# Patient Record
Sex: Female | Born: 1940
Health system: Southern US, Community
[De-identification: ages and names within clinical notes are randomized; demographics above are authoritative.]

## PROBLEM LIST (undated history)

## (undated) DIAGNOSIS — E119 Type 2 diabetes mellitus without complications: Secondary | ICD-10-CM

## (undated) DIAGNOSIS — I739 Peripheral vascular disease, unspecified: Secondary | ICD-10-CM

## (undated) DIAGNOSIS — C50919 Malignant neoplasm of unspecified site of unspecified female breast: Secondary | ICD-10-CM

## (undated) DIAGNOSIS — E785 Hyperlipidemia, unspecified: Secondary | ICD-10-CM

## (undated) DIAGNOSIS — K760 Fatty (change of) liver, not elsewhere classified: Secondary | ICD-10-CM

## (undated) DIAGNOSIS — I214 Non-ST elevation (NSTEMI) myocardial infarction: Secondary | ICD-10-CM

## (undated) DIAGNOSIS — I2510 Atherosclerotic heart disease of native coronary artery without angina pectoris: Secondary | ICD-10-CM

## (undated) DIAGNOSIS — I779 Disorder of arteries and arterioles, unspecified: Secondary | ICD-10-CM

## (undated) DIAGNOSIS — E039 Hypothyroidism, unspecified: Secondary | ICD-10-CM

## (undated) DIAGNOSIS — Q453 Other congenital malformations of pancreas and pancreatic duct: Secondary | ICD-10-CM

## (undated) DIAGNOSIS — I639 Cerebral infarction, unspecified: Secondary | ICD-10-CM

## (undated) DIAGNOSIS — I10 Essential (primary) hypertension: Secondary | ICD-10-CM

## (undated) HISTORY — DX: Peripheral vascular disease, unspecified: I73.9

## (undated) HISTORY — DX: Hypothyroidism, unspecified: E03.9

## (undated) HISTORY — DX: Malignant neoplasm of unspecified site of unspecified female breast: C50.919

## (undated) HISTORY — DX: Disorder of arteries and arterioles, unspecified: I77.9

## (undated) HISTORY — PX: CAROTID ENDARTERECTOMY: SUR193

## (undated) HISTORY — DX: Fatty (change of) liver, not elsewhere classified: K76.0

## (undated) HISTORY — PX: JOINT REPLACEMENT: SHX530

## (undated) HISTORY — DX: Hyperlipidemia, unspecified: E78.5

## (undated) HISTORY — DX: Cerebral infarction, unspecified: I63.9

## (undated) HISTORY — PX: ABDOMINAL HYSTERECTOMY: SHX81

## (undated) HISTORY — DX: Type 2 diabetes mellitus without complications: E11.9

## (undated) HISTORY — PX: CHOLECYSTECTOMY: SHX55

## (undated) HISTORY — PX: MASTECTOMY: SHX3

## (undated) HISTORY — DX: Essential (primary) hypertension: I10

## (undated) HISTORY — DX: Other congenital malformations of pancreas and pancreatic duct: Q45.3

## (undated) HISTORY — DX: Non-ST elevation (NSTEMI) myocardial infarction: I21.4

## (undated) HISTORY — DX: Atherosclerotic heart disease of native coronary artery without angina pectoris: I25.10

---

## 2010-03-19 ENCOUNTER — Inpatient Hospital Stay (HOSPITAL_COMMUNITY)
Admission: AD | Admit: 2010-03-19 | Discharge: 2010-03-22 | DRG: 247 | Disposition: A | Discharge status: Home / Self Care | Payer: Medicare (Managed Care) | Source: Other Acute Inpatient Hospital | Attending: Cardiology | Admitting: Cardiology

## 2010-03-19 DIAGNOSIS — E039 Hypothyroidism, unspecified: Secondary | ICD-10-CM | POA: Diagnosis present

## 2010-03-19 DIAGNOSIS — I739 Peripheral vascular disease, unspecified: Secondary | ICD-10-CM | POA: Diagnosis present

## 2010-03-19 DIAGNOSIS — E119 Type 2 diabetes mellitus without complications: Secondary | ICD-10-CM | POA: Diagnosis present

## 2010-03-19 DIAGNOSIS — I10 Essential (primary) hypertension: Secondary | ICD-10-CM | POA: Diagnosis present

## 2010-03-19 DIAGNOSIS — I5181 Takotsubo syndrome: Secondary | ICD-10-CM | POA: Diagnosis present

## 2010-03-19 DIAGNOSIS — Z853 Personal history of malignant neoplasm of breast: Secondary | ICD-10-CM

## 2010-03-19 DIAGNOSIS — R079 Chest pain, unspecified: Secondary | ICD-10-CM

## 2010-03-19 DIAGNOSIS — I214 Non-ST elevation (NSTEMI) myocardial infarction: Principal | ICD-10-CM | POA: Diagnosis present

## 2010-03-19 DIAGNOSIS — Z8673 Personal history of transient ischemic attack (TIA), and cerebral infarction without residual deficits: Secondary | ICD-10-CM

## 2010-03-19 DIAGNOSIS — E669 Obesity, unspecified: Secondary | ICD-10-CM | POA: Diagnosis present

## 2010-03-19 DIAGNOSIS — I2510 Atherosclerotic heart disease of native coronary artery without angina pectoris: Secondary | ICD-10-CM | POA: Diagnosis present

## 2010-03-19 LAB — CARDIAC PANEL(CRET KIN+CKTOT+MB+TROPI)
CK, MB: 9.7 ng/mL (ref 0.3–4.0)
Relative Index: 3 — ABNORMAL HIGH (ref 0.0–2.5)
Total CK: 326 U/L — ABNORMAL HIGH (ref 7–177)

## 2010-03-20 DIAGNOSIS — I2510 Atherosclerotic heart disease of native coronary artery without angina pectoris: Secondary | ICD-10-CM

## 2010-03-20 DIAGNOSIS — I517 Cardiomegaly: Secondary | ICD-10-CM

## 2010-03-20 LAB — BASIC METABOLIC PANEL
BUN: 16 mg/dL (ref 6–23)
CO2: 24 mEq/L (ref 19–32)
Chloride: 100 mEq/L (ref 96–112)
Creatinine, Ser: 1.04 mg/dL (ref 0.4–1.2)
GFR calc Af Amer: >60 mL/min (ref >60)
Glucose, Bld: 129 mg/dL — ABNORMAL HIGH (ref 70–99)
Sodium: 136 mEq/L (ref 135–145)

## 2010-03-20 LAB — LIPID PANEL
Cholesterol: 179 mg/dL (ref 0–200)
HDL: 40 mg/dL (ref >39)
Total CHOL/HDL Ratio: 4.5 RATIO
VLDL: 21 mg/dL (ref 0–40)

## 2010-03-20 LAB — PROTIME-INR
INR: 1.09 (ref 0.00–1.49)
Prothrombin Time: 14.3 seconds (ref 11.6–15.2)

## 2010-03-20 LAB — CBC
MCH: 25.6 pg — ABNORMAL LOW (ref 26.0–34.0)
MCHC: 32.7 g/dL (ref 30.0–36.0)
Platelets: 193 10*3/uL (ref 150–400)
RBC: 4.22 MIL/uL (ref 3.87–5.11)

## 2010-03-20 LAB — HEPATIC FUNCTION PANEL
Albumin: 3.5 g/dL (ref 3.5–5.2)
Bilirubin, Direct: <0.1 mg/dL (ref 0.0–0.3)
Total Bilirubin: 0.4 mg/dL (ref 0.3–1.2)

## 2010-03-20 LAB — CARDIAC PANEL(CRET KIN+CKTOT+MB+TROPI)
CK, MB: 7.3 ng/mL (ref 0.3–4.0)
Total CK: 269 U/L — ABNORMAL HIGH (ref 7–177)
Troponin I: 1.09 ng/mL (ref 0.00–0.06)

## 2010-03-21 LAB — CBC
Hemoglobin: 11.4 g/dL — ABNORMAL LOW (ref 12.0–15.0)
MCH: 25.9 pg — ABNORMAL LOW (ref 26.0–34.0)
MCV: 78.6 fL (ref 78.0–100.0)
RBC: 4.4 MIL/uL (ref 3.87–5.11)
WBC: 10.7 10*3/uL — ABNORMAL HIGH (ref 4.0–10.5)

## 2010-03-21 LAB — BASIC METABOLIC PANEL
BUN: 22 mg/dL (ref 6–23)
CO2: 27 mEq/L (ref 19–32)
Chloride: 103 mEq/L (ref 96–112)
Creatinine, Ser: 1.2 mg/dL (ref 0.4–1.2)
Potassium: 3.7 mEq/L (ref 3.5–5.1)

## 2010-03-21 NOTE — Procedures (Signed)
NAME:  Robin [name], Robin [name]              ACCOUNT NO.:  [id number]  MEDICAL RECORD NO.:  [id number]           PATIENT TYPE:  O  LOCATION:  [facility]                         FACILITY:  [facility]  PHYSICIAN:  Verne Carrow, MDDATE OF BIRTH:  [birth year]  DATE OF PROCEDURE:  03/20/2010 DATE OF DISCHARGE:                           CARDIAC CATHETERIZATION   PRIMARY CARDIOLOGIST:  Marca Ancona, MD  PROCEDURES PERFORMED: 1. Left heart catheterization. 2. Selective coronary angiography. 3. Left ventricular angiogram. 4. Percutaneous transluminal coronary angioplasty with placement of a     drug-eluting stent in the second diagonal branch of the left     anterior descending artery.  ARTERIAL ACCESS SITE:  Right radial artery.  OPERATOR:  Verne Carrow, MD  INDICATION:  This is a pleasant 70 year old African American female with a history of hypertension, hypercholesterolemia, breast cancer, hypothyroidism, and prior stroke in 2000 who was admitted to the hospital with chest pain.  She ruled in for myocardial infarction with serial cardiac enzymes.  Her peak troponin was 1.3.  Diagnostic catheterization was planned for today.  DETAILS OF PROCEDURE:  The patient was brought to the main cardiac catheterization laboratory after signing informed consent for the procedure.  An Freida Busman test was performed on the right wrist and was positive.  The right wrist was prepped and draped in sterile fashion. 1% lidocaine was used for local anesthesia.  A 5-French sheath was inserted into the right radial artery without difficulty.  3 mg of verapamil was given after sheath insertion.  4000 units of intravenous heparin was given after sheath insertion.  Standard diagnostic catheters were used to perform selective coronary angiography.  A pigtail catheter was used to cross the aortic valve into the left ventricle.  The patient's filling pressures were high and because of this I elected not to do a power  injection for left ventricular angiogram.  I performed a small hand injection with poor opacification of the left ventricle.  A pullback was done across the aortic valve.  There was no significant gradient across the aortic valve.  The patient tolerated the diagnostic portion of the procedure without any difficulties.  At this point, we elected to move toward intervention.  The sheath was upsized to a 6- French sheath in the right radial artery.  The patient was given a bolus of Angiomax and drip was started.  The patient was given 180 mg of Brilinta.  When the ACT was greater than 200, we engaged the left main artery with XB LAD 3.5 guiding catheter.  We then passed a cougar intracoronary wire down the length of the left anterior descending artery and into the second diagonal branch.  A 2.5 x 12-mm PROMUS Element drug-eluting stent was carefully positioned in the second diagonal branch in the area of tightest stenosis and was deployed without difficulty.  A 2.5 x 8-mm noncompliant balloon was carefully positioned inside the stented segment and was inflated to 12 atmospheres.  The stenosis was taken from 99% down to 0%.  There was excellent flow into the diagonal vessel at the conclusion of the case. The sheath was removed from the right radial artery here  in the cath lab.  A Terumo hemostasis band was applied over the arteriotomy site. The patient was taken to the recovery room in stable condition.  There were no immediate complications.  HEMODYNAMIC FINDINGS:  Central aortic pressure 153/70.  Left ventricular pressure 161/26.  Left ventricular end-diastolic pressure 38.  ANGIOGRAPHIC FINDINGS: 1. The left main coronary artery had mild plaque.  This vessel     bifurcated into the circumflex and the left anterior descending     artery. 2. Left anterior descending was a large vessel that coursed to the     apex.  There were mild luminal irregularities throughout the     proximal and  distal vessel.  The midvessel had serial 30% lesions.     First diagonal was a moderate-sized vessel with 30% plaque.  Second     diagonal was a moderate-sized vessel with 99% stenosis in the     midportion of the vessel.  The ostium of this vessel had moderate     40% stenosis. 3. The circumflex artery had mild plaque in the proximal midportion.     There was a small-caliber marginal branch with mild plaque disease.     The second marginal branch had mild plaque disease. 4. The right coronary artery is a large dominant vessel with plaque in     the proximal, mid, and distal portion of the vessel.  The posterior     descending artery had 30% stenosis. 5. Left ventricular angiogram was performed in the RAO projection.     Secondary to high filling pressures, I elected not to do a power     injection.  There was poor opacification of the left ventricle with     hand injection.  The overall ejection fraction appeared to be     normal.  IMPRESSION: 1. Single-vessel coronary artery disease. 2. Non-ST-elevation myocardial infarction. 3. Successful percutaneous transluminal coronary angioplasty with     placement of a drug-eluting stent to the second diagonal branch of     the left anterior descending artery. 4. Overall normal-appearing left ventricular function.  RECOMMENDATIONS:  We will continue the patient on aspirin 81 mg once daily and Brilinta 90 mg twice daily.  She will also be continued on a beta-blocker and a statin.  We will check an echocardiogram today for better assessment of her left ventricular function.     Verne Carrow, MD     CM/MEDQ  D:  03/20/2010  T:  03/20/2010  Job:  [id number]  cc:   Marca Ancona, MD  Electronically Signed by Verne Carrow MD on 03/21/2010 07:15:57 PM

## 2010-03-22 DIAGNOSIS — I214 Non-ST elevation (NSTEMI) myocardial infarction: Secondary | ICD-10-CM

## 2010-03-22 LAB — BASIC METABOLIC PANEL
CO2: 28 mEq/L (ref 19–32)
Calcium: 9.5 mg/dL (ref 8.4–10.5)
Chloride: 102 mEq/L (ref 96–112)
Creatinine, Ser: 1.57 mg/dL — ABNORMAL HIGH (ref 0.4–1.2)
GFR calc Af Amer: 39 mL/min — ABNORMAL LOW (ref >60)
Glucose, Bld: 132 mg/dL — ABNORMAL HIGH (ref 70–99)
Sodium: 139 mEq/L (ref 135–145)

## 2010-03-26 ENCOUNTER — Telehealth: Payer: Self-pay | Admitting: Cardiovascular Disease

## 2010-03-29 NOTE — Discharge Summary (Signed)
Robin [name], TRICE              ACCOUNT NO.:  [id number]  MEDICAL RECORD NO.:  [id number]           PATIENT TYPE:  I  LOCATION:  6533                         FACILITY:  [facility]  PHYSICIAN:  Verne Carrow, MDDATE OF BIRTH:  02/14/1940  DATE OF ADMISSION:  03/19/2010 DATE OF DISCHARGE:  03/22/2010                              DISCHARGE SUMMARY   PRIMARY CARDIOLOGIST:  (New) Jonelle Sidle, MD  PRIMARY CARE PHYSICIAN:  Dr. Hart Robinsons [place], [place]).  DISCHARGE DIAGNOSES: 1. Non-ST-elevation myocardial infarction (multifactorial, coronary     artery disease and likely Takotsubo cardiomyopathy).     a.     Cardiac catheterization, March 10, 2010:  Single-vessel      coronary artery disease, successful percutaneous transluminal      coronary angioplasty and percutaneous coronary intervention with      2.5- x 12-mm Promus Element drug-eluting stent to second diagonal      branch of left anterior descending.  Overall, normal-appearing      left ventricular function.     b.     A 2-D echocardiogram, March 20, 2010:  Left ventricular      cavity size normal, mild left ventricular hypertrophy, moderate-to-      severely reduced left ventricular systolic function with left      ventricular ejection fraction 30% to 35% and mid to apical      anterior, apical septal, apical inferior, mid to apical posterior      segment, and true apex severely hypokinetic to akinetic, grade 1      diastolic dysfunction.  Question Takotsubo picture. 2. Cardiomyopathy (likely nonischemic).  Question Takotsubo. 3. Hypertension (systolic blood pressure 131/193).     a.     Please see final antihypertensive therapy in discharge med      section. 4. Hypothyroidism (TSH 5.545).     a.     Synthroid increased from 125-150 mcg p.o. daily. 5. Hyperlipidemia.     a.     Statin therapy increased from simvastatin 40 mg p.o. daily      to atorvastatin 80 mg p.o. daily in setting of new diagnosis of     coronary artery disease. 6. Borderline diabetes mellitus (HbA1c 6.5%).  SECONDARY DIAGNOSES: 1. History of cerebrovascular accident, 14.     a.     Some residual left-sided weakness. 2. Peripheral vascular disease.     a.     Status post left carotid endarterectomy. 3. History of breast cancer.     a.     Status post bilateral mastectomies. 4. Status post hysterectomy. 5. Status post cholecystectomy. 6. Obesity.  ALLERGIES:  NKDA.  PROCEDURES: 1. EKG, March 19, 2010:  NSR, LVH, profound diffuse T-wave     inversion with prolonged QT interval. 2. Cardiac catheterization, March 20, 2010:  LM mild plaque,     bifurcated into circumflex and LAD.  LAD large vessel, mild luminal     irregularities throughout the proximal and distal vessel, mid     vessel had serial 30% lesions.  D1 moderate sized 30% plaque, D2     moderate sized with 99% stenosis in midportion  of the vessel,     ostium had moderate 40% stenosis.  Circumflex had mild plaque in     the proximal midportion.  Small-caliber OM branch with mild plaque     disease, second OM mild plaque disease.  RCA large and dominant     with plaque in proximal, mid, and distal portion of vessel.  PDA     had 30% stenosis.  Otherwise, see discharge diagnoses section #1     subsection A. 3. A 2-D echocardiogram, March 20, 2010:  Please see discharge     diagnoses section #1 subsection B.  HISTORY OF PRESENT ILLNESS:  Ms. Ryles is a 70 year old African American female with no known history of coronary artery disease but complex history as outlined above who presented to [facility] after having a sensation of tingling in her left arm with profound weakness.  At [facility], she developed severe substernal chest heaviness that lasted approximately 1 hour.  Symptoms relieved with morphine and nitro drip in the emergency department.  Troponin noted to be 0.8 and was subsequently transferred to [facility] for  further eval.  Initial EKG at [facility] showed normal sinus rhythm with nonspecific T-wave changes but repeat tracing at [facility] showed NSR with profound T-wave inversion.  As the patient's symptoms had resolved, she was deemed stable enough for planned diagnostic cardiac catheterization in the a.m.  HOSPITAL COURSE:  The patient was admitted and underwent procedures as described above.  She tolerated them well without significant complications.  The patient's 2-D echocardiogram, wall motion abnormalities could not be fully explained by her coronary artery disease found on cardiac catheterization, and given the pattern raised question of Takotsubo cardiomyopathy.  The patient was treated with Lasix in the hospital but had a significant creatinine bump from 1.04 on morning of March 20, 2010, to peak of 1.57 on date of discharge. Therefore, Lasix is being held on discharge, but she will continue her home hydrochlorothiazide which was held in the hospital.  The patient had hypertension despite aggressive antihypertensive therapy in the hospital and meds were adjusted (please see discharge med section).Given new diagnosis of coronary artery disease, the patient's home statin therapy was increased to max dose Lipitor.  The patient was also noted to have elevated TSH and her home Synthroid therapy was increased slightly.  The patient will continue dual antiplatelet therapy on low- dose aspirin and ticagrelor 90 mg p.o. b.i.d. for which she received 330- day supply with assistance program from a suitable company.  After being deemed stable for discharge by attending cardiologist, Verne Carrow, on the morning of March 22, 2010, she received her new medication list, prescriptions, followup instructions, and postcath instructions.  All questions and concerns were addressed prior to leaving the hospital.  Note, the patient will have basic metabolic panel drawn in 1 week and then will see Dr.  Nona Dell at [facility] office on April 09, 2010, at 3 a.m.  DISCHARGE LABORATORY DATA:  WBC is 10.7, HGB 11.4, HCT 34.6, PLT count is 186,000.  Protime 14.3, INR 1.09.  Sodium 139, potassium 3.9, chloride 102, bicarb 28, BUN 33, and creatinine 1.57.  Glucose 132. Liver function tests within normal limits on date of admission. Magnesium is 2.2.  Hemoglobin A1c 6.5%.  Mean plasma glucose 140. Initial full set of enzymes, CK 326, MB 9.7, troponin 1.37.  Second full set, CK 269, MB 7.3, and troponin of 1.09.  Total cholesterol 179, triglycerides 106, HDL 40, LDL 118.  Total cholesterol/HDL ratio 4.5. TSH 5.545.  FOLLOWUP PLANS AND APPOINTMENTS:  Please see hospital course.  DISCHARGE MEDICATIONS: 1. Atorvastatin 80 mg 1 tablet p.o. at bedtime. 2. Acetaminophen 325 mg 1-2 tablets p.o. q.4 h. p.r.n. 3. Amlodipine 10 mg 1 tablet p.o. daily. 4. Enteric-coated aspirin 81 mg p.o. daily. 5. Carvedilol 12.5 mg 1 tablet p.o. b.i.d. with meals. 6. Levothyroxine 150 mcg 1 tablet p.o. before breakfast, 7. Sublingual nitroglycerin 0.40 mg q.5 minutes up to 3 doses p.r.n.     for chest discomfort. 8. Tacrolimus 90 mg 1 tablet p.o. b.i.d. 9. Diovan/hydrochlorothiazide 160/12.5 mg p.o. daily. 10.Oxybutynin 5 mg 2 tablets p.o. daily. 11.Tamoxifen 20 mg 1 tablet p.o. daily.  Duration of discharge encounter including physician time was 35 minutes.     Jarrett Ables, PAC   ______________________________ Verne Carrow, MD    MS/MEDQ  D:  03/22/2010  T:  03/22/2010  Job:  [id number]  Electronically Signed by Jarrett Ables PAC on 03/26/2010 04:45:29 PM Electronically Signed by Verne Carrow MD on 03/28/2010 04:49:18 PM

## 2010-03-29 NOTE — H&P (Signed)
NAME:  Robin [name], Robin [name]              ACCOUNT NO.:  [id number]  MEDICAL RECORD NO.:  [id number]           PATIENT TYPE:  I  LOCATION:  2921                         FACILITY:  [facility]  PHYSICIAN:  Marca Ancona, MD      DATE OF BIRTH:  14-Jul-1940  DATE OF ADMISSION:  03/19/2010 DATE OF DISCHARGE:                             HISTORY & PHYSICAL   PRIMARY CARE PHYSICIAN:  Dr. Hart Robinsons in [place], [place].  HISTORY PRESENT ILLNESS:  This is a 70 year old with history of stroke status post left carotid endarterectomy, hypertension, and hyperlipidemia who presented with chest pain.  The patient was in her kitchen today washing dishes and felt very weak with tingling in her left arm.  She did not have any lightheadedness or fall.  Given the profound weakness, she went to the emergency department at [facility].  On the way, she developed severe substernal chest heaviness. This lasted in total about 1 hour.  It was finally relieved by morphine and nitroglycerin drip in the emergency department.  Her initial troponin was elevated to 0.18.  Given this finding, she was transferred to [facility] from the emergency department at [facility].  She is currently chest pain-free and has been chest pain-free since leaving [facility].  Initial EKG showed normal sinus rhythm with some nonspecific T-wave changes.  Repeat EKG here at [facility] shows normal sinus rhythm with profound diffuse T-wave inversions.  The patient is of note currently chest pain-free.  MEDICATIONS AT HOME:  Valsartan/hydrochlorothiazide 160/12.5 mg daily, Zocor 40 mg daily, Levoxyl 125 mcg daily, Plavix 75 mg daily, tamoxifen 20 mg daily, oxybutynin 10 mg daily, amlodipine 5 mg daily.  PAST MEDICAL HISTORY: 1. Hypertension. 2. Stroke in 2000.  The patient does have some residual left-sided     weakness.  The patient has had a left carotid endarterectomy. 3. Hypothyroidism. 4. History of breast cancer status post bilateral  mastectomies. 5. Hyperlipidemia. 6. History of hysterectomy. 7. History of cholecystectomy.  SOCIAL HISTORY:  The patient lives in [place] with her husband.  She does not smoke, no alcohol.  Daughter is here with her tonight.  FAMILY HISTORY:  The patient's sister and two brothers have had heart attacks.  REVIEW OF SYSTEMS:  All systems were reviewed and were negative except as noted in history of present illness.  PHYSICAL EXAMINATION:  VITAL SIGNS:  Temperature is 36.7 Celsius, pulse is 66 in sinus rhythm.  Initial blood pressure 176/96, currently 140/71, oxygen saturation 98% on 2 L nasal cannula. GENERAL:  This is a mildly obese female in no distress. HEENT:  Normal exam. ABDOMEN:  Soft, nontender.  No hepatosplenomegaly.  Normal bowel sounds. NECK:  No thyromegaly or thyroid nodule.  There is no JVD. CARDIOVASCULAR:  Heart regular, S1 and S2.  No S3 or S4.  There is no murmur.  There are 1+ posterior tibial pulses bilaterally.  EXTREMITIES: No clubbing or cyanosis.  There is no peripheral edema. LUNGS:  Clear to auscultation bilaterally with normal respiratory effort. SKIN:  Normal exam. NEUROLOGIC:  Alert and oriented x3.  Some slight left arm weakness.  EKG, initially normal sinus rhythm  with left ventricular hypertrophy. Repeat EKG shows normal sinus rhythm, left ventricular hypertrophy and profound diffuse T-wave inversions with a prolonged QT interval.  LABORATORY DATA:  White count 10.4, hematocrit 35.8, platelets 192, potassium 3.7, creatinine 0.91, CK-MB 3.3, troponin 0.18.  IMPRESSION:  This is a 70 year old with history of hypertension, hyperlipidemia, prior stroke who presented to [facility] Emergency Department with chest pain, elevated cardiac enzymes consistent with a non-ST-elevation MI.  She is now chest pain-free.  Her repeat EKG here at [facility] shows diffuse profound T-wave inversions with prolonged QT interval.  Interestingly, her EKG pattern could  also be consistent with a takotsubo cardiomyopathy.  The patient has had really no triggering event for this.  We will plan on continuing her on aspirin, Plavix, starting heparin drip, and continuing her nitroglycerin drip.  She will be started on Lopressor 12.5 mg p.o. q.6 h., this can be titrated as needed.  We will start her on 80 mg of Lipitor, and we will plan a left heart catheterization in the morning as long as she remains chest pain free overnight.     Marca Ancona, MD     DM/MEDQ  D:  03/19/2010  T:  03/20/2010  Job:  [id number]  Electronically Signed by Marca Ancona MD on 03/28/2010 09:18:40 AM

## 2010-04-02 NOTE — Progress Notes (Addendum)
Summary: re meds/questions- CHECK LOCAL PHARMACYS FOR BRILINTA  Phone Note Call from Patient Call back at [phone]   Caller: Daughter/rosa wilson Reason for Call: Talk to Nurse Summary of Call: pt daughter states pt having a hard time getting rx med brlinta. pt daughter states dr Clifton James gave the rx. Initial call taken by: Roe Coombs,  March 26, 2010 3:32 PM  Follow-up for Phone Call        Patient was d/c on Brilinta 90mg  two times a day and none of the pharmacies in [place] have the med. in stock and say that it will take awhile to order it. The patient has been off antiplatelet med. for 2-3 days. She is still taking ASA. I advised her to try some of the [place] pharmacies but I will forward this to Dr. Clifton James to see if he wants to try another medication. Whitney Maeola Sarah RN  March 26, 2010 5:17 PM  pt. aware. I will try to find a pharmacy near them that has Brilinta. Whitney Maeola Sarah RN  March 27, 2010 5:05 PM  Follow-up by: Whitney Maeola Sarah RN,  March 26, 2010 5:17 PM  Additional Follow-up for Phone Call Additional follow up Details #1::        She has to be on antiplatelet therapy with her drug eluting stent. She is not a candidate for Effient with history of stroke. If she cannot get Brilinta, she will need to be started on Plavix. Can we call in Plavix and have her take 300 mg today and then take 75 mg per day. She needs to  let us know if she has any issues getting this. We have samples of Brilinta if she cannot afford the Plavix. cdm Additional Follow-up by: Verne Carrow, MD,  March 27, 2010 4:27 PM    New/Updated Medications: PLAVIX 75 MG TABS (CLOPIDOGREL BISULFATE) Take one tablet by mouth daily Prescriptions: PLAVIX 75 MG TABS (CLOPIDOGREL BISULFATE) Take one tablet by mouth daily  #34 x 1   Entered by:   Whitney Maeola Sarah RN   Authorized by:   Verne Carrow, MD   Signed by:   Ellender Hose RN on 03/27/2010   Method used:   Electronically to        [facility] [address] * (retail)       [address]       [place], [place]  [address]       Ph: [phone] or [phone]       Fax: [phone]   RxID:   [id number]

## 2010-04-04 ENCOUNTER — Encounter: Payer: Self-pay | Admitting: Cardiology

## 2010-04-08 ENCOUNTER — Encounter: Payer: Self-pay | Admitting: Cardiology

## 2010-04-09 ENCOUNTER — Encounter: Payer: Self-pay | Admitting: Cardiology

## 2010-04-09 ENCOUNTER — Ambulatory Visit (INDEPENDENT_AMBULATORY_CARE_PROVIDER_SITE_OTHER): Payer: Medicare (Managed Care) | Admitting: Cardiology

## 2010-04-09 DIAGNOSIS — I10 Essential (primary) hypertension: Secondary | ICD-10-CM

## 2010-04-09 DIAGNOSIS — N289 Disorder of kidney and ureter, unspecified: Secondary | ICD-10-CM

## 2010-04-09 DIAGNOSIS — E782 Mixed hyperlipidemia: Secondary | ICD-10-CM

## 2010-04-09 DIAGNOSIS — I429 Cardiomyopathy, unspecified: Secondary | ICD-10-CM

## 2010-04-09 DIAGNOSIS — I2510 Atherosclerotic heart disease of native coronary artery without angina pectoris: Secondary | ICD-10-CM

## 2010-04-09 DIAGNOSIS — Z79899 Other long term (current) drug therapy: Secondary | ICD-10-CM

## 2010-04-09 DIAGNOSIS — I214 Non-ST elevation (NSTEMI) myocardial infarction: Secondary | ICD-10-CM

## 2010-04-09 MED ORDER — VALSARTAN 80 MG PO TABS: 80.0000 mg | ORAL_TABLET | Freq: Every day | ORAL | Status: DC

## 2010-04-09 NOTE — Assessment & Plan Note (Signed)
Followup lab work shows improvement in creatinine, normal potassium.

## 2010-04-09 NOTE — Patient Instructions (Signed)
Follow up with Dr. Diona Browner in 6 weeks as planned.  Start Valsartan 80mg  by mouth once daily. Your physician recommends that you go to the [facility] for lab work: [facility]. DO LABS IN 2 WEEKS.  We will make cardiac rehab in [place].

## 2010-04-09 NOTE — Assessment & Plan Note (Signed)
PossibleTakotsubo cardiomyopathy based on most recent echocardiogram, LVEF 30-35%. Plan to reassess on medical therapy over time.

## 2010-04-09 NOTE — Assessment & Plan Note (Signed)
Clinically stable, continue present medical regimen as noted. Followup scheduled for one month.

## 2010-04-09 NOTE — Assessment & Plan Note (Signed)
Blood pressure is not well controlled. Patient was previously on valsartan hydrochlorothiazide prior to recent admission. Plan will be to resume valsartan 80 mg daily, followup BMET in 2 weeks. This can be further titrated as tolerated

## 2010-04-09 NOTE — Assessment & Plan Note (Signed)
Recent lipid profile noted above, continue present medical therapy.

## 2010-04-09 NOTE — Progress Notes (Signed)
Clinical Summary Robin [name] is a 70 y.o.female presenting to the office for post hospital followup. This is my first meeting with her. Records indicate that she presented in late February with symptoms concerning for an acute coronary syndrome. She underwent cardiac catheterization at [facility] on 29 February, anatomy outlined below. She underwent placement of a drug-eluting stent within the second diagonal branch, and had additional evidence of a cardiomyopathy, LVEF of 30-35%, with apical segmental wall motion abnormality consistent with possibleTakotsubo cardiomyopathy.  Lab work as of 2 March showed potassium 3.9, BUN 33, creatinine 1.5. Lipid profile showed cholesterol 179, triglycerides 106, HDL 40, LDL 118. Followup lab work done just recently on 15 March showed sodium 137, potassium 4.3, BUN 43, creatinine 1.2.  Patient is here for followup with her daughter. She states that she is feeling relatively well, no recent chest pain, and stable dyspnea on exertion. We discussed considering cardiac rehabilitation in [place]. She reports compliance with her medications.  We discussed the results of her cardiac evaluation at [facility], the implications, and plan for continuing medical therapy with followup assessment of LVEF over time.  No Known Allergies  Current Outpatient Prescriptions on File Prior to Visit  Medication Sig Dispense Refill  . amLODipine (NORVASC) 10 MG tablet Take 10 mg by mouth daily.        Marland Kitchen aspirin 81 MG tablet Take 81 mg by mouth daily.        Marland Kitchen atorvastatin (LIPITOR) 80 MG tablet Take 80 mg by mouth daily.        . carvedilol (COREG) 12.5 MG tablet Take 12.5 mg by mouth 2 (two) times daily with a meal.        . levothyroxine (SYNTHROID, LEVOTHROID) 150 MCG tablet Take 150 mcg by mouth daily.        . nitroGLYCERIN (NITROSTAT) 0.4 MG SL tablet Place 0.4 mg under the tongue every 5 (five) minutes as needed.        Marland Kitchen oxybutynin (DITROPAN) 5 MG tablet Take 2 by mouth  daily        . acetaminophen (TYLENOL) 325 MG tablet Take 650 mg by mouth every 6 (six) hours as needed.        Marland Kitchen DISCONTD: Ticagrelor 90 MG TABS Take one by mouth twice daily        . DISCONTD: valsartan-hydrochlorothiazide (DIOVAN-HCT) 160-12.5 MG per tablet Take 1 tablet by mouth daily.          Past Medical History  Diagnosis Date  . NSTEMI (non-ST elevated myocardial infarction)     2/12  . Cardiomyopathy     Probable Takotsubo, LVEF 30-35% 2/12  . HTN (hypertension)   . Hypothyroidism   . Hyperlipidemia   . Diabetes mellitus   . Stroke     (2000) residual left-sided weakness  . Carotid artery disease   . Breast cancer   . CAD (coronary artery disease)     DES second diagonal 2/12    Social History Robin [name] reports that she has never smoked. She has never used smokeless tobacco. Robin [name] reports that she does not drink alcohol.  Review of Systems No fevers, chills, or unusual weight change. No chest pain, progressive shortness of breath, cough, hemoptysis, or wheezing. No palpitations, dizziness, or syncope. Stable appetite with no abdominal pain, melena, or hematochezia. No orthopnea, PND, or lower extremity edema. Otherwise systems reviewed and negative except as already outlined.  Physical Examination Filed Vitals:   04/09/10 1501  BP: 161/87  Pulse: 64  Morbidly obese woman in no acute distress. HEENT: Conjunctiva and lids normal, oropharynx clear with moist mucosa. Neck: Supple, no elevated JVP or carotid bruits, no thyromegaly. Lungs: Clear to auscultation, nonlabored breathing at rest. Cardiac: Regular rate and rhythm, no S3, 2/6 systolic murmur, no pericardial rub. Abdomen: Soft, nontender, no hepatomegaly, bowel sounds present, no guarding or rebound. Extremities: No pitting edema, distal pulses 2+, right radial pulse stable following catheterization. Skin: Warm and dry. Musculoskeletal: No kyphosis. Neuropsychiatric: Alert and oriented x3, affect  grossly appropriate.  ECG Normal sinus rhythm at 64 beats per minute with inferior and anterolateral T wave inversions consistent with recent ischemic event and cardiomyopathy.  Studies ANGIOGRAPHIC FINDINGS:   1. The left main coronary artery had mild plaque.  This vessel       bifurcated into the circumflex and the left anterior descending       artery.   2. Left anterior descending was a large vessel that coursed to the       apex.  There were mild luminal irregularities throughout the       proximal and distal vessel.  The midvessel had serial 30% lesions.       First diagonal was a moderate-sized vessel with 30% plaque.  Second       diagonal was a moderate-sized vessel with 99% stenosis in the       midportion of the vessel.  The ostium of this vessel had moderate       40% stenosis.   3. The circumflex artery had mild plaque in the proximal midportion.       There was a small-caliber marginal branch with mild plaque disease.       The second marginal branch had mild plaque disease.   4. The right coronary artery is a large dominant vessel with plaque in       the proximal, mid, and distal portion of the vessel.  The posterior       descending artery had 30% stenosis.   5. Left ventricular angiogram was performed in the RAO projection.       Secondary to high filling pressures, I elected not to do a power       injection.  There was poor opacification of the left ventricle with       hand injection.  The overall ejection fraction appeared to be       normal.  Problem List and Plan

## 2010-04-09 NOTE — Assessment & Plan Note (Signed)
Status post NSTEMI, clinically stable at this point. Recent drug-eluting stent placement to the second diagonal, tolerating dual antiplatelet therapy. She is on aspirin and Plavix at this point. Referral to cardiac rehabilitation is made.

## 2010-04-10 ENCOUNTER — Other Ambulatory Visit: Payer: Self-pay | Admitting: *Deleted

## 2010-04-10 DIAGNOSIS — I214 Non-ST elevation (NSTEMI) myocardial infarction: Secondary | ICD-10-CM

## 2010-04-16 ENCOUNTER — Encounter: Payer: Self-pay | Admitting: Cardiology

## 2010-05-02 ENCOUNTER — Encounter: Payer: Self-pay | Admitting: *Deleted

## 2010-05-21 ENCOUNTER — Ambulatory Visit: Payer: Medicare (Managed Care) | Admitting: Cardiology

## 2010-06-06 ENCOUNTER — Other Ambulatory Visit: Payer: Self-pay | Admitting: Cardiovascular Disease

## 2010-06-11 ENCOUNTER — Other Ambulatory Visit: Payer: Self-pay | Admitting: Cardiovascular Disease

## 2010-06-15 ENCOUNTER — Other Ambulatory Visit: Payer: Self-pay | Admitting: Cardiovascular Disease

## 2010-06-20 ENCOUNTER — Encounter: Payer: Self-pay | Admitting: *Deleted

## 2010-06-20 NOTE — Telephone Encounter (Signed)
Ask primary MD for a refill on this med

## 2010-06-21 ENCOUNTER — Ambulatory Visit (INDEPENDENT_AMBULATORY_CARE_PROVIDER_SITE_OTHER): Payer: Medicare (Managed Care) | Admitting: Cardiology

## 2010-06-21 ENCOUNTER — Encounter: Payer: Self-pay | Admitting: Cardiology

## 2010-06-21 VITALS — BP 211/99 | HR 66 | Ht 67.0 in | Wt 225.4 lb

## 2010-06-21 DIAGNOSIS — I429 Cardiomyopathy, unspecified: Secondary | ICD-10-CM

## 2010-06-21 DIAGNOSIS — Z79899 Other long term (current) drug therapy: Secondary | ICD-10-CM

## 2010-06-21 DIAGNOSIS — I2510 Atherosclerotic heart disease of native coronary artery without angina pectoris: Secondary | ICD-10-CM

## 2010-06-21 DIAGNOSIS — I10 Essential (primary) hypertension: Secondary | ICD-10-CM

## 2010-06-21 MED ORDER — ATORVASTATIN CALCIUM 80 MG PO TABS: 80.0000 mg | ORAL_TABLET | Freq: Every day | ORAL | Status: DC

## 2010-06-21 MED ORDER — CLOPIDOGREL BISULFATE 75 MG PO TABS: 75.0000 mg | ORAL_TABLET | Freq: Every day | ORAL | Status: DC

## 2010-06-21 MED ORDER — VALSARTAN 160 MG PO TABS: 160.0000 mg | ORAL_TABLET | Freq: Every day | ORAL | Status: DC

## 2010-06-21 MED ORDER — AMLODIPINE BESYLATE 10 MG PO TABS: 10.0000 mg | ORAL_TABLET | Freq: Every day | ORAL | Status: DC

## 2010-06-21 NOTE — Assessment & Plan Note (Signed)
Relatively stable overall, plan to continue medical therapy. She does plan to go back for more cardiac rehabilitation.

## 2010-06-21 NOTE — Progress Notes (Signed)
Clinical Summary Ms. Ganesh is a 70 y.o.female presenting for followup. She was seen in March for a post hospital visit.  At that time we resumed valsartan with plan for followup BMET. She did not get her blood work done, citing 2 deaths in the family within the last month and significant stress.  She has intermittent episodes of chest pain during this time of stress, although generally feels "okay." She had even started cardiac rehabilitation and was enjoying it.  Blood pressure is elevated today. She reports compliance with her medications.   No Known Allergies  Current outpatient prescriptions:amLODipine (NORVASC) 10 MG tablet, Take 1 tablet (10 mg total) by mouth daily., Disp: 30 tablet, Rfl: 6;  aspirin 81 MG tablet, Take 81 mg by mouth daily.  , Disp: , Rfl: ;  atorvastatin (LIPITOR) 80 MG tablet, Take 1 tablet (80 mg total) by mouth daily., Disp: 30 tablet, Rfl: 6;  carvedilol (COREG) 12.5 MG tablet, Take 12.5 mg by mouth 2 (two) times daily with a meal.  , Disp: , Rfl:  clopidogrel (PLAVIX) 75 MG tablet, Take 1 tablet (75 mg total) by mouth daily., Disp: 30 tablet, Rfl: 6;  levothyroxine (SYNTHROID, LEVOTHROID) 150 MCG tablet, Take 150 mcg by mouth daily.  , Disp: , Rfl: ;  oxybutynin (DITROPAN) 5 MG tablet, Take 2 by mouth daily  , Disp: , Rfl: ;  tamoxifen (NOLVADEX) 20 MG tablet, Take 20 mg by mouth daily.  , Disp: , Rfl:  DISCONTD: amLODipine (NORVASC) 10 MG tablet, TAKE ONE TABLET BY MOUTH EVERY DAY, Disp: 30 tablet, Rfl: 12;  DISCONTD: atorvastatin (LIPITOR) 80 MG tablet, Take 80 mg by mouth daily.  , Disp: , Rfl: ;  DISCONTD: clopidogrel (PLAVIX) 75 MG tablet, Take 75 mg by mouth daily.  , Disp: , Rfl: ;  DISCONTD: valsartan (DIOVAN) 80 MG tablet, Take 1 tablet (80 mg total) by mouth daily., Disp: 30 tablet, Rfl: 6 nitroGLYCERIN (NITROSTAT) 0.4 MG SL tablet, Place 0.4 mg under the tongue every 5 (five) minutes as needed.  , Disp: , Rfl: ;  valsartan (DIOVAN) 160 MG tablet, Take 1  tablet (160 mg total) by mouth daily., Disp: 30 tablet, Rfl: 6;  DISCONTD: acetaminophen (TYLENOL) 325 MG tablet, Take 650 mg by mouth every 6 (six) hours as needed.  , Disp: , Rfl:   Past Medical History  Diagnosis Date  . NSTEMI (non-ST elevated myocardial infarction)     2/12  . Cardiomyopathy     Probable Takotsubo, LVEF 30-35% 2/12  . HTN (hypertension)   . Hypothyroidism   . Hyperlipidemia   . Diabetes mellitus   . Stroke     (2000) residual left-sided weakness  . Carotid artery disease   . Breast cancer   . CAD (coronary artery disease)     DES second diagonal 2/12    Past Surgical History  Procedure Date  . Carotid endarterectomy     Left  . Mastectomy     Bilateral  . Abdominal hysterectomy   . Cholecystectomy     Family History  Problem Relation Age of Onset  . Heart attack Sister   . Heart attack Brother     Social History Ms. Justo reports that she has never smoked. She has never used smokeless tobacco. Ms. Lindamood reports that she does not drink alcohol.  Review of Systems No palpitations or syncope. No lower extremity edema.  Physical Examination Filed Vitals:   06/21/10 1442  BP: 211/99  Pulse: 66  Morbidly obese woman in no acute distress.  HEENT: Conjunctiva and lids normal, oropharynx clear with moist mucosa.  Neck: Supple, no elevated JVP or carotid bruits, no thyromegaly.  Lungs: Clear to auscultation, nonlabored breathing at rest.  Cardiac: Regular rate and rhythm, no S3, 2/6 systolic murmur, no pericardial rub.  Abdomen: Soft, nontender, no hepatomegaly, bowel sounds present, no guarding or rebound.  Extremities: No pitting edema, distal pulses 2+, right radial pulse stable following catheterization.  Skin: Warm and dry.  Musculoskeletal: No kyphosis.  Neuropsychiatric: Alert and oriented x3, affect grossly appropriate.    Problem List and Plan

## 2010-06-21 NOTE — Assessment & Plan Note (Signed)
Possible Tako-tsubo presentation as described previously. A followup 2-D echocardiogram will be obtained prior to her next visit.

## 2010-06-21 NOTE — Assessment & Plan Note (Signed)
Increase valsartan to 160 mg daily. Followup BMET in one week.

## 2010-06-21 NOTE — Patient Instructions (Addendum)
Follow up as scheduled. Your physician recommends that you continue on your current medications as directed. Please refer to the Current Medication list given to you today. Increase Diovan to 160mg  daily. Your physician recommends that you go to the [facility] for lab work: [employer]. DO LAB WORK IN 1 WEEK. Your physician has requested that you have an echocardiogram. Echocardiography is a painless test that uses sound waves to create images of your heart. It provides your doctor with information about the size and shape of your heart and how well your heart's chambers and valves are working. This procedure takes approximately one hour. There are no restrictions for this procedure. DO IN 6 WEEKS BEFORE OFFICE VISIT.

## 2010-08-01 ENCOUNTER — Encounter: Payer: Self-pay | Admitting: Cardiology

## 2010-08-05 DIAGNOSIS — R072 Precordial pain: Secondary | ICD-10-CM

## 2010-08-08 ENCOUNTER — Encounter: Payer: Self-pay | Admitting: Cardiology

## 2010-08-08 ENCOUNTER — Ambulatory Visit (INDEPENDENT_AMBULATORY_CARE_PROVIDER_SITE_OTHER): Payer: Medicare (Managed Care) | Admitting: Physician Assistant

## 2010-08-08 DIAGNOSIS — I10 Essential (primary) hypertension: Secondary | ICD-10-CM

## 2010-08-08 DIAGNOSIS — I429 Cardiomyopathy, unspecified: Secondary | ICD-10-CM

## 2010-08-08 DIAGNOSIS — N289 Disorder of kidney and ureter, unspecified: Secondary | ICD-10-CM

## 2010-08-08 NOTE — Assessment & Plan Note (Addendum)
Patient presents with no interval history or clinical evidence of decompensated heart failure. Recent f/u 2-D echo indicates normalization of LVF (Ef 60-65%), with no significant valvular abnormalities.

## 2010-08-08 NOTE — Assessment & Plan Note (Signed)
Much improved, following recent medication adjustments

## 2010-08-08 NOTE — Assessment & Plan Note (Signed)
Stable by recent f/u labs, following initial increase in Diovan, by Dr. Diona Browner, and subsequent change to Diovan HCTZ, by Dr. Hart Robinsons.

## 2010-08-08 NOTE — Patient Instructions (Addendum)
Your physician wants you to follow-up in: 4 months. You will receive a reminder letter in the mail one-two months in advance. If you don't receive a letter, please call our office to schedule the follow-up appointment. Your physician recommends that you continue on your current medications as directed. Please refer to the Current Medication list given to you today. We will notify you of your Echo results when available.

## 2010-08-08 NOTE — Progress Notes (Signed)
HPI: Patient presents for early scheduled followup.  When last seen, Diovan was increased for more aggressive blood pressure control (211/99), with followup stable labs. She subsequently had further adjustment, by Dr. Hart Robinsons, with change to Diovan HCTZ, again followed by stable labs.  A 2-D echo was also ordered by Dr. Diona Browner, for reassessment of probable Takotsubo Cardiomyopathy (EF 30-35%), 2/12. This recent study indicated normalization of LV function (EF 60-65%).  Clinically, the patient feels much better. She denies any significant DOE, orthopnea, PND, or worsening LE edema. She also denies any exertional CP.  No Known Allergies  Current Outpatient Prescriptions on File Prior to Visit  Medication Sig Dispense Refill  . amLODipine (NORVASC) 10 MG tablet Take 1 tablet (10 mg total) by mouth daily.  30 tablet  6  . aspirin 81 MG tablet Take 81 mg by mouth daily.        Marland Kitchen atorvastatin (LIPITOR) 80 MG tablet Take 1 tablet (80 mg total) by mouth daily.  30 tablet  6  . carvedilol (COREG) 12.5 MG tablet Take 12.5 mg by mouth 2 (two) times daily with a meal.        . clopidogrel (PLAVIX) 75 MG tablet Take 1 tablet (75 mg total) by mouth daily.  30 tablet  6  . nitroGLYCERIN (NITROSTAT) 0.4 MG SL tablet Place 0.4 mg under the tongue every 5 (five) minutes as needed.        . tamoxifen (NOLVADEX) 20 MG tablet Take 20 mg by mouth daily.          Past Medical History  Diagnosis Date  . NSTEMI (non-ST elevated myocardial infarction)     2/12  . Cardiomyopathy     Probable Takotsubo, LVEF 30-35% 2/12  . HTN (hypertension)   . Hypothyroidism   . Hyperlipidemia   . Diabetes mellitus   . Stroke     (2000) residual left-sided weakness  . Carotid artery disease   . Breast cancer   . CAD (coronary artery disease)     DES second diagonal 2/12    Past Surgical History  Procedure Date  . Carotid endarterectomy     Left  . Mastectomy     Bilateral  . Abdominal hysterectomy   .  Cholecystectomy     History   Social History  . Marital Status: Married    Spouse Name: N/A    Number of Children: N/A  . Years of Education: N/A   Occupational History  . Not on file.   Social History Main Topics  . Smoking status: Never Smoker   . Smokeless tobacco: Never Used  . Alcohol Use: No  . Drug Use: No  . Sexually Active: Not on file   Other Topics Concern  . Not on file   Social History Narrative  . No narrative on file    Family History  Problem Relation Age of Onset  . Heart attack Sister   . Heart attack Brother     ROS: Negative for exertional chest pain, DOE, orthopnea, PND, lower extremity edema, palpitations, presyncope/syncope, claudication, reflux, hematuria, hematochezia, or melena. Remaining systems reviewed, and are negative.   PHYSICAL EXAM:  BP 145/74  Pulse 60  Ht 5\' 7"  (1.702 m)  Wt 224 lb (101.606 kg)  BMI 35.08 kg/m2  GENERAL: well-nourished, well-developed; NAD HEENT: NCAT, PERRLA, EOMI; sclera clear; no xanthelasma NECK: palpable bilateral carotid pulses, Bilateral bruits; no JVD; no TM LUNGS: CTA bilaterally CARDIAC: RRR (S1, S2); no significant murmurs; no rubs  or gallops ABDOMEN: Protuberant EXTREMETIES: intact distal pulses; no significant peripheral edema SKIN: warm/dry; no obvious rash/lesions MUSCULOSKELETAL: no joint deformity NEURO: no focal deficit; NL affect   EKG:    ASSESSMENT & PLAN:

## 2010-09-17 ENCOUNTER — Encounter: Payer: Self-pay | Admitting: Cardiology

## 2010-09-17 ENCOUNTER — Ambulatory Visit (INDEPENDENT_AMBULATORY_CARE_PROVIDER_SITE_OTHER): Payer: Medicare (Managed Care) | Admitting: Cardiology

## 2010-09-17 VITALS — BP 139/66 | HR 67 | Ht 68.0 in | Wt 229.0 lb

## 2010-09-17 DIAGNOSIS — I429 Cardiomyopathy, unspecified: Secondary | ICD-10-CM

## 2010-09-17 DIAGNOSIS — R609 Edema, unspecified: Secondary | ICD-10-CM | POA: Insufficient documentation

## 2010-09-17 DIAGNOSIS — I2510 Atherosclerotic heart disease of native coronary artery without angina pectoris: Secondary | ICD-10-CM

## 2010-09-17 DIAGNOSIS — I10 Essential (primary) hypertension: Secondary | ICD-10-CM

## 2010-09-17 MED ORDER — AMLODIPINE BESYLATE 10 MG PO TABS: 5.0000 mg | ORAL_TABLET | Freq: Every day | ORAL | Status: DC

## 2010-09-17 MED ORDER — VALSARTAN-HYDROCHLOROTHIAZIDE 160-25 MG PO TABS: 1.0000 | ORAL_TABLET | Freq: Every day | ORAL | Status: DC

## 2010-09-17 NOTE — Assessment & Plan Note (Signed)
Recent reassessment of LVEF shows normal function. Edema may be related to Norvasc. Plan to reduce Norvasc to 5 mg daily. Compression hose would be another consideration.

## 2010-09-17 NOTE — Progress Notes (Signed)
Clinical Summary Robin [name] is a 70 y.o.female presenting for followup. She was seen recently in July by Mr. Serpe. Interval echocardiogram demonstrated subsequent normalization of LV function.  Lab work in June showed BUN 17, creatinine 0.9, sodium 138, potassium 3.7.  She presents complaining of progressive lower extremity edema, mainly around the ankles. This resolves completely overnight, gets worse when she is back on her feet during the day time. Medications were reviewed, including Norvasc which may be a culprit. This has not improved significantly on low-dose HCTZ as a component of her valsartan.  Otherwise she states that she feels "good." No progressive shortness of breath or chest pain.   No Known Allergies  Medication list reviewed.  Past Medical History  Diagnosis Date  . NSTEMI (non-ST elevated myocardial infarction)     2/12  . Cardiomyopathy     Probable Takotsubo, LVEF 30-35% 2/12  . HTN (hypertension)   . Hypothyroidism   . Hyperlipidemia   . Diabetes mellitus   . Stroke     (2000) residual left-sided weakness  . Carotid artery disease   . Breast cancer   . CAD (coronary artery disease)     DES second diagonal 2/12    Past Surgical History  Procedure Date  . Carotid endarterectomy     Left  . Mastectomy     Bilateral  . Abdominal hysterectomy   . Cholecystectomy     Family History  Problem Relation Age of Onset  . Heart attack Sister   . Heart attack Brother     Social History Robin [name] reports that she has never smoked. She has never used smokeless tobacco. Robin [name] reports that she does not drink alcohol.  Review of Systems No palpitations. No reported bleeding problems.  Physical Examination Filed Vitals:   09/17/10 0955  BP: 139/66  Pulse: 67   Morbidly obese woman in no acute distress.  HEENT: Conjunctiva and lids normal, oropharynx clear with moist mucosa.  Neck: Supple, no elevated JVP or carotid bruits, no  thyromegaly.  Lungs: Clear to auscultation, nonlabored breathing at rest.  Cardiac: Regular rate and rhythm, no S3, 2/6 systolic murmur, no pericardial rub.  Abdomen: Soft, nontender, no hepatomegaly, bowel sounds present, no guarding or rebound.  Extremities: 1+ ankle edema bilaterally.  Skin: Warm and dry.  Musculoskeletal: No kyphosis.  Neuropsychiatric: Alert and oriented x3, affect grossly appropriate.    Problem List and Plan

## 2010-09-17 NOTE — Assessment & Plan Note (Signed)
Clinically stable on medical therapy.

## 2010-09-17 NOTE — Assessment & Plan Note (Signed)
Since we are reducing Norvasc, plan to change valsartan HCTZ to 160/25 mg daily. Continue to follow blood pressure.

## 2010-09-17 NOTE — Patient Instructions (Signed)
Follow up as scheduled. Decrease Norvasc (amlodipine) to 5 mg (1/2 of your 10 mg tablets) daily. Increase Diovan HCT to 160/25 mg daily. Start new prescription. Your physician recommends that you go to the [facility] for lab work: [employer]. Do lab a few days before next office visit. Call the office before going to do this lab work so that we can fax an order to the [facility].

## 2010-09-17 NOTE — Assessment & Plan Note (Signed)
History of Tako-tsubo cardiomyopathy with normalization of LV function.

## 2010-10-08 ENCOUNTER — Other Ambulatory Visit: Payer: Self-pay | Admitting: Cardiology

## 2010-10-08 ENCOUNTER — Encounter: Payer: Self-pay | Admitting: Cardiology

## 2010-12-02 ENCOUNTER — Other Ambulatory Visit: Payer: Self-pay | Admitting: *Deleted

## 2010-12-02 DIAGNOSIS — N289 Disorder of kidney and ureter, unspecified: Secondary | ICD-10-CM

## 2010-12-05 ENCOUNTER — Encounter: Payer: Self-pay | Admitting: Cardiology

## 2010-12-10 ENCOUNTER — Ambulatory Visit: Payer: Medicare (Managed Care) | Admitting: Cardiology

## 2010-12-17 ENCOUNTER — Ambulatory Visit (INDEPENDENT_AMBULATORY_CARE_PROVIDER_SITE_OTHER): Payer: Medicare (Managed Care) | Admitting: Cardiology

## 2010-12-17 ENCOUNTER — Encounter: Payer: Self-pay | Admitting: Cardiology

## 2010-12-17 VITALS — BP 181/81 | HR 89 | Ht 68.0 in | Wt 232.0 lb

## 2010-12-17 DIAGNOSIS — I429 Cardiomyopathy, unspecified: Secondary | ICD-10-CM

## 2010-12-17 DIAGNOSIS — I2510 Atherosclerotic heart disease of native coronary artery without angina pectoris: Secondary | ICD-10-CM

## 2010-12-17 DIAGNOSIS — I10 Essential (primary) hypertension: Secondary | ICD-10-CM

## 2010-12-17 DIAGNOSIS — R609 Edema, unspecified: Secondary | ICD-10-CM

## 2010-12-17 DIAGNOSIS — E782 Mixed hyperlipidemia: Secondary | ICD-10-CM

## 2010-12-17 MED ORDER — VALSARTAN-HYDROCHLOROTHIAZIDE 320-25 MG PO TABS: 1.0000 | ORAL_TABLET | Freq: Every day | ORAL | Status: DC

## 2010-12-17 NOTE — Assessment & Plan Note (Signed)
History of Tako-tsubo cardiomyopathy with normalization of LV function on medical therapy.

## 2010-12-17 NOTE — Assessment & Plan Note (Signed)
Continues on statin therapy. 

## 2010-12-17 NOTE — Assessment & Plan Note (Signed)
Not optimally controlled. Plan to increase Diovan HCT to 320/25 mg taken in the evening. Followup BMET for next visit.

## 2010-12-17 NOTE — Assessment & Plan Note (Signed)
Reportedly better following reduction in Norvasc to 5 mg daily.

## 2010-12-17 NOTE — Patient Instructions (Signed)
   Increase Diovan/HCTZ to 320/25mg  - take at night  BMET (lab) just before next office visit Your physician wants you to follow up in: 6 months.  You will receive a reminder letter in the mail one-two months in advance.  If you don't receive a letter, please call our office to schedule the follow up appointment

## 2010-12-17 NOTE — Progress Notes (Signed)
Clinical Summary Robin [name] is a 70 y.o.female presenting for followup. She was seen in August.  She states that she has been feeling fairly well, no angina or unusual shortness of breath. She brought in 2 separate collections of medications, has been hesitant to take the new medication she was prescribed by her primary care physician. We reviewed these and discussed optimization of her blood pressure control. She states that generally her leg edema has been better since reducing Norvasc 5 mg daily.  No palpitations, dizziness, syncope. No orthopnea or PND.  No Known Allergies  Medication list reviewed.  Past Medical History  Diagnosis Date  . NSTEMI (non-ST elevated myocardial infarction)     2/12  . Cardiomyopathy     Probable Takotsubo, LVEF 30-35% 2/12  . HTN (hypertension)   . Hypothyroidism   . Hyperlipidemia   . Diabetes mellitus   . Stroke     (2000) residual left-sided weakness  . Carotid artery disease   . Breast cancer   . CAD (coronary artery disease)     DES second diagonal 2/12    Past Surgical History  Procedure Date  . Carotid endarterectomy     Left  . Mastectomy     Bilateral  . Abdominal hysterectomy   . Cholecystectomy     Family History  Problem Relation Age of Onset  . Heart attack Sister   . Heart attack Brother     Social History Robin [name] reports that she has never smoked. She has never used smokeless tobacco. Robin [name] reports that she does not drink alcohol.  Review of Systems As outlined above, otherwise negative.  Physical Examination Filed Vitals:   12/17/10 1041  BP: 181/81  Pulse: 89    Morbidly obese woman in no acute distress.  HEENT: Conjunctiva and lids normal, oropharynx clear with moist mucosa.  Neck: Supple, no elevated JVP or carotid bruits, no thyromegaly.  Lungs: Clear to auscultation, nonlabored breathing at rest.  Cardiac: Regular rate and rhythm, no S3, 2/6 systolic murmur, no pericardial rub.    Abdomen: Soft, nontender, no hepatomegaly, bowel sounds present, no guarding or rebound.  Extremities: 1+ ankle edema bilaterally.  Skin: Warm and dry.  Musculoskeletal: No kyphosis.  Neuropsychiatric: Alert and oriented x3, affect grossly appropriate.    Problem List and Plan

## 2010-12-17 NOTE — Assessment & Plan Note (Signed)
No active angina, nonobstructive disease by catheterization. Continue medical therapy and observation.

## 2011-01-22 ENCOUNTER — Telehealth: Payer: Self-pay | Admitting: Cardiology

## 2011-01-22 NOTE — Telephone Encounter (Signed)
LOv x2, Labs faxed to [facility] @ [phone] 01/22/11/KM

## 2011-01-29 ENCOUNTER — Telehealth: Payer: Self-pay | Admitting: *Deleted

## 2011-01-29 NOTE — Telephone Encounter (Signed)
Nurse called patient to inquire why she filled the diovan/hct 160/25 since she was increased to 320/25mg  on 12/17/10. Patient stated she went back to the old dose because the increased dose made her feel nausea,dizzy and gave her a headache. Patient stated she did call office and informed staff that she did this but couldn't remember who she talked to.

## 2011-03-22 ENCOUNTER — Other Ambulatory Visit: Payer: Self-pay | Admitting: Cardiology

## 2011-04-08 ENCOUNTER — Other Ambulatory Visit: Payer: Self-pay

## 2011-04-09 ENCOUNTER — Encounter: Payer: Self-pay | Admitting: Internal Medicine

## 2011-04-09 DIAGNOSIS — R0602 Shortness of breath: Secondary | ICD-10-CM

## 2011-04-09 DIAGNOSIS — R079 Chest pain, unspecified: Secondary | ICD-10-CM

## 2011-04-09 DIAGNOSIS — I5030 Unspecified diastolic (congestive) heart failure: Secondary | ICD-10-CM

## 2011-04-10 ENCOUNTER — Encounter: Payer: Self-pay | Admitting: Physician Assistant

## 2011-04-21 ENCOUNTER — Encounter: Payer: Self-pay | Admitting: Cardiovascular Disease

## 2011-04-21 DIAGNOSIS — I2510 Atherosclerotic heart disease of native coronary artery without angina pectoris: Secondary | ICD-10-CM

## 2011-04-24 ENCOUNTER — Encounter: Payer: Self-pay | Admitting: Physician Assistant

## 2011-04-24 ENCOUNTER — Ambulatory Visit (INDEPENDENT_AMBULATORY_CARE_PROVIDER_SITE_OTHER): Payer: PRIVATE HEALTH INSURANCE | Admitting: Physician Assistant

## 2011-04-24 VITALS — BP 188/80 | HR 64 | Ht 68.0 in | Wt 226.0 lb

## 2011-04-24 DIAGNOSIS — I779 Disorder of arteries and arterioles, unspecified: Secondary | ICD-10-CM

## 2011-04-24 DIAGNOSIS — I429 Cardiomyopathy, unspecified: Secondary | ICD-10-CM

## 2011-04-24 DIAGNOSIS — I2510 Atherosclerotic heart disease of native coronary artery without angina pectoris: Secondary | ICD-10-CM

## 2011-04-24 DIAGNOSIS — E782 Mixed hyperlipidemia: Secondary | ICD-10-CM

## 2011-04-24 MED ORDER — OMEGA-3 FATTY ACIDS 300 MG PO CAPS: 1.0000 | ORAL_CAPSULE | Freq: Two times a day (BID) | ORAL | Status: DC

## 2011-04-24 MED ORDER — CARVEDILOL 12.5 MG PO TABS: 18.7500 mg | ORAL_TABLET | Freq: Two times a day (BID) | ORAL | Status: DC

## 2011-04-24 NOTE — Patient Instructions (Signed)
Your physician wants you to follow-up in: 3 months. You will receive a reminder letter in the mail one-two months in advance. If you don't receive a letter, please call our office to schedule the follow-up appointment. Return for nurse visit in 2 weeks as scheduled. Increase Fish Oil to 1 capsule two times a day. Increase Coreg (carvedilol) to 18.75 mg two times a day. This will be 1 1/2 of your 12.5 mg tablets. A new prescription was sent to your pharmacy to reflect this change.  Decrease saturated fats in your diet. (See Attached.) Call the office when you have finished your current supply of Lipitor (atrovastatin) so that the PA can change this to a different cholesterol medication.

## 2011-04-24 NOTE — Progress Notes (Signed)
HPI: Patient presents for post hospital followup, after recent hospitalization here at [facility], with accelerated hypertension and atypical chest pain. Serial cardiac markers were normal (peak troponin 0.08), in the setting of systolic BPs greater than 200. A 2-D echo was obtained, indicating normal LVF (EF 60-65%), with moderate LVH and diastolic dysfunction; no significant valvular abnormalities or PHTN. Her symptoms were felt to be atypical and we arranged for outpatient stress Myoview. This was essentially a low risk study, with suggestion of possible inferolateral scar with mild peri infarct ischemia; EF 61%. We also ordered outpatient carotid Dopplers, which yielded no significant bilateral ICA stenosis. A fasting lipid profile was notable for LDL 50, on maximum dose atorvastatin.  Patient also noted to have elevated QT interval (531 ms); a followup EKG in office today reveals improvement, at 462.  Clinically, patient reports no further chest pain. She reports BP readings at home as high as 169 systolic. She did not take her medicines this morning. She is also in the process of finding a new primary care physician.     No Known Allergies Current Outpatient Prescriptions  Medication Sig Dispense Refill  . amLODipine (NORVASC) 10 MG tablet Take 0.5 tablets (5 mg total) by mouth daily.  30 tablet  6  . aspirin 81 MG tablet Take 81 mg by mouth daily.        Marland Kitchen atorvastatin (LIPITOR) 80 MG tablet Take 1 tablet (80 mg total) by mouth daily.  30 tablet  6  . carvedilol (COREG) 12.5 MG tablet Take 12.5 mg by mouth 2 (two) times daily with a meal.       . clopidogrel (PLAVIX) 75 MG tablet Take 1 tablet (75 mg total) by mouth daily.  30 tablet  6  . DIOVAN HCT 160-25 MG per tablet TAKE ONE TABLET BY MOUTH DAILY  30 each  3  . levothyroxine (SYNTHROID, LEVOTHROID) 125 MCG tablet Take 125 mcg by mouth daily.        . nitroGLYCERIN (NITROSTAT) 0.4 MG SL tablet Place 0.4 mg under the tongue every 5 (five)  minutes as needed.        . Omega-3 Fatty Acids 300 MG CAPS Take 1 capsule by mouth daily.        . tamoxifen (NOLVADEX) 20 MG tablet Take 20 mg by mouth daily.        Marland Kitchen DISCONTD: valsartan-hydrochlorothiazide (DIOVAN-HCT) 320-25 MG per tablet Take 1 tablet by mouth at bedtime. [facility] and staff said patient recently picked up diovan/hct 160/25mg .        Past Medical History  Diagnosis Date  . NSTEMI (non-ST elevated myocardial infarction)     2/12  . Cardiomyopathy     Probable Takotsubo, LVEF 30-35% 2/12  . HTN (hypertension)   . Hypothyroidism   . Hyperlipidemia   . Diabetes mellitus   . Stroke     (2000) residual left-sided weakness  . Carotid artery disease   . Breast cancer   . CAD (coronary artery disease)     DES second diagonal 2/12    Past Surgical History  Procedure Date  . Carotid endarterectomy     Left  . Mastectomy     Bilateral  . Abdominal hysterectomy   . Cholecystectomy     History   Social History  . Marital Status: Married    Spouse Name: N/A    Number of Children: N/A  . Years of Education: N/A   Occupational History  . Not on file.  Social History Main Topics  . Smoking status: Never Smoker   . Smokeless tobacco: Never Used  . Alcohol Use: No  . Drug Use: No  . Sexually Active: Not on file   Other Topics Concern  . Not on file   Social History Narrative  . No narrative on file    Family History  Problem Relation Age of Onset  . Heart attack Sister   . Heart attack Brother     ROS: no nausea, vomiting; no fever, chills; no melena, hematochezia; no claudication  PHYSICAL EXAM: BP 188/80  Pulse 64  Ht 5\' 8"  (1.727 m)  Wt 226 lb (102.513 kg)  BMI 34.36 kg/m2 GENERAL: 71 year old female,  morbidly obese, sitting upright; NAD HEENT: NCAT, PERRLA, EOMI; sclera clear; no xanthelasma NECK: palpable bilateral carotid pulses, no bruits; no JVD; no TM LUNGS: CTA bilaterally CARDIAC: RRR (S1, S2); no significant  murmurs; no rubs or gallops ABDOMEN:  protuberant  EXTREMETIES: intact distal pulses; no significant peripheral edema SKIN: warm/dry; no obvious rash/lesions MUSCULOSKELETAL: no joint deformity NEURO: no focal deficit; NL affect   EKG: reviewed and available in Electronic Records   ASSESSMENT & PLAN:

## 2011-04-24 NOTE — Assessment & Plan Note (Signed)
Recent outpatient study indicated no significant bilateral ICA stenosis.

## 2011-04-24 NOTE — Assessment & Plan Note (Signed)
No further workup indicated. Recent study was low risk, and patient reports no further CP. Also, she presented to the hospital in the setting of accelerated HTN, with associated markedly elevated troponins. Continued secondary prevention is recommended.

## 2011-04-24 NOTE — Assessment & Plan Note (Signed)
This remains poorly controlled, based on most recent FLP yielding LDL 150, on full dose atorvastatin. Will increase omega-3 fish oil 2 twice a day, and reassess him 12 weeks, at time of next OV. We'll also plan on initiating treatment with Crestor, for more aggressive management, once she completes her current supply of atorvastatin.

## 2011-04-24 NOTE — Assessment & Plan Note (Signed)
Recent echo yielded continued normal LVF, with history of probable Takotsubo CM. Will increase Coreg to 18.75 2 times a day, with goal of 25 twice a day. This will also help with her uncontrolled HTN. We'll plan an RN visit in 2 weeks for BP/HR check.

## 2011-05-08 ENCOUNTER — Ambulatory Visit (INDEPENDENT_AMBULATORY_CARE_PROVIDER_SITE_OTHER): Payer: PRIVATE HEALTH INSURANCE | Admitting: *Deleted

## 2011-05-08 ENCOUNTER — Encounter: Payer: Self-pay | Admitting: *Deleted

## 2011-05-08 VITALS — BP 119/63 | HR 60 | Ht 68.0 in | Wt 225.0 lb

## 2011-05-08 DIAGNOSIS — I10 Essential (primary) hypertension: Secondary | ICD-10-CM

## 2011-05-08 NOTE — Progress Notes (Signed)
Patient presents to office today for BP and HR check due to having carvedilol dose increased during last office visit. Patient denies dizziness, shortness of breath or chest pain. Patient denies having any side effects from carvedilol since dose increase. Patient hasn't missed any doses.

## 2011-05-08 NOTE — Progress Notes (Signed)
reviewed

## 2011-05-13 ENCOUNTER — Ambulatory Visit (INDEPENDENT_AMBULATORY_CARE_PROVIDER_SITE_OTHER): Payer: PRIVATE HEALTH INSURANCE | Admitting: Family Medicine

## 2011-05-13 ENCOUNTER — Encounter: Payer: Self-pay | Admitting: Family Medicine

## 2011-05-13 VITALS — BP 186/86 | HR 87 | Resp 18 | Ht 66.0 in | Wt 227.1 lb

## 2011-05-13 DIAGNOSIS — I10 Essential (primary) hypertension: Secondary | ICD-10-CM

## 2011-05-13 DIAGNOSIS — R739 Hyperglycemia, unspecified: Secondary | ICD-10-CM | POA: Insufficient documentation

## 2011-05-13 DIAGNOSIS — R519 Headache, unspecified: Secondary | ICD-10-CM | POA: Insufficient documentation

## 2011-05-13 DIAGNOSIS — L609 Nail disorder, unspecified: Secondary | ICD-10-CM

## 2011-05-13 DIAGNOSIS — I429 Cardiomyopathy, unspecified: Secondary | ICD-10-CM

## 2011-05-13 DIAGNOSIS — R7309 Other abnormal glucose: Secondary | ICD-10-CM

## 2011-05-13 DIAGNOSIS — R42 Dizziness and giddiness: Secondary | ICD-10-CM

## 2011-05-13 DIAGNOSIS — R209 Unspecified disturbances of skin sensation: Secondary | ICD-10-CM

## 2011-05-13 DIAGNOSIS — E782 Mixed hyperlipidemia: Secondary | ICD-10-CM

## 2011-05-13 DIAGNOSIS — R202 Paresthesia of skin: Secondary | ICD-10-CM

## 2011-05-13 DIAGNOSIS — R51 Headache: Secondary | ICD-10-CM

## 2011-05-13 MED ORDER — MECLIZINE HCL 12.5 MG PO TABS: 12.5000 mg | ORAL_TABLET | Freq: Three times a day (TID) | ORAL | Status: DC | PRN

## 2011-05-13 NOTE — Patient Instructions (Signed)
Try the dizzy pill Continue your other medications You can take tylenol or aleve as needed I will get the records from your previous doctors F/U in 4 weeks for the headaches and dizzy spells

## 2011-05-13 NOTE — Assessment & Plan Note (Signed)
Per above, new onset, ? If related to increase in Beta blocker. Relieved with OTC meds

## 2011-05-13 NOTE — Assessment & Plan Note (Signed)
Noted in lower ext, hemiparesis from stroke on left side. At this time, no work-up symptoms not debilitating, ? If pt has glucose intolerance or DM

## 2011-05-13 NOTE — Assessment & Plan Note (Signed)
Podiatry for nail trimming 

## 2011-05-13 NOTE — Assessment & Plan Note (Signed)
Her dizzy spells are mostly when she arises quickly from a seated position. At times she does have true vertigo symptoms. She will like a trial of meclizine. Her exam was not concerning today. If she continues to have the headache which is just started as well as a dizzy spells I will obtain an MRI in a few weeks

## 2011-05-13 NOTE — Assessment & Plan Note (Signed)
Reviewed cardiology note

## 2011-05-13 NOTE — Assessment & Plan Note (Signed)
Lipitor currently

## 2011-05-13 NOTE — Assessment & Plan Note (Signed)
When reviewing labs it was noted that she had an A1c of 6.5% in the past. She states her primary doctor has been checking her blood work therefore I will obtain these records. I believe she will need to be checked again for diabetes.

## 2011-05-13 NOTE — Progress Notes (Signed)
  Subjective:    Patient ID: Robin [name], female    DOB: 04-Aug-1940, 71 y.o.   MRN: [id number]  HPI   Patient here to establish care. Previous PCP wasn't able [place]. She is followed by cardiology Dr. Diona Browner.   Hypertension-she is followed by cardiology. She is very labile blood pressures with a will be in the normal range and then will elevate very quickly. She was recently admitted to [facility] secondary to this. She also has a history of cardiomyopathy and remote history of CVA which has left her with left-sided weakness.   Headache- for the past week she has had a headache on and off. She's also had dizzy spells which occur mostly when she gets up quickly. She's been using Advil which helps the headache. She had a CT of her head one year ago when she was having head pains which was normal. Occasionally she gets headaches when watching TV.  Pains and needles and lower extremities on both sides. Her feet do swell. She does not have any numbness or tingling on the soles of her feet.Denies muscle spasm  Breast cancer- history of breast cancer she is status post bilateral mastectomy. She is followed by the cancer Center in [place]. She states she's been on tamoxifen for approximately 10 years however cannot verify this. Eye doctor- Dr. Nicky Pugh 6 children  Review of Systems  GEN- denies fatigue, fever, weight loss,weakness, recent illness HEENT- denies eye drainage, change in vision, nasal discharge, CVS- denies chest pain, palpitations RESP- denies SOB, cough, wheeze ABD- denies N/V, change in stools, abd pain GU- denies dysuria, hematuria, dribbling, incontinence MSK- +joint pain, muscle aches, injury Neuro- + headache, +dizziness, denies syncope, seizure activity, tingling in bilateral legs       Objective:   Physical Exam GEN- NAD, alert and oriented x3, obese, very pleasant HEENT- PERRL, EOMI, non injected sclera, pink conjunctiva, MMM, oropharynx clear,  fundoscopic exam difficult but disc appears normal Neck- Supple, no bruit, normal ROM CVS- RRR, 2/6  SEM RESP-CTAB EXT- +trace pedal edema, LLE cool to touch Neuro- left hemiparesis, gait abnormal- limps from left leg, hold left UE across abdomen, CNII-XII in tact, no nystagmus  Pulses- Radial, DP- 2+ Psych-memory in tact , normal affect  Feet- no open lesions very thick nails, +mild fungus      Assessment & Plan:

## 2011-05-13 NOTE — Assessment & Plan Note (Signed)
Blood pressure quite elevated today however she was just seen a cardiologist office 5 days ago on the same regimen and her blood pressure was in the normal range. She appears to have very fluctuating blood pressures per her report as well as her daughters. I would not change her medications today. There was some discrepancy in the chart regarding her being on 5 mg of amlodipine. This appeared to have been discontinued a few months ago when she had leg swelling. She does not have that with her and is not taking it.

## 2011-05-19 ENCOUNTER — Telehealth: Payer: Self-pay | Admitting: Family Medicine

## 2011-05-19 MED ORDER — LEVOTHYROXINE SODIUM 125 MCG PO TABS: 125.0000 ug | ORAL_TABLET | Freq: Every day | ORAL | Status: DC

## 2011-05-19 NOTE — Telephone Encounter (Signed)
Med refilled.

## 2011-05-21 ENCOUNTER — Telehealth: Payer: Self-pay | Admitting: Family Medicine

## 2011-05-21 ENCOUNTER — Other Ambulatory Visit: Payer: Self-pay | Admitting: *Deleted

## 2011-05-21 DIAGNOSIS — Z79899 Other long term (current) drug therapy: Secondary | ICD-10-CM

## 2011-05-21 DIAGNOSIS — E785 Hyperlipidemia, unspecified: Secondary | ICD-10-CM

## 2011-05-21 MED ORDER — SYNTHROID 125 MCG PO TABS: 125.0000 ug | ORAL_TABLET | Freq: Every day | ORAL | Status: DC

## 2011-05-21 MED ORDER — ROSUVASTATIN CALCIUM 40 MG PO TABS: 40.0000 mg | ORAL_TABLET | Freq: Every day | ORAL | Status: DC

## 2011-05-21 NOTE — Telephone Encounter (Signed)
New rx sent to [facility]/daughter informed patient need fasting labs in 12 weeks at the [facility].

## 2011-05-21 NOTE — Telephone Encounter (Signed)
Start Crestor 40 mg daily, and check FLP/LFTs in 12 weeks.

## 2011-05-21 NOTE — Telephone Encounter (Signed)
Brand name sent

## 2011-05-21 NOTE — Telephone Encounter (Signed)
Daughter called to say that patient has completed the lipitor and is needing the new cholesterol medicine called to [facility] that PA informed them will be done once atorvastatin depleted. Please advise of dose of Crestor.

## 2011-05-29 ENCOUNTER — Other Ambulatory Visit: Payer: Self-pay | Admitting: *Deleted

## 2011-05-29 MED ORDER — CLOPIDOGREL BISULFATE 75 MG PO TABS: 75.0000 mg | ORAL_TABLET | Freq: Every day | ORAL | Status: DC

## 2011-06-10 ENCOUNTER — Ambulatory Visit: Payer: PRIVATE HEALTH INSURANCE | Admitting: Family Medicine

## 2011-06-24 ENCOUNTER — Ambulatory Visit (INDEPENDENT_AMBULATORY_CARE_PROVIDER_SITE_OTHER): Payer: PRIVATE HEALTH INSURANCE | Admitting: Family Medicine

## 2011-06-24 ENCOUNTER — Encounter: Payer: Self-pay | Admitting: Family Medicine

## 2011-06-24 VITALS — BP 148/82 | HR 79 | Resp 18 | Ht 66.0 in | Wt 227.1 lb

## 2011-06-24 DIAGNOSIS — I10 Essential (primary) hypertension: Secondary | ICD-10-CM

## 2011-06-24 DIAGNOSIS — R739 Hyperglycemia, unspecified: Secondary | ICD-10-CM

## 2011-06-24 DIAGNOSIS — R7309 Other abnormal glucose: Secondary | ICD-10-CM

## 2011-06-24 DIAGNOSIS — R42 Dizziness and giddiness: Secondary | ICD-10-CM

## 2011-06-24 DIAGNOSIS — K5289 Other specified noninfective gastroenteritis and colitis: Secondary | ICD-10-CM

## 2011-06-24 DIAGNOSIS — E039 Hypothyroidism, unspecified: Secondary | ICD-10-CM

## 2011-06-24 DIAGNOSIS — K529 Noninfective gastroenteritis and colitis, unspecified: Secondary | ICD-10-CM

## 2011-06-24 LAB — CBC WITH DIFFERENTIAL/PLATELET
Basophils Absolute: 0 10*3/uL (ref 0.0–0.1)
Basophils Relative: 0 % (ref 0–1)
Eosinophils Relative: 5 % (ref 0–5)
HCT: 32.2 % — ABNORMAL LOW (ref 36.0–46.0)
MCH: 25.6 pg — ABNORMAL LOW (ref 26.0–34.0)
MCHC: 33.2 g/dL (ref 30.0–36.0)
MCV: 77 fL — ABNORMAL LOW (ref 78.0–100.0)
Monocytes Absolute: 0.6 10*3/uL (ref 0.1–1.0)
RDW: 15.4 % (ref 11.5–15.5)

## 2011-06-24 LAB — COMPREHENSIVE METABOLIC PANEL
ALT: 12 U/L (ref 0–35)
AST: 21 U/L (ref 0–37)
Alkaline Phosphatase: 59 U/L (ref 39–117)
Chloride: 105 mEq/L (ref 96–112)
Creat: 1.07 mg/dL (ref 0.50–1.10)
Total Bilirubin: 0.4 mg/dL (ref 0.3–1.2)

## 2011-06-24 LAB — TSH: TSH: 4.706 u[IU]/mL — ABNORMAL HIGH (ref 0.350–4.500)

## 2011-06-24 MED ORDER — TAMOXIFEN CITRATE 20 MG PO TABS: 20.0000 mg | ORAL_TABLET | Freq: Every day | ORAL | Status: DC

## 2011-06-24 MED ORDER — VALSARTAN-HYDROCHLOROTHIAZIDE 160-25 MG PO TABS: 1.0000 | ORAL_TABLET | Freq: Every day | ORAL | Status: DC

## 2011-06-24 MED ORDER — SYNTHROID 125 MCG PO TABS: 125.0000 ug | ORAL_TABLET | Freq: Every day | ORAL | Status: DC

## 2011-06-24 NOTE — Assessment & Plan Note (Signed)
Check A1C 

## 2011-06-24 NOTE — Assessment & Plan Note (Signed)
Controlled with meclizine, no further Headache

## 2011-06-24 NOTE — Assessment & Plan Note (Signed)
Blood pressure improved, no change to meds

## 2011-06-24 NOTE — Patient Instructions (Signed)
Blood pressure much improved today Get the labs done, I will check for diabetes Continue all other medications F/U 3 months

## 2011-06-24 NOTE — Assessment & Plan Note (Signed)
Normal exam, encouraged small meals, staying hydrated, no further diarrhea, labs to be checked per above, given red flags

## 2011-06-24 NOTE — Progress Notes (Signed)
  Subjective:    Patient ID: Robin [name], female    DOB: 09/19/1940, 71 y.o.   MRN: [id number]  HPI Pt here for interim follow-up for blood pressure. She has been doing well with exception of stomach virus that started last Friday and lasted until Sunday evening, she occasionally gets stomach upset, diarrhea now resolved, no emesis, appetite is still poor but drinking fluids.  No recent headaches and meclizine worked very well for her dizzy spells Seen by podiatry-  S/p Colonoscopy- Dr. Allena Katz in [place], had 1 polpy removed Review of Systems    GEN- denies fatigue, fever, weight loss,weakness, recent illness HEENT- denies eye drainage, change in vision, nasal discharge, CVS- denies chest pain, palpitations RESP- denies SOB, cough, wheeze ABD- denies N/V, change in stools, abd pain GU- denies dysuria, hematuria, dribbling, incontinence MSK- +joint pain, muscle aches, injury Neuro- denies headache, dizziness, denies syncope, seizure activity, tingling in bilateral legs     Objective:   Physical Exam GEN- NAD, alert and oriented x3, obese, very pleasant, no weight change HEENT- PERRL, EOMI, non injected sclera, pink conjunctiva, MMM, oropharynx clear, Neck- Supple, no LAD CVS- RRR, 2/6  SEM RESP-CTAB ABD- NABS,soft, NT,ND  EXT- No pedal edema,  Neuro- left hemiparesis, Pulses- Radial, DP- 2+         Assessment & Plan:

## 2011-06-25 LAB — HEMOGLOBIN A1C: Mean Plasma Glucose: 131 mg/dL — ABNORMAL HIGH (ref <117)

## 2011-06-29 ENCOUNTER — Encounter: Payer: Self-pay | Admitting: Family Medicine

## 2011-06-30 NOTE — Progress Notes (Signed)
Addended by: Milinda Antis F on: 06/30/2011 02:26 PM   Modules accepted: Orders

## 2011-08-02 ENCOUNTER — Other Ambulatory Visit: Payer: Self-pay | Admitting: Family Medicine

## 2011-08-02 LAB — T3, FREE: T3, Free: 2.8 pg/mL (ref 2.3–4.2)

## 2011-08-14 ENCOUNTER — Other Ambulatory Visit: Payer: Self-pay | Admitting: *Deleted

## 2011-08-14 ENCOUNTER — Telehealth: Payer: Self-pay | Admitting: *Deleted

## 2011-08-14 DIAGNOSIS — Z79899 Other long term (current) drug therapy: Secondary | ICD-10-CM

## 2011-08-14 DIAGNOSIS — E785 Hyperlipidemia, unspecified: Secondary | ICD-10-CM

## 2011-08-14 NOTE — Telephone Encounter (Signed)
Spoke with patient and informed her that she was due for f/u with MD and fasting labs. Patient verbalized understanding. Lab orders sent to [facility] lab.

## 2011-09-08 ENCOUNTER — Telehealth: Payer: Self-pay | Admitting: Family Medicine

## 2011-09-08 MED ORDER — MECLIZINE HCL 12.5 MG PO TABS: 12.5000 mg | ORAL_TABLET | Freq: Three times a day (TID) | ORAL | Status: AC | PRN

## 2011-09-08 NOTE — Telephone Encounter (Signed)
refilled 

## 2011-09-09 ENCOUNTER — Ambulatory Visit: Payer: PRIVATE HEALTH INSURANCE | Admitting: Family Medicine

## 2011-09-09 ENCOUNTER — Telehealth: Payer: Self-pay | Admitting: *Deleted

## 2011-09-09 NOTE — Telephone Encounter (Signed)
Message left on nurse's voicemail r/e patient having possible side effects from crestor.  Nurse called and spoke with daughter r/e this and she said patient went to [facility] today for hip and leg pains, and the doctor told her it may be coming from the crestor. Nurse advised daughter to have patient take 1/2 (20 mg) daily to see if her symptoms improve, then call our office back with status. Patient has appointment with Diona Browner on 09/26/11 and will be repeating her fasting lab work b/4 that time.

## 2011-09-18 ENCOUNTER — Encounter: Payer: Self-pay | Admitting: Family Medicine

## 2011-09-18 ENCOUNTER — Ambulatory Visit (INDEPENDENT_AMBULATORY_CARE_PROVIDER_SITE_OTHER): Payer: PRIVATE HEALTH INSURANCE | Admitting: Family Medicine

## 2011-09-18 VITALS — BP 144/82 | HR 74 | Resp 18 | Ht 66.0 in | Wt 226.1 lb

## 2011-09-18 DIAGNOSIS — M169 Osteoarthritis of hip, unspecified: Secondary | ICD-10-CM

## 2011-09-18 DIAGNOSIS — M791 Myalgia, unspecified site: Secondary | ICD-10-CM

## 2011-09-18 DIAGNOSIS — E039 Hypothyroidism, unspecified: Secondary | ICD-10-CM

## 2011-09-18 DIAGNOSIS — E782 Mixed hyperlipidemia: Secondary | ICD-10-CM

## 2011-09-18 DIAGNOSIS — I10 Essential (primary) hypertension: Secondary | ICD-10-CM

## 2011-09-18 DIAGNOSIS — E669 Obesity, unspecified: Secondary | ICD-10-CM

## 2011-09-18 DIAGNOSIS — M79609 Pain in unspecified limb: Secondary | ICD-10-CM

## 2011-09-18 DIAGNOSIS — M79606 Pain in leg, unspecified: Secondary | ICD-10-CM

## 2011-09-18 DIAGNOSIS — IMO0001 Reserved for inherently not codable concepts without codable children: Secondary | ICD-10-CM

## 2011-09-18 DIAGNOSIS — M1610 Unilateral primary osteoarthritis, unspecified hip: Secondary | ICD-10-CM

## 2011-09-18 MED ORDER — HYDROCODONE-ACETAMINOPHEN 5-325 MG PO TABS: 1.0000 | ORAL_TABLET | Freq: Two times a day (BID) | ORAL | Status: AC | PRN

## 2011-09-18 NOTE — Patient Instructions (Signed)
For your leg pain, get the labs done Stop the crestor until your see the heart doctor next week  Use the pain pill, especially at night to help you sleep I will call about the [facility] orthopedic doctor  F/U 3 months - please cancer appt for Sept

## 2011-09-18 NOTE — Progress Notes (Signed)
  Subjective:    Patient ID: Robin [name], female    DOB: Jul 22, 1940, 71 y.o.   MRN: [id number]  HPI Patient presents with right thigh pain for the past week. She was seen at [facility] urgent care in [facility] x-rays were obtained which he states were normal of her hip and back. She was given a shot and tramadol which has not helped. She was also told to cut her Crestor and half however has not seen much change but her leg pain with this. She has a soreness all over her thigh there is no pain in the calf on the right side and there is no pain at all on the left side. She denies any injury or fall. She denies back pain. She denies change in bowel or bladder   Review of Systems  GEN- denies fatigue, fever, weight loss,weakness, recent illness HEENT- denies eye drainage, change in vision, nasal discharge, CVS- denies chest pain, palpitations RESP- denies SOB, cough, wheeze ABD- denies N/V, change in stools, abd pain GU- denies dysuria, hematuria, dribbling, incontinence MSK- denies joint pain,+ muscle aches, injury Neuro- denies headache, dizziness, syncope, seizure activity      Objective:   Physical Exam GEN- NAD, alert and oriented x3, obese, very pleasant, no weight change CVS- RRR, 2/6  SEM RESP-CTAB ABD- NABS,soft, NT,ND  EXT- No pedal edema, Thigh TTP on lateral aspect and medial thigh, no mass felt  Neuro- left hemiparesis, Pulses- Radial, DP- 2+ Back- spine NT  Hip- Right side, pain with ER, no pain with IR,  Knee- fair ROM, no effusion       Assessment & Plan:

## 2011-09-19 DIAGNOSIS — E669 Obesity, unspecified: Secondary | ICD-10-CM | POA: Insufficient documentation

## 2011-09-19 DIAGNOSIS — M169 Osteoarthritis of hip, unspecified: Secondary | ICD-10-CM | POA: Insufficient documentation

## 2011-09-19 DIAGNOSIS — M79606 Pain in leg, unspecified: Secondary | ICD-10-CM | POA: Insufficient documentation

## 2011-09-19 DIAGNOSIS — E039 Hypothyroidism, unspecified: Secondary | ICD-10-CM | POA: Insufficient documentation

## 2011-09-19 NOTE — Assessment & Plan Note (Signed)
Her pain is actually consistent with a myalgia secondary to statin therapy. Pain is mostly in her right leg. I think that this is probably radiation from possible hip osteoarthritis. I will have her see orthopedics in [place]. Her x-rays obtained from the urgent care in [place] showed moderate osteoarthritis. As she is are he contacted her cardiologist I will have her stop the statin drugs for one week until she is seen by them next week. I believe this will likely be restarted

## 2011-09-19 NOTE — Assessment & Plan Note (Signed)
Blood pressure is at baseline. No change in medications

## 2011-09-19 NOTE — Assessment & Plan Note (Signed)
Refer to ortho, given norco which she can take 1/2 tablet to 1  as needed for pain.

## 2011-09-19 NOTE — Assessment & Plan Note (Signed)
She's currently on Crestor however this will be stopped for one week. Lipid panel has been ordered by her cardiologist

## 2011-09-19 NOTE — Assessment & Plan Note (Signed)
Repete thyroid studies were within normal limits. Continue current dose

## 2011-09-20 LAB — BASIC METABOLIC PANEL
BUN: 23 mg/dL (ref 6–23)
CO2: 27 mEq/L (ref 19–32)
Chloride: 106 mEq/L (ref 96–112)
Creat: 1.13 mg/dL — ABNORMAL HIGH (ref 0.50–1.10)
Glucose, Bld: 115 mg/dL — ABNORMAL HIGH (ref 70–99)

## 2011-09-20 LAB — CK: Total CK: 206 U/L — ABNORMAL HIGH (ref 7–177)

## 2011-09-24 ENCOUNTER — Telehealth: Payer: Self-pay | Admitting: *Deleted

## 2011-09-24 NOTE — Telephone Encounter (Signed)
Patient informed. 

## 2011-09-24 NOTE — Telephone Encounter (Signed)
Message copied by Eustace Moore on Wed Sep 24, 2011 11:07 AM ------      Message from: Rande Brunt      Created: Thu Sep 18, 2011 12:19 PM       LDL 84, NL LFTs. Reassess clinical response to Crestor, at recently reduced dose, at OV next week.

## 2011-09-25 ENCOUNTER — Telehealth: Payer: Self-pay | Admitting: Family Medicine

## 2011-09-25 NOTE — Telephone Encounter (Signed)
Patient of Dr. Jeanice Lim.  Her labs were done on 8-29.

## 2011-09-25 NOTE — Telephone Encounter (Signed)
Muscle enzyme slightly elevated, though improved from last year, if still having muscle aches , may want to hold cholesterol medicine and discuss further with Dr Jeanice Lim when she returns next week, otherwise no major concerns

## 2011-09-26 ENCOUNTER — Encounter: Payer: Self-pay | Admitting: Cardiology

## 2011-09-26 ENCOUNTER — Ambulatory Visit (INDEPENDENT_AMBULATORY_CARE_PROVIDER_SITE_OTHER): Payer: PRIVATE HEALTH INSURANCE | Admitting: Cardiology

## 2011-09-26 VITALS — BP 180/88 | HR 73 | Ht 66.0 in | Wt 222.0 lb

## 2011-09-26 DIAGNOSIS — I429 Cardiomyopathy, unspecified: Secondary | ICD-10-CM

## 2011-09-26 DIAGNOSIS — I10 Essential (primary) hypertension: Secondary | ICD-10-CM

## 2011-09-26 DIAGNOSIS — E782 Mixed hyperlipidemia: Secondary | ICD-10-CM

## 2011-09-26 NOTE — Progress Notes (Signed)
Clinical Summary Ms. Kassin is a 71 y.o.female presenting for followup. She was last seen in April. She is here with her daughter. She has had no hospitalizations since last visit. Complains of no chest pain or progressive shortness of breath. She states that her blood pressure is typically well controlled at home, citing systolic blood pressures in the 100-120 range.  She does complain of right hip and upper leg pain, states that this occurred after she got up one morning. Does not remember any specific injury. She has been treated with pain medicines by Dr. Jeanice Lim. Uncertain she has had any any imaging as yet.  Recent lipids from August showed normal liver function tests, triglycerides 191, cholesterol 166, HDL 44, LDL 84.   No Known Allergies  Current Outpatient Prescriptions  Medication Sig Dispense Refill  . aspirin 81 MG tablet Take 81 mg by mouth daily.        . carvedilol (COREG) 12.5 MG tablet Take 1.5 tablets (18.75 mg total) by mouth 2 (two) times daily with a meal.  90 tablet  6  . clopidogrel (PLAVIX) 75 MG tablet Take 1 tablet (75 mg total) by mouth daily.  30 tablet  6  . HYDROcodone-acetaminophen (NORCO/VICODIN) 5-325 MG per tablet Take 1 tablet by mouth 2 (two) times daily as needed for pain.  30 tablet  1  . meclizine (ANTIVERT) 12.5 MG tablet Take 1 tablet (12.5 mg total) by mouth 3 (three) times daily as needed for dizziness or nausea.  30 tablet  3  . nitroGLYCERIN (NITROSTAT) 0.4 MG SL tablet Place 0.4 mg under the tongue every 5 (five) minutes as needed.        . Omega-3 Fatty Acids 300 MG CAPS Take 1 capsule (300 mg total) by mouth 2 (two) times daily.      . rosuvastatin (CRESTOR) 40 MG tablet Take 1 tablet (40 mg total) by mouth daily.  30 tablet  6  . SYNTHROID 125 MCG tablet Take 1 tablet (125 mcg total) by mouth daily.  30 tablet  3  . tamoxifen (NOLVADEX) 20 MG tablet Take 1 tablet (20 mg total) by mouth daily.  30 tablet  3  . valsartan-hydrochlorothiazide  (DIOVAN HCT) 160-25 MG per tablet Take 1 tablet by mouth daily.  30 tablet  3    Past Medical History  Diagnosis Date  . NSTEMI (non-ST elevated myocardial infarction)     2/12  . Cardiomyopathy     Probable Takotsubo, LVEF 30-35% 2/12  . HTN (hypertension)   . Hypothyroidism   . Hyperlipidemia   . Diabetes mellitus   . Stroke     (2000) residual left-sided weakness  . Carotid artery disease   . CAD (coronary artery disease)     DES second diagonal 2/12  . Breast cancer     Tamoxifen started 2010  . Hepatic steatosis   . Pancreas divisum     Social History Ms. Vandergriff reports that she has never smoked. She has never used smokeless tobacco. Ms. Wetherby reports that she does not drink alcohol.  Review of Systems No palpitations, dizziness, syncope. No reported bleeding episodes. It was negative.  Physical Examination Filed Vitals:   09/26/11 1405  BP: 180/88  Pulse: 73   Morbidly obese woman in no acute distress.  HEENT: Conjunctiva and lids normal, oropharynx clear with moist mucosa.  Neck: Supple, no elevated JVP or carotid bruits, no thyromegaly.  Lungs: Clear to auscultation, nonlabored breathing at rest.  Cardiac: Regular rate  and rhythm, no S3, 2/6 systolic murmur, no pericardial rub.  Abdomen: Soft, nontender, no hepatomegaly, bowel sounds present, no guarding or rebound.  Extremities: 1+ ankle edema bilaterally.  Skin: Warm and dry.  Musculoskeletal: No kyphosis.  Neuropsychiatric: Alert and oriented x3, affect grossly appropriate.   Problem List and Plan   Secondary cardiomyopathy, unspecified LV function improved to normal range by echocardiogram earlier this year on medications. She reports no new symptoms.  Mixed hyperlipidemia Recent lipid numbers reviewed, good LDL control on Crestor.  Essential hypertension, benign Blood pressure elevated today, although reportedly much better at home check. I asked her to keep an eye on this. Medications may  need further titration.    Jonelle Sidle, M.D., F.A.C.C.

## 2011-09-26 NOTE — Assessment & Plan Note (Signed)
Recent lipid numbers reviewed, good LDL control on Crestor.

## 2011-09-26 NOTE — Assessment & Plan Note (Signed)
LV function improved to normal range by echocardiogram earlier this year on medications. She reports no new symptoms.

## 2011-09-26 NOTE — Telephone Encounter (Signed)
Called patient and left message for them to return call at the office   

## 2011-09-26 NOTE — Patient Instructions (Addendum)

## 2011-09-26 NOTE — Assessment & Plan Note (Signed)
Blood pressure elevated today, although reportedly much better at home check. I asked her to keep an eye on this. Medications may need further titration.

## 2011-09-29 NOTE — Telephone Encounter (Signed)
Please tell her the muscle enzymes were a little high but this is better from her previous blood checks.  If she is still having a lot of pain in that thigh, decrease her cholesterol medication to three times a week She has also been referred to the orthopedic doctor, for her hip arthritis that can cause the same pain

## 2011-10-01 ENCOUNTER — Ambulatory Visit: Payer: PRIVATE HEALTH INSURANCE | Admitting: Family Medicine

## 2011-10-02 NOTE — Telephone Encounter (Signed)
Called pt phone busy x 2  

## 2011-10-03 ENCOUNTER — Ambulatory Visit: Payer: PRIVATE HEALTH INSURANCE | Admitting: Family Medicine

## 2011-10-03 NOTE — Telephone Encounter (Signed)
Patients daughter aware

## 2011-10-03 NOTE — Telephone Encounter (Signed)
Called number provided again and left message to call back

## 2011-10-21 ENCOUNTER — Telehealth: Payer: Self-pay | Admitting: Family Medicine

## 2011-10-21 MED ORDER — SYNTHROID 125 MCG PO TABS: 125.0000 ug | ORAL_TABLET | Freq: Every day | ORAL | Status: DC

## 2011-10-21 NOTE — Telephone Encounter (Signed)
Sent in

## 2011-10-27 ENCOUNTER — Other Ambulatory Visit: Payer: Self-pay | Admitting: Family Medicine

## 2011-10-28 ENCOUNTER — Other Ambulatory Visit: Payer: Self-pay | Admitting: Family Medicine

## 2011-10-28 ENCOUNTER — Encounter: Payer: Self-pay | Admitting: Family Medicine

## 2011-10-28 DIAGNOSIS — M79606 Pain in leg, unspecified: Secondary | ICD-10-CM

## 2011-10-28 DIAGNOSIS — M169 Osteoarthritis of hip, unspecified: Secondary | ICD-10-CM

## 2011-12-04 ENCOUNTER — Encounter: Payer: Self-pay | Admitting: Family Medicine

## 2011-12-04 ENCOUNTER — Ambulatory Visit (INDEPENDENT_AMBULATORY_CARE_PROVIDER_SITE_OTHER): Payer: PRIVATE HEALTH INSURANCE | Admitting: Family Medicine

## 2011-12-04 VITALS — BP 148/64 | HR 70 | Resp 15 | Ht 66.0 in | Wt 229.0 lb

## 2011-12-04 DIAGNOSIS — E669 Obesity, unspecified: Secondary | ICD-10-CM

## 2011-12-04 DIAGNOSIS — R7309 Other abnormal glucose: Secondary | ICD-10-CM

## 2011-12-04 DIAGNOSIS — E785 Hyperlipidemia, unspecified: Secondary | ICD-10-CM

## 2011-12-04 DIAGNOSIS — H9319 Tinnitus, unspecified ear: Secondary | ICD-10-CM

## 2011-12-04 DIAGNOSIS — R7303 Prediabetes: Secondary | ICD-10-CM

## 2011-12-04 DIAGNOSIS — I10 Essential (primary) hypertension: Secondary | ICD-10-CM

## 2011-12-04 DIAGNOSIS — M169 Osteoarthritis of hip, unspecified: Secondary | ICD-10-CM

## 2011-12-04 DIAGNOSIS — M1610 Unilateral primary osteoarthritis, unspecified hip: Secondary | ICD-10-CM

## 2011-12-04 DIAGNOSIS — I2510 Atherosclerotic heart disease of native coronary artery without angina pectoris: Secondary | ICD-10-CM

## 2011-12-04 NOTE — Assessment & Plan Note (Signed)
Improved leg pain, no further meds or referral at this time

## 2011-12-04 NOTE — Assessment & Plan Note (Addendum)
Repeat BP improved with proper cuff, no change to meds

## 2011-12-04 NOTE — Assessment & Plan Note (Signed)
Unchanged, limited mobility

## 2011-12-04 NOTE — Patient Instructions (Addendum)
Call if the ringing in your ear does not improve  Or if you get dizzy Get the labs done before the next visit  Continue all medications  F/U 3 months

## 2011-12-04 NOTE — Assessment & Plan Note (Signed)
No associated dizziness, vertigo, change in hearing, infection. As self limited and not very bothersome we will see if this resolves on its own first, if not ENT referral

## 2011-12-04 NOTE — Progress Notes (Signed)
  Subjective:    Patient ID: Robin [name], female    DOB: [birth year], 71 y.o.   MRN: [id number]  HPI Patient here to follow chronic medical problems. She has had ringing in her ears on and off for the past few days. She denies any dizziness or drainage from her ear. She did not see orthopedic surgery as well he called back multiple time she states that her pain had improved. She is no longer using the pain medications are He has been taking her blood pressure at home with a wrist cuff and her blood pressure this morning was 163/69. She declines flu shot and has had pneumonia shot per report   Review of Systems - per above   GEN- denies fatigue, fever, weight loss,weakness, recent illness HEENT- denies eye drainage, change in vision, nasal discharge, CVS- denies chest pain, palpitations RESP- denies SOB, cough, wheeze ABD- denies N/V, change in stools, abd pain GU- denies dysuria, hematuria, dribbling, incontinence MSK- denies joint pain, muscle aches, injury Neuro- denies headache, dizziness, syncope, seizure activity      Objective:   Physical Exam GEN- NAD, alert and oriented x3, obese, very pleasant, no weight change HEENT-PERRL, EOMI, non icteric, oropharynx clear, nares clear, TM clear bilat, no wax, no effusion, hearing grossly in tact  CVS- RRR, 2/6  SEM RESP-CTAB ABD- NABS,soft, NT,ND  EXT- No pedal edema,  Neuro- left hemiparesis, Pulses- Radial, DP- 2+        Assessment & Plan:

## 2011-12-04 NOTE — Assessment & Plan Note (Signed)
Reviewed cardiology note, no changes, no CP

## 2012-01-05 ENCOUNTER — Other Ambulatory Visit: Payer: Self-pay | Admitting: Family Medicine

## 2012-02-01 ENCOUNTER — Other Ambulatory Visit: Payer: Self-pay | Admitting: Family Medicine

## 2012-02-02 ENCOUNTER — Other Ambulatory Visit: Payer: Self-pay | Admitting: Cardiology

## 2012-02-10 ENCOUNTER — Other Ambulatory Visit: Payer: Self-pay | Admitting: Family Medicine

## 2012-03-05 ENCOUNTER — Ambulatory Visit: Payer: PRIVATE HEALTH INSURANCE | Admitting: Family Medicine

## 2012-03-23 ENCOUNTER — Other Ambulatory Visit: Payer: Self-pay | Admitting: Family Medicine

## 2012-03-29 ENCOUNTER — Other Ambulatory Visit: Payer: Self-pay | Admitting: Family Medicine

## 2012-03-30 ENCOUNTER — Other Ambulatory Visit: Payer: Self-pay | Admitting: Physician Assistant

## 2012-04-19 ENCOUNTER — Encounter: Payer: Self-pay | Admitting: Family Medicine

## 2012-04-19 ENCOUNTER — Ambulatory Visit (INDEPENDENT_AMBULATORY_CARE_PROVIDER_SITE_OTHER): Payer: Medicare PPO | Admitting: Family Medicine

## 2012-04-19 VITALS — BP 148/82 | HR 82 | Resp 16 | Ht 66.0 in | Wt 232.1 lb

## 2012-04-19 DIAGNOSIS — I10 Essential (primary) hypertension: Secondary | ICD-10-CM

## 2012-04-19 DIAGNOSIS — R7309 Other abnormal glucose: Secondary | ICD-10-CM

## 2012-04-19 DIAGNOSIS — L259 Unspecified contact dermatitis, unspecified cause: Secondary | ICD-10-CM

## 2012-04-19 DIAGNOSIS — N390 Urinary tract infection, site not specified: Secondary | ICD-10-CM

## 2012-04-19 DIAGNOSIS — R7303 Prediabetes: Secondary | ICD-10-CM

## 2012-04-19 DIAGNOSIS — E039 Hypothyroidism, unspecified: Secondary | ICD-10-CM

## 2012-04-19 DIAGNOSIS — L309 Dermatitis, unspecified: Secondary | ICD-10-CM

## 2012-04-19 DIAGNOSIS — E785 Hyperlipidemia, unspecified: Secondary | ICD-10-CM

## 2012-04-19 DIAGNOSIS — M169 Osteoarthritis of hip, unspecified: Secondary | ICD-10-CM

## 2012-04-19 DIAGNOSIS — R739 Hyperglycemia, unspecified: Secondary | ICD-10-CM

## 2012-04-19 DIAGNOSIS — M1610 Unilateral primary osteoarthritis, unspecified hip: Secondary | ICD-10-CM

## 2012-04-19 LAB — POCT URINALYSIS DIPSTICK
Bilirubin, UA: NEGATIVE
Nitrite, UA: NEGATIVE
Spec Grav, UA: 1.025
pH, UA: 5.5

## 2012-04-19 MED ORDER — CLOTRIMAZOLE-BETAMETHASONE 1-0.05 % EX CREA: TOPICAL_CREAM | CUTANEOUS | Status: AC

## 2012-04-19 NOTE — Progress Notes (Signed)
  Subjective:    Patient ID: Robin [name], female    DOB: [birth year], 72 y.o.   MRN: [id number]  HPI Pt here to f/u chronic medical problems, noticed rash on her finger past few weeks, itches and has been getting larger in size Also complains of urinary frequency past 2 days with mild odor to urine, no abd pain, N/V, fever Plans to see pain management referred by her orthopedic surgeon for her OA of hip Due to see cardiology this summer Medications reviewed   Review of Systems   GEN- denies fatigue, fever, weight loss,weakness, recent illness HEENT- denies eye drainage, change in vision, nasal discharge, CVS- denies chest pain, palpitations RESP- denies SOB, cough, wheeze ABD- denies N/V, change in stools, abd pain GU- denies dysuria, hematuria, dribbling, incontinence, + frequency MSK- denies joint pain, muscle aches, injury Neuro- denies headache, dizziness, syncope, seizure activity      Objective:   Physical Exam GEN- NAD, alert and oriented x3 HEENT- PERRL, EOMI, non injected sclera, pink conjunctiva, MMM, oropharynx clear Neck- Supple,  CVS- RRR, no murmur RESP-CTAB EXT- trace pedal edema Pulses- Radial, DP- 2+ Skin- Right hand 2nd digit- mild erythema with pigementatiatin and dry skin        Assessment & Plan:

## 2012-04-19 NOTE — Patient Instructions (Addendum)
Apply cream a day until gone  Use rubber gloves to protect hands We will call if antibiotics needed for urine We will get fasting labs today  I will get the last note from [facility]  Make an appointment for eye doctor Make an appointment for cancer center Make an appointment with Dr. Diona Browner F/U 3 months

## 2012-04-21 ENCOUNTER — Encounter: Payer: Self-pay | Admitting: Family Medicine

## 2012-04-21 DIAGNOSIS — N390 Urinary tract infection, site not specified: Secondary | ICD-10-CM | POA: Insufficient documentation

## 2012-04-21 DIAGNOSIS — L309 Dermatitis, unspecified: Secondary | ICD-10-CM | POA: Insufficient documentation

## 2012-04-21 MED ORDER — CEPHALEXIN 500 MG PO CAPS: 500.0000 mg | ORAL_CAPSULE | Freq: Four times a day (QID) | ORAL | Status: AC

## 2012-04-21 NOTE — Assessment & Plan Note (Signed)
BP looks good today, her BP tends to fluctuate a lot, asymptomatic

## 2012-04-21 NOTE — Assessment & Plan Note (Signed)
She has been referred to pain clinic Dr. Laurian Brim, will follow medication changes, no surgical intervention due to health

## 2012-04-21 NOTE — Assessment & Plan Note (Signed)
See instructions, lotrisone cream

## 2012-04-21 NOTE — Assessment & Plan Note (Signed)
Treat with keflex , UC E coli

## 2012-04-21 NOTE — Assessment & Plan Note (Signed)
Glucose intolerance check A1C

## 2012-04-22 LAB — URINE CULTURE: Colony Count: >=100000

## 2012-05-03 ENCOUNTER — Other Ambulatory Visit: Payer: Self-pay | Admitting: Family Medicine

## 2012-05-03 LAB — COMPLETE METABOLIC PANEL WITH GFR
BUN: 28 mg/dL — ABNORMAL HIGH (ref 6–23)
CO2: 27 mEq/L (ref 19–32)
Calcium: 9.7 mg/dL (ref 8.4–10.5)
Chloride: 105 mEq/L (ref 96–112)
Creat: 1.16 mg/dL — ABNORMAL HIGH (ref 0.50–1.10)
GFR, Est African American: 54 mL/min — ABNORMAL LOW
GFR, Est Non African American: 46 mL/min — ABNORMAL LOW
Glucose, Bld: 114 mg/dL — ABNORMAL HIGH (ref 70–99)

## 2012-05-03 LAB — CBC
Platelets: 182 10*3/uL (ref 150–400)
RBC: 4.09 MIL/uL (ref 3.87–5.11)
RDW: 15.9 % — ABNORMAL HIGH (ref 11.5–15.5)
WBC: 7.7 10*3/uL (ref 4.0–10.5)

## 2012-05-03 LAB — LIPID PANEL: Cholesterol: 125 mg/dL (ref 0–200)

## 2012-05-03 LAB — HEMOGLOBIN A1C: Mean Plasma Glucose: 140 mg/dL — ABNORMAL HIGH (ref <117)

## 2012-05-03 LAB — TSH: TSH: 0.247 u[IU]/mL — ABNORMAL LOW (ref 0.350–4.500)

## 2012-05-26 ENCOUNTER — Ambulatory Visit (INDEPENDENT_AMBULATORY_CARE_PROVIDER_SITE_OTHER): Payer: Medicare PPO | Admitting: Cardiology

## 2012-05-26 ENCOUNTER — Encounter: Payer: Self-pay | Admitting: Cardiology

## 2012-05-26 VITALS — BP 184/79 | HR 68 | Ht 68.0 in | Wt 228.0 lb

## 2012-05-26 DIAGNOSIS — I10 Essential (primary) hypertension: Secondary | ICD-10-CM

## 2012-05-26 DIAGNOSIS — E782 Mixed hyperlipidemia: Secondary | ICD-10-CM

## 2012-05-26 DIAGNOSIS — I2510 Atherosclerotic heart disease of native coronary artery without angina pectoris: Secondary | ICD-10-CM

## 2012-05-26 DIAGNOSIS — I429 Cardiomyopathy, unspecified: Secondary | ICD-10-CM

## 2012-05-26 NOTE — Assessment & Plan Note (Signed)
Symptomatically stable, ECG reviewed. Plan to continue medical therapy and observation.

## 2012-05-26 NOTE — Progress Notes (Signed)
Clinical Summary Robin [name] is a 72 y.o.female last seen in September 2013. She is here with her daughter, states that she has been doing well, no cardiac hospitalizations, no chest pain or nitroglycerin use.  She had recent lab work from April noted showing potassium 4.1, BUN 28, creatinine 1.1, AST 20, ALT 19, hemoglobin 10.3, platelets 182, cholesterol 125, triglycerides 121, HDL 38, LDL 63.  Recent ECG shows sinus bradycardia, no acute ST segment changes.  Followup echocardiogram in April of last year showed moderate LVH with LVEF 60-65%, diastolic dysfunction, elevated filling pressures.  No Known Allergies  Current Outpatient Prescriptions  Medication Sig Dispense Refill  . aspirin 81 MG tablet Take 81 mg by mouth daily.        . carvedilol (COREG) 12.5 MG tablet Take 1.5 tablets (18.75 mg total) by mouth 2 (two) times daily with a meal.  90 tablet  6  . clopidogrel (PLAVIX) 75 MG tablet TAKE ONE TABLET BY MOUTH ONE TIME DAILY  30 tablet  5  . clotrimazole-betamethasone (LOTRISONE) cream Apply to affected area 2 times daily  15 g  1  . CRESTOR 40 MG tablet TAKE ONE TABLET BY MOUTH ONE TIME DAILY  30 tablet  3  . meclizine (ANTIVERT) 12.5 MG tablet Take 1 tablet (12.5 mg total) by mouth 3 (three) times daily as needed for dizziness or nausea.  30 tablet  3  . nitroGLYCERIN (NITROSTAT) 0.4 MG SL tablet Place 0.4 mg under the tongue every 5 (five) minutes as needed.        . Omega-3 Fatty Acids 300 MG CAPS Take 1 capsule (300 mg total) by mouth 2 (two) times daily.      Marland Kitchen SYNTHROID 125 MCG tablet TAKE ONE TABLET BY MOUTH  EVERY DAY  30 tablet  5  . tamoxifen (NOLVADEX) 20 MG tablet TAKE ONE TABLET BY MOUTH ONE TIME DAILY  30 tablet  3  . valsartan-hydrochlorothiazide (DIOVAN-HCT) 160-25 MG per tablet TAKE ONE TABLET BY MOUTH ONE TIME DAILY  30 tablet  3   No current facility-administered medications for this visit.    Past Medical History  Diagnosis Date  . NSTEMI (non-ST  elevated myocardial infarction)     2/12  . Cardiomyopathy     Probable Takotsubo, LVEF 30-35% 2/12  . HTN (hypertension)   . Hypothyroidism   . Hyperlipidemia   . Diabetes mellitus   . Stroke     (2000) residual left-sided weakness  . Carotid artery disease   . CAD (coronary artery disease)     DES second diagonal 2/12  . Breast cancer     Tamoxifen started 2007  . Hepatic steatosis   . Pancreas divisum     Social History Ms. Capell reports that she has never smoked. She has never used smokeless tobacco. Ms. Hargrove reports that she does not drink alcohol.  Review of Systems No palpitations, orthopnea, PND. Otherwise negative.  Physical Examination Filed Vitals:   05/26/12 0959  BP: 184/79  Pulse: 68   Filed Weights   05/26/12 0959  Weight: 228 lb (103.42 kg)    Obese woman in no acute distress.  HEENT: Conjunctiva and lids normal, oropharynx clear with moist mucosa.  Neck: Supple, no elevated JVP or carotid bruits, no thyromegaly.  Lungs: Clear to auscultation, nonlabored breathing at rest.  Cardiac: Regular rate and rhythm, no S3, 2/6 systolic murmur, no pericardial rub.  Abdomen: Soft, nontender, no hepatomegaly, bowel sounds present, no guarding or rebound.  Extremities: 1+ ankle edema bilaterally.    Problem List and Plan   Coronary atherosclerosis of native coronary artery Symptomatically stable, ECG reviewed. Plan to continue medical therapy and observation.  Secondary cardiomyopathy, unspecified Prior Takotsubo presentation, normalization of LVEF as of April 2013.  Mixed hyperlipidemia Recent LDL 63 on current regimen.  Essential hypertension, benign Blood pressure is elevated today. Better at last few visits with Dr. Jeanice Lim. I asked her to keep an eye on blood pressure, watch sodium in her diet.    Jonelle Sidle, M.D., F.A.C.C.

## 2012-05-26 NOTE — Assessment & Plan Note (Signed)
Prior Takotsubo presentation, normalization of LVEF as of April 2013.

## 2012-05-26 NOTE — Patient Instructions (Addendum)

## 2012-05-26 NOTE — Assessment & Plan Note (Signed)
Blood pressure is elevated today. Better at last few visits with Dr. Jeanice Lim. I asked her to keep an eye on blood pressure, watch sodium in her diet.

## 2012-05-26 NOTE — Assessment & Plan Note (Signed)
Recent LDL 63 on current regimen.

## 2012-06-02 ENCOUNTER — Telehealth: Payer: Self-pay | Admitting: Family Medicine

## 2012-06-02 NOTE — Telephone Encounter (Signed)
Please call pt directly, she was seeing Dr. Laurian Brim for pain management, please figure out if he is giving her medications first,  In the past I have hydrocodone/acetaminophen 5-325mg  BID prn  #30 - see if this is the same medication she is referred too

## 2012-06-02 NOTE — Telephone Encounter (Signed)
Needs appt

## 2012-06-02 NOTE — Telephone Encounter (Signed)
Dr. Jeanice Lim did have a rx for pain medicine and wants to see if DR. Weston will do it again please

## 2012-06-03 ENCOUNTER — Other Ambulatory Visit: Payer: Self-pay

## 2012-06-03 MED ORDER — HYDROCODONE-ACETAMINOPHEN 5-325 MG PO TABS: 1.0000 | ORAL_TABLET | Freq: Two times a day (BID) | ORAL | Status: DC | PRN

## 2012-06-03 NOTE — Telephone Encounter (Signed)
Patient states Dr Laurian Brim hasn't given her any pain meds. Ok to refill the hydrocodone?

## 2012-06-03 NOTE — Telephone Encounter (Signed)
Med refilled.

## 2012-07-05 ENCOUNTER — Other Ambulatory Visit: Payer: Self-pay | Admitting: Physician Assistant

## 2012-07-19 ENCOUNTER — Ambulatory Visit: Payer: Medicare PPO | Admitting: Family Medicine

## 2012-07-19 ENCOUNTER — Ambulatory Visit (INDEPENDENT_AMBULATORY_CARE_PROVIDER_SITE_OTHER): Payer: Medicare PPO | Admitting: Family Medicine

## 2012-07-19 ENCOUNTER — Encounter: Payer: Self-pay | Admitting: Family Medicine

## 2012-07-19 VITALS — BP 160/70 | HR 60 | Temp 97.1°F | Resp 16 | Wt 229.0 lb

## 2012-07-19 DIAGNOSIS — R7309 Other abnormal glucose: Secondary | ICD-10-CM

## 2012-07-19 DIAGNOSIS — E669 Obesity, unspecified: Secondary | ICD-10-CM

## 2012-07-19 DIAGNOSIS — M169 Osteoarthritis of hip, unspecified: Secondary | ICD-10-CM

## 2012-07-19 DIAGNOSIS — M1610 Unilateral primary osteoarthritis, unspecified hip: Secondary | ICD-10-CM

## 2012-07-19 DIAGNOSIS — E039 Hypothyroidism, unspecified: Secondary | ICD-10-CM

## 2012-07-19 DIAGNOSIS — R7303 Prediabetes: Secondary | ICD-10-CM

## 2012-07-19 DIAGNOSIS — I10 Essential (primary) hypertension: Secondary | ICD-10-CM

## 2012-07-19 MED ORDER — AMLODIPINE BESYLATE 5 MG PO TABS: 5.0000 mg | ORAL_TABLET | Freq: Every day | ORAL | Status: DC

## 2012-07-19 NOTE — Assessment & Plan Note (Signed)
Weight unchanged

## 2012-07-19 NOTE — Assessment & Plan Note (Signed)
Uncontrolled HTN, add norvasc 5mg 

## 2012-07-19 NOTE — Assessment & Plan Note (Signed)
Continue current hydrocodone as needed

## 2012-07-19 NOTE — Assessment & Plan Note (Signed)
Borderline DM, watch diet, no medications at this time

## 2012-07-19 NOTE — Progress Notes (Signed)
  Subjective:    Patient ID: Robin [name], female    DOB: [birth year], 72 y.o.   MRN: [id number]  HPI  Patient here to follow chronic medical problems. She has no specific concerns. She's doing well her medications. She does check her blood pressure at home with a wrist cuff. She states her blood pressure goes up and down. Her blood pressure was elevated at her cardiology visit most recently. She denies any dizziness or headache.  Osteoarthritis of hip is doing well with the use of hydrocodone as needed  Hypothyroidism her TFTs have been up and down over the past couple months with no change in her medications. I will have him repeat it today.     Review of Systems  - per above  GEN- denies fatigue, fever, weight loss,weakness, recent illness HEENT- denies eye drainage, change in vision, nasal discharge, CVS- denies chest pain, palpitations RESP- denies SOB, cough, wheeze ABD- denies N/V, change in stools, abd pain GU- denies dysuria, hematuria, dribbling, incontinence MSK- + joint pain, muscle aches, injury Neuro- denies headache, dizziness, syncope, seizure activity       Objective:   Physical Exam  GEN- NAD, alert and oriented x3  Repeat BP 162/70 HEENT- PERRL, EOMI, non injected sclera, pink conjunctiva, MMM, oropharynx clear Neck- Supple,  CVS- RRR, no murmur RESP-CTAB EXT- trace pedal edema Pulses- Radial, DP- 2+ Neuro- Left UE hemiplegia, left hemiparesis lower ext, ambulating at baseline, CNII-XII in tact        Assessment & Plan:

## 2012-07-19 NOTE — Assessment & Plan Note (Signed)
Her TFT have fluctuated multiple times, repeat TFT no recent changes

## 2012-07-19 NOTE — Patient Instructions (Addendum)
Start new blood pressure medication norvasc Recheck on thyroid today We will call if appt for cancer needed F/U 4 weeks for blood pressure

## 2012-07-20 LAB — T3, FREE: T3, Free: 2.5 pg/mL (ref 2.3–4.2)

## 2012-07-20 LAB — TSH: TSH: 0.688 u[IU]/mL (ref 0.350–4.500)

## 2012-07-26 ENCOUNTER — Other Ambulatory Visit: Payer: Self-pay | Admitting: Cardiology

## 2012-07-26 ENCOUNTER — Other Ambulatory Visit: Payer: Self-pay | Admitting: Physician Assistant

## 2012-07-26 MED ORDER — CLOPIDOGREL BISULFATE 75 MG PO TABS: 75.0000 mg | ORAL_TABLET | Freq: Every day | ORAL | Status: DC

## 2012-07-27 ENCOUNTER — Encounter: Payer: Self-pay | Admitting: Family Medicine

## 2012-08-01 ENCOUNTER — Other Ambulatory Visit: Payer: Self-pay | Admitting: Family Medicine

## 2012-08-03 ENCOUNTER — Telehealth: Payer: Self-pay | Admitting: Family Medicine

## 2012-08-03 NOTE — Telephone Encounter (Deleted)
Message copied by Samuella Cota on Tue Aug 03, 2012  9:14 AM ------      Message from: Milinda Antis F      Created: Mon Aug 02, 2012 10:42 PM      Regarding: RE: Please obtain last visit from Cancer center at [facility]       Please let pt know she does not need to see the cancer center since we have stopped the Tamoxifen and she does not need mammograms any further.       Document in chart please            ----- Message -----         From: Salley Scarlet, MD         Sent: 07/19/2012  10:25 PM           To: Salley Scarlet, MD, Samuella Cota      Subject: Please obtain last visit from Cancer center #                   ------

## 2012-08-03 NOTE — Telephone Encounter (Signed)
Pt does not need further mammograms any further and does not need to see the cancer center since we have stopped the Tamoxifen. Pt is aware.

## 2012-08-13 ENCOUNTER — Ambulatory Visit: Payer: Self-pay | Admitting: Family Medicine

## 2012-08-20 ENCOUNTER — Ambulatory Visit: Payer: Medicare PPO | Admitting: Family Medicine

## 2012-10-20 ENCOUNTER — Telehealth: Payer: Self-pay | Admitting: Family Medicine

## 2012-10-20 ENCOUNTER — Other Ambulatory Visit: Payer: Self-pay | Admitting: Family Medicine

## 2012-10-20 MED ORDER — VALSARTAN-HYDROCHLOROTHIAZIDE 160-25 MG PO TABS: ORAL_TABLET | ORAL | Status: DC

## 2012-10-20 NOTE — Telephone Encounter (Signed)
Diovan HCL 160-25 mg 1 QD #30

## 2012-10-20 NOTE — Telephone Encounter (Signed)
Rx Refilled  

## 2012-11-01 ENCOUNTER — Telehealth: Payer: Self-pay | Admitting: Family Medicine

## 2012-11-01 NOTE — Telephone Encounter (Signed)
Please call her she has several medication questions

## 2012-11-02 NOTE — Telephone Encounter (Signed)
I called the patient back and it the medication was Tamoxifen, she said she was confused about it. I told her she needed an OV and she will check her schedule and seee when she can come in.

## 2012-11-02 NOTE — Telephone Encounter (Signed)
noted 

## 2012-11-02 NOTE — Telephone Encounter (Signed)
Pt states that since she was taking off her calcuim medication she is having pains in her left arm, wants to know if she can get back on it?

## 2012-11-02 NOTE — Telephone Encounter (Signed)
I do not recall taking her off calcium recently, but this would not be the cause of any arm problems I am sure there is some confusion here She is overdue for OV anyway, please have her schedule one to be checked

## 2012-11-22 ENCOUNTER — Ambulatory Visit: Payer: Medicare PPO | Admitting: Family Medicine

## 2012-11-24 ENCOUNTER — Ambulatory Visit: Payer: Medicare PPO | Admitting: Family Medicine

## 2012-11-25 ENCOUNTER — Ambulatory Visit (INDEPENDENT_AMBULATORY_CARE_PROVIDER_SITE_OTHER): Payer: Medicare PPO | Admitting: Cardiology

## 2012-11-25 ENCOUNTER — Encounter: Payer: Self-pay | Admitting: Cardiology

## 2012-11-25 VITALS — BP 187/93 | HR 78 | Ht 66.0 in | Wt 231.0 lb

## 2012-11-25 DIAGNOSIS — I10 Essential (primary) hypertension: Secondary | ICD-10-CM

## 2012-11-25 DIAGNOSIS — E782 Mixed hyperlipidemia: Secondary | ICD-10-CM

## 2012-11-25 DIAGNOSIS — I429 Cardiomyopathy, unspecified: Secondary | ICD-10-CM

## 2012-11-25 DIAGNOSIS — I2510 Atherosclerotic heart disease of native coronary artery without angina pectoris: Secondary | ICD-10-CM

## 2012-11-25 NOTE — Assessment & Plan Note (Signed)
Blood pressure is up today, may be component of whitecoat hypertension. I asked her to keep an eye on the blood pressure trend at home. She is on a good regimen.

## 2012-11-25 NOTE — Progress Notes (Signed)
Clinical Summary Robin [name] is a 72 y.o.female last seen in May. She is here with her daughter. [name] well from a cardiac perspective, NYHA class II dyspnea, no chest pain or palpitations. Blood pressure is up today, but she states that she checks at home and systolics are usually in the 130s.  Lab work from earlier in the year showed LDL 63.  Followup echocardiogram in April of last year showed moderate LVH with LVEF 60-65%, diastolic dysfunction, elevated filling pressures. Weight is relatively stable.   No Known Allergies  Current Outpatient Prescriptions  Medication Sig Dispense Refill  . amLODipine (NORVASC) 5 MG tablet Take 1 tablet (5 mg total) by mouth daily. For blood pressure  90 tablet  3  . aspirin 81 MG tablet Take 81 mg by mouth daily.        . carvedilol (COREG) 12.5 MG tablet TAKE ONE AND ONE-HALF TABLET BY MOUTH TWICE DAILY WITH A MEAL  90 tablet  6  . clopidogrel (PLAVIX) 75 MG tablet Take 1 tablet (75 mg total) by mouth daily.  30 tablet  5  . clotrimazole-betamethasone (LOTRISONE) cream Apply to affected area 2 times daily  15 g  1  . CRESTOR 40 MG tablet TAKE ONE TABLET BY MOUTH ONE TIME DAILY  30 tablet  3  . DOCQLACE 100 MG capsule Take 100 mg by mouth 2 (two) times daily as needed.       . nitroGLYCERIN (NITROSTAT) 0.4 MG SL tablet Place 0.4 mg under the tongue every 5 (five) minutes as needed.        . Omega-3 Fatty Acids 300 MG CAPS Take 1 capsule (300 mg total) by mouth 2 (two) times daily.      Marland Kitchen SYNTHROID 125 MCG tablet TAKE ONE TABLET BY MOUTH  EVERY DAY  30 tablet  5  . valsartan-hydrochlorothiazide (DIOVAN-HCT) 160-25 MG per tablet TAKE ONE TABLET BY MOUTH ONE TIME DAILY  30 tablet  0   No current facility-administered medications for this visit.    Past Medical History  Diagnosis Date  . NSTEMI (non-ST elevated myocardial infarction)     2/12  . Cardiomyopathy     Probable Takotsubo, LVEF 30-35% 2/12  . HTN (hypertension)   . Hypothyroidism    . Hyperlipidemia   . Diabetes mellitus   . Stroke     (2000) residual left-sided weakness  . Carotid artery disease   . CAD (coronary artery disease)     DES second diagonal 2/12  . Breast cancer     Tamoxifen started 2007  . Hepatic steatosis   . Pancreas divisum     Past Surgical History  Procedure Laterality Date  . Carotid endarterectomy      Left  . Mastectomy      Bilateral  . Abdominal hysterectomy    . Cholecystectomy    . Joint replacement      Right knee replacement- [facility]. Theodoro [name]    Social History Robin [name] reports that she has never smoked. She has never used smokeless tobacco. Robin [name] reports that she does not drink alcohol.  Review of Systems Negative except as outlined.  Physical Examination Filed Vitals:   11/25/12 1404  BP: 187/93  Pulse: 78   Filed Weights   11/25/12 1404  Weight: 231 lb (104.781 kg)    Obese woman in no acute distress.  HEENT: Conjunctiva and lids normal, oropharynx clear with moist mucosa.  Neck: Supple, no elevated JVP or carotid  bruits, no thyromegaly.  Lungs: Clear to auscultation, nonlabored breathing at rest.  Cardiac: Regular rate and rhythm, no S3, 2/6 systolic murmur, no pericardial rub.  Abdomen: Soft, nontender, no hepatomegaly, bowel sounds present, no guarding or rebound.  Extremities: 1+ ankle edema bilaterally.    Problem List and Plan   Coronary atherosclerosis of native coronary artery No active angina symptoms. Continue medical therapy and observation.  Secondary cardiomyopathy, unspecified Probable Takotsubo cardiomyopathy with subsequent normalization of LV on medical therapy.  Mixed hyperlipidemia Lipids have been well controlled, continue statin therapy.  Essential hypertension, benign Blood pressure is up today, may be component of whitecoat hypertension. I asked her to keep an eye on the blood pressure trend at home. She is on a good regimen.    Robin [name],  M.D., F.A.C.C.

## 2012-11-25 NOTE — Assessment & Plan Note (Signed)
Probable Takotsubo cardiomyopathy with subsequent normalization of LV on medical therapy. 

## 2012-11-25 NOTE — Assessment & Plan Note (Signed)
No active angina symptoms. Continue medical therapy and observation. 

## 2012-11-25 NOTE — Assessment & Plan Note (Signed)
Lipids have been well controlled, continue statin therapy.

## 2012-11-25 NOTE — Patient Instructions (Signed)
Your physician recommends that you schedule a follow-up appointment in: 6 months with Dr. Diona Browner. You should receive a letter in the mail in 4 months. If you do not receive this letter by March 2015 call our office to schedule this appointment.   Your physician recommends that you continue on your current medications as directed. Please refer to the Current Medication list given to you today.

## 2012-11-30 ENCOUNTER — Other Ambulatory Visit: Payer: Self-pay | Admitting: *Deleted

## 2012-11-30 MED ORDER — LEVOTHYROXINE SODIUM 125 MCG PO TABS: ORAL_TABLET | ORAL | Status: DC

## 2012-11-30 NOTE — Telephone Encounter (Signed)
Med refilled.

## 2012-12-01 ENCOUNTER — Ambulatory Visit (INDEPENDENT_AMBULATORY_CARE_PROVIDER_SITE_OTHER): Payer: Medicare PPO | Admitting: Family Medicine

## 2012-12-01 ENCOUNTER — Encounter: Payer: Self-pay | Admitting: Family Medicine

## 2012-12-01 VITALS — BP 140/70 | HR 68 | Temp 98.7°F | Resp 18 | Ht 65.5 in | Wt 232.0 lb

## 2012-12-01 DIAGNOSIS — I10 Essential (primary) hypertension: Secondary | ICD-10-CM

## 2012-12-01 DIAGNOSIS — E782 Mixed hyperlipidemia: Secondary | ICD-10-CM

## 2012-12-01 DIAGNOSIS — E039 Hypothyroidism, unspecified: Secondary | ICD-10-CM

## 2012-12-01 DIAGNOSIS — Z23 Encounter for immunization: Secondary | ICD-10-CM

## 2012-12-01 MED ORDER — CARVEDILOL 12.5 MG PO TABS: ORAL_TABLET | ORAL | Status: DC

## 2012-12-01 MED ORDER — ROSUVASTATIN CALCIUM 40 MG PO TABS: ORAL_TABLET | ORAL | Status: DC

## 2012-12-01 MED ORDER — LEVOTHYROXINE SODIUM 125 MCG PO TABS: ORAL_TABLET | ORAL | Status: DC

## 2012-12-01 NOTE — Assessment & Plan Note (Signed)
She does well on her current dose of Synthroid we will recheck this at the next visit

## 2012-12-01 NOTE — Assessment & Plan Note (Signed)
Will plan to have fasting lipids checked before next visit. Of note I gave her some Crestor from the office

## 2012-12-01 NOTE — Progress Notes (Signed)
  Subjective:    Patient ID: Robin [name], female    DOB: 06/13/1940, 72 y.o.   MRN: [id number]  HPI Patient here to follow chronic medical problems. She has no specific concerns today. She was evaluated by her cardiologist things look well with the exception of her blood pressure being elevated. She states her blood pressure at home which she takes with a wrist cuff have been 150-160 systolic. Medications were reviewed Flu shot declined   Review of Systems  GEN- denies fatigue, fever, weight loss,weakness, recent illness HEENT- denies eye drainage, change in vision, nasal discharge, CVS- denies chest pain, palpitations RESP- denies SOB, cough, wheeze ABD- denies N/V, change in stools, abd pain GU- denies dysuria, hematuria, dribbling, incontinence MSK- denies joint pain, muscle aches, injury Neuro- denies headache, dizziness, syncope, seizure activity      Objective:   Physical Exam GEN- NAD, alert and oriented x3   HEENT- PERRL, EOMI, non injected sclera, pink conjunctiva, MMM, oropharynx clear Neck- Supple,  CVS- RRR, no murmur RESP-CTAB EXT- No edema Pulses- Radial, DP- 2+ Neuro- Left UE hemiplegia, left hemiparesis lower ext, ambulating at baseline,        Assessment & Plan:

## 2012-12-01 NOTE — Patient Instructions (Signed)
Continue current medications Pneumonia 13 vaccine given F/U 4 months

## 2012-12-01 NOTE — Assessment & Plan Note (Signed)
Blood pressure taken with a thigh cuff was much improved. She will continue her current regimen.

## 2013-01-20 ENCOUNTER — Other Ambulatory Visit: Payer: Self-pay | Admitting: Family Medicine

## 2013-02-10 ENCOUNTER — Other Ambulatory Visit: Payer: Self-pay | Admitting: Cardiology

## 2013-02-21 ENCOUNTER — Telehealth: Payer: Self-pay | Admitting: Family Medicine

## 2013-02-21 MED ORDER — AMLODIPINE BESYLATE 5 MG PO TABS: 5.0000 mg | ORAL_TABLET | Freq: Every day | ORAL | Status: DC

## 2013-02-21 NOTE — Telephone Encounter (Signed)
Pt is needing a refill on her amlodipine for one month Call back number is [phone] Pharmacy is [facility] in [place]

## 2013-02-21 NOTE — Telephone Encounter (Signed)
Med(s) refilled, for one month

## 2013-03-01 ENCOUNTER — Telehealth: Payer: Self-pay | Admitting: Family Medicine

## 2013-03-01 NOTE — Telephone Encounter (Signed)
Call back number is [phone] Pt is needing a refill on the arthritis pain medication daughter is not sure of the  Name of the medication Pharmacy [facility] in [place]

## 2013-03-03 NOTE — Telephone Encounter (Signed)
Pt daughtr called back and stated needs a refill on Hydrocodone, pt last ov 11/2012 and last refill 09/18/2011

## 2013-03-04 MED ORDER — HYDROCODONE-ACETAMINOPHEN 5-325 MG PO TABS: 1.0000 | ORAL_TABLET | Freq: Two times a day (BID) | ORAL | Status: DC | PRN

## 2013-03-04 NOTE — Telephone Encounter (Signed)
Medication refilled, someone will need to pick up

## 2013-03-04 NOTE — Telephone Encounter (Signed)
Daughter aware script ready for pick up

## 2013-04-04 ENCOUNTER — Ambulatory Visit (INDEPENDENT_AMBULATORY_CARE_PROVIDER_SITE_OTHER): Payer: Medicare PPO | Admitting: Family Medicine

## 2013-04-04 ENCOUNTER — Encounter: Payer: Self-pay | Admitting: Family Medicine

## 2013-04-04 VITALS — BP 142/74 | HR 64 | Temp 98.3°F | Resp 12 | Ht 66.5 in | Wt 237.0 lb

## 2013-04-04 DIAGNOSIS — I2510 Atherosclerotic heart disease of native coronary artery without angina pectoris: Secondary | ICD-10-CM

## 2013-04-04 DIAGNOSIS — R7309 Other abnormal glucose: Secondary | ICD-10-CM

## 2013-04-04 DIAGNOSIS — R7303 Prediabetes: Secondary | ICD-10-CM

## 2013-04-04 DIAGNOSIS — I10 Essential (primary) hypertension: Secondary | ICD-10-CM

## 2013-04-04 DIAGNOSIS — E782 Mixed hyperlipidemia: Secondary | ICD-10-CM

## 2013-04-04 DIAGNOSIS — M1610 Unilateral primary osteoarthritis, unspecified hip: Secondary | ICD-10-CM

## 2013-04-04 DIAGNOSIS — M169 Osteoarthritis of hip, unspecified: Secondary | ICD-10-CM

## 2013-04-04 DIAGNOSIS — E039 Hypothyroidism, unspecified: Secondary | ICD-10-CM

## 2013-04-04 LAB — TSH
TSH: 6.573 u[IU]/mL — ABNORMAL HIGH (ref 0.350–4.500)
TSH: 6.649 u[IU]/mL — ABNORMAL HIGH (ref 0.350–4.500)

## 2013-04-04 LAB — LIPID PANEL
Cholesterol: 159 mg/dL (ref 0–200)
HDL: 42 mg/dL (ref >39)
LDL Cholesterol: 95 mg/dL (ref 0–99)
Total CHOL/HDL Ratio: 3.8 Ratio
Triglycerides: 110 mg/dL (ref <150)
VLDL: 22 mg/dL (ref 0–40)

## 2013-04-04 LAB — CBC
HCT: 33.2 % — ABNORMAL LOW (ref 36.0–46.0)
Hemoglobin: 11 g/dL — ABNORMAL LOW (ref 12.0–15.0)
MCH: 25.2 pg — ABNORMAL LOW (ref 26.0–34.0)
MCHC: 33.1 g/dL (ref 30.0–36.0)
MCV: 76 fL — ABNORMAL LOW (ref 78.0–100.0)
Platelets: 191 10*3/uL (ref 150–400)
RBC: 4.37 MIL/uL (ref 3.87–5.11)
RDW: 16.9 % — ABNORMAL HIGH (ref 11.5–15.5)
WBC: 9.9 10*3/uL (ref 4.0–10.5)

## 2013-04-04 LAB — COMPREHENSIVE METABOLIC PANEL
ALT: 20 U/L (ref 0–35)
AST: 30 U/L (ref 0–37)
Albumin: 4.2 g/dL (ref 3.5–5.2)
Alkaline Phosphatase: 80 U/L (ref 39–117)
BUN: 24 mg/dL — ABNORMAL HIGH (ref 6–23)
CO2: 17 mEq/L — ABNORMAL LOW (ref 19–32)
Calcium: 9.6 mg/dL (ref 8.4–10.5)
Chloride: 104 mEq/L (ref 96–112)
Creat: 1.23 mg/dL — ABNORMAL HIGH (ref 0.50–1.10)
Glucose, Bld: 93 mg/dL (ref 70–99)
Potassium: 4.4 mEq/L (ref 3.5–5.3)
Sodium: 137 mEq/L (ref 135–145)
Total Bilirubin: 0.4 mg/dL (ref 0.3–1.2)
Total Protein: 7.6 g/dL (ref 6.0–8.3)

## 2013-04-04 LAB — T4, FREE: Free T4: 0.66 ng/dL — ABNORMAL LOW (ref 0.80–1.80)

## 2013-04-04 LAB — T3, FREE: T3 FREE: 2.3 pg/mL (ref 2.3–4.2)

## 2013-04-04 NOTE — Assessment & Plan Note (Signed)
Continue current pain medication regimen

## 2013-04-04 NOTE — Assessment & Plan Note (Signed)
Blood pressure is much improved today I will continue her current medications based on her age and her comorbidities.

## 2013-04-04 NOTE — Assessment & Plan Note (Signed)
Check thyroid function 

## 2013-04-04 NOTE — Assessment & Plan Note (Signed)
Check lipid panel on Crestor

## 2013-04-04 NOTE — Patient Instructions (Signed)
Continue current medications Labs today F/U 4 months

## 2013-04-04 NOTE — Progress Notes (Signed)
Patient ID: Robin [name], female   DOB: [birth year], 73 y.o.   MRN: [id number]   Subjective:    Patient ID: Robin [name], female    DOB: [birth year], 73 y.o.   MRN: [id number]  Patient presents for 4 month F/U  patient here to follow chronic medical problems. She has no specific concerns today. Overall she is doing well. She's not had any recent falls. She does get arthritis pain and takes hydrocodone if needed. She's here today with her daughter. Her last cardiology note was reviewed her blood pressure was significantly high at that visit however much improved in our office. There is some concern she is a component of whitecoat hypertension. Prediabetes she is due for fasting labs. Indications reviewed at bedside.    Review Of Systems:  GEN- denies fatigue, fever, weight loss,weakness, recent illness HEENT- denies eye drainage, change in vision, nasal discharge, CVS- denies chest pain, palpitations RESP- denies SOB, cough, wheeze ABD- denies N/V, change in stools, abd pain GU- denies dysuria, hematuria, dribbling, incontinence MSK-+ joint pain, muscle aches, injury Neuro- denies headache, dizziness, syncope, seizure activity       Objective:    BP 142/74  Pulse 64  Temp(Src) 98.3 F (36.8 C)  Resp 12  Ht 5' 6.5" (1.689 m)  Wt 237 lb (107.502 kg)  BMI 37.68 kg/m2 GEN- NAD, alert and oriented x3   HEENT- PERRL, EOMI, non injected sclera, pink conjunctiva, MMM, oropharynx clear Neck- Supple, no thyromegaly CVS- RRR, no murmur RESP-CTAB EXT- +pedal edema Pulses- Radial, DP- 2+ Neuro- Left UE hemiplegia, left hemiparesis lower ext, ambulating at baseline, +lymphedema LUE      Assessment & Plan:      Problem List Items Addressed This Visit   Hypothyroidism      Note: This dictation was prepared with Dragon dictation along with smaller phrase technology. Any transcriptional errors that result from this process are unintentional.

## 2013-04-04 NOTE — Assessment & Plan Note (Signed)
Reviewed cardiology note

## 2013-04-04 NOTE — Assessment & Plan Note (Signed)
Recheck A1c 

## 2013-04-05 ENCOUNTER — Other Ambulatory Visit: Payer: Self-pay | Admitting: *Deleted

## 2013-04-05 MED ORDER — LEVOTHYROXINE SODIUM 150 MCG PO TABS: 150.0000 ug | ORAL_TABLET | Freq: Every day | ORAL | Status: DC

## 2013-04-05 NOTE — Telephone Encounter (Signed)
Per orders noted on labs, medication sent to pharmacy.   Call placed to patient and patient made aware. 

## 2013-04-11 ENCOUNTER — Encounter: Payer: Self-pay | Admitting: *Deleted

## 2013-04-11 ENCOUNTER — Telehealth: Payer: Self-pay | Admitting: Family Medicine

## 2013-04-11 MED ORDER — VALSARTAN-HYDROCHLOROTHIAZIDE 160-25 MG PO TABS: ORAL_TABLET | ORAL | Status: DC

## 2013-04-11 NOTE — Telephone Encounter (Signed)
This encounter was created in error - please disregard.

## 2013-04-11 NOTE — Telephone Encounter (Signed)
Call back number is [phone] Pharmacy [facility] Pt daughter is calling because the pt has never got her fluid pills that was suppose to be sent to pharmacy.

## 2013-04-11 NOTE — Telephone Encounter (Signed)
Call placed to patient and patient made aware.  

## 2013-04-27 ENCOUNTER — Telehealth: Payer: Self-pay | Admitting: Family Medicine

## 2013-04-27 NOTE — Telephone Encounter (Signed)
[phone] Robin [name] is calling because the valsartan-hydrochlorothiazide (DIOVAN-HCT) 160-25 MG per tablet the pt is taking the pt is still have swelling in her feet during the day but at night it goes down, what does the pt need to do?

## 2013-04-27 NOTE — Telephone Encounter (Signed)
Call placed to patient.   Reports that when she gets up in the morning her feet are normal, but by the end of the day she noticies that the tops of her feet are swollen.   Reports that she only occasionally elevates feet.   Advised to elevate feet and legs at least 3 times during the day for 15 minutes.  MD please advise.

## 2013-04-29 NOTE — Telephone Encounter (Signed)
Noted, no ankle or leg swelling , appt on Monday

## 2013-04-29 NOTE — Telephone Encounter (Signed)
Call returned to patient.   Reports that swelling continues even though she is elevating legs.   Appointment scheduled for Monday, 05/02/2013.

## 2013-05-02 ENCOUNTER — Ambulatory Visit: Payer: Self-pay | Admitting: Family Medicine

## 2013-05-03 ENCOUNTER — Encounter: Payer: Self-pay | Admitting: Family Medicine

## 2013-05-03 ENCOUNTER — Ambulatory Visit (INDEPENDENT_AMBULATORY_CARE_PROVIDER_SITE_OTHER): Payer: Medicare PPO | Admitting: Family Medicine

## 2013-05-03 VITALS — BP 148/82 | HR 74 | Temp 97.5°F | Resp 16 | Ht 67.0 in | Wt 238.0 lb

## 2013-05-03 DIAGNOSIS — R609 Edema, unspecified: Secondary | ICD-10-CM

## 2013-05-03 DIAGNOSIS — I10 Essential (primary) hypertension: Secondary | ICD-10-CM

## 2013-05-03 MED ORDER — FUROSEMIDE 40 MG PO TABS: 40.0000 mg | ORAL_TABLET | Freq: Every day | ORAL | Status: DC

## 2013-05-03 NOTE — Patient Instructions (Signed)
Take the lasix 1 tablet once a day, through Friday Continue all other medications We will call call on Friday to recheck your swelling  Wear your knee hose with support F/U as previous

## 2013-05-04 ENCOUNTER — Encounter: Payer: Self-pay | Admitting: Family Medicine

## 2013-05-04 NOTE — Assessment & Plan Note (Signed)
BP looks okay. 

## 2013-05-04 NOTE — Progress Notes (Signed)
Patient ID: Robin [name], female   DOB: 01/08/1941, 73 y.o.   MRN: [id number]   Subjective:    Patient ID: Robin [name], female    DOB: 1940/10/11, 73 y.o.   MRN: [id number]  Patient presents for B foot swelling  Bilat foot and ankle swelling for past 3 days, has pain due to tightness, no CP, no SOB, no recent, falls, no recent medication changes.  Elevates feet during the day, due to stroke unable to put any compression hose on    Review Of Systems:  GEN- denies fatigue, fever, weight loss,weakness, recent illness HEENT- denies eye drainage, change in vision, nasal discharge, CVS- denies chest pain, palpitations RESP- denies SOB, cough, wheeze Neuro- denies headache, dizziness, syncope, seizure activity       Objective:    BP 148/82  Pulse 74  Temp(Src) 97.5 F (36.4 C) (Oral)  Resp 16  Ht 5\' 7"  (1.702 m)  Wt 238 lb (107.956 kg)  BMI 37.27 kg/m2 GEN- NAD, alert and oriented x3   HEENT- PERRL, EOMI, non injected sclera, pink conjunctiva, MMM, oropharynx clear Neck- Supple, no JVD CVS- RRR, no murmur RESP-CTAB EXT-  1+ edema to shins  Pulses- Radial, DP- equal bilat       Assessment & Plan:      Problem List Items Addressed This Visit   None      Note: This dictation was prepared with Dragon dictation along with smaller phrase technology. Any transcriptional errors that result from this process are unintentional.

## 2013-05-04 NOTE — Assessment & Plan Note (Signed)
Acute worsening edema on Diovan HCTZ , however will give lasix 40mg  for next 3 days and f/u via phone, if she requires on regular basis will change to diovan without the [place] Discussed se of medications she prefers more symptom control and comfort Unable to use graded compression hose as she can not put on Has support hose

## 2013-05-09 ENCOUNTER — Telehealth: Payer: Self-pay | Admitting: *Deleted

## 2013-05-09 NOTE — Telephone Encounter (Signed)
Call placed to patient.   Reports taht swelling has decreased in B legs.   Advised of MD recommendation.  Verbalized understanding.

## 2013-05-09 NOTE — Telephone Encounter (Signed)
Message copied by Sheral Flow on Mon May 09, 2013  4:29 PM ------      Message from: Vic Blackbird F      Created: Mon May 09, 2013 11:43 AM      Regarding: FW: Call and check on foot       Call and see how her legs are with the water pill, if it is better, have her use it only as needed       ----- Message -----         From: Alycia Rossetti, MD         Sent: 05/04/2013   2:06 PM           To: Alycia Rossetti, MD      Subject: Call and check on foot                                          ------

## 2013-05-09 NOTE — Telephone Encounter (Signed)
noted 

## 2013-06-01 ENCOUNTER — Ambulatory Visit (INDEPENDENT_AMBULATORY_CARE_PROVIDER_SITE_OTHER): Payer: Medicare PPO | Admitting: Cardiology

## 2013-06-01 ENCOUNTER — Encounter: Payer: Self-pay | Admitting: Cardiology

## 2013-06-01 VITALS — BP 187/92 | HR 70 | Ht 66.0 in | Wt 238.8 lb

## 2013-06-01 DIAGNOSIS — E782 Mixed hyperlipidemia: Secondary | ICD-10-CM

## 2013-06-01 DIAGNOSIS — I429 Cardiomyopathy, unspecified: Secondary | ICD-10-CM

## 2013-06-01 DIAGNOSIS — I2510 Atherosclerotic heart disease of native coronary artery without angina pectoris: Secondary | ICD-10-CM

## 2013-06-01 DIAGNOSIS — I10 Essential (primary) hypertension: Secondary | ICD-10-CM

## 2013-06-01 NOTE — Assessment & Plan Note (Signed)
She continues on Crestor, followed by Dr. Buelah Manis. LDL 95 by most recent check.

## 2013-06-01 NOTE — Progress Notes (Signed)
Clinical Summary Ms. Villescas is a 73 y.o.female last seen in November 2014. She is here with her daughter, doing well from a cardiac perspective. No chest pain or shortness of breath. Still has some trouble with chronic leg edema. She has been staying active, going outside and planting flowers.  I reviewed Dr. Dorian Heckle last few notes, blood pressure is generally better controlled, systolics were in the 818E. She reports compliance with her medication.  Followup echocardiogram in April 2013 showed moderate LVH with LVEF 99-37%, diastolic dysfunction, elevated filling pressures.   No Known Allergies  Current Outpatient Prescriptions  Medication Sig Dispense Refill  . amLODipine (NORVASC) 5 MG tablet Take 1 tablet (5 mg total) by mouth daily. For blood pressure  30 tablet  1  . aspirin 81 MG tablet Take 81 mg by mouth daily.        . carvedilol (COREG) 12.5 MG tablet TAKE ONE AND ONE-HALF TABLET BY MOUTH TWICE DAILY WITH A MEAL  225 tablet  1  . clopidogrel (PLAVIX) 75 MG tablet TAKE ONE TABLET BY MOUTH ONE TIME DAILY   30 tablet  4  . docusate sodium (COLACE) 100 MG capsule Take 100 mg by mouth as needed for mild constipation.      . furosemide (LASIX) 40 MG tablet Take 1 tablet (40 mg total) by mouth daily.  30 tablet  3  . HYDROcodone-acetaminophen (NORCO/VICODIN) 5-325 MG per tablet Take 1 tablet by mouth 2 (two) times daily as needed for moderate pain.  45 tablet  0  . levothyroxine (SYNTHROID, LEVOTHROID) 150 MCG tablet Take 1 tablet (150 mcg total) by mouth daily.  90 tablet  3  . nitroGLYCERIN (NITROSTAT) 0.4 MG SL tablet Place 0.4 mg under the tongue every 5 (five) minutes as needed.        . Omega-3 Fatty Acids 300 MG CAPS Take 1 capsule (300 mg total) by mouth 2 (two) times daily.      . rosuvastatin (CRESTOR) 40 MG tablet TAKE ONE TABLET BY MOUTH ONE TIME DAILY  30 tablet  6  . valsartan-hydrochlorothiazide (DIOVAN-HCT) 160-25 MG per tablet TAKE ONE TABLET BY MOUTH ONE TIME  DAILY  30 tablet  1   No current facility-administered medications for this visit.    Past Medical History  Diagnosis Date  . NSTEMI (non-ST elevated myocardial infarction)     2/12  . Cardiomyopathy     Probable Takotsubo, LVEF 30-35% 2/12  . HTN (hypertension)   . Hypothyroidism   . Hyperlipidemia   . Diabetes mellitus   . Stroke     (2000) residual left-sided weakness  . Carotid artery disease   . CAD (coronary artery disease)     DES second diagonal 2/12  . Breast cancer     Tamoxifen started 2007  . Hepatic steatosis   . Pancreas divisum     Social History Ms. Houpt reports that she has never smoked. She has never used smokeless tobacco. Ms. Nagorski reports that she does not drink alcohol.  Review of Systems No palpitations or syncope. Otherwise as outlined.  Physical Examination Filed Vitals:   06/01/13 1141  BP: 187/92  Pulse: 70   Filed Weights   06/01/13 1141  Weight: 238 lb 12.8 oz (108.319 kg)   Obese woman in no acute distress.  HEENT: Conjunctiva and lids normal, oropharynx clear with moist mucosa.  Neck: Supple, no elevated JVP or carotid bruits, no thyromegaly.  Lungs: Clear to auscultation, nonlabored breathing at rest.  Cardiac: Regular rate and rhythm, no S3, 2/6 systolic murmur, no pericardial rub.  Abdomen: Soft, nontender, no hepatomegaly, bowel sounds present, no guarding or rebound.  Extremities: 1+ ankle edema bilaterally.    Problem List and Plan   Coronary atherosclerosis of native coronary artery No active angina symptoms. Continue medical therapy and observation.  Essential hypertension, benign No change to current regimen. Her blood pressure has been elevated during our visits, but typically not the case on other evaluations including home checks.  Mixed hyperlipidemia She continues on Crestor, followed by Dr. Buelah Manis. LDL 95 by most recent check.  Secondary cardiomyopathy, unspecified Probable Takotsubo cardiomyopathy  with subsequent normalization of LV on medical therapy.    Satira Sark, M.D., F.A.C.C.

## 2013-06-01 NOTE — Patient Instructions (Signed)

## 2013-06-01 NOTE — Assessment & Plan Note (Signed)
Probable Takotsubo cardiomyopathy with subsequent normalization of LV on medical therapy.

## 2013-06-01 NOTE — Assessment & Plan Note (Signed)
No change to current regimen. Her blood pressure has been elevated during our visits, but typically not the case on other evaluations including home checks.

## 2013-06-01 NOTE — Assessment & Plan Note (Signed)
No active angina symptoms. Continue medical therapy and observation. 

## 2013-06-16 ENCOUNTER — Other Ambulatory Visit: Payer: Self-pay | Admitting: Family Medicine

## 2013-06-16 NOTE — Telephone Encounter (Signed)
Refill appropriate and filled per protocol. 

## 2013-06-23 ENCOUNTER — Other Ambulatory Visit: Payer: Self-pay | Admitting: Family Medicine

## 2013-06-23 NOTE — Telephone Encounter (Signed)
Refill appropriate and filled per protocol. 

## 2013-08-05 ENCOUNTER — Ambulatory Visit: Payer: Medicare PPO | Admitting: Family Medicine

## 2013-08-09 ENCOUNTER — Ambulatory Visit (INDEPENDENT_AMBULATORY_CARE_PROVIDER_SITE_OTHER): Payer: Medicare PPO | Admitting: Family Medicine

## 2013-08-09 ENCOUNTER — Encounter: Payer: Self-pay | Admitting: Family Medicine

## 2013-08-09 VITALS — BP 144/82 | HR 68 | Temp 97.8°F | Resp 12 | Ht 67.0 in | Wt 240.0 lb

## 2013-08-09 DIAGNOSIS — E782 Mixed hyperlipidemia: Secondary | ICD-10-CM

## 2013-08-09 DIAGNOSIS — I2510 Atherosclerotic heart disease of native coronary artery without angina pectoris: Secondary | ICD-10-CM

## 2013-08-09 DIAGNOSIS — I429 Cardiomyopathy, unspecified: Secondary | ICD-10-CM

## 2013-08-09 DIAGNOSIS — R609 Edema, unspecified: Secondary | ICD-10-CM

## 2013-08-09 DIAGNOSIS — I10 Essential (primary) hypertension: Secondary | ICD-10-CM

## 2013-08-09 DIAGNOSIS — R7309 Other abnormal glucose: Secondary | ICD-10-CM

## 2013-08-09 DIAGNOSIS — E669 Obesity, unspecified: Secondary | ICD-10-CM

## 2013-08-09 DIAGNOSIS — E039 Hypothyroidism, unspecified: Secondary | ICD-10-CM

## 2013-08-09 DIAGNOSIS — R7303 Prediabetes: Secondary | ICD-10-CM

## 2013-08-09 LAB — COMPREHENSIVE METABOLIC PANEL
ALT: 13 U/L (ref 0–35)
AST: 22 U/L (ref 0–37)
Albumin: 4.2 g/dL (ref 3.5–5.2)
Alkaline Phosphatase: 79 U/L (ref 39–117)
BUN: 19 mg/dL (ref 6–23)
CO2: 26 meq/L (ref 19–32)
Calcium: 9.5 mg/dL (ref 8.4–10.5)
Chloride: 103 mEq/L (ref 96–112)
Creat: 1.15 mg/dL — ABNORMAL HIGH (ref 0.50–1.10)
GLUCOSE: 104 mg/dL — AB (ref 70–99)
Potassium: 4.1 mEq/L (ref 3.5–5.3)
SODIUM: 139 meq/L (ref 135–145)
TOTAL PROTEIN: 7.3 g/dL (ref 6.0–8.3)
Total Bilirubin: 0.4 mg/dL (ref 0.2–1.2)

## 2013-08-09 LAB — LIPID PANEL
Cholesterol: 149 mg/dL (ref 0–200)
HDL: 45 mg/dL (ref >39)
LDL Cholesterol: 82 mg/dL (ref 0–99)
Total CHOL/HDL Ratio: 3.3 Ratio
Triglycerides: 108 mg/dL (ref <150)
VLDL: 22 mg/dL (ref 0–40)

## 2013-08-09 LAB — CBC WITH DIFFERENTIAL/PLATELET
Basophils Absolute: 0 10*3/uL (ref 0.0–0.1)
Basophils Relative: 0 % (ref 0–1)
EOS PCT: 4 % (ref 0–5)
Eosinophils Absolute: 0.3 10*3/uL (ref 0.0–0.7)
HCT: 33.7 % — ABNORMAL LOW (ref 36.0–46.0)
HEMOGLOBIN: 11 g/dL — AB (ref 12.0–15.0)
LYMPHS ABS: 1.7 10*3/uL (ref 0.7–4.0)
Lymphocytes Relative: 22 % (ref 12–46)
MCH: 24.9 pg — ABNORMAL LOW (ref 26.0–34.0)
MCHC: 32.6 g/dL (ref 30.0–36.0)
MCV: 76.2 fL — ABNORMAL LOW (ref 78.0–100.0)
MONOS PCT: 6 % (ref 3–12)
Monocytes Absolute: 0.5 10*3/uL (ref 0.1–1.0)
Neutro Abs: 5.2 10*3/uL (ref 1.7–7.7)
Neutrophils Relative %: 68 % (ref 43–77)
Platelets: 199 10*3/uL (ref 150–400)
RBC: 4.42 MIL/uL (ref 3.87–5.11)
RDW: 17.4 % — ABNORMAL HIGH (ref 11.5–15.5)
WBC: 7.6 10*3/uL (ref 4.0–10.5)

## 2013-08-09 LAB — HEMOGLOBIN A1C
HEMOGLOBIN A1C: 6.3 % — AB (ref <5.7)
MEAN PLASMA GLUCOSE: 134 mg/dL — AB (ref <117)

## 2013-08-09 LAB — T3, FREE: T3, Free: 2.4 pg/mL (ref 2.3–4.2)

## 2013-08-09 LAB — TSH: TSH: 8.7 u[IU]/mL — AB (ref 0.350–4.500)

## 2013-08-09 LAB — T4, FREE: FREE T4: 0.81 ng/dL (ref 0.80–1.80)

## 2013-08-09 MED ORDER — HYDROCODONE-ACETAMINOPHEN 5-325 MG PO TABS: 1.0000 | ORAL_TABLET | Freq: Two times a day (BID) | ORAL | Status: DC | PRN

## 2013-08-09 NOTE — Assessment & Plan Note (Signed)
Continue crestor 

## 2013-08-09 NOTE — Assessment & Plan Note (Signed)
Recheck her thyroid function tests

## 2013-08-09 NOTE — Assessment & Plan Note (Signed)
BP improved today, will have her take a few doses of lasix, continue curent BP meds based on age we would be good to keep her between 035-009 systolic

## 2013-08-09 NOTE — Patient Instructions (Signed)
Continue current medications We will call with lab results Referral to [name] F/U 4 monts

## 2013-08-09 NOTE — Progress Notes (Signed)
Patient ID: Robin [name], female   DOB: 1940/03/23, 73 y.o.   MRN: [id number]   Subjective:    Patient ID: Robin [name], female    DOB: Jan 08, 1941, 73 y.o.   MRN: [id number]  Patient presents for 4 month F/U  patient here to follow chronic medical problems. She's no specific concerns today. She's taken her medication as prescribed and she does not take the Lasix on a regular basis. She's due for repeat fasting labs. She's also due to see cardiology  medications were reviewed. She's doing well with her arthritis hydrocodone which she uses very sparingly.     Review Of Systems:  GEN- denies fatigue, fever, weight loss,weakness, recent illness HEENT- denies eye drainage, change in vision, nasal discharge, CVS- denies chest pain, palpitations RESP- denies SOB, cough, wheeze ABD- denies N/V, change in stools, abd pain GU- denies dysuria, hematuria, dribbling, incontinence MSK-+ joint pain, muscle aches, injury Neuro- denies headache, dizziness, syncope, seizure activity       Objective:    BP 144/82  Pulse 68  Temp(Src) 97.8 F (36.6 C) (Oral)  Resp 12  Ht 5\' 7"  (1.702 m)  Wt 240 lb (108.863 kg)  BMI 37.58 kg/m2 GEN- NAD, alert and oriented x3  , no distress HEENT- PERRL, EOMI, non injected sclera, pink conjunctiva, MMM, oropharynx clear Neck- Supple, no JVD CVS- RRR,soft 2/6 SEM murmur RESP-CTAB EXT-  1+ pedal edema  Pulses- Radial, DP- equal bilat Neuro- left upper ext- hemiplegia       Assessment & Plan:      Problem List Items Addressed This Visit   Prediabetes   Relevant Orders      Hemoglobin A1c   Mixed hyperlipidemia - Primary   Relevant Orders      Lipid panel   Hypothyroidism   Relevant Orders      TSH      T3, free      T4, free   Essential hypertension, benign   Relevant Orders      CBC with Differential      Comprehensive metabolic panel      Note: This dictation was prepared with Dragon dictation along with smaller phrase technology.  Any transcriptional errors that result from this process are unintentional.

## 2013-08-09 NOTE — Assessment & Plan Note (Signed)
Lasix once a day for the next 3 days then as needed she does not tolerate her compression hose have advised her to get the support kneehighs then

## 2013-08-11 ENCOUNTER — Telehealth: Payer: Self-pay | Admitting: *Deleted

## 2013-08-11 NOTE — Telephone Encounter (Signed)
Submitted [employer] Referral by fax for authorization for cardiology to [facility] office, pending authorizaton

## 2013-08-15 ENCOUNTER — Other Ambulatory Visit: Payer: Self-pay | Admitting: *Deleted

## 2013-08-15 DIAGNOSIS — E039 Hypothyroidism, unspecified: Secondary | ICD-10-CM

## 2013-08-15 MED ORDER — LEVOTHYROXINE SODIUM 175 MCG PO TABS: 175.0000 ug | ORAL_TABLET | Freq: Every day | ORAL | Status: DC

## 2013-08-23 ENCOUNTER — Telehealth: Payer: Self-pay | Admitting: Family Medicine

## 2013-08-23 NOTE — Telephone Encounter (Signed)
Call placed to patient daughter. Noted line busy. Will attempt later.

## 2013-08-23 NOTE — Telephone Encounter (Signed)
Patients daughter [name] calling to speak with you regarding patients heart doctor please call her  [phone]

## 2013-08-23 NOTE — Telephone Encounter (Signed)
Call placed to patient. LMTRC.  

## 2013-08-24 ENCOUNTER — Other Ambulatory Visit: Payer: Self-pay | Admitting: Family Medicine

## 2013-08-24 NOTE — Telephone Encounter (Signed)
Call placed to [facility] at cardiologist.   Was informed that patient was seen in May 2015, and next appointment is not due until 11/2013.  Colletta [place] will cancel new patient appointment with Dr. Harl Bowie and will schedule F/U with [name] for 11/2013.  Patient daughter to be made aware by Colletta Maryland.

## 2013-08-24 NOTE — Telephone Encounter (Signed)
Call placed to patient daughter.   Reports that patient is seen by cardiologist in [place], [name].   Patient was told she has appointment in [place] with new cardiologist on Thursday in [place].   Please clarify.

## 2013-08-24 NOTE — Telephone Encounter (Signed)
Refill appropriate and filled per protocol. 

## 2013-08-25 ENCOUNTER — Ambulatory Visit: Payer: Medicare PPO | Admitting: Cardiology

## 2013-09-21 ENCOUNTER — Other Ambulatory Visit: Payer: Self-pay | Admitting: *Deleted

## 2013-09-21 DIAGNOSIS — E039 Hypothyroidism, unspecified: Secondary | ICD-10-CM

## 2013-09-30 ENCOUNTER — Other Ambulatory Visit: Payer: Medicare PPO

## 2013-09-30 DIAGNOSIS — E039 Hypothyroidism, unspecified: Secondary | ICD-10-CM

## 2013-10-01 LAB — T3, FREE: T3 FREE: 2.4 pg/mL (ref 2.3–4.2)

## 2013-10-01 LAB — T4, FREE: FREE T4: 0.83 ng/dL (ref 0.80–1.80)

## 2013-10-01 LAB — TSH: TSH: 4.664 u[IU]/mL — AB (ref 0.350–4.500)

## 2013-10-17 ENCOUNTER — Other Ambulatory Visit: Payer: Self-pay | Admitting: Family Medicine

## 2013-10-17 NOTE — Telephone Encounter (Signed)
Refill appropriate and filled per protocol. 

## 2013-10-23 ENCOUNTER — Other Ambulatory Visit: Payer: Self-pay | Admitting: Family Medicine

## 2013-10-25 NOTE — Telephone Encounter (Signed)
Medication refilled per protocol. 

## 2013-11-17 ENCOUNTER — Telehealth: Payer: Self-pay | Admitting: Family Medicine

## 2013-11-17 NOTE — Telephone Encounter (Signed)
Faxed [employer] referral for authorization to [facility] [facility], pending authorization for Dr. Rozann Lesches.

## 2013-11-17 NOTE — Telephone Encounter (Signed)
Pt is needing a referral because of her insurance to Dr Domenic Polite (pt daughter states that she believes we need to set the apt up) she needs it for Nov  [phone]

## 2013-12-05 ENCOUNTER — Telehealth: Payer: Self-pay | Admitting: Family Medicine

## 2013-12-05 NOTE — Telephone Encounter (Signed)
Patient is calling to check the status of her authorization to see her heart dr., who is dr Domenic Polite  Please call her back at [phone]

## 2013-12-06 NOTE — Telephone Encounter (Signed)
[place] [employer] to see if can get referral for pt to be seen with [name] and stated that a referral was not required and that can go see cardiologist. I call Daughter [name] back and she is aware and wil make appt for pt to be seen.

## 2013-12-21 ENCOUNTER — Telehealth: Payer: Self-pay | Admitting: Family Medicine

## 2013-12-21 ENCOUNTER — Other Ambulatory Visit: Payer: Self-pay | Admitting: Family Medicine

## 2013-12-21 MED ORDER — VALSARTAN-HYDROCHLOROTHIAZIDE 160-25 MG PO TABS: 1.0000 | ORAL_TABLET | Freq: Every day | ORAL | Status: DC

## 2013-12-21 MED ORDER — ROSUVASTATIN CALCIUM 40 MG PO TABS: ORAL_TABLET | ORAL | Status: DC

## 2013-12-21 NOTE — Telephone Encounter (Signed)
Refill appropriate and filled per protocol. 

## 2013-12-21 NOTE — Telephone Encounter (Signed)
Pt is needing a refill on rosuvastatin (CRESTOR) 40 MG tablet and valsartan-hydrochlorothiazide (DIOVAN-HCT) 160-25 MG per tablet  [id number] - [place], [place] - [place]

## 2013-12-23 ENCOUNTER — Other Ambulatory Visit: Payer: Self-pay | Admitting: Family Medicine

## 2013-12-23 NOTE — Telephone Encounter (Signed)
Medication refilled per protocol. 

## 2013-12-27 ENCOUNTER — Encounter: Payer: Self-pay | Admitting: Family Medicine

## 2013-12-27 ENCOUNTER — Ambulatory Visit (INDEPENDENT_AMBULATORY_CARE_PROVIDER_SITE_OTHER): Payer: Medicare PPO | Admitting: Family Medicine

## 2013-12-27 VITALS — BP 138/72 | HR 72 | Temp 98.2°F | Resp 16 | Ht 67.0 in | Wt 240.0 lb

## 2013-12-27 DIAGNOSIS — E669 Obesity, unspecified: Secondary | ICD-10-CM

## 2013-12-27 DIAGNOSIS — R7303 Prediabetes: Secondary | ICD-10-CM

## 2013-12-27 DIAGNOSIS — M79662 Pain in left lower leg: Secondary | ICD-10-CM

## 2013-12-27 DIAGNOSIS — R7309 Other abnormal glucose: Secondary | ICD-10-CM

## 2013-12-27 DIAGNOSIS — I10 Essential (primary) hypertension: Secondary | ICD-10-CM

## 2013-12-27 DIAGNOSIS — I8290 Acute embolism and thrombosis of unspecified vein: Secondary | ICD-10-CM

## 2013-12-27 DIAGNOSIS — E039 Hypothyroidism, unspecified: Secondary | ICD-10-CM

## 2013-12-27 DIAGNOSIS — E782 Mixed hyperlipidemia: Secondary | ICD-10-CM

## 2013-12-27 LAB — CBC WITH DIFFERENTIAL/PLATELET
Basophils Absolute: 0 10*3/uL (ref 0.0–0.1)
Basophils Relative: 0 % (ref 0–1)
EOS PCT: 4 % (ref 0–5)
Eosinophils Absolute: 0.3 10*3/uL (ref 0.0–0.7)
HCT: 35.4 % — ABNORMAL LOW (ref 36.0–46.0)
HEMOGLOBIN: 11.7 g/dL — AB (ref 12.0–15.0)
LYMPHS ABS: 1.4 10*3/uL (ref 0.7–4.0)
Lymphocytes Relative: 19 % (ref 12–46)
MCH: 25.2 pg — AB (ref 26.0–34.0)
MCHC: 33.1 g/dL (ref 30.0–36.0)
MCV: 76.1 fL — ABNORMAL LOW (ref 78.0–100.0)
MPV: 10.2 fL (ref 9.4–12.4)
Monocytes Absolute: 0.7 10*3/uL (ref 0.1–1.0)
Monocytes Relative: 9 % (ref 3–12)
Neutro Abs: 5 10*3/uL (ref 1.7–7.7)
Neutrophils Relative %: 68 % (ref 43–77)
Platelets: 176 10*3/uL (ref 150–400)
RBC: 4.65 MIL/uL (ref 3.87–5.11)
RDW: 17.9 % — ABNORMAL HIGH (ref 11.5–15.5)
WBC: 7.3 10*3/uL (ref 4.0–10.5)

## 2013-12-27 LAB — HEMOGLOBIN A1C
Hgb A1c MFr Bld: 6.5 % — ABNORMAL HIGH (ref <5.7)
MEAN PLASMA GLUCOSE: 140 mg/dL — AB (ref <117)

## 2013-12-27 LAB — LIPID PANEL
CHOLESTEROL: 179 mg/dL (ref 0–200)
HDL: 39 mg/dL — ABNORMAL LOW (ref >39)
LDL Cholesterol: 116 mg/dL — ABNORMAL HIGH (ref 0–99)
Total CHOL/HDL Ratio: 4.6 Ratio
Triglycerides: 122 mg/dL (ref <150)
VLDL: 24 mg/dL (ref 0–40)

## 2013-12-27 LAB — COMPREHENSIVE METABOLIC PANEL
ALT: 15 U/L (ref 0–35)
AST: 25 U/L (ref 0–37)
Albumin: 4.3 g/dL (ref 3.5–5.2)
Alkaline Phosphatase: 95 U/L (ref 39–117)
BUN: 15 mg/dL (ref 6–23)
CALCIUM: 9.9 mg/dL (ref 8.4–10.5)
CO2: 25 meq/L (ref 19–32)
CREATININE: 1.09 mg/dL (ref 0.50–1.10)
Chloride: 101 mEq/L (ref 96–112)
Glucose, Bld: 104 mg/dL — ABNORMAL HIGH (ref 70–99)
Potassium: 3.8 mEq/L (ref 3.5–5.3)
SODIUM: 137 meq/L (ref 135–145)
Total Bilirubin: 0.6 mg/dL (ref 0.2–1.2)
Total Protein: 8 g/dL (ref 6.0–8.3)

## 2013-12-27 LAB — T3, FREE: T3 FREE: 2.4 pg/mL (ref 2.3–4.2)

## 2013-12-27 LAB — TSH: TSH: 7.08 u[IU]/mL — AB (ref 0.350–4.500)

## 2013-12-27 LAB — T4, FREE: FREE T4: 0.81 ng/dL (ref 0.80–1.80)

## 2013-12-27 MED ORDER — HYDROCODONE-ACETAMINOPHEN 5-325 MG PO TABS: 1.0000 | ORAL_TABLET | Freq: Two times a day (BID) | ORAL | Status: DC | PRN

## 2013-12-27 NOTE — Assessment & Plan Note (Signed)
Recheck TFT , levels have improved with recent changes

## 2013-12-27 NOTE — Progress Notes (Signed)
Patient ID: Robin [name], female   DOB: 1941/01/10, 73 y.o.   MRN: [id number]    Subjective:    Patient ID: Robin [name], female    DOB: 1940-08-26, 73 y.o.   MRN: [id number]  Patient presents for F/U  is here to follow-up chronic medical problems. Robin [name] concern today is that she has some knots in a popped up on both legs that are tender to touch. She has not had any fever, no significant swelling in her legs. She does not remember any injury or if she [name] herself before the knot popped up. She does take her pain medication as needed Meds reviewed due for repeat labs today      Review Of Systems:  GEN- denies fatigue, fever, weight loss,weakness, recent illness HEENT- denies eye drainage, change in vision, nasal discharge, CVS- denies chest pain, palpitations RESP- denies SOB, cough, wheeze ABD- denies N/V, change in stools, abd pain GU- denies dysuria, hematuria, dribbling, incontinence MSK- + joint pain, muscle aches, injury Neuro- denies headache, dizziness, syncope, seizure activity       Objective:    BP 138/72 mmHg  Pulse 72  Temp(Src) 98.2 F (36.8 C) (Oral)  Resp 16  Ht 5\' 7"  (1.702 m)  Wt 240 lb (108.863 kg)  BMI 37.58 kg/m2 GEN- NAD, alert and oriented x3  , no distress HEENT- PERRL, EOMI, non injected sclera, pink conjunctiva, MMM, oropharynx clear Neck- Supple, no JVD CVS- RRR,soft 2/6 SEM murmur RESP-CTAB EXT-  trace pedal edema , small nodule palpated Right shin area TTP, no erythema, mild hyperpigementation to skin, small varicose vein left upper leg with nodule palpated, left calf 2 nodules palpated TTP  Pulses- Radial, DP- equal bilat Neuro- left upper ext- hemiplegia     Assessment & Plan:      Problem List Items Addressed This Visit    Prediabetes - Primary   Relevant Orders      Hemoglobin A1c   Obesity   Mixed hyperlipidemia   Relevant Orders      Lipid panel   Hypothyroidism    Recheck TFT , levels have improved with recent  changes    Relevant Orders      TSH      T3, free      T4, free   Essential hypertension, benign    BP looks great today, no changes to meds, no signficant edema on exam either     Relevant Orders      CBC with Differential      Comprehensive metabolic panel    Other Visit Diagnoses    Calf pain, left        obtain US, concern possible superficial thrombus based on location, she is on blood thinner, but they are painful and multilple palpated, other differntial erythema nodosum, less likley abscess or cellulitis    Relevant Orders       US Venous Img Lower Bilateral    Blood clot in vein        Relevant Orders       US Venous Img Lower Bilateral       Note: This dictation was prepared with Dragon dictation along with smaller phrase technology. Any transcriptional errors that result from this process are unintentional.

## 2013-12-27 NOTE — Patient Instructions (Signed)
We will call with labwork  Ultrasound to be done  F/U 4 months

## 2013-12-27 NOTE — Assessment & Plan Note (Signed)
BP looks great today, no changes to meds, no signficant edema on exam either

## 2013-12-28 ENCOUNTER — Telehealth: Payer: Self-pay | Admitting: *Deleted

## 2013-12-28 NOTE — Telephone Encounter (Signed)
Patient has appointment at [facility] on Dec 10 at 12pm, pt is aware.

## 2013-12-29 ENCOUNTER — Ambulatory Visit (HOSPITAL_COMMUNITY): Payer: Medicare PPO

## 2014-01-03 ENCOUNTER — Other Ambulatory Visit: Payer: Self-pay | Admitting: *Deleted

## 2014-01-03 DIAGNOSIS — E039 Hypothyroidism, unspecified: Secondary | ICD-10-CM

## 2014-01-05 ENCOUNTER — Ambulatory Visit (HOSPITAL_COMMUNITY)
Admission: RE | Admit: 2014-01-05 | Discharge: 2014-01-05 | Disposition: A | Discharge status: Home / Self Care | Payer: Medicare PPO | Source: Ambulatory Visit | Attending: Family Medicine | Admitting: Family Medicine

## 2014-01-05 DIAGNOSIS — I8290 Acute embolism and thrombosis of unspecified vein: Secondary | ICD-10-CM | POA: Diagnosis not present

## 2014-01-05 DIAGNOSIS — M79662 Pain in left lower leg: Secondary | ICD-10-CM | POA: Diagnosis present

## 2014-01-11 ENCOUNTER — Encounter: Payer: Self-pay | Admitting: Cardiology

## 2014-01-11 ENCOUNTER — Ambulatory Visit (INDEPENDENT_AMBULATORY_CARE_PROVIDER_SITE_OTHER): Payer: Medicare PPO | Admitting: Cardiology

## 2014-01-11 VITALS — BP 186/83 | HR 71 | Ht 66.0 in | Wt 242.0 lb

## 2014-01-11 DIAGNOSIS — I10 Essential (primary) hypertension: Secondary | ICD-10-CM

## 2014-01-11 DIAGNOSIS — I2510 Atherosclerotic heart disease of native coronary artery without angina pectoris: Secondary | ICD-10-CM

## 2014-01-11 DIAGNOSIS — I429 Cardiomyopathy, unspecified: Secondary | ICD-10-CM

## 2014-01-11 MED ORDER — NITROGLYCERIN 0.4 MG SL SUBL: 0.4000 mg | SUBLINGUAL_TABLET | SUBLINGUAL | Status: DC | PRN

## 2014-01-11 NOTE — Patient Instructions (Signed)

## 2014-01-11 NOTE — Progress Notes (Signed)
Reason for visit: CAD, cardiomyopathy  Clinical Summary Ms. Hunger is a 73 y.o.female last seen in May. She is here with her daughter today for a routine follow-up visit. She reports no chest pain symptoms, stable NYHA class II dyspnea with typical ADLs. She does not exercise. We reviewed her medications which remain stable.  Recent lab work showed hemoglobin 11.7, platelets 176, potassium 3.8, BUN 15, creatinine 1.0, normal LFTs, cholesterol 179, triglycerides 122, HDL 39, LDL 116, hemoglobin A1c 6.5. She continues to follow with Dr. Buelah Manis.  Followup echocardiogram in April 2013 showed moderate LVH with LVEF 76-73%, diastolic dysfunction, elevated filling pressures. Lexiscan Cardiolite from April 2013 demonstrated an area of inferolateral scar with mild peri-infarct ischemia, LVEF 61%. She has been managed medically.  No Known Allergies  Current Outpatient Prescriptions  Medication Sig Dispense Refill  . amLODipine (NORVASC) 5 MG tablet TAKE ONE TABLET BY MOUTH EVERY DAY FOR BLOOD PRESSURE  90 tablet 2  . aspirin 81 MG tablet Take 81 mg by mouth daily.      . carvedilol (COREG) 12.5 MG tablet TAKE 1 AND 1/2 TABLETS BY MOUTH TWICE A DAY WITH A MEAL 225 tablet 0  . clopidogrel (PLAVIX) 75 MG tablet TAKE ONE TABLET BY MOUTH ONE TIME DAILY  30 tablet 4  . CRESTOR 40 MG tablet TAKE ONE TABLET BY MOUTH ONE TIME DAILY  30 tablet 0  . docusate sodium (COLACE) 100 MG capsule Take 100 mg by mouth as needed for mild constipation.    Marland Kitchen HYDROcodone-acetaminophen (NORCO/VICODIN) 5-325 MG per tablet Take 1 tablet by mouth 2 (two) times daily as needed for moderate pain. 45 tablet 0  . nitroGLYCERIN (NITROSTAT) 0.4 MG SL tablet Place 1 tablet (0.4 mg total) under the tongue every 5 (five) minutes x 3 doses as needed. 25 tablet 3  . Omega-3 Fatty Acids 300 MG CAPS Take 1 capsule (300 mg total) by mouth 2 (two) times daily.    Marland Kitchen SYNTHROID 175 MCG tablet TAKE 1 TABLET BY MOUTH EVERY MORNING 31 tablet 3    . valsartan-hydrochlorothiazide (DIOVAN-HCT) 160-25 MG per tablet Take 1 tablet by mouth daily. 30 tablet 6   No current facility-administered medications for this visit.    Past Medical History  Diagnosis Date  . NSTEMI (non-ST elevated myocardial infarction)     2/12  . Cardiomyopathy     Probable Takotsubo, LVEF 30-35% 2/12  . HTN (hypertension)   . Hypothyroidism   . Hyperlipidemia   . Diabetes mellitus   . Stroke     (2000) residual left-sided weakness  . Carotid artery disease   . CAD (coronary artery disease)     DES second diagonal 2/12  . Breast cancer     Tamoxifen started 2007  . Hepatic steatosis   . Pancreas divisum     Past Surgical History  Procedure Laterality Date  . Carotid endarterectomy      Left  . Mastectomy      Bilateral  . Abdominal hysterectomy    . Cholecystectomy    . Joint replacement      Right knee replacement- [facility]. Sebastian [facility]    Social History Ms. Spagnolo reports that she has never smoked. She has never used smokeless tobacco. Ms. Fry reports that she does not drink alcohol.  Review of Systems Complete review of systems negative except as otherwise outlined in the clinical summary and also the following.  Physical Examination Filed Vitals:   01/11/14 1028  BP: 186/83  Pulse: 71   Filed Weights   01/11/14 1028  Weight: 242 lb (109.77 kg)    Obese woman in no acute distress.  HEENT: Conjunctiva and lids normal, oropharynx clear with moist mucosa.  Neck: Supple, no elevated JVP or carotid bruits, no thyromegaly.  Lungs: Clear to auscultation, nonlabored breathing at rest.  Cardiac: Regular rate and rhythm, no S3, 2/6 systolic murmur, no pericardial rub.  Abdomen: Soft, nontender, no hepatomegaly, bowel sounds present, no guarding or rebound.  Extremities: 1+ ankle edema bilaterally.    Problem List and Plan   Essential hypertension, benign Blood pressure significantly elevated today, however near  normal with her recent visit with Dr. Buelah Manis. No changes made to current regimen. Walking regimen for exercise would be helpful. Sodium restriction discussed as well.  Coronary atherosclerosis of native coronary artery No active angina symptoms. Continue medical therapy and observation.  Secondary cardiomyopathy History of probable Takotsubo cardiomyopathy with normalization of LVEF by last assessment.    Satira Sark, M.D., F.A.C.C.

## 2014-01-11 NOTE — Assessment & Plan Note (Signed)
History of probable Takotsubo cardiomyopathy with normalization of LVEF by last assessment.

## 2014-01-11 NOTE — Assessment & Plan Note (Signed)
Blood pressure significantly elevated today, however near normal with her recent visit with Dr. Buelah Manis. No changes made to current regimen. Walking regimen for exercise would be helpful. Sodium restriction discussed as well.

## 2014-01-11 NOTE — Assessment & Plan Note (Signed)
No active angina symptoms. Continue medical therapy and observation. 

## 2014-01-25 ENCOUNTER — Encounter: Payer: Self-pay | Admitting: Internal Medicine

## 2014-01-25 ENCOUNTER — Ambulatory Visit (INDEPENDENT_AMBULATORY_CARE_PROVIDER_SITE_OTHER): Payer: Medicare PPO | Admitting: Internal Medicine

## 2014-01-25 VITALS — BP 138/88 | HR 69 | Temp 98.1°F | Resp 12 | Ht 69.0 in | Wt 236.0 lb

## 2014-01-25 DIAGNOSIS — E039 Hypothyroidism, unspecified: Secondary | ICD-10-CM

## 2014-01-25 LAB — TSH: TSH: 13.29 u[IU]/mL — ABNORMAL HIGH (ref 0.35–4.50)

## 2014-01-25 LAB — T4, FREE: FREE T4: 0.83 ng/dL (ref 0.60–1.60)

## 2014-01-25 NOTE — Progress Notes (Signed)
Patient ID: Robin [name], female   DOB: November 10, 1940, 74 y.o.   MRN: [id number]   HPI  Robin [name] is a 74 y.o.-year-old female, referred by her PCP, Dr. Buelah Manis, for management of hypothyroidism. She is here with her daughter who offers part of the hx.  Pt. has been dx with hypothyroidism in ~2009; is on Synthroid DAW 175 mcg (changed in 09/2013), taken: - fasting - no missed doses - in am - with water - separated by >30 min from b'fast  - no calcium, iron, PPIs, multivitamins   I reviewed pt's thyroid tests: Lab Results  Component Value Date   TSH 7.080* 12/27/2013   TSH 4.664* 09/30/2013   TSH 8.700* 08/09/2013   TSH 6.573* 04/04/2013   TSH 6.649* 04/04/2013   TSH 0.688 07/19/2012   TSH 0.247* 05/03/2012   TSH 1.380 08/02/2011   TSH 4.706* 06/24/2011   TSH 5.545* 03/19/2010   FREET4 0.81 12/27/2013   FREET4 0.83 09/30/2013   FREET4 0.81 08/09/2013   FREET4 0.66* 04/04/2013   FREET4 1.17 07/19/2012   FREET4 1.15 08/02/2011    Pt describes: - no weight gain - no fatigue - + cold intolerance >> since started on Plavix. - no depression - no constipation - no dry skin - no hair loss  Pt denies feeling nodules in neck, hoarseness, dysphagia/odynophagia, SOB with lying down.  She has no FH of thyroid disorders. No FH of thyroid cancer.  No h/o radiation tx to head or neck. No recent use of iodine supplements. No herbal meds.  No h/o malabsorption. Had appendectomy and cholecystectomy. No celiac ds.   ROS: Constitutional: no weight gain/loss, no fatigue, no subjective hyperthermia/hypothermia, + nocturia Eyes: no blurry vision, no xerophthalmia ENT: no sore throat, no nodules palpated in throat, no dysphagia/odynophagia, + hoarseness, + tinnitus Cardiovascular: no CP/SOB/palpitations/+ leg swelling Respiratory: no cough/SOB Gastrointestinal: no N/V/D/C Musculoskeletal: + both: muscle/joint aches Skin: no rashes Neurological: no  tremors/numbness/tingling/dizziness, + HA Psychiatric: no depression/anxiety  Past Medical History  Diagnosis Date  . NSTEMI (non-ST elevated myocardial infarction)     2/12  . Cardiomyopathy     Probable Takotsubo, LVEF 30-35% 2/12  . HTN (hypertension)   . Hypothyroidism   . Hyperlipidemia   . Diabetes mellitus   . Stroke     (2000) residual left-sided weakness  . Carotid artery disease   . CAD (coronary artery disease)     DES second diagonal 2/12  . Breast cancer     Tamoxifen started 2007  . Hepatic steatosis   . Pancreas divisum    Past Surgical History  Procedure Laterality Date  . Carotid endarterectomy      Left  . Mastectomy      Bilateral  . Abdominal hysterectomy    . Cholecystectomy    . Joint replacement      Right [facility]- [facility]   History   Social History  . Marital Status: Married    Spouse Name: N/A    Number of Children: 6   Occupational History  . retired   Social History Main Topics  . Smoking status: Never Smoker   . Smokeless tobacco: Never Used  . Alcohol Use: No  . Drug Use: No   Current Outpatient Prescriptions on File Prior to Visit  Medication Sig Dispense Refill  . amLODipine (NORVASC) 5 MG tablet TAKE ONE TABLET BY MOUTH EVERY DAY FOR BLOOD PRESSURE  90 tablet 2  . aspirin 81 MG tablet  Take 81 mg by mouth daily.      . carvedilol (COREG) 12.5 MG tablet TAKE 1 AND 1/2 TABLETS BY MOUTH TWICE A DAY WITH A MEAL 225 tablet 0  . clopidogrel (PLAVIX) 75 MG tablet TAKE ONE TABLET BY MOUTH ONE TIME DAILY  30 tablet 4  . CRESTOR 40 MG tablet TAKE ONE TABLET BY MOUTH ONE TIME DAILY  30 tablet 0  . docusate sodium (COLACE) 100 MG capsule Take 100 mg by mouth as needed for mild constipation.    Marland Kitchen HYDROcodone-acetaminophen (NORCO/VICODIN) 5-325 MG per tablet Take 1 tablet by mouth 2 (two) times daily as needed for moderate pain. 45 tablet 0  . nitroGLYCERIN (NITROSTAT) 0.4 MG SL tablet Place 1 tablet (0.4 mg total)  under the tongue every 5 (five) minutes x 3 doses as needed. 25 tablet 3  . Omega-3 Fatty Acids 300 MG CAPS Take 1 capsule (300 mg total) by mouth 2 (two) times daily.    Marland Kitchen SYNTHROID 175 MCG tablet TAKE 1 TABLET BY MOUTH EVERY MORNING 31 tablet 3  . valsartan-hydrochlorothiazide (DIOVAN-HCT) 160-25 MG per tablet Take 1 tablet by mouth daily. 30 tablet 6   No current facility-administered medications on file prior to visit.   No Known Allergies Family History  Problem Relation Age of Onset  . Heart attack Sister   . Heart disease Sister   . Heart attack Brother   . Heart disease Brother    PE: BP 138/88 mmHg  Pulse 69  Temp(Src) 98.1 F (36.7 C) (Oral)  Resp 12  Ht 5\' 9"  (1.753 m)  Wt 257 lb (116.574 kg)  BMI 37.93 kg/m2  SpO2 97% Wt Readings from Last 3 Encounters:  01/25/14 236 lb (107.049 kg)  01/11/14 242 lb (109.77 kg)  12/27/13 240 lb (108.863 kg)   Constitutional: obese, in NAD Eyes: PERRLA, EOMI, no exophthalmos ENT: moist mucous membranes, no thyromegaly, no cervical lymphadenopathy Cardiovascular: RRR, No MRG Respiratory: CTA B Gastrointestinal: abdomen soft, NT, ND, BS+ Musculoskeletal: no deformities, strength decrease L arm and leg after her stroke in 2000 Skin: moist, warm, no rashes Neurological: no tremor with outstretched hands, DTR normal in all 4  ASSESSMENT: 1. Hypothyroidism  PLAN:  1. Patient with long-standing poorly controlled hypothyroidism, on levothyroxine therapy. She appears euthyroid. She does not appear to have a goiter, thyroid nodules, or neck compression symptoms - We discussed about correct intake of levothyroxine, fasting, with water, separated by at least 30 minutes from breakfast, and separated by more than 4 hours from calcium, iron, multivitamins, acid reflux medications (PPIs). She is taking it correctly. No h/o malabsorption, but I suspect a degree of it >> will need higher LT4 doses if TSH remains high after checked by our  assay - will check thyroid tests today: TSH, free T4 - If these are abnormal, she will need to return in 6 weeks for repeat labs - If these are normal, I will see her back in 4 months  Office Visit on 01/25/2014  Component Date Value Ref Range Status  . TSH 01/25/2014 13.29* 0.35 - 4.50 uIU/mL Final  . Free T4 01/25/2014 0.83  0.60 - 1.60 ng/dL Final   TSH high. I suspect some decree of noncompliance - ?forgets doses? Will increase the LT4 to 200 mcg daily. Recheck TFTs in 5-6 weeks.

## 2014-01-25 NOTE — Patient Instructions (Signed)
Pease stop at the lab. I will let you know if we need to increase the Synthroid dose when the results are back.  Take the thyroid hormone every day, with water, >30 minutes before breakfast, separated by >4 hours from acid reflux medications, calcium, iron, multivitamins. Please come back for a follow-up appointment in 4 months.

## 2014-01-26 MED ORDER — SYNTHROID 200 MCG PO TABS: 200.0000 ug | ORAL_TABLET | Freq: Every day | ORAL | Status: DC

## 2014-02-20 ENCOUNTER — Encounter: Payer: Self-pay | Admitting: Internal Medicine

## 2014-02-20 DIAGNOSIS — E039 Hypothyroidism, unspecified: Secondary | ICD-10-CM

## 2014-02-20 NOTE — Progress Notes (Signed)
Received labs from [facility] from 02/19/2014: - TSH 0.15 Will decrease the LT4 back to 175 mcg daily and recheck TFTs in 5 weeks.

## 2014-02-21 ENCOUNTER — Other Ambulatory Visit: Payer: Self-pay | Admitting: *Deleted

## 2014-02-21 MED ORDER — SYNTHROID 175 MCG PO TABS: 175.0000 ug | ORAL_TABLET | Freq: Every day | ORAL | Status: DC

## 2014-02-21 NOTE — Telephone Encounter (Signed)
Called pt and advised:  Received labs from [facility] from 02/19/2014: - TSH 0.15 Will decrease the LT4 back to 175 mcg daily and recheck TFTs in 5 weeks.

## 2014-03-07 ENCOUNTER — Ambulatory Visit: Payer: Commercial Managed Care - HMO | Admitting: Family Medicine

## 2014-03-10 ENCOUNTER — Telehealth: Payer: Self-pay | Admitting: Family Medicine

## 2014-03-10 NOTE — Telephone Encounter (Signed)
Call placed to patient.   States that she has some nasal congestion, nasal drainage with clear mucus, non productive cough, and slight chest congestion x3 days. Denies malaise, fatigue, or fever.   Advised to use OTC Mucinex or Robitussin for cough and nasal saline for congestion. Advised to increase rest and to increase fluid intake. Recommended that if symptoms worsen or persist >1 week to contact office for visit.

## 2014-03-10 NOTE — Telephone Encounter (Signed)
[phone]  PT is having bad cough and other symptoms and would like to have something called in  [place]

## 2014-03-14 ENCOUNTER — Encounter: Payer: Self-pay | Admitting: Family Medicine

## 2014-03-15 ENCOUNTER — Other Ambulatory Visit: Payer: Self-pay | Admitting: Family Medicine

## 2014-03-15 NOTE — Telephone Encounter (Signed)
Refill appropriate and filled per protocol. 

## 2014-03-16 ENCOUNTER — Telehealth: Payer: Self-pay | Admitting: Family Medicine

## 2014-03-16 MED ORDER — CLOPIDOGREL BISULFATE 75 MG PO TABS: 75.0000 mg | ORAL_TABLET | Freq: Every day | ORAL | Status: DC

## 2014-03-16 NOTE — Telephone Encounter (Signed)
Prescription sent to pharmacy.

## 2014-03-16 NOTE — Telephone Encounter (Signed)
[phone] [facility] [place]  Pt is needing a refill on clopidogrel (PLAVIX) 75 MG tablet

## 2014-04-07 ENCOUNTER — Encounter: Payer: Self-pay | Admitting: Family Medicine

## 2014-04-20 ENCOUNTER — Telehealth: Payer: Self-pay | Admitting: Internal Medicine

## 2014-04-20 MED ORDER — SYNTHROID 175 MCG PO TABS: 175.0000 ug | ORAL_TABLET | Freq: Every day | ORAL | Status: DC

## 2014-04-20 NOTE — Telephone Encounter (Signed)
Patient need a refill of synthroid 175 mg, today if please fax to  [id number] - [place], [place] [phone] (Phone) [phone] (Fax)

## 2014-04-20 NOTE — Telephone Encounter (Signed)
Refill sent to pt's requested pharmacy.

## 2014-05-05 ENCOUNTER — Ambulatory Visit (INDEPENDENT_AMBULATORY_CARE_PROVIDER_SITE_OTHER): Payer: Medicare PPO | Admitting: Family Medicine

## 2014-05-05 ENCOUNTER — Encounter: Payer: Self-pay | Admitting: Family Medicine

## 2014-05-05 VITALS — BP 140/88 | HR 76 | Temp 98.2°F | Resp 16 | Ht 69.0 in | Wt 230.0 lb

## 2014-05-05 DIAGNOSIS — E669 Obesity, unspecified: Secondary | ICD-10-CM | POA: Diagnosis not present

## 2014-05-05 DIAGNOSIS — R7303 Prediabetes: Secondary | ICD-10-CM

## 2014-05-05 DIAGNOSIS — E782 Mixed hyperlipidemia: Secondary | ICD-10-CM | POA: Diagnosis not present

## 2014-05-05 DIAGNOSIS — I10 Essential (primary) hypertension: Secondary | ICD-10-CM | POA: Diagnosis not present

## 2014-05-05 DIAGNOSIS — M1611 Unilateral primary osteoarthritis, right hip: Secondary | ICD-10-CM | POA: Diagnosis not present

## 2014-05-05 DIAGNOSIS — R7309 Other abnormal glucose: Secondary | ICD-10-CM

## 2014-05-05 DIAGNOSIS — E039 Hypothyroidism, unspecified: Secondary | ICD-10-CM

## 2014-05-05 DIAGNOSIS — I2510 Atherosclerotic heart disease of native coronary artery without angina pectoris: Secondary | ICD-10-CM

## 2014-05-05 LAB — COMPREHENSIVE METABOLIC PANEL
ALT: 16 U/L (ref 0–35)
AST: 18 U/L (ref 0–37)
Albumin: 3.9 g/dL (ref 3.5–5.2)
Alkaline Phosphatase: 120 U/L — ABNORMAL HIGH (ref 39–117)
BILIRUBIN TOTAL: 0.6 mg/dL (ref 0.2–1.2)
BUN: 15 mg/dL (ref 6–23)
CO2: 25 mEq/L (ref 19–32)
Calcium: 9.7 mg/dL (ref 8.4–10.5)
Chloride: 103 mEq/L (ref 96–112)
Creat: 0.9 mg/dL (ref 0.50–1.10)
Glucose, Bld: 103 mg/dL — ABNORMAL HIGH (ref 70–99)
Potassium: 3.9 mEq/L (ref 3.5–5.3)
Sodium: 138 mEq/L (ref 135–145)
TOTAL PROTEIN: 7.3 g/dL (ref 6.0–8.3)

## 2014-05-05 LAB — CBC WITH DIFFERENTIAL/PLATELET
BASOS ABS: 0 10*3/uL (ref 0.0–0.1)
Basophils Relative: 0 % (ref 0–1)
EOS ABS: 0.2 10*3/uL (ref 0.0–0.7)
Eosinophils Relative: 3 % (ref 0–5)
HCT: 35.6 % — ABNORMAL LOW (ref 36.0–46.0)
HEMOGLOBIN: 11.6 g/dL — AB (ref 12.0–15.0)
LYMPHS ABS: 1.6 10*3/uL (ref 0.7–4.0)
Lymphocytes Relative: 23 % (ref 12–46)
MCH: 24.3 pg — AB (ref 26.0–34.0)
MCHC: 32.6 g/dL (ref 30.0–36.0)
MCV: 74.6 fL — ABNORMAL LOW (ref 78.0–100.0)
MPV: 10.2 fL (ref 8.6–12.4)
Monocytes Absolute: 0.6 10*3/uL (ref 0.1–1.0)
Monocytes Relative: 8 % (ref 3–12)
NEUTROS PCT: 66 % (ref 43–77)
Neutro Abs: 4.7 10*3/uL (ref 1.7–7.7)
PLATELETS: 216 10*3/uL (ref 150–400)
RBC: 4.77 MIL/uL (ref 3.87–5.11)
RDW: 16 % — ABNORMAL HIGH (ref 11.5–15.5)
WBC: 7.1 10*3/uL (ref 4.0–10.5)

## 2014-05-05 LAB — LIPID PANEL
CHOL/HDL RATIO: 3.8 ratio
CHOLESTEROL: 145 mg/dL (ref 0–200)
HDL: 38 mg/dL — ABNORMAL LOW (ref >=46)
LDL CALC: 85 mg/dL (ref 0–99)
TRIGLYCERIDES: 110 mg/dL (ref <150)
VLDL: 22 mg/dL (ref 0–40)

## 2014-05-05 LAB — HEMOGLOBIN A1C
Hgb A1c MFr Bld: 6.2 % — ABNORMAL HIGH (ref <5.7)
Mean Plasma Glucose: 131 mg/dL — ABNORMAL HIGH (ref <117)

## 2014-05-05 MED ORDER — HYDROCODONE-ACETAMINOPHEN 5-325 MG PO TABS: 1.0000 | ORAL_TABLET | Freq: Two times a day (BID) | ORAL | Status: DC | PRN

## 2014-05-05 NOTE — Assessment & Plan Note (Signed)
She is currently on optimal medical treatment. Falls with cardiology no chest pain or shortness of breath recently

## 2014-05-05 NOTE — Progress Notes (Signed)
Patient ID: Robin [name], female   DOB: 02-Jan-1941, 74 y.o.   MRN: [id number]   Subjective:    Patient ID: Robin [name], female    DOB: [birth year], 74 y.o.   MRN: [id number]  Patient presents for 4 month F/U patient here to follow chronic medical problems. She has no particular concerns today. States that she feels well. She is actually lost 6 pounds since her last visit. She was seen by her cardiologist there were no significant changes. She is also being followed by endocrinology and they have been titrating her thyroid medication. She is due for fasting labs today. Her labs are all reviewed. She also requests a refill on her hydrocodone which she uses very sparingly for her arthritis.    Review Of Systems:  GEN- denies fatigue, fever, weight loss,weakness, recent illness HEENT- denies eye drainage, change in vision, nasal discharge, CVS- denies chest pain, palpitations RESP- denies SOB, cough, wheeze ABD- denies N/V, change in stools, abd pain GU- denies dysuria, hematuria, dribbling, incontinence MSK- + joint pain, muscle aches, injury Neuro- denies headache, dizziness, syncope, seizure activity       Objective:    BP 140/88 mmHg  Pulse 76  Temp(Src) 98.2 F (36.8 C) (Oral)  Resp 16  Ht 5\' 9"  (1.753 m)  Wt 230 lb (104.327 kg)  BMI 33.95 kg/m2 GEN- NAD, alert and oriented x3  , no distress HEENT- PERRL, EOMI, non injected sclera, pink conjunctiva, MMM, oropharynx clear Neck- Supple,  CVS- RRR,soft 2/6 SEM murmur RESP-CTAB EXT-  trace pedal edema ,  Pulses- Radial, DP- equal bilat Neuro- left upper ext- hemiplegia       Assessment & Plan:      Problem List Items Addressed This Visit    None      Note: This dictation was prepared with Dragon dictation along with smaller phrase technology. Any transcriptional errors that result from this process are unintentional.

## 2014-05-05 NOTE — Assessment & Plan Note (Signed)
Hydrocodone refilled.  

## 2014-05-05 NOTE — Assessment & Plan Note (Signed)
Will recheck A1c

## 2014-05-05 NOTE — Assessment & Plan Note (Signed)
Her blood pressure is improved today she has very labile blood pressure but with her age I'm not to make any changes

## 2014-05-05 NOTE — Assessment & Plan Note (Signed)
Recheck cholesterol panel, his has been well controlled

## 2014-05-05 NOTE — Patient Instructions (Signed)
We will call with lab results Continue current medications F/U 4 months

## 2014-05-08 ENCOUNTER — Encounter: Payer: Self-pay | Admitting: Family Medicine

## 2014-05-26 ENCOUNTER — Ambulatory Visit (INDEPENDENT_AMBULATORY_CARE_PROVIDER_SITE_OTHER): Payer: Medicare PPO | Admitting: Internal Medicine

## 2014-05-26 ENCOUNTER — Encounter: Payer: Self-pay | Admitting: Internal Medicine

## 2014-05-26 VITALS — BP 118/68 | HR 97 | Temp 97.9°F | Resp 14 | Wt 234.8 lb

## 2014-05-26 DIAGNOSIS — E039 Hypothyroidism, unspecified: Secondary | ICD-10-CM

## 2014-05-26 LAB — T4, FREE: FREE T4: 1.4 ng/dL (ref 0.60–1.60)

## 2014-05-26 LAB — TSH: TSH: 0.05 u[IU]/mL — AB (ref 0.35–4.50)

## 2014-05-26 NOTE — Progress Notes (Signed)
Patient ID: Robin [name], female   DOB: 1940-10-25, 74 y.o.   MRN: [id number]   HPI  Robin [name] a 74 y.o.-year-old female, returning for follow-up for hypothyroidism. Robin [name] [name] here with Robin [name] who offers part of the hx. Last visit 4 months ago.  Robin [name]. has been dx with hypothyroidism in ~2009; [name] on Synthroid DAW 175 mcg, taken: - fasting - no missed doses - in am - with water - separated by >30 min from b'fast  - no calcium, iron, PPIs, multivitamins   At last visit, since TSH was high, at 13, we increase Robin [name] Synthroid dose from 175 to 200 g.  Received labs from [facility] from 02/19/2014: - TSH 0.15 We decreased the LT4 back to 175 mcg daily and plans to recheck TFTs in 5 weeks. Robin [name] did not return for labs.  I reviewed Robin [name]'s thyroid tests: Lab Results  Component Value Date   TSH 13.29* 01/25/2014   TSH 7.080* 12/27/2013   TSH 4.664* 09/30/2013   TSH 8.700* 08/09/2013   TSH 6.573* 04/04/2013   TSH 6.649* 04/04/2013   TSH 0.688 07/19/2012   TSH 0.247* 05/03/2012   TSH 1.380 08/02/2011   TSH 4.706* 06/24/2011   FREET4 0.83 01/25/2014   FREET4 0.81 12/27/2013   FREET4 0.83 09/30/2013   FREET4 0.81 08/09/2013   FREET4 0.66* 04/04/2013   FREET4 1.17 07/19/2012   FREET4 1.15 08/02/2011    Robin [name] describes: - no weight gain - no fatigue - + cold intolerance >> since started on Plavix. - no depression - no constipation - no dry skin - no hair loss  Robin [name] denies feeling nodules in neck, hoarseness, dysphagia/odynophagia, SOB with lying down.  No h/o malabsorption. Had appendectomy and cholecystectomy. No celiac ds.   ROS: Constitutional: no weight gain/loss, no fatigue, no subjective hyperthermia/hypothermia Eyes: no blurry vision, no xerophthalmia ENT: no sore throat, no nodules palpated in throat, no dysphagia/odynophagia Cardiovascular: no CP/SOB/palpitations/+ leg swelling Respiratory: no cough/SOB Gastrointestinal: no N/V/D/C Musculoskeletal: no  muscle/joint aches Skin: no rashes Neurological: no tremors/numbness/tingling/dizziness, + HA  I reviewed Robin [name]'s medications, allergies, PMH, social hx, family hx, and changes were documented in the history of present illness. Otherwise, unchanged from my initial visit note:  Past Medical History  Diagnosis Date  . NSTEMI (non-ST elevated myocardial infarction)     2/12  . Cardiomyopathy     Probable Takotsubo, LVEF 30-35% 2/12  . HTN (hypertension)   . Hypothyroidism   . Hyperlipidemia   . Diabetes mellitus   . Stroke     (2000) residual left-sided weakness  . Carotid artery disease   . CAD (coronary artery disease)     DES second diagonal 2/12  . Breast cancer     Tamoxifen started 2007  . Hepatic steatosis   . Pancreas divisum    Past Surgical History  Procedure Laterality Date  . Carotid endarterectomy      Left  . Mastectomy      Bilateral  . Abdominal hysterectomy    . Cholecystectomy    . Joint replacement      Right [facility]- [facility]   History   Social History  . Marital Status: Married    Spouse Name: N/A    Number of Children: 6   Occupational History  . retired   Social History Main Topics  . Smoking status: Never Smoker   . Smokeless tobacco: Never Used  . Alcohol Use: No  . Drug Use:  No   Current Outpatient Prescriptions on File Prior to Visit  Medication Sig Dispense Refill  . amLODipine (NORVASC) 5 MG tablet TAKE ONE TABLET BY MOUTH EVERY DAY FOR BLOOD PRESSURE  90 tablet 1  . aspirin 81 MG tablet Take 81 mg by mouth daily.      . carvedilol (COREG) 12.5 MG tablet TAKE 1 AND 1/2 TABLETS BY MOUTH TWICE A DAY WITH A MEAL 225 tablet 0  . clopidogrel (PLAVIX) 75 MG tablet Take 1 tablet (75 mg total) by mouth daily. 30 tablet 4  . CRESTOR 40 MG tablet TAKE ONE TABLET BY MOUTH ONE TIME DAILY  30 tablet 0  . docusate sodium (COLACE) 100 MG capsule Take 100 mg by mouth as needed for mild constipation.    Marland Kitchen  HYDROcodone-acetaminophen (NORCO/VICODIN) 5-325 MG per tablet Take 1 tablet by mouth 2 (two) times daily as needed for moderate pain. 45 tablet 0  . nitroGLYCERIN (NITROSTAT) 0.4 MG SL tablet Place 1 tablet (0.4 mg total) under the tongue every 5 (five) minutes x 3 doses as needed. 25 tablet 3  . Omega-3 Fatty Acids 300 MG CAPS Take 1 capsule (300 mg total) by mouth 2 (two) times daily.    Marland Kitchen SYNTHROID 175 MCG tablet Take 1 tablet (175 mcg total) by mouth daily before breakfast. 30 tablet 2  . valsartan-hydrochlorothiazide (DIOVAN-HCT) 160-25 MG per tablet Take 1 tablet by mouth daily. 30 tablet 6   No current facility-administered medications on file prior to visit.   No Known Allergies Family History  Problem Relation Age of Onset  . Heart attack Sister   . Heart disease Sister   . Heart attack Brother   . Heart disease Brother    PE: BP 118/68 mmHg  Pulse 97  Temp(Src) 97.9 F (36.6 C) (Oral)  Resp 14  Wt 234 lb 12.8 oz (106.505 kg)  SpO2 94% Body mass index [name] 34.66 kg/(m^2). Repeat pulse at the ned of the appt >> 70s Wt Readings from Last 3 Encounters:  05/26/14 234 lb 12.8 oz (106.505 kg)  05/05/14 230 lb (104.327 kg)  01/25/14 236 lb (107.049 kg)   Constitutional: obese, in NAD Eyes: PERRLA, EOMI, no exophthalmos ENT: moist mucous membranes, no thyromegaly, no cervical lymphadenopathy Cardiovascular: RRR, No MRG Respiratory: CTA B Gastrointestinal: abdomen soft, NT, ND, BS+ Musculoskeletal: no deformities, strength decrease L arm and leg after Robin [name] stroke in 2000 Skin: moist, warm, no rashes Neurological: no tremor with outstretched hands, DTR normal in all 4  ASSESSMENT: 1. Hypothyroidism - poorly controlled  PLAN:  1. Patient with long-standing poorly controlled hypothyroidism, on levothyroxine therapy. Robin [name] appears euthyroid. Robin [name] does not appear to have a goiter, thyroid nodules, or neck compression symptoms - We discussed about correct intake of levothyroxine,  fasting, with water, separated by at least 30 minutes from breakfast, and separated by more than 4 hours from calcium, iron, multivitamins, acid reflux medications (PPIs). Robin [name] [name] taking it correctly.  - will check thyroid tests today: TSH, free T4 - continue LT4 175 mcg for now until results back >> advised them that we may need another TSH and fT4 in 6 weeks if we end up changing the dose - If labs are normal, I will see Robin [name] back in 4 months - Needs refill of LT4 when labs are back  Office Visit on 05/26/2014  Component Date Value Ref Range Status  . TSH 05/26/2014 0.05* 0.35 - 4.50 uIU/mL Final  . Free T4 05/26/2014 1.40  0.60 -  1.60 ng/dL Final   TSH [name] still suppressed, we'll need to decrease Robin [name] levothyroxine dose. Robin [name] [name] now taking 175 g. I will decrease to 150 g and have Robin [name] return for repeat labs in 5 weeks.

## 2014-05-26 NOTE — Patient Instructions (Signed)
Take the thyroid hormone every day, with water, >30 minutes before breakfast, separated by >4 hours from acid reflux medications, calcium, iron, multivitamins.  Please stop at the lab.  Please come back for a follow-up appointment in 4 months.

## 2014-05-29 MED ORDER — SYNTHROID 150 MCG PO TABS: 150.0000 ug | ORAL_TABLET | Freq: Every day | ORAL | Status: DC

## 2014-06-07 ENCOUNTER — Encounter: Payer: Self-pay | Admitting: Family Medicine

## 2014-06-23 ENCOUNTER — Telehealth: Payer: Self-pay | Admitting: Internal Medicine

## 2014-06-23 NOTE — Telephone Encounter (Signed)
Leron Croak from [facility] need to speak to you concerning patient FL[facility] Papers [phone]

## 2014-06-26 NOTE — Telephone Encounter (Signed)
Called and lvm with Robin [name], HR at [facility] in [place], [place].

## 2014-06-27 ENCOUNTER — Telehealth: Payer: Self-pay | Admitting: *Deleted

## 2014-06-27 NOTE — Telephone Encounter (Signed)
Would like clarification on paper work that was faxed to her

## 2014-07-04 ENCOUNTER — Encounter: Payer: Self-pay | Admitting: Cardiology

## 2014-07-04 ENCOUNTER — Ambulatory Visit (INDEPENDENT_AMBULATORY_CARE_PROVIDER_SITE_OTHER): Payer: Medicare PPO | Admitting: Cardiology

## 2014-07-04 VITALS — BP 160/82 | HR 80 | Ht 66.0 in | Wt 234.0 lb

## 2014-07-04 DIAGNOSIS — I10 Essential (primary) hypertension: Secondary | ICD-10-CM | POA: Diagnosis not present

## 2014-07-04 DIAGNOSIS — I2510 Atherosclerotic heart disease of native coronary artery without angina pectoris: Secondary | ICD-10-CM | POA: Diagnosis not present

## 2014-07-04 DIAGNOSIS — I429 Cardiomyopathy, unspecified: Secondary | ICD-10-CM | POA: Diagnosis not present

## 2014-07-04 DIAGNOSIS — E782 Mixed hyperlipidemia: Secondary | ICD-10-CM | POA: Diagnosis not present

## 2014-07-04 NOTE — Progress Notes (Signed)
Cardiology Office Note  Date: 07/04/2014   ID: Robin [name], DOB Dec 06, 1940, MRN [id number]  PCP: Vic Blackbird, MD  Primary Cardiologist: Rozann Lesches, MD   Chief Complaint  Patient presents with  . Coronary Artery Disease  . History of cardiomyopathy    History of Present Illness: Robin [name] is a 74 y.o. female last seen in December 2015. She is here today with her daughter. Reports no angina symptoms on current medical regimen. She has intermittent leg edema which is dependent. Reports restricting sodium in her diet. We also talked about elevating her feet when she can. 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  Follow-up ECG today shows sinus rhythm with nonspecific ST segment changes.  Most recent cardiac testing is detailed below.   Past Medical History  Diagnosis Date  . NSTEMI (non-ST elevated myocardial infarction)     2/12  . Cardiomyopathy     Probable Takotsubo, LVEF 30-35% 2/12  . HTN (hypertension)   . Hypothyroidism   . Hyperlipidemia   . Diabetes mellitus   . Stroke     (2000) residual left-sided weakness  . Carotid artery disease   . CAD (coronary artery disease)     DES second diagonal 2/12  . Breast cancer     Tamoxifen started 2007  . Hepatic steatosis   . Pancreas divisum     Current Outpatient Prescriptions  Medication Sig Dispense Refill  . amLODipine (NORVASC) 5 MG tablet TAKE ONE TABLET BY MOUTH EVERY DAY FOR BLOOD PRESSURE  90 tablet 1  . aspirin 81 MG tablet Take 81 mg by mouth daily.      . carvedilol (COREG) 12.5 MG tablet TAKE 1 AND 1/2 TABLETS BY MOUTH TWICE A DAY WITH A MEAL 225 tablet 0  . clopidogrel (PLAVIX) 75 MG tablet Take 1 tablet (75 mg total) by mouth daily. 30 tablet 4  . CRESTOR 40 MG tablet TAKE ONE TABLET BY MOUTH ONE TIME DAILY  30 tablet 0  . docusate sodium (COLACE) 100 MG capsule Take 100 mg by mouth as needed for mild constipation.    Marland Kitchen  HYDROcodone-acetaminophen (NORCO/VICODIN) 5-325 MG per tablet Take 1 tablet by mouth 2 (two) times daily as needed for moderate pain. 45 tablet 0  . nitroGLYCERIN (NITROSTAT) 0.4 MG SL tablet Place 1 tablet (0.4 mg total) under the tongue every 5 (five) minutes x 3 doses as needed. 25 tablet 3  . Omega-3 Fatty Acids 300 MG CAPS Take 1 capsule (300 mg total) by mouth 2 (two) times daily.    Marland Kitchen SYNTHROID 150 MCG tablet Take 1 tablet (150 mcg total) by mouth daily before breakfast. 45 tablet 1  . valsartan-hydrochlorothiazide (DIOVAN-HCT) 160-25 MG per tablet Take 1 tablet by mouth daily. 30 tablet 6   No current facility-administered medications for this visit.    Allergies:  Review of patient's allergies indicates no known allergies.   Social History: The patient  reports that she has never smoked. She has never used smokeless tobacco. She reports that she does not drink alcohol or use illicit drugs.   ROS:  Please see the history of present illness. Otherwise, complete review of systems is positive for none.  All other systems are reviewed and negative.   Physical Exam: VS:  BP 160/82 mmHg  Pulse 80  Ht 5\' 6"  (1.676 m)  Wt 234 lb (106.142 kg)  BMI 37.79 kg/m2  SpO2 97%, BMI Body mass index  is 37.79 kg/(m^2).  Wt Readings from Last 3 Encounters:  07/04/14 234 lb (106.142 kg)  05/26/14 234 lb 12.8 oz (106.505 kg)  05/05/14 230 lb (104.327 kg)     Obese woman in no acute distress.  HEENT: Conjunctiva and lids normal, oropharynx clear with moist mucosa.  Neck: Supple, no elevated JVP or carotid bruits, no thyromegaly.  Lungs: Clear to auscultation, nonlabored breathing at rest.  Cardiac: Regular rate and rhythm, no S3, 2/6 systolic murmur, no pericardial rub.  Abdomen: Soft, nontender, no hepatomegaly, bowel sounds present, no guarding or rebound.  Extremities: 1+ ankle edema bilaterally.    ECG: ECG is ordered today.   Recent Labwork: 05/05/2014: ALT 16; AST 18; BUN 15;  Creat 0.90; Hemoglobin 11.6*; Platelets 216; Potassium 3.9; Sodium 138 05/26/2014: TSH 0.05*     Component Value Date/Time   CHOL 145 05/05/2014 1040   TRIG 110 05/05/2014 1040   HDL 38* 05/05/2014 1040   CHOLHDL 3.8 05/05/2014 1040   VLDL 22 05/05/2014 1040   LDLCALC 85 05/05/2014 1040    Other Studies Reviewed Today:  1. Followup echocardiogram in April 2013 showed moderate LVH with LVEF 54-65%, diastolic dysfunction, elevated filling pressures.   2. Lexiscan Cardiolite from April 2013 demonstrated an area of inferolateral scar with mild peri-infarct ischemia, LVEF 61%.   Assessment and Plan:  1. CAD status post DES to the second diagonal in 2012. She reports no angina symptoms on medical therapy.  2. History of probable Takotsubo cardiomyopathy with normalization of LVEF. No active heart failure symptoms. Plan to continue medical therapy and observation.  3. Hyperlipidemia, on statin therapy. Recent LDL 85.  Current medicines were reviewed with the patient today.   Orders Placed This Encounter  Procedures  . EKG 12-Lead    Disposition: FU with me in 6 months.   Signed, Satira Sark, MD, [facility] 07/04/2014 11:10 AM    [facility] [place], [place], [place] [address] Phone: [phone]; Fax: [phone]

## 2014-07-04 NOTE — Patient Instructions (Signed)
Your physician recommends that you continue on your current medications as directed. Please refer to the Current Medication list given to you today. Your physician recommends that you schedule a follow-up appointment in: 6 months. You will receive a reminder letter in the mail in about 4 months reminding you to call and schedule your appointment. If you don't receive this letter, please contact our office. 

## 2014-09-01 ENCOUNTER — Telehealth: Payer: Self-pay | Admitting: Internal Medicine

## 2014-09-01 MED ORDER — SYNTHROID 150 MCG PO TABS: 150.0000 ug | ORAL_TABLET | Freq: Every day | ORAL | Status: DC

## 2014-09-01 NOTE — Telephone Encounter (Signed)
Done

## 2014-09-01 NOTE — Telephone Encounter (Signed)
Patient refill of SYNTHROID 150 MCG tablet send to  [facility] [place] [place] [facility] [place] [phone] (Phone) [phone] (Fax)

## 2014-09-08 ENCOUNTER — Ambulatory Visit: Payer: Medicare PPO | Admitting: Family Medicine

## 2014-09-26 ENCOUNTER — Encounter: Payer: Self-pay | Admitting: Internal Medicine

## 2014-09-26 ENCOUNTER — Ambulatory Visit (INDEPENDENT_AMBULATORY_CARE_PROVIDER_SITE_OTHER): Payer: Medicare PPO | Admitting: Internal Medicine

## 2014-09-26 VITALS — BP 118/78 | HR 78 | Temp 98.1°F | Resp 12 | Wt 234.0 lb

## 2014-09-26 DIAGNOSIS — E039 Hypothyroidism, unspecified: Secondary | ICD-10-CM

## 2014-09-26 LAB — TSH: TSH: 0.56 u[IU]/mL (ref 0.35–4.50)

## 2014-09-26 LAB — T4, FREE: Free T4: 1.6 ng/dL (ref 0.60–1.60)

## 2014-09-26 MED ORDER — SYNTHROID 150 MCG PO TABS: 150.0000 ug | ORAL_TABLET | Freq: Every day | ORAL | Status: DC

## 2014-09-26 NOTE — Progress Notes (Signed)
Patient ID: Robin [name], female   DOB: [birth year], 74 y.o.   MRN: [id number]   HPI  Robin [name] is a 74 y.o.-year-old female, returning for follow-up for hypothyroidism. She is here with her daughter who offers part of the hx. Last visit 4 months ago.  Pt. has been dx with hypothyroidism in ~2009; is on Synthroid DAW 150 mcg (decreased 05/2014), taken: - fasting - no missed doses - in am - with water - separated by >30 min from b'fast  - no calcium, iron, PPIs, multivitamins   I reviewed pt's thyroid tests: Lab Results  Component Value Date   TSH 0.05* 05/26/2014   TSH 13.29* 01/25/2014   TSH 7.080* 12/27/2013   TSH 4.664* 09/30/2013   TSH 8.700* 08/09/2013   TSH 6.573* 04/04/2013   TSH 6.649* 04/04/2013   TSH 0.688 07/19/2012   TSH 0.247* 05/03/2012   TSH 1.380 08/02/2011   FREET4 1.40 05/26/2014   FREET4 0.83 01/25/2014   FREET4 0.81 12/27/2013   FREET4 0.83 09/30/2013   FREET4 0.81 08/09/2013   FREET4 0.66* 04/04/2013   FREET4 1.17 07/19/2012   FREET4 1.15 08/02/2011  [facility] from 02/19/2014: - TSH 0.15  Pt describes: - no weight gain - no fatigue - + cold intolerance >> since started on Plavix. - no depression - no constipation - no dry skin - no hair loss  Pt denies feeling nodules in neck, hoarseness, dysphagia/odynophagia, SOB with lying down.  No h/o malabsorption. Had appendectomy and cholecystectomy. No celiac ds.   ROS: Constitutional: no weight gain/loss, no fatigue, no subjective hyperthermia/hypothermia Eyes: no blurry vision, no xerophthalmia ENT: no sore throat, no nodules palpated in throat, no dysphagia/odynophagia Cardiovascular: no CP/SOB/palpitations/+ leg swelling Respiratory: no cough/SOB Gastrointestinal: no N/V/D/C Musculoskeletal: no muscle/joint aches Skin: no rashes Neurological: no tremors/numbness/tingling/dizziness, no HA  I reviewed pt's medications, allergies, PMH, social hx, family hx, and changes were  documented in the history of present illness. Otherwise, unchanged from my initial visit note:  Past Medical History  Diagnosis Date  . NSTEMI (non-ST elevated myocardial infarction)     2/12  . Cardiomyopathy     Probable Takotsubo, LVEF 30-35% 2/12  . HTN (hypertension)   . Hypothyroidism   . Hyperlipidemia   . Diabetes mellitus   . Stroke     (2000) residual left-sided weakness  . Carotid artery disease   . CAD (coronary artery disease)     DES second diagonal 2/12  . Breast cancer     Tamoxifen started 2007  . Hepatic steatosis   . Pancreas divisum    Past Surgical History  Procedure Laterality Date  . Carotid endarterectomy      Left  . Mastectomy      Bilateral  . Abdominal hysterectomy    . Cholecystectomy    . Joint replacement      Right knee [facility]- [facility]. Sebastian [facility]   History   Social History  . Marital Status: Married    Spouse Name: N/A    Number of Children: 6   Occupational History  . retired   Social History Main Topics  . Smoking status: Never Smoker   . Smokeless tobacco: Never Used  . Alcohol Use: No  . Drug Use: No   Current Outpatient Prescriptions on File Prior to Visit  Medication Sig Dispense Refill  . amLODipine (NORVASC) 5 MG tablet TAKE ONE TABLET BY MOUTH EVERY DAY FOR BLOOD PRESSURE  90 tablet 1  . aspirin 81  MG tablet Take 81 mg by mouth daily.      . carvedilol (COREG) 12.5 MG tablet TAKE 1 AND 1/2 TABLETS BY MOUTH TWICE A DAY WITH A MEAL 225 tablet 0  . clopidogrel (PLAVIX) 75 MG tablet Take 1 tablet (75 mg total) by mouth daily. 30 tablet 4  . CRESTOR 40 MG tablet TAKE ONE TABLET BY MOUTH ONE TIME DAILY  30 tablet 0  . docusate sodium (COLACE) 100 MG capsule Take 100 mg by mouth as needed for mild constipation.    Marland Kitchen HYDROcodone-acetaminophen (NORCO/VICODIN) 5-325 MG per tablet Take 1 tablet by mouth 2 (two) times daily as needed for moderate pain. 45 tablet 0  . nitroGLYCERIN (NITROSTAT) 0.4 MG SL tablet Place 1  tablet (0.4 mg total) under the tongue every 5 (five) minutes x 3 doses as needed. 25 tablet 3  . Omega-3 Fatty Acids 300 MG CAPS Take 1 capsule (300 mg total) by mouth 2 (two) times daily.    Marland Kitchen SYNTHROID 150 MCG tablet Take 1 tablet (150 mcg total) by mouth daily before breakfast. 45 tablet 1  . valsartan-hydrochlorothiazide (DIOVAN-HCT) 160-25 MG per tablet Take 1 tablet by mouth daily. 30 tablet 6   No current facility-administered medications on file prior to visit.   No Known Allergies Family History  Problem Relation Age of Onset  . Heart attack Sister   . Heart disease Sister   . Heart attack Brother   . Heart disease Brother    PE: BP 118/78 mmHg  Pulse 78  Temp(Src) 98.1 F (36.7 C) (Oral)  Resp 12  Wt 234 lb (106.142 kg)  SpO2 97% Body mass index is 37.79 kg/(m^2).  Wt Readings from Last 3 Encounters:  09/26/14 234 lb (106.142 kg)  07/04/14 234 lb (106.142 kg)  05/26/14 234 lb 12.8 oz (106.505 kg)   Constitutional: obese, in NAD Eyes: PERRLA, EOMI, no exophthalmos ENT: moist mucous membranes, no thyromegaly, no cervical lymphadenopathy Cardiovascular: RRR, No MRG Respiratory: CTA B Gastrointestinal: abdomen soft, NT, ND, BS+ Musculoskeletal: no deformities, strength decrease L arm and leg after her stroke in 2000 Skin: moist, warm, no rashes Neurological: + tremor with outstretched L hand, DTR normal in all 4  ASSESSMENT: 1. Hypothyroidism - poorly controlled  PLAN:  1. Patient with long-standing poorly controlled hypothyroidism, on levothyroxine therapy. She appears euthyroid. She does not appear to have a goiter, thyroid nodules, or neck compression symptoms - We discussed about correct intake of levothyroxine, fasting, with water, separated by at least 30 minutes from breakfast, and separated by more than 4 hours from calcium, iron, multivitamins, acid reflux medications (PPIs). She is taking it correctly.  - will check thyroid tests today: TSH, free T4 -  continue LT4 150 mcg for now until results back >> advised them that we may need another TSH and fT4 in 6 weeks if we end up changing the dose - If labs are normal, I will see her back in 6 months  - Needs [facility] of LT[facility] when [facility] are [facility]  Office Visit on 09/26/2014  Component Date Value Ref Range Status  . TSH 09/26/2014 0.56  0.35 - 4.50 uIU/mL Final  . Free T4 09/26/2014 1.60  0.60 - 1.60 ng/dL Final   Labs normal >> will continue LT4 150 mcg daily

## 2014-09-26 NOTE — Patient Instructions (Signed)
Please stop at the lab.  Please come back for a follow-up appointment in 6 months.  

## 2014-10-20 ENCOUNTER — Ambulatory Visit: Payer: Medicare PPO | Admitting: Family Medicine

## 2014-10-31 ENCOUNTER — Ambulatory Visit (INDEPENDENT_AMBULATORY_CARE_PROVIDER_SITE_OTHER): Payer: Medicare PPO | Admitting: Family Medicine

## 2014-10-31 ENCOUNTER — Encounter: Payer: Self-pay | Admitting: Family Medicine

## 2014-10-31 VITALS — BP 122/74 | HR 68 | Temp 98.0°F | Resp 14 | Ht 66.0 in | Wt 235.0 lb

## 2014-10-31 DIAGNOSIS — Z23 Encounter for immunization: Secondary | ICD-10-CM

## 2014-10-31 DIAGNOSIS — M1611 Unilateral primary osteoarthritis, right hip: Secondary | ICD-10-CM

## 2014-10-31 DIAGNOSIS — R7309 Other abnormal glucose: Secondary | ICD-10-CM | POA: Diagnosis not present

## 2014-10-31 DIAGNOSIS — R7303 Prediabetes: Secondary | ICD-10-CM

## 2014-10-31 DIAGNOSIS — T300 Burn of unspecified body region, unspecified degree: Secondary | ICD-10-CM | POA: Diagnosis not present

## 2014-10-31 DIAGNOSIS — I10 Essential (primary) hypertension: Secondary | ICD-10-CM | POA: Diagnosis not present

## 2014-10-31 DIAGNOSIS — E669 Obesity, unspecified: Secondary | ICD-10-CM | POA: Diagnosis not present

## 2014-10-31 DIAGNOSIS — E782 Mixed hyperlipidemia: Secondary | ICD-10-CM

## 2014-10-31 LAB — CBC WITH DIFFERENTIAL/PLATELET
BASOS ABS: 0 10*3/uL (ref 0.0–0.1)
BASOS PCT: 0 % (ref 0–1)
EOS ABS: 0.3 10*3/uL (ref 0.0–0.7)
EOS PCT: 5 % (ref 0–5)
HCT: 37.6 % (ref 36.0–46.0)
Hemoglobin: 12.4 g/dL (ref 12.0–15.0)
LYMPHS ABS: 1.7 10*3/uL (ref 0.7–4.0)
Lymphocytes Relative: 24 % (ref 12–46)
MCH: 24.7 pg — AB (ref 26.0–34.0)
MCHC: 33 g/dL (ref 30.0–36.0)
MCV: 74.9 fL — AB (ref 78.0–100.0)
MPV: 10.4 fL (ref 8.6–12.4)
Monocytes Absolute: 0.5 10*3/uL (ref 0.1–1.0)
Monocytes Relative: 7 % (ref 3–12)
NEUTROS PCT: 64 % (ref 43–77)
Neutro Abs: 4.4 10*3/uL (ref 1.7–7.7)
PLATELETS: 194 10*3/uL (ref 150–400)
RBC: 5.02 MIL/uL (ref 3.87–5.11)
RDW: 16.2 % — AB (ref 11.5–15.5)
WBC: 6.9 10*3/uL (ref 4.0–10.5)

## 2014-10-31 LAB — LIPID PANEL
Cholesterol: 154 mg/dL (ref 125–200)
HDL: 42 mg/dL — AB (ref >=46)
LDL CALC: 94 mg/dL (ref <130)
TRIGLYCERIDES: 90 mg/dL (ref <150)
Total CHOL/HDL Ratio: 3.7 Ratio (ref <=5.0)
VLDL: 18 mg/dL (ref <30)

## 2014-10-31 LAB — COMPREHENSIVE METABOLIC PANEL
ALT: 12 U/L (ref 6–29)
AST: 18 U/L (ref 10–35)
Albumin: 4.1 g/dL (ref 3.6–5.1)
Alkaline Phosphatase: 142 U/L — ABNORMAL HIGH (ref 33–130)
BUN: 13 mg/dL (ref 7–25)
CHLORIDE: 103 mmol/L (ref 98–110)
CO2: 26 mmol/L (ref 20–31)
CREATININE: 0.91 mg/dL (ref 0.60–0.93)
Calcium: 9.6 mg/dL (ref 8.6–10.4)
GLUCOSE: 106 mg/dL — AB (ref 70–99)
POTASSIUM: 4 mmol/L (ref 3.5–5.3)
SODIUM: 137 mmol/L (ref 135–146)
Total Bilirubin: 0.6 mg/dL (ref 0.2–1.2)
Total Protein: 7.4 g/dL (ref 6.1–8.1)

## 2014-10-31 MED ORDER — HYDROCODONE-ACETAMINOPHEN 5-325 MG PO TABS: 1.0000 | ORAL_TABLET | Freq: Two times a day (BID) | ORAL | Status: DC | PRN

## 2014-10-31 NOTE — Progress Notes (Signed)
Patient ID: Robin [name], female   DOB: 1940-05-27, 74 y.o.   MRN: [id number]   Subjective:    Patient ID: Robin [name], female    DOB: 09-Aug-1940, 74 y.o.   MRN: [id number]  Patient presents for F/U  patient here to follow chronic medical problems. She's been doing quite well. Last week she went to sleep with her heating pad on her arm and woke up with a thermal burn she initially had a blister that is now popped. She's been using a burn cream as well as Neosporin and is starting to heal. She has not had any drainage no fever.   She is taking all of her medicines as prescribed A were reviewed.    due for flu shot.   She is still following with cardiology as well as endocrinology for her hypothyroidism.    Review Of Systems: per above   GEN- denies fatigue, fever, weight loss,weakness, recent illness HEENT- denies eye drainage, change in vision, nasal discharge, CVS- denies chest pain, palpitations RESP- denies SOB, cough, wheeze ABD- denies N/V, change in stools, abd pain GU- denies dysuria, hematuria, dribbling, incontinence MSK- + joint pain, muscle aches, injury Neuro- denies headache, dizziness, syncope, seizure activity       Objective:    BP 122/74 mmHg  Pulse 68  Temp(Src) 98 F (36.7 C) (Oral)  Resp 14  Ht 5\' 6"  (1.676 m)  Wt 235 lb (106.595 kg)  BMI 37.95 kg/m2 GEN- NAD, alert and oriented x3  , no distress HEENT- PERRL, EOMI, non injected sclera, pink conjunctiva, MMM, oropharynx clear Neck- Supple,  CVS- RRR,soft 2/6 SEM murmur RESP-CTAB Skin- Right upper arm- quarter size and dime size blistered area with mild erythema surrounding, NT, no induration  EXT-  trace pedal edema ,  Pulses- Radial, DP- equal bilat Neuro- left upper ext- hemiplegia     Assessment & Plan:      Problem List Items Addressed This Visit    Prediabetes     Glucose intolerance Will recheck A1c with her weight and other comorbidities she is high risk for development of  diabetes      Relevant Orders   Hemoglobin A1c   Obesity   Relevant Orders   CBC with Differential/Platelet   OA (osteoarthritis) of hip     Pain medication refilled      Relevant Medications   HYDROcodone-acetaminophen (NORCO/VICODIN) 5-325 MG tablet   Mixed hyperlipidemia - Primary   Relevant Orders   Lipid panel   Essential hypertension, benign     Controlled no change in medications      Relevant Orders   CBC with Differential/Platelet   Comprehensive metabolic panel    Other Visit Diagnoses    Thermal burn        - discussed use of heating pad, continue with topicals, healing well, no signs of infection    Need for prophylactic vaccination and inoculation against influenza        Relevant Orders    Flu Vaccine QUAD 36+ mos IM (Fluarix & Fluzone Quad PF (Completed)       Note: This dictation was prepared with Dragon dictation along with smaller Company secretary. Any transcriptional errors that result from this process are unintentional.

## 2014-10-31 NOTE — Assessment & Plan Note (Signed)
Glucose intolerance Will recheck A1c with her weight and other comorbidities she is high risk for development of diabetes

## 2014-10-31 NOTE — Assessment & Plan Note (Signed)
Pain medication refilled

## 2014-10-31 NOTE — Patient Instructions (Addendum)
Flu shot given We will call with lab results Call for any signs of infection from burn, continue the neosporin and burn cream  F/U 4 months

## 2014-10-31 NOTE — Assessment & Plan Note (Signed)
Controlled no change in medications

## 2014-11-01 LAB — HEMOGLOBIN A1C
Hgb A1c MFr Bld: 6.2 % — ABNORMAL HIGH (ref <5.7)
Mean Plasma Glucose: 131 mg/dL — ABNORMAL HIGH (ref <117)

## 2014-12-04 ENCOUNTER — Telehealth: Payer: Self-pay | Admitting: Internal Medicine

## 2014-12-04 MED ORDER — SYNTHROID 150 MCG PO TABS
150.0000 ug | ORAL_TABLET | Freq: Every day | ORAL | Status: DC
Start: 2014-12-04 — End: 2015-05-30

## 2014-12-04 NOTE — Telephone Encounter (Signed)
Pt needs refill [facility] [place]

## 2015-01-03 ENCOUNTER — Encounter: Payer: Self-pay | Admitting: Cardiology

## 2015-01-03 ENCOUNTER — Ambulatory Visit (INDEPENDENT_AMBULATORY_CARE_PROVIDER_SITE_OTHER): Payer: Medicare PPO | Admitting: Cardiology

## 2015-01-03 VITALS — BP 150/98 | HR 72 | Ht 65.0 in | Wt 230.0 lb

## 2015-01-03 DIAGNOSIS — I2510 Atherosclerotic heart disease of native coronary artery without angina pectoris: Secondary | ICD-10-CM

## 2015-01-03 DIAGNOSIS — I429 Cardiomyopathy, unspecified: Secondary | ICD-10-CM | POA: Diagnosis not present

## 2015-01-03 DIAGNOSIS — E782 Mixed hyperlipidemia: Secondary | ICD-10-CM | POA: Diagnosis not present

## 2015-01-03 MED ORDER — CARVEDILOL 12.5 MG PO TABS: 18.7500 mg | ORAL_TABLET | Freq: Two times a day (BID) | ORAL | Status: DC

## 2015-01-03 NOTE — Progress Notes (Signed)
Cardiology Office Note  Date: 01/03/2015   ID: Robin [name], DOB 14-Jun-1940, MRN [id number]  PCP: Vic Blackbird, MD  Primary Cardiologist: Rozann Lesches, MD   Chief Complaint  Patient presents with  . Coronary Artery Disease  . History of cardiomyopathy    History of Present Illness: [name] is a 74 y.o. female last seen in June. She is here with her daughter today for a routine follow-up visit. She does not report any angina or nitroglycerin use, has stable NYHA class II dyspnea. She states that she has been busy taking care of a one-year old great-grandchild.  We reviewed her medications, she continues on aspirin, Plavix, Coreg, Norvasc, Diovan HCT, and has nitroglycerin available.  We reviewed her cardiac studies from 2013. She continues to follow with Dr. Buelah Manis for primary care. Recent lab work from October showed LDL 94 at that time.  Past Medical History  Diagnosis Date  . NSTEMI (non-ST elevated myocardial infarction) ([place])     2/12  . Cardiomyopathy     Probable Takotsubo, LVEF 30-35% 2/12  . HTN (hypertension)   . Hypothyroidism   . Hyperlipidemia   . Diabetes mellitus   . Stroke ([place])     (2000) residual left-sided weakness  . Carotid artery disease ([place])   . CAD (coronary artery disease)     DES second diagonal 2/12  . Breast cancer ([place])     Tamoxifen started 2007  . Hepatic steatosis   . Pancreas divisum     Current Outpatient Prescriptions  Medication Sig Dispense Refill  . amLODipine (NORVASC) 5 MG tablet TAKE ONE TABLET BY MOUTH EVERY DAY FOR BLOOD PRESSURE  90 tablet 1  . aspirin 81 MG tablet Take 81 mg by mouth daily.      . carvedilol (COREG) 12.5 MG tablet TAKE 1 AND 1/2 TABLETS BY MOUTH TWICE A DAY WITH A MEAL 225 tablet 0  . clopidogrel (PLAVIX) 75 MG tablet Take 1 tablet (75 mg total) by mouth daily. 30 tablet 4  . CRESTOR 40 MG tablet TAKE ONE TABLET BY MOUTH ONE TIME DAILY  30 tablet 0  . docusate sodium (COLACE) 100 MG  capsule Take 100 mg by mouth as needed for mild constipation.    Marland Kitchen HYDROcodone-acetaminophen (NORCO/VICODIN) 5-325 MG tablet Take 1 tablet by mouth 2 (two) times daily as needed for moderate pain. 45 tablet 0  . nitroGLYCERIN (NITROSTAT) 0.4 MG SL tablet Place 1 tablet (0.4 mg total) under the tongue every 5 (five) minutes x 3 doses as needed. 25 tablet 3  . Omega-3 Fatty Acids 300 MG CAPS Take 1 capsule (300 mg total) by mouth 2 (two) times daily.    Marland Kitchen SYNTHROID 150 MCG tablet Take 1 tablet (150 mcg total) by mouth daily before breakfast. 90 tablet 1  . valsartan-hydrochlorothiazide (DIOVAN-HCT) 160-25 MG per tablet Take 1 tablet by mouth daily. 30 tablet 6   No current facility-administered medications for this visit.   Allergies:  Review of patient's allergies indicates no known allergies.   Social History: The patient  reports that she has never smoked. She has never used smokeless tobacco. She reports that she does not drink alcohol or use illicit drugs.   ROS:  Please see the history of present illness. Otherwise, complete review of systems is positive for arthritic pains.  All other systems are reviewed and negative.   Physical Exam: VS:  BP 150/98 mmHg  Pulse 72  Ht 5\' 5"  (1.651 m)  Wt 230 lb (104.327 kg)  BMI 38.27 kg/m2  SpO2 98%, BMI Body mass index is 38.27 kg/(m^2).  Wt Readings from Last 3 Encounters:  01/03/15 230 lb (104.327 kg)  10/31/14 235 lb (106.595 kg)  09/26/14 234 lb (106.142 kg)    Obese woman in no acute distress.  HEENT: Conjunctiva and lids normal, oropharynx clear with moist mucosa.  Neck: Supple, no elevated JVP or carotid bruits, no thyromegaly.  Lungs: Clear to auscultation, nonlabored breathing at rest.  Cardiac: Regular rate and rhythm, no S3, 2/6 systolic murmur, no pericardial rub.  Abdomen: Soft, nontender, no hepatomegaly, bowel sounds present, no guarding or rebound.  Extremities: 1+ ankle edema bilaterally.   ECG: Tracing from  07/04/2014 showed normal sinus rhythm.  Recent Labwork: 09/26/2014: TSH 0.56 10/31/2014: ALT 12; AST 18; BUN 13; Creat 0.91; Hemoglobin 12.4; Platelets 194; Potassium 4.0; Sodium 137     Component Value Date/Time   CHOL 154 10/31/2014 0930   TRIG 90 10/31/2014 0930   HDL 42* 10/31/2014 0930   CHOLHDL 3.7 10/31/2014 0930   VLDL 18 10/31/2014 0930   LDLCALC 94 10/31/2014 0930    Other Studies Reviewed Today:  1. Followup echocardiogram in April 2013 showed moderate LVH with LVEF 123456, diastolic dysfunction, elevated filling pressures.   2. Lexiscan Cardiolite from April 2013 demonstrated an area of inferolateral scar with mild peri-infarct ischemia, LVEF 61%.   Assessment and Plan:  1. CAD status post DES to the second diagonal branch in 2012. She does not report any angina symptoms on medical therapy. We will continue observation.  2. History of suspected Takotsubo cardiomyopathy with normalization of LVEF on medical therapy. No active heart failure symptoms at this time.  3. Hyperlipidemia, on Crestor. Most recent LDL 94 with total cholesterol 154.  Current medicines were reviewed with the patient today.  Disposition: FU with me in 6 months.   Signed, Satira Sark, MD, [facility] 01/03/2015 10:35 AM    [facility] [place] [facility] [place], [place], [place] [address] Phone: [phone]; Fax: [phone]

## 2015-01-03 NOTE — Patient Instructions (Addendum)
   Coreg refilled today - sent to [facility] [place]  Continue all other medications.   Your physician wants you to follow up in: 6 months.  You will receive a reminder letter in the mail one-two months in advance.  If you don't receive a letter, please call our office to schedule the follow up appointment

## 2015-02-01 ENCOUNTER — Other Ambulatory Visit: Payer: Self-pay | Admitting: *Deleted

## 2015-02-01 MED ORDER — VALSARTAN-HYDROCHLOROTHIAZIDE 160-25 MG PO TABS: 1.0000 | ORAL_TABLET | Freq: Every day | ORAL | Status: DC

## 2015-02-01 NOTE — Telephone Encounter (Signed)
Received fax requesting refill on Diovan HCTZ.  Refill appropriate and filled per protocol.

## 2015-03-05 ENCOUNTER — Encounter: Payer: Self-pay | Admitting: Family Medicine

## 2015-03-05 ENCOUNTER — Ambulatory Visit (INDEPENDENT_AMBULATORY_CARE_PROVIDER_SITE_OTHER): Payer: Medicare PPO | Admitting: Family Medicine

## 2015-03-05 VITALS — BP 142/82 | HR 72 | Temp 98.0°F | Resp 18 | Wt 228.0 lb

## 2015-03-05 DIAGNOSIS — E782 Mixed hyperlipidemia: Secondary | ICD-10-CM

## 2015-03-05 DIAGNOSIS — I2510 Atherosclerotic heart disease of native coronary artery without angina pectoris: Secondary | ICD-10-CM | POA: Diagnosis not present

## 2015-03-05 DIAGNOSIS — E669 Obesity, unspecified: Secondary | ICD-10-CM

## 2015-03-05 DIAGNOSIS — R7303 Prediabetes: Secondary | ICD-10-CM | POA: Diagnosis not present

## 2015-03-05 DIAGNOSIS — R7309 Other abnormal glucose: Secondary | ICD-10-CM | POA: Diagnosis not present

## 2015-03-05 DIAGNOSIS — I10 Essential (primary) hypertension: Secondary | ICD-10-CM | POA: Diagnosis not present

## 2015-03-05 LAB — COMPREHENSIVE METABOLIC PANEL
ALK PHOS: 123 U/L (ref 33–130)
ALT: 12 U/L (ref 6–29)
AST: 17 U/L (ref 10–35)
Albumin: 3.8 g/dL (ref 3.6–5.1)
BILIRUBIN TOTAL: 0.6 mg/dL (ref 0.2–1.2)
BUN: 16 mg/dL (ref 7–25)
CO2: 25 mmol/L (ref 20–31)
Calcium: 9.5 mg/dL (ref 8.6–10.4)
Chloride: 105 mmol/L (ref 98–110)
Creat: 0.92 mg/dL (ref 0.60–0.93)
GLUCOSE: 89 mg/dL (ref 70–99)
Potassium: 4.1 mmol/L (ref 3.5–5.3)
SODIUM: 140 mmol/L (ref 135–146)
Total Protein: 7.2 g/dL (ref 6.1–8.1)

## 2015-03-05 LAB — LIPID PANEL
Cholesterol: 169 mg/dL (ref 125–200)
HDL: 40 mg/dL — ABNORMAL LOW (ref >=46)
LDL CALC: 110 mg/dL (ref <130)
TRIGLYCERIDES: 97 mg/dL (ref <150)
Total CHOL/HDL Ratio: 4.2 Ratio (ref <=5.0)
VLDL: 19 mg/dL (ref <30)

## 2015-03-05 LAB — CBC WITH DIFFERENTIAL/PLATELET
BASOS ABS: 0 10*3/uL (ref 0.0–0.1)
Basophils Relative: 0 % (ref 0–1)
Eosinophils Absolute: 0.3 10*3/uL (ref 0.0–0.7)
Eosinophils Relative: 4 % (ref 0–5)
HEMATOCRIT: 38.7 % (ref 36.0–46.0)
HEMOGLOBIN: 12.6 g/dL (ref 12.0–15.0)
Lymphocytes Relative: 20 % (ref 12–46)
Lymphs Abs: 1.3 10*3/uL (ref 0.7–4.0)
MCH: 24.7 pg — ABNORMAL LOW (ref 26.0–34.0)
MCHC: 32.6 g/dL (ref 30.0–36.0)
MCV: 75.9 fL — ABNORMAL LOW (ref 78.0–100.0)
MPV: 10.8 fL (ref 8.6–12.4)
Monocytes Absolute: 0.5 10*3/uL (ref 0.1–1.0)
Monocytes Relative: 8 % (ref 3–12)
NEUTROS ABS: 4.5 10*3/uL (ref 1.7–7.7)
Neutrophils Relative %: 68 % (ref 43–77)
Platelets: 159 10*3/uL (ref 150–400)
RBC: 5.1 MIL/uL (ref 3.87–5.11)
RDW: 17 % — ABNORMAL HIGH (ref 11.5–15.5)
WBC: 6.6 10*3/uL (ref 4.0–10.5)

## 2015-03-05 LAB — HEMOGLOBIN A1C
Hgb A1c MFr Bld: 6.2 % — ABNORMAL HIGH (ref <5.7)
Mean Plasma Glucose: 131 mg/dL — ABNORMAL HIGH (ref <117)

## 2015-03-05 MED ORDER — HYDROCODONE-ACETAMINOPHEN 5-325 MG PO TABS: 1.0000 | ORAL_TABLET | Freq: Two times a day (BID) | ORAL | Status: DC | PRN

## 2015-03-05 NOTE — Patient Instructions (Addendum)
[place]- Cocoa Butter/Vitamin D or Mederma  Continue current medications F/U 4 months for Physical

## 2015-03-05 NOTE — Assessment & Plan Note (Signed)
Blood pressure looks okay with current medications and her age. Reviewed cardiology note no changes

## 2015-03-05 NOTE — Assessment & Plan Note (Signed)
She is asymptomatic and doing fairly well.

## 2015-03-05 NOTE — Assessment & Plan Note (Signed)
Recheck lipids: LDL less than 100

## 2015-03-05 NOTE — Assessment & Plan Note (Addendum)
encouraged patient to watch her carbohydrate intake in the sweets. She is borderline diabetic but the A1c is 6.2% she has lost some weight intentionally and recheck her A1c today.

## 2015-03-05 NOTE — Progress Notes (Signed)
Patient ID: Robin [name], female   DOB: 1940-05-19, 74 y.o.   MRN: [id number]    Subjective:    Patient ID: Robin [name], female    DOB: Oct 15, 1940, 75 y.o.   MRN: [id number]  Patient presents for Follow-up   Pt here to f/u chronic medical problems. Taking medications as prescribed. Has known CAD, followed by cardiology.  Borderline DM- last A1C 6.2% Lipids at goal, renal function looks good for age.  Reviewed last cardiology note    Review Of Systems:  GEN- denies fatigue, fever, weight loss,weakness, recent illness HEENT- denies eye drainage, change in vision, nasal discharge, CVS- denies chest pain, palpitations RESP- denies SOB, cough, wheeze ABD- denies N/V, change in stools, abd pain GU- denies dysuria, hematuria, dribbling, incontinence MSK- +joint pain, muscle aches, injury Neuro- denies headache, dizziness, syncope, seizure activity       Objective:    BP 142/82 mmHg  Pulse 72  Temp(Src) 98 F (36.7 C) (Oral)  Resp 18  Wt 228 lb (103.42 kg) GEN- NAD, alert and oriented x3  , no distress HEENT- PERRL, EOMI, non injected sclera, pink conjunctiva, MMM, oropharynx clear Neck- Supple,  CVS- RRR,soft 2/6 SEM murmur RESP-CTAB EXT-  trace pedal edema ,  Pulses- Radial, DP- equal bilat Neuro- left upper ext- hemiplegia             Assessment & Plan:      Problem List Items Addressed This Visit    Prediabetes   Obesity   Mixed hyperlipidemia   Essential hypertension, benign   Coronary atherosclerosis of native coronary artery - Primary      Note: This dictation was prepared with Dragon dictation along with smaller phrase technology. Any transcriptional errors that result from this process are unintentional.

## 2015-03-06 ENCOUNTER — Encounter: Payer: Self-pay | Admitting: *Deleted

## 2015-03-25 DIAGNOSIS — J019 Acute sinusitis, unspecified: Secondary | ICD-10-CM | POA: Diagnosis not present

## 2015-03-26 ENCOUNTER — Ambulatory Visit: Payer: Medicare PPO | Admitting: Internal Medicine

## 2015-03-27 ENCOUNTER — Ambulatory Visit (INDEPENDENT_AMBULATORY_CARE_PROVIDER_SITE_OTHER): Payer: Medicare PPO | Admitting: Family Medicine

## 2015-03-27 ENCOUNTER — Encounter: Payer: Self-pay | Admitting: Family Medicine

## 2015-03-27 ENCOUNTER — Ambulatory Visit (HOSPITAL_COMMUNITY)
Admission: RE | Admit: 2015-03-27 | Discharge: 2015-03-27 | Disposition: A | Discharge status: Home / Self Care | Payer: Medicare PPO | Source: Ambulatory Visit | Attending: Family Medicine | Admitting: Family Medicine

## 2015-03-27 VITALS — BP 142/78 | HR 80 | Temp 98.6°F | Resp 18 | Ht 65.0 in | Wt 215.0 lb

## 2015-03-27 DIAGNOSIS — R05 Cough: Secondary | ICD-10-CM | POA: Diagnosis not present

## 2015-03-27 DIAGNOSIS — J209 Acute bronchitis, unspecified: Secondary | ICD-10-CM

## 2015-03-27 MED ORDER — IPRATROPIUM-ALBUTEROL 0.5-2.5 (3) MG/3ML IN SOLN
3.0000 mL | Freq: Once | RESPIRATORY_TRACT | Status: AC
  Administered 2015-03-27: 3 mL via RESPIRATORY_TRACT

## 2015-03-27 MED ORDER — PREDNISONE 20 MG PO TABS: 40.0000 mg | ORAL_TABLET | Freq: Every day | ORAL | Status: DC

## 2015-03-27 MED ORDER — METHYLPREDNISOLONE ACETATE 40 MG/ML IJ SUSP
40.0000 mg | Freq: Once | INTRAMUSCULAR | Status: AC
  Administered 2015-03-27: 40 mg via INTRAMUSCULAR

## 2015-03-27 MED ORDER — ALBUTEROL SULFATE HFA 108 (90 BASE) MCG/ACT IN AERS: 2.0000 | INHALATION_SPRAY | RESPIRATORY_TRACT | Status: DC | PRN

## 2015-03-27 NOTE — Patient Instructions (Signed)
Get the Chest xray done at [facility] Use inhaler as prescribed Start steroids Tomorrow  F/U as previous

## 2015-03-27 NOTE — Progress Notes (Signed)
Patient ID: Robin [name], female   DOB: 1940-08-26, 75 y.o.   MRN: [id number]   Subjective:    Patient ID: Robin [name], female    DOB: [birth year], 75 y.o.   MRN: [id number]  Patient presents for Illness  patient here with cough wheezing mild shortness of breath for the past U days. She did go to an urgent care on Sunday they prescribed her Augmentin and Phenergan with codeine. She says this does not help with her wheezing and tightness. Chest x-ray was not done. She has not had any fever. She has a lot of mucus coming up. She does not have any significant sinus pressure or drainage. She's been taking all of her other medications as prescribed.    Review Of Systems:  GEN- denies fatigue, fever, weight loss,weakness, recent illness HEENT- denies eye drainage, change in vision, nasal discharge, CVS- denies chest pain, palpitations RESP- denies SOB, cough, wheeze ABD- denies N/V, change in stools, abd pain GU- denies dysuria, hematuria, dribbling, incontinence MSK- denies joint pain, muscle aches, injury Neuro- denies headache, dizziness, syncope, seizure activity       Objective:    BP 142/78 mmHg  Pulse 80  Temp(Src) 98.6 F (37 C) (Oral)  Resp 18  Ht 5\' 5"  (1.651 m)  Wt 215 lb (97.523 kg)  BMI 35.78 kg/m2  SpO2 97% GEN- NAD, alert and oriented x3 HEENT- PERRL, EOMI, non injected sclera, pink conjunctiva, MMM, oropharynx clear  TM clear bilat no effusion, nares clear  Neck- Supple, no LAD CVS- RRR, 2/6SEM  RESP-bilat bronchospasm, increased WOB with cough, no rales, mild rhonchi Pulses- Radial 2+  CXR- no PNA   S/P neb-improved WOB and wheeze, good air movement      Assessment & Plan:      Problem List Items Addressed This Visit    None    Visit Diagnoses    Acute bronchitis, unspecified organism    -  Primary    CXR neg, D/C antibiotics, given prednisone, Responded well to neb teratment, IM Depo Medrol, given albuterol inhaler    Relevant Medications    methylPREDNISolone acetate (DEPO-MEDROL) injection 40 mg (Completed)    ipratropium-albuterol (DUONEB) 0.5-2.5 (3) MG/3ML nebulizer solution 3 mL (Completed)    Other Relevant Orders    DG Chest 2 View (Completed)       Note: This dictation was prepared with Dragon dictation along with smaller phrase technology. Any transcriptional errors that result from this process are unintentional.

## 2015-03-28 ENCOUNTER — Encounter: Payer: Self-pay | Admitting: Family Medicine

## 2015-04-03 ENCOUNTER — Ambulatory Visit: Payer: Medicare PPO | Admitting: Internal Medicine

## 2015-04-18 ENCOUNTER — Other Ambulatory Visit: Payer: Self-pay | Admitting: Family Medicine

## 2015-04-18 NOTE — Telephone Encounter (Signed)
Refill appropriate and filled per protocol. 

## 2015-04-26 ENCOUNTER — Encounter (HOSPITAL_COMMUNITY): Payer: Self-pay

## 2015-04-26 ENCOUNTER — Emergency Department (HOSPITAL_COMMUNITY): Payer: Medicare PPO

## 2015-04-26 ENCOUNTER — Inpatient Hospital Stay (HOSPITAL_COMMUNITY)
Admission: EM | Admit: 2015-04-26 | Discharge: 2015-04-27 | DRG: 305 | Disposition: A | Discharge status: Home / Self Care | Payer: Medicare PPO | Attending: Internal Medicine | Admitting: Internal Medicine

## 2015-04-26 DIAGNOSIS — R51 Headache: Secondary | ICD-10-CM | POA: Diagnosis not present

## 2015-04-26 DIAGNOSIS — E119 Type 2 diabetes mellitus without complications: Secondary | ICD-10-CM | POA: Diagnosis present

## 2015-04-26 DIAGNOSIS — Z853 Personal history of malignant neoplasm of breast: Secondary | ICD-10-CM | POA: Diagnosis not present

## 2015-04-26 DIAGNOSIS — Z9013 Acquired absence of bilateral breasts and nipples: Secondary | ICD-10-CM | POA: Diagnosis not present

## 2015-04-26 DIAGNOSIS — I161 Hypertensive emergency: Secondary | ICD-10-CM | POA: Diagnosis present

## 2015-04-26 DIAGNOSIS — R42 Dizziness and giddiness: Secondary | ICD-10-CM | POA: Diagnosis not present

## 2015-04-26 DIAGNOSIS — I169 Hypertensive crisis, unspecified: Secondary | ICD-10-CM | POA: Diagnosis not present

## 2015-04-26 DIAGNOSIS — I160 Hypertensive urgency: Secondary | ICD-10-CM | POA: Diagnosis not present

## 2015-04-26 DIAGNOSIS — Z96651 Presence of right artificial knee joint: Secondary | ICD-10-CM | POA: Diagnosis present

## 2015-04-26 DIAGNOSIS — I252 Old myocardial infarction: Secondary | ICD-10-CM | POA: Diagnosis not present

## 2015-04-26 DIAGNOSIS — Z7982 Long term (current) use of aspirin: Secondary | ICD-10-CM

## 2015-04-26 DIAGNOSIS — Z8249 Family history of ischemic heart disease and other diseases of the circulatory system: Secondary | ICD-10-CM | POA: Diagnosis not present

## 2015-04-26 DIAGNOSIS — I2510 Atherosclerotic heart disease of native coronary artery without angina pectoris: Secondary | ICD-10-CM | POA: Diagnosis present

## 2015-04-26 DIAGNOSIS — Z7902 Long term (current) use of antithrombotics/antiplatelets: Secondary | ICD-10-CM

## 2015-04-26 DIAGNOSIS — Z8673 Personal history of transient ischemic attack (TIA), and cerebral infarction without residual deficits: Secondary | ICD-10-CM | POA: Diagnosis not present

## 2015-04-26 DIAGNOSIS — I429 Cardiomyopathy, unspecified: Secondary | ICD-10-CM | POA: Diagnosis not present

## 2015-04-26 DIAGNOSIS — I10 Essential (primary) hypertension: Secondary | ICD-10-CM | POA: Diagnosis present

## 2015-04-26 DIAGNOSIS — E782 Mixed hyperlipidemia: Secondary | ICD-10-CM | POA: Diagnosis not present

## 2015-04-26 DIAGNOSIS — E039 Hypothyroidism, unspecified: Secondary | ICD-10-CM | POA: Diagnosis present

## 2015-04-26 DIAGNOSIS — K760 Fatty (change of) liver, not elsewhere classified: Secondary | ICD-10-CM | POA: Diagnosis present

## 2015-04-26 DIAGNOSIS — R079 Chest pain, unspecified: Secondary | ICD-10-CM | POA: Diagnosis not present

## 2015-04-26 LAB — CBC WITH DIFFERENTIAL/PLATELET
BASOS ABS: 0 10*3/uL (ref 0.0–0.1)
BASOS PCT: 0 %
Eosinophils Absolute: 0.4 10*3/uL (ref 0.0–0.7)
Eosinophils Relative: 4 %
HEMATOCRIT: 41.1 % (ref 36.0–46.0)
HEMOGLOBIN: 13.8 g/dL (ref 12.0–15.0)
LYMPHS PCT: 17 %
Lymphs Abs: 1.5 10*3/uL (ref 0.7–4.0)
MCH: 25.6 pg — ABNORMAL LOW (ref 26.0–34.0)
MCHC: 33.6 g/dL (ref 30.0–36.0)
MCV: 76.1 fL — ABNORMAL LOW (ref 78.0–100.0)
MONOS PCT: 9 %
Monocytes Absolute: 0.8 10*3/uL (ref 0.1–1.0)
NEUTROS ABS: 6 10*3/uL (ref 1.7–7.7)
NEUTROS PCT: 70 %
Platelets: 165 10*3/uL (ref 150–400)
RBC: 5.4 MIL/uL — ABNORMAL HIGH (ref 3.87–5.11)
RDW: 16.6 % — ABNORMAL HIGH (ref 11.5–15.5)
WBC: 8.6 10*3/uL (ref 4.0–10.5)

## 2015-04-26 LAB — COMPREHENSIVE METABOLIC PANEL
ALK PHOS: 116 U/L (ref 38–126)
ALT: 19 U/L (ref 14–54)
ANION GAP: 11 (ref 5–15)
AST: 22 U/L (ref 15–41)
Albumin: 4.2 g/dL (ref 3.5–5.0)
BUN: 22 mg/dL — ABNORMAL HIGH (ref 6–20)
CALCIUM: 9.7 mg/dL (ref 8.9–10.3)
CO2: 24 mmol/L (ref 22–32)
Chloride: 103 mmol/L (ref 101–111)
Creatinine, Ser: 0.88 mg/dL (ref 0.44–1.00)
GFR calc non Af Amer: >60 mL/min (ref >60)
Glucose, Bld: 135 mg/dL — ABNORMAL HIGH (ref 65–99)
POTASSIUM: 3.9 mmol/L (ref 3.5–5.1)
SODIUM: 138 mmol/L (ref 135–145)
Total Bilirubin: 0.7 mg/dL (ref 0.3–1.2)
Total Protein: 8 g/dL (ref 6.5–8.1)

## 2015-04-26 LAB — MRSA PCR SCREENING: MRSA by PCR: NEGATIVE

## 2015-04-26 LAB — APTT: APTT: 33 s (ref 24–37)

## 2015-04-26 LAB — TSH: TSH: 0.032 u[IU]/mL — AB (ref 0.350–4.500)

## 2015-04-26 LAB — TROPONIN I: Troponin I: <0.03 ng/mL (ref <0.031)

## 2015-04-26 MED ORDER — ENOXAPARIN SODIUM 40 MG/0.4ML ~~LOC~~ SOLN
40.0000 mg | SUBCUTANEOUS | Status: DC
  Administered 2015-04-26: 40 mg via SUBCUTANEOUS
  Filled 2015-04-26: qty 0.4

## 2015-04-26 MED ORDER — SODIUM CHLORIDE 0.9% FLUSH: 3.0000 mL | Freq: Two times a day (BID) | INTRAVENOUS | Status: DC

## 2015-04-26 MED ORDER — LABETALOL HCL 5 MG/ML IV SOLN
20.0000 mg | Freq: Once | INTRAVENOUS | Status: AC
  Administered 2015-04-26: 10 mg via INTRAVENOUS
  Filled 2015-04-26: qty 4

## 2015-04-26 MED ORDER — LABETALOL HCL 5 MG/ML IV SOLN
INTRAVENOUS | Status: AC
  Filled 2015-04-26: qty 20

## 2015-04-26 MED ORDER — CLOPIDOGREL BISULFATE 75 MG PO TABS
75.0000 mg | ORAL_TABLET | Freq: Every day | ORAL | Status: DC
  Administered 2015-04-26 – 2015-04-27 (×2): 75 mg via ORAL
  Filled 2015-04-26 (×2): qty 1

## 2015-04-26 MED ORDER — SENNOSIDES-DOCUSATE SODIUM 8.6-50 MG PO TABS: 1.0000 | ORAL_TABLET | Freq: Every evening | ORAL | Status: DC | PRN

## 2015-04-26 MED ORDER — SODIUM CHLORIDE 0.9 % IV SOLN: 250.0000 mL | INTRAVENOUS | Status: DC | PRN

## 2015-04-26 MED ORDER — AMLODIPINE BESYLATE 5 MG PO TABS
10.0000 mg | ORAL_TABLET | Freq: Every day | ORAL | Status: DC
  Administered 2015-04-26 – 2015-04-27 (×2): 10 mg via ORAL
  Filled 2015-04-26 (×2): qty 2

## 2015-04-26 MED ORDER — LABETALOL HCL 5 MG/ML IV SOLN
0.5000 mg/min | INTRAVENOUS | Status: DC
  Administered 2015-04-26 (×2): 0.5 mg/min via INTRAVENOUS
  Filled 2015-04-26: qty 100

## 2015-04-26 MED ORDER — VALSARTAN-HYDROCHLOROTHIAZIDE 160-25 MG PO TABS: 1.0000 | ORAL_TABLET | Freq: Every day | ORAL | Status: DC

## 2015-04-26 MED ORDER — ACETAMINOPHEN 325 MG PO TABS
650.0000 mg | ORAL_TABLET | Freq: Four times a day (QID) | ORAL | Status: DC | PRN
Start: 2015-04-26 — End: 2015-04-27

## 2015-04-26 MED ORDER — HYDROCHLOROTHIAZIDE 25 MG PO TABS
25.0000 mg | ORAL_TABLET | Freq: Every day | ORAL | Status: DC
  Administered 2015-04-26 – 2015-04-27 (×2): 25 mg via ORAL
  Filled 2015-04-26 (×2): qty 1

## 2015-04-26 MED ORDER — SODIUM CHLORIDE 0.9% FLUSH: 3.0000 mL | INTRAVENOUS | Status: DC | PRN

## 2015-04-26 MED ORDER — ACETAMINOPHEN 650 MG RE SUPP
650.0000 mg | Freq: Four times a day (QID) | RECTAL | Status: DC | PRN
Start: 2015-04-26 — End: 2015-04-27

## 2015-04-26 MED ORDER — OMEGA-3-ACID ETHYL ESTERS 1 G PO CAPS
1.0000 | ORAL_CAPSULE | Freq: Every day | ORAL | Status: DC
  Administered 2015-04-27: 1 g via ORAL
  Filled 2015-04-26: qty 1

## 2015-04-26 MED ORDER — LEVOTHYROXINE SODIUM 75 MCG PO TABS
150.0000 ug | ORAL_TABLET | Freq: Every day | ORAL | Status: DC
  Administered 2015-04-27: 150 ug via ORAL
  Filled 2015-04-26: qty 2

## 2015-04-26 MED ORDER — IRBESARTAN 150 MG PO TABS
150.0000 mg | ORAL_TABLET | Freq: Every day | ORAL | Status: DC
  Administered 2015-04-26 – 2015-04-27 (×2): 150 mg via ORAL
  Filled 2015-04-26 (×2): qty 1

## 2015-04-26 MED ORDER — CARVEDILOL 3.125 MG PO TABS
18.7500 mg | ORAL_TABLET | Freq: Two times a day (BID) | ORAL | Status: DC
  Administered 2015-04-26 – 2015-04-27 (×3): 18.75 mg via ORAL
  Filled 2015-04-26 (×3): qty 2

## 2015-04-26 MED ORDER — ROSUVASTATIN CALCIUM 20 MG PO TABS
40.0000 mg | ORAL_TABLET | Freq: Every day | ORAL | Status: DC
  Administered 2015-04-26 – 2015-04-27 (×2): 40 mg via ORAL
  Filled 2015-04-26 (×2): qty 2

## 2015-04-26 MED ORDER — NITROGLYCERIN 0.4 MG SL SUBL: 0.4000 mg | SUBLINGUAL_TABLET | SUBLINGUAL | Status: DC | PRN

## 2015-04-26 MED ORDER — LABETALOL HCL 5 MG/ML IV SOLN
INTRAVENOUS | Status: AC
  Administered 2015-04-26: 10 mg
  Filled 2015-04-26: qty 4

## 2015-04-26 MED ORDER — ASPIRIN 81 MG PO CHEW
81.0000 mg | CHEWABLE_TABLET | Freq: Every day | ORAL | Status: DC
  Administered 2015-04-26 – 2015-04-27 (×2): 81 mg via ORAL
  Filled 2015-04-26 (×2): qty 1

## 2015-04-26 MED ORDER — SODIUM CHLORIDE 0.9% FLUSH
3.0000 mL | Freq: Two times a day (BID) | INTRAVENOUS | Status: DC
Start: 2015-04-26 — End: 2015-04-27
  Administered 2015-04-26 – 2015-04-27 (×2): 3 mL via INTRAVENOUS

## 2015-04-26 NOTE — ED Notes (Signed)
Pt states since last night approx 2030 she has had dizziness with movement, worse with lying down.   Pt denies pain but states she feels nauseated

## 2015-04-26 NOTE — ED Provider Notes (Signed)
CSN: [id number]     Arrival date & time 04/26/15  0305 History   First MD Initiated Contact with Patient 04/26/15 0335    Chief Complaint  Patient presents with  . Dizziness     (Consider location/radiation/quality/duration/timing/severity/associated sxs/prior Treatment) HPI patient states she felt fine all day. She states about 8:30 PM she had sat down after dinner to watch TV and she started feeling dizzy. She states the dizziness as a feeling of spinning. She states when she stands up it gets worse however after she is stood up for a while it gets better. She also complains of frontal headache. She denies chest pain, shortness of breath, visual changes, numbness or tingling of her extremities, difficulty walking b/o leg walking.  She had what she thought was crestor once a day for the past two days, but it was her plavix so she was taking extra plavix the past 2 days. (the new bottle had plavix labeled with the generic label and she thought it was her crestor).    PCP Dr Buelah Manis  Past Medical History  Diagnosis Date  . NSTEMI (non-ST elevated myocardial infarction) ([place])     2/12  . Cardiomyopathy     Probable Takotsubo, LVEF 30-35% 2/12  . HTN (hypertension)   . Hypothyroidism   . Hyperlipidemia   . Diabetes mellitus   . 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 Carotid artery disease ([place])   . CAD (coronary artery disease)     DES second diagonal 2/12  . Breast cancer ([place])     Tamoxifen started 2007  . Hepatic steatosis   . Pancreas divisum    Past Surgical History  Procedure Laterality Date  . Carotid endarterectomy      Left  . Mastectomy      Bilateral  . Abdominal hysterectomy    . Cholecystectomy    . Joint replacement      Right [facility]- [facility]   Family History  Problem Relation Age of Onset  . Heart attack Sister   . Heart disease Sister   . Heart attack Brother   . Heart disease Brother    Social History   Substance Use Topics  . Smoking status: Never Smoker   . Smokeless tobacco: Never Used  . Alcohol Use: No   Lives with grand daughter   OB History    No data available     Review of Systems  All other systems reviewed and are negative.     Allergies  Codeine  Home Medications   Prior to Admission medications   Medication Sig Start Date End Date Taking? Authorizing Provider  albuterol (PROVENTIL HFA;VENTOLIN HFA) 108 (90 Base) MCG/ACT inhaler Inhale 2 puffs into the lungs every 4 (four) hours as needed for wheezing or shortness of breath. 03/27/15  Yes Alycia Rossetti, MD  amLODipine (NORVASC) 5 MG tablet TAKE ONE TABLET BY MOUTH EVERY DAY FOR BLOOD PRESSURE  03/15/14  Yes Alycia Rossetti, MD  aspirin 81 MG tablet Take 81 mg by mouth daily.     Yes Historical Provider, MD  carvedilol (COREG) 12.5 MG tablet Take 1.5 tablets (18.75 mg total) by mouth 2 (two) times daily. 01/03/15  Yes Satira Sark, MD  clopidogrel (PLAVIX) 75 MG tablet TAKE ONE TABLET BY MOUTH ONCE DAILY 04/18/15  Yes Alycia Rossetti, MD  CRESTOR 40 MG tablet TAKE ONE TABLET BY MOUTH ONCE DAILY 04/18/15  Yes Modena Nunnery  [place], MD  HYDROcodone-acetaminophen (NORCO/VICODIN) 5-325 MG tablet Take 1 tablet by mouth 2 (two) times daily as needed for moderate pain. 03/05/15  Yes Alycia Rossetti, MD  nitroGLYCERIN (NITROSTAT) 0.4 MG SL tablet Place 1 tablet (0.4 mg total) under the tongue every 5 (five) minutes x 3 doses as needed. 01/11/14  Yes Satira Sark, MD  Omega-3 Fatty Acids 300 MG CAPS Take 1 capsule (300 mg total) by mouth 2 (two) times daily. 04/24/11  Yes Donney Dice, PA-C  SYNTHROID 150 MCG tablet Take 1 tablet (150 mcg total) by mouth daily before breakfast. 12/04/14  Yes Philemon Kingdom, MD  valsartan-hydrochlorothiazide (DIOVAN-HCT) 160-25 MG tablet Take 1 tablet by mouth daily. 02/01/15  Yes Alycia Rossetti, MD  amoxicillin-clavulanate (AUGMENTIN) 875-125 MG tablet  03/25/15   Historical Provider,  MD  docusate sodium (COLACE) 100 MG capsule Take 100 mg by mouth as needed for mild constipation.    Historical Provider, MD  predniSONE (DELTASONE) 20 MG tablet Take 2 tablets (40 mg total) by mouth daily with breakfast. For 5 days 03/27/15   Alycia Rossetti, MD  promethazine-codeine [facility] WITH CODEINE) 6.25-10 MG/5ML syrup  03/25/15   Historical Provider, MD    ED Triage Vitals  Enc Vitals Group     BP 04/26/15 0330 250/125 mmHg     Pulse Rate 04/26/15 0330 80     Resp 04/26/15 0330 18     Temp 04/26/15 0330 98.3 F (36.8 C)     Temp Source 04/26/15 0330 Oral     SpO2 04/26/15 0330 99 %     Weight --      Height --      Head Cir --      Peak Flow --      Pain Score 04/26/15 0315 10     Pain Loc --      Pain Edu? --      Excl. in [place]? --    Vital signs normal except for hypertension    Physical Exam  Constitutional: She is oriented to person, place, and time. She appears well-developed and well-nourished.  Non-toxic appearance. She does not appear ill. No distress.  HENT:  Head: Normocephalic and atraumatic.  Right Ear: External ear normal.  Left Ear: External ear normal.  Nose: Nose normal. No mucosal edema or rhinorrhea.  Mouth/Throat: Oropharynx is clear and moist and mucous membranes are normal. No dental abscesses or uvula swelling.  Eyes: Conjunctivae and EOM are normal. Pupils are equal, round, and reactive to light.  Neck: Normal range of motion and full passive range of motion without pain. Neck supple.  Cardiovascular: Normal rate, regular rhythm and normal heart sounds.  Exam reveals no gallop and no friction rub.   No murmur heard. Pulmonary/Chest: Effort normal and breath sounds normal. No respiratory distress. She has no wheezes. She has no rhonchi. She has no rales. She exhibits no tenderness and no crepitus.  Abdominal: Soft. Normal appearance and bowel sounds are normal. She exhibits no distension. There is no tenderness. There is no rebound and no  guarding.  Musculoskeletal: Normal range of motion. She exhibits no edema or tenderness.  Moves all extremities well.   Neurological: She is alert and oriented to person, place, and time. She has normal strength. No cranial nerve deficit.  Skin: Skin is warm, dry and intact. No rash noted. No erythema. No pallor.  Psychiatric: She has a normal mood and affect. Her speech is normal and behavior is normal.  Her mood appears not anxious.  Nursing note and vitals reviewed.   ED Course  Procedures (including critical care time)  Medications  labetalol (NORMODYNE,TRANDATE) 500 mg in dextrose 5 % 125 mL (4 mg/mL) infusion (1 mg/min Intravenous Rate/Dose Change 04/26/15 0627)  labetalol (NORMODYNE,TRANDATE) injection 20 mg (10 mg Intravenous Given 04/26/15 0430)  labetalol (NORMODYNE,TRANDATE) 5 MG/ML injection (10 mg  Given 04/26/15 0440)   CT of the head was ordered for several reasons, to look for an acute stroke, and also don't make sure she does not have an acute intracranial bleed since she has been taking extra Plavix for the last couple of days.  Patient was given a bolus of labetalol and started on a labetalol drip for her extreme hypertension. As her blood pressure improved her dizziness and vertigo improved.  We discussed her test results, she states she had a stroke back in 2000. We also discussed admission because of her hypertension and possibility of an acute stroke although not seen on CT scan and patient is agreeable for admission. We discussed need for MRI to look an posterior fossa infarct. Order placed but they will not be here until around 8 am.   06:05 Dr Loleta Books, here in ED, will admit   06:55 Dr Loleta Books, states to admit to tele, but due to her being on the labetalol drip that is being titrated she was put in ICU.     Labs Review Results for orders placed or performed during the hospital encounter of 04/26/15  Comprehensive metabolic panel  Result Value Ref Range   Sodium 138  135 - 145 mmol/L   Potassium 3.9 3.5 - 5.1 mmol/L   Chloride 103 101 - 111 mmol/L   CO2 24 22 - 32 mmol/L   Glucose, Bld 135 (H) 65 - 99 mg/dL   BUN 22 (H) 6 - 20 mg/dL   Creatinine, Ser 0.88 0.44 - 1.00 mg/dL   Calcium 9.7 8.9 - 10.3 mg/dL   Total Protein 8.0 6.5 - 8.1 g/dL   Albumin 4.2 3.5 - 5.0 g/dL   AST 22 15 - 41 U/L   ALT 19 14 - 54 U/L   Alkaline Phosphatase 116 38 - 126 U/L   Total Bilirubin 0.7 0.3 - 1.2 mg/dL   GFR calc non Af Amer >60 >60 mL/min   GFR calc Af Amer >60 >60 mL/min   Anion gap 11 5 - 15  CBC with Differential  Result Value Ref Range   WBC 8.6 4.0 - 10.5 K/uL   RBC 5.40 (H) 3.87 - 5.11 MIL/uL   Hemoglobin 13.8 12.0 - 15.0 g/dL   HCT 41.1 36.0 - 46.0 %   MCV 76.1 (L) 78.0 - 100.0 fL   MCH 25.6 (L) 26.0 - 34.0 pg   MCHC 33.6 30.0 - 36.0 g/dL   RDW 16.6 (H) 11.5 - 15.5 %   Platelets 165 150 - 400 K/uL   Neutrophils Relative % 70 %   Neutro Abs 6.0 1.7 - 7.7 K/uL   Lymphocytes Relative 17 %   Lymphs Abs 1.5 0.7 - 4.0 K/uL   Monocytes Relative 9 %   Monocytes Absolute 0.8 0.1 - 1.0 K/uL   Eosinophils Relative 4 %   Eosinophils Absolute 0.4 0.0 - 0.7 K/uL   Basophils Relative 0 %   Basophils Absolute 0.0 0.0 - 0.1 K/uL  Troponin I  Result Value Ref Range   Troponin I <0.03 <0.031 ng/mL  APTT  Result Value Ref Range  aPTT 33 24 - 37 seconds    Laboratory interpretation all normal      Imaging Review Ct Head Wo Contrast  04/26/2015  CLINICAL DATA:  Headache, elevated blood pressure.  Dizziness. EXAM: CT HEAD WITHOUT CONTRAST TECHNIQUE: Contiguous axial images were obtained from the base of the skull through the vertex without intravenous contrast. COMPARISON:  Head CT 02/19/2014 FINDINGS: Brain: No evidence of acute infarction, hemorrhage, extra-axial collection, ventriculomegaly, or mass effect. Unchanged chronic small vessel ischemia and lacunar infarct in the right basal ganglia. Vascular: No hyperdense vessel or unexpected calcification.  Skull: Negative for fracture or focal lesion. Sinuses/Orbits: No acute findings. Other: None. IMPRESSION: 1.  No acute intracranial abnormality. 2. Stable chronic small vessel ischemia, moderate to advanced in degree, unchanged remote lacunar infarct. Electronically Signed   By: Jeb Levering M.D.   On: 04/26/2015 05:50   I have personally reviewed and evaluated these images and lab results as part of my medical decision-making.   EKG Interpretation   Date/Time:  Thursday April 26 2015 03:31:11 EDT Ventricular Rate:  80 PR Interval:  175 QRS Duration: 98 QT Interval:  399 QTC Calculation: 460 R Axis:   1 Text Interpretation:  Sinus rhythm Left ventricular hypertrophy Since last  tracing 21 Mar 2010 T wave inversion is no longer Present in  inferiorlateral leads Confirmed by Randee Upchurch  MD-I, Hall Birchard ([id number]) on 04/26/2015  4:00:11 AM      MDM   Final diagnoses:  Hypertensive crisis  Vertigo   Plan admission  Rolland Porter, MD, [place] Performed by: Rolland Porter L Total critical care time: 32 minutes Critical care time was exclusive of separately billable procedures and treating other patients. Critical care was necessary to treat or prevent imminent or life-threatening deterioration. Critical care was time spent personally by me on the following activities: development of treatment plan with patient and/or surrogate as well as nursing, discussions with consultants, evaluation of patient's response to treatment, examination of patient, obtaining history from patient or surrogate, ordering and performing treatments and interventions, ordering and review of laboratory studies, ordering and review of radiographic studies, pulse oximetry and re-evaluation of patient's condition.   Rolland Porter, MD 04/27/15 [phone]

## 2015-04-26 NOTE — ED Notes (Signed)
Pt in MRI.

## 2015-04-26 NOTE — H&P (Addendum)
[name]          History and Physical    PCP:   Vic Blackbird, MD   EDP: Francine Graven, D.O.  Chief Complaint:  Dizziness, "I feel drunk"  HPI: Patient is a 75 year old woman with history of coronary artery disease, history of non-ST elevated MI, hypertension, hyperlipidemia, hypothyroidism among other comorbidities who presents to the hospital with the above complaints. She states these started abruptly yesterday evening at around 8:30 PM. She states that she was staggering all over the house and felt like she had drunk one to many beers although she states she has never been an alcohol drinker in her life. Finally at 2 AM she called her granddaughter who brought her into the hospital for evaluation. Upon arrival to the emergency department she was found to have a blood pressure of 250/125. She was immediately started on a labetalol drip. An MRI of her head was ordered which showed no acute disease however advanced microvascular white matter disease. By the time I am seeing her her blood pressure has already decreased to 150/60, I have requested labetalol drip be turned off. Other than the dizziness she denies shortness of breath, chest pain, she does admit to some mild headaches, no fever. Her dizziness has resolved. We have been asked to admit her for further evaluation and management.  Allergies:   Allergies  Allergen Reactions  . Codeine Nausea And Vomiting      Past Medical History  Diagnosis Date  . NSTEMI (non-ST elevated myocardial infarction) ([place])     2/12  . Cardiomyopathy     Probable Takotsubo, LVEF 30-35% 2/12  . HTN (hypertension)   . Hypothyroidism   . Hyperlipidemia   . Diabetes mellitus   . Stroke ([place])     (2000) residual left-sided weakness  . Carotid artery disease ([place])   . CAD (coronary artery disease)     DES second diagonal 2/12  . Breast cancer ([place])     Tamoxifen started 2007  . Hepatic steatosis   . Pancreas  divisum     Past Surgical History  Procedure Laterality Date  . Carotid endarterectomy      Left  . Mastectomy      Bilateral  . Abdominal hysterectomy    . Cholecystectomy    . Joint replacement      Right [facility]- [facility]    Prior to Admission medications   Medication Sig Start Date End Date Taking? Authorizing Provider  amLODipine (NORVASC) 5 MG tablet TAKE ONE TABLET BY MOUTH EVERY DAY FOR BLOOD PRESSURE  03/15/14  Yes Alycia Rossetti, MD  aspirin 81 MG tablet Take 81 mg by mouth daily.     Yes Historical Provider, MD  carvedilol (COREG) 12.5 MG tablet Take 1.5 tablets (18.75 mg total) by mouth 2 (two) times daily. 01/03/15  Yes Satira Sark, MD  clopidogrel (PLAVIX) 75 MG tablet TAKE ONE TABLET BY MOUTH ONCE DAILY 04/18/15  Yes Alycia Rossetti, MD  CRESTOR 40 MG tablet TAKE ONE TABLET BY MOUTH ONCE DAILY 04/18/15  Yes Alycia Rossetti, MD  HYDROcodone-acetaminophen (NORCO/VICODIN) 5-325 MG tablet Take 1 tablet by mouth 2 (two) times daily as needed for moderate pain. 03/05/15  Yes Alycia Rossetti, MD  nitroGLYCERIN (NITROSTAT) 0.4 MG SL tablet Place 1 tablet (0.4 mg total) under the tongue every 5 (five) minutes x 3 doses as  needed. 01/11/14  Yes Satira Sark, MD  Omega-3 Fatty Acids 300 MG CAPS Take 1 capsule (300 mg total) by mouth 2 (two) times daily. 04/24/11  Yes Donney Dice, PA-C  SYNTHROID 150 MCG tablet Take 1 tablet (150 mcg total) by mouth daily before breakfast. 12/04/14  Yes Philemon Kingdom, MD  valsartan-hydrochlorothiazide (DIOVAN-HCT) 160-25 MG tablet Take 1 tablet by mouth daily. 02/01/15  Yes Alycia Rossetti, MD  albuterol (PROVENTIL HFA;VENTOLIN HFA) 108 (90 Base) MCG/ACT inhaler Inhale 2 puffs into the lungs every 4 (four) hours as needed for wheezing or shortness of breath. Patient not taking: Reported on 04/26/2015 03/27/15   Alycia Rossetti, MD  amoxicillin-clavulanate (AUGMENTIN) [phone] MG tablet  03/25/15   Historical Provider,  MD  docusate sodium (COLACE) 100 MG capsule Take 100 mg by mouth as needed for mild constipation.    Historical Provider, MD  predniSONE (DELTASONE) 20 MG tablet Take 2 tablets (40 mg total) by mouth daily with breakfast. For 5 days 03/27/15   Alycia Rossetti, MD  promethazine-codeine [facility] WITH CODEINE) 6.25-10 MG/5ML syrup  03/25/15   Historical Provider, MD    Social History:  reports that she has never smoked. She has never used smokeless tobacco. She reports that she does not drink alcohol or use illicit drugs.  Family History  Problem Relation Age of Onset  . Heart attack Sister   . Heart disease Sister   . Heart attack Brother   . Heart disease Brother     Review of Systems:  Constitutional: Denies fever, chills, diaphoresis, appetite change and fatigue.  HEENT: Denies photophobia, eye pain, redness, hearing loss, ear pain, congestion, sore throat, rhinorrhea, sneezing, mouth sores, trouble swallowing, neck pain, neck stiffness and tinnitus.   Respiratory: Denies SOB, DOE, cough, chest tightness,  and wheezing.   Cardiovascular: Denies chest pain, palpitations and leg swelling.  Gastrointestinal: Denies nausea, vomiting, abdominal pain, diarrhea, constipation, blood in stool and abdominal distention.  Genitourinary: Denies dysuria, urgency, frequency, hematuria, flank pain and difficulty urinating.  Endocrine: Denies: hot or cold intolerance, sweats, changes in hair or nails, polyuria, polydipsia. Musculoskeletal: Denies myalgias, back pain, joint swelling, arthralgias and gait problem.  Skin: Denies pallor, rash and wound.  Neurological: Denies dizziness, seizures, syncope, weakness, light-headedness, numbness and headaches.  Hematological: Denies adenopathy. Easy bruising, personal or family bleeding history  Psychiatric/Behavioral: Denies suicidal ideation, mood changes, confusion, nervousness, sleep disturbance and agitation   Physical Exam: Blood pressure 161/68, pulse  66, temperature 97.7 F (36.5 C), temperature source Oral, resp. rate 16, height _0  (1.676 m), weight 97.3 kg (214 lb 8.1 oz), SpO2 95 %. Gen., alert, awake, oriented 3 HEENT: Normocephalic, atraumatic, pupils equal round and reactive to light Neck: Supple, no JVD, no lymphadenopathy, no bruits, no goiter and signed cardiovascular: Regular rate and rhythm, strong systolic ejection murmur best heard at the right upper sternal border Lungs: Clear to auscultation bilaterally Abdomen: Obese, soft, nontender, nondistended, positive bowel sounds Extremities: No clubbing, cyanosis or edema, positive pulses Neurologic: Grossly intact and nonfocal  Labs on Admission:  Results for orders placed or performed during the hospital encounter of 04/26/15 (from the past 48 hour(s))  CBC with Differential     Status: Abnormal   Collection Time: 04/26/15  4:30 AM  Result Value Ref Range   WBC 8.6 4.0 - 10.5 K/uL   RBC 5.40 (H) 3.87 - 5.11 MIL/uL   Hemoglobin 13.8 12.0 - 15.0 g/dL   HCT 41.1 36.0 - 46.0 %  MCV 76.1 (L) 78.0 - 100.0 fL   MCH 25.6 (L) 26.0 - 34.0 pg   MCHC 33.6 30.0 - 36.0 g/dL   RDW 16.6 (H) 11.5 - 15.5 %   Platelets 165 150 - 400 K/uL   Neutrophils Relative % 70 %   Neutro Abs 6.0 1.7 - 7.7 K/uL   Lymphocytes Relative 17 %   Lymphs Abs 1.5 0.7 - 4.0 K/uL   Monocytes Relative 9 %   Monocytes Absolute 0.8 0.1 - 1.0 K/uL   Eosinophils Relative 4 %   Eosinophils Absolute 0.4 0.0 - 0.7 K/uL   Basophils Relative 0 %   Basophils Absolute 0.0 0.0 - 0.1 K/uL  Comprehensive metabolic panel     Status: Abnormal   Collection Time: 04/26/15  4:55 AM  Result Value Ref Range   Sodium 138 135 - 145 mmol/L   Potassium 3.9 3.5 - 5.1 mmol/L   Chloride 103 101 - 111 mmol/L   CO2 24 22 - 32 mmol/L   Glucose, Bld 135 (H) 65 - 99 mg/dL   BUN 22 (H) 6 - 20 mg/dL   Creatinine, Ser 0.88 0.44 - 1.00 mg/dL   Calcium 9.7 8.9 - 10.3 mg/dL   Total Protein 8.0 6.5 - 8.1 g/dL   Albumin 4.2 3.5 - 5.0  g/dL   AST 22 15 - 41 U/L   ALT 19 14 - 54 U/L   Alkaline Phosphatase 116 38 - 126 U/L   Total Bilirubin 0.7 0.3 - 1.2 mg/dL   GFR calc non Af Amer >60 >60 mL/min   GFR calc Af Amer >60 >60 mL/min    Comment: (NOTE) The eGFR has been calculated using the CKD EPI equation. This calculation has not been validated in all clinical situations. eGFR's persistently <60 mL/min signify possible Chronic Kidney Disease.    Anion gap 11 5 - 15  Troponin I     Status: None   Collection Time: 04/26/15  4:55 AM  Result Value Ref Range   Troponin I <0.03 <0.031 ng/mL    Comment:        NO INDICATION OF MYOCARDIAL INJURY.   APTT     Status: None   Collection Time: 04/26/15  4:55 AM  Result Value Ref Range   aPTT 33 24 - 37 seconds    Radiological Exams on Admission: Ct Head Wo Contrast  04/26/2015  CLINICAL DATA:  Headache, elevated blood pressure.  Dizziness. EXAM: CT HEAD WITHOUT CONTRAST TECHNIQUE: Contiguous axial images were obtained from the base of the skull through the vertex without intravenous contrast. COMPARISON:  Head CT 02/19/2014 FINDINGS: Brain: No evidence of acute infarction, hemorrhage, extra-axial collection, ventriculomegaly, or mass effect. Unchanged chronic small vessel ischemia and lacunar infarct in the right basal ganglia. Vascular: No hyperdense vessel or unexpected calcification. Skull: Negative for fracture or focal lesion. Sinuses/Orbits: No acute findings. Other: None. IMPRESSION: 1.  No acute intracranial abnormality. 2. Stable chronic small vessel ischemia, moderate to advanced in degree, unchanged remote lacunar infarct. Electronically Signed   By: Jeb Levering M.D.   On: 04/26/2015 05:50   Mr Brain Wo Contrast  04/26/2015  CLINICAL DATA:  Hypertensive crisis.  Dizziness. EXAM: MRI HEAD WITHOUT CONTRAST TECHNIQUE: Multiplanar, multiecho pulse sequences of the brain and surrounding structures were obtained without intravenous contrast. COMPARISON:  CT head  04/26/2015 FINDINGS: Ventricle size normal.  Cerebral volume normal for age. Negative for acute infarct Extensive chronic ischemic changes are present. Extensive microvascular ischemic changes throughout the  white matter. Chronic infarct in the basal ganglia bilaterally. Chronic ischemia in the pons. Negative for white matter edema. Negative for hypertensive encephalopathy. Negative for hemorrhage or mass lesion. No shift of the midline structures. Circle Willis patent. Normal orbit. Paranasal sinuses clear. Negative skull base. IMPRESSION: Extensive chronic microvascular ischemic change. No acute infarct. Negative for hypertensive encephalopathy. Electronically Signed   By: Franchot Gallo M.D.   On: 04/26/2015 07:42    Assessment/Plan Principal Problem:   Hypertensive urgency Active Problems:   Mixed hyperlipidemia   Hypothyroidism   Hypertensive crisis   Acute onset of severe vertigo    Hypertensive emergency -Markedly elevated blood pressure to 250/125 on admission with severe headache and vertigo. -Thankfully MRI negative for acute CVA. -Was started on a labetalol drip, by the time I see her her blood pressure is 150/60 and I have discontinued the labetalol drip. -I have decided to restart her home medications which include: Coreg, valsartan, HCTZ, amlodipine. The only change I am making an dosage of her medication for now is increasing the Norvasc from 5-10 mg. -Patient and her daughter state that she is very compliant with her medications and she has not had any recent changes. -Given her systolic murmur and severely elevated blood pressure, will check 2-D echo to document any possible diastolic CHF.  Dizziness -Resolved, likely secondary to hypertensive emergency. -MRI negative for acute findings.  Hyperlipidemia -Continue statin  Coronary artery disease -Stable, no chest pain  Hypothyroidism -Check TSH, continue home dose of Synthroid.  DVT prophylaxis -Lovenox  CODE  STATUS -Full code    Time Spent on Admission: 85 minutes  HERNANDEZ ACOSTA,Robin [name] Pager: [phone] 04/26/2015, 3:32 PM

## 2015-04-26 NOTE — ED Notes (Signed)
Labetalol decreased to 0.5mg /min.

## 2015-04-26 NOTE — ED Notes (Signed)
When verifying pt's meds, pt states her pharmacist told her the bottle labeled clopidogrel was actually supposed to be crestor and told the patient that clopidogrel was the generic name for crestor and therefore the patient was taking a double dose of clopidogrel instead of a dose of crestor.

## 2015-04-27 ENCOUNTER — Ambulatory Visit: Payer: Medicare PPO | Admitting: Internal Medicine

## 2015-04-27 ENCOUNTER — Inpatient Hospital Stay (HOSPITAL_COMMUNITY): Payer: Medicare PPO

## 2015-04-27 DIAGNOSIS — R079 Chest pain, unspecified: Secondary | ICD-10-CM

## 2015-04-27 LAB — BASIC METABOLIC PANEL
Anion gap: 10 (ref 5–15)
BUN: 23 mg/dL — AB (ref 6–20)
CO2: 24 mmol/L (ref 22–32)
CREATININE: 0.93 mg/dL (ref 0.44–1.00)
Calcium: 9.2 mg/dL (ref 8.9–10.3)
Chloride: 105 mmol/L (ref 101–111)
GFR, EST NON AFRICAN AMERICAN: 58 mL/min — AB (ref >60)
Glucose, Bld: 108 mg/dL — ABNORMAL HIGH (ref 65–99)
POTASSIUM: 3.6 mmol/L (ref 3.5–5.1)
SODIUM: 139 mmol/L (ref 135–145)

## 2015-04-27 LAB — CBC
HEMATOCRIT: 36.7 % (ref 36.0–46.0)
Hemoglobin: 12 g/dL (ref 12.0–15.0)
MCH: 25.2 pg — ABNORMAL LOW (ref 26.0–34.0)
MCHC: 32.7 g/dL (ref 30.0–36.0)
MCV: 77.1 fL — ABNORMAL LOW (ref 78.0–100.0)
PLATELETS: 176 10*3/uL (ref 150–400)
RBC: 4.76 MIL/uL (ref 3.87–5.11)
RDW: 16.5 % — AB (ref 11.5–15.5)
WBC: 7.5 10*3/uL (ref 4.0–10.5)

## 2015-04-27 LAB — ECHOCARDIOGRAM COMPLETE
HEIGHTINCHES: 66 in
WEIGHTICAEL: 3418.01 [oz_av]

## 2015-04-27 MED ORDER — AMLODIPINE BESYLATE 10 MG PO TABS: 10.0000 mg | ORAL_TABLET | Freq: Every day | ORAL | Status: DC

## 2015-04-27 NOTE — Care Management Important Message (Signed)
Important Message  Patient Details  Name: Robin [name] MRN: [id number] Date of Birth: May 22, 1940   Medicare Important Message Given:  Yes    Sherald Barge, RN 04/27/2015, 8:41 AM

## 2015-04-27 NOTE — Progress Notes (Signed)
D/c instructions reviewed with patient. Verbalized understanding. Patient to be dc'd to home with family after echocardiogram.

## 2015-04-27 NOTE — Care Management Note (Signed)
Case Management Note  Patient Details  Name: Robin [name] MRN: [id number] Date of Birth: Apr 30, 1940  Subjective/Objective:                  Pt admitted with HTN urgency. Pt is from home, lives with a granddaughter and is ind with ALD's. Pt has large support system, family at bedside. Pt has PCP, transportation to appointments and no difficulty obtaining medications. Pt has no HH services PTA or DME needs. Pt plans to return home with self care at discharge.   Action/Plan: No CM needs anticipated.   Expected Discharge Date:     04/27/2015             Expected Discharge Plan:  Home/Self Care  In-House Referral:  NA  Discharge planning Services  CM Consult  Post Acute Care Choice:  NA Choice offered to:  NA  DME Arranged:    DME Agency:     HH Arranged:    HH Agency:     Status of Service:  Completed, signed off  Medicare Important Message Given:  Yes Date Medicare IM Given:    Medicare IM give by:    Date Additional Medicare IM Given:    Additional Medicare Important Message give by:     If discussed at [place] of Stay Meetings, dates discussed:    Additional Comments:  Sherald Barge, RN 04/27/2015, 8:41 AM

## 2015-04-27 NOTE — Discharge Summary (Signed)
Physician Discharge Summary  Robin [name] [id number] DOB: 1940/02/14 DOA: 04/26/2015  PCP: Vic Blackbird, MD  Admit date: 04/26/2015 Discharge date: 04/27/2015  Time spent: 45 minutes  Recommendations for Outpatient Follow-up:  -We'll be discharged home today. -Advised to follow-up with primary care provider in 2 weeks for continued blood pressure monitoring and antihypertensive adjustment.   Discharge Diagnoses:  Principal Problem:   Hypertensive urgency Active Problems:   Mixed hyperlipidemia   Hypothyroidism   Hypertensive crisis   Acute onset of severe vertigo   Discharge Condition: Stable and improved  Filed Weights   04/26/15 1240 04/27/15 0500  Weight: 97.3 kg (214 lb 8.1 oz) 96.9 kg (213 lb 10 oz)    History of present illness:  Patient is a 75 year old woman with history of coronary artery disease, history of non-ST elevated MI, hypertension, hyperlipidemia, hypothyroidism among other comorbidities who presents to the hospital with the above complaints. She states these started abruptly yesterday evening at around 8:30 PM. She states that she was staggering all over the house and felt like she had drunk one to many beers although she states she has never been an alcohol drinker in her life. Finally at 2 AM she called her granddaughter who brought her into the hospital for evaluation. Upon arrival to the emergency department she was found to have a blood pressure of 250/125. She was immediately started on a labetalol drip. An MRI of her head was ordered which showed no acute disease however advanced microvascular white matter disease. By the time I am seeing her her blood pressure has already decreased to 150/60, I have requested labetalol drip be turned off. Other than the dizziness she denies shortness of breath, chest pain, she does admit to some mild headaches, no fever. Her dizziness has resolved. We have been asked to admit her for further evaluation and  management.   Hospital Course:   Hypertensive emergency -Blood pressure on admission was 250/125 she also had severe headache and vertigo. MRI negative for acute findings. -She did require a labetalol drip transiently which has been since turned off. She is to continue her home doses of Coreg, hydrochlorothiazide, valsartan, amlodipine dose has been increased from 5-10 mg. -She is currently exhibiting excellent blood pressure control with systolic blood pressure range of 123456 and diastolic in the 123456.  Hyperlipidemia  -to continue statin  Hypothyroidism  -continue Synthroid.  Coronary artery disease  -stable, no chest pain  Procedures:  None   Consultations:  None  Discharge Instructions  Discharge Instructions    Diet - low sodium heart healthy    Complete by:  As directed      Increase activity slowly    Complete by:  As directed             Medication List    STOP taking these medications        albuterol 108 (90 Base) MCG/ACT inhaler  Commonly known as:  PROVENTIL HFA;VENTOLIN HFA     amoxicillin-clavulanate 875-125 MG tablet  Commonly known as:  AUGMENTIN      TAKE these medications        amLODipine 10 MG tablet  Commonly known as:  NORVASC  Take 1 tablet (10 mg total) by mouth daily.     aspirin 81 MG tablet  Take 81 mg by mouth daily.     carvedilol 12.5 MG tablet  Commonly known as:  COREG  Take 1.5 tablets (18.75 mg total) by mouth 2 (two) times  daily.     clopidogrel 75 MG tablet  Commonly known as:  PLAVIX  TAKE ONE TABLET BY MOUTH ONCE DAILY     CRESTOR 40 MG tablet  Generic drug:  rosuvastatin  TAKE ONE TABLET BY MOUTH ONCE DAILY     docusate sodium 100 MG capsule  Commonly known as:  COLACE  Take 100 mg by mouth as needed for mild constipation.     HYDROcodone-acetaminophen 5-325 MG tablet  Commonly known as:  NORCO/VICODIN  Take 1 tablet by mouth 2 (two) times daily as needed for moderate pain.     nitroGLYCERIN 0.4 MG  SL tablet  Commonly known as:  NITROSTAT  Place 1 tablet (0.4 mg total) under the tongue every 5 (five) minutes x 3 doses as needed.     Omega-3 Fatty Acids 300 MG Caps  Take 1 capsule (300 mg total) by mouth 2 (two) times daily.     predniSONE 20 MG tablet  Commonly known as:  DELTASONE  Take 2 tablets (40 mg total) by mouth daily with breakfast. For 5 days     promethazine-codeine 6.25-10 MG/5ML syrup  Commonly known as:  PHENERGAN with CODEINE     SYNTHROID 150 MCG tablet  Generic drug:  levothyroxine  Take 1 tablet (150 mcg total) by mouth daily before breakfast.     valsartan-hydrochlorothiazide 160-25 MG tablet  Commonly known as:  DIOVAN-HCT  Take 1 tablet by mouth daily.       Allergies  Allergen Reactions  . Codeine Nausea And Vomiting       Follow-up Information    Follow up with Vic Blackbird, MD. Schedule an appointment as soon as possible for a visit in 2 weeks.   Specialty:  Family Medicine   Contact information:   [address] [place] [address] [phone]        The results of significant diagnostics from this hospitalization (including imaging, microbiology, ancillary and laboratory) are listed below for reference.    Significant Diagnostic Studies: Ct Head Wo [name]  04/26/2015  CLINICAL DATA:  Headache, elevated blood pressure.  Dizziness. EXAM: CT HEAD WITHOUT [name] TECHNIQUE: Contiguous axial images were obtained from the base of the skull through the vertex without intravenous [name]. COMPARISON:  Head CT 02/19/2014 FINDINGS: Brain: No evidence of acute infarction, hemorrhage, extra-axial collection, ventriculomegaly, or mass effect. Unchanged chronic small vessel ischemia and lacunar infarct in the right basal ganglia. Vascular: No hyperdense vessel or unexpected calcification. Skull: Negative for fracture or focal lesion. Sinuses/Orbits: No acute findings. Other: None. IMPRESSION: 1.  No acute intracranial abnormality. 2. Stable  chronic small vessel ischemia, moderate to advanced in degree, unchanged remote lacunar infarct. Electronically Signed   By: Jeb Levering M.D.   On: 04/26/2015 05:50   Robin [name]  04/26/2015  CLINICAL DATA:  Hypertensive crisis.  Dizziness. EXAM: MRI HEAD WITHOUT [name] TECHNIQUE: Multiplanar, multiecho pulse sequences of the brain and surrounding structures were obtained without intravenous [name]. COMPARISON:  CT head 04/26/2015 FINDINGS: Ventricle size normal.  Cerebral volume normal for age. Negative for acute infarct Extensive chronic ischemic changes are present. Extensive microvascular ischemic changes throughout the white matter. Chronic infarct in the basal ganglia bilaterally. Chronic ischemia in the pons. Negative for white matter edema. Negative for hypertensive encephalopathy. Negative for hemorrhage or mass lesion. No shift of the midline structures. Circle Willis patent. Normal orbit. Paranasal sinuses clear. Negative skull base. IMPRESSION: Extensive chronic microvascular ischemic change. No acute infarct. Negative  for hypertensive encephalopathy. Electronically Signed   By: Franchot Gallo M.D.   On: 04/26/2015 07:42    Microbiology: Recent Results (from the past 240 hour(s))  MRSA PCR Screening     Status: None   Collection Time: 04/26/15  1:10 PM  Result Value Ref Range Status   MRSA by PCR NEGATIVE NEGATIVE Final    Comment:        The GeneXpert MRSA Assay (FDA approved for NASAL specimens only), is one component of a comprehensive MRSA colonization surveillance program. It is not intended to diagnose MRSA infection nor to guide or monitor treatment for MRSA infections.      Labs: Basic Metabolic Panel:  Recent Labs Lab 04/26/15 0455 04/27/15 0408  NA 138 139  K 3.9 3.6  CL 103 105  CO2 24 24  GLUCOSE 135* 108*  BUN 22* 23*  CREATININE 0.88 0.93  CALCIUM 9.7 9.2   Liver Function Tests:  Recent Labs Lab 04/26/15 0455  AST 22  ALT 19    ALKPHOS 116  BILITOT 0.7  PROT 8.0  ALBUMIN 4.2   No results for input(s): LIPASE, AMYLASE in the last 168 hours. No results for input(s): AMMONIA in the last 168 hours. CBC:  Recent Labs Lab 04/26/15 0430 04/27/15 0408  WBC 8.6 7.5  NEUTROABS 6.0  --   HGB 13.8 12.0  HCT 41.1 36.7  MCV 76.1* 77.1*  PLT 165 176   Cardiac Enzymes:  Recent Labs Lab 04/26/15 0455  TROPONINI <0.03   BNP: BNP (last 3 results) No results for input(s): BNP in the last 8760 hours.  ProBNP (last 3 results) No results for input(s): PROBNP in the last 8760 hours.  CBG: No results for input(s): GLUCAP in the last 168 hours.     SignedLelon Frohlich  [facility] Pager: [phone] 04/27/2015, 5:10 PM

## 2015-05-09 ENCOUNTER — Encounter: Payer: Self-pay | Admitting: Family Medicine

## 2015-05-09 ENCOUNTER — Ambulatory Visit (INDEPENDENT_AMBULATORY_CARE_PROVIDER_SITE_OTHER): Payer: Medicare PPO | Admitting: Family Medicine

## 2015-05-09 VITALS — BP 142/78 | HR 64 | Temp 97.8°F | Resp 12 | Ht 65.0 in | Wt 222.0 lb

## 2015-05-09 DIAGNOSIS — I429 Cardiomyopathy, unspecified: Secondary | ICD-10-CM | POA: Diagnosis not present

## 2015-05-09 DIAGNOSIS — H9319 Tinnitus, unspecified ear: Secondary | ICD-10-CM | POA: Diagnosis not present

## 2015-05-09 DIAGNOSIS — I10 Essential (primary) hypertension: Secondary | ICD-10-CM | POA: Diagnosis not present

## 2015-05-09 DIAGNOSIS — E039 Hypothyroidism, unspecified: Secondary | ICD-10-CM | POA: Diagnosis not present

## 2015-05-09 NOTE — Progress Notes (Signed)
Patient ID: Robin [name], female   DOB: 09-29-1940, 75 y.o.   MRN: [id number]    Subjective:    Patient ID: Robin [name], female    DOB: [birth year], 75 y.o.   MRN: [id number]  Patient presents for Hospital F/U Patient here for hospital follow-up. She was admitted overnight secondary to dizziness and feeling drunk acutely. Her blood pressure was elevated she was admitted secondary to hypertensive urgency. Her blood pressure was 250/125 associate with headache and vertigo. MRI was done which not show any acute findings. She was initially placed on a labetalol drip and then transitioned to her home medications. Her amlodipine was increased to 10 mg otherwise the other medications were kept the same. She did not have any chest pain or difficulty breathing. Her renal function was preserved there is no sign of any anemia. Her EKG did not show any new acute findings. Her TSH was low at 0.3 she is followed by endocrinology her current dose of Synthroid 150 g. She does continue to have a mild headache however she thinks this is due to her glasses as it only happens when she tries to look out of her bifocal. She has an appointment to see her eye doctor on May 1.  Also complained of some ringing in her ears this is been present for quite some time and has not changed. She feels like her hearing is okay. Review Of Systems:  GEN- denies fatigue, fever, weight loss,weakness, recent illness HEENT- denies eye drainage, change in vision, nasal discharge, CVS- denies chest pain, palpitations RESP- denies SOB, cough, wheeze ABD- denies N/V, change in stools, abd pain GU- denies dysuria, hematuria, dribbling, incontinence MSK- denies joint pain, muscle aches, injury Neuro- denies headache, dizziness, syncope, seizure activity       Objective:    BP 142/78 mmHg  Pulse 64  Temp(Src) 97.8 F (36.6 C) (Oral)  Resp 12  Ht 5\' 5"  (1.651 m)  Wt 222 lb (100.699 kg)  BMI 36.94 kg/m2 GEN- NAD, alert and  oriented x3 HEENT- PERRL, EOMI, non injected sclera, pink conjunctiva, MMM, oropharynx clear Neck- Supple, no thyromegaly CVS- RRR, 2/6 SEM  RESP-CTAB EXT- No edema Pulses- Radial, DP- 2+        Assessment & Plan:      Problem List Items Addressed This Visit    Tinnitus    Chronic problems she's had for years. It honestly has not changed. There is no change in her actual hearing.      Secondary cardiomyopathy ([place])   Hypothyroidism    I'm concerned that her thyroid may contribute to her blood pressure changes. I think she may benefit from a dose reduction to maybe about 137 g a day. I will contact her endocrinologist for her recommendations.      Essential hypertension, benign - Primary    Her blood pressure is back at her baseline. She will see her cardiologist this Friday. I will continue her current regimen with amlodipine Diovan HCTZ and carvedilol. I'm actually quite surprised that she did not have a stroke or other cardiac event with her blood pressure in the 200s.         Note: This dictation was prepared with Dragon dictation along with smaller phrase technology. Any transcriptional errors that result from this process are unintentional.

## 2015-05-09 NOTE — Assessment & Plan Note (Signed)
Chronic problems she's had for years. It honestly has not changed. There is no change in her actual hearing.

## 2015-05-09 NOTE — Patient Instructions (Addendum)
We will contact your endocrinologist about your levels  Continue current medications F/U as previous

## 2015-05-09 NOTE — Assessment & Plan Note (Signed)
Her blood pressure is back at her baseline. She will see her cardiologist this Friday. I will continue her current regimen with amlodipine Diovan HCTZ and carvedilol. I'm actually quite surprised that she did not have a stroke or other cardiac event with her blood pressure in the 200s.

## 2015-05-09 NOTE — Assessment & Plan Note (Signed)
I'm concerned that her thyroid may contribute to her blood pressure changes. I think she may benefit from a dose reduction to maybe about 137 g a day. I will contact her endocrinologist for her recommendations.

## 2015-05-10 ENCOUNTER — Telehealth: Payer: Self-pay | Admitting: *Deleted

## 2015-05-10 NOTE — Telephone Encounter (Signed)
Please call and see if pt went to ER , if she didn't go what is blood pressure running?  Also change her Synthroid dose to 137mg  once a day, this may also be causing some of the BP issues.

## 2015-05-10 NOTE — Telephone Encounter (Signed)
Received call from patient daughter, [name].   Reports that pt BP noted at 231/130. States that BP was repeated in the other arm and noted even higher.   States that patient denies vertigo, HA, dyspnea or chest pain.   Advised to go directly to ER for evaluation.   Call placed to [facility] ER to give report.   MD to be made aware.

## 2015-05-11 ENCOUNTER — Encounter: Payer: Medicare PPO | Admitting: Cardiology

## 2015-05-11 MED ORDER — LEVOTHYROXINE SODIUM 137 MCG PO TABS: 137.0000 ug | ORAL_TABLET | Freq: Every day | ORAL | Status: DC

## 2015-05-11 NOTE — Telephone Encounter (Signed)
Patient daughter, [name] returned call. Reports that patient BP this morning is 151/63.  MD to be made aware.

## 2015-05-11 NOTE — Telephone Encounter (Signed)
Okay, keep monitoring BP, make sure she has taken meds Take the new dose of thyroid medication She needs repeat thyroid check in 6 weeks She needs to schedule OV with Dr. Renne Crigler for 6 weeks as well- they can call for this

## 2015-05-11 NOTE — Telephone Encounter (Signed)
Call placed to patient daughter, [name].  She reports that she re-checked patient BP and noted to be around 150/90 when she arrived yesterday morning. States that patient believes she put the cuff on wrong, so they continued to monitor patient and did not go to ER as advised.   Advised daughter of MD recommendations to reduce Levothyroxine. Prescription sent to pharmacy.   Patient daughter will contact office with BP reading this AM.

## 2015-05-14 MED ORDER — SYNTHROID 137 MCG PO TABS: 137.0000 ug | ORAL_TABLET | Freq: Every day | ORAL | Status: DC

## 2015-05-14 NOTE — Telephone Encounter (Signed)
We called in Levothyroxine but pt needs Synthroid 137 mcg called in because the Levothyroxine does not work for her body.  Daughter [name] [facility] * she says that we will need to provide the DAW-1#

## 2015-05-14 NOTE — Telephone Encounter (Signed)
Prescription resubmitted

## 2015-05-16 ENCOUNTER — Telehealth: Payer: Self-pay | Admitting: Family Medicine

## 2015-05-16 NOTE — Telephone Encounter (Signed)
Routed fmla forms for Robin [name] on 05/16/15  At 2:39

## 2015-05-17 NOTE — Telephone Encounter (Signed)
Received FMLA forms for patient daughter.   Please see documentation under correct patient.

## 2015-05-17 NOTE — Telephone Encounter (Signed)
As of right now, no forms have been received from front office.

## 2015-05-21 DIAGNOSIS — H1013 Acute atopic conjunctivitis, bilateral: Secondary | ICD-10-CM | POA: Diagnosis not present

## 2015-05-21 DIAGNOSIS — H25813 Combined forms of age-related cataract, bilateral: Secondary | ICD-10-CM | POA: Diagnosis not present

## 2015-05-21 DIAGNOSIS — H0289 Other specified disorders of eyelid: Secondary | ICD-10-CM | POA: Diagnosis not present

## 2015-05-22 ENCOUNTER — Telehealth: Payer: Self-pay | Admitting: Family Medicine

## 2015-05-30 ENCOUNTER — Other Ambulatory Visit: Payer: Self-pay | Admitting: Internal Medicine

## 2015-07-09 ENCOUNTER — Encounter: Payer: Medicare PPO | Admitting: Family Medicine

## 2015-07-11 ENCOUNTER — Ambulatory Visit: Payer: Medicare PPO | Admitting: Cardiology

## 2015-07-12 ENCOUNTER — Encounter: Payer: Self-pay | Admitting: Cardiology

## 2015-07-12 ENCOUNTER — Ambulatory Visit (INDEPENDENT_AMBULATORY_CARE_PROVIDER_SITE_OTHER): Payer: Medicare PPO | Admitting: Cardiology

## 2015-07-12 VITALS — BP 151/70 | HR 66 | Ht 65.0 in | Wt 225.6 lb

## 2015-07-12 DIAGNOSIS — Z8679 Personal history of other diseases of the circulatory system: Secondary | ICD-10-CM | POA: Diagnosis not present

## 2015-07-12 DIAGNOSIS — I2510 Atherosclerotic heart disease of native coronary artery without angina pectoris: Secondary | ICD-10-CM | POA: Diagnosis not present

## 2015-07-12 DIAGNOSIS — E785 Hyperlipidemia, unspecified: Secondary | ICD-10-CM | POA: Diagnosis not present

## 2015-07-12 DIAGNOSIS — I10 Essential (primary) hypertension: Secondary | ICD-10-CM

## 2015-07-12 NOTE — Progress Notes (Signed)
Cardiology Office Note  Date: 07/12/2015   ID: ROSSE KNIPE, DOB 1940-08-20, MRN [id number]  PCP: Vic Blackbird, MD  Primary Cardiologist: Rozann Lesches, MD   Chief Complaint  Patient presents with  . Coronary Artery Disease  . History of cardiomyopathy    History of Present Illness: Robin [name] is a 75 y.o. female last seen in December 2016. She is here with her daughter today for a follow-up visit. I reviewed interval records. She was hospitalized back in April with hypertensive urgency, no definite trigger. Reported compliance with medications at that time. She did not have evidence of acute CNS event by brain MRI, underwent stabilization of blood pressure with medication adjustments, was discharged on higher dose Norvasc.  She presents today stating that she has done well since that time. Recent systolic blood pressure in the 130s to 150s. No chest pain symptoms.  I reviewed her medications which include Norvasc, Coreg, and Diovan HCT. I also went over her lab work, echocardiogram and ECG from April.  Past Medical History  Diagnosis Date  . NSTEMI (non-ST elevated myocardial infarction) ([place])     2/12  . Cardiomyopathy     Probable Takotsubo, LVEF 30-35% 2/12  . HTN (hypertension)   . Hypothyroidism   . Hyperlipidemia   . Diabetes mellitus   . 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 Carotid artery disease ([place])   . CAD (coronary artery disease)     DES second diagonal 2/12  . Breast cancer ([place])     Tamoxifen started 2007  . Hepatic steatosis   . Pancreas divisum     Past Surgical History  Procedure Laterality Date  . Carotid endarterectomy      Left  . Mastectomy      Bilateral  . Abdominal hysterectomy    . Cholecystectomy    . Joint replacement      Right knee replacement- [facility]. Sebastian [facility]    Current Outpatient Prescriptions  Medication Sig Dispense Refill  . amLODipine (NORVASC) 10 MG tablet Take 1 tablet (10 mg  total) by mouth daily. 30 tablet 2  . aspirin 81 MG tablet Take 81 mg by mouth daily.      . carvedilol (COREG) 12.5 MG tablet Take 1.5 tablets (18.75 mg total) by mouth 2 (two) times daily. 270 tablet 3  . clopidogrel (PLAVIX) 75 MG tablet TAKE ONE TABLET BY MOUTH ONCE DAILY 30 tablet 6  . CRESTOR 40 MG tablet TAKE ONE TABLET BY MOUTH ONCE DAILY 30 tablet 6  . docusate sodium (COLACE) 100 MG capsule Take 100 mg by mouth as needed for mild constipation.    Marland Kitchen HYDROcodone-acetaminophen (NORCO/VICODIN) 5-325 MG tablet Take 1 tablet by mouth 2 (two) times daily as needed for moderate pain. 45 tablet 0  . nitroGLYCERIN (NITROSTAT) 0.4 MG SL tablet Place 1 tablet (0.4 mg total) under the tongue every 5 (five) minutes x 3 doses as needed. 25 tablet 3  . Omega-3 Fatty Acids 300 MG CAPS Take 1 capsule (300 mg total) by mouth 2 (two) times daily.    Marland Kitchen SYNTHROID 150 MCG tablet TAKE 1 TABLET BY MOUTH ONCE DAILY BEFORE BREAKFAST. 90 tablet 1  . valsartan-hydrochlorothiazide (DIOVAN-HCT) 160-25 MG tablet Take 1 tablet by mouth daily. 90 tablet 1   No current facility-administered medications for this visit.   Allergies:  Codeine   Social History: The patient  reports that she has never smoked. She has never used  smokeless tobacco. She reports that she does not drink alcohol or use illicit drugs.   Family History: The patient's family history includes Heart attack in her brother and sister; Heart disease in her brother and sister.   ROS:  Please see the history of present illness. Otherwise, complete review of systems is positive for none.  All other systems are reviewed and negative.   Physical Exam: VS:  BP 151/70 mmHg  Pulse 66  Ht 5\' 5"  (1.651 m)  Wt 225 lb 9.6 oz (102.331 kg)  BMI 37.54 kg/m2  SpO2 98%, BMI Body mass index is 37.54 kg/(m^2).  Wt Readings from Last 3 Encounters:  07/12/15 225 lb 9.6 oz (102.331 kg)  05/09/15 222 lb (100.699 kg)  04/27/15 213 lb 10 oz (96.9 kg)    Obese woman  in no acute distress.  HEENT: Conjunctiva and lids normal, oropharynx clear with moist mucosa.  Neck: Supple, no elevated JVP or carotid bruits, no thyromegaly.  Lungs: Clear to auscultation, nonlabored breathing at rest.  Cardiac: Regular rate and rhythm, no S3, 2/6 systolic murmur, no pericardial rub.  Abdomen: Soft, nontender, no hepatomegaly, bowel sounds present, no guarding or rebound.  Extremities: 1+ ankle edema bilaterally.  Skin: Warm and dry.  Musculoskeletal: No kyphosis  Neuropsychiatric: Alert and oriented 3, affect appropriate.  ECG: I personally reviewed the tracing from 04/26/2015 which showed sinus rhythm with increased voltage.  Recent Labwork: 04/26/2015: ALT 19; AST 22; TSH 0.032* 04/27/2015: BUN 23*; Creatinine, Ser 0.93; Hemoglobin 12.0; Platelets 176; Potassium 3.6; Sodium 139     Component Value Date/Time   CHOL 169 03/05/2015 0826   TRIG 97 03/05/2015 0826   HDL 40* 03/05/2015 0826   CHOLHDL 4.2 03/05/2015 0826   VLDL 19 03/05/2015 0826   LDLCALC 110 03/05/2015 0826    Other Studies Reviewed Today:  Echocardiogram 04/27/2015: Study Conclusions  - Left ventricle: The cavity size was normal. Wall thickness was  increased in a pattern of severe LVH. Systolic function was  normal. The estimated ejection fraction was in the range of 60%  to 65%. Wall motion was normal; there were no regional wall  motion abnormalities. Doppler parameters are consistent with  abnormal left ventricular relaxation (grade 1 diastolic  dysfunction). Doppler parameters are consistent with high  ventricular filling pressure. - Aortic valve: Mildly calcified annulus. Trileaflet; mildly  calcified leaflets. - Mitral valve: Calcified annulus. There was trivial regurgitation. - Left atrium: The atrium was mildly dilated. - Right atrium: Central venous pressure (est): 3 mm Hg. - Tricuspid valve: There was trivial regurgitation. - Pulmonary arteries: PA peak pressure: 26  mm Hg (S). - Pericardium, extracardiac: There was no pericardial effusion.  Impressions:  - Severe LVH with LVEF 60-65%. Grade 1 diastolic dysfunction with  increased LV filling pressures. Mild left atrial enlargement. MAC  with trivial mitral regurgitation. Mildly sclerotic aortic valve.  Trivial tricuspid regurgitation with PASP 26 mmHg mmHg.  Lexiscan Cardiolite April 2013: Inferolateral scar with mild peri-infarct ischemia, LVEF 61%.  Assessment and Plan:  1. History of Tako-tsubo cardiomyopathy with normalization of LVEF. Recent echocardiogram from April reviewed above.  2. Episode of hypertensive urgency, blood pressure is now better controlled on current regimen. Agree with increase in Norvasc. Continue to follow at home.  3. CAD status post DES to the second diagonal in 2012. Cardiolite study from 2013 was overall low risk. No active angina symptoms.  4. Hyperlipidemia, continues on Crestor.  Current medicines were reviewed with the patient today.  Disposition:  Follow-up with me in 6 months.  Signed, Satira Sark, MD, [facility] 07/12/2015 2:01 PM    [facility] [place] [facility] [place], [place], [place] [address] Phone: [phone]; Fax: [phone]

## 2015-07-12 NOTE — Patient Instructions (Signed)

## 2015-07-17 ENCOUNTER — Encounter: Payer: Self-pay | Admitting: Family Medicine

## 2015-07-17 ENCOUNTER — Ambulatory Visit (INDEPENDENT_AMBULATORY_CARE_PROVIDER_SITE_OTHER): Payer: Medicare PPO | Admitting: Family Medicine

## 2015-07-17 ENCOUNTER — Ambulatory Visit: Payer: Medicare PPO | Admitting: Family Medicine

## 2015-07-17 VITALS — BP 144/76 | HR 82 | Temp 98.0°F | Resp 14 | Ht 65.0 in | Wt 226.0 lb

## 2015-07-17 DIAGNOSIS — E782 Mixed hyperlipidemia: Secondary | ICD-10-CM

## 2015-07-17 DIAGNOSIS — I10 Essential (primary) hypertension: Secondary | ICD-10-CM | POA: Diagnosis not present

## 2015-07-17 DIAGNOSIS — Z13820 Encounter for screening for osteoporosis: Secondary | ICD-10-CM | POA: Diagnosis not present

## 2015-07-17 DIAGNOSIS — Z0000 Encounter for general adult medical examination without abnormal findings: Secondary | ICD-10-CM | POA: Diagnosis not present

## 2015-07-17 DIAGNOSIS — E039 Hypothyroidism, unspecified: Secondary | ICD-10-CM | POA: Diagnosis not present

## 2015-07-17 DIAGNOSIS — R7303 Prediabetes: Secondary | ICD-10-CM

## 2015-07-17 DIAGNOSIS — I2510 Atherosclerotic heart disease of native coronary artery without angina pectoris: Secondary | ICD-10-CM | POA: Diagnosis not present

## 2015-07-17 LAB — COMPREHENSIVE METABOLIC PANEL
ALT: 16 U/L (ref 6–29)
AST: 19 U/L (ref 10–35)
Albumin: 4.3 g/dL (ref 3.6–5.1)
Alkaline Phosphatase: 99 U/L (ref 33–130)
BILIRUBIN TOTAL: 0.5 mg/dL (ref 0.2–1.2)
BUN: 36 mg/dL — ABNORMAL HIGH (ref 7–25)
CALCIUM: 10 mg/dL (ref 8.6–10.4)
CO2: 22 mmol/L (ref 20–31)
Chloride: 103 mmol/L (ref 98–110)
Creat: 1.37 mg/dL — ABNORMAL HIGH (ref 0.60–0.93)
GLUCOSE: 100 mg/dL — AB (ref 70–99)
Potassium: 4.1 mmol/L (ref 3.5–5.3)
Sodium: 138 mmol/L (ref 135–146)
TOTAL PROTEIN: 7.4 g/dL (ref 6.1–8.1)

## 2015-07-17 LAB — CBC WITH DIFFERENTIAL/PLATELET
BASOS PCT: 0 %
Basophils Absolute: 0 cells/uL (ref 0–200)
Eosinophils Absolute: 395 cells/uL (ref 15–500)
Eosinophils Relative: 5 %
HEMATOCRIT: 34.5 % — AB (ref 35.0–45.0)
HEMOGLOBIN: 11.4 g/dL — AB (ref 12.0–15.0)
LYMPHS ABS: 1738 {cells}/uL (ref 850–3900)
LYMPHS PCT: 22 %
MCH: 26 pg — ABNORMAL LOW (ref 27.0–33.0)
MCHC: 33 g/dL (ref 32.0–36.0)
MCV: 78.6 fL — ABNORMAL LOW (ref 80.0–100.0)
MONO ABS: 553 {cells}/uL (ref 200–950)
MPV: 10.6 fL (ref 7.5–12.5)
Monocytes Relative: 7 %
NEUTROS ABS: 5214 {cells}/uL (ref 1500–7800)
Neutrophils Relative %: 66 %
Platelets: 170 10*3/uL (ref 140–400)
RBC: 4.39 MIL/uL (ref 3.80–5.10)
RDW: 17.1 % — AB (ref 11.0–15.0)
WBC: 7.9 10*3/uL (ref 3.8–10.8)

## 2015-07-17 LAB — HEMOGLOBIN A1C
Hgb A1c MFr Bld: 6.2 % — ABNORMAL HIGH (ref <5.7)
Mean Plasma Glucose: 131 mg/dL

## 2015-07-17 LAB — LIPID PANEL
CHOLESTEROL: 132 mg/dL (ref 125–200)
HDL: 43 mg/dL — ABNORMAL LOW (ref >=46)
LDL CALC: 68 mg/dL (ref <130)
Total CHOL/HDL Ratio: 3.1 Ratio (ref <=5.0)
Triglycerides: 103 mg/dL (ref <150)
VLDL: 21 mg/dL (ref <30)

## 2015-07-17 MED ORDER — LOSARTAN POTASSIUM-HCTZ 50-12.5 MG PO TABS: 1.0000 | ORAL_TABLET | Freq: Every day | ORAL | Status: DC

## 2015-07-17 MED ORDER — HYDROCODONE-ACETAMINOPHEN 5-325 MG PO TABS: 1.0000 | ORAL_TABLET | Freq: Two times a day (BID) | ORAL | Status: DC | PRN

## 2015-07-17 NOTE — Progress Notes (Signed)
Patient ID: Robin [name], female   DOB: 10/13/1940, 75 y.o.   MRN: [id number] Subjective:   Patient presents for Medicare Annual/Subsequent preventive examination.  Patient for annual wellness exam Reviewed her last cardiology note medications reviewed in detail Her only concern is that her Diovan HCT causes her to have headaches. She states that she's been on this medicine for years but she finally figured out which one was causing issues. She stopped the medicine a week ago and has been headache free.  Home BP readings past 2 days 127/47 144/58 Review Past Medical/Family/Social: per EMR   Risk Factors  Current exercise habits: none  Dietary issues discussed: Yes  Cardiac risk factors: Obesity (BMI >= 30 kg/m2). CAD   Depression Screen  (Note: if answer to either of the following is "Yes", a more complete depression screening is indicated)  Over the past two weeks, have you felt down, depressed or hopeless? No Over the past two weeks, have you felt little interest or pleasure in doing things? No Have you lost interest or pleasure in daily life? No Do you often feel hopeless? No Do you cry easily over simple problems? No   Activities of Daily Living  In your present state of health, do you have any difficulty performing the following activities?:  Driving? No  Managing money? No  Feeding yourself? No  Getting from bed to chair? No  Climbing a flight of stairs? No  Preparing food and eating?: No  Bathing or showering? No  Getting dressed: No  Getting to the toilet? No  Using the toilet:No  Moving around from place to place: No  In the past year have you fallen or had a near fall?:No  Are you sexually active? No  Do you have more than one partner? No   Hearing Difficulties: No  Do you often ask people to speak up or repeat themselves? No  Do you experience ringing or noises in your ears? yes Do you have difficulty understanding soft or whispered voices? No  Do you feel  that you have a problem with memory? No Do you often misplace items? No  Do you feel safe at home? Yes  Cognitive Testing  Alert? Yes Normal Appearance?Yes  Oriented to person? Yes Place? Yes  Time? Yes  Recall of three objects? Yes  Can perform simple calculations? Yes  Displays appropriate judgment?Yes  Can read the correct time from a watch face?Yes   List the Names of Other Physician/Practitioners you currently use: Cardiology,orthopedics   Endocrine- for thyroid  Screening Tests / Date Colonoscopy   Not indicated                   Zostavax - not covered  Mammogram - age out remote history of breast cancer  Influenza Vaccine UTD  Tetanus/tdap UTD Pneumonia- UTD  ROS: GEN- denies fatigue, fever, weight loss,weakness, recent illness HEENT- denies eye drainage, change in vision, nasal discharge, CVS- denies chest pain, palpitations RESP- denies SOB, cough, wheeze ABD- denies N/V, change in stools, abd pain GU- denies dysuria, hematuria, dribbling, incontinence MSK- + joint pain, muscle aches, injury Neuro- denies headache, dizziness, syncope, seizure activity  Physical:  GEN- NAD, alert and oriented x3,obese HEENT- PERRL, EOMI, non injected sclera, pink conjunctiva, MMM, oropharynx clear Neck- Supple, no thryomegaly CVS- RRR, 2/6 SEM  RESP-CTAB ABD-NABS,soft,NT,ND  EXT- No edema LE  , lymphedema LUE Pulses- Radial, DP- 2+      Assessment:    Annual wellness medicare exam  Plan:    During the course of the visit the patient was educated and counseled about appropriate screening and preventive services including:  Bone Density- pt to schedule   Shingles vaccine.- not covered pt has tried   Screen Neg for depression.   Diet review for nutrition referral? Yes ____ Not Indicated __x__  Patient Instructions (the written plan) was given to the patient.  Medicare Attestation  I have personally reviewed:  The patient's medical and social history  Their use of  alcohol, tobacco or illicit drugs  Their current medications and supplements  The patient's functional ability including ADLs,fall risks, home safety risks, cognitive, and hearing and visual impairment  Diet and physical activities  Evidence for depression or mood disorders  The patient's weight, height, BMI, and visual acuity have been recorded in the chart. I have made referrals, counseling, and provided education to the patient based on review of the above and I have provided the patient with a written personalized care plan for preventive services.

## 2015-07-17 NOTE — Assessment & Plan Note (Signed)
Overdue for thyroid check she is to reschedule with her endocrinologist but I will go ahead and obtain a thyroid function studies

## 2015-07-17 NOTE — Assessment & Plan Note (Signed)
Reviewed cardiology noted no chest pain or shortness of breath overall doing well

## 2015-07-17 NOTE — Patient Instructions (Addendum)
Try the losartan HCTZ once a day  Stop the diovan  Bone Density to be scheduled  We will call with lab results  F/U 4 months

## 2015-07-17 NOTE — Assessment & Plan Note (Signed)
Blood pressure looks okay despite her stopping the Diovan last week. Her home readings are pretty good for her age. I'm getting changes through losartan HCTZ would try a 30 day trial of this to make sure she does not have any headaches.

## 2015-07-18 ENCOUNTER — Other Ambulatory Visit: Payer: Self-pay

## 2015-07-18 ENCOUNTER — Telehealth: Payer: Self-pay

## 2015-07-18 LAB — TSH: TSH: 0.03 mIU/L — ABNORMAL LOW

## 2015-07-18 LAB — T3, FREE: T3 FREE: 2.7 pg/mL (ref 2.3–4.2)

## 2015-07-18 LAB — T4, FREE: FREE T4: 1.6 ng/dL (ref 0.8–1.8)

## 2015-07-18 MED ORDER — LEVOTHYROXINE SODIUM 125 MCG PO TABS: 125.0000 ug | ORAL_TABLET | Freq: Every day | ORAL | Status: DC

## 2015-07-18 NOTE — Telephone Encounter (Signed)
Called patient. No answer on cell phone or house phone. Patient new prescription sent into pharmacy. Notified MD.

## 2015-07-27 ENCOUNTER — Other Ambulatory Visit: Payer: Self-pay | Admitting: *Deleted

## 2015-07-27 MED ORDER — AMLODIPINE BESYLATE 10 MG PO TABS: 10.0000 mg | ORAL_TABLET | Freq: Every day | ORAL | Status: DC

## 2015-07-27 NOTE — Telephone Encounter (Signed)
Received call from patient daughter, [name].   Requested refill on Norvasc.   Prescription sent to pharmacy.

## 2015-08-01 ENCOUNTER — Other Ambulatory Visit (HOSPITAL_COMMUNITY): Payer: Medicare PPO

## 2015-08-17 ENCOUNTER — Other Ambulatory Visit: Payer: Self-pay | Admitting: *Deleted

## 2015-08-17 ENCOUNTER — Telehealth: Payer: Self-pay | Admitting: Family Medicine

## 2015-08-17 MED ORDER — LOSARTAN POTASSIUM-HCTZ 50-12.5 MG PO TABS: 1.0000 | ORAL_TABLET | Freq: Every day | ORAL | 0 refills | Status: DC

## 2015-08-17 MED ORDER — LEVOTHYROXINE SODIUM 125 MCG PO TABS: 125.0000 ug | ORAL_TABLET | Freq: Every day | ORAL | 3 refills | Status: DC

## 2015-08-17 NOTE — Telephone Encounter (Signed)
Received call from patient daughter, [name]. She states patient has called pharmacy requesting refill on her losartan-hydrochlorothiazide (HYZAAR) 50-12.5 MG tablet [id number]    CB# [phone]

## 2015-08-17 NOTE — Telephone Encounter (Signed)
Prescription sent to pharmacy.

## 2015-08-17 NOTE — Telephone Encounter (Signed)
Received fax requesting refill on Losartan/ HCTZ.   Refill appropriate and filled per protocol. 

## 2015-08-22 ENCOUNTER — Other Ambulatory Visit: Payer: Self-pay | Admitting: *Deleted

## 2015-08-22 MED ORDER — CLOPIDOGREL BISULFATE 75 MG PO TABS: 75.0000 mg | ORAL_TABLET | Freq: Every day | ORAL | 3 refills | Status: DC

## 2015-08-22 NOTE — Telephone Encounter (Signed)
Received fax requesting refill on Plavix.   Refill appropriate and filled per protocol.

## 2015-08-27 ENCOUNTER — Other Ambulatory Visit: Payer: Self-pay | Admitting: *Deleted

## 2015-08-27 MED ORDER — ROSUVASTATIN CALCIUM 40 MG PO TABS: 40.0000 mg | ORAL_TABLET | Freq: Every day | ORAL | 2 refills | Status: DC

## 2015-08-27 MED ORDER — AMLODIPINE BESYLATE 10 MG PO TABS: 10.0000 mg | ORAL_TABLET | Freq: Every day | ORAL | 2 refills | Status: DC

## 2015-08-27 MED ORDER — LOSARTAN POTASSIUM-HCTZ 50-12.5 MG PO TABS: 1.0000 | ORAL_TABLET | Freq: Every day | ORAL | 2 refills | Status: DC

## 2015-08-27 NOTE — Telephone Encounter (Signed)
Received fax requesting refill on routine meds with 90 day supply.   Refill appropriate and filled per protocol.

## 2015-09-13 ENCOUNTER — Ambulatory Visit (HOSPITAL_COMMUNITY)
Admission: RE | Admit: 2015-09-13 | Discharge: 2015-09-13 | Disposition: A | Discharge status: Home / Self Care | Payer: Medicare PPO | Source: Ambulatory Visit | Attending: Family Medicine | Admitting: Family Medicine

## 2015-09-13 DIAGNOSIS — Z13820 Encounter for screening for osteoporosis: Secondary | ICD-10-CM | POA: Insufficient documentation

## 2015-09-13 DIAGNOSIS — M8589 Other specified disorders of bone density and structure, multiple sites: Secondary | ICD-10-CM | POA: Diagnosis not present

## 2015-09-13 DIAGNOSIS — M85832 Other specified disorders of bone density and structure, left forearm: Secondary | ICD-10-CM | POA: Diagnosis not present

## 2015-09-13 DIAGNOSIS — M85862 Other specified disorders of bone density and structure, left lower leg: Secondary | ICD-10-CM | POA: Diagnosis not present

## 2015-10-24 ENCOUNTER — Telehealth: Payer: Self-pay | Admitting: *Deleted

## 2015-10-24 MED ORDER — HYDROCODONE-ACETAMINOPHEN 5-325 MG PO TABS: 1.0000 | ORAL_TABLET | Freq: Two times a day (BID) | ORAL | 0 refills | Status: DC | PRN

## 2015-10-24 MED ORDER — CARVEDILOL 12.5 MG PO TABS: 18.7500 mg | ORAL_TABLET | Freq: Two times a day (BID) | ORAL | 3 refills | Status: DC

## 2015-10-24 NOTE — Telephone Encounter (Signed)
Of note, incorrect prescription printed.   Patient did not pick up Hydrocodone

## 2015-10-24 NOTE — Telephone Encounter (Signed)
Per office note June 2017 patient was on Coreg 12.5 mg one and a half tablets twice daily.

## 2015-10-24 NOTE — Telephone Encounter (Signed)
Received fax from pharmacy.   Reports that patient is requesting refill on Coreg. State that Coreg has not been filled since 12/2014 and was noted at 12.5mg  (1 1/2) tabs PO BID.   Requesting MD to advise if patient should still be taking medication, and if dosage has changed.   I do not see and recent notes regarding Coreg.   Please advise.

## 2015-10-24 NOTE — Telephone Encounter (Signed)
Prescription sent to pharmacy.

## 2015-10-24 NOTE — Addendum Note (Signed)
Addended by: Sheral Flow on: 10/24/2015 04:35 PM   Modules accepted: Orders

## 2015-11-20 ENCOUNTER — Encounter: Payer: Self-pay | Admitting: Family Medicine

## 2015-11-20 ENCOUNTER — Ambulatory Visit (INDEPENDENT_AMBULATORY_CARE_PROVIDER_SITE_OTHER): Payer: Medicare PPO | Admitting: Family Medicine

## 2015-11-20 VITALS — BP 140/78 | HR 68 | Temp 98.7°F | Resp 14 | Ht 65.0 in | Wt 227.0 lb

## 2015-11-20 DIAGNOSIS — E039 Hypothyroidism, unspecified: Secondary | ICD-10-CM | POA: Diagnosis not present

## 2015-11-20 DIAGNOSIS — Z23 Encounter for immunization: Secondary | ICD-10-CM

## 2015-11-20 DIAGNOSIS — Z6837 Body mass index (BMI) 37.0-37.9, adult: Secondary | ICD-10-CM | POA: Diagnosis not present

## 2015-11-20 DIAGNOSIS — R7303 Prediabetes: Secondary | ICD-10-CM | POA: Diagnosis not present

## 2015-11-20 DIAGNOSIS — E6609 Other obesity due to excess calories: Secondary | ICD-10-CM | POA: Diagnosis not present

## 2015-11-20 DIAGNOSIS — I10 Essential (primary) hypertension: Secondary | ICD-10-CM | POA: Diagnosis not present

## 2015-11-20 DIAGNOSIS — IMO0001 Reserved for inherently not codable concepts without codable children: Secondary | ICD-10-CM

## 2015-11-20 DIAGNOSIS — M1611 Unilateral primary osteoarthritis, right hip: Secondary | ICD-10-CM

## 2015-11-20 LAB — CBC WITH DIFFERENTIAL/PLATELET
BASOS PCT: 0 %
Basophils Absolute: 0 cells/uL (ref 0–200)
Eosinophils Absolute: 375 cells/uL (ref 15–500)
Eosinophils Relative: 5 %
HEMATOCRIT: 37 % (ref 35.0–45.0)
HEMOGLOBIN: 12 g/dL (ref 12.0–15.0)
LYMPHS ABS: 1725 {cells}/uL (ref 850–3900)
Lymphocytes Relative: 23 %
MCH: 25.6 pg — ABNORMAL LOW (ref 27.0–33.0)
MCHC: 32.4 g/dL (ref 32.0–36.0)
MCV: 79.1 fL — ABNORMAL LOW (ref 80.0–100.0)
MONO ABS: 675 {cells}/uL (ref 200–950)
MPV: 10.5 fL (ref 7.5–12.5)
Monocytes Relative: 9 %
Neutro Abs: 4725 cells/uL (ref 1500–7800)
Neutrophils Relative %: 63 %
Platelets: 161 10*3/uL (ref 140–400)
RBC: 4.68 MIL/uL (ref 3.80–5.10)
RDW: 15.7 % — ABNORMAL HIGH (ref 11.0–15.0)
WBC: 7.5 10*3/uL (ref 3.8–10.8)

## 2015-11-20 LAB — COMPREHENSIVE METABOLIC PANEL
ALBUMIN: 4.3 g/dL (ref 3.6–5.1)
ALK PHOS: 88 U/L (ref 33–130)
ALT: 17 U/L (ref 6–29)
AST: 23 U/L (ref 10–35)
BILIRUBIN TOTAL: 0.5 mg/dL (ref 0.2–1.2)
BUN: 27 mg/dL — ABNORMAL HIGH (ref 7–25)
CALCIUM: 9.9 mg/dL (ref 8.6–10.4)
CO2: 23 mmol/L (ref 20–31)
Chloride: 104 mmol/L (ref 98–110)
Creat: 1.2 mg/dL — ABNORMAL HIGH (ref 0.60–0.93)
Glucose, Bld: 95 mg/dL (ref 70–99)
Potassium: 4.1 mmol/L (ref 3.5–5.3)
Sodium: 140 mmol/L (ref 135–146)
Total Protein: 7.7 g/dL (ref 6.1–8.1)

## 2015-11-20 LAB — T3, FREE: T3, Free: 2.3 pg/mL (ref 2.3–4.2)

## 2015-11-20 LAB — T4, FREE: Free T4: 1.3 ng/dL (ref 0.8–1.8)

## 2015-11-20 LAB — TSH: TSH: 0.12 mIU/L — ABNORMAL LOW

## 2015-11-20 MED ORDER — HYDROCODONE-ACETAMINOPHEN 5-325 MG PO TABS: 1.0000 | ORAL_TABLET | Freq: Two times a day (BID) | ORAL | 0 refills | Status: DC | PRN

## 2015-11-20 NOTE — Assessment & Plan Note (Signed)
osteoarthritis I refilled her hydrocodone which she uses very sparingly

## 2015-11-20 NOTE — Assessment & Plan Note (Signed)
Recheck thyroid function studies discussed following up with endocrinology

## 2015-11-20 NOTE — Patient Instructions (Signed)
Flu shot given  F/U 4 months  

## 2015-11-20 NOTE — Assessment & Plan Note (Signed)
Blood pressure looks okay for her age and her change her medications

## 2015-11-20 NOTE — Progress Notes (Signed)
   Subjective:    Patient ID: Robin [name], female    DOB: [birth year], 75 y.o.   MRN: [id number]  Patient presents for 4 month F/U (is fasting) Patient here for follow-up on chronic medical problems. The last visit we stopped her Diovan as it was causing headaches. She was placed on losartan HCTZ instead, she is also on Coreg, norvasc  and Crestor for her cholesterol  She is borderline diabetic A1c is 6.2% she is trying to watch her sweets  She is supposed to follow with endocrinology for her thyroid disorder her TSH result 0.03 at the last visit their recommendation was to decrease her to 125 g daily and to have follow-up in office but this was not done. We'll recheck her thyroid levels today   Review Of Systems:  GEN- denies fatigue, fever, weight loss,weakness, recent illness HEENT- denies eye drainage, change in vision, nasal discharge, CVS- denies chest pain, palpitations RESP- denies SOB, cough, wheeze ABD- denies N/V, change in stools, abd pain GU- denies dysuria, hematuria, dribbling, incontinence MSK- + joint pain, muscle aches, injury Neuro- denies headache, dizziness, syncope, seizure activity       Objective:    BP 140/78 (BP Location: Right Arm, Patient Position: Sitting, Cuff Size: Large)   Pulse 68   Temp 98.7 F (37.1 C) (Oral)   Resp 14   Ht 5\' 5"  (1.651 m)   Wt 227 lb (103 kg)   SpO2 98%   BMI 37.77 kg/m  GEN- NAD, alert and oriented x3,obese HEENT- PERRL, EOMI, non injected sclera, pink conjunctiva, MMM, oropharynx clear Neck- Supple, no thryomegaly CVS- RRR, 2/6 SEM  RESP-CTAB ABD-NABS,soft,NT,ND  EXT- No edema LE  , lymphedema LUE Pulses- Radial, DP- 2+        Assessment & Plan:      Problem List Items Addressed This Visit    Prediabetes    She's not gain any weight she is watching her sweets and her carb intake      Obesity   OA (osteoarthritis) of hip    osteoarthritis I refilled her hydrocodone which she uses very sparingly      Relevant Medications   HYDROcodone-acetaminophen (NORCO/VICODIN) 5-325 MG tablet   Hypothyroidism - Primary    Recheck thyroid function studies discussed following up with endocrinology      Relevant Orders   TSH   T3, free   T4, free   Essential hypertension, benign    Blood pressure looks okay for her age and her change her medications      Relevant Orders   CBC with Differential/Platelet   Comprehensive metabolic panel    Other Visit Diagnoses    Need for prophylactic vaccination and inoculation against influenza       Relevant Orders   Flu Vaccine QUAD 36+ mos PF IM (Fluarix & Fluzone Quad PF) (Completed)      Note: This dictation was prepared with Dragon dictation along with smaller phrase technology. Any transcriptional errors that result from this process are unintentional.

## 2015-11-20 NOTE — Assessment & Plan Note (Signed)
She's not gain any weight she is watching her sweets and her carb intake

## 2015-11-22 ENCOUNTER — Other Ambulatory Visit: Payer: Self-pay | Admitting: *Deleted

## 2015-11-22 DIAGNOSIS — E039 Hypothyroidism, unspecified: Secondary | ICD-10-CM

## 2015-11-22 MED ORDER — LEVOTHYROXINE SODIUM 137 MCG PO TABS: 137.0000 ug | ORAL_TABLET | Freq: Every day | ORAL | 1 refills | Status: DC

## 2015-12-31 ENCOUNTER — Other Ambulatory Visit: Payer: Self-pay | Admitting: Family Medicine

## 2016-01-15 ENCOUNTER — Ambulatory Visit (INDEPENDENT_AMBULATORY_CARE_PROVIDER_SITE_OTHER): Payer: Medicare PPO | Admitting: Cardiology

## 2016-01-15 ENCOUNTER — Encounter: Payer: Self-pay | Admitting: Cardiology

## 2016-01-15 VITALS — BP 174/79 | HR 60 | Ht 66.0 in | Wt 229.0 lb

## 2016-01-15 DIAGNOSIS — I10 Essential (primary) hypertension: Secondary | ICD-10-CM | POA: Diagnosis not present

## 2016-01-15 DIAGNOSIS — Z8679 Personal history of other diseases of the circulatory system: Secondary | ICD-10-CM

## 2016-01-15 DIAGNOSIS — I2510 Atherosclerotic heart disease of native coronary artery without angina pectoris: Secondary | ICD-10-CM

## 2016-01-15 NOTE — Patient Instructions (Signed)

## 2016-01-15 NOTE — Progress Notes (Signed)
Cardiology Office Note  Date: 01/15/2016   ID: Robin [name], DOB April 02, 1940, MRN [id number]  PCP: Vic Blackbird, MD  Primary Cardiologist: Rozann Lesches, MD   Chief Complaint  Patient presents with  . Coronary Artery Disease  . History of cardiomyopathy    History of Present Illness: Robin [name] is a 75 y.o. female last seen in June. She is here today for a follow-up visit with family member. Overall states that she has been doing well. She has had good control of her leg edema, no progressive shortness of breath or chest pain.  I reviewed her medications which are outlined below. She continues on aspirin, Norvasc, Coreg, Plavix, Hyzaar, and Crestor. She has not required any nitroglycerin use.  She continues to follow with Dr. Buelah Manis for primary care.  Echocardiogram from April of this year showed normal LVEF.  Past Medical History:  Diagnosis Date  . Breast cancer ([place])    Tamoxifen started 2007  . CAD (coronary artery disease)    DES second diagonal 2/12  . Cardiomyopathy    Probable Takotsubo, LVEF 30-35% 2/12  . Carotid artery disease ([place])   . Diabetes mellitus   . Hepatic steatosis   . HTN (hypertension)   . Hyperlipidemia   . Hypothyroidism   . NSTEMI (non-ST elevated myocardial infarction) ([place])    2/12  . Pancreas divisum   . Stroke [facility])    (2000) residual left-sided weakness    Current Outpatient Prescriptions  Medication Sig Dispense Refill  . amLODipine (NORVASC) 10 MG tablet Take 1 tablet (10 mg total) by mouth daily. 90 tablet 2  . aspirin 81 MG tablet Take 81 mg by mouth daily.      . carvedilol (COREG) 12.5 MG tablet Take 1.5 tablets (18.75 mg total) by mouth 2 (two) times daily. 270 tablet 3  . clopidogrel (PLAVIX) 75 MG tablet Take 1 tablet (75 mg total) by mouth daily. 90 tablet 3  . docusate sodium (COLACE) 100 MG capsule Take 100 mg by mouth as needed for mild constipation.    Marland Kitchen HYDROcodone-acetaminophen (NORCO/VICODIN) 5-325  MG tablet Take 1 tablet by mouth 2 (two) times daily as needed for moderate pain. 45 tablet 0  . losartan-hydrochlorothiazide (HYZAAR) 50-12.5 MG tablet Take 1 tablet by mouth daily. 90 tablet 2  . nitroGLYCERIN (NITROSTAT) 0.4 MG SL tablet Place 1 tablet (0.4 mg total) under the tongue every 5 (five) minutes x 3 doses as needed. 25 tablet 3  . Omega-3 Fatty Acids 300 MG CAPS Take 1 capsule (300 mg total) by mouth 2 (two) times daily.    . rosuvastatin (CRESTOR) 40 MG tablet Take 1 tablet (40 mg total) by mouth daily. 90 tablet 2  . SYNTHROID 137 MCG tablet TAKE 1 TABLET BY MOUTH DAILY BEFORE BREAKFAST. 30 tablet 1   No current facility-administered medications for this visit.    Allergies:  Codeine   Social History: The patient  reports that she has never smoked. She has never used smokeless tobacco. She reports that she does not drink alcohol or use drugs.   ROS:  Please see the history of present illness. Otherwise, complete review of systems is positive for none.  All other systems are reviewed and negative.   Physical Exam: VS:  BP (!) 174/79   Pulse 60   Ht 5\' 6"  (1.676 m)   Wt 229 lb (103.9 kg)   SpO2 99%   BMI 36.96 kg/m , BMI Body mass index is 36.96  kg/m.  Wt Readings from Last 3 Encounters:  01/15/16 229 lb (103.9 kg)  11/20/15 227 lb (103 kg)  07/17/15 226 lb (102.5 kg)    Obese woman in no acute distress.  HEENT: Conjunctiva and lids normal, oropharynx clear with moist mucosa.  Neck: Supple, no elevated JVP or carotid bruits, no thyromegaly.  Lungs: Clear to auscultation, nonlabored breathing at rest.  Cardiac: Regular rate and rhythm, no S3, 2/6 systolic murmur, no pericardial rub.  Abdomen: Soft, nontender, no hepatomegaly, bowel sounds present, no guarding or rebound.  Extremities: 1+ ankle edema bilaterally.   ECG: I personally reviewed the tracing from 04/26/2015 which showed sinus rhythm with increased voltage.  Recent Labwork: 11/20/2015: ALT 17; AST  23; BUN 27; Creat 1.20; Hemoglobin 12.0; Platelets 161; Potassium 4.1; Sodium 140; TSH 0.12     Component Value Date/Time   CHOL 132 07/17/2015 0841   TRIG 103 07/17/2015 0841   HDL 43 (L) 07/17/2015 0841   CHOLHDL 3.1 07/17/2015 0841   VLDL 21 07/17/2015 0841   LDLCALC 68 07/17/2015 0841    Other Studies Reviewed Today:  Echocardiogram 04/27/2015: Study Conclusions  - Left ventricle: The cavity size was normal. Wall thickness was  increased in a pattern of severe LVH. Systolic function was  normal. The estimated ejection fraction was in the range of 60%  to 65%. Wall motion was normal; there were no regional wall  motion abnormalities. Doppler parameters are consistent with  abnormal left ventricular relaxation (grade 1 diastolic  dysfunction). Doppler parameters are consistent with high  ventricular filling pressure. - Aortic valve: Mildly calcified annulus. Trileaflet; mildly  calcified leaflets. - Mitral valve: Calcified annulus. There was trivial regurgitation. - Left atrium: The atrium was mildly dilated. - Right atrium: Central venous pressure (est): 3 mm Hg. - Tricuspid valve: There was trivial regurgitation. - Pulmonary arteries: PA peak pressure: 26 mm Hg (S). - Pericardium, extracardiac: There was no pericardial effusion.  Impressions:  - Severe LVH with LVEF 60-65%. Grade 1 diastolic dysfunction with  increased LV filling pressures. Mild left atrial enlargement. MAC  with trivial mitral regurgitation. Mildly sclerotic aortic valve.  Trivial tricuspid regurgitation with PASP 26 mmHg mmHg.  Lexiscan Cardiolite April 2013: Inferolateral scar with mild peri-infarct ischemia, LVEF 61%.  Assessment and Plan:  1. CAD status post DES to the second diagonal in 2012. She does not report any angina symptoms. Continue medical therapy and observation for now.  2. History of Tako-tsubo cardiomyopathy with normalization of LVEF. Echocardiogram from April is  outlined above.  3. Essential hypertension, blood pressure elevated today. She reports compliance with her medications. Blood pressure better controlled at last visit with Dr. Buelah Manis. Continue to follow.  Current medicines were reviewed with the patient today.  Disposition: Follow-up in 6 months.  Signed, Satira Sark, MD, [facility] 01/15/2016 12:08 PM    [facility] [address], [place], [place] [address] Phone: [phone]; Fax: [phone]

## 2016-03-07 ENCOUNTER — Other Ambulatory Visit: Payer: Self-pay | Admitting: Family Medicine

## 2016-03-19 ENCOUNTER — Ambulatory Visit (INDEPENDENT_AMBULATORY_CARE_PROVIDER_SITE_OTHER): Payer: Medicare PPO | Admitting: Family Medicine

## 2016-03-19 ENCOUNTER — Encounter: Payer: Self-pay | Admitting: Family Medicine

## 2016-03-19 VITALS — BP 144/78 | HR 70 | Temp 98.1°F | Resp 16 | Ht 66.0 in | Wt 227.0 lb

## 2016-03-19 DIAGNOSIS — E782 Mixed hyperlipidemia: Secondary | ICD-10-CM | POA: Diagnosis not present

## 2016-03-19 DIAGNOSIS — E039 Hypothyroidism, unspecified: Secondary | ICD-10-CM

## 2016-03-19 DIAGNOSIS — I2510 Atherosclerotic heart disease of native coronary artery without angina pectoris: Secondary | ICD-10-CM

## 2016-03-19 DIAGNOSIS — R7303 Prediabetes: Secondary | ICD-10-CM

## 2016-03-19 DIAGNOSIS — I10 Essential (primary) hypertension: Secondary | ICD-10-CM

## 2016-03-19 MED ORDER — HYDROCODONE-ACETAMINOPHEN 5-325 MG PO TABS: 1.0000 | ORAL_TABLET | Freq: Two times a day (BID) | ORAL | 0 refills | Status: DC | PRN

## 2016-03-19 NOTE — Patient Instructions (Addendum)
F/U 4 months for Physical  We will call with lab results  No changes to medications today

## 2016-03-19 NOTE — Assessment & Plan Note (Signed)
Check A1C, weight down 2lbs

## 2016-03-19 NOTE — Assessment & Plan Note (Signed)
Blood  pressures always a little elevated for her age in the 54s I think that this is reasonable. No change in her medication

## 2016-03-19 NOTE — Assessment & Plan Note (Signed)
Currently without symptoms. She is asked to done quite well these past 6 months or so. She is without chest pain shortness of breath. I will check her lipid panel continue her current medications. Also check her thyroid function panel she has not been able to follow-up with her endocrinologist.

## 2016-03-19 NOTE — Progress Notes (Signed)
   Subjective:    Patient ID: Robin [name], female    DOB: January 10, 1941, 76 y.o.   MRN: [id number]  Patient presents for 4 month F/U (is fasting)     Patient here to follow-up chronic medical problems. Her medications are reviewed. She still followed by cardiology  No concerns today  Is due for fasting labs   Blood pressure she is taking her medications as prescribed she denies any dizziness or chest pain. She is on Crestor for cholesterol and coronary artery disease/carotid artery disease  Is also maintained on Plavix   She is also being followed for borderline diabetes mellitus last A1C 8 months ago 6.2%   Review Of Systems:  GEN- denies fatigue, fever, weight loss,weakness, recent illness HEENT- denies eye drainage, change in vision, nasal discharge, CVS- denies chest pain, palpitations RESP- denies SOB, cough, wheeze ABD- denies N/V, change in stools, abd pain GU- denies dysuria, hematuria, dribbling, incontinence MSK- denies joint pain, muscle aches, injury Neuro- denies headache, dizziness, syncope, seizure activity       Objective:    BP (!) 144/78   Pulse 70   Temp 98.1 F (36.7 C) (Oral)   Resp 16   Ht 5\' 6"  (1.676 m)   Wt 227 lb (103 kg)   SpO2 98%   BMI 36.64 kg/m  GEN- NAD, alert and oriented x3,obese HEENT- PERRL, EOMI, non injected sclera, pink conjunctiva, MMM, oropharynx clear Neck- Supple, no thryomegaly CVS- RRR, 2/6 SEM  RESP-CTAB ABD-NABS,soft,NT,ND  EXT- No edema LE  , lymphedema LUE Pulses- Radial, DP- 2+        Assessment & Plan:      Problem List Items Addressed This Visit    Prediabetes - Primary    Check A1C, weight down 2lbs      Relevant Orders   Hemoglobin A1c   Mixed hyperlipidemia   Relevant Orders   Lipid panel   Hypothyroidism   Essential hypertension, benign    Blood  pressures always a little elevated for her age in the 60s I think that this is reasonable. No change in her medication      Relevant Orders   CBC with Differential/Platelet   Comprehensive metabolic panel   Coronary atherosclerosis of native coronary artery    Currently without symptoms. She is asked to done quite well these past 6 months or so. She is without chest pain shortness of breath. I will check her lipid panel continue her current medications. Also check her thyroid function panel she has not been able to follow-up with her endocrinologist.         Note: This dictation was prepared with Dragon dictation along with smaller phrase technology. Any transcriptional errors that result from this process are unintentional.

## 2016-04-08 DIAGNOSIS — E782 Mixed hyperlipidemia: Secondary | ICD-10-CM | POA: Diagnosis not present

## 2016-04-08 DIAGNOSIS — I10 Essential (primary) hypertension: Secondary | ICD-10-CM | POA: Diagnosis not present

## 2016-04-08 DIAGNOSIS — E039 Hypothyroidism, unspecified: Secondary | ICD-10-CM | POA: Diagnosis not present

## 2016-04-08 DIAGNOSIS — R7303 Prediabetes: Secondary | ICD-10-CM | POA: Diagnosis not present

## 2016-04-08 LAB — TSH: TSH: 0.01 m[IU]/L — AB

## 2016-04-08 LAB — CBC WITH DIFFERENTIAL/PLATELET
BASOS ABS: 0 {cells}/uL (ref 0–200)
Basophils Relative: 0 %
Eosinophils Absolute: 345 cells/uL (ref 15–500)
Eosinophils Relative: 5 %
HCT: 37.1 % (ref 35.0–45.0)
Hemoglobin: 11.8 g/dL — ABNORMAL LOW (ref 12.0–15.0)
Lymphocytes Relative: 22 %
Lymphs Abs: 1518 cells/uL (ref 850–3900)
MCH: 25.2 pg — AB (ref 27.0–33.0)
MCHC: 31.8 g/dL — ABNORMAL LOW (ref 32.0–36.0)
MCV: 79.1 fL — ABNORMAL LOW (ref 80.0–100.0)
MPV: 10 fL (ref 7.5–12.5)
Monocytes Absolute: 552 cells/uL (ref 200–950)
Monocytes Relative: 8 %
NEUTROS ABS: 4485 {cells}/uL (ref 1500–7800)
Neutrophils Relative %: 65 %
PLATELETS: 176 10*3/uL (ref 140–400)
RBC: 4.69 MIL/uL (ref 3.80–5.10)
RDW: 15.6 % — ABNORMAL HIGH (ref 11.0–15.0)
WBC: 6.9 10*3/uL (ref 3.8–10.8)

## 2016-04-08 LAB — COMPREHENSIVE METABOLIC PANEL
ALT: 14 U/L (ref 6–29)
AST: 20 U/L (ref 10–35)
Albumin: 4.2 g/dL (ref 3.6–5.1)
Alkaline Phosphatase: 92 U/L (ref 33–130)
BUN: 22 mg/dL (ref 7–25)
CHLORIDE: 105 mmol/L (ref 98–110)
CO2: 23 mmol/L (ref 20–31)
CREATININE: 1.05 mg/dL — AB (ref 0.60–0.93)
Calcium: 9.9 mg/dL (ref 8.6–10.4)
GLUCOSE: 108 mg/dL — AB (ref 70–99)
Potassium: 3.9 mmol/L (ref 3.5–5.3)
SODIUM: 140 mmol/L (ref 135–146)
TOTAL PROTEIN: 7.4 g/dL (ref 6.1–8.1)
Total Bilirubin: 0.5 mg/dL (ref 0.2–1.2)

## 2016-04-08 LAB — LIPID PANEL
CHOL/HDL RATIO: 2.9 ratio (ref <5.0)
Cholesterol: 140 mg/dL (ref <200)
HDL: 48 mg/dL — AB (ref >50)
LDL CALC: 72 mg/dL (ref <100)
Triglycerides: 98 mg/dL (ref <150)
VLDL: 20 mg/dL (ref <30)

## 2016-04-08 LAB — T3, FREE: T3, Free: 3.2 pg/mL (ref 2.3–4.2)

## 2016-04-08 LAB — T4, FREE: Free T4: 1.3 ng/dL (ref 0.8–1.8)

## 2016-04-09 ENCOUNTER — Other Ambulatory Visit: Payer: Self-pay | Admitting: *Deleted

## 2016-04-09 LAB — HEMOGLOBIN A1C
Hgb A1c MFr Bld: 5.6 % (ref <5.7)
Mean Plasma Glucose: 114 mg/dL

## 2016-04-09 MED ORDER — LEVOTHYROXINE SODIUM 125 MCG PO TABS: 125.0000 ug | ORAL_TABLET | Freq: Every day | ORAL | 3 refills | Status: DC

## 2016-05-04 ENCOUNTER — Encounter (HOSPITAL_COMMUNITY): Payer: Self-pay | Admitting: Emergency Medicine

## 2016-05-04 ENCOUNTER — Emergency Department (HOSPITAL_COMMUNITY)
Admission: EM | Admit: 2016-05-04 | Discharge: 2016-05-04 | Disposition: A | Discharge status: Home / Self Care | Payer: Medicare PPO | Attending: Emergency Medicine | Admitting: Emergency Medicine

## 2016-05-04 ENCOUNTER — Emergency Department (HOSPITAL_COMMUNITY): Payer: Medicare PPO

## 2016-05-04 DIAGNOSIS — M5136 Other intervertebral disc degeneration, lumbar region: Secondary | ICD-10-CM | POA: Diagnosis not present

## 2016-05-04 DIAGNOSIS — Z79899 Other long term (current) drug therapy: Secondary | ICD-10-CM | POA: Insufficient documentation

## 2016-05-04 DIAGNOSIS — E119 Type 2 diabetes mellitus without complications: Secondary | ICD-10-CM | POA: Insufficient documentation

## 2016-05-04 DIAGNOSIS — Z853 Personal history of malignant neoplasm of breast: Secondary | ICD-10-CM | POA: Insufficient documentation

## 2016-05-04 DIAGNOSIS — R1032 Left lower quadrant pain: Secondary | ICD-10-CM | POA: Diagnosis not present

## 2016-05-04 DIAGNOSIS — N289 Disorder of kidney and ureter, unspecified: Secondary | ICD-10-CM | POA: Diagnosis not present

## 2016-05-04 DIAGNOSIS — M25562 Pain in left knee: Secondary | ICD-10-CM | POA: Insufficient documentation

## 2016-05-04 DIAGNOSIS — N281 Cyst of kidney, acquired: Secondary | ICD-10-CM

## 2016-05-04 DIAGNOSIS — Z7982 Long term (current) use of aspirin: Secondary | ICD-10-CM | POA: Diagnosis not present

## 2016-05-04 DIAGNOSIS — E039 Hypothyroidism, unspecified: Secondary | ICD-10-CM | POA: Insufficient documentation

## 2016-05-04 DIAGNOSIS — I10 Essential (primary) hypertension: Secondary | ICD-10-CM | POA: Insufficient documentation

## 2016-05-04 DIAGNOSIS — I2510 Atherosclerotic heart disease of native coronary artery without angina pectoris: Secondary | ICD-10-CM | POA: Insufficient documentation

## 2016-05-04 DIAGNOSIS — R1031 Right lower quadrant pain: Secondary | ICD-10-CM | POA: Diagnosis not present

## 2016-05-04 DIAGNOSIS — R109 Unspecified abdominal pain: Secondary | ICD-10-CM

## 2016-05-04 LAB — URINALYSIS, ROUTINE W REFLEX MICROSCOPIC
Bilirubin Urine: NEGATIVE
GLUCOSE, UA: NEGATIVE mg/dL
Ketones, ur: NEGATIVE mg/dL
Leukocytes, UA: NEGATIVE
Nitrite: NEGATIVE
PH: 5 (ref 5.0–8.0)
PROTEIN: NEGATIVE mg/dL
SPECIFIC GRAVITY, URINE: 1.015 (ref 1.005–1.030)

## 2016-05-04 MED ORDER — ONDANSETRON HCL 4 MG PO TABS
4.0000 mg | ORAL_TABLET | Freq: Once | ORAL | Status: AC
  Administered 2016-05-04: 4 mg via ORAL
  Filled 2016-05-04: qty 1

## 2016-05-04 MED ORDER — OXYCODONE-ACETAMINOPHEN 5-325 MG PO TABS: 1.0000 | ORAL_TABLET | Freq: Four times a day (QID) | ORAL | 0 refills | Status: DC | PRN

## 2016-05-04 MED ORDER — OXYCODONE-ACETAMINOPHEN 5-325 MG PO TABS
1.0000 | ORAL_TABLET | Freq: Once | ORAL | Status: AC
  Administered 2016-05-04: 1 via ORAL
  Filled 2016-05-04: qty 1

## 2016-05-04 NOTE — ED Provider Notes (Signed)
[place] DEPT Provider Note   CSN: [id number] Arrival date & time: 05/04/16  1007  By signing my name below, I, Margit Banda, attest that this documentation has been prepared under the direction and in the presence of [employer] .  Electronically Signed: Margit Banda, ED Scribe. 05/04/16. 12:45 PM.  History   Chief Complaint Chief Complaint  Patient presents with  . Dysuria    HPI Robin [name] is a 76 y.o. female with a PMHx of kidney stones, CAD, HTN, HLD, and DM, who presents to the Emergency Department complaining of painful urination that started on Monday, 04/28/16. Associated sx include lower abdominal pain and more frequent urination. Additionally pt c/o of back and leg pain since Monday as well. She reports being treated for arthritis. However, pain is more intense than normal. Pt notes she uses a heating pad and will take Hydrocodone when pain is unbearable. She states that the hydrocodone doesn't feel like it is doing anything, and only brings her pain down one level on the pain scale. No recent traumas, injuries or falls. Pt denies hematuria, fever, nausea and vomiting.  The history is provided by the patient. No language interpreter was used.  Dysuria   This is a new problem. The current episode started more than 2 days ago. The problem occurs every urination. The problem has not changed since onset.The quality of the pain is described as burning. The pain is moderate. There has been no fever. Associated symptoms include frequency. Pertinent negatives include no nausea, no vomiting and no hematuria. Her past medical history is significant for kidney stones.    Past Medical History:  Diagnosis Date  . Breast cancer ([place])    Tamoxifen started 2007  . CAD (coronary artery disease)    DES second diagonal 2/12  . Cardiomyopathy    Probable Takotsubo, LVEF 30-35% 2/12  . Carotid artery disease ([place])   . Diabetes mellitus   . Hepatic steatosis   . HTN  (hypertension)   . Hyperlipidemia   . Hypothyroidism   . NSTEMI (non-ST elevated myocardial infarction) ([place])    2/12  . Pancreas divisum   . Stroke [facility])    (2000) residual left-sided weakness    Patient Active Problem List   Diagnosis Date Noted  . Acute onset of severe vertigo 04/26/2015  . Prediabetes 07/19/2012  . Tinnitus 12/04/2011  . Leg pain 09/19/2011  . Hypothyroidism 09/19/2011  . OA (osteoarthritis) of hip 09/19/2011  . Obesity 09/19/2011  . Nail problem 05/13/2011  . Dizzy spells 05/13/2011  . Paresthesia 05/13/2011  . Carotid artery disease ([place]) 04/24/2011  . Edema 09/17/2010  . Secondary cardiomyopathy ([place]) 04/09/2010  . Coronary atherosclerosis of native coronary artery 04/09/2010  . Essential hypertension, benign 04/09/2010  . Mixed hyperlipidemia 04/09/2010    Past Surgical History:  Procedure Laterality Date  . ABDOMINAL HYSTERECTOMY    . CAROTID ENDARTERECTOMY     Left  . CHOLECYSTECTOMY    . JOINT REPLACEMENT     Right knee [facility]- [facility]  . MASTECTOMY     Bilateral    OB History    Gravida Para Term Preterm AB Living   6 6 6     6    SAB TAB Ectopic Multiple Live Births                   Home Medications    Prior to Admission medications   Medication Sig Start Date End Date Taking? Authorizing Provider  amLODipine (NORVASC) 10 MG tablet Take 1 tablet (10 mg total) by mouth daily. 08/27/15  Yes Alycia Rossetti, MD  aspirin 81 MG tablet Take 81 mg by mouth daily.     Yes Historical Provider, MD  carvedilol (COREG) 12.5 MG tablet Take 1.5 tablets (18.75 mg total) by mouth 2 (two) times daily. 10/24/15  Yes Alycia Rossetti, MD  clopidogrel (PLAVIX) 75 MG tablet Take 1 tablet (75 mg total) by mouth daily. 08/22/15  Yes Alycia Rossetti, MD  docusate sodium (COLACE) 100 MG capsule Take 100 mg by mouth as needed for mild constipation.   Yes Historical Provider, MD  HYDROcodone-acetaminophen (NORCO/VICODIN) 5-325 MG tablet  Take 1 tablet by mouth 2 (two) times daily as needed for moderate pain. 03/19/16  Yes Alycia Rossetti, MD  levothyroxine (SYNTHROID, LEVOTHROID) 125 MCG tablet Take 1 tablet (125 mcg total) by mouth daily. 04/09/16  Yes Alycia Rossetti, MD  losartan-hydrochlorothiazide (HYZAAR) 50-12.5 MG tablet Take 1 tablet by mouth daily. 08/27/15  Yes Alycia Rossetti, MD  nitroGLYCERIN (NITROSTAT) 0.4 MG SL tablet Place 1 tablet (0.4 mg total) under the tongue every 5 (five) minutes x 3 doses as needed. 01/11/14  Yes Satira Sark, MD  Omega-3 Fatty Acids 300 MG CAPS Take 1 capsule (300 mg total) by mouth 2 (two) times daily. 04/24/11  Yes Burna Forts Serpe, PA-C  rosuvastatin (CRESTOR) 40 MG tablet Take 1 tablet (40 mg total) by mouth daily. 08/27/15  Yes Alycia Rossetti, MD    Family History Family History  Problem Relation Age of Onset  . Heart attack Sister   . Heart disease Sister   . Heart attack Brother   . Heart disease Brother     Social History Social History  Substance Use Topics  . Smoking status: Never Smoker  . Smokeless tobacco: Never Used  . Alcohol use No     Allergies   Codeine   Review of Systems Review of Systems  Constitutional: Negative for fever.  Gastrointestinal: Positive for abdominal pain. Negative for nausea and vomiting.  Genitourinary: Positive for dysuria and frequency. Negative for hematuria.  Musculoskeletal: Positive for arthralgias and back pain.  All other systems reviewed and are negative.    Physical Exam Updated Vital Signs BP (!) 179/78 (BP Location: Right Arm)   Pulse 66   Temp 98 F (36.7 C) (Oral)   Resp 18   Ht 5\' 6"  (1.676 m)   Wt 234 lb (106.1 kg)   SpO2 100%   BMI 37.77 kg/m   Physical Exam  Constitutional: She is oriented to person, place, and time. She appears well-developed and well-nourished. No distress.  HENT:  Head: Normocephalic and atraumatic.  Eyes: Conjunctivae are normal.  Neck: Normal range of motion.    Cardiovascular: Normal rate.   Pulmonary/Chest: Effort normal.  Abdominal: Soft. She exhibits no distension and no mass.  R and L lower abdominal pain to palpitation. Suprapubic pain to palpitation.   Musculoskeletal: Normal range of motion.  Degenerative joint disease deformity to right and left knee. No hot joints appreciated. Degenerative changes of the wrist and fingers of the UE. Pain to palpitation to the lumbar region.  No palpable step off. No hot areas appreciated.     Neurological: She is alert and oriented to person, place, and time. No sensory deficit.  No motor deficit appreciated.   Skin: No pallor.  Psychiatric: She has a normal mood and affect. Her behavior is normal.  Nursing  note and vitals reviewed.    ED Treatments / Results  DIAGNOSTIC STUDIES: Oxygen Saturation is 100% on RA, normal by my interpretation.   COORDINATION OF CARE: 11:49 AM-Discussed next steps with pt. Pt verbalized understanding and is agreeable with the plan.    Labs (all labs ordered are listed, but only abnormal results are displayed) Labs Reviewed  URINALYSIS, ROUTINE W REFLEX MICROSCOPIC - Abnormal; Notable for the following:       Result Value   APPearance HAZY (*)    Hgb urine dipstick SMALL (*)    Bacteria, UA RARE (*)    Squamous Epithelial / LPF 0-5 (*)    All other components within normal limits  URINE CULTURE    EKG  EKG Interpretation None       Radiology Dg Knee Complete 4 Views Left  Result Date: 05/04/2016 CLINICAL DATA:  LEFT knee pain.  LEFT knee pain began yesterday. EXAM: LEFT KNEE - COMPLETE 4+ VIEW COMPARISON:  None. FINDINGS: No fracture of the proximal tibia or distal femur. Patella is normal. No joint effusion. Minimal degenerative spurring. IMPRESSION: No fracture or dislocation. Electronically Signed   By: Suzy Bouchard M.D.   On: 05/04/2016 11:47    Procedures Procedures (including critical care time)  Medications Ordered in  ED Medications - No data to display   Initial Impression / Assessment and Plan / ED Course  I have reviewed the triage vital signs and the nursing notes.  Pertinent labs & imaging results that were available during my care of the patient were reviewed by me and considered in my medical decision making (see chart for details).      MDM  11:50 AM H/o degenerative joint disease. The back feels similar to previous arthritis problem. No hot joints appreciated. No h/o of trauma. Suspect exacerbates of degenerative joint disease.   Pt reports pain at the lower abdomen with urination. Has increased urine urgency. No hematoma reported and no fever. H/o of kidney stones. Will assess for UTI and renal calculi. Urinalysis reveals a hazy specimen with a specific gravity 1.015. There is a small hemoglobin present, there is rare bacteria present. Otherwise the urine is within normal limits.  Given the complaint of lower abdomen pain and a history of kidney stones, a CT scan stone study was obtained. There no stones identified. There are 2 nonspecific lesions noted of the left kidney on. There is one of the upper pole that measures 1.2 cm there is a hyperdense lesion at the lower pole that measures 1.3 x 1.1 cm. I've instructed the patient on the findings of the CT scan including the multiple areas of degenerative disc disease in the lumbar region. We also discussed the encouraging findings of arista, findings for metastatic breast cancer as the patient has a history of breast cancer.  The patient is referred to urology for evaluation of the cyst. The patient is to follow the primary physician concerning the dysuria. Patient states that the hydrocodone that she is taking is not effective. The patient was given 12 tablets of Percocet to use until she can see Dr. Buelah Manis for reevaluation and for pain management. Final Clinical Impressions(s) / ED Diagnoses   Final diagnoses:  Acute left flank pain  Renal  cyst, left  DDD (degenerative disc disease), lumbar    New Prescriptions New Prescriptions   No medications on file   **I personally performed the services described in this documentation, which was scribed in my presence. The recorded information has  been reviewed and is accurate.Lily Kocher, PA-C 05/05/16 7062    Fredia Sorrow, MD 05/05/16 1537

## 2016-05-04 NOTE — ED Provider Notes (Signed)
Medical screening examination/treatment/procedure(s) were conducted as a shared visit with non-physician practitioner(s) and myself.  I personally evaluated the patient during the encounter.   EKG Interpretation None       Results for orders placed or performed during the hospital encounter of 05/04/16  Urinalysis, Routine w reflex microscopic  Result Value Ref Range   Color, Urine YELLOW YELLOW   APPearance HAZY (A) CLEAR   Specific Gravity, Urine 1.015 1.005 - 1.030   pH 5.0 5.0 - 8.0   Glucose, UA NEGATIVE NEGATIVE mg/dL   Hgb urine dipstick SMALL (A) NEGATIVE   Bilirubin Urine NEGATIVE NEGATIVE   Ketones, ur NEGATIVE NEGATIVE mg/dL   Protein, ur NEGATIVE NEGATIVE mg/dL   Nitrite NEGATIVE NEGATIVE   Leukocytes, UA NEGATIVE NEGATIVE   RBC / HPF 0-5 0 - 5 RBC/hpf   WBC, UA 0-5 0 - 5 WBC/hpf   Bacteria, UA RARE (A) NONE SEEN   Squamous Epithelial / LPF 0-5 (A) NONE SEEN   Mucous PRESENT    Dg Knee Complete 4 Views Left  Result Date: 05/04/2016 CLINICAL DATA:  LEFT knee pain.  LEFT knee pain began yesterday. EXAM: LEFT KNEE - COMPLETE 4+ VIEW COMPARISON:  None. FINDINGS: No fracture of the proximal tibia or distal femur. Patella is normal. No joint effusion. Minimal degenerative spurring. IMPRESSION: No fracture or dislocation. Electronically Signed   By: Suzy Bouchard M.D.   On: 05/04/2016 11:47   Results for orders placed or performed during the hospital encounter of 05/04/16  Urinalysis, Routine w reflex microscopic  Result Value Ref Range   Color, Urine YELLOW YELLOW   APPearance HAZY (A) CLEAR   Specific Gravity, Urine 1.015 1.005 - 1.030   pH 5.0 5.0 - 8.0   Glucose, UA NEGATIVE NEGATIVE mg/dL   Hgb urine dipstick SMALL (A) NEGATIVE   Bilirubin Urine NEGATIVE NEGATIVE   Ketones, ur NEGATIVE NEGATIVE mg/dL   Protein, ur NEGATIVE NEGATIVE mg/dL   Nitrite NEGATIVE NEGATIVE   Leukocytes, UA NEGATIVE NEGATIVE   RBC / HPF 0-5 0 - 5 RBC/hpf   WBC, UA 0-5 0 - 5 WBC/hpf    Bacteria, UA RARE (A) NONE SEEN   Squamous Epithelial / LPF 0-5 (A) NONE SEEN   Mucous PRESENT      Patient seen by me along with the physician assistant. Patient presents with a three-day complaint of urinary frequency dysuria and some low back pain lower pelvic pressure denies any nausea vomiting or diarrhea or fevers. Patient also has some baseline left knee pain. Which is felt to be due to arthritis.  Urinalysis negative for urinary tract infection. PA has ordered CT renal to rule out any kidney stones as cause of the pelvic pain and dysuria and frequency.  X-rays of the left knee without any acute findings.  If CT renal is negative patient is stable for discharge home and follow-up with primary care provider.  On exam heart is regular lungs are clear bilaterally abdomen is normal bowel sounds and is nontender.     Fredia Sorrow, MD 05/04/16 1451

## 2016-05-04 NOTE — ED Triage Notes (Signed)
Patient c/o dysuria with frequency, low back pain, and lower pelvic pressure x3 days. Denies any nausea, vomiting, diarrhea, or fever. Patient alos c/o left knee and bilateral l;eg pain that increased yesterday. Per patient arthritic pain.

## 2016-05-04 NOTE — Discharge Instructions (Signed)
Your vital signs are within normal limits, except for your blood pressure being slightly elevated. Your urine test is negative for acute problem. Your CT scan reveal degenerative disc disease of the lower back at multiple levels. Your scan also reveals cyst on the left kidney. Please see [facility] for evaluation of the cyst as soon as possible. Discuss the degenerative disc disease with Dr Buelah Manis. Please use tylenol for mild pain. Use percocet for more severe pain. This medication may cause drowsiness,and/or constipation. Use a stool softener while  taking percocet. Please use caution changing positions and walking.

## 2016-05-05 ENCOUNTER — Encounter: Payer: Self-pay | Admitting: Family Medicine

## 2016-05-05 ENCOUNTER — Ambulatory Visit (INDEPENDENT_AMBULATORY_CARE_PROVIDER_SITE_OTHER): Payer: Medicare PPO | Admitting: Family Medicine

## 2016-05-05 VITALS — BP 144/76 | HR 60 | Temp 98.3°F | Resp 16 | Ht 66.0 in | Wt 225.0 lb

## 2016-05-05 DIAGNOSIS — N3001 Acute cystitis with hematuria: Secondary | ICD-10-CM | POA: Diagnosis not present

## 2016-05-05 DIAGNOSIS — N281 Cyst of kidney, acquired: Secondary | ICD-10-CM

## 2016-05-05 DIAGNOSIS — M170 Bilateral primary osteoarthritis of knee: Secondary | ICD-10-CM

## 2016-05-05 DIAGNOSIS — M5136 Other intervertebral disc degeneration, lumbar region: Secondary | ICD-10-CM | POA: Diagnosis not present

## 2016-05-05 MED ORDER — CIPROFLOXACIN HCL 250 MG PO TABS: 250.0000 mg | ORAL_TABLET | Freq: Two times a day (BID) | ORAL | 0 refills | Status: DC

## 2016-05-05 MED ORDER — HYDROCODONE-ACETAMINOPHEN 7.5-325 MG PO TABS: 1.0000 | ORAL_TABLET | Freq: Four times a day (QID) | ORAL | 0 refills | Status: DC | PRN

## 2016-05-05 NOTE — Patient Instructions (Signed)
Take antibiotics for next 3 days Take pain medication Use muscle rub for knees Appointment with urologist  F/U as previous

## 2016-05-05 NOTE — Progress Notes (Signed)
   Subjective:    Patient ID: Robin [name], female    DOB: 09/10/1940, 76 y.o.   MRN: [id number]  Patient presents for ER F/U (L sided abd pain) Pt here for follow-up ER visit. She was seen in the ER secondary to urinary frequency or flank pain. She is CT scan which showed 2 nonspecific cystic lesions that were not noted on prior imaging recommended that CT scan be repeated A urinalysis showed small blood but minimal bacteria. Her CT also showed multilevel degenerative disease in her spine she was given Percocet for pain But this made her sick.  Constipation also noted on scan- she states bowels are good  She continues to have pressure and burning with urination still has some low back pain but mostly in the center. She also continues to have some bilateral knee pain which prompted the x-ray at the emergency room there was no effusion. She feels like her left knee catches at times. She has known history of degenerative disc disease and osteoarthritis. He was seen by orthopedics years back She would like to try a higher dose of the hydrocodone. Urine culture is pending    Review Of Systems:  GEN- denies fatigue, fever, weight loss,weakness, recent illness HEENT- denies eye drainage, change in vision, nasal discharge, CVS- denies chest pain, palpitations RESP- denies SOB, cough, wheeze ABD- denies N/V, change in stools, abd pain GU- denies dysuria, hematuria, dribbling, incontinence MSK-+ joint pain, muscle aches, injury Neuro- denies headache, dizziness, syncope, seizure activity       Objective:    BP (!) 144/76   Pulse 60   Temp 98.3 F (36.8 C) (Oral)   Resp 16   Ht 5\' 6"  (1.676 m)   Wt 225 lb (102.1 kg)   SpO2 98%   BMI 36.32 kg/m  GEN- NAD, alert and oriented x3,obese, weight down 2lbs since Feb, mild antalgic gait  HEENT- PERRL, EOMI, non injected sclera, pink conjunctiva, MMM, oropharynx clear Neck- Supple, no thryomegaly CVS- RRR, 2/6 SEM   RESP-CTAB ABD-NABS,soft,suprapubic tenderness, no rebound no guarding, mild Right flank pain ND  MSK- Mild TTP lumbar spine, fair ROM spine, decreased ROM bilat knees, no effusion  EXT- pedal edema LE  , lymphedema LUE Pulses- Radial  2       Assessment & Plan:      Problem List Items Addressed This Visit    None    Visit Diagnoses    Acute cystitis with hematuria    -  Primary   persistant symptoms, start Cipro x 3 days, await culture   Relevant Orders   Ambulatory referral to Urology   DDD (degenerative disc disease), lumbar       norco 7.5mg  given, also use muscle rub on knees, hold on any referrals and see if pain dies down   Relevant Medications   HYDROcodone-acetaminophen (NORCO) 7.5-325 MG tablet   Renal cyst       Refer to urology, initiated at hospital, family would like evaluated    Relevant Orders   Ambulatory referral to Urology   Primary osteoarthritis of both knees       Relevant Medications   HYDROcodone-acetaminophen (NORCO) 7.5-325 MG tablet      Note: This dictation was prepared with Dragon dictation along with smaller phrase technology. Any transcriptional errors that result from this process are unintentional.

## 2016-05-06 LAB — URINE CULTURE: Culture: <10000 — AB

## 2016-05-13 ENCOUNTER — Ambulatory Visit (INDEPENDENT_AMBULATORY_CARE_PROVIDER_SITE_OTHER): Payer: Medicare PPO | Admitting: Urology

## 2016-05-13 DIAGNOSIS — N281 Cyst of kidney, acquired: Secondary | ICD-10-CM

## 2016-05-28 ENCOUNTER — Other Ambulatory Visit: Payer: Self-pay | Admitting: Urology

## 2016-05-28 DIAGNOSIS — N281 Cyst of kidney, acquired: Secondary | ICD-10-CM

## 2016-05-30 ENCOUNTER — Other Ambulatory Visit: Payer: Self-pay | Admitting: Family Medicine

## 2016-06-03 ENCOUNTER — Ambulatory Visit (HOSPITAL_COMMUNITY)
Admission: RE | Admit: 2016-06-03 | Discharge: 2016-06-03 | Disposition: A | Discharge status: Home / Self Care | Payer: Medicare PPO | Source: Ambulatory Visit | Attending: Urology | Admitting: Urology

## 2016-06-03 ENCOUNTER — Encounter (HOSPITAL_COMMUNITY): Payer: Self-pay

## 2016-06-04 ENCOUNTER — Other Ambulatory Visit: Payer: Self-pay | Admitting: Family Medicine

## 2016-06-13 ENCOUNTER — Encounter: Payer: Self-pay | Admitting: Family Medicine

## 2016-07-18 ENCOUNTER — Encounter: Payer: Self-pay | Admitting: Family Medicine

## 2016-07-18 ENCOUNTER — Ambulatory Visit (INDEPENDENT_AMBULATORY_CARE_PROVIDER_SITE_OTHER): Payer: Medicare PPO | Admitting: Family Medicine

## 2016-07-18 VITALS — BP 142/78 | HR 56 | Temp 97.8°F | Resp 14 | Ht 66.0 in | Wt 228.0 lb

## 2016-07-18 DIAGNOSIS — E782 Mixed hyperlipidemia: Secondary | ICD-10-CM | POA: Diagnosis not present

## 2016-07-18 DIAGNOSIS — I10 Essential (primary) hypertension: Secondary | ICD-10-CM | POA: Diagnosis not present

## 2016-07-18 DIAGNOSIS — E039 Hypothyroidism, unspecified: Secondary | ICD-10-CM

## 2016-07-18 DIAGNOSIS — Z6837 Body mass index (BMI) 37.0-37.9, adult: Secondary | ICD-10-CM | POA: Diagnosis not present

## 2016-07-18 DIAGNOSIS — I2510 Atherosclerotic heart disease of native coronary artery without angina pectoris: Secondary | ICD-10-CM

## 2016-07-18 DIAGNOSIS — Z0000 Encounter for general adult medical examination without abnormal findings: Secondary | ICD-10-CM | POA: Diagnosis not present

## 2016-07-18 LAB — CBC WITH DIFFERENTIAL/PLATELET
BASOS PCT: 0 %
Basophils Absolute: 0 cells/uL (ref 0–200)
EOS ABS: 385 {cells}/uL (ref 15–500)
Eosinophils Relative: 5 %
HCT: 39.3 % (ref 35.0–45.0)
Hemoglobin: 12.7 g/dL (ref 12.0–15.0)
LYMPHS PCT: 21 %
Lymphs Abs: 1617 cells/uL (ref 850–3900)
MCH: 25.5 pg — AB (ref 27.0–33.0)
MCHC: 32.3 g/dL (ref 32.0–36.0)
MCV: 78.9 fL — AB (ref 80.0–100.0)
MONOS PCT: 6 %
MPV: 10 fL (ref 7.5–12.5)
Monocytes Absolute: 462 cells/uL (ref 200–950)
NEUTROS ABS: 5236 {cells}/uL (ref 1500–7800)
Neutrophils Relative %: 68 %
PLATELETS: 170 10*3/uL (ref 140–400)
RBC: 4.98 MIL/uL (ref 3.80–5.10)
RDW: 16.7 % — AB (ref 11.0–15.0)
WBC: 7.7 10*3/uL (ref 3.8–10.8)

## 2016-07-18 LAB — BASIC METABOLIC PANEL
BUN: 28 mg/dL — ABNORMAL HIGH (ref 7–25)
CALCIUM: 9.9 mg/dL (ref 8.6–10.4)
CHLORIDE: 103 mmol/L (ref 98–110)
CO2: 21 mmol/L (ref 20–31)
CREATININE: 1.38 mg/dL — AB (ref 0.60–0.93)
Glucose, Bld: 103 mg/dL — ABNORMAL HIGH (ref 70–99)
Potassium: 3.9 mmol/L (ref 3.5–5.3)
Sodium: 139 mmol/L (ref 135–146)

## 2016-07-18 LAB — T3, FREE: T3 FREE: 2.7 pg/mL (ref 2.3–4.2)

## 2016-07-18 LAB — T4, FREE: FREE T4: 1.6 ng/dL (ref 0.8–1.8)

## 2016-07-18 LAB — TSH: TSH: 0.02 mIU/L — ABNORMAL LOW

## 2016-07-18 MED ORDER — AMLODIPINE BESYLATE 10 MG PO TABS: 10.0000 mg | ORAL_TABLET | Freq: Every day | ORAL | 2 refills | Status: DC

## 2016-07-18 MED ORDER — LOSARTAN POTASSIUM-HCTZ 50-12.5 MG PO TABS: 1.0000 | ORAL_TABLET | Freq: Every day | ORAL | 2 refills | Status: DC

## 2016-07-18 MED ORDER — ROSUVASTATIN CALCIUM 40 MG PO TABS: 40.0000 mg | ORAL_TABLET | Freq: Every day | ORAL | 2 refills | Status: DC

## 2016-07-18 MED ORDER — CLOPIDOGREL BISULFATE 75 MG PO TABS: 75.0000 mg | ORAL_TABLET | Freq: Every day | ORAL | 3 refills | Status: DC

## 2016-07-18 NOTE — Progress Notes (Signed)
Subjective:   Patient presents for Medicare Annual/Subsequent preventive examination.  Her last visit in April she was referred to urology secondary to some hematuria as well as renal cysts. Ultrasound was ordered and follow-up will be pending results of this however ultrasound has not been done yet.  Hypothyroidism also due for repeat thyroid function studies does not follow with her endocrinologist her last thyroid level with barely detectable. She was decreased  To 125 g   She has had recent cholesterol as well as A1c which were normal.  No concerns today    Review Past Medical/Family/Social: Per EMR  Risk Factors  Current exercise habits: minimal walking Dietary issues discussed: Yes  Cardiac risk factors: Obesity (BMI >= 30 kg/m2).   Depression Screen  (Note: if answer to either of the following is "Yes", a more complete depression screening is indicated)  Over the past two weeks, have you felt down, depressed or hopeless? No Over the past two weeks, have you felt little interest or pleasure in doing things? No Have you lost interest or pleasure in daily life? No Do you often feel hopeless? No Do you cry easily over simple problems? No   Activities of Daily Living  In your present state of health, do you have any difficulty performing the following activities?:  Driving? No  Managing money? No  Feeding yourself? No  Getting from bed to chair? No  Climbing a flight of stairs? No  Preparing food and eating?: No  Bathing or showering? No  Getting dressed: No  Getting to the toilet? No  Using the toilet:No  Moving around from place to place: No  In the past year have you fallen or had a near fall?:No  Are you sexually active? No  Do you have more than one partner? No   Hearing Difficulties: occ Do you often ask people to speak up or repeat themselves? No  Do you experience ringing or noises in your ears? yes Do you have difficulty understanding soft or whispered  voices? No  Do you feel that you have a problem with memory? No Do you often misplace items? No  Do you feel safe at home? Yes  Cognitive Testing  Alert? Yes Normal Appearance?Yes  Oriented to person? Yes Place? Yes  Time? Yes  Recall of three objects? Yes  Can perform simple calculations? Yes  Displays appropriate judgment?Yes  Can read the correct time from a watch face?Yes   List the Names of Other Physician/Practitioners you currently use:   urology, cardiology, orthopedics  Screening Tests / Date UTD or aged out Colonoscopy                     pNEUMONIA- utd Mammogram  Shinlges- NOT COVERED  Tetanus/tdap Bone Density UTD   Ros: GEN- denies fatigue, fever, weight loss,weakness, recent illness HEENT- denies eye drainage, change in vision, nasal discharge, CVS- denies chest pain, palpitations RESP- denies SOB, cough, wheeze ABD- denies N/V, change in stools, abd pain GU- denies dysuria, hematuria, dribbling, incontinence MSK- denies joint pain, muscle aches, injury Neuro- denies headache, dizziness, syncope, seizure activity  pHYSICAL: GEN- NAD, alert and oriented x3 HEENT- PERRL, EOMI, non injected sclera, pink conjunctiva, MMM, oropharynx clear Neck- Supple, no thryomegaly,no bruit  CVS- RRR, 2/6 SEM  , HR 60's RESP-CTAB ABD-NABS,SOFT,NT,ND EXT- pedal edema, chronic lymphedema LUE Pulses- Radial, DP- 2+   Assessment:    Annual wellness medicare exam   Plan:    During the course of the  visit the patient was educated and counseled about appropriate screening and preventive services including:  Discussed advanced directives, given handouts   Preventative medicine UTD    Screen neg for depression. PHQ- 9 score of  0   Fasting labs today     Blood pressure looks good, no diureteics taken this AM due to fasting  Recheck thyroid function studies   Recommend she get the renal ultrasound, daughter with reschedule    Diet review for nutrition referral? Yes  ____ Not Indicated __x__  Patient Instructions (the written plan) was given to the patient.  Medicare Attestation  I have personally reviewed:  The patient's medical and social history  Their use of alcohol, tobacco or illicit drugs  Their current medications and supplements  The patient's functional ability including ADLs,fall risks, home safety risks, cognitive, and hearing and visual impairment  Diet and physical activities  Evidence for depression or mood disorders  The patient's weight, height, BMI, and visual acuity have been recorded in the chart. I have made referrals, counseling, and provided education to the patient based on review of the above and I have provided the patient with a written personalized care plan for preventive services.

## 2016-07-18 NOTE — Patient Instructions (Addendum)
F/U 4 months  [phone]- Radiology and reschedule ultrasound

## 2016-07-21 ENCOUNTER — Encounter: Payer: Self-pay | Admitting: Cardiology

## 2016-07-21 NOTE — Progress Notes (Signed)
Cardiology Office Note  Date: 07/22/2016   ID: Robin [name], DOB 1940-05-11, MRN [id number]  PCP: Alycia Rossetti, MD  Primary Cardiologist: Rozann Lesches, MD   Chief Complaint  Patient presents with  . Coronary Artery Disease  . History of cardiomyopathy    History of Present Illness: Robin [name] is a 76 y.o. female last seen in December 2017. She is here today with her daughter for a follow-up visit. Continues to do well without angina symptoms or nitroglycerin use. She does not report any progressive shortness of breath or leg edema.  I reviewed her most recent lab work as outlined below. LDL 72 on Crestor.  She is due for follow-up carotid Dopplers, has prior history of left CEA. We discussed this today.  I personally reviewed her ECG today which shows sinus bradycardia.  Past Medical History:  Diagnosis Date  . Breast cancer ([place])    Tamoxifen started 2007  . CAD (coronary artery disease)    DES second diagonal 2/12  . Cardiomyopathy    Probable Takotsubo, LVEF 30-35% 2/12  . Carotid artery disease ([place])   . Essential hypertension   . Hepatic steatosis   . Hyperlipidemia   . Hypothyroidism   . NSTEMI (non-ST elevated myocardial infarction) ([place])    2/12  . Pancreas divisum   . Stroke ([place])    (2000) residual left-sided weakness  . Type 2 diabetes mellitus ([place])     Past Surgical History:  Procedure Laterality Date  . ABDOMINAL HYSTERECTOMY    . CAROTID ENDARTERECTOMY     Left  . CHOLECYSTECTOMY    . JOINT REPLACEMENT     Right [facility]  . MASTECTOMY     Bilateral    Current Outpatient Prescriptions  Medication Sig Dispense Refill  . amLODipine (NORVASC) 10 MG tablet Take 1 tablet (10 mg total) by mouth daily. 90 tablet 2  . aspirin 81 MG tablet Take 81 mg by mouth daily.      . carvedilol (COREG) 12.5 MG tablet Take 1.5 tablets (18.75 mg total) by mouth 2 (two) times daily. 270 tablet 3  . clopidogrel  (PLAVIX) 75 MG tablet Take 1 tablet (75 mg total) by mouth daily. 90 tablet 3  . docusate sodium (COLACE) 100 MG capsule Take 100 mg by mouth as needed for mild constipation.    Marland Kitchen HYDROcodone-acetaminophen (NORCO) 7.5-325 MG tablet Take 1 tablet by mouth every 6 (six) hours as needed for moderate pain. 45 tablet 0  . levothyroxine (SYNTHROID, LEVOTHROID) 125 MCG tablet Take 1 tablet (125 mcg total) by mouth daily. 90 tablet 3  . losartan-hydrochlorothiazide (HYZAAR) 50-12.5 MG tablet Take 1 tablet by mouth daily. 90 tablet 2  . nitroGLYCERIN (NITROSTAT) 0.4 MG SL tablet Place 1 tablet (0.4 mg total) under the tongue every 5 (five) minutes x 3 doses as needed. 25 tablet 3  . Omega-3 Fatty Acids 300 MG CAPS Take 1 capsule (300 mg total) by mouth 2 (two) times daily.    . rosuvastatin (CRESTOR) 40 MG tablet Take 1 tablet (40 mg total) by mouth daily. 90 tablet 2   No current facility-administered medications for this visit.    Allergies:  Codeine   Social History: The patient  reports that she has never smoked. She has never used smokeless tobacco. She reports that she does not drink alcohol or use drugs.   ROS:  Please see the history of present illness. Otherwise, complete review of systems  is positive for none.  All other systems are reviewed and negative.   Physical Exam: VS:  BP 138/82   Pulse (!) 57   Ht _0  (1.676 m)   Wt 230 lb 6.4 oz (104.5 kg)   SpO2 98%   BMI 37.19 kg/m , BMI Body mass index is 37.19 kg/m.  Wt Readings from Last 3 Encounters:  07/22/16 230 lb 6.4 oz (104.5 kg)  07/18/16 228 lb (103.4 kg)  05/05/16 225 lb (102.1 kg)    Obese woman in no acute distress.  HEENT: Conjunctiva and lids normal, oropharynx clear with moist mucosa.  Neck: Supple, no elevated JVP, left CEA scar, no thyromegaly.  Lungs: Clear to auscultation, nonlabored breathing at rest.  Cardiac: Regular rate and rhythm, no S3, 2/6 systolic murmur, no pericardial rub.  Abdomen: Soft,  nontender, no hepatomegaly, bowel sounds present, no guarding or rebound.  Extremities: 1+ ankle edema bilaterally.  Skin: Warm and dry. Muscular skeletal: No kyphosis. Neuropsychiatric: Alert and oriented 3, affect upper..  ECG: I personally reviewed the tracing from 04/26/2015 which showed sinus rhythm with increased voltage.  Recent Labwork: 04/08/2016: ALT 14; AST 20 07/18/2016: BUN 28; Creat 1.38; Hemoglobin 12.7; Platelets 170; Potassium 3.9; Sodium 139; TSH 0.02     Component Value Date/Time   CHOL 140 04/08/2016 0910   TRIG 98 04/08/2016 0910   HDL 48 (L) 04/08/2016 0910   CHOLHDL 2.9 04/08/2016 0910   VLDL 20 04/08/2016 0910   LDLCALC 72 04/08/2016 0910    Other Studies Reviewed Today:  Echocardiogram 04/27/2015: Study Conclusions  - Left ventricle: The cavity size was normal. Wall thickness was  increased in a pattern of severe LVH. Systolic function was  normal. The estimated ejection fraction was in the range of 60%  to 65%. Wall motion was normal; there were no regional wall  motion abnormalities. Doppler parameters are consistent with  abnormal left ventricular relaxation (grade 1 diastolic  dysfunction). Doppler parameters are consistent with high  ventricular filling pressure. - Aortic valve: Mildly calcified annulus. Trileaflet; mildly  calcified leaflets. - Mitral valve: Calcified annulus. There was trivial regurgitation. - Left atrium: The atrium was mildly dilated. - Right atrium: Central venous pressure (est): 3 mm Hg. - Tricuspid valve: There was trivial regurgitation. - Pulmonary arteries: PA peak pressure: 26 mm Hg (S). - Pericardium, extracardiac: There was no pericardial effusion.  Impressions:  - Severe LVH with LVEF 60-65%. Grade 1 diastolic dysfunction with  increased LV filling pressures. Mild left atrial enlargement. MAC  with trivial mitral regurgitation. Mildly sclerotic aortic valve.  Trivial tricuspid regurgitation with PASP  26 mmHg mmHg.  Assessment and Plan:  1. Symptomatically stable CAD status post DES to the second diagonal in 2012. In the absence of angina symptoms on medical therapy will continue with observation. ECG is normal.  2. Carotid artery disease status post prior left CEA. We will arrange follow-up carotid Dopplers. Continue aspirin and statin therapy.  3. History of Takotsubo cardiomyopathy with normalization of LVEF by follow-up echocardiogram last year. Continue medical therapy, includes Coreg and Hyzaar.  4. Essential hypertension, blood pressure is adequately controlled today. No changes were made.  Current medicines were reviewed with the patient today.   Orders Placed This Encounter  Procedures  . EKG 12-Lead    Disposition: Follow-up in 6 months.  Signed, Satira Sark, MD, [facility] 07/22/2016 8:54 AM    [facility] [place], [place], [place] [address] Phone: [phone])  [phone]; Fax: [phone]

## 2016-07-22 ENCOUNTER — Ambulatory Visit (INDEPENDENT_AMBULATORY_CARE_PROVIDER_SITE_OTHER): Payer: Medicare PPO | Admitting: Cardiology

## 2016-07-22 ENCOUNTER — Encounter: Payer: Self-pay | Admitting: Cardiology

## 2016-07-22 VITALS — BP 138/82 | HR 57 | Ht 66.0 in | Wt 230.4 lb

## 2016-07-22 DIAGNOSIS — I2510 Atherosclerotic heart disease of native coronary artery without angina pectoris: Secondary | ICD-10-CM | POA: Diagnosis not present

## 2016-07-22 DIAGNOSIS — Z8679 Personal history of other diseases of the circulatory system: Secondary | ICD-10-CM | POA: Diagnosis not present

## 2016-07-22 DIAGNOSIS — I779 Disorder of arteries and arterioles, unspecified: Secondary | ICD-10-CM

## 2016-07-22 DIAGNOSIS — I739 Peripheral vascular disease, unspecified: Secondary | ICD-10-CM

## 2016-07-22 DIAGNOSIS — I10 Essential (primary) hypertension: Secondary | ICD-10-CM | POA: Diagnosis not present

## 2016-07-22 NOTE — Patient Instructions (Signed)
Medication Instructions:  Your physician recommends that you continue on your current medications as directed. Please refer to the Current Medication list given to you today.  Labwork: NONE  Testing/Procedures: Your physician has requested that you have a carotid duplex. This test is an ultrasound of the carotid arteries in your neck. It looks at blood flow through these arteries that supply the brain with blood. Allow one hour for this exam. There are no restrictions or special instructions.  Follow-Up: Your physician wants you to follow-up in: 6 MONTHS WITH DR. MCDOWELL You will receive a reminder letter in the mail two months in advance. If you don't receive a letter, please call our office to schedule the follow-up appointment.  Any Other Special Instructions Will Be Listed Below (If Applicable).  If you need a refill on your cardiac medications before your next appointment, please call your pharmacy. 

## 2016-07-29 ENCOUNTER — Other Ambulatory Visit: Payer: Self-pay | Admitting: *Deleted

## 2016-07-29 DIAGNOSIS — E039 Hypothyroidism, unspecified: Secondary | ICD-10-CM

## 2016-07-29 MED ORDER — LEVOTHYROXINE SODIUM 112 MCG PO TABS: 112.0000 ug | ORAL_TABLET | Freq: Every day | ORAL | 3 refills | Status: DC

## 2016-07-31 ENCOUNTER — Ambulatory Visit: Payer: Medicare PPO

## 2016-07-31 DIAGNOSIS — I779 Disorder of arteries and arterioles, unspecified: Secondary | ICD-10-CM

## 2016-07-31 DIAGNOSIS — I739 Peripheral vascular disease, unspecified: Principal | ICD-10-CM

## 2016-07-31 LAB — VAS US CAROTID
LCCADDIAS: -13 cm/s
LEFT ECA DIAS: -11 cm/s
LEFT VERTEBRAL DIAS: -24 cm/s
LICADDIAS: -30 cm/s
LICAPDIAS: -14 cm/s
Left CCA dist sys: -99 cm/s
Left CCA prox dias: 0 cm/s
Left CCA prox sys: -128 cm/s
Left ICA dist sys: -112 cm/s
Left ICA prox sys: -129 cm/s
RIGHT ECA DIAS: -15 cm/s
RIGHT VERTEBRAL DIAS: -10 cm/s
Right CCA prox dias: 18 cm/s
Right CCA prox sys: 130 cm/s
Right cca dist sys: -148 cm/s

## 2016-08-04 ENCOUNTER — Telehealth: Payer: Self-pay | Admitting: *Deleted

## 2016-08-04 NOTE — Telephone Encounter (Signed)
-----   Message from Satira Sark, MD sent at 08/01/2016  9:06 AM EDT ----- Results reviewed. 1-39% ICA stenosis, bilaterally. Patent left endarterectomy site. Continue current medical therapy and follow-up plan. A copy of this test should be forwarded to [facility], Modena Nunnery, MD.

## 2016-08-04 NOTE — Telephone Encounter (Signed)
Patient informed. 

## 2016-08-28 ENCOUNTER — Ambulatory Visit (INDEPENDENT_AMBULATORY_CARE_PROVIDER_SITE_OTHER): Payer: Medicare PPO | Admitting: "Endocrinology

## 2016-08-28 ENCOUNTER — Encounter: Payer: Self-pay | Admitting: "Endocrinology

## 2016-08-28 VITALS — BP 188/92 | HR 56 | Ht 66.0 in | Wt 228.0 lb

## 2016-08-28 DIAGNOSIS — E039 Hypothyroidism, unspecified: Secondary | ICD-10-CM | POA: Diagnosis not present

## 2016-08-28 DIAGNOSIS — R7303 Prediabetes: Secondary | ICD-10-CM

## 2016-08-28 MED ORDER — LEVOTHYROXINE SODIUM 100 MCG PO TABS: 100.0000 ug | ORAL_TABLET | Freq: Every day | ORAL | 3 refills | Status: DC

## 2016-08-28 NOTE — Progress Notes (Signed)
Subjective:    Patient ID: Robin [name], female    DOB: [birth year], PCP Alycia Rossetti, MD   Past Medical History:  Diagnosis Date  . Breast cancer ([place])    Tamoxifen started 2007  . CAD (coronary artery disease)    DES second diagonal 2/12  . Cardiomyopathy    Probable Takotsubo, LVEF 30-35% 2/12  . Carotid artery disease ([place])   . Essential hypertension   . Hepatic steatosis   . Hyperlipidemia   . Hypothyroidism   . NSTEMI (non-ST elevated myocardial infarction) ([place])    2/12  . Pancreas divisum   . Stroke ([place])    (2000) residual left-sided weakness  . Type 2 diabetes mellitus ([place])    Past Surgical History:  Procedure Laterality Date  . ABDOMINAL HYSTERECTOMY    . CAROTID ENDARTERECTOMY     Left  . CHOLECYSTECTOMY    . JOINT REPLACEMENT     [facility]  . MASTECTOMY     Bilateral   Social History   Social History  . Marital status: Widowed    Spouse name: N/A  . Number of children: N/A  . Years of education: N/A   Social History Main Topics  . Smoking status: Never Smoker  . Smokeless tobacco: Never Used  . Alcohol use No  . Drug use: No  . Sexual activity: Not Asked   Other Topics Concern  . None   Social History Narrative  . None   Outpatient Encounter Prescriptions as of 08/28/2016  Medication Sig  . amLODipine (NORVASC) 10 MG tablet Take 1 tablet (10 mg total) by mouth daily.  Marland Kitchen aspirin 81 MG tablet Take 81 mg by mouth daily.    . carvedilol (COREG) 12.5 MG tablet Take 1.5 tablets (18.75 mg total) by mouth 2 (two) times daily.  . clopidogrel (PLAVIX) 75 MG tablet Take 1 tablet (75 mg total) by mouth daily.  Marland Kitchen docusate sodium (COLACE) 100 MG capsule Take 100 mg by mouth as needed for mild constipation.  Marland Kitchen HYDROcodone-acetaminophen (NORCO) 7.5-325 MG tablet Take 1 tablet by mouth every 6 (six) hours as needed for moderate pain.  Marland Kitchen levothyroxine (SYNTHROID, LEVOTHROID) 100 MCG tablet Take 1 tablet (100  mcg total) by mouth daily before breakfast.  . losartan-hydrochlorothiazide (HYZAAR) 50-12.5 MG tablet Take 1 tablet by mouth daily.  . nitroGLYCERIN (NITROSTAT) 0.4 MG SL tablet Place 1 tablet (0.4 mg total) under the tongue every 5 (five) minutes x 3 doses as needed.  . Omega-3 Fatty Acids 300 MG CAPS Take 1 capsule (300 mg total) by mouth 2 (two) times daily.  . rosuvastatin (CRESTOR) 40 MG tablet Take 1 tablet (40 mg total) by mouth daily.  . [DISCONTINUED] levothyroxine (SYNTHROID, LEVOTHROID) 112 MCG tablet Take 1 tablet (112 mcg total) by mouth daily.   No facility-administered encounter medications on file as of 08/28/2016.    ALLERGIES: Allergies  Allergen Reactions  . Codeine Nausea And Vomiting    VACCINATION STATUS: Immunization History  Administered Date(s) Administered  . Influenza,inj,Quad PF,36+ Mos 10/31/2014, 11/20/2015  . Pneumococcal Conjugate-13 12/01/2012    HPI 76 yr old female patient with medical hx as above. She is being seen in consultation for hypothyroidism requested by Dr. Buelah Manis. -He was diagnosed with hypothyroidism approximately at age 76 since when she was given various doses of thyroid hormone, currently at 112 g of Synthroid by mouth every morning. -She was found to have thyroid function tests fluctuating. -  She reports intolerance to 125 g of Synthroid. She has steady weight lately. She denies heat/cold intolerance. She denies palpitations nor tremors.   She denies any history of goiter. She has family history of thyroid dysfunction in one of her daughters. She denies any family history of thyroid cancer. -She reports compliance to her thyroid hormone. Her most recent thyroid function tests from 07/18/2016 showed suppressed TSH of 0.02 along with normal free T4 1.6.  Review of Systems  Constitutional:  Steady weight , no fatigue, no subjective hyperthermia, no subjective hypothermia Eyes: no blurry vision, no xerophthalmia ENT: no sore throat,  no nodules palpated in throat, no dysphagia/odynophagia, no hoarseness Cardiovascular: no Chest Pain, no Shortness of Breath, no palpitations, no leg swelling Respiratory: no cough, no SOB Gastrointestinal: no Nausea/Vomiting/Diarhhea Musculoskeletal: no muscle/joint aches Skin: no rashes Neurological: no tremors, no numbness, no tingling, no dizziness Psychiatric: no depression, no anxiety  Objective:    BP (!) 188/92   Pulse (!) 56   Ht 5\' 6"  (1.676 m)   Wt 228 lb (103.4 kg)   SpO2 98%   BMI 36.80 kg/m   Wt Readings from Last 3 Encounters:  08/28/16 228 lb (103.4 kg)  07/22/16 230 lb 6.4 oz (104.5 kg)  07/18/16 228 lb (103.4 kg)    Physical Exam  Constitutional:  Obese for height, not in acute distress, normal state of mind Eyes: PERRLA, EOMI, no exophthalmos ENT: moist mucous membranes, no thyromegaly, no cervical lymphadenopathy Cardiovascular: normal precordial activity, Regular Rate and Rhythm, no Murmur/Rubs/Gallops Respiratory:  adequate breathing efforts, no gross chest deformity, Clear to auscultation bilaterally Gastrointestinal: abdomen soft, Non -tender, No distension, Bowel Sounds present Musculoskeletal: no gross deformities, strength intact in all four extremities Skin: moist, warm, no rashes Neurological: no tremor with outstretched hands, Deep tendon reflexes normal in all four extremities.  CMP ( most recent) CMP     Component Value Date/Time   NA 139 07/18/2016 0956   K 3.9 07/18/2016 0956   CL 103 07/18/2016 0956   CO2 21 07/18/2016 0956   GLUCOSE 103 (H) 07/18/2016 0956   BUN 28 (H) 07/18/2016 0956   CREATININE 1.38 (H) 07/18/2016 0956   CALCIUM 9.9 07/18/2016 0956   PROT 7.4 04/08/2016 0910   ALBUMIN 4.2 04/08/2016 0910   AST 20 04/08/2016 0910   ALT 14 04/08/2016 0910   ALKPHOS 92 04/08/2016 0910   BILITOT 0.5 04/08/2016 0910   GFRNONAA 58 (L) 04/27/2015 0408   GFRNONAA 46 (L) 05/03/2012 1015   GFRAA >60 04/27/2015 0408   GFRAA 54 (L)  05/03/2012 1015     Diabetic Labs (most recent): Lab Results  Component Value Date   HGBA1C 5.6 04/08/2016   HGBA1C 6.2 (H) 07/17/2015   HGBA1C 6.2 (H) 03/05/2015     Lipid Panel ( most recent) Lipid Panel     Component Value Date/Time   CHOL 140 04/08/2016 0910   TRIG 98 04/08/2016 0910   HDL 48 (L) 04/08/2016 0910   CHOLHDL 2.9 04/08/2016 0910   VLDL 20 04/08/2016 0910   LDLCALC 72 04/08/2016 0910    Results for SAFINA, HUARD (MRN [id number]) as of 08/28/2016 13:21  Ref. Range 07/18/2016 09:56  TSH Latest Units: mIU/L 0.02 (L)  Triiodothyronine,Free,Serum Latest Ref Range: 2.3 - 4.2 pg/mL 2.7  T4,Free(Direct) Latest Ref Range: 0.8 - 1.8 ng/dL 1.6    Assessment & Plan:   1. Hypothyroidism, unspecified type - The patient is being seen at the kind request of Dr. Buelah Manis.  I have reviewed her available thyroid records and clinically evaluated this patient. She has well established long-standing hypothyroidism since age 58, etiology unclear. - She seems to have reacted to higher dose of thyroid hormone trials in the past. - Based on her most recent thyroid function tests, she will be given a lower dose of thyroid hormone of  levothyroxine 100 g by mouth every morning.  - We discussed about correct intake of levothyroxine, at fasting, with water, separated by at least 30 minutes from breakfast, and separated by more than 4 hours from calcium, iron, multivitamins, acid reflux medications (PPIs). -Patient is made aware of the fact that thyroid hormone replacement is needed for life, dose to be adjusted by periodic monitoring of thyroid function tests. - Her next lab work will include antithyroid antibodies in an attempt to establish etiology for hypothyroidism.   - I advised patient to maintain close follow up with Alycia Rossetti, MD for primary care needs. Follow up plan: Return in about 3 months (around 11/28/2016) for follow up with pre-visit labs.  Glade Lloyd, MD Phone:  [phone]  Fax: [phone]   08/28/2016, 1:10 PM

## 2016-10-01 ENCOUNTER — Telehealth: Payer: Self-pay

## 2016-10-01 MED ORDER — HYDROCODONE-ACETAMINOPHEN 5-325 MG PO TABS: ORAL_TABLET | ORAL | 0 refills | Status: DC

## 2016-10-01 NOTE — Telephone Encounter (Signed)
We can try a lower dose, Norco 5-325mg  , she can take 1/2 to 1 full tablet BID prn pain #45 tablets If they are unable to come get a new script then break the 7.5-325mg  tablet in half for now

## 2016-10-01 NOTE — Telephone Encounter (Signed)
RX CAN BE PICKED UP TOMORROW PT IS AWARE

## 2016-10-01 NOTE — Telephone Encounter (Signed)
Daughter called and left a message that her mom was taking a medication that was causing her to become dizzy, daughter is requesting smaller dose . Called patient to find out which mediation she was having trouble with patient states it is the hydrocodone-acetaminophen  Pls advise

## 2016-11-03 DIAGNOSIS — L03113 Cellulitis of right upper limb: Secondary | ICD-10-CM | POA: Diagnosis not present

## 2016-11-16 ENCOUNTER — Other Ambulatory Visit: Payer: Self-pay | Admitting: Family Medicine

## 2016-11-17 ENCOUNTER — Ambulatory Visit (INDEPENDENT_AMBULATORY_CARE_PROVIDER_SITE_OTHER): Payer: Medicare PPO | Admitting: Family Medicine

## 2016-11-17 ENCOUNTER — Encounter: Payer: Self-pay | Admitting: Family Medicine

## 2016-11-17 VITALS — BP 140/88 | HR 58 | Temp 98.0°F | Resp 16 | Ht 66.0 in | Wt 232.0 lb

## 2016-11-17 DIAGNOSIS — E782 Mixed hyperlipidemia: Secondary | ICD-10-CM

## 2016-11-17 DIAGNOSIS — I10 Essential (primary) hypertension: Secondary | ICD-10-CM | POA: Diagnosis not present

## 2016-11-17 DIAGNOSIS — R7303 Prediabetes: Secondary | ICD-10-CM | POA: Diagnosis not present

## 2016-11-17 DIAGNOSIS — Z23 Encounter for immunization: Secondary | ICD-10-CM

## 2016-11-17 DIAGNOSIS — K5900 Constipation, unspecified: Secondary | ICD-10-CM

## 2016-11-17 DIAGNOSIS — Z6837 Body mass index (BMI) 37.0-37.9, adult: Secondary | ICD-10-CM

## 2016-11-17 LAB — CBC WITH DIFFERENTIAL/PLATELET
BASOS PCT: 0.3 %
Basophils Absolute: 28 cells/uL (ref 0–200)
Eosinophils Absolute: 202 cells/uL (ref 15–500)
Eosinophils Relative: 2.2 %
HEMATOCRIT: 35.7 % (ref 35.0–45.0)
Hemoglobin: 11.9 g/dL (ref 11.7–15.5)
LYMPHS ABS: 1509 {cells}/uL (ref 850–3900)
MCH: 26.3 pg — ABNORMAL LOW (ref 27.0–33.0)
MCHC: 33.3 g/dL (ref 32.0–36.0)
MCV: 78.8 fL — AB (ref 80.0–100.0)
MPV: 11.8 fL (ref 7.5–12.5)
Monocytes Relative: 8.6 %
Neutro Abs: 6670 cells/uL (ref 1500–7800)
Neutrophils Relative %: 72.5 %
PLATELETS: 166 10*3/uL (ref 140–400)
RBC: 4.53 10*6/uL (ref 3.80–5.10)
RDW: 15.9 % — ABNORMAL HIGH (ref 11.0–15.0)
Total Lymphocyte: 16.4 %
WBC: 9.2 10*3/uL (ref 3.8–10.8)
WBCMIX: 791 {cells}/uL (ref 200–950)

## 2016-11-17 LAB — COMPREHENSIVE METABOLIC PANEL
AG Ratio: 1.3 (calc) (ref 1.0–2.5)
ALBUMIN MSPROF: 4 g/dL (ref 3.6–5.1)
ALKALINE PHOSPHATASE (APISO): 75 U/L (ref 33–130)
ALT: 20 U/L (ref 6–29)
AST: 24 U/L (ref 10–35)
BILIRUBIN TOTAL: 0.6 mg/dL (ref 0.2–1.2)
BUN / CREAT RATIO: 22 (calc) (ref 6–22)
BUN: 26 mg/dL — ABNORMAL HIGH (ref 7–25)
CO2: 23 mmol/L (ref 20–32)
CREATININE: 1.19 mg/dL — AB (ref 0.60–0.93)
Calcium: 9.8 mg/dL (ref 8.6–10.4)
Chloride: 106 mmol/L (ref 98–110)
GLOBULIN: 3.2 g/dL (ref 1.9–3.7)
Glucose, Bld: 114 mg/dL — ABNORMAL HIGH (ref 65–99)
Potassium: 4.2 mmol/L (ref 3.5–5.3)
Sodium: 139 mmol/L (ref 135–146)
TOTAL PROTEIN: 7.2 g/dL (ref 6.1–8.1)

## 2016-11-17 LAB — LIPID PANEL
CHOL/HDL RATIO: 2.7 (calc) (ref <5.0)
CHOLESTEROL: 142 mg/dL (ref <200)
HDL: 52 mg/dL (ref >50)
LDL Cholesterol (Calc): 73 mg/dL (calc)
Non-HDL Cholesterol (Calc): 90 mg/dL (calc) (ref <130)
TRIGLYCERIDES: 91 mg/dL (ref <150)

## 2016-11-17 NOTE — Assessment & Plan Note (Signed)
The setting of coronary artery disease carotid artery disease we will recheck her lipids she is on statin drug

## 2016-11-17 NOTE — Progress Notes (Signed)
   Subjective:    Patient ID: Robin [name], female    DOB: 04/16/1940, 76 y.o.   MRN: [id number]  Patient presents for Follow-up (is fasting)   Pt here to f/u chronic medical problems  Hypothyroidism followed by endocrinology now -last visit in sept, has appt 15th of November  Seen by cardiology in July - had carotid doppler done, due for repeat Cholesterol, had < 40% stenosis   CAD/Hyperlipidemia/HTN- taking BP medications, no side effects ,, no concerns with medications, no chest pain,no SOB   Gets gassy and has mild constipation- wanted recommendations on something to take. Has not taken OTC meds yet   Treated for spider bite right arm-  completed antibiotics ,no systemic symptoms  Flu shot due      Review Of Systems:  GEN- denies fatigue, fever, weight loss,weakness, recent illness HEENT- denies eye drainage, change in vision, nasal discharge, CVS- denies chest pain, palpitations RESP- denies SOB, cough, wheeze ABD- denies N/V, change in stools, abd pain GU- denies dysuria, hematuria, dribbling, incontinence MSK- denies joint pain, muscle aches, injury Neuro- denies headache, dizziness, syncope, seizure activity       Objective:    BP 140/88   Pulse (!) 58   Temp 98 F (36.7 C) (Oral)   Resp 16   Ht 5\' 6"  (1.676 m)   Wt 232 lb (105.2 kg)   SpO2 98%   BMI 37.45 kg/m  GEN- NAD, alert and oriented x3 HEENT- PERRL, EOMI, non injected sclera, pink conjunctiva, MMM, oropharynx clear Neck- Supple, no thyromegaly CVS- RRR, n2/6 SEM RESP-CTAB ABD-NABS,soft,NT,ND EXT- chronic pedal edema R>L Pulses- Radial, DP- 2+        Assessment & Plan:      Problem List Items Addressed This Visit      Unprioritized   Obesity   Prediabetes    Her last A1c was at 5.6% which is excellent we will does check this once a year she is to try to avoid too many sweets      Mixed hyperlipidemia    The setting of coronary artery disease carotid artery disease we will  recheck her lipids she is on statin drug      Relevant Orders   Lipid panel   Essential hypertension, benign - Primary    Blood pressure looks okay for her age and multiple medications.  This seems to be about her baseline no changes      Relevant Orders   CBC with Differential/Platelet   Comprehensive metabolic panel   Constipation    Mild constipation but mostly gas.  We will have her increase her fiber that does not work she can try stool softener       Other Visit Diagnoses    Need for influenza vaccination       Relevant Orders   Flu vaccine HIGH DOSE PF (Completed)      Note: This dictation was prepared with Dragon dictation along with smaller phrase technology. Any transcriptional errors that result from this process are unintentional.

## 2016-11-17 NOTE — Assessment & Plan Note (Signed)
Her last A1c was at 5.6% which is excellent we will does check this once a year she is to try to avoid too many sweets

## 2016-11-17 NOTE — Assessment & Plan Note (Signed)
Blood pressure looks okay for her age and multiple medications.  This seems to be about her baseline no changes

## 2016-11-17 NOTE — Patient Instructions (Addendum)
Add fiber Metamuil or fiber one If that does not work use stool softner  F/U 4 months

## 2016-11-17 NOTE — Assessment & Plan Note (Signed)
Mild constipation but mostly gas.  We will have her increase her fiber that does not work she can try stool softener

## 2016-11-20 ENCOUNTER — Encounter: Payer: Self-pay | Admitting: *Deleted

## 2016-11-28 DIAGNOSIS — E039 Hypothyroidism, unspecified: Secondary | ICD-10-CM | POA: Diagnosis not present

## 2016-12-01 LAB — TSH: TSH: 0.35 mIU/L — ABNORMAL LOW (ref 0.40–4.50)

## 2016-12-01 LAB — T3, FREE: T3, Free: 2.7 pg/mL (ref 2.3–4.2)

## 2016-12-01 LAB — T4, FREE: FREE T4: 1.3 ng/dL (ref 0.8–1.8)

## 2016-12-01 LAB — THYROGLOBULIN ANTIBODY: Thyroglobulin Ab: <1 IU/mL (ref <=1)

## 2016-12-01 LAB — THYROID PEROXIDASE ANTIBODY: Thyroperoxidase Ab SerPl-aCnc: 1 IU/mL (ref <9)

## 2016-12-04 ENCOUNTER — Ambulatory Visit: Payer: Medicare PPO | Admitting: "Endocrinology

## 2016-12-20 ENCOUNTER — Other Ambulatory Visit: Payer: Self-pay | Admitting: "Endocrinology

## 2016-12-22 ENCOUNTER — Telehealth: Payer: Self-pay | Admitting: "Endocrinology

## 2016-12-22 NOTE — Telephone Encounter (Signed)
Robin [name] is calling stating that she needs a refill on her levothyroxine (SYNTHROID, LEVOTHROID) 100 MCG tablet please advise

## 2016-12-22 NOTE — Telephone Encounter (Signed)
Rx sent 

## 2017-01-02 ENCOUNTER — Ambulatory Visit: Payer: Medicare PPO | Admitting: "Endocrinology

## 2017-01-04 NOTE — Progress Notes (Signed)
Cardiology Office Note  Date: 01/05/2017   ID: [name], DOB [birth year], MRN [id number]  PCP: Robin Rossetti, MD  Primary Cardiologist: Robin Lesches, MD   Chief Complaint  Patient presents with  . History of cardiomyopathy    History of Present Illness: Robin [name] is a 76 y.o. female last seen in July. She presents with her daughter for a follow-up visit. Overall no reported change in stamina or dyspnea on exertion which is NYHA class II with typical activities. She reports adequate control of leg edema. No palpitations or chest pain.  She continues to follow with Dr. Buelah [name]. I reviewed her recent lab work from October and November. We reviewed her medications. Cardiac regimen includes aspirin, Coreg, Norvasc, Plavix, Hyzaar, Crestor, and as needed nitroglycerin.  Carotid Dopplers from this summer showed patent left CEA site with mild atherosclerotic disease otherwise. She has been asymptomatic.  Echocardiogram from April of last year showed LVEF 60-65% with grade 1 diastolic dysfunction.  Past Medical History:  Diagnosis Date  . Breast cancer ([place])    Tamoxifen started 2007  . CAD (coronary artery disease)    DES second diagonal 2/12  . Cardiomyopathy    Probable Takotsubo, LVEF 30-35% 2/12  . Carotid artery disease ([place])   . Essential hypertension   . Hepatic steatosis   . Hyperlipidemia   . Hypothyroidism   . NSTEMI (non-ST elevated myocardial infarction) (Robin [name])    2/12  . Pancreas divisum   . Stroke (Robin [name])    (2000) residual left-sided weakness  . Type 2 diabetes mellitus (Robin [name])     Past Surgical History:  Procedure Laterality Date  . ABDOMINAL HYSTERECTOMY    . CAROTID ENDARTERECTOMY     Left  . CHOLECYSTECTOMY    . JOINT REPLACEMENT     Right [facility]- [facility]  . MASTECTOMY     Bilateral    Current Outpatient Medications  Medication Sig Dispense Refill  . amLODipine (NORVASC) 10 MG tablet Take 1 tablet (10 mg  total) by mouth daily. 90 tablet 2  . aspirin 81 MG tablet Take 81 mg by mouth daily.      . carvedilol (COREG) 12.5 MG tablet TAKE 1.5 TABLETS BY MOUTH TWICE DAILY 270 tablet 1  . clopidogrel (PLAVIX) 75 MG tablet Take 1 tablet (75 mg total) by mouth daily. 90 tablet 3  . docusate sodium (COLACE) 100 MG capsule Take 100 mg by mouth as needed for mild constipation.    Marland Kitchen HYDROcodone-acetaminophen (NORCO) 5-325 MG tablet Take 1.5 tablet to 1 full tablet twice daily as needed. 45 tablet 0  . losartan-hydrochlorothiazide (HYZAAR) 50-12.5 MG tablet Take 1 tablet by mouth daily. 90 tablet 2  . nitroGLYCERIN (NITROSTAT) 0.4 MG SL tablet Place 1 tablet (0.4 mg total) under the tongue every 5 (five) minutes x 3 doses as needed. 25 tablet 3  . Omega-3 Fatty Acids 300 MG CAPS Take 1 capsule (300 mg total) by mouth 2 (two) times daily.    . rosuvastatin (CRESTOR) 40 MG tablet Take 1 tablet (40 mg total) by mouth daily. 90 tablet 2  . SYNTHROID 100 MCG tablet TAKE 1 TABLET BY MOUTH EVERY DAY BEFORE BREAKFAST 30 tablet 3   No current facility-administered medications for this visit.    Allergies:  Codeine   Social History: The patient  reports that  has never smoked. she has never used smokeless tobacco. She reports that she does not drink alcohol or use drugs.  ROS:  Please see the history of present illness. Otherwise, complete review of systems is positive for none.  All other systems are reviewed and negative.   Physical Exam: VS:  BP (!) 168/82   Pulse 83   Ht _0  (1.676 m)   Wt 236 lb (107 kg)   SpO2 98%   BMI 38.09 kg/m , BMI Body mass index is 38.09 kg/m.  Wt Readings from Last 3 Encounters:  01/05/17 236 lb (107 kg)  11/17/16 232 lb (105.2 kg)  08/28/16 228 lb (103.4 kg)    General: Patient appears comfortable at rest. HEENT: Conjunctiva and lids normal, oropharynx clear. Neck: Supple, no elevated JVP, left CEA scar, no thyromegaly. Lungs: Clear to auscultation, nonlabored  breathing at rest. Cardiac: Regular rate and rhythm, no S3, 2/6 basal systolic murmur, no pericardial rub. Abdomen: Soft, nontender, bowel sounds present, no guarding or rebound. Extremities: Mild, stable lower leg edema, distal pulses 2+. Skin: Warm and dry. Musculoskeletal: No kyphosis. Neuropsychiatric: Alert and oriented x3, affect grossly appropriate.  ECG: I personally reviewed the tracing from 07/22/2016 which showed sinus bradycardia.  Recent Labwork: 11/17/2016: ALT 20; AST 24; BUN 26; Creat 1.19; Hemoglobin 11.9; Platelets 166; Potassium 4.2; Sodium 139 11/28/2016: TSH 0.35     Component Value Date/Time   CHOL 142 11/17/2016 0829   TRIG 91 11/17/2016 0829   HDL 52 11/17/2016 0829   CHOLHDL 2.7 11/17/2016 0829   VLDL 20 04/08/2016 0910   LDLCALC 72 04/08/2016 0910    Other Studies Reviewed Today:  Carotid Dopplers 07/31/2016: 1-39% bilateral ICA stenoses with patent left endarterectomy site.  Echocardiogram 04/27/2015: Study Conclusions  - Left ventricle: The cavity size was normal. Wall thickness was  increased in a pattern of severe LVH. Systolic function was  normal. The estimated ejection fraction was in the range of 60%  to 65%. Wall motion was normal; there were no regional wall  motion abnormalities. Doppler parameters are consistent with  abnormal left ventricular relaxation (grade 1 diastolic  dysfunction). Doppler parameters are consistent with high  ventricular filling pressure. - Aortic valve: Mildly calcified annulus. Trileaflet; mildly  calcified leaflets. - Mitral valve: Calcified annulus. There was trivial regurgitation. - Left atrium: The atrium was mildly dilated. - Right atrium: Central venous pressure (est): 3 mm Hg. - Tricuspid valve: There was trivial regurgitation. - Pulmonary arteries: PA peak pressure: 26 mm Hg (S). - Pericardium, extracardiac: There was no pericardial effusion.  Impressions:  - Severe LVH with LVEF 60-65%.  Grade 1 diastolic dysfunction with  increased LV filling pressures. Mild left atrial enlargement. MAC  with trivial mitral regurgitation. Mildly sclerotic aortic valve.  Trivial tricuspid regurgitation with PASP 26 mmHg mmHg.  Assessment and Plan:  1. CAD with history of DES to the second diagonal in 2012. She reports no angina and we continue with medical therapy and observation.  2. History of Tako-tsubo cardiomyopathy with normalization of LVEF. Echocardiogram from last year reviewed today. No change in current medical regimen.  3. Carotid artery disease status post left CEA, patent by recent carotid Dopplers this past summer and otherwise mild atherosclerotic disease. She remains on aspirin and statin.  4. Mixed hyperlipidemia, LDL 72.  Current medicines were reviewed with the patient today.  Disposition: Follow-up in 6 months.  Signed, Satira Sark, MD, [facility] 01/05/2017 9:13 AM    [facility], [place], [place] [address] Phone: [phone]; Fax: [phone]

## 2017-01-05 ENCOUNTER — Ambulatory Visit: Payer: Medicare PPO | Admitting: Cardiology

## 2017-01-05 ENCOUNTER — Encounter: Payer: Self-pay | Admitting: Cardiology

## 2017-01-05 VITALS — BP 168/82 | HR 83 | Ht 66.0 in | Wt 236.0 lb

## 2017-01-05 DIAGNOSIS — Z8679 Personal history of other diseases of the circulatory system: Secondary | ICD-10-CM | POA: Diagnosis not present

## 2017-01-05 DIAGNOSIS — E782 Mixed hyperlipidemia: Secondary | ICD-10-CM | POA: Diagnosis not present

## 2017-01-05 DIAGNOSIS — I2510 Atherosclerotic heart disease of native coronary artery without angina pectoris: Secondary | ICD-10-CM

## 2017-01-05 DIAGNOSIS — I6523 Occlusion and stenosis of bilateral carotid arteries: Secondary | ICD-10-CM

## 2017-01-05 NOTE — Patient Instructions (Signed)

## 2017-01-23 ENCOUNTER — Encounter: Payer: Self-pay | Admitting: "Endocrinology

## 2017-01-23 ENCOUNTER — Ambulatory Visit (INDEPENDENT_AMBULATORY_CARE_PROVIDER_SITE_OTHER): Payer: Medicare PPO | Admitting: "Endocrinology

## 2017-01-23 VITALS — BP 163/73 | HR 78 | Ht 66.0 in | Wt 235.0 lb

## 2017-01-23 DIAGNOSIS — R7303 Prediabetes: Secondary | ICD-10-CM

## 2017-01-23 DIAGNOSIS — E039 Hypothyroidism, unspecified: Secondary | ICD-10-CM

## 2017-01-23 MED ORDER — SYNTHROID 100 MCG PO TABS: ORAL_TABLET | ORAL | 6 refills | Status: DC

## 2017-01-23 NOTE — Progress Notes (Signed)
Subjective:    Patient ID: Robin [name], female    DOB: August 15, 1940, PCP Alycia Rossetti, MD   Past Medical History:  Diagnosis Date  . Breast cancer ([place])    Tamoxifen started 2007  . CAD (coronary artery disease)    DES second diagonal 2/12  . Cardiomyopathy    Probable Takotsubo, LVEF 30-35% 2/12  . Carotid artery disease ([place])   . Essential hypertension   . Hepatic steatosis   . Hyperlipidemia   . Hypothyroidism   . NSTEMI (non-ST elevated myocardial infarction) ([place])    2/12  . Pancreas divisum   . Stroke ([place])    (2000) residual left-sided weakness  . Type 2 diabetes mellitus ([place])    Past Surgical History:  Procedure Laterality Date  . ABDOMINAL HYSTERECTOMY    . CAROTID ENDARTERECTOMY     Left  . CHOLECYSTECTOMY    . JOINT REPLACEMENT     [facility]  . MASTECTOMY     Bilateral   Social History   Socioeconomic History  . Marital status: Widowed    Spouse name: None  . Number of children: None  . Years of education: None  . Highest education level: None  Social Needs  . Financial resource strain: None  . Food insecurity - worry: None  . Food insecurity - inability: None  . Transportation needs - medical: None  . Transportation needs - non-medical: None  Occupational History  . None  Tobacco Use  . Smoking status: Never Smoker  . Smokeless tobacco: Never Used  Substance and Sexual Activity  . Alcohol use: No    Alcohol/week: 0.0 oz  . Drug use: No  . Sexual activity: None  Other Topics Concern  . None  Social History Narrative  . None   Outpatient Encounter Medications as of 01/23/2017  Medication Sig  . amLODipine (NORVASC) 10 MG tablet Take 1 tablet (10 mg total) by mouth daily.  Marland Kitchen aspirin 81 MG tablet Take 81 mg by mouth daily.    . carvedilol (COREG) 12.5 MG tablet TAKE 1.5 TABLETS BY MOUTH TWICE DAILY  . clopidogrel (PLAVIX) 75 MG tablet Take 1 tablet (75 mg total) by mouth daily.  Marland Kitchen docusate  sodium (COLACE) 100 MG capsule Take 100 mg by mouth as needed for mild constipation.  Marland Kitchen HYDROcodone-acetaminophen (NORCO) 5-325 MG tablet Take 1.5 tablet to 1 full tablet twice daily as needed.  Marland Kitchen losartan-hydrochlorothiazide (HYZAAR) 50-12.5 MG tablet Take 1 tablet by mouth daily.  . nitroGLYCERIN (NITROSTAT) 0.4 MG SL tablet Place 1 tablet (0.4 mg total) under the tongue every 5 (five) minutes x 3 doses as needed.  . Omega-3 Fatty Acids 300 MG CAPS Take 1 capsule (300 mg total) by mouth 2 (two) times daily.  . rosuvastatin (CRESTOR) 40 MG tablet Take 1 tablet (40 mg total) by mouth daily.  Marland Kitchen SYNTHROID 100 MCG tablet TAKE 1 TABLET BY MOUTH EVERY DAY BEFORE BREAKFAST  . [DISCONTINUED] SYNTHROID 100 MCG tablet TAKE 1 TABLET BY MOUTH EVERY DAY BEFORE BREAKFAST   No facility-administered encounter medications on file as of 01/23/2017.    ALLERGIES: Allergies  Allergen Reactions  . Codeine Nausea And Vomiting    VACCINATION STATUS: Immunization History  Administered Date(s) Administered  . Influenza, High Dose Seasonal PF 11/17/2016  . Influenza,inj,Quad PF,6+ Mos 10/31/2014, 11/20/2015  . Pneumococcal Conjugate-13 12/01/2012    HPI 77 yr old female patient with medical hx as above. She is being  seen in follow-up for hypothyroidism requested by Dr. Buelah Manis. -she was kept on a lower dose of Synthroid 100 g by mouth every morning during her last visit. She continues to feel better, has no new complaints today.  She was diagnosed with hypothyroidism approximately at age 74 since when she was given various doses of thyroid hormone.  -  She  has steady weight.  She denies heat/cold intolerance. She denies ideations nor tremors.   She denies any history of goiter. She has family history of thyroid dysfunction in one of her daughters. She denies any family history of thyroid cancer. -She reports compliance to her thyroid hormone.  Review of Systems  Constitutional:  + Steady weight , no  fatigue, no subjective hyperthermia, no subjective hypothermia Eyes: no blurry vision, no xerophthalmia ENT: no sore throat, no nodules palpated in throat, no dysphagia/odynophagia, no hoarseness Cardiovascular: no Chest Pain, no Shortness of Breath, no palpitations, no leg swelling Respiratory: no cough, no SOB Gastrointestinal: no Nausea/Vomiting/Diarhhea Musculoskeletal: no muscle/joint aches Skin: no rashes Neurological: no tremors, no numbness, no tingling, no dizziness Psychiatric: no depression, no anxiety  Objective:    BP (!) 163/73   Pulse 78   Ht 5\' 6"  (1.676 m)   Wt 235 lb (106.6 kg)   BMI 37.93 kg/m   Wt Readings from Last 3 Encounters:  01/23/17 235 lb (106.6 kg)  01/05/17 236 lb (107 kg)  11/17/16 232 lb (105.2 kg)    Physical Exam  Constitutional:  +  obese,  not in acute distress,  normal state of mind Eyes: PERRLA, EOMI, no exophthalmos ENT: +  moist mucous membranes,   no thyromegaly , no cervical lymphadenopathy Cardiovascular: normal precordial activity, Regular Rate and Rhythm, no Murmur/Rubs/Gallops Respiratory:  adequate breathing efforts, no gross chest deformity, Clear to auscultation bilaterally Gastrointestinal: abdomen soft, Non -tender, No distension, Bowel Sounds present Musculoskeletal: no gross deformities,  Skin:  Moist, warm, no rashes  Neurological: no tremor with outstretched hands, Deep tendon reflexes normal in all four extremities.  CMP ( most recent) CMP     Component Value Date/Time   NA 139 11/17/2016 0829   K 4.2 11/17/2016 0829   CL 106 11/17/2016 0829   CO2 23 11/17/2016 0829   GLUCOSE 114 (H) 11/17/2016 0829   BUN 26 (H) 11/17/2016 0829   CREATININE 1.19 (H) 11/17/2016 0829   CALCIUM 9.8 11/17/2016 0829   PROT 7.2 11/17/2016 0829   ALBUMIN 4.2 04/08/2016 0910   AST 24 11/17/2016 0829   ALT 20 11/17/2016 0829   ALKPHOS 92 04/08/2016 0910   BILITOT 0.6 11/17/2016 0829   GFRNONAA 58 (L) 04/27/2015 0408   GFRNONAA 46  (L) 05/03/2012 1015   GFRAA >60 04/27/2015 0408   GFRAA 54 (L) 05/03/2012 1015     Diabetic Labs (most recent): Lab Results  Component Value Date   HGBA1C 5.6 04/08/2016   HGBA1C 6.2 (H) 07/17/2015   HGBA1C 6.2 (H) 03/05/2015     Lipid Panel ( most recent) Lipid Panel     Component Value Date/Time   CHOL 142 11/17/2016 0829   TRIG 91 11/17/2016 0829   HDL 52 11/17/2016 0829   CHOLHDL 2.7 11/17/2016 0829   VLDL 20 04/08/2016 0910   LDLCALC 72 04/08/2016 0910   Results for KIRAT, MEZQUITA (MRN [id number]) as of 01/23/2017 11:14  Ref. Range 07/18/2016 09:56 11/28/2016 08:34  TSH Latest Ref Range: 0.40 - 4.50 mIU/L 0.02 (L) 0.35 (L)  Triiodothyronine,Free,Serum Latest Ref Range: 2.3 -  4.2 pg/mL 2.7 2.7  T4,Free(Direct) Latest Ref Range: 0.8 - 1.8 ng/dL 1.6 1.3  Thyroglobulin Ab Latest Ref Range: < or = 1 IU/mL  <1  Thyroperoxidase Ab SerPl-aCnc Latest Ref Range: <9 IU/mL  1      Assessment & Plan:   1. Hypothyroidism 2. History of prediabetes - Her repeat thyroid function tests are consistent with appropriate replacement at this time.  - high advised her to continue Synthroid 100 g by mouth every morning.  - We discussed about correct intake of levothyroxine, at fasting, with water, separated by at least 30 minutes from breakfast, and separated by more than 4 hours from calcium, iron, multivitamins, acid reflux medications (PPIs). -Patient is made aware of the fact that thyroid hormone replacement is needed for life, dose to be adjusted by periodic monitoring of thyroid function tests.  Regarding her history of prediabetes: Her last A1c was from March 2018 at which time it was 5.6%. She is not on any medication intervention. I will include  A1c along with her next thyroid function test.   - I advised patient to maintain close follow up with Alycia Rossetti, MD for primary care needs. Follow up plan: Return in about 6 months (around 07/23/2017) for follow up with pre-visit  labs.  Glade Lloyd, MD Phone: [phone]  Fax: [phone]   01/23/2017, 11:48 AM

## 2017-03-12 DIAGNOSIS — E669 Obesity, unspecified: Secondary | ICD-10-CM | POA: Diagnosis not present

## 2017-03-12 DIAGNOSIS — E039 Hypothyroidism, unspecified: Secondary | ICD-10-CM | POA: Diagnosis not present

## 2017-03-12 DIAGNOSIS — I69354 Hemiplegia and hemiparesis following cerebral infarction affecting left non-dominant side: Secondary | ICD-10-CM | POA: Diagnosis not present

## 2017-03-12 DIAGNOSIS — Z853 Personal history of malignant neoplasm of breast: Secondary | ICD-10-CM | POA: Diagnosis not present

## 2017-03-12 DIAGNOSIS — E785 Hyperlipidemia, unspecified: Secondary | ICD-10-CM | POA: Diagnosis not present

## 2017-03-12 DIAGNOSIS — I252 Old myocardial infarction: Secondary | ICD-10-CM | POA: Diagnosis not present

## 2017-03-12 DIAGNOSIS — M159 Polyosteoarthritis, unspecified: Secondary | ICD-10-CM | POA: Diagnosis not present

## 2017-03-12 DIAGNOSIS — I10 Essential (primary) hypertension: Secondary | ICD-10-CM | POA: Diagnosis not present

## 2017-03-12 DIAGNOSIS — Z6833 Body mass index (BMI) 33.0-33.9, adult: Secondary | ICD-10-CM | POA: Diagnosis not present

## 2017-03-20 ENCOUNTER — Ambulatory Visit: Payer: Medicare PPO | Admitting: Family Medicine

## 2017-03-27 ENCOUNTER — Ambulatory Visit (INDEPENDENT_AMBULATORY_CARE_PROVIDER_SITE_OTHER): Payer: Medicare PPO | Admitting: Family Medicine

## 2017-03-27 ENCOUNTER — Other Ambulatory Visit: Payer: Self-pay

## 2017-03-27 ENCOUNTER — Encounter: Payer: Self-pay | Admitting: Family Medicine

## 2017-03-27 VITALS — BP 148/72 | HR 78 | Temp 98.9°F | Resp 12 | Ht 66.0 in | Wt 231.0 lb

## 2017-03-27 DIAGNOSIS — I2510 Atherosclerotic heart disease of native coronary artery without angina pectoris: Secondary | ICD-10-CM | POA: Diagnosis not present

## 2017-03-27 DIAGNOSIS — J3489 Other specified disorders of nose and nasal sinuses: Secondary | ICD-10-CM

## 2017-03-27 DIAGNOSIS — E782 Mixed hyperlipidemia: Secondary | ICD-10-CM

## 2017-03-27 DIAGNOSIS — I10 Essential (primary) hypertension: Secondary | ICD-10-CM | POA: Diagnosis not present

## 2017-03-27 DIAGNOSIS — M1611 Unilateral primary osteoarthritis, right hip: Secondary | ICD-10-CM | POA: Diagnosis not present

## 2017-03-27 MED ORDER — HYDROCODONE-ACETAMINOPHEN 5-325 MG PO TABS: ORAL_TABLET | ORAL | 0 refills | Status: DC

## 2017-03-27 NOTE — Assessment & Plan Note (Signed)
Multiple joints involved with her osteoarthritis.  She uses hydrocodone sparingly.  We will refill today last done in September

## 2017-03-27 NOTE — Assessment & Plan Note (Signed)
Doing well from a cardiac standpoint.  Her blood pressure is at her baseline.  No change to her medications.  Reviewed the cardiology note.  Her lipids are at goal done in October we will repeat this at her physical during the summer. No change in medications.

## 2017-03-27 NOTE — Patient Instructions (Addendum)
[place] - salt water spray  Take anti- histamine -Xyzal F/U 4 months PHYSICAL

## 2017-03-27 NOTE — Progress Notes (Signed)
   Subjective:    Patient ID: Robin [name], female    DOB: December 14, 1940, 77 y.o.   MRN: [id number]  Patient presents for Follow-up (is fastng)      She here to follow-up chronic medical problems.  Her only concern is some sinus congestion mild headache that she has had for the past few days.  No fever only small amount of drainage.  She has not had any cough body aches.  No change in GI symptoms.  She has not taken anything OTC  Reviewed her last endocrinology note her Synthroid is now on 100 mcg  B cardiology note for her coronary artery disease cardiomyopathy things have been stable.  Her blood pressure is always elevated some when she sees cardiology but there are no changes to her medication.  She has not had any dizzy spells states that she feels quite well.   Daughter with her today    Review Of Systems:  GEN- denies fatigue, fever, weight loss,weakness, recent illness HEENT- denies eye drainage, change in vision,+ nasal discharge, CVS- denies chest pain, palpitations RESP- denies SOB, cough, wheeze ABD- denies N/V, change in stools, abd pain GU- denies dysuria, hematuria, dribbling, incontinence MSK- denies joint pain, muscle aches, injury Neuro- denies headache, dizziness, syncope, seizure activity       Objective:    BP (!) 148/72   Pulse 78   Temp 98.9 F (37.2 C) (Oral)   Resp 12   Ht 5\' 6"  (1.676 m)   Wt 231 lb (104.8 kg)   SpO2 98%   BMI 37.28 kg/m  GEN- NAD, alert and oriented x3 HEENT- PERRL, EOMI, non injected sclera, pink conjunctiva, MMM, oropharynx clear, nares clear rhinorrhea, TM clear no effusion  Neck- Supple, no thyromegaly, no LAD  CVS- RRR, 2/6 SEM RESP-CTAB ABD-NABS,soft,NT,ND EXT- chronic pedal edema R>L Pulses- Radial 2+ DP1+      Assessment & Plan:      Problem List Items Addressed This Visit      Unprioritized   Mixed hyperlipidemia   Essential hypertension, benign   OA (osteoarthritis) of hip    Multiple joints involved  with her osteoarthritis.  She uses hydrocodone sparingly.  We will refill today last done in September      Relevant Medications   HYDROcodone-acetaminophen (NORCO) 5-325 MG tablet   Coronary atherosclerosis of native coronary artery    Doing well from a cardiac standpoint.  Her blood pressure is at her baseline.  No change to her medications.  Reviewed the cardiology note.  Her lipids are at goal done in October we will repeat this at her physical during the summer. No change in medications.       Other Visit Diagnoses    Sinus pressure    -  Primary   No sign of overt sinus infection, use nasal saline, given sample of anti-histamine xyzal advised may cause some drowsiness, no use of decongestants due to heart, ifsymptoms worsen they will call      Note: This dictation was prepared with Dragon dictation along with smaller phrase technology. Any transcriptional errors that result from this process are unintentional.

## 2017-04-16 DIAGNOSIS — N390 Urinary tract infection, site not specified: Secondary | ICD-10-CM | POA: Diagnosis not present

## 2017-04-23 ENCOUNTER — Other Ambulatory Visit: Payer: Self-pay | Admitting: "Endocrinology

## 2017-05-12 ENCOUNTER — Other Ambulatory Visit: Payer: Self-pay | Admitting: Family Medicine

## 2017-05-27 ENCOUNTER — Other Ambulatory Visit: Payer: Self-pay | Admitting: Family Medicine

## 2017-07-12 NOTE — Progress Notes (Signed)
Cardiology Office Note  Date: 07/13/2017   ID: Robin APPELHANS, DOB 1940/08/30, MRN [id number]  PCP: Alycia Rossetti, MD  Primary Cardiologist: Rozann Lesches, MD   Chief Complaint  Patient presents with  . History of cardiomyopathy    History of Present Illness: Robin [name] is a 77 y.o. female last seen in December 2018.  She is here today for a follow-up visit.  She states that she has been feeling well overall.  No angina symptoms or increasing shortness of breath beyond NYHA class II.  No palpitations or syncope.  She has been functional with ADLs around the house and enjoys working in her flowers outside.  I personally reviewed her ECG today which shows a sinus bradycardia with PAC.  I reviewed her medications which are stable from a cardiac perspective.  She continues to follow regularly with Dr. Buelah Manis.  I reviewed her interval lab work.  Past Medical History:  Diagnosis Date  . Breast cancer ([place])    Tamoxifen started 2007  . CAD (coronary artery disease)    DES second diagonal 2/12  . Cardiomyopathy    Probable Takotsubo, LVEF 30-35% 2/12  . Carotid artery disease ([place])   . Essential hypertension   . Hepatic steatosis   . Hyperlipidemia   . Hypothyroidism   . NSTEMI (non-ST elevated myocardial infarction) ([place])    2/12  . Pancreas divisum   . Stroke ([place])    (2000) residual left-sided weakness  . Type 2 diabetes mellitus ([place])     Past Surgical History:  Procedure Laterality Date  . ABDOMINAL HYSTERECTOMY    . CAROTID ENDARTERECTOMY     Left  . CHOLECYSTECTOMY    . JOINT REPLACEMENT     [facility]  . MASTECTOMY     Bilateral    Current Outpatient Medications  Medication Sig Dispense Refill  . amLODipine (NORVASC) 10 MG tablet TAKE 1 TABLET BY MOUTH ONCE DAILY 90 tablet 2  . aspirin 81 MG tablet Take 81 mg by mouth daily.      . carvedilol (COREG) 12.5 MG tablet TAKE 1.5 TABLETS BY MOUTH TWICE DAILY 270  tablet 1  . clopidogrel (PLAVIX) 75 MG tablet TAKE 1 TABLET BY MOUTH ONCE DAILY 90 tablet 1  . docusate sodium (COLACE) 100 MG capsule Take 100 mg by mouth as needed for mild constipation.    Marland Kitchen HYDROcodone-acetaminophen (NORCO) 5-325 MG tablet Take 1.5 tablet to 1 full tablet twice daily as needed. 45 tablet 0  . losartan-hydrochlorothiazide (HYZAAR) 50-12.5 MG tablet Take 1 tablet by mouth daily. 90 tablet 2  . nitroGLYCERIN (NITROSTAT) 0.4 MG SL tablet Place 1 tablet (0.4 mg total) under the tongue every 5 (five) minutes x 3 doses as needed. 25 tablet 3  . Omega-3 Fatty Acids 300 MG CAPS Take 1 capsule (300 mg total) by mouth 2 (two) times daily.    . rosuvastatin (CRESTOR) 40 MG tablet Take 1 tablet (40 mg total) by mouth daily. 90 tablet 2  . SYNTHROID 100 MCG tablet TAKE 1 TABLET BY MOUTH EVERY DAY BEFORE BREAKFAST 30 tablet 3   No current facility-administered medications for this visit.    Allergies:  Codeine   Social History: The patient  reports that she has never smoked. She has never used smokeless tobacco. She reports that she does not drink alcohol or use drugs.   ROS:  Please see the history of present illness. Otherwise, complete review of systems  is positive for none.  All other systems are reviewed and negative.   Physical Exam: VS:  BP (!) 152/85   Pulse 61   Ht _0  (1.676 m)   Wt 231 lb 9.6 oz (105.1 kg)   SpO2 99%   BMI 37.38 kg/m , BMI Body mass index is 37.38 kg/m.  Wt Readings from Last 3 Encounters:  07/13/17 231 lb 9.6 oz (105.1 kg)  03/27/17 231 lb (104.8 kg)  01/23/17 235 lb (106.6 kg)    General: Patient appears comfortable at rest. HEENT: Conjunctiva and lids normal, oropharynx clear. Neck: Supple, no elevated JVP, left CEA scar, no thyromegaly. Lungs: Clear to auscultation, nonlabored breathing at rest. Cardiac: Regular rate and rhythm, no S3, 2/6 systolic murmur, no pericardial rub. Abdomen: Soft, nontender, bowel sounds present, no guarding or  rebound. Extremities: Mild leg edema and adipose tissue, distal pulses 2+. Skin: Warm and dry. Musculoskeletal: No kyphosis. Neuropsychiatric: Alert and oriented x3, affect grossly appropriate.  ECG: I personally reviewed the tracing from 07/22/2016 which showed sinus bradycardia.  Recent Labwork: 11/17/2016: ALT 20; AST 24; BUN 26; Creat 1.19; Hemoglobin 11.9; Platelets 166; Potassium 4.2; Sodium 139 11/28/2016: TSH 0.35     Component Value Date/Time   CHOL 142 11/17/2016 0829   TRIG 91 11/17/2016 0829   HDL 52 11/17/2016 0829   CHOLHDL 2.7 11/17/2016 0829   VLDL 20 04/08/2016 0910   LDLCALC 73 11/17/2016 0829    Other Studies Reviewed Today:  Carotid Dopplers 07/31/2016: 1-39% bilateral ICA stenoses with patent left endarterectomy site.  Echocardiogram 04/27/2015: Study Conclusions  - Left ventricle: The cavity size was normal. Wall thickness was  increased in a pattern of severe LVH. Systolic function was  normal. The estimated ejection fraction was in the range of 60%  to 65%. Wall motion was normal; there were no regional wall  motion abnormalities. Doppler parameters are consistent with  abnormal left ventricular relaxation (grade 1 diastolic  dysfunction). Doppler parameters are consistent with high  ventricular filling pressure. - Aortic valve: Mildly calcified annulus. Trileaflet; mildly  calcified leaflets. - Mitral valve: Calcified annulus. There was trivial regurgitation. - Left atrium: The atrium was mildly dilated. - Right atrium: Central venous pressure (est): 3 mm Hg. - Tricuspid valve: There was trivial regurgitation. - Pulmonary arteries: PA peak pressure: 26 mm Hg (S). - Pericardium, extracardiac: There was no pericardial effusion.  Impressions:  - Severe LVH with LVEF 60-65%. Grade 1 diastolic dysfunction with  increased LV filling pressures. Mild left atrial enlargement. MAC  with trivial mitral regurgitation. Mildly sclerotic aortic  valve.  Trivial tricuspid regurgitation with PASP 26 mmHg mmHg.  Assessment and Plan:  1.  History of Tako-tsubo cardiomyopathy with normalization of LVEF on medical therapy.  Echocardiogram from 2017 is outlined above.  She has had no change in clinical status, would continue with medical management, no clear indication for follow-up echocardiogram at this time.  ECG reviewed.  2.  Carotid artery disease status post left CEA.  She had only 1 to 39% ICA stenosis as of July 2018 with patent left CEA site.  Continue antiplatelet regimen and statin.  Anticipate follow-up carotid Dopplers next year.  3.  Essential hypertension, continue with current regimen and follow-up with Dr. Buelah Manis.  4.  Mixed hyperlipidemia on Crestor.  Last LDL 73.  5.  History of CAD with DES to the second diagonal branch in 2012.  No active angina symptoms, would continue with medical therapy and observation.  Current medicines  were reviewed with the patient today.   Orders Placed This Encounter  Procedures  . EKG 12-Lead    Disposition: Follow-up in 6 months.  Signed, Satira Sark, MD, [facility] 07/13/2017 9:14 AM    [facility] [address], [place], [place] [address] Phone: [phone]; Fax: [phone]

## 2017-07-13 ENCOUNTER — Ambulatory Visit: Payer: Medicare PPO | Admitting: Cardiology

## 2017-07-13 ENCOUNTER — Encounter: Payer: Self-pay | Admitting: Cardiology

## 2017-07-13 VITALS — BP 152/85 | HR 61 | Ht 66.0 in | Wt 231.6 lb

## 2017-07-13 DIAGNOSIS — I10 Essential (primary) hypertension: Secondary | ICD-10-CM

## 2017-07-13 DIAGNOSIS — Z8679 Personal history of other diseases of the circulatory system: Secondary | ICD-10-CM | POA: Diagnosis not present

## 2017-07-13 DIAGNOSIS — E782 Mixed hyperlipidemia: Secondary | ICD-10-CM

## 2017-07-13 DIAGNOSIS — I25119 Atherosclerotic heart disease of native coronary artery with unspecified angina pectoris: Secondary | ICD-10-CM

## 2017-07-13 DIAGNOSIS — I6523 Occlusion and stenosis of bilateral carotid arteries: Secondary | ICD-10-CM

## 2017-07-13 NOTE — Patient Instructions (Signed)

## 2017-07-19 ENCOUNTER — Other Ambulatory Visit: Payer: Self-pay | Admitting: "Endocrinology

## 2017-07-22 ENCOUNTER — Ambulatory Visit: Payer: Medicare PPO | Admitting: "Endocrinology

## 2017-07-24 ENCOUNTER — Ambulatory Visit: Payer: Medicare PPO | Admitting: "Endocrinology

## 2017-07-24 ENCOUNTER — Other Ambulatory Visit: Payer: Self-pay | Admitting: Family Medicine

## 2017-07-29 ENCOUNTER — Encounter: Payer: Medicare PPO | Admitting: Family Medicine

## 2017-07-30 ENCOUNTER — Encounter: Payer: Self-pay | Admitting: Family Medicine

## 2017-08-05 ENCOUNTER — Other Ambulatory Visit: Payer: Self-pay

## 2017-08-05 MED ORDER — LEVOTHYROXINE SODIUM 100 MCG PO TABS: ORAL_TABLET | ORAL | 0 refills | Status: DC

## 2017-08-31 ENCOUNTER — Other Ambulatory Visit: Payer: Self-pay | Admitting: "Endocrinology

## 2017-08-31 ENCOUNTER — Ambulatory Visit (INDEPENDENT_AMBULATORY_CARE_PROVIDER_SITE_OTHER): Payer: Medicare PPO | Admitting: Physician Assistant

## 2017-08-31 ENCOUNTER — Encounter: Payer: Self-pay | Admitting: Physician Assistant

## 2017-08-31 ENCOUNTER — Other Ambulatory Visit: Payer: Self-pay

## 2017-08-31 VITALS — BP 150/98 | HR 56 | Temp 98.0°F | Resp 14 | Ht 66.5 in | Wt 228.0 lb

## 2017-08-31 DIAGNOSIS — E559 Vitamin D deficiency, unspecified: Secondary | ICD-10-CM | POA: Diagnosis not present

## 2017-08-31 DIAGNOSIS — I10 Essential (primary) hypertension: Secondary | ICD-10-CM | POA: Diagnosis not present

## 2017-08-31 DIAGNOSIS — Z0000 Encounter for general adult medical examination without abnormal findings: Secondary | ICD-10-CM

## 2017-08-31 DIAGNOSIS — E782 Mixed hyperlipidemia: Secondary | ICD-10-CM

## 2017-08-31 DIAGNOSIS — R739 Hyperglycemia, unspecified: Secondary | ICD-10-CM | POA: Diagnosis not present

## 2017-08-31 DIAGNOSIS — E039 Hypothyroidism, unspecified: Secondary | ICD-10-CM | POA: Diagnosis not present

## 2017-08-31 NOTE — Progress Notes (Signed)
Subjective:   Patient presents for Medicare Annual/Subsequent preventive examination.   Review Past Medical/Family/Social:  This information is reviewed today. Her daughter accompanies her for visit today. They report to the patient's granddaughter lives with patient.   This daughter lives nearby. Patient states that she "never drove".   States that family members take her to the grocery store etc.  States that she is "able to do what ever she wants to do ".   Spends most of her time cleaning, cooking, watching TV.  Does no formal exercise.  Risk Factors  Current exercise habits: Has no formal exercise.  Activity level is cleaning cooking and watching TV.  She does go to grocery store. Dietary issues discussed: She has been educated on low-cholesterol low sodium low carbohydrate diet.  Cardiac risk factors: She sees cardiology routinely.  She is on medication for hyperlipidemia.  Medication for hypertension.  Hyperglycemia is being monitored.  Depression Screen  (Note: if answer to either of the following is "Yes", a more complete depression screening is indicated)  Over the past two weeks, have you felt down, depressed or hopeless? No Over the past two weeks, have you felt little interest or pleasure in doing things? No Have you lost interest or pleasure in daily life? No Do you often feel hopeless? No Do you cry easily over simple problems? No   Activities of Daily Living  In your present state of health, do you have any difficulty performing the following activities?:  Driving? No  Managing money? No  Feeding yourself? No  Getting from bed to chair? No  Climbing a flight of stairs? No  Preparing food and eating?: No  Bathing or showering? No  Getting dressed: No  Getting to the toilet? No  Using the toilet:No  Moving around from place to place: No  In the past year have you fallen or had a near fall?:No  Are you sexually active? No  Do you have more than one partner? No    Hearing Difficulties: No  Do you often ask people to speak up or repeat themselves? No  Do you experience ringing or noises in your ears? No Do you have difficulty understanding soft or whispered voices? No  Do you feel that you have a problem with memory? No Do you often misplace items? No  Do you feel safe at home? Yes  Cognitive Testing  Alert? Yes Normal Appearance?Yes  Oriented to person? Yes Place? Yes  Time? Yes  Recall of three objects? Yes  Can perform simple calculations? Yes  Displays appropriate judgment?Yes  Can read the correct time from a watch face?Yes   List the Names of Other Physician/Practitioners you currently use:  She sees Dr. Dorris Fetch -- Endocrinology for thyroid. She sees [name] for Cardiology. She states that these are the only specialists that she sees.  Indicate any recent Medical Services you may have received from other than [facility] providers in the past year (date may be approximate).  She sees Dr. Dorris Fetch -- Endocrinology for thyroid. She sees [name] for Cardiology. She states that these are the only specialists that she sees.  Screening Tests / Date Colonoscopy ---------------- she is age 77.  She defers any further colorectal cancer screening.                    Mammogram --------------- she reports she has a history of breast cancer and has had bilateral mastectomy.     Assessment:    Annual  wellness medicare exam   Plan:    Medicare Attestation  I have personally reviewed:  The patient's medical and social history  Their use of alcohol, tobacco or illicit drugs  Their current medications and supplements  The patient's functional ability including ADLs,fall risks, home safety risks, cognitive, and hearing and visual impairment  Diet and physical activities  No evidence for depression or mood disorders  The patient's weight, height, BMI  have been recorded in the chart.  1. Medicare annual wellness visit, subsequent See Note  above  2. Essential hypertension, benign Blood pressure is stable.  Will check lab to monitor. - COMPLETE METABOLIC PANEL WITH GFR  3. Hypothyroidism, unspecified type Managed by endocrinology/Dr. Dorris Fetch.  Therefore I will not recheck this lab as he is monitoring this.  4. Mixed hyperlipidemia She is on Crestor.  She is fasting.  Last FLP, CME T was 10/2016.  Recheck lab to monitor. - COMPLETE METABOLIC PANEL WITH GFR - Lipid panel  5. Hyperglycemia At prior lab 10/2016 -- cmet showed high glucose.  Will recheck that lab as well as A1c. - COMPLETE METABOLIC PANEL WITH GFR - Hemoglobin A1c

## 2017-09-01 LAB — LIPID PANEL
CHOLESTEROL: 126 mg/dL (ref <200)
HDL: 40 mg/dL — AB (ref >50)
LDL Cholesterol (Calc): 67 mg/dL (calc)
Non-HDL Cholesterol (Calc): 86 mg/dL (calc) (ref <130)
TRIGLYCERIDES: 102 mg/dL (ref <150)
Total CHOL/HDL Ratio: 3.2 (calc) (ref <5.0)

## 2017-09-01 LAB — COMPLETE METABOLIC PANEL WITH GFR
AG RATIO: 1.3 (calc) (ref 1.0–2.5)
ALKALINE PHOSPHATASE (APISO): 85 U/L (ref 33–130)
ALT: 15 U/L (ref 6–29)
AST: 25 U/L (ref 10–35)
Albumin: 4.4 g/dL (ref 3.6–5.1)
BUN/Creatinine Ratio: 18 (calc) (ref 6–22)
BUN: 22 mg/dL (ref 7–25)
CHLORIDE: 103 mmol/L (ref 98–110)
CO2: 24 mmol/L (ref 20–32)
Calcium: 10 mg/dL (ref 8.6–10.4)
Creat: 1.25 mg/dL — ABNORMAL HIGH (ref 0.60–0.93)
GFR, Est African American: 47 mL/min/{1.73_m2} — ABNORMAL LOW (ref >=60)
GFR, Est Non African American: 41 mL/min/{1.73_m2} — ABNORMAL LOW (ref >=60)
GLOBULIN: 3.4 g/dL (ref 1.9–3.7)
Glucose, Bld: 101 mg/dL — ABNORMAL HIGH (ref 65–99)
POTASSIUM: 3.8 mmol/L (ref 3.5–5.3)
SODIUM: 139 mmol/L (ref 135–146)
Total Bilirubin: 0.7 mg/dL (ref 0.2–1.2)
Total Protein: 7.8 g/dL (ref 6.1–8.1)

## 2017-09-01 LAB — VITAMIN D 25 HYDROXY (VIT D DEFICIENCY, FRACTURES): Vit D, 25-Hydroxy: 19 ng/mL — ABNORMAL LOW (ref 30–100)

## 2017-09-01 LAB — HEMOGLOBIN A1C
Hgb A1c MFr Bld: 6.1 % of total Hgb — ABNORMAL HIGH (ref <5.7)
MEAN PLASMA GLUCOSE: 128 (calc)
eAG (mmol/L): 7.1 (calc)

## 2017-09-01 LAB — TSH: TSH: 0.32 mIU/L — ABNORMAL LOW (ref 0.40–4.50)

## 2017-09-01 LAB — T4, FREE: Free T4: 1.5 ng/dL (ref 0.8–1.8)

## 2017-09-08 ENCOUNTER — Other Ambulatory Visit: Payer: Self-pay | Admitting: *Deleted

## 2017-09-08 MED ORDER — HYDROCODONE-ACETAMINOPHEN 5-325 MG PO TABS: ORAL_TABLET | ORAL | 0 refills | Status: DC

## 2017-09-08 NOTE — Telephone Encounter (Signed)
Received call from patient daughter, [name].   Requested refill on Hydrocodone/APAP.   Ok to refill??  Last office visit 08/31/2017.  Last refill 03/27/2017.

## 2017-10-07 ENCOUNTER — Other Ambulatory Visit: Payer: Self-pay | Admitting: Family Medicine

## 2017-10-12 ENCOUNTER — Ambulatory Visit: Payer: Medicare PPO | Admitting: "Endocrinology

## 2017-10-15 ENCOUNTER — Other Ambulatory Visit: Payer: Self-pay | Admitting: Family Medicine

## 2017-10-22 ENCOUNTER — Encounter: Payer: Self-pay | Admitting: "Endocrinology

## 2017-10-22 ENCOUNTER — Ambulatory Visit: Payer: Medicare PPO | Admitting: "Endocrinology

## 2017-10-22 ENCOUNTER — Ambulatory Visit (INDEPENDENT_AMBULATORY_CARE_PROVIDER_SITE_OTHER): Payer: Medicare PPO | Admitting: "Endocrinology

## 2017-10-22 VITALS — BP 152/89 | HR 72 | Ht 66.0 in | Wt 225.0 lb

## 2017-10-22 DIAGNOSIS — E039 Hypothyroidism, unspecified: Secondary | ICD-10-CM

## 2017-10-22 DIAGNOSIS — E559 Vitamin D deficiency, unspecified: Secondary | ICD-10-CM | POA: Insufficient documentation

## 2017-10-22 MED ORDER — VITAMIN D3 125 MCG (5000 UT) PO CAPS: 5000.0000 [IU] | ORAL_CAPSULE | Freq: Every day | ORAL | 0 refills | Status: DC

## 2017-10-22 MED ORDER — LEVOTHYROXINE SODIUM 100 MCG PO TABS: ORAL_TABLET | ORAL | 2 refills | Status: DC

## 2017-10-22 NOTE — Progress Notes (Signed)
Endocrinology follow-up note   Subjective:    Patient ID: Robin [name], female    DOB: 02-17-1940, PCP Alycia Rossetti, MD   Past Medical History:  Diagnosis Date  . Breast cancer ([place])    Tamoxifen started 2007  . CAD (coronary artery disease)    DES second diagonal 2/12  . Cardiomyopathy    Probable Takotsubo, LVEF 30-35% 2/12  . Carotid artery disease ([place])   . Essential hypertension   . Hepatic steatosis   . Hyperlipidemia   . Hypothyroidism   . NSTEMI (non-ST elevated myocardial infarction) ([place])    2/12  . Pancreas divisum   . Stroke ([place])    (2000) residual left-sided weakness  . Type 2 diabetes mellitus ([place])    Past Surgical History:  Procedure Laterality Date  . ABDOMINAL HYSTERECTOMY    . CAROTID ENDARTERECTOMY     Left  . CHOLECYSTECTOMY    . JOINT REPLACEMENT     [facility]  . MASTECTOMY     Bilateral   Social History   Socioeconomic History  . Marital status: Widowed    Spouse name: Not on file  . Number of children: Not on file  . Years of education: Not on file  . Highest education level: Not on file  Occupational History  . Not on file  Social Needs  . Financial resource strain: Not on file  . Food insecurity:    Worry: Not on file    Inability: Not on file  . Transportation needs:    Medical: Not on file    Non-medical: Not on file  Tobacco Use  . Smoking status: Never Smoker  . Smokeless tobacco: Never Used  Substance and Sexual Activity  . Alcohol use: No    Alcohol/week: 0.0 standard drinks  . Drug use: No  . Sexual activity: Not on file  Lifestyle  . Physical activity:    Days per week: Not on file    Minutes per session: Not on file  . Stress: Not on file  Relationships  . Social connections:    Talks on phone: Not on file    Gets together: Not on file    Attends religious service: Not on file    Active member of club or organization: Not on file    Attends meetings of clubs  or organizations: Not on file    Relationship status: Not on file  Other Topics Concern  . Not on file  Social History Narrative  . Not on file   Outpatient Encounter Medications as of 10/22/2017  Medication Sig  . amLODipine (NORVASC) 10 MG tablet TAKE 1 TABLET BY MOUTH ONCE DAILY  . aspirin 81 MG tablet Take 81 mg by mouth daily.    . carvedilol (COREG) 12.5 MG tablet TAKE 1.5 TABLETS BY MOUTH TWICE DAILY  . Cholecalciferol (VITAMIN D3) 5000 units CAPS Take 1 capsule (5,000 Units total) by mouth daily.  . clopidogrel (PLAVIX) 75 MG tablet TAKE 1 TABLET BY MOUTH ONCE DAILY  . docusate sodium (COLACE) 100 MG capsule Take 100 mg by mouth as needed for mild constipation.  Marland Kitchen HYDROcodone-acetaminophen (NORCO) 5-325 MG tablet Take 1.5 tablet to 1 full tablet twice daily as needed.  Marland Kitchen levothyroxine (SYNTHROID) 100 MCG tablet TAKE 1 TABLET BY MOUTH EVERY DAY BEFORE BREAKFAST  . losartan-hydrochlorothiazide (HYZAAR) 50-12.5 MG tablet TAKE 1 TABLET BY MOUTH ONCE DAILY  . nitroGLYCERIN (NITROSTAT) 0.4 MG SL tablet Place 1 tablet (  0.4 mg total) under the tongue every 5 (five) minutes x 3 doses as needed.  . Omega-3 Fatty Acids 300 MG CAPS Take 1 capsule (300 mg total) by mouth 2 (two) times daily.  . rosuvastatin (CRESTOR) 40 MG tablet TAKE 1 TABLET BY MOUTH ONCE DAILY  . [DISCONTINUED] levothyroxine (SYNTHROID) 100 MCG tablet TAKE 1 TABLET BY MOUTH EVERY DAY BEFORE BREAKFAST   No facility-administered encounter medications on file as of 10/22/2017.    ALLERGIES: Allergies  Allergen Reactions  . Codeine Nausea And Vomiting    VACCINATION STATUS: Immunization History  Administered Date(s) Administered  . Influenza, High Dose Seasonal PF 11/17/2016  . Influenza,inj,Quad PF,6+ Mos 10/31/2014, 11/20/2015  . Pneumococcal Conjugate-13 12/01/2012    HPI - 77-yr old female patient with medical history as above. She is being seen in follow-up for hypothyroidism with repeat thyroid function test.    -she is currently on Synthroid 100 mcg p.o. every morning.  She continues to tolerate this medication at this dose.  She has no new complaints today.    She continues to feel better, she has lost 10 pounds  recently.  - She denies heat/cold intolerance. She denies palpitations nor tremors.   She denies any history of goiter. She has family history of thyroid dysfunction in one of her daughters. She denies any family history of thyroid cancer. -She reports compliance to her thyroid hormone.  Review of Systems  Constitutional:  + lost  weight , no fatigue, no subjective hyperthermia, no subjective hypothermia Eyes: no blurry vision, no xerophthalmia ENT: no sore throat, no nodules palpated in throat, no dysphagia/odynophagia, no hoarseness Cardiovascular: no chest pain, no palpitations.    Respiratory: no cough, no SOB Gastrointestinal: no Nausea/Vomiting/Diarhhea Musculoskeletal: no muscle/joint aches Skin: no rashes Neurological: no tremors, no numbness, no dizziness.   Psychiatric: no depression, no anxiety  Objective:    BP (!) 152/89   Pulse 72   Ht 5\' 6"  (1.676 m)   Wt 225 lb (102.1 kg)   BMI 36.32 kg/m   Wt Readings from Last 3 Encounters:  10/22/17 225 lb (102.1 kg)  08/31/17 228 lb (103.4 kg)  07/13/17 231 lb 9.6 oz (105.1 kg)    Physical Exam  Constitutional:  + Obese for height, not in acute distress, normal state of mind.    Eyes: PERRLA, EOMI, no exophthalmos ENT: +  moist mucous membranes,   no thyromegaly , no cervical lymphadenopathy Musculoskeletal: no gross deformities,  Skin:  Moist, warm, no rashes  Neurological: no tremors of outstretched arms.    Recent Results (from the past 2160 hour(s))  COMPLETE METABOLIC PANEL WITH GFR     Status: Abnormal   Collection Time: 08/31/17 10:04 AM  Result Value Ref Range   Glucose, Bld 101 (H) 65 - 99 mg/dL    Comment: .            Fasting reference interval . For someone without known diabetes, a glucose  value between 100 and 125 mg/dL is consistent with prediabetes and should be confirmed with a follow-up test. .    BUN 22 7 - 25 mg/dL   Creat 1.25 (H) 0.60 - 0.93 mg/dL    Comment: For patients >78 years of age, the reference limit for Creatinine is approximately 13% higher for people identified as African-American. .    GFR, Est Non African American 41 (L) > OR = 60 mL/min/1.4m2   GFR, Est African American 47 (L) > OR = 60 mL/min/1.12m2  BUN/Creatinine Ratio 18 6 - 22 (calc)   Sodium 139 135 - 146 mmol/L   Potassium 3.8 3.5 - 5.3 mmol/L   Chloride 103 98 - 110 mmol/L   CO2 24 20 - 32 mmol/L   Calcium 10.0 8.6 - 10.4 mg/dL   Total Protein 7.8 6.1 - 8.1 g/dL   Albumin 4.4 3.6 - 5.1 g/dL   Globulin 3.4 1.9 - 3.7 g/dL (calc)   AG Ratio 1.3 1.0 - 2.5 (calc)   Total Bilirubin 0.7 0.2 - 1.2 mg/dL   Alkaline phosphatase (APISO) 85 33 - 130 U/L   AST 25 10 - 35 U/L   ALT 15 6 - 29 U/L  Lipid panel     Status: Abnormal   Collection Time: 08/31/17 10:04 AM  Result Value Ref Range   Cholesterol 126 <200 mg/dL   HDL 40 (L) >50 mg/dL   Triglycerides 102 <150 mg/dL   LDL Cholesterol (Calc) 67 mg/dL (calc)    Comment: Reference range: <100 . Desirable range <100 mg/dL for primary prevention;   <70 mg/dL for patients with CHD or diabetic patients  with > or = 2 CHD risk factors. Marland Kitchen LDL-C is now calculated using the Martin-Hopkins  calculation, which is a validated novel method providing  better accuracy than the Friedewald equation in the  estimation of LDL-C.  Cresenciano Genre et al. Annamaria Helling. 9563;875(64): 2061-2068  (http://education.[facility].com/faq/FAQ164)    Total CHOL/HDL Ratio 3.2 <5.0 (calc)   Non-HDL Cholesterol (Calc) 86 <130 mg/dL (calc)    Comment: For patients with diabetes plus 1 major ASCVD risk  factor, treating to a non-HDL-C goal of <100 mg/dL  (LDL-C of <70 mg/dL) is considered a therapeutic  option.   Hemoglobin A1c     Status: Abnormal   Collection Time:  08/31/17 10:04 AM  Result Value Ref Range   Hgb A1c MFr Bld 6.1 (H) <5.7 % of total Hgb    Comment: For someone without known diabetes, a hemoglobin  A1c value between 5.7% and 6.4% is consistent with prediabetes and should be confirmed with a  follow-up test. . For someone with known diabetes, a value <7% indicates that their diabetes is well controlled. A1c targets should be individualized based on duration of diabetes, age, comorbid conditions, and other considerations. . This assay result is consistent with an increased risk of diabetes. . Currently, no consensus exists regarding use of hemoglobin A1c for diagnosis of diabetes for children. .    Mean Plasma Glucose 128 (calc)   eAG (mmol/L) 7.1 (calc)  Vitamin D, 25-hydroxy     Status: Abnormal   Collection Time: 08/31/17 10:45 AM  Result Value Ref Range   Vit D, 25-Hydroxy 19 (L) 30 - 100 ng/mL    Comment: Vitamin D Status         25-OH Vitamin D: . Deficiency:                    <20 ng/mL Insufficiency:             20 - 29 ng/mL Optimal:                 > or = 30 ng/mL . For 25-OH Vitamin D testing on patients on  D2-supplementation and patients for whom quantitation  of D2 and D3 fractions is required, the [employer](TM) 25-OH VIT D, (D2,D3), LC/MS/MS is recommended: order  code 737 [id number] 0648 (patients >9yrs). . For more information on this test, go to: http://education.[facility].com/faq/FAQ163 (This link is being  provided for  informational/educational purposes only.)   T4, Free     Status: None   Collection Time: 08/31/17 10:45 AM  Result Value Ref Range   Free T4 1.5 0.8 - 1.8 ng/dL  TSH     Status: Abnormal   Collection Time: 08/31/17 10:45 AM  Result Value Ref Range   TSH 0.32 (L) 0.40 - 4.50 mIU/L      Diabetic Labs (most recent): Lab Results  Component Value Date   HGBA1C 6.1 (H) 08/31/2017   HGBA1C 5.6 04/08/2016   HGBA1C 6.2 (H) 07/17/2015       Assessment & Plan:   1.  Hypothyroidism 2. History of prediabetes -Her previsit labs are consistent with appropriate thyroid hormone replacement. -She is advised to continue Synthroid 100 mcg p.o. every morning.      - We discussed about correct intake of levothyroxine, at fasting, with water, separated by at least 30 minutes from breakfast, and separated by more than 4 hours from calcium, iron, multivitamins, acid reflux medications (PPIs). -Patient is made aware of the fact that thyroid hormone replacement is needed for life, dose to be adjusted by periodic monitoring of thyroid function tests.     Regarding her history of prediabetes: Her repeat labs show A1c of 6.1% increasing from 5.6%.  She is not on any medication intervention, will not need any for now.  - I advised patient to maintain close follow up with Alycia Rossetti, MD for primary care needs.  Follow up plan: Return in about 6 months (around 04/23/2018) for Follow up with Pre-visit Labs.  Glade Lloyd, MD Phone: [phone]  Fax: [phone]   10/22/2017, 11:33 AM

## 2017-10-29 ENCOUNTER — Ambulatory Visit: Payer: Medicare PPO | Admitting: "Endocrinology

## 2017-11-03 ENCOUNTER — Encounter (HOSPITAL_COMMUNITY): Payer: Self-pay

## 2017-11-03 ENCOUNTER — Emergency Department (HOSPITAL_COMMUNITY)
Admission: EM | Admit: 2017-11-03 | Discharge: 2017-11-03 | Disposition: A | Discharge status: Home / Self Care | Payer: Medicare PPO | Attending: Emergency Medicine | Admitting: Emergency Medicine

## 2017-11-03 ENCOUNTER — Other Ambulatory Visit: Payer: Self-pay

## 2017-11-03 DIAGNOSIS — Z79899 Other long term (current) drug therapy: Secondary | ICD-10-CM | POA: Diagnosis not present

## 2017-11-03 DIAGNOSIS — Z7902 Long term (current) use of antithrombotics/antiplatelets: Secondary | ICD-10-CM | POA: Diagnosis not present

## 2017-11-03 DIAGNOSIS — I10 Essential (primary) hypertension: Secondary | ICD-10-CM | POA: Insufficient documentation

## 2017-11-03 DIAGNOSIS — Y999 Unspecified external cause status: Secondary | ICD-10-CM | POA: Insufficient documentation

## 2017-11-03 DIAGNOSIS — Y33XXXA Other specified events, undetermined intent, initial encounter: Secondary | ICD-10-CM | POA: Insufficient documentation

## 2017-11-03 DIAGNOSIS — E119 Type 2 diabetes mellitus without complications: Secondary | ICD-10-CM | POA: Insufficient documentation

## 2017-11-03 DIAGNOSIS — M542 Cervicalgia: Secondary | ICD-10-CM | POA: Diagnosis not present

## 2017-11-03 DIAGNOSIS — Y929 Unspecified place or not applicable: Secondary | ICD-10-CM | POA: Insufficient documentation

## 2017-11-03 DIAGNOSIS — E039 Hypothyroidism, unspecified: Secondary | ICD-10-CM | POA: Diagnosis not present

## 2017-11-03 DIAGNOSIS — S161XXA Strain of muscle, fascia and tendon at neck level, initial encounter: Secondary | ICD-10-CM | POA: Diagnosis not present

## 2017-11-03 DIAGNOSIS — Z7982 Long term (current) use of aspirin: Secondary | ICD-10-CM | POA: Insufficient documentation

## 2017-11-03 DIAGNOSIS — Z853 Personal history of malignant neoplasm of breast: Secondary | ICD-10-CM | POA: Insufficient documentation

## 2017-11-03 DIAGNOSIS — Y939 Activity, unspecified: Secondary | ICD-10-CM | POA: Diagnosis not present

## 2017-11-03 MED ORDER — DICLOFENAC EPOLAMINE 1.3 % TD PTCH
1.0000 | MEDICATED_PATCH | Freq: Once | TRANSDERMAL | Status: DC
  Administered 2017-11-03: 1 via TRANSDERMAL
  Filled 2017-11-03: qty 1

## 2017-11-03 MED ORDER — DICLOFENAC EPOLAMINE 1.3 % TD PTCH: 1.0000 | MEDICATED_PATCH | Freq: Two times a day (BID) | TRANSDERMAL | 1 refills | Status: DC

## 2017-11-03 MED ORDER — DEXAMETHASONE SODIUM PHOSPHATE 10 MG/ML IJ SOLN
5.0000 mg | Freq: Once | INTRAMUSCULAR | Status: AC
  Administered 2017-11-03: 5 mg via INTRAMUSCULAR
  Filled 2017-11-03: qty 1

## 2017-11-03 NOTE — ED Triage Notes (Signed)
Pt is having neck pain that started Sunday that radiates down into her bilateral shoulders. NAD. Has taken Hydrocodone-Acetaminophen at 6:30 am. Has been having this issue since last year.

## 2017-11-03 NOTE — Discharge Instructions (Signed)
Please obtain the prescribed medication, use it as directed, and monitor your condition carefully.  It is important he follow-up with your physician to ensure that your condition is improving, or that you return here if you develop new, or concerning changes in your condition.

## 2017-11-03 NOTE — ED Provider Notes (Signed)
[name] EMERGENCY DEPARTMENT Provider Note   CSN: [id number] Arrival date & time: 11/03/17  1301     History   Chief Complaint Chief Complaint  Patient presents with  . Neck Pain    HPI Robin [name] is a 77 y.o. female.  HPI  Patient presents with concern of neck pain.  The patient is here with her daughter who assists with the HPI. Patient has a long history of arthritis, as well as history for at least 1 week, likely longer of pain in her right arm, neck posteriorly, diffusely, described as sore, worse with activity. No new vision changes, confusion, nausea, vomiting, syncope, chest pain. She takes all medication as directed including Vicodin, without substantial change in her pain. No weakness in her extremities, though she does have some numbness in her distal thumb.    Past Medical History:  Diagnosis Date  . Breast cancer ([place])    Tamoxifen started 2007  . CAD (coronary artery disease)    DES second diagonal 2/12  . Cardiomyopathy    Probable Takotsubo, LVEF 30-35% 2/12  . Carotid artery disease ([place])   . Essential hypertension   . Hepatic steatosis   . Hyperlipidemia   . Hypothyroidism   . NSTEMI (non-ST elevated myocardial infarction) ([place])    2/12  . Pancreas divisum   . Stroke ([place])    (2000) residual left-sided weakness  . Type 2 diabetes mellitus [facility])     Patient Active Problem List   Diagnosis Date Noted  . Vitamin D deficiency 10/22/2017  . Hyperglycemia 08/31/2017  . Constipation 11/17/2016  . Acute onset of severe vertigo 04/26/2015  . Prediabetes 07/19/2012  . Tinnitus 12/04/2011  . Leg pain 09/19/2011  . Hypothyroidism 09/19/2011  . OA (osteoarthritis) of hip 09/19/2011  . Obesity 09/19/2011  . Nail problem 05/13/2011  . Dizzy spells 05/13/2011  . Paresthesia 05/13/2011  . Carotid artery disease ([place]) 04/24/2011  . Edema 09/17/2010  . Secondary cardiomyopathy ([place]) 04/09/2010  . Coronary atherosclerosis of native coronary  artery 04/09/2010  . Essential hypertension, benign 04/09/2010  . Mixed hyperlipidemia 04/09/2010    Past Surgical History:  Procedure Laterality Date  . ABDOMINAL HYSTERECTOMY    . CAROTID ENDARTERECTOMY     Left  . CHOLECYSTECTOMY    . JOINT REPLACEMENT     [facility]  . MASTECTOMY     Bilateral     OB History    Gravida  6   Para  6   Term  6   Preterm      AB      Living  6     SAB      TAB      Ectopic      Multiple      Live Births               Home Medications    Prior to Admission medications   Medication Sig Start Date End Date Taking? Authorizing Provider  amLODipine (NORVASC) 10 MG tablet TAKE 1 TABLET BY MOUTH ONCE DAILY 05/12/17  Yes [place], Modena Nunnery, MD  aspirin 81 MG tablet Take 81 mg by mouth daily.     Yes [provider]  carvedilol (COREG) 12.5 MG tablet TAKE 1.5 TABLETS BY MOUTH TWICE DAILY 11/17/16  Yes [place], Modena Nunnery, MD  Cholecalciferol (VITAMIN D3) 5000 units CAPS Take 1 capsule (5,000 Units total) by mouth daily. 10/22/17  Yes Nida, Marella Chimes, MD  clopidogrel (PLAVIX) 75 MG tablet TAKE 1 TABLET BY MOUTH ONCE DAILY 10/15/17  Yes [place], Modena Nunnery, MD  HYDROcodone-acetaminophen (NORCO) 5-325 MG tablet Take 1.5 tablet to 1 full tablet twice daily as needed. 09/08/17  Yes [place], Modena Nunnery, MD  ibuprofen (ADVIL,MOTRIN) 600 MG tablet Take 600 mg by mouth every 6 (six) hours as needed for fever or headache.  02/04/05  Yes [provider]  levothyroxine (SYNTHROID) 100 MCG tablet TAKE 1 TABLET BY MOUTH EVERY DAY BEFORE BREAKFAST 10/22/17  Yes Nida, Marella Chimes, MD  losartan-hydrochlorothiazide (HYZAAR) 50-12.5 MG tablet TAKE 1 TABLET BY MOUTH ONCE DAILY 07/24/17  Yes , Modena Nunnery, MD  Omega-3 Fatty Acids 300 MG CAPS Take 1 capsule (300 mg total) by mouth 2 (two) times daily. 04/24/11  Yes [place], Burna Forts, PA-C  rosuvastatin (CRESTOR) 40 MG tablet TAKE 1 TABLET BY MOUTH ONCE  DAILY 10/07/17  Yes , Modena Nunnery, MD  docusate sodium (COLACE) 100 MG capsule Take 100 mg by mouth as needed for mild constipation.    [provider]  nitroGLYCERIN (NITROSTAT) 0.4 MG SL tablet Place 1 tablet (0.4 mg total) under the tongue every 5 (five) minutes x 3 doses as needed. 01/11/14   Satira Sark, MD    Family History Family History  Problem Relation Age of Onset  . Heart attack Sister   . Heart disease Sister   . Heart attack Brother   . Heart disease Brother     Social History Social History   Tobacco Use  . Smoking status: Never Smoker  . Smokeless tobacco: Never Used  Substance Use Topics  . Alcohol use: No    Alcohol/week: 0.0 standard drinks  . Drug use: No     Allergies   Codeine   Review of Systems Review of Systems  Constitutional:       Per HPI, otherwise negative  HENT:       Per HPI, otherwise negative  Respiratory:       Per HPI, otherwise negative  Cardiovascular:       Per HPI, otherwise negative  Gastrointestinal: Negative for vomiting.  Endocrine:       Negative aside from HPI  Genitourinary:       Neg aside from HPI   Musculoskeletal:       Per HPI, otherwise negative  Skin: Negative.   Neurological: Positive for numbness. Negative for syncope.     Physical Exam Updated Vital Signs BP (!) 216/84 (BP Location: Right Wrist)   Pulse 65   Temp 98.3 F (36.8 C) (Oral)   Resp 15   Ht 5\' 6"  (1.676 m)   Wt 102 kg   SpO2 95%   BMI 36.29 kg/m   Physical Exam  Constitutional: She is oriented to person, place, and time. She appears well-developed and well-nourished. No distress.  HENT:  Head: Normocephalic and atraumatic.  Eyes: Conjunctivae and EOM are normal.  Neck: Muscular tenderness present. No spinous process tenderness present. No neck rigidity. No edema, no erythema and normal range of motion present.  Cardiovascular: Normal rate and regular rhythm.  Pulmonary/Chest: Effort normal and breath sounds  normal. No stridor. No respiratory distress.  Abdominal: She exhibits no distension.  Musculoskeletal: She exhibits no edema.  Neurological: She is alert and oriented to person, place, and time. She displays atrophy. She displays no tremor. No cranial nerve deficit. She exhibits normal muscle tone. She displays no seizure activity. Coordination normal.  No objective neurologic deficiencies  Skin:  Skin is warm and dry.  Psychiatric: She has a normal mood and affect.  Nursing note and vitals reviewed.    ED Treatments / Results   Procedures Procedures (including critical care time)  Medications Ordered in ED Medications  dexamethasone (DECADRON) injection 5 mg (has no administration in time range)  diclofenac (FLECTOR) 1.3 % 1 patch (has no administration in time range)     Initial Impression / Assessment and Plan / ED Course  I have reviewed the triage vital signs and the nursing notes.  Pertinent labs & imaging results that were available during my care of the patient were reviewed by me and considered in my medical decision making (see chart for details).  The initial evaluation reviewed my assessment, suspicious for inflammatory, possibly arthritic condition, low suspicion for atypical ACS, and absent objective neurologic deficiencies, low suspicion for CNS pathology or vascular compromise.  Patient is already on Plavix, aspirin, appropriate maximal management medically for stroke prevention, vascular disease. Patient is hypertensive, but does have some ongoing discomfort, has no evidence for distress.  She has a long history of hypertension, and absent distress, low suspicion for endorgan effects. Patient encouraged to take her home medication, and started on a medication of topical anti-inflammatories for her neck pain. Patient requested additional anti-inflammatory via injection, and received Decadron, IM.   Final Clinical Impressions(s) / ED Diagnoses  Acute neck pain     Carmin Muskrat, MD 11/03/17 1443

## 2017-12-05 ENCOUNTER — Other Ambulatory Visit: Payer: Self-pay | Admitting: Family Medicine

## 2017-12-28 ENCOUNTER — Ambulatory Visit (INDEPENDENT_AMBULATORY_CARE_PROVIDER_SITE_OTHER): Payer: Medicare PPO | Admitting: Family Medicine

## 2017-12-28 ENCOUNTER — Encounter: Payer: Self-pay | Admitting: Family Medicine

## 2017-12-28 ENCOUNTER — Other Ambulatory Visit: Payer: Self-pay

## 2017-12-28 VITALS — BP 142/68 | HR 68 | Temp 98.5°F | Resp 16 | Ht 66.0 in | Wt 227.0 lb

## 2017-12-28 DIAGNOSIS — I2510 Atherosclerotic heart disease of native coronary artery without angina pectoris: Secondary | ICD-10-CM | POA: Diagnosis not present

## 2017-12-28 DIAGNOSIS — R7303 Prediabetes: Secondary | ICD-10-CM

## 2017-12-28 DIAGNOSIS — I10 Essential (primary) hypertension: Secondary | ICD-10-CM

## 2017-12-28 DIAGNOSIS — Z23 Encounter for immunization: Secondary | ICD-10-CM | POA: Diagnosis not present

## 2017-12-28 DIAGNOSIS — E039 Hypothyroidism, unspecified: Secondary | ICD-10-CM | POA: Diagnosis not present

## 2017-12-28 DIAGNOSIS — R2241 Localized swelling, mass and lump, right lower limb: Secondary | ICD-10-CM | POA: Diagnosis not present

## 2017-12-28 NOTE — Progress Notes (Signed)
   Subjective:    Patient ID: Robin [name], female    DOB: [birth year], 77 y.o.   MRN: [id number]  Patient presents for Follow-up (is fasting)    Pt here to f/u chronic medical.   Her arthritis has been causing more pain in her neck, she had to go to ER , given decadron and flector patch. She has some norco at home   She has been taking regular tylenol  Used all kinds of topical  It is now improved   Followed by endocrinology for her thyroid  Has a hard knot on her right leg has been present past month, no change in size, still a little sore No specific injury, no drainage   Reviewed recent fasting labs      Review Of Systems:  GEN- denies fatigue, fever, weight loss,weakness, recent illness HEENT- denies eye drainage, change in vision, nasal discharge, CVS- denies chest pain, palpitations RESP- denies SOB, cough, wheeze ABD- denies N/V, change in stools, abd pain GU- denies dysuria, hematuria, dribbling, incontinence MSK- +joint pain, muscle aches, injury Neuro- denies headache, dizziness, syncope, seizure activity       Objective:    BP (!) 142/68   Pulse 68   Temp 98.5 F (36.9 C) (Oral)   Resp 16   Ht 5\' 6"  (1.676 m)   Wt 227 lb (103 kg)   SpO2 98%   BMI 36.64 kg/m  GEN- NAD, alert and oriented x3 HEENT- PERRL, EOMI, non injected sclera, pink conjunctiva, MMM, oropharynx clear, nares clear rhinorrhea, TM clear no effusion  Neck- Supple, no thyromegaly, no LAD  CVS- RRR, 2/6 SEM RESP-CTAB ABD-NABS,soft,NT,ND EXT- chronic pedal edema R>L , right lower leg hardened nodule quarter size, NT, no erythema  Pulses- Radial 2+ DP1+        Assessment & Plan:      Problem List Items Addressed This Visit      Unprioritized   Coronary atherosclerosis of native coronary artery    Followed by cardioliogy, Blood pressure is stable, no change to meds Lipids at goal      Essential hypertension, benign    Controlled for her age and comorbidites      Hypothyroidism    Per endocrinology      Prediabetes    Continue to monitor sweets and carbs, no meds needed       Other Visit Diagnoses    Mass of right lower leg    -  Primary   Need for prophylactic vaccination and inoculation against influenza       Relevant Orders   Flu vaccine HIGH DOSE PF (Completed)      Note: This dictation was prepared with Dragon dictation along with smaller phrase technology. Any transcriptional errors that result from this process are unintentional.

## 2017-12-28 NOTE — Patient Instructions (Addendum)
Ultrasound sound to be done Continue your current medications  Flu shot given  F/U 4 months

## 2017-12-29 ENCOUNTER — Encounter: Payer: Self-pay | Admitting: Family Medicine

## 2017-12-29 NOTE — Assessment & Plan Note (Signed)
Continue to monitor sweets and carbs, no meds needed

## 2017-12-29 NOTE — Assessment & Plan Note (Addendum)
Controlled for her age and comorbidites

## 2017-12-29 NOTE — Assessment & Plan Note (Signed)
Followed by cardioliogy, Blood pressure is stable, no change to meds Lipids at goal

## 2017-12-29 NOTE — Assessment & Plan Note (Signed)
Per endocrinology 

## 2018-01-04 ENCOUNTER — Ambulatory Visit (INDEPENDENT_AMBULATORY_CARE_PROVIDER_SITE_OTHER): Payer: Medicare PPO | Admitting: Cardiology

## 2018-01-04 ENCOUNTER — Encounter: Payer: Self-pay | Admitting: Cardiology

## 2018-01-04 VITALS — BP 150/85 | HR 64 | Ht 66.0 in | Wt 229.0 lb

## 2018-01-04 DIAGNOSIS — Z8679 Personal history of other diseases of the circulatory system: Secondary | ICD-10-CM

## 2018-01-04 DIAGNOSIS — I10 Essential (primary) hypertension: Secondary | ICD-10-CM | POA: Diagnosis not present

## 2018-01-04 DIAGNOSIS — E782 Mixed hyperlipidemia: Secondary | ICD-10-CM | POA: Diagnosis not present

## 2018-01-04 DIAGNOSIS — I25119 Atherosclerotic heart disease of native coronary artery with unspecified angina pectoris: Secondary | ICD-10-CM | POA: Diagnosis not present

## 2018-01-04 NOTE — Patient Instructions (Addendum)

## 2018-01-04 NOTE — Progress Notes (Signed)
Cardiology Office Note  Date: 01/04/2018   ID: Robin [name], DOB May 01, 1940, MRN [id number]  PCP: Alycia Rossetti, MD  Primary Cardiologist: Rozann Lesches, MD   Chief Complaint  Patient presents with  . Coronary Artery Disease    History of Present Illness: Robin [name] is a 77 y.o. female last seen in June.  She is here today with her daughter for a follow-up visit.  She does not report any major change in stamina with basic ADLs.  NYHA class II dyspnea and no angina symptoms.  Echocardiogram from 2017 is outlined below.  Her cardiac regimen includes aspirin, Coreg, Plavix, Norvasc, Crestor, and Hyzaar.  She follows with Dr. Buelah Manis regularly.  I reviewed interval lab work.  LDL 67.  Past Medical History:  Diagnosis Date  . Breast cancer ([place])    Tamoxifen started 2007  . CAD (coronary artery disease)    DES second diagonal 2/12  . Cardiomyopathy    Probable Takotsubo, LVEF 30-35% 2/12  . Carotid artery disease ([place])   . Essential hypertension   . Hepatic steatosis   . Hyperlipidemia   . Hypothyroidism   . NSTEMI (non-ST elevated myocardial infarction) ([place])    2/12  . Pancreas divisum   . Stroke ([place])    (2000) residual left-sided weakness  . Type 2 diabetes mellitus ([place])     Past Surgical History:  Procedure Laterality Date  . ABDOMINAL HYSTERECTOMY    . CAROTID ENDARTERECTOMY     Left  . CHOLECYSTECTOMY    . JOINT REPLACEMENT     [facility]  . MASTECTOMY     Bilateral    Current Outpatient Medications  Medication Sig Dispense Refill  . amLODipine (NORVASC) 10 MG tablet TAKE 1 TABLET BY MOUTH ONCE DAILY 90 tablet 2  . aspirin 81 MG tablet Take 81 mg by mouth daily.      . carvedilol (COREG) 12.5 MG tablet TAKE 1.5 TABLETS BY MOUTH TWICE DAILY 90 tablet 5  . Cholecalciferol (VITAMIN D3) 5000 units CAPS Take 1 capsule (5,000 Units total) by mouth daily. 90 capsule 0  . clopidogrel (PLAVIX) 75 MG tablet  TAKE 1 TABLET BY MOUTH ONCE DAILY 90 tablet 1  . diclofenac (FLECTOR) 1.3 % PTCH Place 1 patch onto the skin 2 (two) times daily. 5 patch 1  . docusate sodium (COLACE) 100 MG capsule Take 100 mg by mouth as needed for mild constipation.    Marland Kitchen HYDROcodone-acetaminophen (NORCO) 5-325 MG tablet Take 1.5 tablet to 1 full tablet twice daily as needed. 45 tablet 0  . ibuprofen (ADVIL,MOTRIN) 600 MG tablet Take 600 mg by mouth every 6 (six) hours as needed for fever or headache.     . levothyroxine (SYNTHROID) 100 MCG tablet TAKE 1 TABLET BY MOUTH EVERY DAY BEFORE BREAKFAST 90 tablet 2  . losartan-hydrochlorothiazide (HYZAAR) 50-12.5 MG tablet TAKE 1 TABLET BY MOUTH ONCE DAILY 90 tablet 2  . nitroGLYCERIN (NITROSTAT) 0.4 MG SL tablet Place 1 tablet (0.4 mg total) under the tongue every 5 (five) minutes x 3 doses as needed. 25 tablet 3  . Omega-3 Fatty Acids 300 MG CAPS Take 1 capsule (300 mg total) by mouth 2 (two) times daily.    . rosuvastatin (CRESTOR) 40 MG tablet TAKE 1 TABLET BY MOUTH ONCE DAILY 90 tablet 2   No current facility-administered medications for this visit.    Allergies:  Codeine   Social History: The patient  reports that  she has never smoked. She has never used smokeless tobacco. She reports that she does not drink alcohol or use drugs.   ROS:  Please see the history of present illness. Otherwise, complete review of systems is positive for arthritic pains.  All other systems are reviewed and negative.   Physical Exam: VS:  BP (!) 150/85   Pulse 64   Ht 5' 6"  (1.676 m)   Wt 229 lb (103.9 kg)   SpO2 98%   BMI 36.96 kg/m , BMI Body mass index is 36.96 kg/m.  Wt Readings from Last 3 Encounters:  01/04/18 229 lb (103.9 kg)  12/28/17 227 lb (103 kg)  11/03/17 224 lb 13.9 oz (102 kg)    General: Patient appears comfortable at rest. HEENT: Conjunctiva and lids normal, oropharynx clear. Neck: Supple, no elevated JVP, left CEA scar, no thyromegaly. Lungs: Clear to auscultation,  nonlabored breathing at rest. Cardiac: Regular rate and rhythm, no S3, 2/6 systolic murmur. Abdomen: Soft, nontender, bowel sounds present. Extremities: Mild leg edema as before, distal pulses 2+. Skin: Warm and dry. Musculoskeletal: No kyphosis. Neuropsychiatric: Alert and oriented x3, affect grossly appropriate.  ECG: I personally reviewed the tracing from 07/13/2017 which showed sinus bradycardia with PAC.  Recent Labwork: 08/31/2017: ALT 15; AST 25; BUN 22; Creat 1.25; Potassium 3.8; Sodium 139; TSH 0.32     Component Value Date/Time   CHOL 126 08/31/2017 1004   TRIG 102 08/31/2017 1004   HDL 40 (L) 08/31/2017 1004   CHOLHDL 3.2 08/31/2017 1004   VLDL 20 04/08/2016 0910   LDLCALC 67 08/31/2017 1004    Other Studies Reviewed Today:  Echocardiogram 04/27/2015: Study Conclusions  - Left ventricle: The cavity size was normal. Wall thickness was   increased in a pattern of severe LVH. Systolic function was   normal. The estimated ejection fraction was in the range of 60%   to 65%. Wall motion was normal; there were no regional wall   motion abnormalities. Doppler parameters are consistent with   abnormal left ventricular relaxation (grade 1 diastolic   dysfunction). Doppler parameters are consistent with high   ventricular filling pressure. - Aortic valve: Mildly calcified annulus. Trileaflet; mildly   calcified leaflets. - Mitral valve: Calcified annulus. There was trivial regurgitation. - Left atrium: The atrium was mildly dilated. - Right atrium: Central venous pressure (est): 3 mm Hg. - Tricuspid valve: There was trivial regurgitation. - Pulmonary arteries: PA peak pressure: 26 mm Hg (S). - Pericardium, extracardiac: There was no pericardial effusion.  Impressions:  - Severe LVH with LVEF 60-65%. Grade 1 diastolic dysfunction with   increased LV filling pressures. Mild left atrial enlargement. MAC   with trivial mitral regurgitation. Mildly sclerotic aortic valve.    Trivial tricuspid regurgitation with PASP 26 mmHg mmHg.  Assessment and Plan:  1.  History of Tako-tsubo cardiomyopathy with normalization of LVEF and no change in clinical status on medical therapy.  Continue observation for now.  2.  CAD status post DES to the second diagonal branch in 2012.  She reports no angina and we continue medical therapy including antiplatelet regimen and statin.  3.  Essential hypertension, no changes made to present regimen.  Keep follow-up with Dr. Buelah Manis.  4.  Hyperlipidemia on Crestor, LDL 67.  Current medicines were reviewed with the patient today.  Disposition: Follow-up in 6 months.  Signed, Satira Sark, MD, [facility] 01/04/2018 9:41 AM    [facility] [place], [place],  [place] [address] Phone: [phone]; Fax: [phone]

## 2018-01-12 ENCOUNTER — Other Ambulatory Visit: Payer: Self-pay | Admitting: "Endocrinology

## 2018-01-19 ENCOUNTER — Other Ambulatory Visit: Payer: Self-pay | Admitting: Family Medicine

## 2018-01-19 MED ORDER — HYDROCODONE-ACETAMINOPHEN 5-325 MG PO TABS: ORAL_TABLET | ORAL | 0 refills | Status: DC

## 2018-01-19 NOTE — Telephone Encounter (Signed)
Ok to refill??  Last office visit 12/28/2017.  Last refill 09/08/2017.

## 2018-01-19 NOTE — Telephone Encounter (Signed)
Pt needs refill on hydrocodone [facility]

## 2018-01-20 DIAGNOSIS — E7800 Pure hypercholesterolemia, unspecified: Secondary | ICD-10-CM | POA: Diagnosis not present

## 2018-01-20 DIAGNOSIS — M4722 Other spondylosis with radiculopathy, cervical region: Secondary | ICD-10-CM | POA: Diagnosis not present

## 2018-01-20 DIAGNOSIS — E039 Hypothyroidism, unspecified: Secondary | ICD-10-CM | POA: Diagnosis not present

## 2018-01-20 DIAGNOSIS — Z8673 Personal history of transient ischemic attack (TIA), and cerebral infarction without residual deficits: Secondary | ICD-10-CM | POA: Diagnosis not present

## 2018-01-20 DIAGNOSIS — Z955 Presence of coronary angioplasty implant and graft: Secondary | ICD-10-CM | POA: Diagnosis not present

## 2018-01-20 DIAGNOSIS — Z7982 Long term (current) use of aspirin: Secondary | ICD-10-CM | POA: Diagnosis not present

## 2018-01-20 DIAGNOSIS — Z853 Personal history of malignant neoplasm of breast: Secondary | ICD-10-CM | POA: Diagnosis not present

## 2018-01-20 DIAGNOSIS — I10 Essential (primary) hypertension: Secondary | ICD-10-CM | POA: Diagnosis not present

## 2018-01-20 DIAGNOSIS — I252 Old myocardial infarction: Secondary | ICD-10-CM | POA: Diagnosis not present

## 2018-02-04 ENCOUNTER — Ambulatory Visit (INDEPENDENT_AMBULATORY_CARE_PROVIDER_SITE_OTHER): Payer: Medicare PPO

## 2018-02-04 DIAGNOSIS — R2241 Localized swelling, mass and lump, right lower limb: Secondary | ICD-10-CM | POA: Diagnosis not present

## 2018-02-25 ENCOUNTER — Encounter: Payer: Self-pay | Admitting: Family Medicine

## 2018-03-23 ENCOUNTER — Other Ambulatory Visit: Payer: Self-pay | Admitting: Family Medicine

## 2018-04-26 ENCOUNTER — Ambulatory Visit: Payer: Medicare PPO | Admitting: "Endocrinology

## 2018-04-30 ENCOUNTER — Ambulatory Visit: Payer: Medicare PPO | Admitting: Family Medicine

## 2018-05-03 ENCOUNTER — Encounter: Payer: Self-pay | Admitting: Family Medicine

## 2018-05-03 ENCOUNTER — Other Ambulatory Visit: Payer: Self-pay

## 2018-05-03 ENCOUNTER — Ambulatory Visit (INDEPENDENT_AMBULATORY_CARE_PROVIDER_SITE_OTHER): Payer: Medicare PPO | Admitting: Family Medicine

## 2018-05-03 DIAGNOSIS — M1611 Unilateral primary osteoarthritis, right hip: Secondary | ICD-10-CM

## 2018-05-03 DIAGNOSIS — I10 Essential (primary) hypertension: Secondary | ICD-10-CM | POA: Diagnosis not present

## 2018-05-03 DIAGNOSIS — R7303 Prediabetes: Secondary | ICD-10-CM

## 2018-05-03 DIAGNOSIS — I2510 Atherosclerotic heart disease of native coronary artery without angina pectoris: Secondary | ICD-10-CM

## 2018-05-03 MED ORDER — HYDROCODONE-ACETAMINOPHEN 5-325 MG PO TABS: ORAL_TABLET | ORAL | 0 refills | Status: DC

## 2018-05-03 NOTE — Progress Notes (Signed)
Virtual Visit via Telephone Note  I connected with Robin [name] on 05/03/18 at  2:20 PM EDT by telephone and verified that I am speaking with the correct person using two identifiers.   Pt location:  At home    [place] location: in office, [facility], Vic Blackbird MD  I discussed the limitations, risks, security and privacy concerns of performing an evaluation and management service by telephone and the availability of in person appointments. I also discussed with the patient that there may be a patient responsible charge related to this service. The patient expressed understanding and agreed to proceed.   History of Present Illness:  Telephone visit-he is doing well she has no concerns   HTN- no headaches, or chest pain , SOB   Taking bp meds as prescribed, her blood pressure machine broke states that they are trying to get her knee when from the local pharmacy  OA -no falls , norco refill needed , flector patch was too expensive   CAD/CVA- taking plavix per above no symptoms  No leg swelling   She has been avoiding public she does not have any respiratory symptoms has been staying within her home.  Her children are providing meals for her.    Observations/Objective: None   Assessment and Plan: HTN- pt to call if BP level 140/90, no changes  For arthritis we will refill her hydrocodone for severe pain she also takes Tylenol for milder pain.  Coronary artery disease history of stroke continue Plavix she is also on statin drug.  Prediabetes discussed watching the sweets while she is quarantine.  She admits that she has decreased her sweets and baking.  She will have labs done by June.  Follow Up Instructions:    I discussed the assessment and treatment plan with the patient. The patient was provided an opportunity to ask questions and all were answered. The patient agreed with the plan and demonstrated an understanding of the instructions.   The  patient was advised to call back or seek an in-person evaluation if the symptoms worsen or if the condition fails to improve as anticipated.  I provided 6 minutes of non-face-to-face time during this encounter.  End time 2:26  Vic Blackbird, MD

## 2018-05-20 ENCOUNTER — Telehealth: Payer: Self-pay | Admitting: *Deleted

## 2018-05-20 NOTE — Telephone Encounter (Signed)
ReceivedFMLA forms([place]) for patient daughter [name].   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 Patient also takes care of mother if she is unwell.   Verbalized that fee may be charged and is per provider prerogative.   Forms routed to provider.

## 2018-05-21 ENCOUNTER — Other Ambulatory Visit: Payer: Self-pay | Admitting: Family Medicine

## 2018-05-21 NOTE — Telephone Encounter (Signed)
noted 

## 2018-05-25 NOTE — Telephone Encounter (Signed)
Received completed FMLA from provider.   Simple Fee charge per provider. Patient daughter contacted to pay fee.

## 2018-06-17 ENCOUNTER — Other Ambulatory Visit: Payer: Self-pay | Admitting: Family Medicine

## 2018-06-25 IMAGING — CT CT RENAL STONE PROTOCOL
2 of 4 series · 15 of 46 positions shown, 17 images · non-contrast
Comparison: Report from 04/08/2011

CLINICAL DATA: Abdominal and back pain for 1 week. History of
breast cancer with bilateral mastectomy.

EXAM:
CT ABDOMEN AND PELVIS WITHOUT CONTRAST
TECHNIQUE: Multidetector CT imaging of the abdomen and pelvis was performed
following the standard protocol without IV contrast.

[Series 2: axial st · axial · 0.79mm/px · z∈[-404,-14]mm · 12 of 86 slices shown, 14 images]
[im 4/86  soft-tissue]
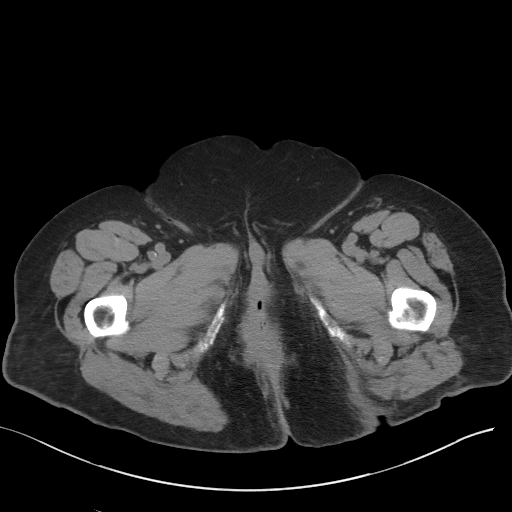
[im 4/86  bone]
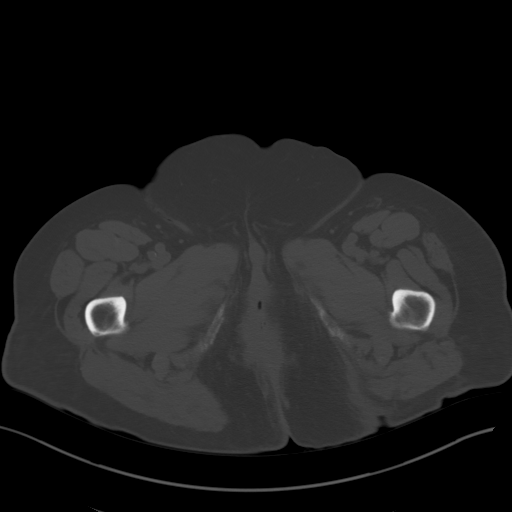
[im 11/86  soft-tissue]
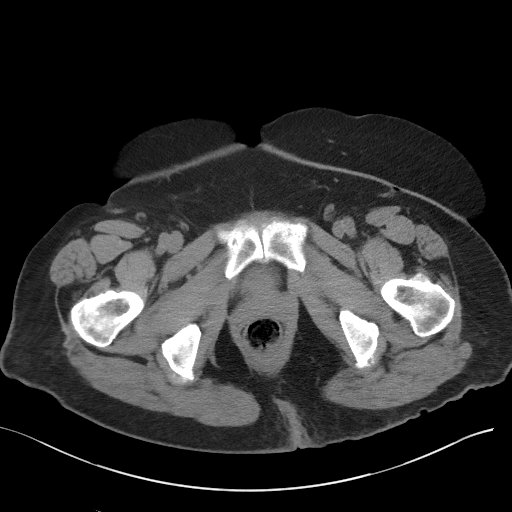
[im 18/86  soft-tissue]
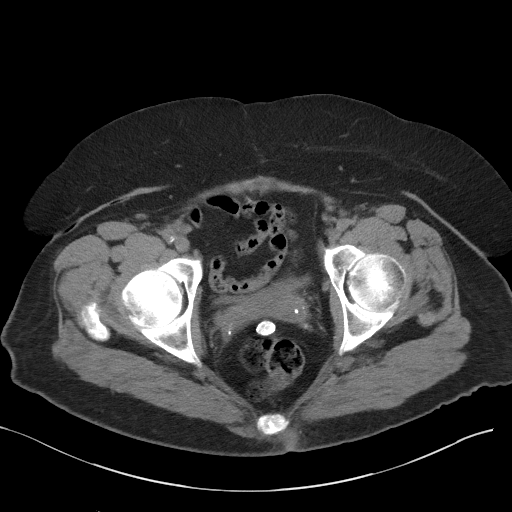
[im 25/86  soft-tissue]
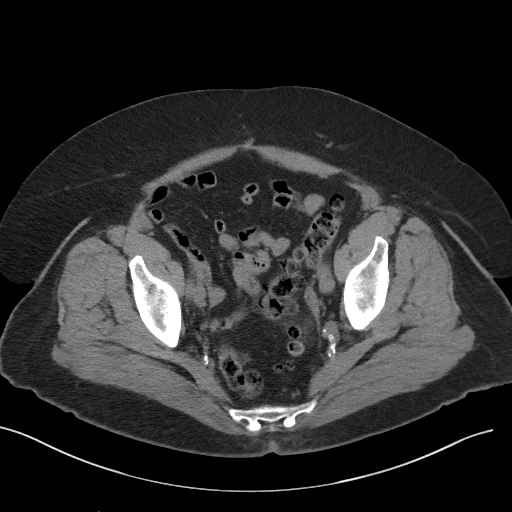
[im 32/86  soft-tissue]
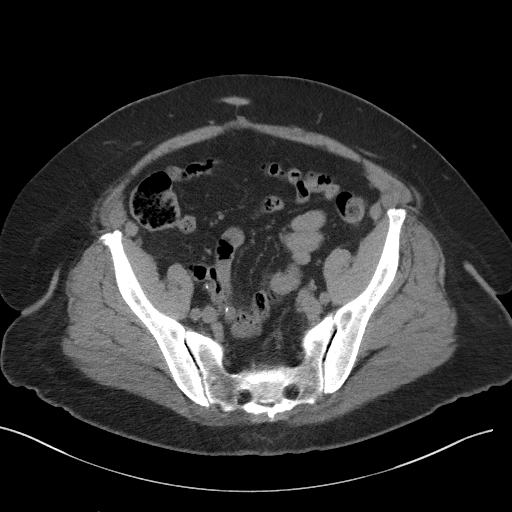
[im 39/86  soft-tissue]
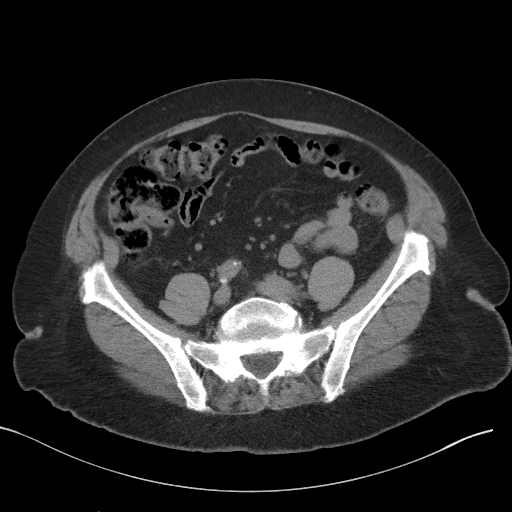
[im 47/86  soft-tissue]
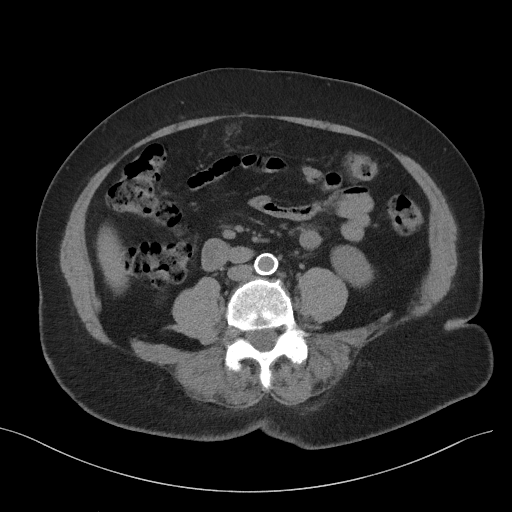
[im 54/86  soft-tissue]
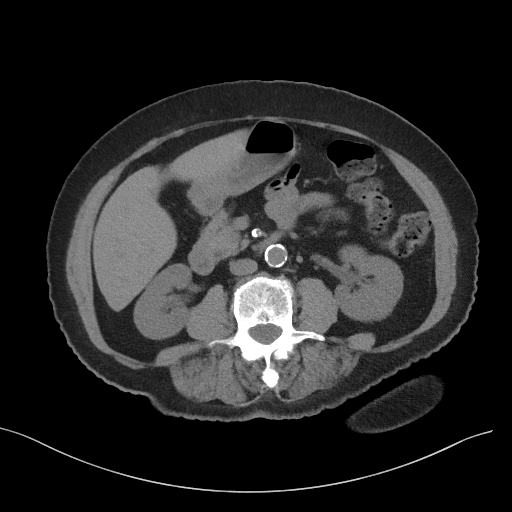
[im 61/86  soft-tissue]
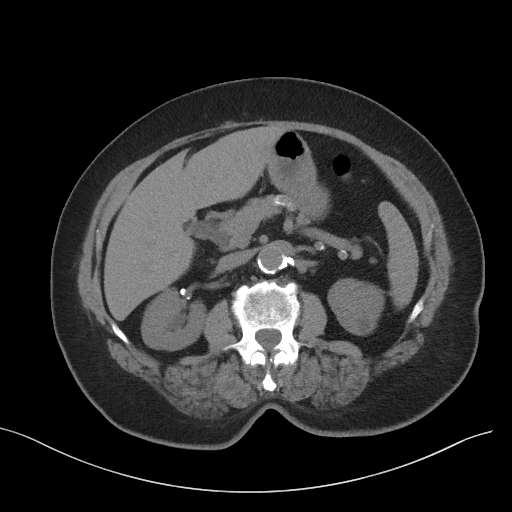
[im 61/86  bone]
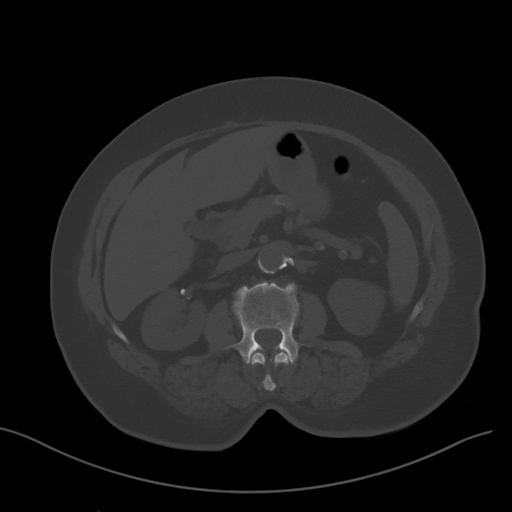
[im 68/86  soft-tissue]
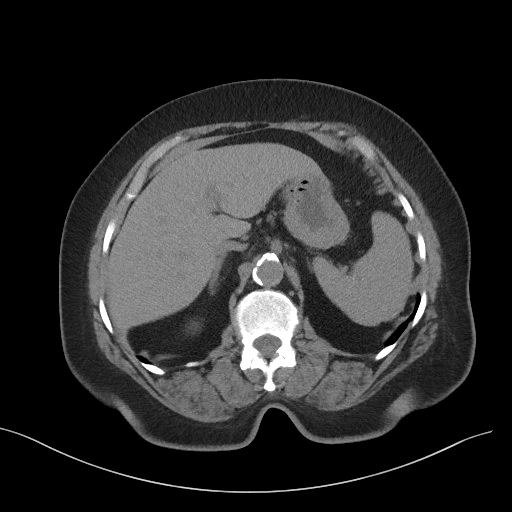
[im 75/86  soft-tissue]
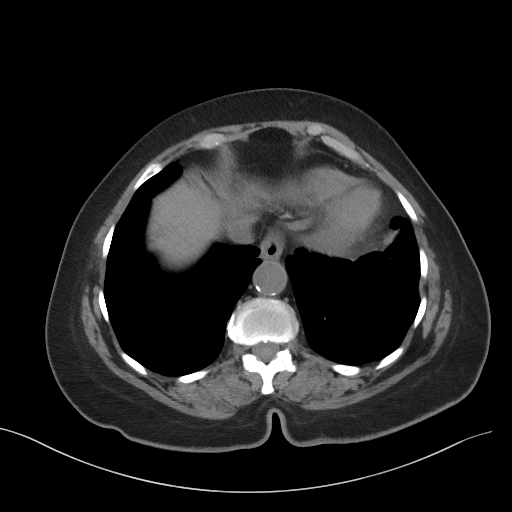
[im 82/86  soft-tissue]
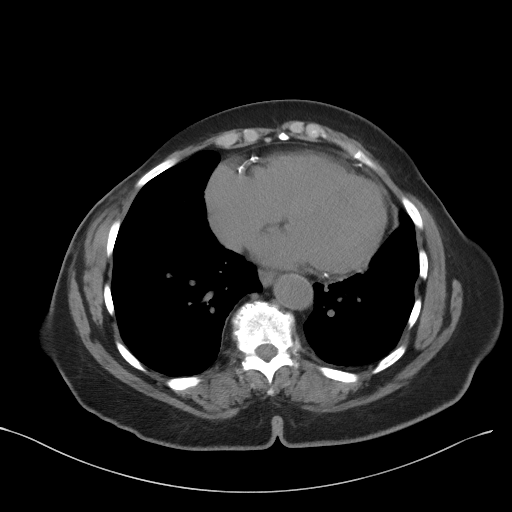

[Series 5: coronal st · coronal · 0.72mm/px · 3 of 101 slices shown]
[im 34/101  soft-tissue]
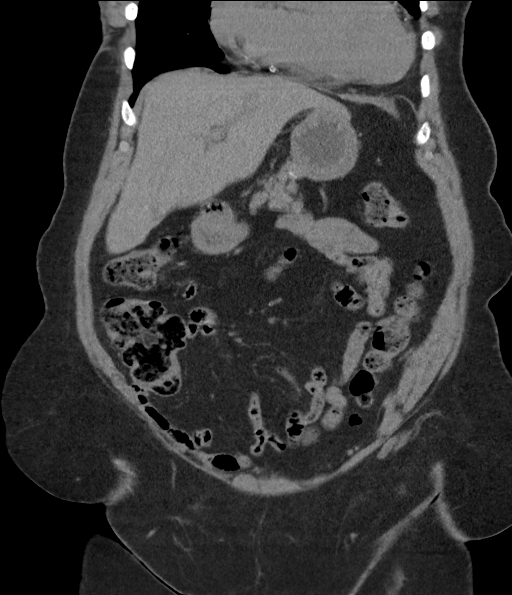
[im 45/101  soft-tissue]
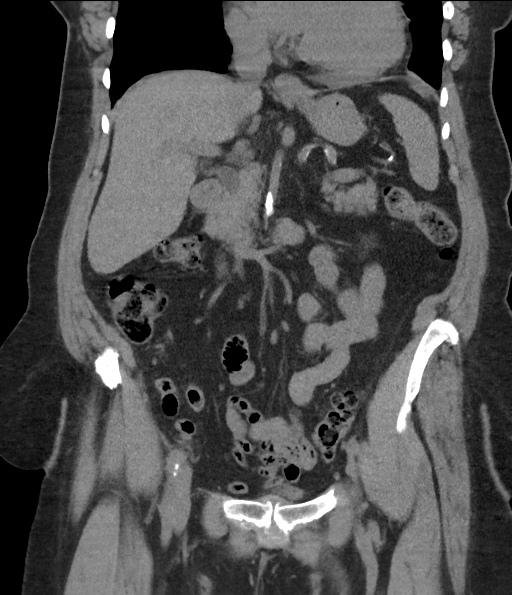
[im 56/101  soft-tissue]
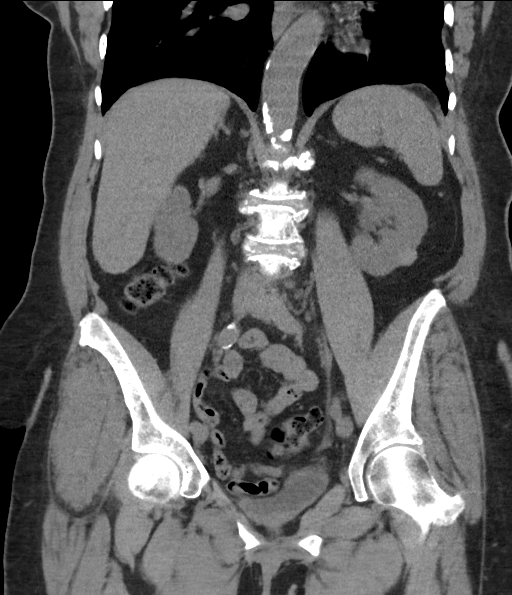

[15 of 46 positions shown; findings below may reference images not displayed]

FINDINGS: Lower chest: Postoperative findings from prior mastectomy. Linear
scarring in the lower lobes, left greater than right. Mild
cardiomegaly with coronary artery atherosclerotic vascular disease.
Small type 1 hiatal hernia.

Hepatobiliary: Gallbladder not seen presumed surgically absent. CBD
1.1 cm in diameter, possibly a physiologic response to
cholecystectomy.

Pancreas: The known pancreas divisum is only faintly apparent on
today' s CT; no peripancreatic stranding to suggest acute
pancreatitis.

Spleen: Unremarkable

Adrenals/Urinary Tract: Adrenal glands normal.

Vascular calcifications in the right renal hilum. No appreciable
renal calculi.

Hypodense 1.2 cm left kidney upper pole lesion, fluid density, image
[DATE], new compared to 04/08/2011.

Hyperdense peripheral exophytic 1.3 by 1.1 cm lesion in the left
kidney lower pole, internal density 48 Hounsfield units. This lesion
is new compared to the prior CT from 04/08/2011.

Stomach/Bowel: Prominent stool throughout the colon favors
constipation. Descending and sigmoid colon diverticulosis.

Vascular/Lymphatic: Aortoiliac atherosclerotic vascular disease.
Splenic artery atherosclerotic calcification. No pathologic
adenopathy observed.

Reproductive: Uterus absent.  Adnexa unremarkable.

Other: No supplemental non-categorized findings.

Musculoskeletal: Mild chronic bilateral sacroiliitis. Grade 1
degenerative anterolisthesis at L4-5 with multilevel spondylosis and
degenerative disc disease causing impingement at L2-3, L3-4, L4-5,
and possibly L5-S1.
IMPRESSION: 1. No stones identified.
2. There are 2 nonspecific lesions of the left kidney. One of these
measures fluid density in the left kidney upper pole, likely a cyst
but not visible on the prior contrast-enhanced CT from 04/08/2011.
The other is a hyperdense partially exophytic lesion of the left
kidney lower pole measuring 1.3 cm in diameter, likewise not visible
on the prior abdomen CT from 04/08/2011. Bosniak categorization
cannot be applied given the lack of IV contrast administration.
Consider renal protocol CT or MRI for definitive characterization
especially if the patient has hematuria.
3.  Prominent stool throughout the colon favors constipation.
4.  Aortoiliac atherosclerotic vascular disease.
5. Minimal scarring in both lower lobes.
6. No characteristic findings for metastatic breast cancer.
7. Lumbar spondylosis and degenerative disc disease causing
multilevel lumbar impingement. Mild chronic bilateral sacroiliitis.

## 2018-07-26 ENCOUNTER — Other Ambulatory Visit: Payer: Self-pay | Admitting: *Deleted

## 2018-07-26 MED ORDER — HYDROCODONE-ACETAMINOPHEN 5-325 MG PO TABS: ORAL_TABLET | ORAL | 0 refills | Status: DC

## 2018-07-26 NOTE — Telephone Encounter (Signed)
Received call from patient daughter [name].   Requested refill on Hydrocodone/APAP.  Ok to refill??  Last office visit/ refill 05/03/2018.

## 2018-08-16 ENCOUNTER — Other Ambulatory Visit: Payer: Self-pay | Admitting: "Endocrinology

## 2018-08-27 ENCOUNTER — Other Ambulatory Visit: Payer: Self-pay | Admitting: "Endocrinology

## 2018-08-31 ENCOUNTER — Other Ambulatory Visit: Payer: Self-pay | Admitting: Family Medicine

## 2018-09-15 ENCOUNTER — Other Ambulatory Visit: Payer: Self-pay | Admitting: Family Medicine

## 2018-09-24 ENCOUNTER — Other Ambulatory Visit: Payer: Self-pay

## 2018-09-24 ENCOUNTER — Ambulatory Visit: Payer: Medicare PPO | Admitting: Cardiology

## 2018-09-24 ENCOUNTER — Encounter: Payer: Self-pay | Admitting: Cardiology

## 2018-09-24 VITALS — BP 170/90 | HR 72 | Ht 66.0 in | Wt 221.0 lb

## 2018-09-24 DIAGNOSIS — I25119 Atherosclerotic heart disease of native coronary artery with unspecified angina pectoris: Secondary | ICD-10-CM

## 2018-09-24 DIAGNOSIS — I10 Essential (primary) hypertension: Secondary | ICD-10-CM

## 2018-09-24 DIAGNOSIS — E782 Mixed hyperlipidemia: Secondary | ICD-10-CM

## 2018-09-24 DIAGNOSIS — Z8679 Personal history of other diseases of the circulatory system: Secondary | ICD-10-CM

## 2018-09-24 NOTE — Patient Instructions (Signed)

## 2018-09-24 NOTE — Progress Notes (Signed)
Cardiology Office Note  Date: 09/24/2018   ID: Robin [name], DOB Jul 07, 1940, MRN [id number]  PCP:  Alycia Rossetti, MD  Cardiologist:  Rozann Lesches, MD Electrophysiologist:  None   Chief Complaint  Patient presents with  . Cardiac follow-up    History of Present Illness: Robin [name] is an 78 y.o. female last seen in December 2019.  She is here for a routine visit with her daughter today.  She states that she has been staying around her house, has only been out a few times and wears a mask.  She does not report any worsening shortness of breath beyond NYHA class II with typical activities.  No chest pain, palpitations, or syncope.  I reviewed her medications which are stable and outlined below.  She reports no intolerances and states that she continues to see Dr. Buelah Manis.  Last echocardiogram from 2017 is outlined below.  We discussed obtaining a follow-up study.  I personally reviewed her ECG today which shows sinus bradycardia with nonspecific T wave changes.  Past Medical History:  Diagnosis Date  . Breast cancer ([place])    Tamoxifen started 2007  . CAD (coronary artery disease)    DES second diagonal 2/12  . Cardiomyopathy    Probable Takotsubo, LVEF 30-35% 2/12  . Carotid artery disease ([place])   . Essential hypertension   . Hepatic steatosis   . Hyperlipidemia   . Hypothyroidism   . NSTEMI (non-ST elevated myocardial infarction) ([place])    2/12  . Pancreas divisum   . Stroke ([place])    (2000) residual left-sided weakness  . Type 2 diabetes mellitus ([place])     Past Surgical History:  Procedure Laterality Date  . ABDOMINAL HYSTERECTOMY    . CAROTID ENDARTERECTOMY     Left  . CHOLECYSTECTOMY    . JOINT REPLACEMENT     [facility]  . MASTECTOMY     Bilateral    Current Outpatient Medications  Medication Sig Dispense Refill  . aspirin 81 MG tablet Take 81 mg by mouth daily.      . carvedilol (COREG) 12.5 MG tablet TAKE  1.5 TABLETS BY MOUTH TWICE DAILY 90 tablet 5  . Cholecalciferol (VITAMIN D3) 125 MCG (5000 UT) CAPS TAKE 1 CAPSULE BY MOUTH ONCE DAILY 90 capsule 1  . clopidogrel (PLAVIX) 75 MG tablet TAKE 1 TABLET BY MOUTH ONCE DAILY 90 tablet 1  . docusate sodium (COLACE) 100 MG capsule Take 100 mg by mouth as needed for mild constipation.    Marland Kitchen HYDROcodone-acetaminophen (NORCO) 5-325 MG tablet Take 1.5 tablet to 1 full tablet twice daily as needed. 45 tablet 0  . ibuprofen (ADVIL,MOTRIN) 600 MG tablet Take 600 mg by mouth every 6 (six) hours as needed for fever or headache.     . levothyroxine (SYNTHROID) 100 MCG tablet TAKE 1 TABLET BY MOUTH EVERY DAY BEFORE BREAKFAST 90 tablet 0  . losartan-hydrochlorothiazide (HYZAAR) 50-12.5 MG tablet TAKE 1 TABLET BY MOUTH EVERY DAY 30 tablet 5  . nitroGLYCERIN (NITROSTAT) 0.4 MG SL tablet Place 1 tablet (0.4 mg total) under the tongue every 5 (five) minutes x 3 doses as needed. 25 tablet 3  . Omega-3 Fatty Acids 300 MG CAPS Take 1 capsule (300 mg total) by mouth 2 (two) times daily.    . rosuvastatin (CRESTOR) 40 MG tablet TAKE 1 TABLET BY MOUTH ONCE DAILY 90 tablet 2  . amLODipine (NORVASC) 10 MG tablet TAKE 1 TABLET BY MOUTH  ONCE DAILY 90 tablet 2   No current facility-administered medications for this visit.    Allergies:  Codeine   Social History: The patient  reports that she has never smoked. She has never used smokeless tobacco. She reports that she does not drink alcohol or use drugs.   ROS:  Please see the history of present illness. Otherwise, complete review of systems is positive for none.  All other systems are reviewed and negative.   Physical Exam: VS:  BP (!) 170/90   Pulse 72   Ht _0  (1.676 m)   Wt 221 lb (100.2 kg)   SpO2 96%   BMI 35.67 kg/m , BMI Body mass index is 35.67 kg/m.  Wt Readings from Last 3 Encounters:  09/24/18 221 lb (100.2 kg)  01/04/18 229 lb (103.9 kg)  12/28/17 227 lb (103 kg)    General: Patient appears comfortable  at rest. HEENT: Conjunctiva and lids normal, wearing a mask. Neck: Supple, no elevated JVP or carotid bruits, no thyromegaly. Lungs: Clear to auscultation, nonlabored breathing at rest. Cardiac: Regular rate and rhythm, no S3, 2/6 systolic murmur. Abdomen: Soft, nontender, bowel sounds present. Extremities: Increased adipose tissue in arms and legs with mild distal edema, distal pulses 2+. Skin: Warm and dry. Musculoskeletal: No kyphosis. Neuropsychiatric: Alert and oriented x3, affect grossly appropriate.  ECG:  An ECG dated 07/13/2017 was personally reviewed today and demonstrated:  Sinus bradycardia with PAC, nonspecific T wave changes.  Recent Labwork:    Component Value Date/Time   CHOL 126 08/31/2017 1004   TRIG 102 08/31/2017 1004   HDL 40 (L) 08/31/2017 1004   CHOLHDL 3.2 08/31/2017 1004   VLDL 20 04/08/2016 0910   LDLCALC 67 08/31/2017 1004    Other Studies Reviewed Today:  Echocardiogram 04/27/2015: Study Conclusions  - Left ventricle: The cavity size was normal. Wall thickness was   increased in a pattern of severe LVH. Systolic function was   normal. The estimated ejection fraction was in the range of 60%   to 65%. Wall motion was normal; there were no regional wall   motion abnormalities. Doppler parameters are consistent with   abnormal left ventricular relaxation (grade 1 diastolic   dysfunction). Doppler parameters are consistent with high   ventricular filling pressure. - Aortic valve: Mildly calcified annulus. Trileaflet; mildly   calcified leaflets. - Mitral valve: Calcified annulus. There was trivial regurgitation. - Left atrium: The atrium was mildly dilated. - Right atrium: Central venous pressure (est): 3 mm Hg. - Tricuspid valve: There was trivial regurgitation. - Pulmonary arteries: PA peak pressure: 26 mm Hg (S). - Pericardium, extracardiac: There was no pericardial effusion.  Impressions:  - Severe LVH with LVEF 60-65%. Grade 1 diastolic  dysfunction with   increased LV filling pressures. Mild left atrial enlargement. MAC   with trivial mitral regurgitation. Mildly sclerotic aortic valve.   Trivial tricuspid regurgitation with PASP 26 mmHg mmHg.  Assessment and Plan:  1.  History of stress-induced cardiomyopathy, LVEF normal range by echocardiogram in 2017 on medical therapy.  We will obtain a follow-up study to ensure no need for change in medications.  ECG reviewed and stable.  2.  Essential hypertension, blood pressure is elevated today.  She continues to follow with Dr. Buelah Manis and remains on stable doses of Hyzaar, Coreg, and Norvasc.  3.  Mixed hyperlipidemia on statin therapy, last LDL 67.  4.  CAD status post DES to the second diagonal in 2012.  We are following her conservatively  in the absence of angina symptoms.  Continue aspirin and statin.  Medication Adjustments/Labs and Tests Ordered: Current medicines are reviewed at length with the patient today.  Concerns regarding medicines are outlined above.   Tests Ordered: Orders Placed This Encounter  Procedures  . EKG 12-Lead  . ECHOCARDIOGRAM COMPLETE    Medication Changes: No orders of the defined types were placed in this encounter.   Disposition:  Follow up 6 months in the [place] office.  Signed, Satira Sark, MD, [facility] 09/24/2018 9:40 AM    [facility] [place], [place], [place] [address] Phone: [phone]; Fax: [phone]

## 2018-10-12 ENCOUNTER — Other Ambulatory Visit: Payer: Self-pay

## 2018-10-12 ENCOUNTER — Ambulatory Visit (INDEPENDENT_AMBULATORY_CARE_PROVIDER_SITE_OTHER): Payer: Medicare PPO

## 2018-10-12 ENCOUNTER — Ambulatory Visit: Payer: Medicare PPO

## 2018-10-12 DIAGNOSIS — Z23 Encounter for immunization: Secondary | ICD-10-CM

## 2018-10-12 NOTE — Progress Notes (Signed)
Patient came in today to receive her annual flu vaccine. Fluad was given in the left deltoid. Patient tolerated well. VIS was given

## 2018-10-15 ENCOUNTER — Ambulatory Visit: Payer: Medicare PPO

## 2018-10-21 ENCOUNTER — Other Ambulatory Visit: Payer: Medicare PPO

## 2018-11-14 ENCOUNTER — Other Ambulatory Visit: Payer: Self-pay | Admitting: "Endocrinology

## 2018-11-18 ENCOUNTER — Other Ambulatory Visit: Payer: Medicare PPO

## 2018-11-25 ENCOUNTER — Other Ambulatory Visit: Payer: Medicare PPO

## 2018-12-02 ENCOUNTER — Other Ambulatory Visit: Payer: Self-pay | Admitting: Family Medicine

## 2019-01-07 ENCOUNTER — Other Ambulatory Visit: Payer: Self-pay | Admitting: Family Medicine

## 2019-02-23 ENCOUNTER — Other Ambulatory Visit: Payer: Self-pay | Admitting: "Endocrinology

## 2019-04-11 ENCOUNTER — Telehealth: Payer: Self-pay | Admitting: Cardiology

## 2019-04-11 NOTE — Telephone Encounter (Signed)
Virtual Visit Pre-Appointment Phone Call  "(Name), I am calling you today to discuss your upcoming appointment. We are currently trying to limit exposure to the virus that causes COVID-19 by seeing patients at home rather than in the office."  1. "What is the BEST phone number to call the day of the visit?" - [phone]  2. Do you have or have access to (through a family member/friend) a smartphone with video capability that we can use for your visit?" a. If yes - list this number in appt notes as cell (if different from BEST phone #) and list the appointment type as a VIDEO visit in appointment notes b. If no - list the appointment type as a PHONE visit in appointment notes  3. Confirm consent - "In the setting of the current Covid19 crisis, you are scheduled for a (phone or video) visit with your provider on (date) at (time).  Just as we do with many in-office visits, in order for you to participate in this visit, we must obtain consent.  If you'd like, I can send this to your mychart (if signed up) or email for you to review.  Otherwise, I can obtain your verbal consent now.  All virtual visits are billed to your insurance company just like a normal visit would be.  By agreeing to a virtual visit, we'd like you to understand that the technology does not allow for your provider to perform an examination, and thus may limit your provider's ability to fully assess your condition. If your provider identifies any concerns that need to be evaluated in person, we will make arrangements to do so.  Finally, though the technology is pretty good, we cannot assure that it will always work on either your or our end, and in the setting of a video visit, we may have to convert it to a phone-only visit.  In either situation, we cannot ensure that we have a secure connection.  Are you willing to proceed?" STAFF: Did the patient verbally acknowledge consent to telehealth visit? Document YES/NO here:  YES  4. Advise patient to be prepared - "Two hours prior to your appointment, go ahead and check your blood pressure, pulse, oxygen saturation, and your weight (if you have the equipment to check those) and write them all down. When your visit starts, your provider will ask you for this information. If you have an Apple Watch or Kardia device, please plan to have heart rate information ready on the day of your appointment. Please have a pen and paper handy nearby the day of the visit as well."  5. Give patient instructions for MyChart download to smartphone OR Doximity/Doxy.me as below if video visit (depending on what platform provider is using)  6. Inform patient they will receive a phone call 15 minutes prior to their appointment time (may be from unknown caller ID) so they should be prepared to answer    TELEPHONE CALL NOTE  Robin [name] has been deemed a candidate for a follow-up tele-health visit to limit community exposure during the Covid-19 pandemic. I spoke with the patient via phone to ensure availability of phone/video source, confirm preferred email & phone number, and discuss instructions and expectations.  I reminded Robin [name] to be prepared with any vital sign and/or heart rhythm information that could potentially be obtained via home monitoring, at the time of her visit. I reminded Robin [name] to expect a phone call prior to her visit.  Vicky  T Slaughter 04/11/2019 4:35 PM   INSTRUCTIONS FOR DOWNLOADING THE MYCHART APP TO SMARTPHONE  - The patient must first make sure to have activated MyChart and know their login information - If Apple, go to [employer] and type in MyChart in the search bar and download the app. If Android, ask patient to go to [employer] and type in [place] in the search bar and download the app. The app is free but as with any other app downloads, their phone may require them to verify saved payment information or Apple/Android password.   - The patient will need to then log into the app with their MyChart username and password, and select [facility] as their healthcare provider to link the account. When it is time for your visit, go to the MyChart app, find appointments, and click Begin Video Visit. Be sure to Select Allow for your device to access the Microphone and Camera for your visit. You will then be connected, and your provider will be with you shortly.  **If they have any issues connecting, or need assistance please contact MyChart service desk [phone])**  **If using a computer, in order to ensure the best quality for their visit they will need to use either of the following Internet Browsers: [employer], or Google Chrome**  IF USING DOXIMITY or DOXY.ME - The patient will receive a link just prior to their visit by text.     FULL LENGTH CONSENT FOR TELE-HEALTH VISIT   I hereby voluntarily request, consent and authorize [facility] and its employed or contracted physicians, physician assistants, nurse practitioners or other licensed health care professionals (the Practitioner), to provide me with telemedicine health care services (the Services") as deemed necessary by the treating Practitioner. I acknowledge and consent to receive the Services by the Practitioner via telemedicine. I understand that the telemedicine visit will involve communicating with the Practitioner through live audiovisual communication technology and the disclosure of certain medical information by electronic transmission. I acknowledge that I have been given the opportunity to request an in-person assessment or other available alternative prior to the telemedicine visit and am voluntarily participating in the telemedicine visit.  I understand that I have the right to withhold or withdraw my consent to the use of telemedicine in the course of my care at any time, without affecting my right to future care or treatment, and that  the Practitioner or I may terminate the telemedicine visit at any time. I understand that I have the right to inspect all information obtained and/or recorded in the course of the telemedicine visit and may receive copies of available information for a reasonable fee.  I understand that some of the potential risks of receiving the Services via telemedicine include:   Delay or interruption in medical evaluation due to technological equipment failure or disruption;  Information transmitted may not be sufficient (e.g. poor resolution of images) to allow for appropriate medical decision making by the Practitioner; and/or   In rare instances, security protocols could fail, causing a breach of personal health information.  Furthermore, I acknowledge that it is my responsibility to provide information about my medical history, conditions and care that is complete and accurate to the best of my ability. I acknowledge that Practitioner's advice, recommendations, and/or decision may be based on factors not within their control, such as incomplete or inaccurate data provided by me or distortions of diagnostic images or specimens that may result from electronic transmissions. I understand that the practice  of medicine is not an Chief Strategy Officer and that Practitioner makes no warranties or guarantees regarding treatment outcomes. I acknowledge that I will receive a copy of this consent concurrently upon execution via email to the email address I last provided but may also request a printed copy by calling the office of [facility].    I understand that my insurance will be billed for this visit.   I have read or had this consent read to me.  I understand the contents of this consent, which adequately explains the benefits and risks of the Services being provided via telemedicine.   I have been provided ample opportunity to ask questions regarding this consent and the Services and have had my questions answered to  my satisfaction.  I give my informed consent for the services to be provided through the use of telemedicine in my medical care  By participating in this telemedicine visit I agree to the above.

## 2019-04-25 ENCOUNTER — Telehealth: Payer: Self-pay | Admitting: Cardiology

## 2019-04-25 ENCOUNTER — Telehealth (INDEPENDENT_AMBULATORY_CARE_PROVIDER_SITE_OTHER): Payer: Medicare PPO | Admitting: Cardiology

## 2019-04-25 ENCOUNTER — Encounter: Payer: Self-pay | Admitting: Cardiology

## 2019-04-25 VITALS — BP 142/50 | Ht 66.0 in | Wt 230.0 lb

## 2019-04-25 DIAGNOSIS — Z8679 Personal history of other diseases of the circulatory system: Secondary | ICD-10-CM

## 2019-04-25 DIAGNOSIS — I25119 Atherosclerotic heart disease of native coronary artery with unspecified angina pectoris: Secondary | ICD-10-CM

## 2019-04-25 DIAGNOSIS — I10 Essential (primary) hypertension: Secondary | ICD-10-CM | POA: Diagnosis not present

## 2019-04-25 NOTE — Addendum Note (Signed)
Addended by: Julian Hy T on: 04/25/2019 11:07 AM   Modules accepted: Orders

## 2019-04-25 NOTE — Progress Notes (Signed)
Virtual Visit via Telephone Note   This visit type was conducted due to national recommendations for restrictions regarding the COVID-19 Pandemic (e.g. social distancing) in an effort to limit this patient's exposure and mitigate transmission in our community.  Due to her co-morbid illnesses, this patient is at least at moderate risk for complications without adequate follow up.  This format is felt to be most appropriate for this patient at this time.  The patient did not have access to video technology/had technical difficulties with video requiring transitioning to audio format only (telephone).  All issues noted in this document were discussed and addressed.  No physical exam could be performed with this format.  Please refer to the patient's chart for her  consent to telehealth for [facility]   The patient was identified using 2 identifiers.  Date:  04/25/2019   ID:  EH SAUSEDA, DOB 11-08-1940, MRN [id number]  Patient Location: Home Provider Location: Office  PCP:  Alycia Rossetti, MD  Cardiologist:  Rozann Lesches, MD Electrophysiologist:  None   Evaluation Performed:  Follow-Up Visit  Chief Complaint:  Cardiac follow-up  History of Present Illness:    Robin [name] is an 79 y.o. female last seen in September 2020.  We spoke by phone today.  She tells me that she has been doing relatively well, does not report any chest pain or use of nitroglycerin.  Stable NYHA class II dyspnea with basic ADLs.  Follow-up echocardiogram is pending.  She has preferred to hold off on getting this done, we will plan on a repeat study prior to her next office visit.  I reviewed her current medications.  Aspirin, Norvasc, Coreg, Plavix, Hyzaar, and Crestor.  She has as needed nitroglycerin available.  The patient does not have symptoms concerning for COVID-19 infection (fever, chills, cough, or new shortness of breath).  I did talk with her about the vaccine today, she has been  somewhat hesitant and still considering the matter.   Past Medical History:  Diagnosis Date  . Breast cancer ([place])    Tamoxifen started 2007  . CAD (coronary artery disease)    DES second diagonal 2/12  . Cardiomyopathy    Probable Takotsubo, LVEF 30-35% 2/12  . Carotid artery disease ([place])   . Essential hypertension   . Hepatic steatosis   . Hyperlipidemia   . Hypothyroidism   . NSTEMI (non-ST elevated myocardial infarction) ([place])    2/12  . Pancreas divisum   . Stroke ([place])    (2000) residual left-sided weakness  . Type 2 diabetes mellitus ([place])    Past Surgical History:  Procedure Laterality Date  . ABDOMINAL HYSTERECTOMY    . CAROTID ENDARTERECTOMY     Left  . CHOLECYSTECTOMY    . JOINT REPLACEMENT     [facility]  . MASTECTOMY     Bilateral     Current Meds  Medication Sig  . amLODipine (NORVASC) 10 MG tablet TAKE 1 TABLET BY MOUTH ONCE DAILY  . aspirin 81 MG tablet Take 81 mg by mouth daily.    . carvedilol (COREG) 12.5 MG tablet TAKE 1.5 TABLETS BY MOUTH TWICE DAILY  . Cholecalciferol (VITAMIN D3) 125 MCG (5000 UT) CAPS TAKE 1 CAPSULE BY MOUTH ONCE DAILY  . clopidogrel (PLAVIX) 75 MG tablet TAKE 1 TABLET BY MOUTH ONCE DAILY  . docusate sodium (COLACE) 100 MG capsule Take 100 mg by mouth as needed for mild constipation.  Marland Kitchen HYDROcodone-acetaminophen ([place])  5-325 MG tablet Take 1.5 tablet to 1 full tablet twice daily as needed.  Marland Kitchen ibuprofen (ADVIL,MOTRIN) 600 MG tablet Take 600 mg by mouth every 6 (six) hours as needed for fever or headache.   . levothyroxine (SYNTHROID) 100 MCG tablet TAKE 1 TABLET BY MOUTH EVERY DAY BEFORE BREAKFAST  . losartan-hydrochlorothiazide (HYZAAR) 50-12.5 MG tablet TAKE 1 TABLET BY MOUTH EVERY DAY  . nitroGLYCERIN (NITROSTAT) 0.4 MG SL tablet Place 1 tablet (0.4 mg total) under the tongue every 5 (five) minutes x 3 doses as needed.  . Omega-3 Fatty Acids 300 MG CAPS Take 1 capsule (300 mg total) by  mouth 2 (two) times daily.  . rosuvastatin (CRESTOR) 40 MG tablet TAKE 1 TABLET BY MOUTH ONCE DAILY     Allergies:   Codeine   ROS:   No palpitations or syncope.  Prior CV studies:   The following studies were reviewed today:  Echocardiogram 04/27/2015: Study Conclusions  - Left ventricle: The cavity size was normal. Wall thickness was increased in a pattern of severe LVH. Systolic function was normal. The estimated ejection fraction was in the range of 60% to 65%. Wall motion was normal; there were no regional wall motion abnormalities. Doppler parameters are consistent with abnormal left ventricular relaxation (grade 1 diastolic dysfunction). Doppler parameters are consistent with high ventricular filling pressure. - Aortic valve: Mildly calcified annulus. Trileaflet; mildly calcified leaflets. - Mitral valve: Calcified annulus. There was trivial regurgitation. - Left atrium: The atrium was mildly dilated. - Right atrium: Central venous pressure (est): 3 mm Hg. - Tricuspid valve: There was trivial regurgitation. - Pulmonary arteries: PA peak pressure: 26 mm Hg (S). - Pericardium, extracardiac: There was no pericardial effusion.  Impressions:  - Severe LVH with LVEF 60-65%. Grade 1 diastolic dysfunction with increased LV filling pressures. Mild left atrial enlargement. MAC with trivial mitral regurgitation. Mildly sclerotic aortic valve. Trivial tricuspid regurgitation with PASP 26 mmHg mmHg.  Labs/Other Tests and Data Reviewed:    EKG:  An ECG dated 09/24/2018 was personally reviewed today and demonstrated:  Sinus bradycardia with nonspecific T wave changes.  Recent Labs: Lab Results  Component Value Date/Time   CHOL 126 08/31/2017 10:04 AM   TRIG 102 08/31/2017 10:04 AM   HDL 40 (L) 08/31/2017 10:04 AM   CHOLHDL 3.2 08/31/2017 10:04 AM   LDLCALC 67 08/31/2017 10:04 AM    Wt Readings from Last 3 Encounters:  04/25/19 230 lb (104.3 kg)   09/24/18 221 lb (100.2 kg)  01/04/18 229 lb (103.9 kg)     Objective:    Vital Signs:  BP (!) 142/50   Ht _0  (1.676 m)   Wt 230 lb (104.3 kg)   BMI 37.12 kg/m    Patient spoke in full sentences, not short of breath. No wheezing or coughing.  ASSESSMENT & PLAN:    1.  History of stress-induced cardiomyopathy.  She has been clinically stable on medical therapy.  We will obtain a follow-up echocardiogram prior to her next visit in 6 months.  Previous study was in 2017.  2.  Essential hypertension, systolic is in the 147W today.  Continue Norvasc, Coreg, and Hyzaar.  3.  CAD status post DES to the second diagonal in 2012.  No progressive angina symptoms on medical therapy.  Continue with observation.  Time:   Today, I have spent 5 minutes with the patient with telehealth technology discussing the above problems.     Medication Adjustments/Labs and Tests Ordered: Current medicines are reviewed  at length with the patient today.  Concerns regarding medicines are outlined above.   Tests Ordered: No orders of the defined types were placed in this encounter.   Medication Changes: No orders of the defined types were placed in this encounter.   Follow Up:  In Person 6 months in the [place] office.  Signed, Rozann Lesches, MD  04/25/2019 10:46 AM    [place]

## 2019-04-25 NOTE — Telephone Encounter (Signed)
  Precert needed for:  Echo  Location:   [facility]  Date: Sept 30, 2021

## 2019-04-25 NOTE — Patient Instructions (Signed)
Your physician wants you to follow-up in: [place] will receive a reminder letter in the mail two months in advance. If you don't receive a letter, please call our office to schedule the follow-up appointment.  Your physician recommends that you continue on your current medications as directed. Please refer to the Current Medication list given to you today.  Your physician has requested that you have an echocardiogram [facility] Echocardiography is a painless test that uses sound waves to create images of your heart. It provides your doctor with information about the size and shape of your heart and how well your heart's chambers and valves are working. This procedure takes approximately one hour. There are no restrictions for this procedure.  Thank you for choosing [facility]!!

## 2019-05-07 ENCOUNTER — Other Ambulatory Visit: Payer: Self-pay | Admitting: Family Medicine

## 2019-05-13 ENCOUNTER — Other Ambulatory Visit: Payer: Self-pay | Admitting: Family Medicine

## 2019-05-18 ENCOUNTER — Telehealth: Payer: Self-pay | Admitting: *Deleted

## 2019-05-18 ENCOUNTER — Encounter: Payer: Self-pay | Admitting: Family Medicine

## 2019-05-18 ENCOUNTER — Ambulatory Visit (INDEPENDENT_AMBULATORY_CARE_PROVIDER_SITE_OTHER): Payer: Medicare PPO | Admitting: Family Medicine

## 2019-05-18 ENCOUNTER — Other Ambulatory Visit: Payer: Self-pay

## 2019-05-18 VITALS — BP 130/72 | HR 70 | Temp 98.0°F | Resp 16 | Ht 66.0 in | Wt 221.0 lb

## 2019-05-18 DIAGNOSIS — I2510 Atherosclerotic heart disease of native coronary artery without angina pectoris: Secondary | ICD-10-CM | POA: Diagnosis not present

## 2019-05-18 DIAGNOSIS — R319 Hematuria, unspecified: Secondary | ICD-10-CM | POA: Diagnosis not present

## 2019-05-18 DIAGNOSIS — I10 Essential (primary) hypertension: Secondary | ICD-10-CM | POA: Diagnosis not present

## 2019-05-18 DIAGNOSIS — Z0000 Encounter for general adult medical examination without abnormal findings: Secondary | ICD-10-CM

## 2019-05-18 DIAGNOSIS — E782 Mixed hyperlipidemia: Secondary | ICD-10-CM

## 2019-05-18 DIAGNOSIS — R7303 Prediabetes: Secondary | ICD-10-CM

## 2019-05-18 DIAGNOSIS — E039 Hypothyroidism, unspecified: Secondary | ICD-10-CM | POA: Diagnosis not present

## 2019-05-18 DIAGNOSIS — I429 Cardiomyopathy, unspecified: Secondary | ICD-10-CM

## 2019-05-18 DIAGNOSIS — R300 Dysuria: Secondary | ICD-10-CM | POA: Diagnosis not present

## 2019-05-18 DIAGNOSIS — Z0001 Encounter for general adult medical examination with abnormal findings: Secondary | ICD-10-CM

## 2019-05-18 DIAGNOSIS — N390 Urinary tract infection, site not specified: Secondary | ICD-10-CM | POA: Diagnosis not present

## 2019-05-18 LAB — URINALYSIS, ROUTINE W REFLEX MICROSCOPIC
Bilirubin Urine: NEGATIVE
Glucose, UA: NEGATIVE
Hyaline Cast: NONE SEEN /LPF
Ketones, ur: NEGATIVE
Nitrite: NEGATIVE
Specific Gravity, Urine: 1.025 (ref 1.001–1.03)
pH: 5 (ref 5.0–8.0)

## 2019-05-18 LAB — MICROSCOPIC MESSAGE

## 2019-05-18 MED ORDER — CEPHALEXIN 250 MG PO CAPS: 250.0000 mg | ORAL_CAPSULE | Freq: Four times a day (QID) | ORAL | 0 refills | Status: DC

## 2019-05-18 MED ORDER — HYDROCODONE-ACETAMINOPHEN 5-325 MG PO TABS: ORAL_TABLET | ORAL | 0 refills | Status: DC

## 2019-05-18 NOTE — Patient Instructions (Addendum)
F/U 6 months  Take antibiotics Pain medication refilled

## 2019-05-18 NOTE — Telephone Encounter (Signed)
Changed to 1 tab BID

## 2019-05-18 NOTE — Telephone Encounter (Signed)
Call placed to [facility] and Thurmond Butts made aware.

## 2019-05-18 NOTE — Addendum Note (Signed)
Addended by: Vic Blackbird F on: 05/18/2019 02:40 PM   Modules accepted: Orders

## 2019-05-18 NOTE — Progress Notes (Signed)
Subjective:   Patient presents for Medicare Annual/Subsequent preventive examination.     Pt here for cpe    Dysuria for the past 3-4 days. No cramping, no fever, no vomiting    HTN- no headaches, or chest pain , SOB   OA -no falls , norco refill needed    CAD/CVA- taking plavix - no abnormal bleeding,  no  cardic symptoms, she has underlying history cardiomyopathy scheduled for echo this fall by cardiology   Hypothyroidism she has not been able to follow-up with her endocrinologist but she is taking her levothyroxine she needs labs done  Review Past Medical/Family/Social: Per EMR  Risk Factors  Current exercise habits: minimal walking Dietary issues discussed: Yes  Cardiac risk factors: Obesity (BMI >= 30 kg/m2). CAD,   Depression Screen  (Note: if answer to either of the following is "Yes", a more complete depression screening is indicated)  Over the past two weeks, have you felt down, depressed or hopeless? No Over the past two weeks, have you felt little interest or pleasure in doing things? No Have you lost interest or pleasure in daily life? No Do you often feel hopeless? No Do you cry easily over simple problems? No   Activities of Daily Living  In your present state of health, do you have any difficulty performing the following activities?:  Driving?  YES  Managing money? No  Feeding yourself? No  Getting from bed to chair? No  Climbing a flight of stairs? YES  Preparing food and eating?: No  Bathing or showering? No  Getting dressed: No  Getting to the toilet? No  Using the toilet:No  Moving around from place to place: No  In the past year have you fallen or had a near fall?:No  Are you sexually active? No  Do you have more than one partner? No   Hearing Difficulties: occ Do you often ask people to speak up or repeat themselves? No  Do you experience ringing or noises in your ears? yes Do you have difficulty understanding soft or whispered voices?  No  Do you feel that you have a problem with memory? No Do you often misplace items? No  Do you feel safe at home? Yes  Cognitive Testing  Alert? Yes Normal Appearance?Yes  Oriented to person? Yes Place? Yes  Time? Yes  Recall of three objects? Yes  Can perform simple calculations? Yes  Displays appropriate judgment?Yes  Can read the correct time from a watch face?Yes   List the Names of Other Physician/Practitioners you currently use:   urology, cardiology, orthopedics, endocrinology   Screening Tests / Date UTD or aged out Colonoscopy                     pNEUMONIA- utd Mammogram  Shinlges- NOT COVERED  Tetanus/tdap Bone Density UTD COVID-19 due    ROS:  GEN- denies fatigue, fever, weight loss,weakness, recent illness HEENT- denies eye drainage, change in vision, nasal discharge, CVS- denies chest pain, palpitations RESP- denies SOB, cough, wheeze ABD- denies N/V, change in stools, abd pain GU- +dysuria, hematuria, dribbling, incontinence MSK- denies joint pain, muscle aches, injury Neuro- denies headache, dizziness, syncope, seizure activity   PHYSICAL: GEN- NAD, alert and oriented x3 HEENT- PERRL, EOMI, non injected sclera, pink conjunctiva, MMM, oropharynx clear Neck- Supple, no thryomegaly,no bruit  CVS- RRR, 2/6 SEM  , HR 60's RESP-CTAB ABD-NABS,SOFT,NT,ND EXT-NO  edema, chronic lymphedema LUE Pulses- Radial, DP- 2+   Assessment:    Annual  wellness medicare exam   Plan:    During the course of the visit the patient was educated and counseled about appropriate screening and preventive services including:  Discussed advanced directives, given handouts   Preventative medicine UTD, discussed Covid vaccine     Screen neg for depression. PHQ- 9 score of  0   Fasting labs today   Hypertension/coronary artery disease blood pressure is controlled no changes.  Continue Plavix.  Osteoarthritis refilled hydrocodone  Thyroidism check thyroid  function test continue levothyroxine  Urinary tract infection start keflex QID    pre diabetes- recheck A1C     Diet review for nutrition referral? Yes ____ Not Indicated __x__  Patient Instructions (the written plan) was given to the patient.  Medicare Attestation  I have personally reviewed:  The patient's medical and social history  Their use of alcohol, tobacco or illicit drugs  Their current medications and supplements  The patient's functional ability including ADLs,fall risks, home safety risks, cognitive, and hearing and visual impairment  Diet and physical activities  Evidence for depression or mood disorders  The patient's weight, height, BMI, and visual acuity have been recorded in the chart. I have made referrals, counseling, and provided education to the patient based on review of the above and I have provided the patient with a written personalized care plan for preventive services.

## 2019-05-18 NOTE — Telephone Encounter (Signed)
Received call from [place], pharamcist with [facility].   Reports that prescription for Hydrocodone/APAP directions read as follows: Take 1.5- 1 whole tablet BID as needed for pain.   Requested clarification on prescription.   Noted that those directions have been on prescription since 07/26/2018.  MD please advise.

## 2019-05-19 LAB — CBC WITH DIFFERENTIAL/PLATELET
Absolute Monocytes: 585 cells/uL (ref 200–950)
Basophils Absolute: 31 cells/uL (ref 0–200)
Basophils Relative: 0.4 %
Eosinophils Absolute: 203 cells/uL (ref 15–500)
Eosinophils Relative: 2.6 %
HCT: 37.8 % (ref 35.0–45.0)
Hemoglobin: 12.4 g/dL (ref 11.7–15.5)
Lymphs Abs: 1201 cells/uL (ref 850–3900)
MCH: 26.8 pg — ABNORMAL LOW (ref 27.0–33.0)
MCHC: 32.8 g/dL (ref 32.0–36.0)
MCV: 81.8 fL (ref 80.0–100.0)
MPV: 11.3 fL (ref 7.5–12.5)
Monocytes Relative: 7.5 %
Neutro Abs: 5780 cells/uL (ref 1500–7800)
Neutrophils Relative %: 74.1 %
Platelets: 150 10*3/uL (ref 140–400)
RBC: 4.62 10*6/uL (ref 3.80–5.10)
RDW: 15.1 % — ABNORMAL HIGH (ref 11.0–15.0)
Total Lymphocyte: 15.4 %
WBC: 7.8 10*3/uL (ref 3.8–10.8)

## 2019-05-19 LAB — COMPREHENSIVE METABOLIC PANEL
AG Ratio: 1.4 (calc) (ref 1.0–2.5)
ALT: 13 U/L (ref 6–29)
AST: 24 U/L (ref 10–35)
Albumin: 4.5 g/dL (ref 3.6–5.1)
Alkaline phosphatase (APISO): 84 U/L (ref 37–153)
BUN/Creatinine Ratio: 19 (calc) (ref 6–22)
BUN: 24 mg/dL (ref 7–25)
CO2: 28 mmol/L (ref 20–32)
Calcium: 10.6 mg/dL — ABNORMAL HIGH (ref 8.6–10.4)
Chloride: 102 mmol/L (ref 98–110)
Creat: 1.29 mg/dL — ABNORMAL HIGH (ref 0.60–0.88)
Globulin: 3.3 g/dL (calc) (ref 1.9–3.7)
Glucose, Bld: 106 mg/dL — ABNORMAL HIGH (ref 65–99)
Potassium: 3.4 mmol/L — ABNORMAL LOW (ref 3.5–5.3)
Sodium: 140 mmol/L (ref 135–146)
Total Bilirubin: 0.6 mg/dL (ref 0.2–1.2)
Total Protein: 7.8 g/dL (ref 6.1–8.1)

## 2019-05-19 LAB — LIPID PANEL
Cholesterol: 158 mg/dL (ref <200)
HDL: 47 mg/dL — ABNORMAL LOW (ref >=50)
LDL Cholesterol (Calc): 91 mg/dL (calc)
Non-HDL Cholesterol (Calc): 111 mg/dL (calc) (ref <130)
Total CHOL/HDL Ratio: 3.4 (calc) (ref <5.0)
Triglycerides: 107 mg/dL (ref <150)

## 2019-05-19 LAB — HEMOGLOBIN A1C
Hgb A1c MFr Bld: 5.8 % of total Hgb — ABNORMAL HIGH (ref <5.7)
Mean Plasma Glucose: 120 (calc)
eAG (mmol/L): 6.6 (calc)

## 2019-05-19 LAB — T3, FREE: T3, Free: 2.2 pg/mL — ABNORMAL LOW (ref 2.3–4.2)

## 2019-05-19 LAB — T4, FREE: Free T4: 0.9 ng/dL (ref 0.8–1.8)

## 2019-05-19 LAB — TSH: TSH: 18.98 mIU/L — ABNORMAL HIGH (ref 0.40–4.50)

## 2019-06-25 DIAGNOSIS — M1611 Unilateral primary osteoarthritis, right hip: Secondary | ICD-10-CM | POA: Diagnosis not present

## 2019-06-25 DIAGNOSIS — M160 Bilateral primary osteoarthritis of hip: Secondary | ICD-10-CM | POA: Diagnosis not present

## 2019-06-25 DIAGNOSIS — I10 Essential (primary) hypertension: Secondary | ICD-10-CM | POA: Diagnosis not present

## 2019-06-26 DIAGNOSIS — I10 Essential (primary) hypertension: Secondary | ICD-10-CM | POA: Diagnosis not present

## 2019-06-26 DIAGNOSIS — R1013 Epigastric pain: Secondary | ICD-10-CM | POA: Diagnosis not present

## 2019-06-26 DIAGNOSIS — M25552 Pain in left hip: Secondary | ICD-10-CM | POA: Diagnosis not present

## 2019-06-28 ENCOUNTER — Emergency Department (HOSPITAL_COMMUNITY): Payer: Medicare PPO

## 2019-06-28 ENCOUNTER — Encounter (HOSPITAL_COMMUNITY): Payer: Self-pay

## 2019-06-28 ENCOUNTER — Emergency Department (HOSPITAL_COMMUNITY)
Admission: EM | Admit: 2019-06-28 | Discharge: 2019-06-28 | Disposition: A | Discharge status: Home / Self Care | Payer: Medicare PPO | Attending: Emergency Medicine | Admitting: Emergency Medicine

## 2019-06-28 ENCOUNTER — Other Ambulatory Visit: Payer: Self-pay

## 2019-06-28 DIAGNOSIS — Z79899 Other long term (current) drug therapy: Secondary | ICD-10-CM | POA: Diagnosis not present

## 2019-06-28 DIAGNOSIS — Z7982 Long term (current) use of aspirin: Secondary | ICD-10-CM | POA: Diagnosis not present

## 2019-06-28 DIAGNOSIS — E039 Hypothyroidism, unspecified: Secondary | ICD-10-CM | POA: Diagnosis not present

## 2019-06-28 DIAGNOSIS — Z853 Personal history of malignant neoplasm of breast: Secondary | ICD-10-CM | POA: Diagnosis not present

## 2019-06-28 DIAGNOSIS — E119 Type 2 diabetes mellitus without complications: Secondary | ICD-10-CM | POA: Diagnosis not present

## 2019-06-28 DIAGNOSIS — Z7902 Long term (current) use of antithrombotics/antiplatelets: Secondary | ICD-10-CM | POA: Diagnosis not present

## 2019-06-28 DIAGNOSIS — I2510 Atherosclerotic heart disease of native coronary artery without angina pectoris: Secondary | ICD-10-CM | POA: Diagnosis not present

## 2019-06-28 DIAGNOSIS — R300 Dysuria: Secondary | ICD-10-CM | POA: Insufficient documentation

## 2019-06-28 DIAGNOSIS — I10 Essential (primary) hypertension: Secondary | ICD-10-CM | POA: Insufficient documentation

## 2019-06-28 DIAGNOSIS — R110 Nausea: Secondary | ICD-10-CM | POA: Diagnosis present

## 2019-06-28 DIAGNOSIS — Z96651 Presence of right artificial knee joint: Secondary | ICD-10-CM | POA: Diagnosis not present

## 2019-06-28 DIAGNOSIS — I252 Old myocardial infarction: Secondary | ICD-10-CM | POA: Insufficient documentation

## 2019-06-28 DIAGNOSIS — R109 Unspecified abdominal pain: Secondary | ICD-10-CM | POA: Diagnosis not present

## 2019-06-28 DIAGNOSIS — K5730 Diverticulosis of large intestine without perforation or abscess without bleeding: Secondary | ICD-10-CM | POA: Diagnosis not present

## 2019-06-28 LAB — URINALYSIS, ROUTINE W REFLEX MICROSCOPIC
Bilirubin Urine: NEGATIVE
Glucose, UA: NEGATIVE mg/dL
Ketones, ur: NEGATIVE mg/dL
Nitrite: NEGATIVE
Protein, ur: NEGATIVE mg/dL
Specific Gravity, Urine: 1.008 (ref 1.005–1.030)
pH: 7 (ref 5.0–8.0)

## 2019-06-28 LAB — COMPREHENSIVE METABOLIC PANEL
ALT: 18 U/L (ref 0–44)
AST: 26 U/L (ref 15–41)
Albumin: 4.3 g/dL (ref 3.5–5.0)
Alkaline Phosphatase: 75 U/L (ref 38–126)
Anion gap: 11 (ref 5–15)
BUN: 25 mg/dL — ABNORMAL HIGH (ref 8–23)
CO2: 26 mmol/L (ref 22–32)
Calcium: 9.9 mg/dL (ref 8.9–10.3)
Chloride: 102 mmol/L (ref 98–111)
Creatinine, Ser: 1.11 mg/dL — ABNORMAL HIGH (ref 0.44–1.00)
GFR calc Af Amer: 54 mL/min — ABNORMAL LOW (ref >60)
GFR calc non Af Amer: 47 mL/min — ABNORMAL LOW (ref >60)
Glucose, Bld: 111 mg/dL — ABNORMAL HIGH (ref 70–99)
Potassium: 3.8 mmol/L (ref 3.5–5.1)
Sodium: 139 mmol/L (ref 135–145)
Total Bilirubin: 0.5 mg/dL (ref 0.3–1.2)
Total Protein: 8 g/dL (ref 6.5–8.1)

## 2019-06-28 LAB — CBC WITH DIFFERENTIAL/PLATELET
Abs Immature Granulocytes: 0.05 10*3/uL (ref 0.00–0.07)
Basophils Absolute: 0.1 10*3/uL (ref 0.0–0.1)
Basophils Relative: 1 %
Eosinophils Absolute: 0.1 10*3/uL (ref 0.0–0.5)
Eosinophils Relative: 2 %
HCT: 37.6 % (ref 36.0–46.0)
Hemoglobin: 12.2 g/dL (ref 12.0–15.0)
Immature Granulocytes: 1 %
Lymphocytes Relative: 13 %
Lymphs Abs: 1.1 10*3/uL (ref 0.7–4.0)
MCH: 26.6 pg (ref 26.0–34.0)
MCHC: 32.4 g/dL (ref 30.0–36.0)
MCV: 82.1 fL (ref 80.0–100.0)
Monocytes Absolute: 0.5 10*3/uL (ref 0.1–1.0)
Monocytes Relative: 7 %
Neutro Abs: 6.1 10*3/uL (ref 1.7–7.7)
Neutrophils Relative %: 76 %
Platelets: 166 10*3/uL (ref 150–400)
RBC: 4.58 MIL/uL (ref 3.87–5.11)
RDW: 16.3 % — ABNORMAL HIGH (ref 11.5–15.5)
WBC: 7.9 10*3/uL (ref 4.0–10.5)
nRBC: 0 % (ref 0.0–0.2)

## 2019-06-28 LAB — LIPASE, BLOOD: Lipase: 28 U/L (ref 11–51)

## 2019-06-28 MED ORDER — ONDANSETRON HCL 4 MG/2ML IJ SOLN
4.0000 mg | Freq: Once | INTRAMUSCULAR | Status: AC
  Administered 2019-06-28: 4 mg via INTRAVENOUS
  Filled 2019-06-28: qty 2

## 2019-06-28 MED ORDER — AMLODIPINE BESYLATE 5 MG PO TABS
10.0000 mg | ORAL_TABLET | Freq: Once | ORAL | Status: AC
  Administered 2019-06-28: 10 mg via ORAL
  Filled 2019-06-28: qty 2

## 2019-06-28 MED ORDER — IOHEXOL 300 MG/ML  SOLN
100.0000 mL | Freq: Once | INTRAMUSCULAR | Status: AC | PRN
  Administered 2019-06-28: 100 mL via INTRAVENOUS

## 2019-06-28 MED ORDER — MORPHINE SULFATE (PF) 4 MG/ML IV SOLN
4.0000 mg | Freq: Once | INTRAVENOUS | Status: AC
  Administered 2019-06-28: 4 mg via INTRAVENOUS
  Filled 2019-06-28: qty 1

## 2019-06-28 MED ORDER — CARVEDILOL 12.5 MG PO TABS
12.5000 mg | ORAL_TABLET | Freq: Two times a day (BID) | ORAL | Status: DC
  Administered 2019-06-28: 12.5 mg via ORAL
  Filled 2019-06-28: qty 1

## 2019-06-28 MED ORDER — CEPHALEXIN 250 MG PO CAPS
250.0000 mg | ORAL_CAPSULE | Freq: Four times a day (QID) | ORAL | 0 refills | Status: DC
Start: 2019-06-28 — End: 2019-08-23

## 2019-06-28 MED ORDER — SODIUM CHLORIDE 0.9 % IV SOLN: INTRAVENOUS | Status: DC

## 2019-06-28 MED ORDER — PHENAZOPYRIDINE HCL 200 MG PO TABS
200.0000 mg | ORAL_TABLET | Freq: Three times a day (TID) | ORAL | 0 refills | Status: DC
Start: 2019-06-28 — End: 2019-08-23

## 2019-06-28 NOTE — Discharge Instructions (Addendum)
Take the medications as prescribed.  Follow-up with your primary care doctor next week to make sure you are improving

## 2019-06-28 NOTE — ED Provider Notes (Signed)
[facility] EMERGENCY DEPARTMENT Provider Note   CSN: [id number] Arrival date & time: 06/28/19  0813     History Chief Complaint  Patient presents with  . Abdominal Pain  . Flank Pain    Robin [name] is a 79 y.o. female.  HPI   Pt states this weekend she started having trouble with her right hip.  She went to [facility] ED and had xrays and was told she had arthritis.  She went back the next day and she was given a shot and some medication for nausea because she felt sick after medications.  Since then she started having pain with urination, persistent nausea.  No vomiting.  She has been constipated without relief from laxatives.  No fevers.    Pt has HTN but has not taken her meds this am  Past Medical History:  Diagnosis Date  . Breast cancer ([place])    Tamoxifen started 2007  . CAD (coronary artery disease)    DES second diagonal 2/12  . Cardiomyopathy    Probable Takotsubo, LVEF 30-35% 2/12  . Carotid artery disease ([place])   . Essential hypertension   . Hepatic steatosis   . Hyperlipidemia   . Hypothyroidism   . NSTEMI (non-ST elevated myocardial infarction) ([place])    2/12  . Pancreas divisum   . Stroke ([place])    (2000) residual left-sided weakness  . Type 2 diabetes mellitus [facility])     Patient Active Problem List   Diagnosis Date Noted  . Vitamin D deficiency 10/22/2017  . Hyperglycemia 08/31/2017  . Constipation 11/17/2016  . Acute onset of severe vertigo 04/26/2015  . Prediabetes 07/19/2012  . Tinnitus 12/04/2011  . Leg pain 09/19/2011  . Hypothyroidism 09/19/2011  . OA (osteoarthritis) of hip 09/19/2011  . Obesity 09/19/2011  . Nail problem 05/13/2011  . Dizzy spells 05/13/2011  . Paresthesia 05/13/2011  . Carotid artery disease ([place]) 04/24/2011  . Edema 09/17/2010  . Secondary cardiomyopathy ([place]) 04/09/2010  . Coronary atherosclerosis of native coronary artery 04/09/2010  . Essential hypertension, benign 04/09/2010  . Mixed hyperlipidemia 04/09/2010     Past Surgical History:  Procedure Laterality Date  . ABDOMINAL HYSTERECTOMY    . CAROTID ENDARTERECTOMY     Left  . CHOLECYSTECTOMY    . JOINT REPLACEMENT     Right knee [facility]- [facility]. Sebastian [facility]  . MASTECTOMY     Bilateral     OB History    Gravida  6   Para  6   Term  6   Preterm      AB      Living  6     SAB      TAB      Ectopic      Multiple      Live Births              Family History  Problem Relation Age of Onset  . Heart attack Sister   . Heart disease Sister   . Heart attack Brother   . Heart disease Brother     Social History   Tobacco Use  . Smoking status: Never Smoker  . Smokeless tobacco: Never Used  Substance Use Topics  . Alcohol use: No    Alcohol/week: 0.0 standard drinks  . Drug use: No    Home Medications Prior to Admission medications   Medication Sig Start Date End Date Taking? Authorizing Provider  amLODipine (NORVASC) 10 MG tablet TAKE 1 TABLET BY MOUTH ONCE  DAILY 05/21/18   Alycia Rossetti, MD  aspirin 81 MG tablet Take 81 mg by mouth daily.      [provider]  carvedilol (COREG) 12.5 MG tablet TAKE 1.5 TABLETS BY MOUTH TWICE DAILY 01/07/19   Rock, Modena Nunnery, MD  cephALEXin (KEFLEX) 250 MG capsule Take 1 capsule (250 mg total) by mouth 4 (four) times daily. 06/28/19   Dorie Rank, MD  Cholecalciferol (VITAMIN D3) 125 MCG (5000 UT) CAPS TAKE 1 CAPSULE BY MOUTH ONCE DAILY 01/14/18   Cassandria Anger, MD  clopidogrel (PLAVIX) 75 MG tablet TAKE 1 TABLET BY MOUTH ONCE DAILY 05/13/19   Alycia Rossetti, MD  docusate sodium (COLACE) 100 MG capsule Take 100 mg by mouth as needed for mild constipation.    [provider]  HYDROcodone-acetaminophen [facility]) 5-325 MG tablet Take 1 tablet BID prn 05/18/19   Alycia Rossetti, MD  ibuprofen (ADVIL,MOTRIN) 600 MG tablet Take 600 mg by mouth every 6 (six) hours as needed for fever or headache.  02/04/05   [provider]  levothyroxine  (SYNTHROID) 100 MCG tablet TAKE 1 TABLET BY MOUTH EVERY DAY BEFORE BREAKFAST 11/15/18   Cassandria Anger, MD  losartan-hydrochlorothiazide (HYZAAR) 50-12.5 MG tablet TAKE 1 TABLET BY MOUTH EVERY DAY 05/09/19   [place], Modena Nunnery, MD  nitroGLYCERIN (NITROSTAT) 0.4 MG SL tablet Place 1 tablet (0.4 mg total) under the tongue every 5 (five) minutes x 3 doses as needed. 01/11/14   Satira Sark, MD  Omega-3 Fatty Acids 300 MG CAPS Take 1 capsule (300 mg total) by mouth 2 (two) times daily. 04/24/11   Serpe, Burna Forts, PA-C  phenazopyridine (PYRIDIUM) 200 MG tablet Take 1 tablet (200 mg total) by mouth 3 (three) times daily. 06/28/19   Dorie Rank, MD  rosuvastatin (CRESTOR) 40 MG tablet TAKE 1 TABLET BY MOUTH ONCE DAILY 08/31/18   Alycia Rossetti, MD    Allergies    Codeine  Review of Systems   Review of Systems  All other systems reviewed and are negative.   Physical Exam Updated Vital Signs BP (!) 222/91   Pulse 75   Temp 98.5 F (36.9 C) (Oral)   Resp 16   Ht 1.676 m (5\' 6" )   Wt 105.2 kg   SpO2 96%   BMI 37.45 kg/m   Physical Exam Vitals and nursing note reviewed.  Constitutional:      General: She is not in acute distress.    Appearance: She is well-developed.  HENT:     Head: Normocephalic and atraumatic.     Right Ear: External ear normal.     Left Ear: External ear normal.  Eyes:     General: No scleral icterus.       Right eye: No discharge.        Left eye: No discharge.     Conjunctiva/sclera: Conjunctivae normal.  Neck:     Trachea: No tracheal deviation.  Cardiovascular:     Rate and Rhythm: Normal rate and regular rhythm.  Pulmonary:     Effort: Pulmonary effort is normal. No respiratory distress.     Breath sounds: Normal breath sounds. No stridor. No wheezing or rales.  Abdominal:     General: Bowel sounds are normal. There is no distension.     Palpations: Abdomen is soft.     Tenderness: There is abdominal tenderness in the suprapubic area. There  is no guarding or rebound.  Musculoskeletal:  General: No tenderness.     Cervical back: Neck supple.  Skin:    General: Skin is warm and dry.     Findings: No rash.  Neurological:     Mental Status: She is alert.     Cranial Nerves: No cranial nerve deficit (no facial droop, extraocular movements intact, no slurred speech).     Sensory: No sensory deficit.     Motor: No abnormal muscle tone or seizure activity.     Coordination: Coordination normal.     ED Results / Procedures / Treatments   Labs (all labs ordered are listed, but only abnormal results are displayed) Labs Reviewed  COMPREHENSIVE METABOLIC PANEL - Abnormal; Notable for the following components:      Result Value   Glucose, Bld 111 (*)    BUN 25 (*)    Creatinine, Ser 1.11 (*)    GFR calc non Af Amer 47 (*)    GFR calc Af Amer 54 (*)    All other components within normal limits  CBC WITH DIFFERENTIAL/PLATELET - Abnormal; Notable for the following components:   RDW 16.3 (*)    All other components within normal limits  URINALYSIS, ROUTINE W REFLEX MICROSCOPIC - Abnormal; Notable for the following components:   Color, Urine STRAW (*)    Hgb urine dipstick SMALL (*)    Leukocytes,Ua TRACE (*)    Bacteria, UA RARE (*)    All other components within normal limits  URINE CULTURE  LIPASE, BLOOD    EKG EKG Interpretation  Date/Time:  Tuesday June 28 2019 08:38:41 EDT Ventricular Rate:  62 PR Interval:    QRS Duration: 99 QT Interval:  541 QTC Calculation: 550 R Axis:   -11 Text Interpretation: Sinus rhythm Borderline T abnormalities, inferior leads Prolonged QT interval Baseline wander in lead(s) II Since last tracing rate slower Confirmed by Dorie Rank [phone]) on 06/28/2019 8:44:19 AM   Radiology CT ABDOMEN PELVIS W CONTRAST  Result Date: 06/28/2019 CLINICAL DATA:  Diverticulitis suspected EXAM: CT ABDOMEN AND PELVIS WITH CONTRAST TECHNIQUE: Multidetector CT imaging of the abdomen and pelvis was  performed using the standard protocol following bolus administration of intravenous contrast. CONTRAST:  174mL OMNIPAQUE IOHEXOL 300 MG/ML  SOLN COMPARISON:  05/04/2016 FINDINGS: Lower chest: No acute abnormality. Hepatobiliary: No focal liver abnormality is seen. Status post cholecystectomy. Postoperative biliary dilatation. Pancreas: Unremarkable. No pancreatic ductal dilatation or surrounding inflammatory changes. Spleen: Normal in size without significant abnormality. Adrenals/Urinary Tract: Adrenal glands are unremarkable. Kidneys are normal, without renal calculi, solid lesion, or hydronephrosis. Bladder is unremarkable. Stomach/Bowel: Stomach is within normal limits. Appendix is not clearly visualized and may be surgically absent. No evidence of bowel wall thickening, distention, or inflammatory changes. Sigmoid diverticulosis. Vascular/Lymphatic: Aortic atherosclerosis. No enlarged abdominal or pelvic lymph nodes. Reproductive: Status post hysterectomy. Other: No abdominal wall hernia or abnormality. No abdominopelvic ascites. Musculoskeletal: No acute or significant osseous findings. IMPRESSION: 1. Sigmoid diverticulosis without evidence of acute diverticulitis. 2. Status post cholecystectomy and hysterectomy. 3. Aortic Atherosclerosis (ICD10-I70.0). Electronically Signed   By: Eddie Candle M.D.   On: 06/28/2019 11:39    Procedures Procedures (including critical care time)  Medications Ordered in ED Medications  carvedilol (COREG) tablet 12.5 mg (12.5 mg Oral Given 06/28/19 0933)  0.9 %  sodium chloride infusion ( Intravenous New Bag/Given 06/28/19 0918)  amLODipine (NORVASC) tablet 10 mg (10 mg Oral Given 06/28/19 0933)  morphine 4 MG/ML injection 4 mg (4 mg Intravenous Given 06/28/19 0930)  ondansetron (ZOFRAN)  injection 4 mg (4 mg Intravenous Given 06/28/19 0928)  iohexol (OMNIPAQUE) 300 MG/ML solution 100 mL (100 mLs Intravenous Contrast Given 06/28/19 1052)    ED Course  I have reviewed the triage  vital signs and the nursing notes.  Pertinent labs & imaging results that were available during my care of the patient were reviewed by me and considered in my medical decision making (see chart for details).  Clinical Course as of Jun 27 1229  Tue Jun 28, 2019  1021 Laboratory tests show normal white blood cell count.  Electrolyte panel unremarkable.  Lipase normal.  UA with trace leukocyte esterase rare bacteria with 6-10 white blood cells.  Not definitive for UTI.  With her abdominal pain will CT for further evaluation.   [QD]  8264 CT scan shows sigmoid diverticulosis but no diverticulitis.  No signs of bowel obstruction or other acute abnormality   [JK]    Clinical Course User Index [JK] Dorie Rank, MD   MDM Rules/Calculators/A&P                      Patient presented with nausea some questions of constipation and some urinary discomfort.  Patient's laboratory tests were reassuring with the exception of abnormal urinalysis.  CT scan was performed to evaluate for the possibility of diverticulitis or colitis considering her abdominal discomfort.  Any stone was also concerned.  CT scan was performed and no signs of any acute abnormality.  Patient's urinalysis does show a few white blood cells and rare bacteria and since she is having urinary discomfort I will start a course of antibiotics.  Patient's blood pressure was treated with oral medications and then improved.  Blood pressure down in the 160s at the bedside.  Plan on discharge home with outpatient follow-up.   Final Clinical Impression(s) / ED Diagnoses Final diagnoses:  Dysuria  Hypertension, unspecified type    Rx / DC Orders ED Discharge Orders         Ordered    cephALEXin (KEFLEX) 250 MG capsule  4 times daily     06/28/19 1231    phenazopyridine (PYRIDIUM) 200 MG tablet  3 times daily     06/28/19 1231           Dorie Rank, MD 06/28/19 1231

## 2019-06-28 NOTE — ED Triage Notes (Signed)
Pt reports abd pain and r sided back pain since Sat.  Reports went to [facility] and had xrays and was told she has arthritis.  Reports was given a shot.  PT says went back to [facility] because she started feeling nauseated.  PT says still having pain and nausea, no vomiting.  LBM was last Wednesday.  Reports took 2 stool softeners yesterday.  Reports pain and burning with urination.

## 2019-06-29 LAB — URINE CULTURE: Culture: NO GROWTH

## 2019-07-08 ENCOUNTER — Ambulatory Visit: Payer: Medicare PPO | Admitting: Family Medicine

## 2019-07-08 ENCOUNTER — Other Ambulatory Visit: Payer: Self-pay | Admitting: Family Medicine

## 2019-07-11 ENCOUNTER — Other Ambulatory Visit: Payer: Self-pay | Admitting: Family Medicine

## 2019-07-11 ENCOUNTER — Other Ambulatory Visit: Payer: Self-pay | Admitting: "Endocrinology

## 2019-07-11 DIAGNOSIS — Z86010 Personal history of colonic polyps: Secondary | ICD-10-CM | POA: Diagnosis not present

## 2019-07-11 DIAGNOSIS — K5900 Constipation, unspecified: Secondary | ICD-10-CM | POA: Diagnosis not present

## 2019-07-11 DIAGNOSIS — K5790 Diverticulosis of intestine, part unspecified, without perforation or abscess without bleeding: Secondary | ICD-10-CM | POA: Diagnosis not present

## 2019-07-11 DIAGNOSIS — R1013 Epigastric pain: Secondary | ICD-10-CM | POA: Diagnosis not present

## 2019-07-11 DIAGNOSIS — R10816 Epigastric abdominal tenderness: Secondary | ICD-10-CM | POA: Diagnosis not present

## 2019-07-11 DIAGNOSIS — R110 Nausea: Secondary | ICD-10-CM | POA: Diagnosis not present

## 2019-07-20 ENCOUNTER — Other Ambulatory Visit: Payer: Self-pay

## 2019-07-20 MED ORDER — LEVOTHYROXINE SODIUM 100 MCG PO TABS: ORAL_TABLET | ORAL | 1 refills | Status: DC

## 2019-08-11 ENCOUNTER — Other Ambulatory Visit: Payer: Self-pay | Admitting: "Endocrinology

## 2019-08-11 DIAGNOSIS — E039 Hypothyroidism, unspecified: Secondary | ICD-10-CM

## 2019-08-16 ENCOUNTER — Other Ambulatory Visit: Payer: Self-pay | Admitting: "Endocrinology

## 2019-08-16 DIAGNOSIS — E039 Hypothyroidism, unspecified: Secondary | ICD-10-CM | POA: Diagnosis not present

## 2019-08-17 LAB — TSH: TSH: 17.27 mIU/L — ABNORMAL HIGH (ref 0.40–4.50)

## 2019-08-17 LAB — T4, FREE: Free T4: 0.7 ng/dL — ABNORMAL LOW (ref 0.8–1.8)

## 2019-08-23 ENCOUNTER — Encounter: Payer: Self-pay | Admitting: Nurse Practitioner

## 2019-08-23 ENCOUNTER — Ambulatory Visit (INDEPENDENT_AMBULATORY_CARE_PROVIDER_SITE_OTHER): Payer: Medicare PPO | Admitting: Nurse Practitioner

## 2019-08-23 ENCOUNTER — Other Ambulatory Visit: Payer: Self-pay

## 2019-08-23 VITALS — BP 191/71 | HR 59 | Ht 66.0 in | Wt 223.8 lb

## 2019-08-23 DIAGNOSIS — E559 Vitamin D deficiency, unspecified: Secondary | ICD-10-CM | POA: Diagnosis not present

## 2019-08-23 DIAGNOSIS — E039 Hypothyroidism, unspecified: Secondary | ICD-10-CM | POA: Diagnosis not present

## 2019-08-23 MED ORDER — LEVOTHYROXINE SODIUM 112 MCG PO TABS: ORAL_TABLET | ORAL | 2 refills | Status: DC

## 2019-08-23 NOTE — Patient Instructions (Signed)
-   The correct intake of thyroid hormone (Levothyroxine, Synthroid), is on empty stomach first thing in the morning, with water, separated by at least 30 minutes from breakfast and other medications,  and separated by more than 4 hours from calcium, iron, multivitamins, acid reflux medications (PPIs). ° °- This medication is a life-long medication and will be needed to correct thyroid hormone imbalances for the rest of your life.  The dose may change from time to time, based on thyroid blood work. ° °- It is extremely important to be consistent taking this medication, near the same time each morning. °

## 2019-08-23 NOTE — Progress Notes (Signed)
Endocrinology follow-up note   Subjective:    Patient ID: Robin [name], female    DOB: 06-Dec-1940, PCP Alycia Rossetti, MD Patient presents today for follow-up of hypothyroidism.  Has been taking levothyroxine 100 mcg p.o. daily before breakfast.  Past Medical History:  Diagnosis Date   Breast cancer ([place])    Tamoxifen started 2007   CAD (coronary artery disease)    DES second diagonal 2/12   Cardiomyopathy    Probable Takotsubo, LVEF 30-35% 2/12   Carotid artery disease ([place])    Essential hypertension    Hepatic steatosis    Hyperlipidemia    Hypothyroidism    NSTEMI (non-ST elevated myocardial infarction) ([place])    2/12   Pancreas divisum    Stroke ([place])    (2000) residual left-sided weakness   Type 2 diabetes mellitus ([facility])    Past Surgical History:  Procedure Laterality Date   ABDOMINAL HYSTERECTOMY     CAROTID ENDARTERECTOMY     Left   CHOLECYSTECTOMY     JOINT REPLACEMENT     Right [facility]- [facility]   MASTECTOMY     Bilateral   Social History   Socioeconomic History   Marital status: Widowed    Spouse name: Not on file   Number of children: Not on file   Years of education: Not on file   Highest education level: Not on file  Occupational History   Not on file  Tobacco Use   Smoking status: Never Smoker   Smokeless tobacco: Never Used  Vaping Use   Vaping Use: Never used  Substance and Sexual Activity   Alcohol use: No    Alcohol/week: 0.0 standard drinks   Drug use: No   Sexual activity: Not on file  Other Topics Concern   Not on file  Social History Narrative   Not on file   Social Determinants of Health   Financial Resource Strain:    Difficulty of Paying Living Expenses:   Food Insecurity:    Worried About Charity fundraiser in the Last Year:    Arboriculturist in the Last Year:   Transportation Needs:    Film/video editor (Medical):    Lack of Transportation  (Non-Medical):   Physical Activity:    Days of Exercise per Week:    Minutes of Exercise per Session:   Stress:    Feeling of Stress :   Social Connections:    Frequency of Communication with Friends and Family:    Frequency of Social Gatherings with Friends and Family:    Attends Religious Services:    Active Member of Clubs or Organizations:    Attends Archivist Meetings:    Marital Status:    Outpatient Encounter Medications as of 08/23/2019  Medication Sig   amLODipine (NORVASC) 10 MG tablet TAKE 1 TABLET BY MOUTH ONCE DAILY   aspirin 81 MG tablet Take 81 mg by mouth daily.     carvedilol (COREG) 12.5 MG tablet TAKE 1.5 TABLETS BY MOUTH TWICE DAILY   Cholecalciferol (VITAMIN D3) 125 MCG (5000 UT) CAPS TAKE 1 CAPSULE BY MOUTH ONCE DAILY   clopidogrel (PLAVIX) 75 MG tablet TAKE 1 TABLET BY MOUTH ONCE DAILY   docusate sodium (COLACE) 100 MG capsule Take 100 mg by mouth as needed for mild constipation.   ibuprofen (ADVIL,MOTRIN) 600 MG tablet Take 600 mg by mouth every 6 (six) hours as needed for fever or headache.  levothyroxine (SYNTHROID) 112 MCG tablet TAKE 1 TABLET BY MOUTH EVERY DAY BEFORE BREAKFAST   losartan-hydrochlorothiazide (HYZAAR) 50-12.5 MG tablet TAKE 1 TABLET BY MOUTH EVERY DAY   nitroGLYCERIN (NITROSTAT) 0.4 MG SL tablet Place 1 tablet (0.4 mg total) under the tongue every 5 (five) minutes x 3 doses as needed.   Omega-3 Fatty Acids 300 MG CAPS Take 1 capsule (300 mg total) by mouth 2 (two) times daily.   pantoprazole (PROTONIX) 40 MG tablet    polyethylene glycol powder (GLYCOLAX/MIRALAX) 17 GM/SCOOP powder SMARTSIG:2 Tablespoon By Mouth Every Night   rosuvastatin (CRESTOR) 40 MG tablet TAKE 1 TABLET BY MOUTH ONCE DAILY   traMADol (ULTRAM) 50 MG tablet Take 50 mg by mouth every 6 (six) hours as needed.   [DISCONTINUED] cephALEXin (KEFLEX) 250 MG capsule Take 1 capsule (250 mg total) by mouth 4 (four) times daily.   [DISCONTINUED]  HYDROcodone-acetaminophen (NORCO) 5-325 MG tablet Take 1 tablet BID prn   [DISCONTINUED] levothyroxine (SYNTHROID) 100 MCG tablet TAKE 1 TABLET BY MOUTH EVERY DAY BEFORE BREAKFAST   [DISCONTINUED] phenazopyridine (PYRIDIUM) 200 MG tablet Take 1 tablet (200 mg total) by mouth 3 (three) times daily.   No facility-administered encounter medications on file as of 08/23/2019.   ALLERGIES: Allergies  Allergen Reactions   Codeine Nausea And Vomiting    VACCINATION STATUS: Immunization History  Administered Date(s) Administered   Fluad Quad(high Dose 65+) 10/12/2018   Influenza, High Dose Seasonal PF 11/17/2016, 12/28/2017   Influenza,inj,Quad PF,6+ Mos 10/31/2014, 11/20/2015   Pneumococcal Conjugate-13 12/01/2012    HPI - 79-yr old female patient with medical history as above. She is being seen in follow-up for hypothyroidism with repeat thyroid function test.   -she is currently on Synthroid 100 mcg p.o. every morning.  She has no complaints today.  - She denies heat/cold intolerance. She denies palpitations nor tremors.   She denies any history of goiter. She has family history of thyroid dysfunction in one of her daughters. She denies any family history of thyroid cancer. -She reports compliance to her thyroid hormone.  Review of Systems  Constitutional:  no fatigue, no subjective hyperthermia, no subjective hypothermia Eyes: no blurry vision, no xerophthalmia ENT: no sore throat, no nodules palpated in throat, no dysphagia/odynophagia, no hoarseness Cardiovascular: no chest pain, no palpitations.   Respiratory: no cough, no SOB Gastrointestinal: no Nausea/Vomiting/Diarhhea Musculoskeletal: no muscle/joint aches Skin: no rashes Neurological: no tremors, no numbness, no dizziness.   Psychiatric: no depression, no anxiety  Objective:    BP (!) 191/71 (BP Location: Right Arm, Patient Position: Sitting)    Pulse (!) 59    Ht 5\' 6"  (1.676 m)    Wt 223 lb 12.8 oz (101.5 kg)     BMI 36.12 kg/m   Wt Readings from Last 3 Encounters:  08/23/19 223 lb 12.8 oz (101.5 kg)  06/28/19 232 lb (105.2 kg)  05/18/19 221 lb (100.2 kg)     Physical Exam- Limited  Constitutional:  Body mass index is 36.12 kg/m. , not in acute distress, normal state of mind Eyes:  EOMI, no exophthalmos Neck: Supple Thyroid: No gross goiter Cardiovascular: RRR, no murmers, rubs, or gallops, no edema Respiratory: Adequate breathing efforts, no crackles, rales, rhonchi, or wheezing Musculoskeletal: no gross deformities, strength intact in all four extremities, no gross restriction of joint movements Skin:  no rashes, no hyperemia Neurological: no tremor with outstretched hands    Recent Results (from the past 2160 hour(s))  Urinalysis, Routine w reflex microscopic  Status: Abnormal   Collection Time: 06/28/19  9:27 AM  Result Value Ref Range   Color, Urine STRAW (A) YELLOW   APPearance CLEAR CLEAR   Specific Gravity, Urine 1.008 1.005 - 1.030   pH 7.0 5.0 - 8.0   Glucose, UA NEGATIVE NEGATIVE mg/dL   Hgb urine dipstick SMALL (A) NEGATIVE   Bilirubin Urine NEGATIVE NEGATIVE   Ketones, ur NEGATIVE NEGATIVE mg/dL   Protein, ur NEGATIVE NEGATIVE mg/dL   Nitrite NEGATIVE NEGATIVE   Leukocytes,Ua TRACE (A) NEGATIVE   RBC / HPF 0-5 0 - 5 RBC/hpf   WBC, UA 0-5 0 - 5 WBC/hpf   Bacteria, UA RARE (A) NONE SEEN   Squamous Epithelial / LPF 6-10 0 - 5   Mucus PRESENT     Comment: Performed at [facility], [address] [place], [place] [address]  Urine Culture     Status: None   Collection Time: 06/28/19  9:27 AM   Specimen: Urine, Random  Result Value Ref Range   Specimen Description      URINE, RANDOM Performed at [facility], [address], [place], [place] [address]    Special Requests      NONE Performed at [facility], [address] [place], [place] [address]    Culture      NO GROWTH Performed at [facility], [place] [address] [place], [place] [address]    Report  Status 06/29/2019 FINAL   Comprehensive metabolic panel     Status: Abnormal   Collection Time: 06/28/19  9:41 AM  Result Value Ref Range   Sodium 139 135 - 145 mmol/L   Potassium 3.8 3.5 - 5.1 mmol/L   Chloride 102 98 - 111 mmol/L   CO2 26 22 - 32 mmol/L   Glucose, Bld 111 (H) 70 - 99 mg/dL    Comment: Glucose reference range applies only to samples taken after fasting for at least 8 hours.   BUN 25 (H) 8 - 23 mg/dL   Creatinine, Ser 1.11 (H) 0.44 - 1.00 mg/dL   Calcium 9.9 8.9 - 10.3 mg/dL   Total Protein 8.0 6.5 - 8.1 g/dL   Albumin 4.3 3.5 - 5.0 g/dL   AST 26 15 - 41 U/L   ALT 18 0 - 44 U/L   Alkaline Phosphatase 75 38 - 126 U/L   Total Bilirubin 0.5 0.3 - 1.2 mg/dL   GFR calc non Af Amer 47 (L) >60 mL/min   GFR calc Af Amer 54 (L) >60 mL/min   Anion gap 11 5 - 15    Comment: Performed at [facility], [address], [place], [place] [address]  Lipase, blood     Status: None   Collection Time: 06/28/19  9:41 AM  Result Value Ref Range   Lipase 28 11 - 51 U/L    Comment: Performed at [facility], [address] [place], [place] [address]  CBC with Diff     Status: Abnormal   Collection Time: 06/28/19  9:41 AM  Result Value Ref Range   WBC 7.9 4.0 - 10.5 K/uL   RBC 4.58 3.87 - 5.11 MIL/uL   Hemoglobin 12.2 12.0 - 15.0 g/dL   HCT 37.6 36 - 46 %   MCV 82.1 80.0 - 100.0 fL   MCH 26.6 26.0 - 34.0 pg   MCHC 32.4 30.0 - 36.0 g/dL   RDW 16.3 (H) 11.5 - 15.5 %   Platelets 166 150 - 400 K/uL   nRBC  0.0 0.0 - 0.2 %   Neutrophils Relative % 76 %   Neutro Abs 6.1 1.7 - 7.7 K/uL   Lymphocytes Relative 13 %   Lymphs Abs 1.1 0.7 - 4.0 K/uL   Monocytes Relative 7 %   Monocytes Absolute 0.5 0 - 1 K/uL   Eosinophils Relative 2 %   Eosinophils Absolute 0.1 0 - 0 K/uL   Basophils Relative 1 %   Basophils Absolute 0.1 0 - 0 K/uL   Immature Granulocytes 1 %   Abs Immature Granulocytes 0.05 0.00 - 0.07 K/uL    Comment: Performed at [facility], [address], [place], [place]  [address]  TSH     Status: Abnormal   Collection Time: 08/16/19 10:24 AM  Result Value Ref Range   TSH 17.27 (H) 0.40 - 4.50 mIU/L  T4, free     Status: Abnormal   Collection Time: 08/16/19 10:24 AM  Result Value Ref Range   Free T4 0.7 (L) 0.8 - 1.8 ng/dL      Diabetic Labs (most recent): Lab Results  Component Value Date   HGBA1C 5.8 (H) 05/18/2019   HGBA1C 6.1 (H) 08/31/2017   HGBA1C 5.6 04/08/2016       Assessment & Plan:   1. Hypothyroidism  Her previsit thyroid panel indicates slight under replacement of thyroid hormone.  Have discussed and initiated increase in her levothyroxine dose to 112 mcg p.o. daily before breakfast.  New prescription sent to pharmacy.  Will recheck thyroid profile before next visit in 4 months.   - We discussed about correct intake of levothyroxine, at fasting, with water, separated by at least 30 minutes from breakfast, and separated by more than 4 hours from calcium, iron, multivitamins, acid reflux medications (PPIs). -Patient is made aware of the fact that thyroid hormone replacement is needed for life, dose to be adjusted by periodic monitoring of thyroid function tests.    2. HTN  Her blood pressure today is 191/71, above target.  She reports she did not take her blood pressure medication today due to urinary frequency interrupting her doctor's appointment.  She states she will take her medication as soon as she gets home.  Follow up plan: Return in about 4 months (around 12/23/2019) for Thyroid follow up, Previsit labs.  Rayetta Pigg, FNP-BC [facility] Phone: [phone] Fax: [phone]   08/23/2019, 3:04 PM

## 2019-09-08 ENCOUNTER — Other Ambulatory Visit: Payer: Self-pay | Admitting: Family Medicine

## 2019-09-10 ENCOUNTER — Other Ambulatory Visit: Payer: Self-pay | Admitting: Family Medicine

## 2019-10-18 ENCOUNTER — Other Ambulatory Visit: Payer: Self-pay | Admitting: Family Medicine

## 2019-10-20 ENCOUNTER — Other Ambulatory Visit: Payer: Medicare PPO

## 2019-10-27 ENCOUNTER — Ambulatory Visit (INDEPENDENT_AMBULATORY_CARE_PROVIDER_SITE_OTHER): Payer: Medicare PPO

## 2019-10-27 DIAGNOSIS — I25119 Atherosclerotic heart disease of native coronary artery with unspecified angina pectoris: Secondary | ICD-10-CM

## 2019-10-27 LAB — ECHOCARDIOGRAM COMPLETE
Area-P 1/2: 2.63 cm2
Calc EF: 68.8 %
MV M vel: 3.34 m/s
MV Peak grad: 44.5 mmHg
S' Lateral: 2.35 cm
Single Plane A2C EF: 64.5 %
Single Plane A4C EF: 71.4 %

## 2019-10-28 ENCOUNTER — Telehealth: Payer: Self-pay | Admitting: *Deleted

## 2019-10-28 ENCOUNTER — Ambulatory Visit: Payer: Medicare PPO | Admitting: Family Medicine

## 2019-10-28 ENCOUNTER — Ambulatory Visit: Payer: Medicare PPO | Admitting: Cardiology

## 2019-10-28 NOTE — Telephone Encounter (Signed)
Patient informed. Copy sent to PCP °

## 2019-10-28 NOTE — Telephone Encounter (Signed)
-----   Message from Satira Sark, MD sent at 10/28/2019  8:22 AM EDT ----- Results reviewed.  Cardiac function remains normal with LVEF 60 to 65%.  Mild diastolic dysfunction noted with plan to continue medical therapy.  Keep follow-up as scheduled.

## 2019-11-04 ENCOUNTER — Ambulatory Visit (INDEPENDENT_AMBULATORY_CARE_PROVIDER_SITE_OTHER): Payer: Medicare PPO | Admitting: Family Medicine

## 2019-11-04 ENCOUNTER — Other Ambulatory Visit: Payer: Self-pay

## 2019-11-04 VITALS — BP 150/82 | HR 60 | Temp 97.3°F | Resp 18 | Wt 227.2 lb

## 2019-11-04 DIAGNOSIS — R7303 Prediabetes: Secondary | ICD-10-CM

## 2019-11-04 DIAGNOSIS — Z23 Encounter for immunization: Secondary | ICD-10-CM | POA: Diagnosis not present

## 2019-11-04 DIAGNOSIS — I2510 Atherosclerotic heart disease of native coronary artery without angina pectoris: Secondary | ICD-10-CM

## 2019-11-04 DIAGNOSIS — I10 Essential (primary) hypertension: Secondary | ICD-10-CM

## 2019-11-04 DIAGNOSIS — R609 Edema, unspecified: Secondary | ICD-10-CM

## 2019-11-04 DIAGNOSIS — M1611 Unilateral primary osteoarthritis, right hip: Secondary | ICD-10-CM

## 2019-11-04 DIAGNOSIS — E039 Hypothyroidism, unspecified: Secondary | ICD-10-CM | POA: Diagnosis not present

## 2019-11-04 DIAGNOSIS — M1711 Unilateral primary osteoarthritis, right knee: Secondary | ICD-10-CM | POA: Diagnosis not present

## 2019-11-04 MED ORDER — FUROSEMIDE 20 MG PO TABS: ORAL_TABLET | ORAL | 0 refills | Status: DC

## 2019-11-04 NOTE — Patient Instructions (Addendum)
Referral to orthopedics for your leg - [place]  Use the volatren gel and tylenol  Take lasix 20mg  once a day for 3 days  We will call on MOnday  Flu shot given  F/U 3 months

## 2019-11-04 NOTE — Progress Notes (Signed)
   Subjective:    Patient ID: Robin [name], female    DOB: [birth year], 79 y.o.   MRN: [id number]  Patient presents for Leg Swelling   Pt here to discuss increased leg swelling, and pain  had ECHO done on last week  Blood pressure  Has beenup a little with the swelling  no change in diet or meds     OA with worsening pain down right leg -  The ultram makes her feel sick on the stomach so she stopped taking, she has been taking tylenol/ ibuprofen She would like to go back to ortho, had injections in the past which helped some No recent falls    Hypothyridism now on 116mcg once a day from endocrinology     Review Of Systems:  GEN- denies fatigue, fever, weight loss,weakness, recent illness HEENT- denies eye drainage, change in vision, nasal discharge, CVS- denies chest pain, palpitations RESP- denies SOB, cough, wheeze ABD- denies N/V, change in stools, abd pain GU- denies dysuria, hematuria, dribbling, incontinence MSK- +oint pain, muscle aches, injury Neuro- denies headache, dizziness, syncope, seizure activity       Objective:    BP (!) 150/82   Pulse 60   Temp (!) 97.3 F (36.3 C)   Resp 18   Wt 227 lb 3.2 oz (103.1 kg)   SpO2 98%   BMI 36.67 kg/m     GEN- NAD, alert and oriented x3,weight up 4lbs since August  HEENT- PERRL, EOMI, non injected sclera, pink conjunctiva, MMM, oropharynx clear Neck- Supple, no thryomegaly,no bruit  CVS- RRR, 2/6 SEM  , HR 60's RESP-CTAB ABD-NABS,SOFT,NT,ND MSK - Decreaed ROM Spine, HIPS, KNEES, no apparent effusion EXT moderate pedal edema bilat to mid shin , chronic lymphedema LUE Pulses- Radial, DP- 2+       Assessment & Plan:      Problem List Items Addressed This Visit      Unprioritized   Coronary atherosclerosis of native coronary artery - Primary    Continue current meds      Relevant Medications   furosemide (LASIX) 20 MG tablet   Essential hypertension, benign    BP elevated In setting of peripheral  edema Add lasix 20mg  daily for 3days Keep other meds, she does have low dose HCTZ in on of her meds Keep cardiology appt for next wek No respiratory symptoms       Relevant Medications   furosemide (LASIX) 20 MG tablet   Other Relevant Orders   CBC with Differential/Platelet (Completed)   Comprehensive metabolic panel (Completed)   Hypothyroidism    Recently adjusted from endocrinology Continue replacement       OA (osteoarthritis) of hip   Peripheral edema    Lasix and compression hose       Prediabetes   Relevant Orders   Hemoglobin A1c (Completed)    Other Visit Diagnoses    Primary osteoarthritis of right knee       Known DDD and OA, referral to ortho for injections, D/C NSAIDS, use tylenol, topical voltaren   Need for immunization against influenza       Relevant Orders   Flu Vaccine QUAD High Dose(Fluad) (Completed)      Note: This dictation was prepared with Dragon dictation along with smaller phrase technology. Any transcriptional errors that result from this process are unintentional.

## 2019-11-05 LAB — CBC WITH DIFFERENTIAL/PLATELET
Absolute Monocytes: 550 cells/uL (ref 200–950)
Basophils Absolute: 19 cells/uL (ref 0–200)
Basophils Relative: 0.3 %
Eosinophils Absolute: 230 cells/uL (ref 15–500)
Eosinophils Relative: 3.6 %
HCT: 37.4 % (ref 35.0–45.0)
Hemoglobin: 12.1 g/dL (ref 11.7–15.5)
Lymphs Abs: 1152 cells/uL (ref 850–3900)
MCH: 26.2 pg — ABNORMAL LOW (ref 27.0–33.0)
MCHC: 32.4 g/dL (ref 32.0–36.0)
MCV: 81 fL (ref 80.0–100.0)
MPV: 10.4 fL (ref 7.5–12.5)
Monocytes Relative: 8.6 %
Neutro Abs: 4448 cells/uL (ref 1500–7800)
Neutrophils Relative %: 69.5 %
Platelets: 161 10*3/uL (ref 140–400)
RBC: 4.62 10*6/uL (ref 3.80–5.10)
RDW: 14.7 % (ref 11.0–15.0)
Total Lymphocyte: 18 %
WBC: 6.4 10*3/uL (ref 3.8–10.8)

## 2019-11-05 LAB — COMPREHENSIVE METABOLIC PANEL
AG Ratio: 1.4 (calc) (ref 1.0–2.5)
ALT: 10 U/L (ref 6–29)
AST: 19 U/L (ref 10–35)
Albumin: 4.2 g/dL (ref 3.6–5.1)
Alkaline phosphatase (APISO): 84 U/L (ref 37–153)
BUN/Creatinine Ratio: 19 (calc) (ref 6–22)
BUN: 22 mg/dL (ref 7–25)
CO2: 26 mmol/L (ref 20–32)
Calcium: 10.1 mg/dL (ref 8.6–10.4)
Chloride: 105 mmol/L (ref 98–110)
Creat: 1.18 mg/dL — ABNORMAL HIGH (ref 0.60–0.88)
Globulin: 2.9 g/dL (calc) (ref 1.9–3.7)
Glucose, Bld: 100 mg/dL — ABNORMAL HIGH (ref 65–99)
Potassium: 3.8 mmol/L (ref 3.5–5.3)
Sodium: 141 mmol/L (ref 135–146)
Total Bilirubin: 0.7 mg/dL (ref 0.2–1.2)
Total Protein: 7.1 g/dL (ref 6.1–8.1)

## 2019-11-05 LAB — HEMOGLOBIN A1C
Hgb A1c MFr Bld: 5.8 % of total Hgb — ABNORMAL HIGH (ref <5.7)
Mean Plasma Glucose: 120 (calc)
eAG (mmol/L): 6.6 (calc)

## 2019-11-06 ENCOUNTER — Encounter: Payer: Self-pay | Admitting: Family Medicine

## 2019-11-06 NOTE — Assessment & Plan Note (Signed)
Lasix and compression hose

## 2019-11-06 NOTE — Assessment & Plan Note (Signed)
Continue current meds 

## 2019-11-06 NOTE — Assessment & Plan Note (Signed)
BP elevated In setting of peripheral edema Add lasix 20mg  daily for 3days Keep other meds, she does have low dose HCTZ in on of her meds Keep cardiology appt for next wek No respiratory symptoms

## 2019-11-06 NOTE — Assessment & Plan Note (Signed)
Recently adjusted from endocrinology Continue replacement

## 2019-11-10 NOTE — Progress Notes (Addendum)
Cardiology Office Note  Date: 11/11/2019   ID: Robin, [name] July 23, 1940, MRN [id number]  PCP:  Alycia Rossetti, MD  Cardiologist:  Rozann Lesches, MD Electrophysiologist:  None   Chief Complaint: History of cardiomyopathy  History of Present Illness: Robin [name] is a 79 y.o. female with a history of cardiomyopathy, CAD, carotid artery disease, HTN, HLD, hypothyroidism, hepatic steatosis, breast CA, CVA, DM   Last encounter with [name] via telemedicine 04/25/2019. She did not report any anginal symptoms or nitroglycerin use. Stable NYHA class II dyspnea with basic ADLs. Follow-up echocardiogram was pending. She preferred to hold off on getting this done. Planned to repeat study prior to next office visit. Medications were reviewed. Aspirin, Norvasc, Coreg, Plavix, Hyzaar, Crestor. Nitroglycerin sublingual available. Echo was ordered prior to next 75-monthvisit. Systolic blood pressure was in the 140s. She was to continue Norvasc, Coreg, and Hyzaar. CAD status post DES to second diagonal 2012. Continue observation and medical therapy.   Patient is here for 644-monthollow-up and review of repeat echo.  She denies any recent anginal or exertional symptoms/shortness of breath/DOE, no orthostatic symptoms, lightheadedness, dizziness, presyncopal or syncopal episodes.  No CVA or TIA-like symptoms, palpitations or arrhythmias, PND, orthopnea, bleeding, claudication-like symptoms, DVT or PE-like symptoms, or lower extremity edema.  Blood pressure is elevated on arrival.  Patient states her systolic blood pressures have been running in the 140s consistently.   Past Medical History:  Diagnosis Date  . Breast cancer ([facility]   Tamoxifen started 2007  . CAD (coronary artery disease)    DES second diagonal 2/12  . Cardiomyopathy    Probable Takotsubo, LVEF 30-35% 2/12  . Carotid artery disease ([facility]  . Essential hypertension   . Hepatic steatosis   . Hyperlipidemia   .  Hypothyroidism   . [place]I (non-ST elevated [place] infarction) ([facility]   2/12  . Pancreas divisum   . Stroke ([facility]   (2000) residual left-sided weakness  . Type 2 diabetes mellitus ([facility]    Past Surgical History:  Procedure Laterality Date  . ABDOMINAL HYSTERECTOMY    . CAROTID ENDARTERECTOMY     Left  . CHOLECYSTECTOMY    . JOINT REPLACEMENT     Right knee replacement- Dr. KrSebastian AcheA  . MASTECTOMY     Bilateral    Current Outpatient Medications  Medication Sig Dispense Refill  . amLODipine (NORVASC) 10 MG tablet TAKE 1 TABLET BY MOUTH ONCE DAILY 90 tablet 2  . aspirin 81 MG tablet Take 81 mg by mouth daily.      . carvedilol (COREG) 12.5 MG tablet TAKE 1.5 TABLETS BY MOUTH TWICE DAILY 90 tablet 1  . Cholecalciferol (VITAMIN D3) 125 MCG (5000 UT) CAPS TAKE 1 CAPSULE BY MOUTH ONCE DAILY 90 capsule 1  . clopidogrel (PLAVIX) 75 MG tablet TAKE 1 TABLET BY MOUTH ONCE DAILY 90 tablet 0  . docusate sodium (COLACE) 100 MG capsule Take 100 mg by mouth as needed for mild constipation.    . furosemide (LASIX) 20 MG tablet Take 1 tablet daily prn 30 tablet 0  . ibuprofen (ADVIL,MOTRIN) 600 MG tablet Take 600 mg by mouth every 6 (six) hours as needed for fever or headache.     . levothyroxine (SYNTHROID) 112 MCG tablet TAKE 1 TABLET BY MOUTH EVERY DAY BEFORE BREAKFAST 90 tablet 2  . losartan-hydrochlorothiazide (HYZAAR) 50-12.5 MG tablet TAKE 1 TABLET BY MOUTH EVERY DAY 30 tablet 3  .  nitroGLYCERIN (NITROSTAT) 0.4 MG SL tablet Place 1 tablet (0.4 mg total) under the tongue every 5 (five) minutes x 3 doses as needed. 25 tablet 3  . Omega-3 Fatty Acids 300 MG CAPS Take 1 capsule (300 mg total) by mouth 2 (two) times daily.    . pantoprazole (PROTONIX) 40 MG tablet     . polyethylene glycol powder (GLYCOLAX/MIRALAX) 17 GM/SCOOP powder SMARTSIG:2 Tablespoon By Mouth Every Night    . rosuvastatin (CRESTOR) 40 MG tablet TAKE 1 TABLET BY MOUTH ONCE DAILY 90 tablet 2   No current  facility-administered medications for this visit.   Allergies:  Codeine   Social History: The patient  reports that she has never smoked. She has never used smokeless tobacco. She reports that she does not drink alcohol and does not use drugs.   Family History: The patient's family history includes Heart attack in her brother and sister; Heart disease in her brother and sister.   ROS:  Please see the history of present illness. Otherwise, complete review of systems is positive for none.  All other systems are reviewed and negative.   Physical Exam: VS:  BP (!) 160/100   Pulse 62   Ht _0  (1.676 m)   Wt 224 lb (101.6 kg)   SpO2 97%   BMI 36.15 kg/m , BMI Body mass index is 36.15 kg/m.  Wt Readings from Last 3 Encounters:  11/11/19 224 lb (101.6 kg)  11/04/19 227 lb 3.2 oz (103.1 kg)  08/23/19 223 lb 12.8 oz (101.5 kg)    General: Patient appears comfortable at rest. Neck: Supple, no elevated JVP, mild carotid bruits bilaterally.  Evidence of previous CEA left side, no thyromegaly. Lungs: Clear to auscultation, nonlabored breathing at rest. Cardiac: Regular rate and rhythm, no S3 or significant systolic murmur, no pericardial rub. Extremities: No pitting edema, distal pulses 2+. Skin: Warm and dry. Musculoskeletal: No kyphosis. Neuropsychiatric: Alert and oriented x3, affect grossly appropriate.  ECG:  EKG June 28, 2019 sinus rhythm rate of 62, borderline T abnormalities, inferior leads, prolonged QT with QT C/QTc 541/550 ms  Recent Labwork: 08/16/2019: TSH 17.27 11/04/2019: ALT 10; AST 19; BUN 22; Creat 1.18; Hemoglobin 12.1; Platelets 161; Potassium 3.8; Sodium 141     Component Value Date/Time   CHOL 158 05/18/2019 0959   TRIG 107 05/18/2019 0959   HDL 47 (L) 05/18/2019 0959   CHOLHDL 3.4 05/18/2019 0959   VLDL 20 04/08/2016 0910   LDLCALC 91 05/18/2019 0959    Other Studies Reviewed Today:  Echocardiogram 10/27/2019  1. Left ventricular ejection fraction, by  estimation, is 60 to 65%. The left ventricle has normal function. The left ventricle has no regional wall motion abnormalities. There is mild left ventricular hypertrophy. Left ventricular diastolic parameters are consistent with Grade I diastolic dysfunction (impaired relaxation). Elevated left atrial pressure. 2. Right ventricular systolic function is normal. The right ventricular size is normal. 3. Left atrial size was mildly dilated. 4. The mitral valve is normal in structure. Trivial mitral valve regurgitation. No evidence of mitral stenosis. 5. The aortic valve is tricuspid. There is mild calcification of the aortic valve. There is mild thickening of the aortic valve. Aortic valve regurgitation is not visualized. No aortic stenosis is present. 6. The inferior vena cava is normal in size with greater than 50% respiratory variability, suggesting right atrial pressure of 3 mmHg. Comparison(s): Echocardiogram done 04/27/15 showed an EF of 60-65%.   Echocardiogram 04/27/2015: Study Conclusions  - Left ventricle: The cavity  size was normal. Wall thickness was increased in a pattern of severe LVH. Systolic function was normal. The estimated ejection fraction was in the range of 60% to 65%. Wall motion was normal; there were no regional wall motion abnormalities. Doppler parameters are consistent with abnormal left ventricular relaxation (grade 1 diastolic dysfunction). Doppler parameters are consistent with high ventricular filling pressure. - Aortic valve: Mildly calcified annulus. Trileaflet; mildly calcified leaflets. - Mitral valve: Calcified annulus. There was trivial regurgitation. - Left atrium: The atrium was mildly dilated. - Right atrium: Central venous pressure (est): 3 mm Hg. - Tricuspid valve: There was trivial regurgitation. - Pulmonary arteries: PA peak pressure: 26 mm Hg (S). - Pericardium, extracardiac: There was no pericardial  effusion.  Impressions:  - Severe LVH with LVEF 60-65%. Grade 1 diastolic dysfunction with increased LV filling pressures. Mild left atrial enlargement. MAC with trivial mitral regurgitation. Mildly sclerotic aortic valve. Trivial tricuspid regurgitation with PASP 26 mmHg mmHg.  Assessment and Plan:  1. Secondary cardiomyopathy ([place])   2. Essential hypertension, benign   3. CAD in native artery    1. Secondary cardiomyopathy ([place]) Echocardiogram 10/27/2019: EF 60 to 65%, mild LVH.  This has improved since previous echo in 2017 which showed severe LVH.  G1 DD unchanged from last echo in 2017.  Trivial mitral regurgitation unchanged from previous echo 2017.  2. Essential hypertension, benign Blood pressure is elevated today on arrival at 160/100.  Patient has taken her medications today.  She states her systolic blood pressure runs in the 140s consistently at home.  Add hydralazine 25 mg p.o. twice daily to current regimen. Continue Coreg 12.5 mg p.o. twice daily, losartan/hydrochlorothiazide 50/12.5 mg daily.  Continue Lasix 20 mg as needed.  Continue amlodipine 10 mg daily.  3. CAD in native artery No anginal or exertional symptoms or use of nitroglycerin.  Continue aspirin 81 mg daily.  Continue nitroglycerin 0.4 mg sublingual as needed.  4.  Carotid artery disease. Patient has bilateral carotid artery bruits. Previous carotid artery study 2018 heterogeneous plaque with shadowing bilaterally.  1 to 39% ICA stenosis bilaterally.  Greater than 50% stenosis of the right ECA.  Patent left endarterectomy site.  Normal subclavian arteries, bilaterally.  Patent vertebral arteries with antegrade flow.  We will need to order repeat study on next follow-up.  She has no neurological symptoms.  No CVA or TIA-like symptoms.  Medication Adjustments/Labs and Tests Ordered: Current medicines are reviewed at length with the patient today.  Concerns regarding medicines are outlined above.    Disposition: Follow-up with [name] or APP 6 months  Signed, Levell July, NP 11/11/2019 12:32 PM    [facility] [place], [place], [place] [address] Phone: [phone]; Fax: [phone]

## 2019-11-11 ENCOUNTER — Encounter: Payer: Self-pay | Admitting: Family Medicine

## 2019-11-11 ENCOUNTER — Ambulatory Visit (INDEPENDENT_AMBULATORY_CARE_PROVIDER_SITE_OTHER): Payer: Medicare PPO | Admitting: Family Medicine

## 2019-11-11 VITALS — BP 160/100 | HR 62 | Ht 66.0 in | Wt 224.0 lb

## 2019-11-11 DIAGNOSIS — I429 Cardiomyopathy, unspecified: Secondary | ICD-10-CM

## 2019-11-11 DIAGNOSIS — I10 Essential (primary) hypertension: Secondary | ICD-10-CM | POA: Diagnosis not present

## 2019-11-11 DIAGNOSIS — I2510 Atherosclerotic heart disease of native coronary artery without angina pectoris: Secondary | ICD-10-CM

## 2019-11-11 MED ORDER — HYDRALAZINE HCL 25 MG PO TABS
25.0000 mg | ORAL_TABLET | Freq: Two times a day (BID) | ORAL | 1 refills | Status: DC
Start: 2019-11-11 — End: 2020-04-06

## 2019-11-11 NOTE — Patient Instructions (Addendum)
Your physician wants you to follow-up in: [place] will receive a reminder letter in the mail two months in advance. If you don't receive a letter, please call our office to schedule the follow-up appointment.  Your physician has recommended you make the following change in your medication:   START HYDRALAZINE 25 MG TWICE DAILY   Thank you for choosing [facility]!!

## 2019-11-24 ENCOUNTER — Ambulatory Visit (INDEPENDENT_AMBULATORY_CARE_PROVIDER_SITE_OTHER): Payer: Medicare PPO | Admitting: Orthopedic Surgery

## 2019-11-24 ENCOUNTER — Encounter: Payer: Self-pay | Admitting: Orthopedic Surgery

## 2019-11-24 ENCOUNTER — Ambulatory Visit: Payer: Medicare PPO

## 2019-11-24 ENCOUNTER — Other Ambulatory Visit: Payer: Self-pay

## 2019-11-24 VITALS — BP 237/102 | HR 74 | Ht 66.0 in | Wt 224.0 lb

## 2019-11-24 DIAGNOSIS — M542 Cervicalgia: Secondary | ICD-10-CM | POA: Insufficient documentation

## 2019-11-24 DIAGNOSIS — R079 Chest pain, unspecified: Secondary | ICD-10-CM | POA: Insufficient documentation

## 2019-11-24 DIAGNOSIS — S62609A Fracture of unspecified phalanx of unspecified finger, initial encounter for closed fracture: Secondary | ICD-10-CM | POA: Insufficient documentation

## 2019-11-24 DIAGNOSIS — M545 Low back pain, unspecified: Secondary | ICD-10-CM

## 2019-11-24 DIAGNOSIS — M47816 Spondylosis without myelopathy or radiculopathy, lumbar region: Secondary | ICD-10-CM

## 2019-11-24 DIAGNOSIS — M4316 Spondylolisthesis, lumbar region: Secondary | ICD-10-CM | POA: Diagnosis not present

## 2019-11-24 DIAGNOSIS — M25559 Pain in unspecified hip: Secondary | ICD-10-CM | POA: Insufficient documentation

## 2019-11-24 DIAGNOSIS — IMO0001 Reserved for inherently not codable concepts without codable children: Secondary | ICD-10-CM | POA: Insufficient documentation

## 2019-11-24 DIAGNOSIS — M1611 Unilateral primary osteoarthritis, right hip: Secondary | ICD-10-CM

## 2019-11-24 DIAGNOSIS — M25551 Pain in right hip: Secondary | ICD-10-CM

## 2019-11-24 DIAGNOSIS — R32 Unspecified urinary incontinence: Secondary | ICD-10-CM | POA: Insufficient documentation

## 2019-11-24 MED ORDER — METHYLPREDNISOLONE ACETATE 40 MG/ML IJ SUSP
40.0000 mg | Freq: Once | INTRAMUSCULAR | Status: AC
Start: 2019-11-24 — End: 2019-11-24
  Administered 2019-11-24: 40 mg via INTRAMUSCULAR

## 2019-11-24 NOTE — Patient Instructions (Signed)
Heat as needed   Tylenol 500 4 x a day   You have received an injection of steroids

## 2019-11-24 NOTE — Progress Notes (Signed)
Chief Complaint  Patient presents with  . Back Pain    right buttock pain and low back pain   . Hip Pain    right groin pain    79 years old presents for evaluation of back and groin pain.  Patient says she gets back and groin pain every winter goes to the ER for a cortisone shot and does well until next winter  I checked her records and she does indeed go to the ER for cortisone shot.  She got 1 last year  She complains of pain in the groin bilaterally and lower back with some numbness in her feet  She does have a history of cancer  She denies any other red flags  Past Medical History:  Diagnosis Date  . Breast cancer ([place])    Tamoxifen started 2007  . CAD (coronary artery disease)    DES second diagonal 2/12  . Cardiomyopathy    Probable Takotsubo, LVEF 30-35% 2/12  . Carotid artery disease ([place])   . Essential hypertension   . Hepatic steatosis   . Hyperlipidemia   . Hypothyroidism   . NSTEMI (non-ST elevated myocardial infarction) ([place])    2/12  . Pancreas divisum   . Stroke ([place])    (2000) residual left-sided weakness  . Type 2 diabetes mellitus ([place])    Past Surgical History:  Procedure Laterality Date  . ABDOMINAL HYSTERECTOMY    . CAROTID ENDARTERECTOMY     Left  . CHOLECYSTECTOMY    . JOINT REPLACEMENT     Right [facility]- [facility]. [facility]  . MASTECTOMY     Bilateral    BP (!) 237/102   Pulse 74   Ht 5\' 6"  (1.676 m)   Wt 224 lb (101.6 kg)   BMI 36.15 kg/m   General appearance patient is somewhat overweight otherwise development is normal nutrition is normal and grooming is normal  Cardiovascular mild edema bilaterally  Gait is notable for no significant disease  The right hip and left hip have minimal symptoms with internal rotation leg lengths are equal  Lumbar spine is tender no swelling skin is normal  Sensation is normal to soft touch she is oriented x3 mood and affect are normal  X-rays of the pelvis right hip spine shows  some spondylosis and spondylolisthesis as well as mild arthritis in both hips  I will go ahead and proceed with the injection IM 40 mg by the nurse  Follow-up if no improvement

## 2019-11-26 ENCOUNTER — Other Ambulatory Visit: Payer: Self-pay | Admitting: Family Medicine

## 2019-11-28 NOTE — Telephone Encounter (Signed)
Pt was last seen 11/04/19 and in the message below was stated in Assessment & Plan::  Essential hypertension, benign     BP elevated In setting of peripheral edema Add lasix 20mg  daily for 3days Keep other meds, she does have low dose HCTZ in on of her meds     Please advise if the refill of Lasix is still appropriate.

## 2019-12-22 ENCOUNTER — Other Ambulatory Visit: Payer: Self-pay | Admitting: "Endocrinology

## 2019-12-22 DIAGNOSIS — E559 Vitamin D deficiency, unspecified: Secondary | ICD-10-CM | POA: Diagnosis not present

## 2019-12-22 DIAGNOSIS — E039 Hypothyroidism, unspecified: Secondary | ICD-10-CM | POA: Diagnosis not present

## 2019-12-23 LAB — VITAMIN D 25 HYDROXY (VIT D DEFICIENCY, FRACTURES): Vit D, 25-Hydroxy: 22.9 ng/mL — ABNORMAL LOW (ref 30.0–100.0)

## 2019-12-23 LAB — T4, FREE: Free T4: 0.73 ng/dL — ABNORMAL LOW (ref 0.82–1.77)

## 2019-12-23 LAB — TSH: TSH: 16 u[IU]/mL — ABNORMAL HIGH (ref 0.450–4.500)

## 2019-12-26 ENCOUNTER — Ambulatory Visit: Payer: Medicare PPO | Admitting: Nurse Practitioner

## 2020-01-05 ENCOUNTER — Encounter: Payer: Self-pay | Admitting: Nurse Practitioner

## 2020-01-05 ENCOUNTER — Other Ambulatory Visit: Payer: Self-pay

## 2020-01-05 ENCOUNTER — Ambulatory Visit: Payer: Medicare PPO | Admitting: Nurse Practitioner

## 2020-01-05 VITALS — BP 209/93 | HR 63 | Ht 66.0 in | Wt 224.0 lb

## 2020-01-05 DIAGNOSIS — E559 Vitamin D deficiency, unspecified: Secondary | ICD-10-CM

## 2020-01-05 DIAGNOSIS — E039 Hypothyroidism, unspecified: Secondary | ICD-10-CM

## 2020-01-05 MED ORDER — LEVOTHYROXINE SODIUM 125 MCG PO TABS: 125.0000 ug | ORAL_TABLET | Freq: Every day | ORAL | 3 refills | Status: DC

## 2020-01-05 NOTE — Progress Notes (Signed)
Endocrinology follow-up note   Subjective:    Patient ID: Robin [name], female    DOB: [birth year], PCP Alycia Rossetti, MD Patient presents today for follow-up of hypothyroidism.  Has been taking levothyroxine 100 mcg p.o. daily before breakfast.  Past Medical History:  Diagnosis Date  . Breast cancer ([place])    Tamoxifen started 2007  . CAD (coronary artery disease)    DES second diagonal 2/12  . Cardiomyopathy    Probable Takotsubo, LVEF 30-35% 2/12  . Carotid artery disease ([place])   . Essential hypertension   . Hepatic steatosis   . Hyperlipidemia   . Hypothyroidism   . NSTEMI (non-ST elevated myocardial infarction) ([place])    2/12  . Pancreas divisum   . Stroke ([place])    (2000) residual left-sided weakness  . Type 2 diabetes mellitus ([place])    Past Surgical History:  Procedure Laterality Date  . ABDOMINAL HYSTERECTOMY    . CAROTID ENDARTERECTOMY     Left  . CHOLECYSTECTOMY    . JOINT REPLACEMENT     Right knee replacement- [facility]. [facility]  . MASTECTOMY     Bilateral   Social History   Socioeconomic History  . Marital status: Widowed    Spouse name: Not on file  . Number of children: Not on file  . Years of education: Not on file  . Highest education level: Not on file  Occupational History  . Not on file  Tobacco Use  . Smoking status: Never Smoker  . Smokeless tobacco: Never Used  Vaping Use  . Vaping Use: Never used  Substance and Sexual Activity  . Alcohol use: No    Alcohol/week: 0.0 standard drinks  . Drug use: No  . Sexual activity: Not on file  Other Topics Concern  . Not on file  Social History Narrative  . Not on file   Social Determinants of Health   Financial Resource Strain: Not on file  Food Insecurity: Not on file  Transportation Needs: Not on file  Physical Activity: Not on file  Stress: Not on file  Social Connections: Not on file   Outpatient Encounter Medications as of 01/05/2020  Medication Sig  . amLODipine  (NORVASC) 10 MG tablet TAKE 1 TABLET BY MOUTH ONCE DAILY  . aspirin 81 MG tablet Take 81 mg by mouth daily.  . carvedilol (COREG) 12.5 MG tablet TAKE 1.5 TABLETS BY MOUTH TWICE DAILY  . Cholecalciferol (VITAMIN D3) 125 MCG (5000 UT) CAPS TAKE 1 CAPSULE BY MOUTH ONCE DAILY  . clopidogrel (PLAVIX) 75 MG tablet TAKE 1 TABLET BY MOUTH ONCE DAILY  . docusate sodium (COLACE) 100 MG capsule Take 100 mg by mouth as needed for mild constipation.  . furosemide (LASIX) 20 MG tablet Take 1 tablet daily prn  . hydrALAZINE (APRESOLINE) 25 MG tablet Take 1 tablet (25 mg total) by mouth in the morning and at bedtime.  Marland Kitchen ibuprofen (ADVIL,MOTRIN) 600 MG tablet Take 600 mg by mouth every 6 (six) hours as needed for fever or headache.   . levothyroxine (SYNTHROID) 112 MCG tablet TAKE 1 TABLET BY MOUTH EVERY DAY BEFORE BREAKFAST  . losartan-hydrochlorothiazide (HYZAAR) 50-12.5 MG tablet TAKE 1 TABLET BY MOUTH EVERY DAY  . nitroGLYCERIN (NITROSTAT) 0.4 MG SL tablet Place 1 tablet (0.4 mg total) under the tongue every 5 (five) minutes x 3 doses as needed.  . Omega-3 Fatty Acids 300 MG CAPS Take 1 capsule (300 mg total) by mouth 2 (two) times daily.  Marland Kitchen  pantoprazole (PROTONIX) 40 MG tablet   . rosuvastatin (CRESTOR) 40 MG tablet TAKE 1 TABLET BY MOUTH ONCE DAILY   No facility-administered encounter medications on file as of 01/05/2020.   ALLERGIES: Allergies  Allergen Reactions  . Codeine Nausea And Vomiting    VACCINATION STATUS: Immunization History  Administered Date(s) Administered  . Fluad Quad(high Dose 65+) 10/12/2018, 11/04/2019  . Influenza, High Dose Seasonal PF 11/17/2016, 12/28/2017  . Influenza,inj,Quad PF,6+ Mos 10/31/2014, 11/20/2015  . Moderna Sars-Covid-2 Vaccination 06/10/2019, 07/08/2019  . Pneumococcal Conjugate-13 12/01/2012    Thyroid Problem Presents for follow-up visit. Patient reports no anxiety, cold intolerance, constipation, depressed mood, diarrhea, fatigue, heat intolerance,  palpitations, tremors, weight gain or weight loss. The symptoms have been stable.   - 79-yr old female patient with medical history as above. She is being seen in follow-up for hypothyroidism with repeat thyroid function test.   -she is currently on Synthroid 112 mcg p.o. every morning.  She has no complaints today.  - She denies heat/cold intolerance. She denies palpitations nor tremors.   She denies any history of goiter. She has family history of thyroid dysfunction in one of her daughters. She denies any family history of thyroid cancer. -She reports compliance to her thyroid hormone.   Review of systems  Constitutional: + Minimally fluctuating body weight,  current Body mass index is 36.15 kg/m. , no fatigue, no subjective hyperthermia, no subjective hypothermia Eyes: no blurry vision, no xerophthalmia ENT: no sore throat, no nodules palpated in throat, no dysphagia/odynophagia, no hoarseness Cardiovascular: no chest pain, no shortness of breath, no palpitations, no leg swelling Respiratory: no cough, no shortness of breath Gastrointestinal: no nausea/vomiting/diarrhea Musculoskeletal: no muscle/joint aches, walks with cane and assistance from daughter Skin: no rashes, no hyperemia Neurological: no tremors, no numbness, no tingling, no dizziness Psychiatric: no depression, no anxiety  Objective:    BP (!) 209/93 (BP Location: Right Arm, Patient Position: Sitting)   Pulse 63   Ht 5\' 6"  (1.676 m)   Wt 224 lb (101.6 kg)   BMI 36.15 kg/m   Wt Readings from Last 3 Encounters:  01/05/20 224 lb (101.6 kg)  11/24/19 224 lb (101.6 kg)  11/11/19 224 lb (101.6 kg)    BP Readings from Last 3 Encounters:  01/05/20 (!) 209/93  11/24/19 (!) 237/102  11/11/19 (!) 160/100   Physical Exam- Limited  Constitutional:  Body mass index is 36.15 kg/m. , not in acute distress, normal state of mind Eyes:  EOMI, no exophthalmos Neck: Supple Cardiovascular: RRR, no murmers, rubs, or  gallops, no edema Respiratory: Adequate breathing efforts, no crackles, rales, rhonchi, or wheezing Musculoskeletal: no gross deformities, strength intact in all four extremities, no gross restriction of joint movements, walks with cane and assistance from daughter Skin:  no rashes, no hyperemia Neurological: no tremor with outstretched hands    Recent Results (from the past 2160 hour(s))  ECHOCARDIOGRAM COMPLETE     Status: None   Collection Time: 10/27/19  3:54 PM  Result Value Ref Range   S' Lateral 2.35 cm   Area-P 1/2 2.63 cm2   Single Plane A2C EF 64.5 %   Single Plane A4C EF 71.4 %   Calc EF 68.8 %   MV M vel 3.34 m/s   MV Peak grad 44.5 mmHg  CBC with Differential/Platelet     Status: Abnormal   Collection Time: 11/04/19 10:46 AM  Result Value Ref Range   WBC 6.4 3.8 - 10.8 Thousand/uL   RBC 4.62  3.80 - 5.10 Million/uL   Hemoglobin 12.1 11.7 - 15.5 g/dL   HCT 37.4 35.0 - 45.0 %   MCV 81.0 80.0 - 100.0 fL   MCH 26.2 (L) 27.0 - 33.0 pg   MCHC 32.4 32.0 - 36.0 g/dL   RDW 14.7 11.0 - 15.0 %   Platelets 161 140 - 400 Thousand/uL   MPV 10.4 7.5 - 12.5 fL   Neutro Abs 4,448 1,500 - 7,800 cells/uL   Lymphs Abs 1,152 850 - 3,900 cells/uL   Absolute Monocytes 550 200 - 950 cells/uL   Eosinophils Absolute 230 15 - 500 cells/uL   Basophils Absolute 19 0 - 200 cells/uL   Neutrophils Relative % 69.5 %   Total Lymphocyte 18.0 %   Monocytes Relative 8.6 %   Eosinophils Relative 3.6 %   Basophils Relative 0.3 %  Comprehensive metabolic panel     Status: Abnormal   Collection Time: 11/04/19 10:46 AM  Result Value Ref Range   Glucose, Bld 100 (H) 65 - 99 mg/dL    Comment: .            Fasting reference interval . For someone without known diabetes, a glucose value between 100 and 125 mg/dL is consistent with prediabetes and should be confirmed with a follow-up test. .    BUN 22 7 - 25 mg/dL   Creat 1.18 (H) 0.60 - 0.88 mg/dL    Comment: For patients >53 years of age, the  reference limit for Creatinine is approximately 13% higher for people identified as African-American. .    BUN/Creatinine Ratio 19 6 - 22 (calc)   Sodium 141 135 - 146 mmol/L   Potassium 3.8 3.5 - 5.3 mmol/L   Chloride 105 98 - 110 mmol/L   CO2 26 20 - 32 mmol/L   Calcium 10.1 8.6 - 10.4 mg/dL   Total Protein 7.1 6.1 - 8.1 g/dL   Albumin 4.2 3.6 - 5.1 g/dL   Globulin 2.9 1.9 - 3.7 g/dL (calc)   AG Ratio 1.4 1.0 - 2.5 (calc)   Total Bilirubin 0.7 0.2 - 1.2 mg/dL   Alkaline phosphatase (APISO) 84 37 - 153 U/L   AST 19 10 - 35 U/L   ALT 10 6 - 29 U/L  Hemoglobin A1c     Status: Abnormal   Collection Time: 11/04/19 10:46 AM  Result Value Ref Range   Hgb A1c MFr Bld 5.8 (H) <5.7 % of total Hgb    Comment: For someone without known diabetes, a hemoglobin  A1c value between 5.7% and 6.4% is consistent with prediabetes and should be confirmed with a  follow-up test. . For someone with known diabetes, a value <7% indicates that their diabetes is well controlled. A1c targets should be individualized based on duration of diabetes, age, comorbid conditions, and other considerations. . This assay result is consistent with an increased risk of diabetes. . Currently, no consensus exists regarding use of hemoglobin A1c for diagnosis of diabetes for children. .    Mean Plasma Glucose 120 (calc)   eAG (mmol/L) 6.6 (calc)  TSH     Status: Abnormal   Collection Time: 12/22/19  8:31 AM  Result Value Ref Range   TSH 16.000 (H) 0.450 - 4.500 uIU/mL  T4, free     Status: Abnormal   Collection Time: 12/22/19  8:31 AM  Result Value Ref Range   Free T4 0.73 (L) 0.82 - 1.77 ng/dL  VITAMIN D 25 Hydroxy (Vit-D Deficiency, Fractures)  Status: Abnormal   Collection Time: 12/22/19  8:31 AM  Result Value Ref Range   Vit D, 25-Hydroxy 22.9 (L) 30.0 - 100.0 ng/mL    Comment: Vitamin D deficiency has been defined by the [place] practice guideline as  a level of serum 25-OH vitamin D less than 20 ng/mL (1,2). The Endocrine Society went on to further define vitamin D insufficiency as a level between 21 and 29 ng/mL (2). 1. IOM (Institute of Medicine). 2010. Dietary reference    intakes for calcium and D. [place]: The    [employer]. 2. Holick MF, Binkley Door, Bischoff-Ferrari HA, et al.    Evaluation, treatment, and prevention of vitamin D    deficiency: an Endocrine Society clinical practice    guideline. JCEM. 2011 Jul; 96(7):1911-30.       Diabetic Labs (most recent): Lab Results  Component Value Date   HGBA1C 5.8 (H) 11/04/2019   HGBA1C 5.8 (H) 05/18/2019   HGBA1C 6.1 (H) 08/31/2017       Assessment & Plan:   1. Hypothyroidism Her previsit thyroid function tests are consistent with slight under replacement, again.  She is advised to increase Levothyroxine to 125 mcg po daily before breakfast.   - We discussed about correct intake of levothyroxine, at fasting, with water, separated by at least 30 minutes from breakfast, and separated by more than 4 hours from calcium, iron, multivitamins, acid reflux medications (PPIs). -Patient is made aware of the fact that thyroid hormone replacement is needed for life, dose to be adjusted by periodic monitoring of thyroid function tests.    2. HTN Her blood pressure today is 209/93, above target.  She reports she did not take her blood pressure medication today due to it causing her urinary frequency and she doesn't want it to interrupt her doctor's appointment.  She states she will take her medication as soon as she gets home.  I advised her to still take her other BP meds that do not typically cause urinary frequency and urgency prior to her appointments so that her BP can be controlled better.      - Time spent on this patient care encounter:  20 minutes of which 50% was spent in  counseling and the rest reviewing  her current and  previous labs / studies and  medications  doses and developing a plan for long term care. Robin [name]  participated in the discussions, expressed understanding, and voiced agreement with the above plans.  All questions were answered to her satisfaction. she is encouraged to contact clinic should she have any questions or concerns prior to her return visit.  Follow up plan: Return in about 3 months (around 04/04/2020) for Thyroid follow up, Previsit labs.  Rayetta Pigg, [facility] [address] [place], [place] [address] Phone: [phone] Fax: [phone]   01/05/2020, 11:12 AM

## 2020-01-05 NOTE — Patient Instructions (Signed)

## 2020-01-24 ENCOUNTER — Other Ambulatory Visit: Payer: Self-pay | Admitting: Family Medicine

## 2020-02-02 ENCOUNTER — Other Ambulatory Visit: Payer: Self-pay | Admitting: Family Medicine

## 2020-02-13 ENCOUNTER — Telehealth: Payer: Self-pay | Admitting: *Deleted

## 2020-02-13 NOTE — Telephone Encounter (Signed)
Received call from patient daughter [name].   Reports that patient is voicing C/O gas like pressure in chest. States that she also has neck pain and upper back pain. States that belching or passing gas does not relieve Sx.   States that patient is very uncomfortable and cannot lay down flat.   Advised to take patient to ER as Sx may be cardiac related.

## 2020-02-14 NOTE — Telephone Encounter (Signed)
Agree pt needs to be evaluated 

## 2020-02-20 ENCOUNTER — Ambulatory Visit: Payer: Medicare PPO | Admitting: Family Medicine

## 2020-02-24 ENCOUNTER — Encounter: Payer: Self-pay | Admitting: Family Medicine

## 2020-02-24 ENCOUNTER — Other Ambulatory Visit: Payer: Self-pay

## 2020-02-24 ENCOUNTER — Ambulatory Visit (INDEPENDENT_AMBULATORY_CARE_PROVIDER_SITE_OTHER): Payer: Medicare PPO | Admitting: Family Medicine

## 2020-02-24 VITALS — BP 168/84 | HR 78 | Temp 98.6°F | Resp 14 | Ht 66.0 in | Wt 224.0 lb

## 2020-02-24 DIAGNOSIS — E782 Mixed hyperlipidemia: Secondary | ICD-10-CM | POA: Diagnosis not present

## 2020-02-24 DIAGNOSIS — R7303 Prediabetes: Secondary | ICD-10-CM

## 2020-02-24 DIAGNOSIS — M25511 Pain in right shoulder: Secondary | ICD-10-CM

## 2020-02-24 DIAGNOSIS — I2510 Atherosclerotic heart disease of native coronary artery without angina pectoris: Secondary | ICD-10-CM

## 2020-02-24 DIAGNOSIS — I10 Essential (primary) hypertension: Secondary | ICD-10-CM | POA: Diagnosis not present

## 2020-02-24 NOTE — Progress Notes (Signed)
   Subjective:    Patient ID: Robin [name], female    DOB: 1940/12/15, 80 y.o.   MRN: [id number]  Patient presents for R Shoulder/ Arm Pain (X2 weeks- no known injury- decreased ROM)   Pt here with right arm pain, worse near [facility] radiates down arm,   Unable to reach above or across without pain, she has a little neck pain as well  she took some hydcodone made her sick on the stomach she did take some advil which helped a little   no injury,  No new tingling in hand,   No chest pain, no SOB , last week when they called in it was indigestion, she never went to ER    BP staying 160's  Taking losartan HCTZ 50/12.5, Coreg 12.5mg  ID  norvasc 10mg  She was started on hydrazline 25mg  BID  By cardiology at last visit    Review Of Systems:  GEN- denies fatigue, fever, weight loss,weakness, recent illness HEENT- denies eye drainage, change in vision, nasal discharge, CVS- denies chest pain, palpitations RESP- denies SOB, cough, wheeze ABD- denies N/V, change in stools, abd pain GU- denies dysuria, hematuria, dribbling, incontinence MSK- + joint pain, muscle aches, injury Neuro- denies headache, dizziness, syncope, seizure activity       Objective:    BP (!) 168/84   Pulse 78   Temp 98.6 F (37 C) (Temporal)   Resp 14   Ht 5\' 6"  (1.676 m)   Wt 224 lb (101.6 kg)   SpO2 95%   BMI 36.15 kg/m  GEN- NAD, alert and oriented x3,weight up 4lbs since August  HEENT- PERRL, EOMI, non injected sclera, pink conjunctiva, Neck- Supple, no thryomegaly,no bruit  CVS- RRR, 2/6 SEM  , HR 60's RESP-CTAB ABD-NABS,SOFT,NT,ND MSK - Fair ROM neck, decreased ROM RUE, TTP near AC, biceps in tact, unable to elevate to shoulder height  Left arm lymphedema/hemiparesis chronic  EXT moderate pedal edema bilat to mid shin , chronic lymphedema LUE Pulses- Radial 2+      Assessment & Plan:      Problem List Items Addressed This Visit      Unprioritized   Coronary atherosclerosis of native coronary  artery    Bp improved, but still a little elevated Advised if it remains > 160's/ 90 Increase hydralazine to three times a day  Continue all other meds Fasting labs obtained today       Essential hypertension, benign - Primary   Relevant Orders   CBC with Differential/Platelet   Comprehensive metabolic panel   Mixed hyperlipidemia   Relevant Orders   Hemoglobin A1c   Lipid panel   Prediabetes   Relevant Orders   Hemoglobin A1c    Other Visit Diagnoses    Acute pain of right shoulder       Tylenol for pain, avoiding NSAIDS, can not tolerate pain meds, use topicals, ortho next week, would benefit from injection      Note: This dictation was prepared with Dragon dictation along with smaller phrase technology. Any transcriptional errors that result from this process are unintentional.

## 2020-02-24 NOTE — Assessment & Plan Note (Signed)
Bp improved, but still a little elevated Advised if it remains > 160's/ 90 Increase hydralazine to three times a day  Continue all other meds Fasting labs obtained today

## 2020-02-24 NOTE — Patient Instructions (Addendum)
Increase hydralazine to  25mg  three times a day if blood pressure is staying above  160/90 Tylenol Extra strength 1000mg  twice a day for pain

## 2020-03-01 ENCOUNTER — Other Ambulatory Visit: Payer: Self-pay

## 2020-03-01 ENCOUNTER — Ambulatory Visit (INDEPENDENT_AMBULATORY_CARE_PROVIDER_SITE_OTHER): Payer: Medicare PPO | Admitting: Orthopedic Surgery

## 2020-03-01 ENCOUNTER — Encounter: Payer: Self-pay | Admitting: Orthopedic Surgery

## 2020-03-01 ENCOUNTER — Other Ambulatory Visit: Payer: Self-pay | Admitting: Orthopedic Surgery

## 2020-03-01 ENCOUNTER — Ambulatory Visit: Payer: Medicare PPO

## 2020-03-01 VITALS — BP 230/112 | HR 77 | Ht 66.0 in | Wt 228.0 lb

## 2020-03-01 DIAGNOSIS — R7303 Prediabetes: Secondary | ICD-10-CM | POA: Diagnosis not present

## 2020-03-01 DIAGNOSIS — M25511 Pain in right shoulder: Secondary | ICD-10-CM

## 2020-03-01 DIAGNOSIS — M4722 Other spondylosis with radiculopathy, cervical region: Secondary | ICD-10-CM | POA: Diagnosis not present

## 2020-03-01 DIAGNOSIS — E782 Mixed hyperlipidemia: Secondary | ICD-10-CM | POA: Diagnosis not present

## 2020-03-01 DIAGNOSIS — I10 Essential (primary) hypertension: Secondary | ICD-10-CM | POA: Diagnosis not present

## 2020-03-01 MED ORDER — GABAPENTIN 100 MG PO CAPS: 100.0000 mg | ORAL_CAPSULE | Freq: Three times a day (TID) | ORAL | 2 refills | Status: DC

## 2020-03-01 MED ORDER — BETAMETHASONE SOD PHOS & ACET 6 (3-3) MG/ML IJ SUSP
6.0000 mg | Freq: Once | INTRAMUSCULAR | Status: AC
  Administered 2020-03-01: 6 mg via INTRAMUSCULAR

## 2020-03-01 MED ORDER — GABAPENTIN 100 MG PO CAPS
100.0000 mg | ORAL_CAPSULE | Freq: Three times a day (TID) | ORAL | 2 refills | Status: DC
Start: 2020-03-01 — End: 2020-08-27

## 2020-03-01 NOTE — Patient Instructions (Addendum)
We will order an mri of your neck   Start gabapentin 100 mg 3 times a day    Cervical Radiculopathy  Cervical radiculopathy happens when a nerve in the neck (a cervical nerve) is pinched or bruised. This condition can happen because of an injury to the cervical spine (vertebrae) in the neck, or as part of the normal aging process. Pressure on the cervical nerves can cause pain or numbness that travels from the neck all the way down into the arm and fingers. Usually, this condition gets better with rest. Treatment may be needed if the condition does not improve. What are the causes? This condition may be caused by:  A neck injury.  A bulging (herniated) disk.  Muscle spasms.  Muscle tightness in the neck because of overuse.  Arthritis.  Breakdown or degeneration in the bones and joints of the spine (spondylosis) due to aging.  Bone spurs that may develop near the cervical nerves. What are the signs or symptoms? Symptoms of this condition include:  Pain. The pain may travel from the neck to the arm and hand. The pain can be severe or irritating. It may be worse when you move your neck.  Numbness or tingling in your arm or hand.  Weakness in the affected arm and hand, in severe cases. How is this diagnosed? This condition may be diagnosed based on your symptoms, your medical history, and a physical exam. You may also have tests, including:  X-rays.  A CT scan.  An MRI.  An electromyogram (EMG).  Nerve conduction tests. How is this treated? In many cases, treatment is not needed for this condition. With rest, the condition usually gets better over time. If treatment is needed, options may include:  Wearing a soft neck collar (cervical collar) for short periods of time, as told by your health care provider.  Doing physical therapy to strengthen your neck muscles.  Taking medicines, such as NSAIDs or oral corticosteroids.  Having spinal injections, in severe  cases.  Having surgery. This may be needed if other treatments do not help. Different types of surgery may be done depending on the cause of this condition. Follow these instructions at home: If you have a cervical collar:  Wear it as told by your health care provider. Remove it only as told by your health care provider.  Ask your health care provider if you can remove the collar for cleaning and bathing. If you are allowed to remove the collar for cleaning or bathing: ? Follow instructions from your health care provider about how to remove the collar safely. ? Clean the collar by wiping it with mild soap and water and drying it completely. ? Take out any removable pads in the collar every 1-2 days, and wash them by hand with soap and water. Let them air-dry completely before you put them back in the collar. ? Check your skin under the collar for irritation or sores. If you see any, tell your health care provider. Managing pain  Take over-the-counter and prescription medicines only as told by your health care provider.  If directed, put ice on the affected area. ? If you have a soft neck collar, remove it as told by your health care provider. ? Put ice in a plastic bag. ? Place a towel between your skin and the bag. ? Leave the ice on for 20 minutes, 2-3 times a day.  If applying ice does not help, you can try using heat. Use the heat  source that your health care provider recommends, such as a moist heat pack or a heating pad. ? Place a towel between your skin and the heat source. ? Leave the heat on for 20-30 minutes. ? Remove the heat if your skin turns bright red. This is especially important if you are unable to feel pain, heat, or cold. You may have a greater risk of getting burned.  Try a gentle neck and shoulder massage to help relieve symptoms.      Activity  Rest as needed.  Return to your normal activities as told by your health care provider. Ask your health care  provider what activities are safe for you.  Do stretching and strengthening exercises as told by your health care provider or physical therapist.  Do not lift anything that is heavier than 10 lb (4.5 kg) until your health care provider tells you that it is safe. General instructions  Use a flat pillow when you sleep.  Do not drive while wearing a cervical collar. If you do not have a cervical collar, ask your health care provider if it is safe to drive while your neck heals.  Ask your health care provider if the medicine prescribed to you requires you to avoid driving or using heavy machinery.  Do not use any products that contain nicotine or tobacco, such as cigarettes, e-cigarettes, and chewing tobacco. These can delay healing. If you need help quitting, ask your health care provider.  Keep all follow-up visits as told by your health care provider. This is important. Contact a health care provider if:  Your condition does not improve with treatment. Get help right away if:  Your pain gets much worse and cannot be controlled with medicines.  You have weakness or numbness in your hand, arm, face, or leg.  You have a high fever.  You have a stiff, rigid neck.  You lose control of your bowels or your bladder (have incontinence).  You have trouble with walking, balance, or speaking. Summary  Cervical radiculopathy happens when a nerve in the neck is pinched or bruised.  A nerve can get pinched from a bulging disk, arthritis, muscle spasms, or an injury to the neck.  Symptoms include pain, tingling, or numbness radiating from the neck into the arm or hand. Weakness can also occur in severe cases.  Treatment may include rest, wearing a cervical collar, and physical therapy. Medicines may be prescribed to help with pain. In severe cases, injections or surgery may be needed. This information is not intended to replace advice given to you by your health care provider. Make sure you  discuss any questions you have with your health care provider. Document Revised: 11/27/2017 Document Reviewed: 11/27/2017 Elsevier Patient Education  2021 [employer].

## 2020-03-01 NOTE — Progress Notes (Signed)
NEW PROBLEM//OFFICE VISIT  Summary assessment and plan:   80 year old female with radicular pain right arm and neck pain with symptoms since Christmas.  She did take Tylenol.  She is on Plavix cannot take anti-inflammatories says she cannot take prednisone as well.  She needs an MRI of her cervical spine to rule out herniated disc or spinal stenosis  She also will get a prescription of gabapentin  An IM shot of Celestone right arm  Chief Complaint  Patient presents with  . Neck Pain    Neck pain going down to hand, having tingling in her fingers for 43 mo.     80 year old female with pain right arm since Christmas no trauma.  Complains of numbness in the right thumb pain is unrelieved by Tylenol   Review of Systems  All other systems reviewed and are negative.  Recent physical exam by primary care was reportedly good  Past Medical History:  Diagnosis Date  . Breast cancer ([place])    Tamoxifen started 2007  . CAD (coronary artery disease)    DES second diagonal 2/12  . Cardiomyopathy    Probable Takotsubo, LVEF 30-35% 2/12  . Carotid artery disease ([place])   . Essential hypertension   . Hepatic steatosis   . Hyperlipidemia   . Hypothyroidism   . NSTEMI (non-ST elevated myocardial infarction) ([place])    2/12  . Pancreas divisum   . Stroke ([place])    (2000) residual left-sided weakness  . Type 2 diabetes mellitus ([place])     Past Surgical History:  Procedure Laterality Date  . ABDOMINAL HYSTERECTOMY    . CAROTID ENDARTERECTOMY     Left  . CHOLECYSTECTOMY    . JOINT REPLACEMENT     Right [facility]- [facility]  . MASTECTOMY     Bilateral    Family History  Problem Relation Age of Onset  . Heart attack Sister   . Heart disease Sister   . Heart attack Brother   . Heart disease Brother    Social History   Tobacco Use  . Smoking status: Never Smoker  . Smokeless tobacco: Never Used  Vaping Use  . Vaping Use: Never used  Substance Use Topics  .  Alcohol use: No    Alcohol/week: 0.0 standard drinks  . Drug use: No    Allergies  Allergen Reactions  . Codeine Nausea And Vomiting  . Tramadol     Current Meds  Medication Sig  . aspirin 81 MG tablet Take 81 mg by mouth daily.  . carvedilol (COREG) 12.5 MG tablet TAKE 1.5 TABLETS BY MOUTH TWICE DAILY  . clopidogrel (PLAVIX) 75 MG tablet TAKE 1 TABLET BY MOUTH ONCE DAILY  . gabapentin (NEURONTIN) 100 MG capsule Take 1 capsule (100 mg total) by mouth 3 (three) times daily.  Marland Kitchen levothyroxine (SYNTHROID) 125 MCG tablet Take 1 tablet (125 mcg total) by mouth daily before breakfast. TAKE 1 TABLET BY MOUTH EVERY DAY BEFORE BREAKFAST  . losartan-hydrochlorothiazide (HYZAAR) 50-12.5 MG tablet TAKE 1 TABLET BY MOUTH EVERY DAY  . rosuvastatin (CRESTOR) 40 MG tablet TAKE 1 TABLET BY MOUTH ONCE DAILY   Current Facility-Administered Medications for the 03/01/20 encounter (Office Visit) with Carole Civil, MD  Medication  . betamethasone acetate-betamethasone sodium phosphate (CELESTONE) injection 6 mg    BP (!) 230/112   Pulse 77   Ht 5\' 6"  (1.676 m)   Wt 228 lb (103.4 kg)   BMI 36.80 kg/m   Physical Exam Constitutional:  General: She is not in acute distress.    Appearance: She is well-developed.     Comments: Well developed, well nourished Normal grooming and hygiene     Cardiovascular:     Comments: No peripheral edema Skin:    General: Skin is warm and dry.  Neurological:     Mental Status: She is alert and oriented to person, place, and time.     Sensory: No sensory deficit.     Coordination: Coordination normal.     Gait: Gait normal.     Deep Tendon Reflexes: Reflexes are normal and symmetric.  Psychiatric:        Mood and Affect: Mood normal.        Behavior: Behavior normal.        Thought Content: Thought content normal.        Judgment: Judgment normal.     Comments: Affect normal     Tenderness in the midline of the cervical spine with crepitance and  decreased range of motion pain on the right side of the spine to palpation  She has weakness in C7 compared right to left numbness in the thumb compared right to left  No reflex abnormalities   MEDICAL DECISION MAKING  A.  Encounter Diagnoses  Name Primary?  . Acute pain of right shoulder   . Spondylosis of cervical spine with radiculopathy Yes    B. DATA ANALYSED:   IMAGING: Interpretation of images: Internal images show cervical spine 3 views multilevel disc disease from C2-C6 with loss of cervical lordosis and uncovertebral joint arthrosis  Orders: C-spine MRI Outside records reviewed: None   C. MANAGEMENT   Gabapentin 100 mg 3 times a day  IM shot Celestone  Return after MRI  Meds ordered this encounter  Medications  . gabapentin (NEURONTIN) 100 MG capsule    Sig: Take 1 capsule (100 mg total) by mouth 3 (three) times daily.    Dispense:  90 capsule    Refill:  2  . betamethasone acetate-betamethasone sodium phosphate (CELESTONE) injection 6 mg      Arther Abbott, MD  03/01/2020 9:25 AM

## 2020-03-01 NOTE — Addendum Note (Signed)
Addended by: Carole Civil on: 03/01/2020 09:58 AM   Modules accepted: Orders

## 2020-03-02 ENCOUNTER — Encounter: Payer: Self-pay | Admitting: *Deleted

## 2020-03-02 LAB — CBC WITH DIFFERENTIAL/PLATELET
Absolute Monocytes: 533 cells/uL (ref 200–950)
Basophils Absolute: 21 cells/uL (ref 0–200)
Basophils Relative: 0.3 %
Eosinophils Absolute: 213 cells/uL (ref 15–500)
Eosinophils Relative: 3 %
HCT: 38.4 % (ref 35.0–45.0)
Hemoglobin: 12.4 g/dL (ref 11.7–15.5)
Lymphs Abs: 1349 cells/uL (ref 850–3900)
MCH: 26.3 pg — ABNORMAL LOW (ref 27.0–33.0)
MCHC: 32.3 g/dL (ref 32.0–36.0)
MCV: 81.5 fL (ref 80.0–100.0)
MPV: 11 fL (ref 7.5–12.5)
Monocytes Relative: 7.5 %
Neutro Abs: 4984 cells/uL (ref 1500–7800)
Neutrophils Relative %: 70.2 %
Platelets: 190 10*3/uL (ref 140–400)
RBC: 4.71 10*6/uL (ref 3.80–5.10)
RDW: 15.3 % — ABNORMAL HIGH (ref 11.0–15.0)
Total Lymphocyte: 19 %
WBC: 7.1 10*3/uL (ref 3.8–10.8)

## 2020-03-02 LAB — LIPID PANEL
Cholesterol: 145 mg/dL (ref <200)
HDL: 46 mg/dL — ABNORMAL LOW (ref >=50)
LDL Cholesterol (Calc): 80 mg/dL (calc)
Non-HDL Cholesterol (Calc): 99 mg/dL (calc) (ref <130)
Total CHOL/HDL Ratio: 3.2 (calc) (ref <5.0)
Triglycerides: 109 mg/dL (ref <150)

## 2020-03-02 LAB — COMPREHENSIVE METABOLIC PANEL
AG Ratio: 1.3 (calc) (ref 1.0–2.5)
ALT: 10 U/L (ref 6–29)
AST: 20 U/L (ref 10–35)
Albumin: 4.3 g/dL (ref 3.6–5.1)
Alkaline phosphatase (APISO): 105 U/L (ref 37–153)
BUN/Creatinine Ratio: 14 (calc) (ref 6–22)
BUN: 16 mg/dL (ref 7–25)
CO2: 27 mmol/L (ref 20–32)
Calcium: 10.3 mg/dL (ref 8.6–10.4)
Chloride: 105 mmol/L (ref 98–110)
Creat: 1.16 mg/dL — ABNORMAL HIGH (ref 0.60–0.88)
Globulin: 3.2 g/dL (calc) (ref 1.9–3.7)
Glucose, Bld: 97 mg/dL (ref 65–99)
Potassium: 3.7 mmol/L (ref 3.5–5.3)
Sodium: 141 mmol/L (ref 135–146)
Total Bilirubin: 0.6 mg/dL (ref 0.2–1.2)
Total Protein: 7.5 g/dL (ref 6.1–8.1)

## 2020-03-02 LAB — HEMOGLOBIN A1C
Hgb A1c MFr Bld: 5.9 % of total Hgb — ABNORMAL HIGH (ref <5.7)
Mean Plasma Glucose: 123 mg/dL
eAG (mmol/L): 6.8 mmol/L

## 2020-03-13 ENCOUNTER — Ambulatory Visit (HOSPITAL_COMMUNITY)
Admission: RE | Admit: 2020-03-13 | Discharge: 2020-03-13 | Disposition: A | Discharge status: Home / Self Care | Payer: Medicare PPO | Source: Ambulatory Visit | Attending: Orthopedic Surgery | Admitting: Orthopedic Surgery

## 2020-03-13 ENCOUNTER — Other Ambulatory Visit: Payer: Self-pay

## 2020-03-13 DIAGNOSIS — M47812 Spondylosis without myelopathy or radiculopathy, cervical region: Secondary | ICD-10-CM | POA: Diagnosis not present

## 2020-03-13 DIAGNOSIS — M4722 Other spondylosis with radiculopathy, cervical region: Secondary | ICD-10-CM | POA: Insufficient documentation

## 2020-03-13 DIAGNOSIS — M4802 Spinal stenosis, cervical region: Secondary | ICD-10-CM | POA: Diagnosis not present

## 2020-03-13 DIAGNOSIS — M50221 Other cervical disc displacement at C4-C5 level: Secondary | ICD-10-CM | POA: Diagnosis not present

## 2020-03-21 ENCOUNTER — Other Ambulatory Visit: Payer: Self-pay

## 2020-03-21 ENCOUNTER — Ambulatory Visit: Payer: Medicare PPO | Admitting: Orthopedic Surgery

## 2020-03-21 ENCOUNTER — Encounter: Payer: Self-pay | Admitting: Orthopedic Surgery

## 2020-03-21 VITALS — BP 227/98 | HR 65 | Ht 66.0 in | Wt 228.0 lb

## 2020-03-21 DIAGNOSIS — M4722 Other spondylosis with radiculopathy, cervical region: Secondary | ICD-10-CM

## 2020-03-21 NOTE — Progress Notes (Signed)
Chief Complaint  Patient presents with   Results    Review MRI     80 year old female with right upper extremity radicular pain sent for MRI however today she says her arm feels better.  My impression is that she has multilevel cervical disc disease with degeneration and spondylosis and probably C4-5 or C5-6 as the causative anatomy but  She is doing well now she has full range of motion of the shoulder without pain  MRI report IMPRESSION: Multilevel spondylosis. Mild C3-6 and moderate C6-7 spinal canal narrowing.  Mild bilateral C2-C6 and right C6-7 neural foraminal narrowing. Moderate left C6-7 neural foraminal narrowing.  Electronically Signed: By: Primitivo Gauze M.D. On: 03/13/2020 10:28  Follow-up as needed

## 2020-03-21 NOTE — Patient Instructions (Signed)
Call if needed

## 2020-03-29 ENCOUNTER — Other Ambulatory Visit: Payer: Self-pay

## 2020-03-29 ENCOUNTER — Ambulatory Visit (INDEPENDENT_AMBULATORY_CARE_PROVIDER_SITE_OTHER): Payer: Medicare PPO | Admitting: Internal Medicine

## 2020-03-29 ENCOUNTER — Encounter: Payer: Self-pay | Admitting: Internal Medicine

## 2020-03-29 VITALS — BP 142/84 | HR 77 | Resp 18 | Ht 66.0 in | Wt 220.4 lb

## 2020-03-29 DIAGNOSIS — E039 Hypothyroidism, unspecified: Secondary | ICD-10-CM | POA: Diagnosis not present

## 2020-03-29 DIAGNOSIS — I779 Disorder of arteries and arterioles, unspecified: Secondary | ICD-10-CM

## 2020-03-29 DIAGNOSIS — E559 Vitamin D deficiency, unspecified: Secondary | ICD-10-CM

## 2020-03-29 DIAGNOSIS — I10 Essential (primary) hypertension: Secondary | ICD-10-CM | POA: Diagnosis not present

## 2020-03-29 DIAGNOSIS — Z7689 Persons encountering health services in other specified circumstances: Secondary | ICD-10-CM | POA: Diagnosis not present

## 2020-03-29 DIAGNOSIS — M1611 Unilateral primary osteoarthritis, right hip: Secondary | ICD-10-CM | POA: Diagnosis not present

## 2020-03-29 DIAGNOSIS — I429 Cardiomyopathy, unspecified: Secondary | ICD-10-CM

## 2020-03-29 DIAGNOSIS — I2510 Atherosclerotic heart disease of native coronary artery without angina pectoris: Secondary | ICD-10-CM

## 2020-03-29 DIAGNOSIS — E782 Mixed hyperlipidemia: Secondary | ICD-10-CM | POA: Diagnosis not present

## 2020-03-29 NOTE — Assessment & Plan Note (Signed)
Lab Results  Component Value Date   TSH 16.000 (H) 12/22/2019   On Levothyroxine 125 mcg QD Check TSH and free T4 in the next visit

## 2020-03-29 NOTE — Assessment & Plan Note (Signed)
On Aspirin, Plavix and statin

## 2020-03-29 NOTE — Assessment & Plan Note (Signed)
Chronic hip pain Tylenol PRN Ambulate as tolerated

## 2020-03-29 NOTE — Assessment & Plan Note (Signed)
H/o probable Takotsubo cardiomyopathy with normalization of LVEF later on Follows up with Cardiologist On B-blocker, ARB and Lasix 

## 2020-03-29 NOTE — Assessment & Plan Note (Signed)
S/p left carotid endarterectomy On DAPT and statin 

## 2020-03-29 NOTE — Assessment & Plan Note (Signed)
On Vit D supplements

## 2020-03-29 NOTE — Patient Instructions (Addendum)
Please continue to take medications as prescribed.  Please contact your Cardiologist to ask about Aspirin and Plavix.  Continue to follow low salt diet and ambulate as tolerated.

## 2020-03-29 NOTE — Assessment & Plan Note (Addendum)
Care established History and medications reviewed with the patient 

## 2020-03-29 NOTE — Assessment & Plan Note (Addendum)
BP Readings from Last 1 Encounters:  03/29/20 (!) 142/84   Elevated, but would not be very aggressive with her HTN On Amlodipine, Hyzaar, Hydralazine and Coreg Counseled for compliance with the medications Low salt diet and ambulate as tolerated

## 2020-03-29 NOTE — Assessment & Plan Note (Addendum)
On Crestor and Omega-3

## 2020-03-29 NOTE — Progress Notes (Signed)
New Patient Office Visit  Subjective:  Patient ID: Robin [name], female    DOB: 12-14-1940  Age: 80 y.o. MRN: [id number]  CC:  Chief Complaint  Patient presents with   New Patient (Initial Visit)    New patient former dr Buelah Manis pt has arthritis and it bothers her a lot     HPI Robin [name] is a 80 year old female with PMH of HTN, CAD s/p stent placement, carotid artery occlusion s/p carotid endarterectomy, hypothyroidism, generalized OA and GERD who presents for establishing care. She is a former patient of Dr Buelah Manis.  She has been doing well overall.  Her BP was elevated today, but has been stable overall in previous visits with her PCP. She denies any headache, dizziness, chest pain, dyspnea or palpitations. She follows up with Cardiologist for h/o CAD s/p stent placement. She also has had carotid endarterectomy and has been on DAPT and statin.  She has been on Levothyroxine for hypothyroidism.  She has h/o generalized OA and has been having chronic hip and knee pain. Denies any recent change in pain.  She is up-to-date with COVID and flu vaccine.  Past Medical History:  Diagnosis Date   Breast cancer ([place])    Tamoxifen started 2007   CAD (coronary artery disease)    DES second diagonal 2/12   Cardiomyopathy    Probable Takotsubo, LVEF 30-35% 2/12   Carotid artery disease ([facility])    Essential hypertension    Hepatic steatosis    Hyperlipidemia    Hypothyroidism    NSTEMI (non-ST elevated myocardial infarction) ([place])    2/12   Pancreas divisum    Stroke ([place])    (2000) residual left-sided weakness   Type 2 diabetes mellitus ([facility])     Past Surgical History:  Procedure Laterality Date   ABDOMINAL HYSTERECTOMY     CAROTID ENDARTERECTOMY     Left   CHOLECYSTECTOMY     JOINT REPLACEMENT     [facility]   MASTECTOMY     Bilateral    Family History  Problem Relation Age of Onset   Heart attack Sister     Heart disease Sister    Heart attack Brother    Heart disease Brother     Social History   Socioeconomic History   Marital status: Widowed    Spouse name: Not on file   Number of children: Not on file   Years of education: Not on file   Highest education level: Not on file  Occupational History   Not on file  Tobacco Use   Smoking status: Never Smoker   Smokeless tobacco: Never Used  Vaping Use   Vaping Use: Never used  Substance and Sexual Activity   Alcohol use: No    Alcohol/week: 0.0 standard drinks   Drug use: No   Sexual activity: Not on file  Other Topics Concern   Not on file  Social History Narrative   Not on file   Social Determinants of Health   Financial Resource Strain: Not on file  Food Insecurity: Not on file  Transportation Needs: Not on file  Physical Activity: Not on file  Stress: Not on file  Social Connections: Not on file  Intimate Partner Violence: Not on file    ROS Review of Systems  Constitutional: Negative for chills and fever.  HENT: Negative for congestion, sinus pressure, sinus pain and sore throat.   Eyes: Negative for pain and discharge.  Respiratory:  Negative for cough and shortness of breath.   Cardiovascular: Negative for chest pain and palpitations.  Gastrointestinal: Negative for abdominal pain, constipation, diarrhea, nausea and vomiting.  Endocrine: Negative for polydipsia and polyuria.  Genitourinary: Negative for dysuria and hematuria.  Musculoskeletal: Positive for arthralgias. Negative for neck pain and neck stiffness.  Skin: Negative for rash.  Neurological: Negative for dizziness and weakness.  Psychiatric/Behavioral: Negative for agitation and behavioral problems.    Objective:   Today's Vitals: BP (!) 142/84 (BP Location: Right Arm, Patient Position: Sitting, Cuff Size: Normal)    Pulse 77    Resp 18    Ht 5\' 6"  (1.676 m)    Wt 220 lb 6.4 oz (100 kg)    SpO2 97%    BMI 35.57 kg/m   Physical  Exam Vitals reviewed.  Constitutional:      General: She is not in acute distress.    Appearance: She is not diaphoretic.  HENT:     Head: Normocephalic and atraumatic.     Nose: Nose normal.     Mouth/Throat:     Mouth: Mucous membranes are moist.  Eyes:     General: No scleral icterus.    Extraocular Movements: Extraocular movements intact.     Pupils: Pupils are equal, round, and reactive to light.  Cardiovascular:     Rate and Rhythm: Normal rate and regular rhythm.     Pulses: Normal pulses.     Heart sounds: Normal heart sounds. No murmur heard.   Pulmonary:     Breath sounds: Normal breath sounds. No wheezing or rales.  Musculoskeletal:     Cervical back: Neck supple. No tenderness.     Right lower leg: No edema.     Left lower leg: No edema.  Skin:    General: Skin is warm.     Findings: No rash.  Neurological:     General: No focal deficit present.     Mental Status: She is alert and oriented to person, place, and time.  Psychiatric:        Mood and Affect: Mood normal.        Behavior: Behavior normal.     Assessment & Plan:   Problem List Items Addressed This Visit      Encounter to establish care - Primary   Care established History and medications reviewed with the patient        Cardiovascular and Mediastinum   Secondary cardiomyopathy ([place])    H/o probable Takotsubo cardiomyopathy with normalization of LVEF later on Follows up with Cardiologist On B-blocker, ARB and Lasix      Coronary atherosclerosis of native coronary artery    On Aspirin, Plavix and statin      Essential hypertension, benign    BP Readings from Last 1 Encounters:  03/29/20 (!) 142/84   Elevated, but would not be very aggressive with her HTN On Amlodipine, Hyzaar, Hydralazine and Coreg Counseled for compliance with the medications Low salt diet and ambulate as tolerated       Carotid artery disease ([place])    S/p left carotid endarterectomy On DAPT and statin         Endocrine   Hypothyroidism    Lab Results  Component Value Date   TSH 16.000 (H) 12/22/2019   On Levothyroxine 125 mcg QD Check TSH and free T4 in the next visit        Musculoskeletal and Integument   OA (osteoarthritis) of hip    Chronic hip  pain Tylenol PRN Ambulate as tolerated        Other   Mixed hyperlipidemia    On Crestor and Omega-3      Vitamin D deficiency    On Vit D supplements        Outpatient Encounter Medications as of 03/29/2020  Medication Sig   amLODipine (NORVASC) 10 MG tablet TAKE 1 TABLET BY MOUTH ONCE DAILY   aspirin 81 MG tablet Take 81 mg by mouth daily.   carvedilol (COREG) 12.5 MG tablet TAKE 1.5 TABLETS BY MOUTH TWICE DAILY   Cholecalciferol (VITAMIN D3) 125 MCG (5000 UT) CAPS TAKE 1 CAPSULE BY MOUTH ONCE DAILY   clopidogrel (PLAVIX) 75 MG tablet TAKE 1 TABLET BY MOUTH ONCE DAILY   docusate sodium (COLACE) 100 MG capsule Take 100 mg by mouth as needed for mild constipation.   furosemide (LASIX) 20 MG tablet TAKE 1 TABLET BY MOUTH EVERY DAY AS NEEDED AS DIRECTED   gabapentin (NEURONTIN) 100 MG capsule Take 1 capsule (100 mg total) by mouth 3 (three) times daily.   levothyroxine (SYNTHROID) 125 MCG tablet Take 1 tablet (125 mcg total) by mouth daily before breakfast. TAKE 1 TABLET BY MOUTH EVERY DAY BEFORE BREAKFAST   losartan-hydrochlorothiazide (HYZAAR) 50-12.5 MG tablet TAKE 1 TABLET BY MOUTH EVERY DAY   nitroGLYCERIN (NITROSTAT) 0.4 MG SL tablet Place 1 tablet (0.4 mg total) under the tongue every 5 (five) minutes x 3 doses as needed.   Omega-3 Fatty Acids 300 MG CAPS Take 1 capsule (300 mg total) by mouth 2 (two) times daily.   pantoprazole (PROTONIX) 40 MG tablet    rosuvastatin (CRESTOR) 40 MG tablet TAKE 1 TABLET BY MOUTH ONCE DAILY   [DISCONTINUED] ibuprofen (ADVIL,MOTRIN) 600 MG tablet Take 600 mg by mouth every 6 (six) hours as needed for fever or headache.   hydrALAZINE (APRESOLINE) 25 MG tablet Take 1  tablet (25 mg total) by mouth in the morning and at bedtime.   No facility-administered encounter medications on file as of 03/29/2020.    Follow-up: Return in about 4 months (around 07/29/2020).   [name], MD

## 2020-03-30 LAB — TSH: TSH: 10.6 u[IU]/mL — ABNORMAL HIGH (ref 0.450–4.500)

## 2020-03-30 LAB — T4, FREE: Free T4: 0.9 ng/dL (ref 0.82–1.77)

## 2020-03-31 ENCOUNTER — Other Ambulatory Visit: Payer: Self-pay | Admitting: Family Medicine

## 2020-04-04 ENCOUNTER — Encounter (HOSPITAL_COMMUNITY): Payer: Self-pay

## 2020-04-04 ENCOUNTER — Observation Stay (HOSPITAL_COMMUNITY)
Admission: EM | Admit: 2020-04-04 | Discharge: 2020-04-06 | Disposition: A | Discharge status: Home / Self Care | Payer: Medicare PPO | Attending: Family Medicine | Admitting: Family Medicine

## 2020-04-04 ENCOUNTER — Emergency Department (HOSPITAL_COMMUNITY): Payer: Medicare PPO

## 2020-04-04 ENCOUNTER — Other Ambulatory Visit: Payer: Self-pay

## 2020-04-04 ENCOUNTER — Encounter: Payer: Self-pay | Admitting: Nurse Practitioner

## 2020-04-04 ENCOUNTER — Ambulatory Visit: Payer: Medicare PPO | Admitting: Nurse Practitioner

## 2020-04-04 DIAGNOSIS — Z9049 Acquired absence of other specified parts of digestive tract: Secondary | ICD-10-CM | POA: Diagnosis not present

## 2020-04-04 DIAGNOSIS — I82B12 Acute embolism and thrombosis of left subclavian vein: Secondary | ICD-10-CM | POA: Insufficient documentation

## 2020-04-04 DIAGNOSIS — E119 Type 2 diabetes mellitus without complications: Secondary | ICD-10-CM | POA: Diagnosis not present

## 2020-04-04 DIAGNOSIS — Z7982 Long term (current) use of aspirin: Secondary | ICD-10-CM | POA: Diagnosis not present

## 2020-04-04 DIAGNOSIS — I252 Old myocardial infarction: Secondary | ICD-10-CM | POA: Diagnosis not present

## 2020-04-04 DIAGNOSIS — I82409 Acute embolism and thrombosis of unspecified deep veins of unspecified lower extremity: Secondary | ICD-10-CM | POA: Diagnosis not present

## 2020-04-04 DIAGNOSIS — E039 Hypothyroidism, unspecified: Secondary | ICD-10-CM

## 2020-04-04 DIAGNOSIS — I82622 Acute embolism and thrombosis of deep veins of left upper extremity: Secondary | ICD-10-CM

## 2020-04-04 DIAGNOSIS — Z96651 Presence of right artificial knee joint: Secondary | ICD-10-CM | POA: Insufficient documentation

## 2020-04-04 DIAGNOSIS — Z885 Allergy status to narcotic agent status: Secondary | ICD-10-CM | POA: Diagnosis not present

## 2020-04-04 DIAGNOSIS — Z20822 Contact with and (suspected) exposure to covid-19: Secondary | ICD-10-CM | POA: Insufficient documentation

## 2020-04-04 DIAGNOSIS — I82402 Acute embolism and thrombosis of unspecified deep veins of left lower extremity: Secondary | ICD-10-CM | POA: Diagnosis not present

## 2020-04-04 DIAGNOSIS — C50919 Malignant neoplasm of unspecified site of unspecified female breast: Secondary | ICD-10-CM

## 2020-04-04 DIAGNOSIS — I6529 Occlusion and stenosis of unspecified carotid artery: Secondary | ICD-10-CM | POA: Diagnosis not present

## 2020-04-04 DIAGNOSIS — Z79899 Other long term (current) drug therapy: Secondary | ICD-10-CM | POA: Diagnosis not present

## 2020-04-04 DIAGNOSIS — I10 Essential (primary) hypertension: Secondary | ICD-10-CM | POA: Diagnosis not present

## 2020-04-04 DIAGNOSIS — Z8673 Personal history of transient ischemic attack (TIA), and cerebral infarction without residual deficits: Secondary | ICD-10-CM | POA: Insufficient documentation

## 2020-04-04 DIAGNOSIS — E669 Obesity, unspecified: Secondary | ICD-10-CM | POA: Diagnosis not present

## 2020-04-04 DIAGNOSIS — E782 Mixed hyperlipidemia: Secondary | ICD-10-CM | POA: Diagnosis not present

## 2020-04-04 DIAGNOSIS — Z853 Personal history of malignant neoplasm of breast: Secondary | ICD-10-CM | POA: Insufficient documentation

## 2020-04-04 DIAGNOSIS — Z7902 Long term (current) use of antithrombotics/antiplatelets: Secondary | ICD-10-CM | POA: Insufficient documentation

## 2020-04-04 DIAGNOSIS — R0602 Shortness of breath: Secondary | ICD-10-CM | POA: Diagnosis not present

## 2020-04-04 DIAGNOSIS — C7931 Secondary malignant neoplasm of brain: Secondary | ICD-10-CM

## 2020-04-04 DIAGNOSIS — Z170 Estrogen receptor positive status [ER+]: Secondary | ICD-10-CM

## 2020-04-04 DIAGNOSIS — I82C12 Acute embolism and thrombosis of left internal jugular vein: Secondary | ICD-10-CM | POA: Insufficient documentation

## 2020-04-04 DIAGNOSIS — R222 Localized swelling, mass and lump, trunk: Secondary | ICD-10-CM | POA: Insufficient documentation

## 2020-04-04 DIAGNOSIS — Z90710 Acquired absence of both cervix and uterus: Secondary | ICD-10-CM | POA: Insufficient documentation

## 2020-04-04 DIAGNOSIS — R778 Other specified abnormalities of plasma proteins: Secondary | ICD-10-CM | POA: Insufficient documentation

## 2020-04-04 DIAGNOSIS — Z6832 Body mass index (BMI) 32.0-32.9, adult: Secondary | ICD-10-CM | POA: Insufficient documentation

## 2020-04-04 DIAGNOSIS — I429 Cardiomyopathy, unspecified: Secondary | ICD-10-CM | POA: Insufficient documentation

## 2020-04-04 DIAGNOSIS — Z9013 Acquired absence of bilateral breasts and nipples: Secondary | ICD-10-CM | POA: Insufficient documentation

## 2020-04-04 DIAGNOSIS — I2510 Atherosclerotic heart disease of native coronary artery without angina pectoris: Secondary | ICD-10-CM | POA: Insufficient documentation

## 2020-04-04 DIAGNOSIS — C50912 Malignant neoplasm of unspecified site of left female breast: Secondary | ICD-10-CM

## 2020-04-04 DIAGNOSIS — I779 Disorder of arteries and arterioles, unspecified: Secondary | ICD-10-CM | POA: Diagnosis present

## 2020-04-04 LAB — CBC WITH DIFFERENTIAL/PLATELET
Abs Immature Granulocytes: 0.05 10*3/uL (ref 0.00–0.07)
Basophils Absolute: 0 10*3/uL (ref 0.0–0.1)
Basophils Relative: 0 %
Eosinophils Absolute: 0.2 10*3/uL (ref 0.0–0.5)
Eosinophils Relative: 2 %
HCT: 36.4 % (ref 36.0–46.0)
Hemoglobin: 11.6 g/dL — ABNORMAL LOW (ref 12.0–15.0)
Immature Granulocytes: 1 %
Lymphocytes Relative: 12 %
Lymphs Abs: 0.9 10*3/uL (ref 0.7–4.0)
MCH: 26.7 pg (ref 26.0–34.0)
MCHC: 31.9 g/dL (ref 30.0–36.0)
MCV: 83.9 fL (ref 80.0–100.0)
Monocytes Absolute: 0.5 10*3/uL (ref 0.1–1.0)
Monocytes Relative: 7 %
Neutro Abs: 5.8 10*3/uL (ref 1.7–7.7)
Neutrophils Relative %: 78 %
Platelets: 167 10*3/uL (ref 150–400)
RBC: 4.34 MIL/uL (ref 3.87–5.11)
RDW: 16 % — ABNORMAL HIGH (ref 11.5–15.5)
WBC: 7.4 10*3/uL (ref 4.0–10.5)
nRBC: 0 % (ref 0.0–0.2)

## 2020-04-04 LAB — COMPREHENSIVE METABOLIC PANEL
ALT: 14 U/L (ref 0–44)
AST: 29 U/L (ref 15–41)
Albumin: 3.9 g/dL (ref 3.5–5.0)
Alkaline Phosphatase: 100 U/L (ref 38–126)
Anion gap: 11 (ref 5–15)
BUN: 23 mg/dL (ref 8–23)
CO2: 24 mmol/L (ref 22–32)
Calcium: 9.6 mg/dL (ref 8.9–10.3)
Chloride: 105 mmol/L (ref 98–111)
Creatinine, Ser: 1.19 mg/dL — ABNORMAL HIGH (ref 0.44–1.00)
GFR, Estimated: 46 mL/min — ABNORMAL LOW (ref >60)
Glucose, Bld: 101 mg/dL — ABNORMAL HIGH (ref 70–99)
Potassium: 3.4 mmol/L — ABNORMAL LOW (ref 3.5–5.1)
Sodium: 140 mmol/L (ref 135–145)
Total Bilirubin: 0.9 mg/dL (ref 0.3–1.2)
Total Protein: 7.5 g/dL (ref 6.5–8.1)

## 2020-04-04 LAB — TROPONIN I (HIGH SENSITIVITY)
Troponin I (High Sensitivity): 36 ng/L — ABNORMAL HIGH (ref <18)
Troponin I (High Sensitivity): 64 ng/L — ABNORMAL HIGH (ref <18)
Troponin I (High Sensitivity): 137 ng/L (ref <18)

## 2020-04-04 LAB — BRAIN NATRIURETIC PEPTIDE: B Natriuretic Peptide: 92 pg/mL (ref 0.0–100.0)

## 2020-04-04 LAB — HEPARIN LEVEL (UNFRACTIONATED): Heparin Unfractionated: 0.9 IU/mL — ABNORMAL HIGH (ref 0.30–0.70)

## 2020-04-04 MED ORDER — CARVEDILOL 12.5 MG PO TABS: 12.5000 mg | ORAL_TABLET | Freq: Two times a day (BID) | ORAL | Status: DC

## 2020-04-04 MED ORDER — CARVEDILOL 12.5 MG PO TABS
12.5000 mg | ORAL_TABLET | Freq: Two times a day (BID) | ORAL | Status: DC
  Administered 2020-04-05 – 2020-04-06 (×2): 12.5 mg via ORAL
  Filled 2020-04-04 (×3): qty 1

## 2020-04-04 MED ORDER — ACETAMINOPHEN 325 MG PO TABS
650.0000 mg | ORAL_TABLET | Freq: Four times a day (QID) | ORAL | Status: DC | PRN
  Administered 2020-04-06: 650 mg via ORAL
  Filled 2020-04-04: qty 2

## 2020-04-04 MED ORDER — IOHEXOL 350 MG/ML SOLN
75.0000 mL | Freq: Once | INTRAVENOUS | Status: AC | PRN
  Administered 2020-04-04: 75 mL via INTRAVENOUS

## 2020-04-04 MED ORDER — ROSUVASTATIN CALCIUM 20 MG PO TABS
40.0000 mg | ORAL_TABLET | Freq: Every day | ORAL | Status: DC
  Administered 2020-04-04 – 2020-04-06 (×3): 40 mg via ORAL
  Filled 2020-04-04 (×3): qty 2

## 2020-04-04 MED ORDER — HYDRALAZINE HCL 20 MG/ML IJ SOLN: 5.0000 mg | Freq: Four times a day (QID) | INTRAMUSCULAR | Status: DC | PRN

## 2020-04-04 MED ORDER — AMLODIPINE BESYLATE 5 MG PO TABS
10.0000 mg | ORAL_TABLET | Freq: Every day | ORAL | Status: DC
  Administered 2020-04-04 – 2020-04-06 (×3): 10 mg via ORAL
  Filled 2020-04-04 (×3): qty 2

## 2020-04-04 MED ORDER — ACETAMINOPHEN 650 MG RE SUPP: 650.0000 mg | Freq: Four times a day (QID) | RECTAL | Status: DC | PRN

## 2020-04-04 MED ORDER — HEPARIN (PORCINE) 25000 UT/250ML-% IV SOLN
1150.0000 [IU]/h | INTRAVENOUS | Status: DC
  Administered 2020-04-04: 1300 [IU]/h via INTRAVENOUS
  Filled 2020-04-04: qty 250

## 2020-04-04 MED ORDER — HYDRALAZINE HCL 25 MG PO TABS
25.0000 mg | ORAL_TABLET | Freq: Three times a day (TID) | ORAL | Status: DC
  Administered 2020-04-04 – 2020-04-05 (×2): 25 mg via ORAL
  Filled 2020-04-04 (×2): qty 1

## 2020-04-04 MED ORDER — LOSARTAN POTASSIUM-HCTZ 50-12.5 MG PO TABS: 1.0000 | ORAL_TABLET | Freq: Every day | ORAL | Status: DC

## 2020-04-04 MED ORDER — IOHEXOL 300 MG/ML  SOLN: 75.0000 mL | Freq: Once | INTRAMUSCULAR | Status: DC | PRN

## 2020-04-04 MED ORDER — HYDROCHLOROTHIAZIDE 12.5 MG PO CAPS
12.5000 mg | ORAL_CAPSULE | Freq: Every day | ORAL | Status: DC
  Administered 2020-04-04: 12.5 mg via ORAL
  Filled 2020-04-04 (×2): qty 1

## 2020-04-04 MED ORDER — LEVOTHYROXINE SODIUM 137 MCG PO TABS: 137.0000 ug | ORAL_TABLET | Freq: Every day | ORAL | 3 refills | Status: DC

## 2020-04-04 MED ORDER — LEVOTHYROXINE SODIUM 137 MCG PO TABS
137.0000 ug | ORAL_TABLET | Freq: Every day | ORAL | Status: DC
  Administered 2020-04-05 – 2020-04-06 (×2): 137 ug via ORAL
  Filled 2020-04-04 (×2): qty 1

## 2020-04-04 MED ORDER — HEPARIN BOLUS VIA INFUSION
4800.0000 [IU] | Freq: Once | INTRAVENOUS | Status: AC
  Administered 2020-04-04: 4800 [IU] via INTRAVENOUS

## 2020-04-04 MED ORDER — GABAPENTIN 100 MG PO CAPS
100.0000 mg | ORAL_CAPSULE | Freq: Three times a day (TID) | ORAL | Status: DC
  Administered 2020-04-04 – 2020-04-06 (×5): 100 mg via ORAL
  Filled 2020-04-04 (×5): qty 1

## 2020-04-04 MED ORDER — LOSARTAN POTASSIUM 50 MG PO TABS
50.0000 mg | ORAL_TABLET | Freq: Every day | ORAL | Status: DC
  Administered 2020-04-04: 50 mg via ORAL
  Filled 2020-04-04 (×2): qty 1

## 2020-04-04 MED ORDER — PANTOPRAZOLE SODIUM 40 MG PO TBEC
40.0000 mg | DELAYED_RELEASE_TABLET | Freq: Every day | ORAL | Status: DC
  Administered 2020-04-04 – 2020-04-06 (×3): 40 mg via ORAL
  Filled 2020-04-04 (×3): qty 1

## 2020-04-04 MED ORDER — OMEGA-3-ACID ETHYL ESTERS 1 G PO CAPS
1000.0000 mg | ORAL_CAPSULE | Freq: Two times a day (BID) | ORAL | Status: DC
  Administered 2020-04-04 – 2020-04-06 (×4): 1000 mg via ORAL
  Filled 2020-04-04 (×4): qty 1

## 2020-04-04 MED ORDER — VITAMIN D 25 MCG (1000 UNIT) PO TABS
5000.0000 [IU] | ORAL_TABLET | Freq: Every day | ORAL | Status: DC
  Administered 2020-04-04 – 2020-04-06 (×3): 5000 [IU] via ORAL
  Filled 2020-04-04 (×3): qty 5

## 2020-04-04 NOTE — H&P (Signed)
TRH H&P   Patient Demographics:    Robin [name], is a 80 y.o. female  MRN: [id number]   DOB - 12-Jan-1941  Admit Date - 04/04/2020  Outpatient Primary MD for the patient is [name], MD  Referring MD/NP/PA: PA Green  Patient coming from: Home  Chief Complaint  Patient presents with  . Hypertension      HPI:    Robin [name]  is a 80 y.o. female, with past medical history of hypertension, hyperlipidemia, carotid artery stenosis status post endarterectomy, CAD, cardiomyopathy, CVA, hypothyroidism, history of breast cancer , in remission for last 8 years, patient was seen by PCP today, she was noted with elevated blood pressure at her endocrinologist office today, who recommended her to come to ED for further evaluation, blood pressure was 207/98, patient did complain of left arm swelling for the past week, as well she does report some dyspnea, mainly with exertion, not follow with her cardiologist [name] in a while, she denies any chest pain, hemoptysis, calf pain, no fever, no chills, no weight loss. - in ED venous Doppler significant for extensive left upper extremity DVT, CT chest with no evidence of PE, but was significant for of breast cancer with mass and bony metastasis, patient was started on heparin drip, [facility]ist consulted to admit.    Review of systems:    In addition to the HPI above,  No Fever-chills, No Headache, No changes with Vision or hearing, No problems swallowing food or Liquids, No Chest pain, Cough, she does report exertional dyspnea No Abdominal pain, No Nausea or Vommitting, Bowel movements are regular, No Blood in stool or Urine, No dysuria, No new skin rashes or bruises, Reports left arm pain. No new weakness, tingling, numbness in any extremity, No recent weight gain or loss, No polyuria, polydypsia or polyphagia, No  significant Mental Stressors.  A full 10 point Review of Systems was done, except as stated above, all other Review of Systems were negative.   With Past History of the following :    Past Medical History:  Diagnosis Date  . Breast cancer ([place])    Tamoxifen started 2007  . CAD (coronary artery disease)    DES second diagonal 2/12  . Cardiomyopathy    Probable Takotsubo, LVEF 30-35% 2/12  . Carotid artery disease ([place])   . Essential hypertension   . Hepatic steatosis   . Hyperlipidemia   . Hypothyroidism   . NSTEMI (non-ST elevated myocardial infarction) ([place])    2/12  . Pancreas divisum   . Stroke ([place])    (2000) residual left-sided weakness  . Type 2 diabetes mellitus ([place])       Past Surgical History:  Procedure Laterality Date  . ABDOMINAL HYSTERECTOMY    . CAROTID ENDARTERECTOMY     Left  . CHOLECYSTECTOMY    . JOINT REPLACEMENT     Right  knee replacement- [facility]. [facility]  . MASTECTOMY     Bilateral      Social History:     Social History   Tobacco Use  . Smoking status: Never Smoker  . Smokeless tobacco: Never Used  Substance Use Topics  . Alcohol use: No    Alcohol/week: 0.0 standard drinks      Family History :     Family History  Problem Relation Age of Onset  . Heart attack Sister   . Heart disease Sister   . Heart attack Brother   . Heart disease Brother     Home Medications:   Prior to Admission medications   Medication Sig Start Date End Date Taking? Authorizing Provider  amLODipine (NORVASC) 10 MG tablet TAKE 1 TABLET BY MOUTH ONCE DAILY 05/21/18   Alycia Rossetti, MD  aspirin 81 MG tablet Take 81 mg by mouth daily.    [provider]  carvedilol (COREG) 12.5 MG tablet TAKE 1.5 TABLETS BY MOUTH TWICE DAILY 09/08/19   Alycia Rossetti, MD  Cholecalciferol (VITAMIN D3) 125 MCG (5000 UT) CAPS TAKE 1 CAPSULE BY MOUTH ONCE DAILY 01/14/18   Cassandria Anger, MD  clopidogrel (PLAVIX) 75 MG tablet TAKE 1 TABLET BY  MOUTH ONCE DAILY 01/25/20   Alycia Rossetti, MD  docusate sodium (COLACE) 100 MG capsule Take 100 mg by mouth as needed for mild constipation.    [provider]  furosemide (LASIX) 20 MG tablet TAKE 1 TABLET BY MOUTH EVERY DAY AS NEEDED AS DIRECTED 02/03/20   Alycia Rossetti, MD  gabapentin (NEURONTIN) 100 MG capsule Take 1 capsule (100 mg total) by mouth 3 (three) times daily. 03/01/20   Carole Civil, MD  hydrALAZINE (APRESOLINE) 25 MG tablet Take 1 tablet (25 mg total) by mouth in the morning and at bedtime. 11/11/19 02/09/20  Verta Ellen., NP  levothyroxine (SYNTHROID) 137 MCG tablet Take 1 tablet (137 mcg total) by mouth daily before breakfast. TAKE 1 TABLET BY MOUTH EVERY DAY BEFORE BREAKFAST 04/04/20   Brita Romp, NP  losartan-hydrochlorothiazide (HYZAAR) 50-12.5 MG tablet TAKE 1 TABLET BY MOUTH EVERY DAY 09/12/19   Basco, Modena Nunnery, MD  nitroGLYCERIN (NITROSTAT) 0.4 MG SL tablet Place 1 tablet (0.4 mg total) under the tongue every 5 (five) minutes x 3 doses as needed. 01/11/14   Satira Sark, MD  Omega-3 Fatty Acids 300 MG CAPS Take 1 capsule (300 mg total) by mouth 2 (two) times daily. 04/24/11   Serpe, Burna Forts, PA-C  pantoprazole (PROTONIX) 40 MG tablet  08/08/19   [provider]  rosuvastatin (CRESTOR) 40 MG tablet TAKE 1 TABLET BY MOUTH ONCE DAILY 07/11/19   Alycia Rossetti, MD     Allergies:     Allergies  Allergen Reactions  . Codeine Nausea And Vomiting  . Tramadol      Physical Exam:   Vitals  Blood pressure (!) 174/83, pulse 62, temperature 98.2 F (36.8 C), temperature source Oral, resp. rate 13, height 5\' 6"  (1.676 m), weight 92.5 kg, SpO2 96 %.   1. General developed female, laying in bed, no apparent distress  2. Normal affect and insight, Not Suicidal or Homicidal, Awake Alert, Oriented X 3.  3. No F.N deficits, ALL C.Nerves Intact, Strength 5/5 all 4 extremities, Sensation intact all 4 extremities, Plantars down  going.  4. Ears and Eyes appear Normal, Conjunctivae clear, PERRLA. Moist Oral Mucosa.  5. Supple Neck, No JVD, No  cervical lymphadenopathy appriciated, No Carotid Bruits.  6. Symmetrical Chest wall movement, Good air movement bilaterally, CTAB.  7. RRR, No Gallops, Rubs or Murmurs, No Parasternal Heave.  8. Positive Bowel Sounds, Abdomen Soft, No tenderness, No organomegaly appriciated,No rebound -guarding or rigidity.  9.  No Cyanosis, Normal Skin Turgor, No Skin Rash or Bruise.  10. Good muscle tone,  joints appear normal , no effusions, Normal ROM.  Arm swelling/lymphedema    Data Review:    CBC Recent Labs  Lab 04/04/20 1339  WBC 7.4  HGB 11.6*  HCT 36.4  PLT 167  MCV 83.9  MCH 26.7  MCHC 31.9  RDW 16.0*  LYMPHSABS 0.9  MONOABS 0.5  EOSABS 0.2  BASOSABS 0.0   ------------------------------------------------------------------------------------------------------------------  Chemistries  Recent Labs  Lab 04/04/20 1339  NA 140  K 3.4*  CL 105  CO2 24  GLUCOSE 101*  BUN 23  CREATININE 1.19*  CALCIUM 9.6  AST 29  ALT 14  ALKPHOS 100  BILITOT 0.9   ------------------------------------------------------------------------------------------------------------------ estimated creatinine clearance is 41.8 mL/min (A) (by C-G formula based on SCr of 1.19 mg/dL (H)). ------------------------------------------------------------------------------------------------------------------ No results for input(s): TSH, T4TOTAL, T3FREE, THYROIDAB in the last 72 hours.  Invalid input(s): FREET3  Coagulation profile No results for input(s): INR, PROTIME in the last 168 hours. ------------------------------------------------------------------------------------------------------------------- No results for input(s): DDIMER in the last 72 hours. -------------------------------------------------------------------------------------------------------------------  Cardiac  Enzymes No results for input(s): CKMB, TROPONINI, MYOGLOBIN in the last 168 hours.  Invalid input(s): CK ------------------------------------------------------------------------------------------------------------------    Component Value Date/Time   BNP 92.0 04/04/2020 1339     ---------------------------------------------------------------------------------------------------------------  Urinalysis    Component Value Date/Time   COLORURINE STRAW (A) 06/28/2019 0927   APPEARANCEUR CLEAR 06/28/2019 0927   LABSPEC 1.008 06/28/2019 0927   PHURINE 7.0 06/28/2019 0927   GLUCOSEU NEGATIVE 06/28/2019 0927   HGBUR SMALL (A) 06/28/2019 0927   BILIRUBINUR NEGATIVE 06/28/2019 0927   BILIRUBINUR neg 04/19/2012 0856   KETONESUR NEGATIVE 06/28/2019 0927   PROTEINUR NEGATIVE 06/28/2019 0927   UROBILINOGEN 0.2 04/19/2012 0856   NITRITE NEGATIVE 06/28/2019 0927   LEUKOCYTESUR TRACE (A) 06/28/2019 0927    ----------------------------------------------------------------------------------------------------------------   Imaging Results:    CT Angio Chest PE W and/or Wo Contrast  Result Date: 04/04/2020 CLINICAL DATA:  80 year old female with shortness of breath for the past 2 weeks, worsening over the past 2-3 days. Evaluate for pulmonary embolism. History of breast cancer status post bilateral mastectomy in 2007. EXAM: CT ANGIOGRAPHY CHEST WITH CONTRAST TECHNIQUE: Multidetector CT imaging of the chest was performed using the standard protocol during bolus administration of intravenous contrast. Multiplanar CT image reconstructions and MIPs were obtained to evaluate the vascular anatomy. CONTRAST:  76mL OMNIPAQUE IOHEXOL 350 MG/ML SOLN COMPARISON:  No priors. FINDINGS: Comment: Today's study is limited by suboptimal contrast bolus and extends patient respiratory motion. Cardiovascular: Within the limitations of today's examination, there is no central, lobar or proximal segmental sized pulmonary  embolism. Distal segmental and subsegmental sized pulmonary embolism cannot be entirely excluded on the basis of today's limited examination. Heart size is mildly enlarged. There is no significant pericardial fluid, thickening or pericardial calcification. There is aortic atherosclerosis, as well as atherosclerosis of the great vessels of the mediastinum and the coronary arteries, including calcified atherosclerotic plaque in the left main, left anterior descending, left circumflex and right coronary arteries. Mediastinum/Nodes: No pathologically enlarged mediastinal or hilar lymph nodes. Esophagus is unremarkable in appearance. No axillary lymphadenopathy. Lungs/Pleura: Patchy areas of mild ground-glass attenuation and interlobular  septal thickening are noted throughout the lungs bilaterally. No confluent consolidative airspace disease. No pleural effusions. No definite suspicious appearing pulmonary nodules or masses are noted. Upper Abdomen: Aortic atherosclerosis. Musculoskeletal: In the left chest wall there is an area of soft tissue architectural distortion measuring 4.5 x 3.1 cm (axial image 68 of series 4), concerning for tumor recurrence in this patient status post bilateral modified radical mastectomy for breast cancer. Multiple lytic lesions are noted in the visualized skeleton, most evident in the T1 and T3 vertebral bodies, as well as the manubrium, concerning for potential metastatic lesions. Review of the MIP images confirms the above findings. IMPRESSION: 1. Limited study demonstrating no central, lobar, or proximal segmental sized pulmonary embolism. Smaller distal segmental and subsegmental sized emboli cannot be entirely excluded. 2. Mild cardiomegaly with evidence of mild interstitial pulmonary edema in the lungs; imaging findings suggestive of congestive heart failure. 3. Irregular masslike area in the left chest wall concerning for potential recurrent breast neoplasm measuring approximately 4.5  x 3.1 cm. There also lytic lesions in T1 and T3 vertebral bodies as well as the manubrium. The possibility of metastatic disease should be considered. Further evaluation with PET-CT should be considered. 4. Aortic atherosclerosis, in addition to left main and 3 vessel coronary artery disease. Aortic Atherosclerosis (ICD10-I70.0). Electronically Signed   By: Vinnie Langton M.D.   On: 04/04/2020 17:47   US Venous Img Upper Left (DVT Study)  Addendum Date: 04/04/2020   ADDENDUM REPORT: 04/04/2020 14:45 ADDENDUM: These results were called by telephone at the time of interpretation on 04/04/2020 at 2:18 pm to provider GARRETT GREEN , who verbally acknowledged these results. Electronically Signed   By: Markus Daft M.D.   On: 04/04/2020 14:45   Result Date: 04/04/2020 CLINICAL DATA:  80 year old with left arm swelling. History of breast cancer. EXAM: LEFT UPPER EXTREMITY VENOUS DOPPLER ULTRASOUND TECHNIQUE: Gray-scale sonography with graded compression, as well as color Doppler and duplex ultrasound were performed to evaluate the upper extremity deep venous system from the level of the subclavian vein and including the jugular, axillary, basilic, radial, ulnar and upper cephalic vein. Spectral Doppler was utilized to evaluate flow at rest and with distal augmentation maneuvers. COMPARISON:  None. FINDINGS: Contralateral Subclavian Vein:  Patent with color Doppler flow. Internal Jugular Vein: Positive for thrombus. Echogenic thrombus in the left internal jugular vein. This appears to represent occlusive thrombus. Subclavian Vein: Positive for thrombus. Evidence for echogenic occlusive thrombus in the left subclavian vein. Axillary Vein: Positive for thrombus. Expansion of left axillary vein with echogenic occlusive thrombus. Cephalic Vein: No evidence of thrombus. Normal compressibility and color Doppler flow. Basilic Vein: Positive for thrombus. Left basilic vein is non compressible and no significant flow. Brachial  Veins: Positive for thrombus involving at least one left brachial vein. This is compatible with occlusive thrombus. Radial Veins: No evidence of thrombus.  Normal compressibility. Ulnar Veins: No evidence of thrombus. Normal compressibility. Limited evaluation. Subcutaneous edema in this area. Other Findings:  Subcutaneous edema near the wrist. IMPRESSION: Positive for deep venous thrombosis in the left upper extremity. Occlusive thrombus involving the left internal jugular vein, left subclavian vein, left axillary vein, left brachial vein and left basilic vein. Electronically Signed: By: Markus Daft M.D. On: 04/04/2020 14:12  KG normal sinus rhythm heart rate of 63, and QTc of 428 Sinus rhythm Atrial premature complex Borderline left axis deviation Borderline T wave abnormalities  Assessment & Plan:    Active Problems:   Coronary atherosclerosis of  native coronary artery   Essential hypertension, benign   Mixed hyperlipidemia   Carotid artery disease ([facility])   Hypothyroidism   Acute DVT (deep venous thrombosis) ([facility])    Left upper extremity acute DVT -Patient appears to be with extensive DVT, most likely provoked by her active malignancy. -PE on imaging. -We will continue with heparin GTT overnight and transition to p.o. apixaban in a.m..  Recurrent metastatic breast cancer -Her breast cancer was in remission for last 8 years, patient and family interested to follow with local oncologist here in [place], so oncology consult request has been placed for patient to establish care before discharge.  History of CAD -Given she is started on Eliquis, and with no recent stent I will stop Plavix and continue with aspirin. -She denies any chest pain.  Troponins mildly trending up, EKG nonacute, will continue to trend, continue with aspirin, she is already heparinized. -Continue with aspirin, statin beta-blockers and ACE.  Hypertension, uncontrolled -Resume home medications, and will add as  needed hydralazine  Hypothyroidism -Continue with home dose Synthroid  Hypokalemia -repleted, recheck in am.  DVT Prophylaxis Heparin gtt  AM Labs Ordered, also please review Full Orders  Family Communication: Admission, patients condition and plan of care including tests being ordered have been discussed with the patient and daughter at bedside who indicate understanding and agree with the plan and Code Status.  Code StatusFull  Likely DC to  home  Condition GUARDED    Consults called: none    Admission status: observation    Time spent in minutes : 60 minutes   Phillips Climes M.D on 04/04/2020 at 7:23 PM   [facility] - Office  [phone]

## 2020-04-04 NOTE — Progress Notes (Signed)
Endocrinology follow-up note   Subjective:    Patient ID: Robin [name], female    DOB: November 19, 1940, PCP [name], MD Patient presents today for follow-up of hypothyroidism.  Has been taking levothyroxine 100 mcg p.o. daily before breakfast.  Past Medical History:  Diagnosis Date  . Breast cancer ([place])    Tamoxifen started 2007  . CAD (coronary artery disease)    DES second diagonal 2/12  . Cardiomyopathy    Probable Takotsubo, LVEF 30-35% 2/12  . Carotid artery disease ([place])   . Essential hypertension   . Hepatic steatosis   . Hyperlipidemia   . Hypothyroidism   . NSTEMI (non-ST elevated myocardial infarction) ([place])    2/12  . Pancreas divisum   . Stroke ([place])    (2000) residual left-sided weakness  . Type 2 diabetes mellitus ([place])    Past Surgical History:  Procedure Laterality Date  . ABDOMINAL HYSTERECTOMY    . CAROTID ENDARTERECTOMY     Left  . CHOLECYSTECTOMY    . JOINT REPLACEMENT     Right knee replacement- [facility]. Sebastian [facility]  . MASTECTOMY     Bilateral   Social History   Socioeconomic History  . Marital status: Widowed    Spouse name: Not on file  . Number of children: Not on file  . Years of education: Not on file  . Highest education level: Not on file  Occupational History  . Not on file  Tobacco Use  . Smoking status: Never Smoker  . Smokeless tobacco: Never Used  Vaping Use  . Vaping Use: Never used  Substance and Sexual Activity  . Alcohol use: No    Alcohol/week: 0.0 standard drinks  . Drug use: No  . Sexual activity: Not on file  Other Topics Concern  . Not on file  Social History Narrative  . Not on file   Social Determinants of Health   Financial Resource Strain: Not on file  Food Insecurity: Not on file  Transportation Needs: Not on file  Physical Activity: Not on file  Stress: Not on file  Social Connections: Not on file   Outpatient Encounter Medications as of 04/04/2020  Medication Sig  . amLODipine  (NORVASC) 10 MG tablet TAKE 1 TABLET BY MOUTH ONCE DAILY  . aspirin 81 MG tablet Take 81 mg by mouth daily.  . carvedilol (COREG) 12.5 MG tablet TAKE 1.5 TABLETS BY MOUTH TWICE DAILY  . Cholecalciferol (VITAMIN D3) 125 MCG (5000 UT) CAPS TAKE 1 CAPSULE BY MOUTH ONCE DAILY  . clopidogrel (PLAVIX) 75 MG tablet TAKE 1 TABLET BY MOUTH ONCE DAILY  . docusate sodium (COLACE) 100 MG capsule Take 100 mg by mouth as needed for mild constipation.  . furosemide (LASIX) 20 MG tablet TAKE 1 TABLET BY MOUTH EVERY DAY AS NEEDED AS DIRECTED  . gabapentin (NEURONTIN) 100 MG capsule Take 1 capsule (100 mg total) by mouth 3 (three) times daily.  . hydrALAZINE (APRESOLINE) 25 MG tablet Take 1 tablet (25 mg total) by mouth in the morning and at bedtime.  Marland Kitchen losartan-hydrochlorothiazide (HYZAAR) 50-12.5 MG tablet TAKE 1 TABLET BY MOUTH EVERY DAY  . nitroGLYCERIN (NITROSTAT) 0.4 MG SL tablet Place 1 tablet (0.4 mg total) under the tongue every 5 (five) minutes x 3 doses as needed.  . Omega-3 Fatty Acids 300 MG CAPS Take 1 capsule (300 mg total) by mouth 2 (two) times daily.  . pantoprazole (PROTONIX) 40 MG tablet   . rosuvastatin (CRESTOR) 40 MG tablet  TAKE 1 TABLET BY MOUTH ONCE DAILY  . [DISCONTINUED] levothyroxine (SYNTHROID) 125 MCG tablet Take 1 tablet (125 mcg total) by mouth daily before breakfast. TAKE 1 TABLET BY MOUTH EVERY DAY BEFORE BREAKFAST  . levothyroxine (SYNTHROID) 137 MCG tablet Take 1 tablet (137 mcg total) by mouth daily before breakfast. TAKE 1 TABLET BY MOUTH EVERY DAY BEFORE BREAKFAST   No facility-administered encounter medications on file as of 04/04/2020.   ALLERGIES: Allergies  Allergen Reactions  . Codeine Nausea And Vomiting  . Tramadol     VACCINATION STATUS: Immunization History  Administered Date(s) Administered  . Fluad Quad(high Dose 65+) 10/12/2018, 11/04/2019  . Influenza, High Dose Seasonal PF 11/17/2016, 12/28/2017  . Influenza,inj,Quad PF,6+ Mos 10/31/2014, 11/20/2015   . Moderna Sars-Covid-2 Vaccination 06/10/2019, 07/08/2019  . Pneumococcal Conjugate-13 12/01/2012    Thyroid Problem Presents for follow-up visit. Symptoms include fatigue. Patient reports no anxiety, cold intolerance, constipation, depressed mood, diarrhea, heat intolerance, palpitations, tremors, weight gain or weight loss. The symptoms have been stable.   - 80-yr old female patient with medical history as above. She is being seen in follow-up for hypothyroidism with repeat thyroid function test.   -she is currently on Synthroid 125 mcg p.o. every morning.  She has no complaints today.  - She denies heat/cold intolerance. She denies palpitations nor tremors.   She denies any history of goiter. She has family history of thyroid dysfunction in one of her daughters. She denies any family history of thyroid cancer. -She reports compliance to her thyroid hormone.   Review of systems  Constitutional: + Minimally fluctuating body weight,  current Body mass index is 35.51 kg/m. , + fatigue, no subjective hyperthermia, no subjective hypothermia Eyes: no blurry vision, no xerophthalmia ENT: no sore throat, no nodules palpated in throat, no dysphagia/odynophagia, no hoarseness Cardiovascular: no chest pain, no shortness of breath, no palpitations, no leg swelling Respiratory: no cough, no shortness of breath Gastrointestinal: no nausea/vomiting/diarrhea Musculoskeletal: no muscle/joint aches, walks with cane and assistance from daughter Skin: no rashes, no hyperemia, swelling to left upper extremity (started over weekend) Neurological: no tremors, no numbness, no tingling, no dizziness Psychiatric: no depression, no anxiety  Objective:    BP (!) 207/98 (BP Location: Right Arm, Patient Position: Sitting)   Pulse 70   Ht 5\' 6"  (1.676 m)   Wt 220 lb (99.8 kg)   BMI 35.51 kg/m   Wt Readings from Last 3 Encounters:  04/04/20 220 lb (99.8 kg)  03/29/20 220 lb 6.4 oz (100 kg)  03/21/20 228  lb (103.4 kg)    BP Readings from Last 3 Encounters:  04/04/20 (!) 207/98  03/29/20 (!) 142/84  03/21/20 (!) 227/98   Physical Exam- Limited  Constitutional:  Body mass index is 35.51 kg/m. , not in acute distress, normal state of mind Eyes:  EOMI, no exophthalmos Neck: Supple Cardiovascular: RRR, no murmers, rubs, or gallops, + edema LUE Respiratory: Adequate breathing efforts, no crackles, rales, rhonchi, or wheezing Musculoskeletal: no gross deformities, strength intact in all four extremities, no gross restriction of joint movements, walks with cane and assistance from daughter Skin:  no rashes, no hyperemia Neurological: no tremor with outstretched hands    Recent Results (from the past 2160 hour(s))  CBC with Differential/Platelet     Status: Abnormal   Collection Time: 03/01/20 10:08 AM  Result Value Ref Range   WBC 7.1 3.8 - 10.8 Thousand/uL   RBC 4.71 3.80 - 5.10 Million/uL   Hemoglobin 12.4 11.7 - 15.5  g/dL   HCT 38.4 35.0 - 45.0 %   MCV 81.5 80.0 - 100.0 fL   MCH 26.3 (L) 27.0 - 33.0 pg   MCHC 32.3 32.0 - 36.0 g/dL   RDW 15.3 (H) 11.0 - 15.0 %   Platelets 190 140 - 400 Thousand/uL   MPV 11.0 7.5 - 12.5 fL   Neutro Abs 4,984 1,500 - 7,800 cells/uL   Lymphs Abs 1,349 850 - 3,900 cells/uL   Absolute Monocytes 533 200 - 950 cells/uL   Eosinophils Absolute 213 15 - 500 cells/uL   Basophils Absolute 21 0 - 200 cells/uL   Neutrophils Relative % 70.2 %   Total Lymphocyte 19.0 %   Monocytes Relative 7.5 %   Eosinophils Relative 3.0 %   Basophils Relative 0.3 %  Comprehensive metabolic panel     Status: Abnormal   Collection Time: 03/01/20 10:08 AM  Result Value Ref Range   Glucose, Bld 97 65 - 99 mg/dL    Comment: .            Fasting reference interval .    BUN 16 7 - 25 mg/dL   Creat 1.16 (H) 0.60 - 0.88 mg/dL    Comment: For patients >35 years of age, the reference limit for Creatinine is approximately 13% higher for people identified as  African-American. .    BUN/Creatinine Ratio 14 6 - 22 (calc)   Sodium 141 135 - 146 mmol/L   Potassium 3.7 3.5 - 5.3 mmol/L   Chloride 105 98 - 110 mmol/L   CO2 27 20 - 32 mmol/L   Calcium 10.3 8.6 - 10.4 mg/dL   Total Protein 7.5 6.1 - 8.1 g/dL   Albumin 4.3 3.6 - 5.1 g/dL   Globulin 3.2 1.9 - 3.7 g/dL (calc)   AG Ratio 1.3 1.0 - 2.5 (calc)   Total Bilirubin 0.6 0.2 - 1.2 mg/dL   Alkaline phosphatase (APISO) 105 37 - 153 U/L   AST 20 10 - 35 U/L   ALT 10 6 - 29 U/L  Hemoglobin A1c     Status: Abnormal   Collection Time: 03/01/20 10:08 AM  Result Value Ref Range   Hgb A1c MFr Bld 5.9 (H) <5.7 % of total Hgb    Comment: For someone without known diabetes, a hemoglobin  A1c value between 5.7% and 6.4% is consistent with prediabetes and should be confirmed with a  follow-up test. . For someone with known diabetes, a value <7% indicates that their diabetes is well controlled. A1c targets should be individualized based on duration of diabetes, age, comorbid conditions, and other considerations. . This assay result is consistent with an increased risk of diabetes. . Currently, no consensus exists regarding use of hemoglobin A1c for diagnosis of diabetes for children. .    Mean Plasma Glucose 123 mg/dL   eAG (mmol/L) 6.8 mmol/L  Lipid panel     Status: Abnormal   Collection Time: 03/01/20 10:08 AM  Result Value Ref Range   Cholesterol 145 <200 mg/dL   HDL 46 (L) > OR = 50 mg/dL   Triglycerides 109 <150 mg/dL   LDL Cholesterol (Calc) 80 mg/dL (calc)    Comment: Reference range: <100 . Desirable range <100 mg/dL for primary prevention;   <70 mg/dL for patients with CHD or diabetic patients  with > or = 2 CHD risk factors. Marland Kitchen LDL-C is now calculated using the Martin-Hopkins  calculation, which is a validated novel method providing  better accuracy than the Friedewald equation  in the  estimation of LDL-C.  Cresenciano Genre et al. Annamaria Helling. 7939;030(09): 2061-2068   (http://education.[facility].com/faq/FAQ164)    Total CHOL/HDL Ratio 3.2 <5.0 (calc)   Non-HDL Cholesterol (Calc) 99 <130 mg/dL (calc)    Comment: For patients with diabetes plus 1 major ASCVD risk  factor, treating to a non-HDL-C goal of <100 mg/dL  (LDL-C of <70 mg/dL) is considered a therapeutic  option.   TSH     Status: Abnormal   Collection Time: 03/29/20  2:16 PM  Result Value Ref Range   TSH 10.600 (H) 0.450 - 4.500 uIU/mL  T4, free     Status: None   Collection Time: 03/29/20  2:16 PM  Result Value Ref Range   Free T4 0.90 0.82 - 1.77 ng/dL      Diabetic Labs (most recent): Lab Results  Component Value Date   HGBA1C 5.9 (H) 03/01/2020   HGBA1C 5.8 (H) 11/04/2019   HGBA1C 5.8 (H) 05/18/2019   Results for MANHA, AMATO (MRN [id number]) as of 04/04/2020 11:12  Ref. Range 12/22/2019 08:31 03/01/2020 09:04 03/01/2020 10:08 03/13/2020 07:50 03/29/2020 14:16  TSH Latest Ref Range: 0.450 - 4.500 uIU/mL 16.000 (H)    10.600 (H)  T4,Free(Direct) Latest Ref Range: 0.82 - 1.77 ng/dL 0.73 (L)    0.90      Assessment & Plan:   1. Hypothyroidism -Her previsit TFTs are consistent with slight under-replacement yet have improved.  She is advised to increase Levothyroxine to 137 mcg po daily before breakfast.   - We discussed about correct intake of levothyroxine, at fasting, with water, separated by at least 30 minutes from breakfast, and separated by more than 4 hours from calcium, iron, multivitamins, acid reflux medications (PPIs). -Patient is made aware of the fact that thyroid hormone replacement is needed for life, dose to be adjusted by periodic monitoring of thyroid function tests.    2. HTN Her blood pressure today is 207/98, above target.  She reports she took some of her BP medication but did not take the fluid pill due to the urinary frequency and urgency it causes. This has happened on other visits as well.  I advised her to discuss with her PCP urgently as she  is at great risk of cardiovascular events.       - Time spent on this patient care encounter:  30 minutes of which 50% was spent in  counseling and the rest reviewing  her current and  previous labs / studies and medications  doses and developing a plan for long term care, and documenting this care. Robin [name]  participated in the discussions, expressed understanding, and voiced agreement with the above plans.  All questions were answered to her satisfaction. she is encouraged to contact clinic should she have any questions or concerns prior to her return visit.  Follow up plan: Return in about 3 months (around 07/05/2020) for Thyroid follow up, Previsit labs.  Rayetta Pigg, [facility] [address] [place], [place] [address] Phone: [phone] Fax: [phone]   04/04/2020, 11:14 AM

## 2020-04-04 NOTE — Patient Instructions (Signed)

## 2020-04-04 NOTE — ED Provider Notes (Signed)
[facility] EMERGENCY DEPARTMENT Provider Note   CSN: [id number] Arrival date & time: 04/04/20  1145     History Chief Complaint  Patient presents with  . Hypertension    Robin [name] is a 80 y.o. female with past medical history of HLD, HTN on several medications, s/p left-sided mastectomy for breast cancer, CAD with NSTEMI, CVA on Plavix, and thyroid disorder on Synthroid presents the ED sent from PCP for elevated blood pressures and left arm swelling.  I reviewed patient's medical record and she was noted to be 207/98 at her office encounter today.  She had been similarly elevated to 227/98 at an office visit on 03/21/2020 and 230/112 on 03/01/2020.  Her markedly elevated blood pressures have been persistent for at least several months and noted during several encounters.  Patient is followed by [name] at [facility] and was last evaluated on 11/11/2019.  On my examination, patient reports that she went to her endocrinologist today and they advised her to follow-up with her primary care provider given elevated blood pressures.  However, the primary care provider was not available today so they came to the ED for evaluation.  Patient also notes that she has experienced left arm swelling for the past week, most notably on the dorsum of the hand.  She also endorses dyspnea, particularly with exertion.  She has not seen a cardiologist in a while.  She plans to call [name].  Patient is accompanied by her daughter was at bedside.  She denies any recent fevers, chest pain, orthopnea, cough, abdominal pain, nausea or vomiting, or other symptoms.  She is taking all of her medications including her Plavix, as directed.    On my initial examination, patient's blood pressure is actually relatively well controlled for her at 169/108, but it has been as high as 222/124 here in the ED.  HPI     Past Medical History:  Diagnosis Date  . Breast cancer ([place])    Tamoxifen started 2007   . CAD (coronary artery disease)    DES second diagonal 2/12  . Cardiomyopathy    Probable Takotsubo, LVEF 30-35% 2/12  . Carotid artery disease ([place])   . Essential hypertension   . Hepatic steatosis   . Hyperlipidemia   . Hypothyroidism   . NSTEMI (non-ST elevated myocardial infarction) ([place])    2/12  . Pancreas divisum   . Stroke ([place])    (2000) residual left-sided weakness  . Type 2 diabetes mellitus [facility])     Patient Active Problem List   Diagnosis Date Noted  . Acute DVT (deep venous thrombosis) ([place]) 04/04/2020  . Encounter to establish care 03/29/2020  . Myalgia and myositis 11/24/2019  . Urinary incontinence 11/24/2019  . Vitamin D deficiency 10/22/2017  . Constipation 11/17/2016  . Prediabetes 07/19/2012  . Tinnitus 12/04/2011  . Hypothyroidism 09/19/2011  . OA (osteoarthritis) of hip 09/19/2011  . Obesity 09/19/2011  . Carotid artery disease ([place]) 04/24/2011  . Peripheral edema 09/17/2010  . Secondary cardiomyopathy ([place]) 04/09/2010  . Coronary atherosclerosis of native coronary artery 04/09/2010  . Essential hypertension, benign 04/09/2010  . Mixed hyperlipidemia 04/09/2010    Past Surgical History:  Procedure Laterality Date  . ABDOMINAL HYSTERECTOMY    . CAROTID ENDARTERECTOMY     Left  . CHOLECYSTECTOMY    . JOINT REPLACEMENT     Right knee [facility]- [facility]. [facility]  . MASTECTOMY     Bilateral     OB History  Gravida  6   Para  6   Term  6   Preterm      AB      Living  6     SAB      IAB      Ectopic      Multiple      Live Births              Family History  Problem Relation Age of Onset  . Heart attack Sister   . Heart disease Sister   . Heart attack Brother   . Heart disease Brother     Social History   Tobacco Use  . Smoking status: Never Smoker  . Smokeless tobacco: Never Used  Vaping Use  . Vaping Use: Never used  Substance Use Topics  . Alcohol use: No    Alcohol/week: 0.0 standard  drinks  . Drug use: No    Home Medications Prior to Admission medications   Medication Sig Start Date End Date Taking? Authorizing Provider  amLODipine (NORVASC) 10 MG tablet TAKE 1 TABLET BY MOUTH ONCE DAILY 05/21/18   Alycia Rossetti, MD  aspirin 81 MG tablet Take 81 mg by mouth daily.    [provider]  carvedilol (COREG) 12.5 MG tablet TAKE 1.5 TABLETS BY MOUTH TWICE DAILY 09/08/19   Alycia Rossetti, MD  Cholecalciferol (VITAMIN D3) 125 MCG (5000 UT) CAPS TAKE 1 CAPSULE BY MOUTH ONCE DAILY 01/14/18   Cassandria Anger, MD  clopidogrel (PLAVIX) 75 MG tablet TAKE 1 TABLET BY MOUTH ONCE DAILY 01/25/20   Alycia Rossetti, MD  docusate sodium (COLACE) 100 MG capsule Take 100 mg by mouth as needed for mild constipation.    [provider]  furosemide (LASIX) 20 MG tablet TAKE 1 TABLET BY MOUTH EVERY DAY AS NEEDED AS DIRECTED 02/03/20   Alycia Rossetti, MD  gabapentin (NEURONTIN) 100 MG capsule Take 1 capsule (100 mg total) by mouth 3 (three) times daily. 03/01/20   Carole Civil, MD  hydrALAZINE (APRESOLINE) 25 MG tablet Take 1 tablet (25 mg total) by mouth in the morning and at bedtime. 11/11/19 02/09/20  Verta Ellen., NP  levothyroxine (SYNTHROID) 137 MCG tablet Take 1 tablet (137 mcg total) by mouth daily before breakfast. TAKE 1 TABLET BY MOUTH EVERY DAY BEFORE BREAKFAST 04/04/20   Brita Romp, NP  losartan-hydrochlorothiazide (HYZAAR) 50-12.5 MG tablet TAKE 1 TABLET BY MOUTH EVERY DAY 09/12/19   [place], Modena Nunnery, MD  nitroGLYCERIN (NITROSTAT) 0.4 MG SL tablet Place 1 tablet (0.4 mg total) under the tongue every 5 (five) minutes x 3 doses as needed. 01/11/14   Satira Sark, MD  Omega-3 Fatty Acids 300 MG CAPS Take 1 capsule (300 mg total) by mouth 2 (two) times daily. 04/24/11   Serpe, Burna Forts, PA-C  pantoprazole (PROTONIX) 40 MG tablet  08/08/19   [provider]  rosuvastatin (CRESTOR) 40 MG tablet TAKE 1 TABLET BY MOUTH ONCE DAILY  07/11/19   Alycia Rossetti, MD    Allergies    Codeine and Tramadol  Review of Systems   Review of Systems  All other systems reviewed and are negative.   Physical Exam Updated Vital Signs BP (!) 175/91   Pulse 63   Temp 98.2 F (36.8 C) (Oral)   Resp 17   Ht 5\' 6"  (1.676 m)   Wt 92.5 kg   SpO2 97%   BMI 32.93 kg/m   Physical Exam  Vitals and nursing note reviewed. Exam conducted with a chaperone present.  Constitutional:      General: She is not in acute distress.    Appearance: Normal appearance. She is not toxic-appearing.  HENT:     Head: Normocephalic and atraumatic.  Eyes:     General: No scleral icterus.    Conjunctiva/sclera: Conjunctivae normal.  Cardiovascular:     Rate and Rhythm: Normal rate.     Pulses: Normal pulses.  Pulmonary:     Effort: Pulmonary effort is normal. No respiratory distress.  Musculoskeletal:        General: Swelling present.     Comments: Left arm: Mild swelling relative to contralateral arm, most notably involving dorsum of hand.  Nonpitting edema.  ROM and strength mildly limited due to prior stroke.  No obvious joint effusions.  No redness or other overlying skin changes.  No induration.  Nontender to palpation.  Radial pulse intact and symmetric with contralateral arm.  Sensation intact throughout.  Skin:    General: Skin is dry.  Neurological:     Mental Status: She is alert.     GCS: GCS eye subscore is 4. GCS verbal subscore is 5. GCS motor subscore is 6.  Psychiatric:        Mood and Affect: Mood normal.        Behavior: Behavior normal.        Thought Content: Thought content normal.     ED Results / Procedures / Treatments   Labs (all labs ordered are listed, but only abnormal results are displayed) Labs Reviewed  CBC WITH DIFFERENTIAL/PLATELET - Abnormal; Notable for the following components:      Result Value   Hemoglobin 11.6 (*)    RDW 16.0 (*)    All other components within normal limits  COMPREHENSIVE  METABOLIC PANEL - Abnormal; Notable for the following components:   Potassium 3.4 (*)    Glucose, Bld 101 (*)    Creatinine, Ser 1.19 (*)    GFR, Estimated 46 (*)    All other components within normal limits  TROPONIN I (HIGH SENSITIVITY) - Abnormal; Notable for the following components:   Troponin I (High Sensitivity) 36 (*)    All other components within normal limits  TROPONIN I (HIGH SENSITIVITY) - Abnormal; Notable for the following components:   Troponin I (High Sensitivity) 64 (*)    All other components within normal limits  SARS CORONAVIRUS 2 (TAT 6-24 HRS)  BRAIN NATRIURETIC PEPTIDE  HEPARIN LEVEL (UNFRACTIONATED)  CBC    EKG EKG Interpretation  Date/Time:  Wednesday April 04 2020 13:02:00 EDT Ventricular Rate:  63 PR Interval:    QRS Duration: 98 QT Interval:  418 QTC Calculation: 428 R Axis:   -23 Text Interpretation: Sinus rhythm Atrial premature complex Borderline left axis deviation Borderline T wave abnormalities Confirmed by Thamas Jaegers ([id number]) on 04/04/2020 2:43:13 PM   Radiology CT Angio Chest PE W and/or Wo Contrast  Result Date: 04/04/2020 CLINICAL DATA:  80 year old female with shortness of breath for the past 2 weeks, worsening over the past 2-3 days. Evaluate for pulmonary embolism. History of breast cancer status post bilateral mastectomy in 2007. EXAM: CT ANGIOGRAPHY CHEST WITH CONTRAST TECHNIQUE: Multidetector CT imaging of the chest was performed using the standard protocol during bolus administration of intravenous contrast. Multiplanar CT image reconstructions and MIPs were obtained to evaluate the vascular anatomy. CONTRAST:  21mL OMNIPAQUE IOHEXOL 350 MG/ML SOLN COMPARISON:  No priors. FINDINGS: Comment: Today's study is limited  by suboptimal contrast bolus and extends patient respiratory motion. Cardiovascular: Within the limitations of today's examination, there is no central, lobar or proximal segmental sized pulmonary embolism. Distal segmental and  subsegmental sized pulmonary embolism cannot be entirely excluded on the basis of today's limited examination. Heart size is mildly enlarged. There is no significant pericardial fluid, thickening or pericardial calcification. There is aortic atherosclerosis, as well as atherosclerosis of the great vessels of the mediastinum and the coronary arteries, including calcified atherosclerotic plaque in the left main, left anterior descending, left circumflex and right coronary arteries. Mediastinum/Nodes: No pathologically enlarged mediastinal or hilar lymph nodes. Esophagus is unremarkable in appearance. No axillary lymphadenopathy. Lungs/Pleura: Patchy areas of mild ground-glass attenuation and interlobular septal thickening are noted throughout the lungs bilaterally. No confluent consolidative airspace disease. No pleural effusions. No definite suspicious appearing pulmonary nodules or masses are noted. Upper Abdomen: Aortic atherosclerosis. Musculoskeletal: In the left chest wall there is an area of soft tissue architectural distortion measuring 4.5 x 3.1 cm (axial image 68 of series 4), concerning for tumor recurrence in this patient status post bilateral modified radical mastectomy for breast cancer. Multiple lytic lesions are noted in the visualized skeleton, most evident in the T1 and T3 vertebral bodies, as well as the manubrium, concerning for potential metastatic lesions. Review of the MIP images confirms the above findings. IMPRESSION: 1. Limited study demonstrating no central, lobar, or proximal segmental sized pulmonary embolism. Smaller distal segmental and subsegmental sized emboli cannot be entirely excluded. 2. Mild cardiomegaly with evidence of mild interstitial pulmonary edema in the lungs; imaging findings suggestive of congestive heart failure. 3. Irregular masslike area in the left chest wall concerning for potential recurrent breast neoplasm measuring approximately 4.5 x 3.1 cm. There also lytic  lesions in T1 and T3 vertebral bodies as well as the manubrium. The possibility of metastatic disease should be considered. Further evaluation with PET-CT should be considered. 4. Aortic atherosclerosis, in addition to left main and 3 vessel coronary artery disease. Aortic Atherosclerosis (ICD10-I70.0). Electronically Signed   By: Vinnie Langton M.D.   On: 04/04/2020 17:47   US Venous Img Upper Left (DVT Study)  Addendum Date: 04/04/2020   ADDENDUM REPORT: 04/04/2020 14:45 ADDENDUM: These results were called by telephone at the time of interpretation on 04/04/2020 at 2:18 pm to provider Louvina Cleary , who verbally acknowledged these results. Electronically Signed   By: Markus Daft M.D.   On: 04/04/2020 14:45   Result Date: 04/04/2020 CLINICAL DATA:  80 year old with left arm swelling. History of breast cancer. EXAM: LEFT UPPER EXTREMITY VENOUS DOPPLER ULTRASOUND TECHNIQUE: Gray-scale sonography with graded compression, as well as color Doppler and duplex ultrasound were performed to evaluate the upper extremity deep venous system from the level of the subclavian vein and including the jugular, axillary, basilic, radial, ulnar and upper cephalic vein. Spectral Doppler was utilized to evaluate flow at rest and with distal augmentation maneuvers. COMPARISON:  None. FINDINGS: Contralateral Subclavian Vein:  Patent with color Doppler flow. Internal Jugular Vein: Positive for thrombus. Echogenic thrombus in the left internal jugular vein. This appears to represent occlusive thrombus. Subclavian Vein: Positive for thrombus. Evidence for echogenic occlusive thrombus in the left subclavian vein. Axillary Vein: Positive for thrombus. Expansion of left axillary vein with echogenic occlusive thrombus. Cephalic Vein: No evidence of thrombus. Normal compressibility and color Doppler flow. Basilic Vein: Positive for thrombus. Left basilic vein is non compressible and no significant flow. Brachial Veins: Positive for  thrombus involving at least one  left brachial vein. This is compatible with occlusive thrombus. Radial Veins: No evidence of thrombus.  Normal compressibility. Ulnar Veins: No evidence of thrombus. Normal compressibility. Limited evaluation. Subcutaneous edema in this area. Other Findings:  Subcutaneous edema near the wrist. IMPRESSION: Positive for deep venous thrombosis in the left upper extremity. Occlusive thrombus involving the left internal jugular vein, left subclavian vein, left axillary vein, left brachial vein and left basilic vein. Electronically Signed: By: Markus Daft M.D. On: 04/04/2020 14:12    Procedures .Critical Care Performed by: Corena Herter, PA-C Authorized by: Corena Herter, PA-C   Critical care provider statement:    Critical care time (minutes):  60   Critical care was necessary to treat or prevent imminent or life-threatening deterioration of the following conditions:  Circulatory failure   Critical care was time spent personally by me on the following activities:  Discussions with consultants, evaluation of patient's response to treatment, examination of patient, ordering and performing treatments and interventions, ordering and review of laboratory studies, ordering and review of radiographic studies, pulse oximetry, re-evaluation of patient's condition, obtaining history from patient or surrogate and review of old charts Comments:     Elevated troponins. Large upper extremity DVT. Findings concerning for new metastatic cancer.     Medications Ordered in ED Medications  heparin bolus via infusion 4,800 Units (4,800 Units Intravenous Bolus from Bag 04/04/20 1532)    Followed by  heparin ADULT infusion 100 units/mL (25000 units/270mL) (1,300 Units/hr Intravenous New Bag/Given 04/04/20 1532)  iohexol (OMNIPAQUE) 300 MG/ML solution 75 mL (has no administration in time range)  carvedilol (COREG) tablet 12.5 mg (has no administration in time range)  iohexol  (OMNIPAQUE) 350 MG/ML injection 75 mL (75 mLs Intravenous Contrast Given 04/04/20 1656)    ED Course  I have reviewed the triage vital signs and the nursing notes.  Pertinent labs & imaging results that were available during my care of the patient were reviewed by me and considered in my medical decision making (see chart for details).  Clinical Course as of 04/04/20 Robin [name] Apr 04, 2020  1834 I spoke with Dr. Waldron Labs who will see and admit patient. [GG]    Clinical Course User Index [GG] Corena Herter, PA-C   MDM Rules/Calculators/A&P                          JILLYN STACEY was evaluated in Emergency Department on 04/04/2020 for the symptoms described in the history of present illness. She was evaluated in the context of the global COVID-19 pandemic, which necessitated consideration that the patient might be at risk for infection with the SARS-CoV-2 virus that causes COVID-19. Institutional protocols and algorithms that pertain to the evaluation of patients at risk for COVID-19 are in a state of rapid change based on information released by regulatory bodies including the CDC and federal and state organizations. These policies and algorithms were followed during the patient's care in the ED.  I personally reviewed patient's medical chart and all notes from triage and staff during today's encounter. I have also ordered and reviewed all labs and imaging that I felt to be medically necessary in the evaluation of this patient's complaints and with consideration of their physical exam. If needed, translation services were available and utilized.   Given patient's 2-week history of exertional dyspnea, will obtain work-up including chest x-ray and BNP to evaluate for new onset CHF.  She denies any chest  pain, even with exertion.  No symptoms at present.  She states that she feels fine and at baseline, but simply came here given that her primary care provider was unable to see her today.  Given  patient's left upper arm swelling, will obtain DVT study despite taking her Plavix, as directed.  Left upper extremity DVT study is positive for occlusive thrombus involving the left internal jugular vein, left subclavian vein, left axillary vein, left brachial vein, and left basilic vein.  Will proceed with CTA chest.  Patient no longer sees an oncologist and instead states that she was cleared of their services years ago.    Troponin is mildly elevated at 36.  Concern for pulmonary embolism and right heart strain.  CTA still pending.     CTA is reviewed and without any obvious pulmonary emboli, but limited study and distal segmental and subsegmental sized emboli cannot be entirely excluded.  There is mild cardiomegaly with evidence of pulmonary edema suggestive of CHF, however her BNP is within normal limits.  There is also an irregular masslike area in the left chest wall concerning for potential recurrent breast neoplasm measuring 4 x 3 cm as well as lytic lesions involving T1 and T3.  Recommending further evaluation with PET/CT.  Troponin trended from 36 >> 64.  No large pulmonary embolus.  She has already been started on heparin.  Will consult hospitalist as she is chest-pain free.    I spoke with Dr. Waldron Labs who will see and admit patient.   Final Clinical Impression(s) / ED Diagnoses Final diagnoses:  Acute deep vein thrombosis (DVT) of left upper extremity, unspecified vein (HCC)  Elevated troponin    Rx / DC Orders ED Discharge Orders    None       Reita Chard 04/04/20 1835    Luna Fuse, MD 04/11/20 [phone]

## 2020-04-04 NOTE — Progress Notes (Signed)
ANTICOAGULATION CONSULT NOTE - Initial Consult  Pharmacy Consult for Heparin Indication: VTE treatment  Allergies  Allergen Reactions  . Codeine Nausea And Vomiting  . Tramadol     Patient Measurements: Height: 5\' 6"  (167.6 cm) Weight: 92.5 kg (204 lb) IBW/kg (Calculated) : 59.3 HEPARIN DW (KG): 79.6  Vital Signs: Temp: 98.2 F (36.8 C) (03/16 1200) Temp Source: Oral (03/16 1200) BP: 209/127 (03/16 1450) Pulse Rate: 71 (03/16 1450)  Labs: Recent Labs    04/04/20 1339  HGB 11.6*  HCT 36.4  PLT 167  CREATININE 1.19*    Estimated Creatinine Clearance: 41.8 mL/min (A) (by C-G formula based on SCr of 1.19 mg/dL (H)).   Medical History: Past Medical History:  Diagnosis Date  . Breast cancer ([place])    Tamoxifen started 2007  . CAD (coronary artery disease)    DES second diagonal 2/12  . Cardiomyopathy    Probable Takotsubo, LVEF 30-35% 2/12  . Carotid artery disease ([place])   . Essential hypertension   . Hepatic steatosis   . Hyperlipidemia   . Hypothyroidism   . NSTEMI (non-ST elevated myocardial infarction) ([place])    2/12  . Pancreas divisum   . Stroke ([place])    (2000) residual left-sided weakness  . Type 2 diabetes mellitus (HCC)     Medications:  See med rec  Assessment: Patient presented to ED with left arm swelling and HTN. Left upper extremity DVT study is positive for occlusive thrombus involving the left internal jugular vein, left subclavian vein, left axillary vein, left brachial vein, and left basilic vein.  Will proceed with CTA chest. Patient is not on oral anticoagulants at home. Pharmacy ask to start heparin  Goal of Therapy:  Heparin level 0.3-0.7 units/ml Monitor platelets by anticoagulation protocol: Yes   Plan:  Give 4800 units bolus x 1 Start heparin infusion at 1300 units/hr Check anti-Xa level in ~8 hours and daily while on heparin Continue to monitor H&H and platelets  Isac Sarna, BS Vena Austria, BCPS Clinical Pharmacist Pager  [phone] 04/04/2020,3:03 PM

## 2020-04-04 NOTE — ED Triage Notes (Signed)
Pt to er, pt states that she is here today for some L arm swelling and htn, states that she went to her pmd and discovered that she had an elevated blood pressure, states that she doesn't have a headache or weakness, states that she only has some L arm swelling, states that she has a hx of mastectomy on her L side denies hx of lymphedema.

## 2020-04-04 NOTE — Progress Notes (Signed)
[place] for Heparin Indication: VTE treatment  Allergies  Allergen Reactions  . Codeine Nausea And Vomiting  . Tramadol     Patient Measurements: Height: 5\' 6"  (167.6 cm) Weight: 92.5 kg (204 lb) IBW/kg (Calculated) : 59.3 HEPARIN DW (KG): 79.6  Vital Signs: Temp: 98.7 F (37.1 C) (03/16 2001) Temp Source: Oral (03/16 2001) BP: 192/64 (03/16 2001) Pulse Rate: 61 (03/16 2001)  Labs: Recent Labs    04/04/20 1339 04/04/20 1444 04/04/20 1629 04/04/20 2208  HGB 11.6*  --   --   --   HCT 36.4  --   --   --   PLT 167  --   --   --   HEPARINUNFRC  --   --   --  0.90*  CREATININE 1.19*  --   --   --   TROPONINIHS  --  36* 64*  --     Estimated Creatinine Clearance: 41.8 mL/min (A) (by C-G formula based on SCr of 1.19 mg/dL (H)).  Assessment: Patient presented to ED with left arm swelling and HTN. Left upper extremity DVT study is positive for occlusive thrombus involving the left internal jugular vein, left subclavian vein, left axillary vein, left brachial vein, and left basilic vein.  Pt started on heparin per pharmacy.  Heparin level supratherapeutic (0.9) on gtt at 1300 units/hr. No bleeding noted.  Goal of Therapy:  Heparin level 0.3-0.7 units/ml Monitor platelets by anticoagulation protocol: Yes   Plan:  Decrease heparin infusion to 1150 units/hr Will f/u 8 hr heparin level  Sherlon Handing, PharmD, BCPS Please see amion for complete clinical pharmacist phone list 04/04/2020,10:47 PM

## 2020-04-05 ENCOUNTER — Observation Stay (HOSPITAL_COMMUNITY): Payer: Medicare PPO

## 2020-04-05 DIAGNOSIS — Z170 Estrogen receptor positive status [ER+]: Secondary | ICD-10-CM

## 2020-04-05 DIAGNOSIS — R222 Localized swelling, mass and lump, trunk: Secondary | ICD-10-CM | POA: Diagnosis not present

## 2020-04-05 DIAGNOSIS — C50912 Malignant neoplasm of unspecified site of left female breast: Secondary | ICD-10-CM | POA: Diagnosis not present

## 2020-04-05 DIAGNOSIS — I10 Essential (primary) hypertension: Secondary | ICD-10-CM | POA: Diagnosis not present

## 2020-04-05 DIAGNOSIS — I779 Disorder of arteries and arterioles, unspecified: Secondary | ICD-10-CM | POA: Diagnosis not present

## 2020-04-05 DIAGNOSIS — E039 Hypothyroidism, unspecified: Secondary | ICD-10-CM

## 2020-04-05 DIAGNOSIS — I82622 Acute embolism and thrombosis of deep veins of left upper extremity: Secondary | ICD-10-CM

## 2020-04-05 LAB — BASIC METABOLIC PANEL
Anion gap: 13 (ref 5–15)
BUN: 21 mg/dL (ref 8–23)
CO2: 23 mmol/L (ref 22–32)
Calcium: 9.9 mg/dL (ref 8.9–10.3)
Chloride: 103 mmol/L (ref 98–111)
Creatinine, Ser: 1.11 mg/dL — ABNORMAL HIGH (ref 0.44–1.00)
GFR, Estimated: 50 mL/min — ABNORMAL LOW (ref >60)
Glucose, Bld: 108 mg/dL — ABNORMAL HIGH (ref 70–99)
Potassium: 3.2 mmol/L — ABNORMAL LOW (ref 3.5–5.1)
Sodium: 139 mmol/L (ref 135–145)

## 2020-04-05 LAB — CBC
HCT: 36.6 % (ref 36.0–46.0)
Hemoglobin: 11.5 g/dL — ABNORMAL LOW (ref 12.0–15.0)
MCH: 26.1 pg (ref 26.0–34.0)
MCHC: 31.4 g/dL (ref 30.0–36.0)
MCV: 83 fL (ref 80.0–100.0)
Platelets: 191 10*3/uL (ref 150–400)
RBC: 4.41 MIL/uL (ref 3.87–5.11)
RDW: 16 % — ABNORMAL HIGH (ref 11.5–15.5)
WBC: 8.8 10*3/uL (ref 4.0–10.5)
nRBC: 0 % (ref 0.0–0.2)

## 2020-04-05 LAB — SARS CORONAVIRUS 2 (TAT 6-24 HRS): SARS Coronavirus 2: NEGATIVE

## 2020-04-05 LAB — HEPARIN LEVEL (UNFRACTIONATED)
Heparin Unfractionated: 1.01 IU/mL — ABNORMAL HIGH (ref 0.30–0.70)
Heparin Unfractionated: 0.21 IU/mL — ABNORMAL LOW (ref 0.30–0.70)
Heparin Unfractionated: 0.32 IU/mL (ref 0.30–0.70)

## 2020-04-05 MED ORDER — HYDRALAZINE HCL 25 MG PO TABS
50.0000 mg | ORAL_TABLET | Freq: Three times a day (TID) | ORAL | Status: DC
Start: 2020-04-05 — End: 2020-04-06
  Administered 2020-04-05 – 2020-04-06 (×3): 50 mg via ORAL
  Filled 2020-04-05 (×4): qty 2

## 2020-04-05 MED ORDER — LABETALOL HCL 5 MG/ML IV SOLN: 10.0000 mg | INTRAVENOUS | Status: DC | PRN

## 2020-04-05 MED ORDER — ISOSORBIDE MONONITRATE ER 60 MG PO TB24
30.0000 mg | ORAL_TABLET | Freq: Every day | ORAL | Status: DC
  Administered 2020-04-05 – 2020-04-06 (×2): 30 mg via ORAL
  Filled 2020-04-05 (×2): qty 1

## 2020-04-05 MED ORDER — HEPARIN (PORCINE) 25000 UT/250ML-% IV SOLN
950.0000 [IU]/h | INTRAVENOUS | Status: DC
  Administered 2020-04-05: 950 [IU]/h via INTRAVENOUS
  Filled 2020-04-05: qty 250

## 2020-04-05 NOTE — Progress Notes (Signed)
[place] for Heparin Indication: VTE treatment  Allergies  Allergen Reactions  . Codeine Nausea And Vomiting  . Tramadol     Patient Measurements: Height: 5\' 6"  (167.6 cm) Weight: 92.5 kg (204 lb) IBW/kg (Calculated) : 59.3 HEPARIN DW (KG): 79.6  Vital Signs: Temp: 98.8 F (37.1 C) (03/17 0526) Temp Source: Oral (03/17 0526) BP: 160/79 (03/17 0526) Pulse Rate: 74 (03/17 0527)  Labs: Recent Labs    04/04/20 1339 04/04/20 1444 04/04/20 1629 04/04/20 2208 04/05/20 0535  HGB 11.6*  --   --   --  11.5*  HCT 36.4  --   --   --  36.6  PLT 167  --   --   --  191  HEPARINUNFRC  --   --   --  0.90* 1.01*  CREATININE 1.19*  --   --   --  1.11*  TROPONINIHS  --  36* 64* 137*  --     Estimated Creatinine Clearance: 44.8 mL/min (A) (by C-G formula based on SCr of 1.11 mg/dL (H)).  Assessment: Patient presented to ED with left arm swelling and HTN. Left upper extremity DVT study is positive for occlusive thrombus involving the left internal jugular vein, left subclavian vein, left axillary vein, left brachial vein, and left basilic vein.  Pt started on heparin per pharmacy.  Heparin level now up to 1.01 (rising even with rate decrease). No bleeding noted.  Goal of Therapy:  Heparin level 0.3-0.7 units/ml Monitor platelets by anticoagulation protocol: Yes   Plan:  Hold heparin x 1 hour  Restart heparin infusion at 950 units/hr Will f/u 8 hr heparin level  Sherlon Handing, PharmD, BCPS Please see amion for complete clinical pharmacist phone list 04/05/2020,6:51 AM

## 2020-04-05 NOTE — Consult Note (Signed)
[facility]  Name: Robin [name]      MRN: [id number]    Location: K270/W237-62  Date: 04/05/2020 Time:6:33 PM   REFERRING PHYSICIAN: Dr. Joesph Fillers  REASON FOR CONSULT: Possible metastatic breast cancer   HISTORY OF PRESENT ILLNESS: Mrs. Robin [name] is a very pleasant 80 year old African-American female who is seen in consultation at the request of Dr. Joesph Fillers for left chest wall mass in a patient with history of breast cancer.  She was reportedly diagnosed with right breast cancer in 1987 and underwent mastectomy, not requiring any adjuvant chemotherapy or radiation.  In 2007, she was diagnosed with left breast cancer, status post left mastectomy at [facility] by Dr. Carlis Abbott.  She did not require any chemo or radiation.  She was placed on tamoxifen which was discontinued around 2015.  She has some chronic back pain issues.  However she has developed pain in between her shoulder blades lately.  She also reported about 20 pound weight loss in the last 3 to 6 months.  She lives at home with her granddaughter.  She is able to do all her day-to-day activities without any problems.  She noticed swelling on her left upper extremity for last 1 week and presented to the ER.  Doppler done showed a left upper extremity DVT.  A CT scan angio PE protocol on 04/04/2020 showed no clear pulmonary embolism.  Mild cardiomegaly with evidence of mild interstitial pulmonary edema in the lungs.  Irregular masslike area in the left chest wall concerning for potential recurrent breast neoplasm measuring approximately 4.5 x 3.1 cm.  There are also lytic lesions in the T1 and T3 vertebral bodies as well as the manubrium sternum.  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  No chemical exposure.  Denies any smoking history.  No family history of malignancies.  PAST MEDICAL HISTORY:   Past Medical History:  Diagnosis Date  . Breast cancer ([place])    Tamoxifen started 2007   . CAD (coronary artery disease)    DES second diagonal 2/12  . Cardiomyopathy    Probable Takotsubo, LVEF 30-35% 2/12  . Carotid artery disease ([place])   . Essential hypertension   . Hepatic steatosis   . Hyperlipidemia   . Hypothyroidism   . NSTEMI (non-ST elevated myocardial infarction) ([place])    2/12  . Pancreas divisum   . Stroke ([place])    (2000) residual left-sided weakness  . Type 2 diabetes mellitus ([facility])     ALLERGIES: Allergies  Allergen Reactions  . Codeine Nausea And Vomiting  . Tramadol       MEDICATIONS: I have reviewed the patient's current medications.     PAST SURGICAL HISTORY Past Surgical History:  Procedure Laterality Date  . ABDOMINAL HYSTERECTOMY    . CAROTID ENDARTERECTOMY     Left  . CHOLECYSTECTOMY    . JOINT REPLACEMENT     Right knee [facility]- [facility]. [facility]  . MASTECTOMY     Bilateral    FAMILY HISTORY: Family History  Problem Relation Age of Onset  . Heart attack Sister   . Heart disease Sister   . Heart attack Brother   . Heart disease Brother     SOCIAL HISTORY:  reports that she has never smoked. She has never used smokeless tobacco. She reports that she does not drink alcohol and does not use drugs.  PERFORMANCE STATUS: The patient's performance status is 2 - Symptomatic, <50% confined to bed  PHYSICAL EXAM: Most Recent Vital Signs: Blood pressure (!) 112/43, pulse (!) 59, temperature 98.2 F (36.8 C), temperature source Oral, resp. rate 16, height _0  (1.676 m), weight 204 lb (92.5 kg), SpO2 99 %. BP (!) 112/43   Pulse (!) 59   Temp 98.2 F (36.8 C) (Oral)   Resp 16   Ht _1  (1.676 m)   Wt 204 lb (92.5 kg)   SpO2 99%   BMI 32.93 kg/m  General appearance: alert, cooperative and appears stated age Head: Normocephalic, without obvious abnormality, atraumatic Eyes: Extraocular movements intact.  No icterus. Neck: no adenopathy Lungs: clear to auscultation bilaterally Breasts: Right mastectomy site is  within normal limits with no palpable mass.  Weakly palpable mass at the left mastectomy site with no skin changes.  No palpable adenopathy. Heart: regular rate and rhythm Abdomen: soft, non-tender; bowel sounds normal; no masses,  no organomegaly Extremities: No edema or cyanosis of the lower extremities.  Left upper extremity swelling present. Lymph nodes: Cervical, supraclavicular, and axillary nodes normal. Neurologic: Grossly normal  LABORATORY DATA:  Results for orders placed or performed during the hospital encounter of 04/04/20 (from the past 48 hour(s))  Brain natriuretic peptide     Status: None   Collection Time: 04/04/20  1:39 PM  Result Value Ref Range   B Natriuretic Peptide 92.0 0.0 - 100.0 pg/mL    Comment: Performed at [facility], [address], [place], [place] [address]  CBC with Differential     Status: Abnormal   Collection Time: 04/04/20  1:39 PM  Result Value Ref Range   WBC 7.4 4.0 - 10.5 K/uL   RBC 4.34 3.87 - 5.11 MIL/uL   Hemoglobin 11.6 (L) 12.0 - 15.0 g/dL   HCT 36.4 36.0 - 46.0 %   MCV 83.9 80.0 - 100.0 fL   MCH 26.7 26.0 - 34.0 pg   MCHC 31.9 30.0 - 36.0 g/dL   RDW 16.0 (H) 11.5 - 15.5 %   Platelets 167 150 - 400 K/uL   nRBC 0.0 0.0 - 0.2 %   Neutrophils Relative % 78 %   Neutro Abs 5.8 1.7 - 7.7 K/uL   Lymphocytes Relative 12 %   Lymphs Abs 0.9 0.7 - 4.0 K/uL   Monocytes Relative 7 %   Monocytes Absolute 0.5 0.1 - 1.0 K/uL   Eosinophils Relative 2 %   Eosinophils Absolute 0.2 0.0 - 0.5 K/uL   Basophils Relative 0 %   Basophils Absolute 0.0 0.0 - 0.1 K/uL   Immature Granulocytes 1 %   Abs Immature Granulocytes 0.05 0.00 - 0.07 K/uL    Comment: Performed at [facility], [address], [place], [place] [address]  Comprehensive metabolic panel     Status: Abnormal   Collection Time: 04/04/20  1:39 PM  Result Value Ref Range   Sodium 140 135 - 145 mmol/L   Potassium 3.4 (L) 3.5 - 5.1 mmol/L   Chloride 105 98 - 111 mmol/L   CO2 24 22 - 32  mmol/L   Glucose, Bld 101 (H) 70 - 99 mg/dL    Comment: Glucose reference range applies only to samples taken after fasting for at least 8 hours.   BUN 23 8 - 23 mg/dL   Creatinine, Ser 1.19 (H) 0.44 - 1.00 mg/dL   Calcium 9.6 8.9 - 10.3 mg/dL   Total Protein 7.5 6.5 - 8.1 g/dL   Albumin 3.9 3.5 - 5.0 g/dL   AST 29 15 - 41 U/L  ALT 14 0 - 44 U/L   Alkaline Phosphatase 100 38 - 126 U/L   Total Bilirubin 0.9 0.3 - 1.2 mg/dL   GFR, Estimated 46 (L) >60 mL/min    Comment: (NOTE) Calculated using the CKD-EPI Creatinine Equation (2021)    Anion gap 11 5 - 15    Comment: Performed at [facility], [address] [place], [place] [address]  Troponin I (High Sensitivity)     Status: Abnormal   Collection Time: 04/04/20  2:44 PM  Result Value Ref Range   Troponin I (High Sensitivity) 36 (H) <18 ng/L    Comment: (NOTE) Elevated high sensitivity troponin I (hsTnI) values and significant  changes across serial measurements may suggest ACS but many other  chronic and acute conditions are known to elevate hsTnI results.  Refer to the "Links" section for chest pain algorithms and additional  guidance. Performed at [facility], [address] [place], [place] [address]   Troponin I (High Sensitivity)     Status: Abnormal   Collection Time: 04/04/20  4:29 PM  Result Value Ref Range   Troponin I (High Sensitivity) 64 (H) <18 ng/L    Comment: DELTA CHECK NOTED RESULT CALLED TO, READ BACK BY AND VERIFIED WITH: WHITE,M ON 04/04/20 AT 1720 BY LOY,C (NOTE) Elevated high sensitivity troponin I (hsTnI) values and significant  changes across serial measurements may suggest ACS but many other  chronic and acute conditions are known to elevate hsTnI results.  Refer to the Links section for chest pain algorithms and additional  guidance. Performed at [facility], [address], [place], [place] [address]   SARS CORONAVIRUS 2 (TAT 6-24 HRS) Nasopharyngeal Nasopharyngeal Swab     Status: None    Collection Time: 04/04/20  5:01 PM   Specimen: Nasopharyngeal Swab  Result Value Ref Range   SARS Coronavirus 2 NEGATIVE NEGATIVE    Comment: (NOTE) SARS-CoV-2 target nucleic acids are NOT DETECTED.  The SARS-CoV-2 RNA is generally detectable in upper and lower respiratory specimens during the acute phase of infection. Negative results do not preclude SARS-CoV-2 infection, do not rule out co-infections with other pathogens, and should not be used as the sole basis for treatment or other patient management decisions. Negative results must be combined with clinical observations, patient history, and epidemiological information. The expected result is Negative.  Fact Sheet for Patients: SugarRoll.be  Fact Sheet for Healthcare Providers: https://www.woods-mathews.com/  This test is not yet approved or cleared by the [place] FDA and  has been authorized for detection and/or diagnosis of SARS-CoV-2 by FDA under an Emergency Use Authorization (EUA). This EUA will remain  in effect (meaning this test can be used) for the duration of the COVID-19 declaration under Se ction 564(b)(1) of the Act, 21 U.S.C. section 360bbb-3(b)(1), unless the authorization is terminated or revoked sooner.  Performed at [facility], [place] [address] [place], [place] [address]   Heparin level (unfractionated)     Status: Abnormal   Collection Time: 04/04/20 10:08 PM  Result Value Ref Range   Heparin Unfractionated 0.90 (H) 0.30 - 0.70 IU/mL    Comment: (NOTE) If heparin results are below expected values, and patient dosage has  been confirmed, suggest follow up testing of antithrombin III levels. Performed at [facility], [address], [place], [place] [address]   Troponin I (High Sensitivity)     Status: Abnormal   Collection Time: 04/04/20 10:08 PM  Result Value Ref Range   Troponin I (High  Sensitivity) 137 (HH) <18 ng/L    Comment: CRITICAL  RESULT CALLED TO, READ BACK BY AND VERIFIED WITH: GRAHAM,S ON 04/04/20 AT 2305 BY LOY,C (NOTE) Elevated high sensitivity troponin I (hsTnI) values and significant  changes across serial measurements may suggest ACS but many other  chronic and acute conditions are known to elevate hsTnI results.  Refer to the Links section for chest pain algorithms and additional  guidance. Performed at [facility], [address], [place], [place] [address]   CBC     Status: Abnormal   Collection Time: 04/05/20  5:35 AM  Result Value Ref Range   WBC 8.8 4.0 - 10.5 K/uL   RBC 4.41 3.87 - 5.11 MIL/uL   Hemoglobin 11.5 (L) 12.0 - 15.0 g/dL   HCT 36.6 36.0 - 46.0 %   MCV 83.0 80.0 - 100.0 fL   MCH 26.1 26.0 - 34.0 pg   MCHC 31.4 30.0 - 36.0 g/dL   RDW 16.0 (H) 11.5 - 15.5 %   Platelets 191 150 - 400 K/uL   nRBC 0.0 0.0 - 0.2 %    Comment: Performed at [facility], [address] [place], [place] [address]  Basic metabolic panel     Status: Abnormal   Collection Time: 04/05/20  5:35 AM  Result Value Ref Range   Sodium 139 135 - 145 mmol/L   Potassium 3.2 (L) 3.5 - 5.1 mmol/L   Chloride 103 98 - 111 mmol/L   CO2 23 22 - 32 mmol/L   Glucose, Bld 108 (H) 70 - 99 mg/dL    Comment: Glucose reference range applies only to samples taken after fasting for at least 8 hours.   BUN 21 8 - 23 mg/dL   Creatinine, Ser 1.11 (H) 0.44 - 1.00 mg/dL   Calcium 9.9 8.9 - 10.3 mg/dL   GFR, Estimated 50 (L) >60 mL/min    Comment: (NOTE) Calculated using the CKD-EPI Creatinine Equation (2021)    Anion gap 13 5 - 15    Comment: Performed at [facility], [address] [place], [place] [address]  Heparin level (unfractionated)     Status: Abnormal   Collection Time: 04/05/20  5:35 AM  Result Value Ref Range   Heparin Unfractionated 1.01 (H) 0.30 - 0.70 IU/mL    Comment: (NOTE) If heparin results are below expected values, and patient dosage has  been confirmed, suggest follow up testing of antithrombin III  levels. Performed at [facility], [address] [place], [place] [address]   Heparin level (unfractionated)     Status: Abnormal   Collection Time: 04/05/20  4:31 PM  Result Value Ref Range   Heparin Unfractionated 0.21 (L) 0.30 - 0.70 IU/mL    Comment: (NOTE) If heparin results are below expected values, and patient dosage has  been confirmed, suggest follow up testing of antithrombin III levels. Performed at [facility], [address], [place],  [address]       RADIOGRAPHY: CT Angio Chest PE W and/or Wo Contrast  Result Date: 04/04/2020 CLINICAL DATA:  80 year old female with shortness of breath for the past 2 weeks, worsening over the past 2-3 days. Evaluate for pulmonary embolism. History of breast cancer status post bilateral mastectomy in 2007. EXAM: CT ANGIOGRAPHY CHEST WITH CONTRAST TECHNIQUE: Multidetector CT imaging of the chest was performed using the standard protocol during bolus administration of intravenous contrast. Multiplanar CT image reconstructions and MIPs were obtained to evaluate the vascular anatomy. CONTRAST:  71m OMNIPAQUE IOHEXOL 350 MG/ML  SOLN COMPARISON:  No priors. FINDINGS: Comment: Today's study is limited by suboptimal contrast bolus and extends patient respiratory motion. Cardiovascular: Within the limitations of today's examination, there is no central, lobar or proximal segmental sized pulmonary embolism. Distal segmental and subsegmental sized pulmonary embolism cannot be entirely excluded on the basis of today's limited examination. Heart size is mildly enlarged. There is no significant pericardial fluid, thickening or pericardial calcification. There is aortic atherosclerosis, as well as atherosclerosis of the great vessels of the mediastinum and the coronary arteries, including calcified atherosclerotic plaque in the left main, left anterior descending, left circumflex and right coronary arteries. Mediastinum/Nodes: No pathologically enlarged  mediastinal or hilar lymph nodes. Esophagus is unremarkable in appearance. No axillary lymphadenopathy. Lungs/Pleura: Patchy areas of mild ground-glass attenuation and interlobular septal thickening are noted throughout the lungs bilaterally. No confluent consolidative airspace disease. No pleural effusions. No definite suspicious appearing pulmonary nodules or masses are noted. Upper Abdomen: Aortic atherosclerosis. Musculoskeletal: In the left chest wall there is an area of soft tissue architectural distortion measuring 4.5 x 3.1 cm (axial image 68 of series 4), concerning for tumor recurrence in this patient status post bilateral modified radical mastectomy for breast cancer. Multiple lytic lesions are noted in the visualized skeleton, most evident in the T1 and T3 vertebral bodies, as well as the manubrium, concerning for potential metastatic lesions. Review of the MIP images confirms the above findings. IMPRESSION: 1. Limited study demonstrating no central, lobar, or proximal segmental sized pulmonary embolism. Smaller distal segmental and subsegmental sized emboli cannot be entirely excluded. 2. Mild cardiomegaly with evidence of mild interstitial pulmonary edema in the lungs; imaging findings suggestive of congestive heart failure. 3. Irregular masslike area in the left chest wall concerning for potential recurrent breast neoplasm measuring approximately 4.5 x 3.1 cm. There also lytic lesions in T1 and T3 vertebral bodies as well as the manubrium. The possibility of metastatic disease should be considered. Further evaluation with PET-CT should be considered. 4. Aortic atherosclerosis, in addition to left main and 3 vessel coronary artery disease. Aortic Atherosclerosis (ICD10-I70.0). Electronically Signed   By: Vinnie Langton M.D.   On: 04/04/2020 17:47   US Venous Img Upper Left (DVT Study)  Addendum Date: 04/04/2020   ADDENDUM REPORT: 04/04/2020 14:45 ADDENDUM: These results were called by telephone  at the time of interpretation on 04/04/2020 at 2:18 pm to provider GARRETT GREEN , who verbally acknowledged these results. Electronically Signed   By: Markus Daft M.D.   On: 04/04/2020 14:45   Result Date: 04/04/2020 CLINICAL DATA:  80 year old with left arm swelling. History of breast cancer. EXAM: LEFT UPPER EXTREMITY VENOUS DOPPLER ULTRASOUND TECHNIQUE: Gray-scale sonography with graded compression, as well as color Doppler and duplex ultrasound were performed to evaluate the upper extremity deep venous system from the level of the subclavian vein and including the jugular, axillary, basilic, radial, ulnar and upper cephalic vein. Spectral Doppler was utilized to evaluate flow at rest and with distal augmentation maneuvers. COMPARISON:  None. FINDINGS: Contralateral Subclavian Vein:  Patent with color Doppler flow. Internal Jugular Vein: Positive for thrombus. Echogenic thrombus in the left internal jugular vein. This appears to represent occlusive thrombus. Subclavian Vein: Positive for thrombus. Evidence for echogenic occlusive thrombus in the left subclavian vein. Axillary Vein: Positive for thrombus. Expansion of left axillary vein with echogenic occlusive thrombus. Cephalic Vein: No evidence of thrombus. Normal compressibility and color Doppler flow. Basilic Vein: Positive for thrombus. Left basilic vein is non compressible and no significant  flow. Brachial Veins: Positive for thrombus involving at least one left brachial vein. This is compatible with occlusive thrombus. Radial Veins: No evidence of thrombus.  Normal compressibility. Ulnar Veins: No evidence of thrombus. Normal compressibility. Limited evaluation. Subcutaneous edema in this area. Other Findings:  Subcutaneous edema near the wrist. IMPRESSION: Positive for deep venous thrombosis in the left upper extremity. Occlusive thrombus involving the left internal jugular vein, left subclavian vein, left axillary vein, left brachial vein and left  basilic vein. Electronically Signed: By: Markus Daft M.D. On: 04/04/2020 14:12   [place] CORE BIOPSY (SOFT TISSUE)  Result Date: 04/05/2020 CLINICAL DATA:  LEFT chest wall mass by CT. Scarring at LEFT chest wall secondary to prior LEFT breast cancer post BILATERAL mastectomy and LEFT axillary node dissection. EXAM: US GUIDED CORE BIOPSY OF LEFT CHEST WALL MASS COMPARISON:  Ultrasound LEFT chest wall 04/05/2020, CT angio chest 04/04/2020 PROCEDURE: I met with the patient and we discussed the procedure of ultrasound-guided biopsy, including benefits and alternatives. We discussed the high likelihood of a successful procedure. We discussed the risks of the procedure, including infection and bleeding. Informed written consent was given. The usual time-out protocol was performed immediately prior to the procedure. Using sterile technique and 1% Lidocaine as local anesthetic, under direct ultrasound visualization, 18 gauge and 14 gauge spring-loaded devices were used to perform biopsy of LEFT chest wall mass using a inferior to superior approach. Procedure tolerated well by patient without immediate complication. IMPRESSION: Ultrasound guided core biopsies of LEFT chest wall mass. No immediate complication. Electronically Signed   By: Lavonia Dana M.D.   On: 04/05/2020 14:50   [place] CHEST SOFT TISSUE  Result Date: 04/05/2020 CLINICAL DATA:  Abnormal CT chest, LEFT chest wall soft tissue mass, past history of BILATERAL mastectomy, LEFT breast cancer and axillary node dissection EXAM: ULTRASOUND OF CHEST SOFT TISSUES TECHNIQUE: Ultrasound examination of the chest wall soft tissues was performed in the area of clinical concern. COMPARISON:  CT angio chest 04/04/2020 FINDINGS: At the site of clinical concern, and irregular firm hypoechoic deep subcutaneous soft tissue nodule is identified which measures approximately 4.0 x 1.5 x 3.3 cm in size. Abnormality corresponds to the irregular soft tissue mass seen on CT and is  concerning for chest wall recurrence of tumor. Mass appears hypovascular. No definite fluid collections or calcifications. IMPRESSION: 4.0 cm diameter deep subcutaneous soft tissue mass of the LEFT chest wall corresponding to CT abnormality, question tumor recurrence of the chest wall. Lesion is amenable to percutaneous ultrasound-guided core biopsy. Electronically Signed   By: Lavonia Dana M.D.   On: 04/05/2020 12:17       PATHOLOGY: Pending from biopsy on 04/05/2020.  ASSESSMENT and PLAN:  1.  Left chest wall mass: -History of right breast cancer in 1987, status post right mastectomy requiring no adjuvant chemo or XRT.  Was not on tamoxifen. -History of left breast cancer in 2007, status post mastectomy by Dr. Carlis Abbott at [facility], placed on tamoxifen, discontinued around 2015. -Recent upper back pain, weight loss of 20 pounds in the last 3 to 6 months. -CT angio on 04/04/2020 showed irregular mass in the left chest wall concerning for recurrent breast cancer measuring 4.5 x 3.1 cm. -Also showed lytic lesions in T1 and T3 vertebral bodies as well as manubrium. -Ultrasound-guided biopsy of the left chest wall mass on 04/05/2020. -Recommend outpatient PET CT scan and follow-up in my office to discuss biopsy results and further treatment plan.  2.  Left  upper extremity DVT: -Left upper extremity Doppler on 04/04/2020 + for DVT involving left internal jugular vein, left subclavian vein, left axillary vein, left brachial and left basilic vein. -She is currently on IV heparin drip. -May transition to DOAC's prior to discharge.  All questions were answered. The patient knows to call the clinic with any problems, questions or concerns. We can certainly see the patient much sooner if necessary.   Derek Jack

## 2020-04-05 NOTE — Progress Notes (Signed)
Date and time results received: 04/05/20  2305  Test: Troponin Critical Value: 137  Name of Provider Notified: Olevia Bowens  Orders Received? Or Actions Taken?: MD aware, Heparin IV already infusing

## 2020-04-05 NOTE — Plan of Care (Signed)
  Problem: Education: Goal: Knowledge of General Education information will improve Description: Including pain rating scale, medication(s)/side effects and non-pharmacologic comfort measures Outcome: Progressing   Problem: Health Behavior/Discharge Planning: Goal: Ability to manage health-related needs will improve Outcome: Progressing   Problem: Clinical Measurements: Goal: Ability to maintain clinical measurements within normal limits will improve Outcome: Progressing Goal: Will remain free from infection Outcome: Progressing Goal: Cardiovascular complication will be avoided Outcome: Progressing   Problem: Activity: Goal: Risk for activity intolerance will decrease Outcome: Progressing   Problem: Nutrition: Goal: Adequate nutrition will be maintained Outcome: Progressing   Problem: Pain Managment: Goal: General experience of comfort will improve Outcome: Progressing   Problem: Safety: Goal: Ability to remain free from injury will improve Outcome: Progressing   Problem: Skin Integrity: Goal: Risk for impaired skin integrity will decrease Outcome: Progressing   

## 2020-04-05 NOTE — Progress Notes (Signed)
Patient Demographics:    Robin [name], is a 80 y.o. female, DOB - 1940/04/14, GHW:[id number]  Admit date - 04/04/2020   Admitting Physician Albertine Patricia, MD  Outpatient Primary MD for the patient is [name], MD  LOS - 0   Chief Complaint  Patient presents with  . Hypertension        Subjective:    [name] today has no fevers, no emesis,  No chest pain,   -no shortness of breath, no bleeding concerns -Patient's daughter [name] is at bedside, patient's other daughter [name] is on the phone  Assessment  & Plan :    Active Problems:   Coronary atherosclerosis of native coronary artery   Essential hypertension, benign   Mixed hyperlipidemia   Carotid artery disease (HCC)   Hypothyroidism   Acute DVT (deep venous thrombosis) (HCC)   Brief Summary:- 80 y.o. female, with past medical history of HTN, HLD, PAD-carotid artery stenosis status post endarterectomy, CAD, HFpEF/chronic grade 1 diastolic dysfunction,h/o CVA, hypothyroidism, history of breast cancer (patient had right breast cancer in 1987 and left breast cancer in 2014 both treated with mastectomies without chemo or radiation-- in remission since 2014 -Admitted on 04/04/2020 with left upper extremity DVT and concerns for left sided chest wall mass with metastases concerning for recurrent breast cancer  A/p 1) presumed stage IV left-sided breast cancer with bony metastasis ------discussed with general surgeon Dr. Aviva Signs also discussed with radiologist Dr. Thornton Papas --Discussed with oncologist Dr. Delton Coombes -Plan is for biopsy on 04/05/2020 -Await official oncology consult -patient had right breast cancer in 1987 and left breast cancer in 2014 both treated with mastectomies without chemo or radiation-- in remission since 2014 -Patient used to follow with Dr. Carl Best from [place], patient and family currently interested  to establish care with oncology here in [place] and to follow with local oncologist--Dr. Delton Coombes  2)Left upper extremity DVT--- continue IV heparin for now may switch to p.o. Eliquis once biopsies and procedures are completed -Anticipate lifelong anticoagulation given underlying malignancy  3)HTN--- continue amlodipine 10 mg daily, Coreg 0.5 mg twice daily, hydralazine 50 mg 3 times daily and isosorbide  4)H/o CAD/PAD--status post prior carotid endarterectomy--okay to stop Plavix as patient will need to be on Eliquis for DVT -Continue aspirin and Crestor as well as Coreg  5) hypothyroidism--stable, continue levothyroxine 137 mcg daily  6) social/ethics--- plan of care discussed with patient and her daughter [name] who is at bedside and patient's other daughter [name] who is on the phone -Patient is full code with full scope of treatment  Disposition/Need for in-Hospital Stay- patient unable to be discharged at this time due to DVT requiring IV heparin, presumed metastatic left-sided breast cancer requiring biopsy and further investigation   Disposition: The patient is from: Home              Anticipated d/c is to: Home              Anticipated d/c date is: 2 days              Patient currently is not medically stable to d/c. Barriers: Not Clinically Stable-   Code Status :  -  Code Status: Full Code   Family Communication:   (  patient is alert, awake and coherent)  Discussed with daughters   [name] and [place]  Consults  : Radiology/oncology/general surgery  DVT Prophylaxis  :   - SCDs -IV heparin      Lab Results  Component Value Date   PLT 191 04/05/2020    Inpatient Medications  Scheduled Meds: . amLODipine  10 mg Oral Daily  . carvedilol  12.5 mg Oral BID WC  . cholecalciferol  5,000 Units Oral Daily  . gabapentin  100 mg Oral TID  . hydrALAZINE  50 mg Oral TID  . levothyroxine  137 mcg Oral QAC breakfast  . omega-3 acid ethyl esters  1,000 mg Oral BID  .  pantoprazole  40 mg Oral Daily  . rosuvastatin  40 mg Oral Daily   Continuous Infusions: . heparin 950 Units/hr (04/05/20 0812)   PRN Meds:.acetaminophen **OR** acetaminophen, hydrALAZINE, iohexol    Anti-infectives (From admission, onward)   None        Objective:   Vitals:   04/04/20 1830 04/04/20 2001 04/05/20 0526 04/05/20 0527  BP: (!) 174/83 (!) 192/64 (!) 160/79   Pulse: 62 61 69 74  Resp: 13 18 18    Temp:  98.7 F (37.1 C) 98.8 F (37.1 C)   TempSrc:  Oral Oral   SpO2: 96% 98% 98% 99%  Weight:      Height:        Wt Readings from Last 3 Encounters:  04/04/20 92.5 kg  04/04/20 99.8 kg  03/29/20 100 kg    No intake or output data in the 24 hours ending 04/05/20 1028   Physical Exam  Gen:- Awake Alert,  In no apparent distress  HEENT:- [place].AT, No sclera icterus Neck-Supple Neck,No JVD,.  Lungs-  CTAB , fair symmetrical air movement Breast-scars from bilateral mastectomy CV- S1, S2 normal, regular  Abd-  +ve B.Sounds, Abd Soft, No tenderness,    Extremity/Skin:  pedal pulses present, left upper extremity swelling Psych-affect is appropriate, oriented x3 Neuro-no new focal deficits, no tremors   Data Review:   Micro Results Recent Results (from the past 240 hour(s))  SARS CORONAVIRUS 2 (TAT 6-24 HRS) Nasopharyngeal Nasopharyngeal Swab     Status: None   Collection Time: 04/04/20  5:01 PM   Specimen: Nasopharyngeal Swab  Result Value Ref Range Status   SARS Coronavirus 2 NEGATIVE NEGATIVE Final    Comment: (NOTE) SARS-CoV-2 target nucleic acids are NOT DETECTED.  The SARS-CoV-2 RNA is generally detectable in upper and lower respiratory specimens during the acute phase of infection. Negative results do not preclude SARS-CoV-2 infection, do not rule out co-infections with other pathogens, and should not be used as the sole basis for treatment or other patient management decisions. Negative results must be combined with clinical  observations, patient history, and epidemiological information. The expected result is Negative.  Fact Sheet for Patients: SugarRoll.be  Fact Sheet for Healthcare Providers: https://www.woods-mathews.com/  This test is not yet approved or cleared by the [place] FDA and  has been authorized for detection and/or diagnosis of SARS-CoV-2 by FDA under an Emergency Use Authorization (EUA). This EUA will remain  in effect (meaning this test can be used) for the duration of the COVID-19 declaration under Se ction 564(b)(1) of the Act, 21 U.S.C. section 360bbb-3(b)(1), unless the authorization is terminated or revoked sooner.  Performed at [facility], [place] [address], [place], [place] [address]     Radiology Reports CT Angio Chest PE W and/or Wo Contrast  Result Date: 04/04/2020 CLINICAL DATA:  80 year old female with shortness of breath for the past 2 weeks, worsening over the past 2-3 days. Evaluate for pulmonary embolism. History of breast cancer status post bilateral mastectomy in 2007. EXAM: CT ANGIOGRAPHY CHEST WITH CONTRAST TECHNIQUE: Multidetector CT imaging of the chest was performed using the standard protocol during bolus administration of intravenous contrast. Multiplanar CT image reconstructions and MIPs were obtained to evaluate the vascular anatomy. CONTRAST:  72mL OMNIPAQUE IOHEXOL 350 MG/ML SOLN COMPARISON:  No priors. FINDINGS: Comment: Today's study is limited by suboptimal contrast bolus and extends patient respiratory motion. Cardiovascular: Within the limitations of today's examination, there is no central, lobar or proximal segmental sized pulmonary embolism. Distal segmental and subsegmental sized pulmonary embolism cannot be entirely excluded on the basis of today's limited examination. Heart size is mildly enlarged. There is no significant pericardial fluid, thickening or pericardial calcification. There is aortic  atherosclerosis, as well as atherosclerosis of the great vessels of the mediastinum and the coronary arteries, including calcified atherosclerotic plaque in the left main, left anterior descending, left circumflex and right coronary arteries. Mediastinum/Nodes: No pathologically enlarged mediastinal or hilar lymph nodes. Esophagus is unremarkable in appearance. No axillary lymphadenopathy. Lungs/Pleura: Patchy areas of mild ground-glass attenuation and interlobular septal thickening are noted throughout the lungs bilaterally. No confluent consolidative airspace disease. No pleural effusions. No definite suspicious appearing pulmonary nodules or masses are noted. Upper Abdomen: Aortic atherosclerosis. Musculoskeletal: In the left chest wall there is an area of soft tissue architectural distortion measuring 4.5 x 3.1 cm (axial image 68 of series 4), concerning for tumor recurrence in this patient status post bilateral modified radical mastectomy for breast cancer. Multiple lytic lesions are noted in the visualized skeleton, most evident in the T1 and T3 vertebral bodies, as well as the manubrium, concerning for potential metastatic lesions. Review of the MIP images confirms the above findings. IMPRESSION: 1. Limited study demonstrating no central, lobar, or proximal segmental sized pulmonary embolism. Smaller distal segmental and subsegmental sized emboli cannot be entirely excluded. 2. Mild cardiomegaly with evidence of mild interstitial pulmonary edema in the lungs; imaging findings suggestive of congestive heart failure. 3. Irregular masslike area in the left chest wall concerning for potential recurrent breast neoplasm measuring approximately 4.5 x 3.1 cm. There also lytic lesions in T1 and T3 vertebral bodies as well as the manubrium. The possibility of metastatic disease should be considered. Further evaluation with PET-CT should be considered. 4. Aortic atherosclerosis, in addition to left main and 3 vessel  coronary artery disease. Aortic Atherosclerosis (ICD10-I70.0). Electronically Signed   By: Vinnie Langton M.D.   On: 04/04/2020 17:47   MR Cervical Spine Wo Contrast  Addendum Date: 03/13/2020   ADDENDUM REPORT: 03/13/2020 10:57 ADDENDUM: Also under the impression: Mildly STIR hyperintense lesions measuring up to 1.0 cm at the T1 and T3 levels may reflect atypical hemangiomata. Consider postcontrast MRI if suspicion persists. Electronically Signed   By: Primitivo Gauze M.D.   On: 03/13/2020 10:57   Result Date: 03/13/2020 CLINICAL DATA:  Cervical radiculopathy, no red flags EXAM: MRI CERVICAL SPINE WITHOUT CONTRAST TECHNIQUE: Multiplanar, multisequence MR imaging of the cervical spine was performed. No intravenous contrast was administered. COMPARISON:  03/01/2020. FINDINGS: Alignment: Straightening of lordosis. Grade 1 C6-7 retrolisthesis. Grade 1 C7-T1 anterolisthesis. Vertebrae: Vertebral body heights are preserved. Multilevel Modic type 2 endplate degenerative changes. T1/T2 hypointense lesion underlying the superior T1 endplate with mild STIR hyperintensity measuring 1.0 cm. Similar signal intensity lesion measuring 0.7 cm  at the T3 level. Cord: Normal signal and morphology. Posterior Fossa, vertebral arteries: Negative. Disc levels: Multilevel desiccation and disc space loss. C2-3: Disc osteophyte complex with uncovertebral and facet degenerative spurring. Patent spinal canal. Mild bilateral neural foraminal narrowing. C3-4: Disc osteophyte complex with superimposed left subarticular protrusion. Uncovertebral and facet hypertrophy. Mild spinal canal and bilateral neural foraminal narrowing. C4-5: Disc osteophyte complex with right subarticular protrusion. Uncovertebral and facet hypertrophy. Mild spinal canal and bilateral neural foraminal narrowing. C5-6: Disc osteophyte complex with uncovertebral and facet hypertrophy. Mild spinal canal and bilateral neural foraminal narrowing. C6-7: Disc  osteophyte complex with shallow left paracentral protrusion. Uncovertebral and facet hypertrophy. Moderate spinal canal, mild right and moderate left neural foraminal narrowing. C7-T1: No significant disc bulge. Uncovertebral and bilateral facet hypertrophy. 6 mm right perineural cyst. Patent spinal canal and neural foramen. Paraspinal tissues: 4 mm right T1-2 perineural cyst. Laterally oriented 8 mm synovial cyst arising from the right facet joint at C2-3 level. IMPRESSION: Multilevel spondylosis. Mild C3-6 and moderate C6-7 spinal canal narrowing. Mild bilateral C2-C6 and right C6-7 neural foraminal narrowing. Moderate left C6-7 neural foraminal narrowing. Electronically Signed: By: Primitivo Gauze M.D. On: 03/13/2020 10:28   US Venous Img Upper Left (DVT Study)  Addendum Date: 04/04/2020   ADDENDUM REPORT: 04/04/2020 14:45 ADDENDUM: These results were called by telephone at the time of interpretation on 04/04/2020 at 2:18 pm to provider GARRETT GREEN , who verbally acknowledged these results. Electronically Signed   By: Markus Daft M.D.   On: 04/04/2020 14:45   Result Date: 04/04/2020 CLINICAL DATA:  80 year old with left arm swelling. History of breast cancer. EXAM: LEFT UPPER EXTREMITY VENOUS DOPPLER ULTRASOUND TECHNIQUE: Gray-scale sonography with graded compression, as well as color Doppler and duplex ultrasound were performed to evaluate the upper extremity deep venous system from the level of the subclavian vein and including the jugular, axillary, basilic, radial, ulnar and upper cephalic vein. Spectral Doppler was utilized to evaluate flow at rest and with distal augmentation maneuvers. COMPARISON:  None. FINDINGS: Contralateral Subclavian Vein:  Patent with color Doppler flow. Internal Jugular Vein: Positive for thrombus. Echogenic thrombus in the left internal jugular vein. This appears to represent occlusive thrombus. Subclavian Vein: Positive for thrombus. Evidence for echogenic occlusive  thrombus in the left subclavian vein. Axillary Vein: Positive for thrombus. Expansion of left axillary vein with echogenic occlusive thrombus. Cephalic Vein: No evidence of thrombus. Normal compressibility and color Doppler flow. Basilic Vein: Positive for thrombus. Left basilic vein is non compressible and no significant flow. Brachial Veins: Positive for thrombus involving at least one left brachial vein. This is compatible with occlusive thrombus. Radial Veins: No evidence of thrombus.  Normal compressibility. Ulnar Veins: No evidence of thrombus. Normal compressibility. Limited evaluation. Subcutaneous edema in this area. Other Findings:  Subcutaneous edema near the wrist. IMPRESSION: Positive for deep venous thrombosis in the left upper extremity. Occlusive thrombus involving the left internal jugular vein, left subclavian vein, left axillary vein, left brachial vein and left basilic vein. Electronically Signed: By: Markus Daft M.D. On: 04/04/2020 14:12     CBC Recent Labs  Lab 04/04/20 1339 04/05/20 0535  WBC 7.4 8.8  HGB 11.6* 11.5*  HCT 36.4 36.6  PLT 167 191  MCV 83.9 83.0  MCH 26.7 26.1  MCHC 31.9 31.4  RDW 16.0* 16.0*  LYMPHSABS 0.9  --   MONOABS 0.5  --   EOSABS 0.2  --   BASOSABS 0.0  --     Chemistries  Recent Labs  Lab 04/04/20 1339 04/05/20 0535  NA 140 139  K 3.4* 3.2*  CL 105 103  CO2 24 23  GLUCOSE 101* 108*  BUN 23 21  CREATININE 1.19* 1.11*  CALCIUM 9.6 9.9  AST 29  --   ALT 14  --   ALKPHOS 100  --   BILITOT 0.9  --    ------------------------------------------------------------------------------------------------------------------ No results for input(s): CHOL, HDL, LDLCALC, TRIG, CHOLHDL, LDLDIRECT in the last 72 hours.  Lab Results  Component Value Date   HGBA1C 5.9 (H) 03/01/2020   ------------------------------------------------------------------------------------------------------------------ No results for input(s): TSH, T4TOTAL, T3FREE,  THYROIDAB in the last 72 hours.  Invalid input(s): FREET3 ------------------------------------------------------------------------------------------------------------------ No results for input(s): VITAMINB12, FOLATE, FERRITIN, TIBC, IRON, RETICCTPCT in the last 72 hours.  Coagulation profile No results for input(s): INR, PROTIME in the last 168 hours.  No results for input(s): DDIMER in the last 72 hours.  Cardiac Enzymes No results for input(s): CKMB, TROPONINI, MYOGLOBIN in the last 168 hours.  Invalid input(s): CK ------------------------------------------------------------------------------------------------------------------    Component Value Date/Time   BNP 92.0 04/04/2020 1339     Alf Doyle M.D on 04/05/2020 at 10:28 AM  Go to [email or web address] - for contact info  [facility] - Office  [phone]

## 2020-04-05 NOTE — Progress Notes (Signed)
[place] for Heparin Indication: VTE treatment  Allergies  Allergen Reactions  . Codeine Nausea And Vomiting  . Tramadol     Patient Measurements: Height: 5\' 6"  (167.6 cm) Weight: 92.5 kg (204 lb) IBW/kg (Calculated) : 59.3 HEPARIN DW (KG): 79.6  Vital Signs: Temp: 98.1 F (36.7 C) (03/17 2007) Temp Source: Oral (03/17 1417) BP: 132/58 (03/17 2007) Pulse Rate: 61 (03/17 2007)  Labs: Recent Labs    04/04/20 1339 04/04/20 1444 04/04/20 1629 04/04/20 2208 04/04/20 2208 04/05/20 0535 04/05/20 1631 04/05/20 2140  HGB 11.6*  --   --   --   --  11.5*  --   --   HCT 36.4  --   --   --   --  36.6  --   --   PLT 167  --   --   --   --  191  --   --   HEPARINUNFRC  --   --   --  0.90*   < > 1.01* 0.21* 0.32  CREATININE 1.19*  --   --   --   --  1.11*  --   --   TROPONINIHS  --  36* 64* 137*  --   --   --   --    < > = values in this interval not displayed.    Estimated Creatinine Clearance: 44.8 mL/min (A) (by C-G formula based on SCr of 1.11 mg/dL (H)).  Assessment: Patient presented to ED with left arm swelling and HTN. Left upper extremity DVT study is positive for occlusive thrombus involving the left internal jugular vein, left subclavian vein, left axillary vein, left brachial vein, and left basilic vein.  Pt started on heparin per pharmacy.  HL 0.32 within goal range.  Hbg 11.5 (Slightly low), Hct 36.6 (wnl), plt 191 (wnl) No concerns for bleeding.  Goal of Therapy:  Heparin level 0.3-0.7 units/ml Monitor platelets by anticoagulation protocol: Yes   Plan:  Continue heparin infusion at 950 units/hr F/u with AM heparin level  Acey Lav, PharmD Clinical Pharmacist 04/05/2020 10:22 PM

## 2020-04-05 NOTE — Care Management Obs Status (Signed)
[facility] NOTIFICATION   Patient Details  Name: Robin [name] MRN: [id number] Date of Birth: 1940/10/16   Medicare Observation Status Notification Given:  Yes    Boneta Lucks, RN 04/05/2020, 3:28 PM

## 2020-04-05 NOTE — Progress Notes (Signed)
[facility] for Heparin Indication: VTE treatment  Allergies  Allergen Reactions  . Codeine Nausea And Vomiting  . Tramadol     Patient Measurements: Height: 5\' 6"  (167.6 cm) Weight: 92.5 kg (204 lb) IBW/kg (Calculated) : 59.3 HEPARIN DW (KG): 79.6  Vital Signs: Temp: 98.2 F (36.8 C) (03/17 1417) Temp Source: Oral (03/17 1417) BP: 112/43 (03/17 1715) Pulse Rate: 59 (03/17 1715)  Labs: Recent Labs    04/04/20 1339 04/04/20 1444 04/04/20 1629 04/04/20 2208 04/05/20 0535 04/05/20 1631  HGB 11.6*  --   --   --  11.5*  --   HCT 36.4  --   --   --  36.6  --   PLT 167  --   --   --  191  --   HEPARINUNFRC  --   --   --  0.90* 1.01* 0.21*  CREATININE 1.19*  --   --   --  1.11*  --   TROPONINIHS  --  36* 64* 137*  --   --     Estimated Creatinine Clearance: 44.8 mL/min (A) (by C-G formula based on SCr of 1.11 mg/dL (H)).  Assessment: Patient presented to ED with left arm swelling and HTN. Left upper extremity DVT study is positive for occlusive thrombus involving the left internal jugular vein, left subclavian vein, left axillary vein, left brachial vein, and left basilic vein.  Pt started on heparin per pharmacy.  HL 0.21- low due to heparin being held from 12-2 PM during procedure.  Goal of Therapy:  Heparin level 0.3-0.7 units/ml Monitor platelets by anticoagulation protocol: Yes   Plan:  Continue heparin infusion at 950 units/hr Will f/u 6-8 hr heparin level from restart  Margot Ables, PharmD Clinical Pharmacist 04/05/2020 5:19 PM

## 2020-04-05 NOTE — Procedures (Signed)
PreOperative Dx: Mass of left chest wall Postoperative Dx: Mass of left chest wall Procedure:   [place] Core BXs of LEFT chest wall mass Radiologist:  Thornton Papas Anesthesia:  5 ml of 1% lidocaine Specimen:  Multiple 18g and 14 g core biopsies EBL:   < 1 ml Complications:  None

## 2020-04-06 ENCOUNTER — Other Ambulatory Visit (HOSPITAL_COMMUNITY): Payer: Self-pay

## 2020-04-06 DIAGNOSIS — I82622 Acute embolism and thrombosis of deep veins of left upper extremity: Secondary | ICD-10-CM | POA: Diagnosis not present

## 2020-04-06 DIAGNOSIS — Z170 Estrogen receptor positive status [ER+]: Secondary | ICD-10-CM

## 2020-04-06 DIAGNOSIS — I10 Essential (primary) hypertension: Secondary | ICD-10-CM | POA: Diagnosis not present

## 2020-04-06 DIAGNOSIS — I779 Disorder of arteries and arterioles, unspecified: Secondary | ICD-10-CM

## 2020-04-06 DIAGNOSIS — E039 Hypothyroidism, unspecified: Secondary | ICD-10-CM | POA: Diagnosis not present

## 2020-04-06 DIAGNOSIS — R222 Localized swelling, mass and lump, trunk: Secondary | ICD-10-CM

## 2020-04-06 DIAGNOSIS — C50912 Malignant neoplasm of unspecified site of left female breast: Secondary | ICD-10-CM

## 2020-04-06 LAB — CBC
HCT: 31.1 % — ABNORMAL LOW (ref 36.0–46.0)
Hemoglobin: 9.8 g/dL — ABNORMAL LOW (ref 12.0–15.0)
MCH: 26.3 pg (ref 26.0–34.0)
MCHC: 31.5 g/dL (ref 30.0–36.0)
MCV: 83.4 fL (ref 80.0–100.0)
Platelets: 166 10*3/uL (ref 150–400)
RBC: 3.73 MIL/uL — ABNORMAL LOW (ref 3.87–5.11)
RDW: 16.2 % — ABNORMAL HIGH (ref 11.5–15.5)
WBC: 8.1 10*3/uL (ref 4.0–10.5)
nRBC: 0 % (ref 0.0–0.2)

## 2020-04-06 LAB — HEPARIN LEVEL (UNFRACTIONATED): Heparin Unfractionated: 0.49 IU/mL (ref 0.30–0.70)

## 2020-04-06 MED ORDER — ASPIRIN 81 MG PO TABS: 81.0000 mg | ORAL_TABLET | Freq: Every day | ORAL | 3 refills | Status: DC

## 2020-04-06 MED ORDER — APIXABAN 5 MG PO TABS: 5.0000 mg | ORAL_TABLET | Freq: Two times a day (BID) | ORAL | 5 refills | Status: DC

## 2020-04-06 MED ORDER — AMLODIPINE BESYLATE 10 MG PO TABS: 10.0000 mg | ORAL_TABLET | Freq: Every day | ORAL | 3 refills | Status: DC

## 2020-04-06 MED ORDER — ELIQUIS DVT/PE STARTER PACK 5 MG PO TBPK: ORAL_TABLET | ORAL | 0 refills | Status: DC

## 2020-04-06 MED ORDER — POTASSIUM CHLORIDE ER 10 MEQ PO TBCR: 10.0000 meq | EXTENDED_RELEASE_TABLET | Freq: Every day | ORAL | 2 refills | Status: DC

## 2020-04-06 MED ORDER — CARVEDILOL 12.5 MG PO TABS: 12.5000 mg | ORAL_TABLET | Freq: Two times a day (BID) | ORAL | 3 refills | Status: DC

## 2020-04-06 MED ORDER — LEVOTHYROXINE SODIUM 137 MCG PO TABS: 137.0000 ug | ORAL_TABLET | Freq: Every day | ORAL | 2 refills | Status: DC

## 2020-04-06 MED ORDER — ISOSORBIDE MONONITRATE ER 30 MG PO TB24: 30.0000 mg | ORAL_TABLET | Freq: Every day | ORAL | 2 refills | Status: DC

## 2020-04-06 MED ORDER — SENNOSIDES-DOCUSATE SODIUM 8.6-50 MG PO TABS: 2.0000 | ORAL_TABLET | Freq: Every day | ORAL | 1 refills | Status: AC

## 2020-04-06 MED ORDER — LOSARTAN POTASSIUM-HCTZ 50-12.5 MG PO TABS: 1.0000 | ORAL_TABLET | Freq: Every day | ORAL | 3 refills | Status: DC

## 2020-04-06 MED ORDER — HYDRALAZINE HCL 25 MG PO TABS: 25.0000 mg | ORAL_TABLET | Freq: Two times a day (BID) | ORAL | 3 refills | Status: DC

## 2020-04-06 MED ORDER — PANTOPRAZOLE SODIUM 40 MG PO TBEC: 40.0000 mg | DELAYED_RELEASE_TABLET | Freq: Every day | ORAL | 3 refills | Status: DC

## 2020-04-06 MED ORDER — APIXABAN 5 MG PO TABS: 10.0000 mg | ORAL_TABLET | Freq: Two times a day (BID) | ORAL | Status: DC

## 2020-04-06 MED ORDER — APIXABAN 5 MG PO TABS: 5.0000 mg | ORAL_TABLET | Freq: Two times a day (BID) | ORAL | Status: DC

## 2020-04-06 MED ORDER — ACETAMINOPHEN 325 MG PO TABS: 650.0000 mg | ORAL_TABLET | Freq: Four times a day (QID) | ORAL | 0 refills | Status: DC | PRN

## 2020-04-06 MED ORDER — ROSUVASTATIN CALCIUM 40 MG PO TABS: 40.0000 mg | ORAL_TABLET | Freq: Every day | ORAL | 2 refills | Status: DC

## 2020-04-06 NOTE — Progress Notes (Signed)
[facility] for Heparin Indication: VTE treatment  Allergies  Allergen Reactions  . Codeine Nausea And Vomiting  . Tramadol     Patient Measurements: Height: 5\' 6"  (167.6 cm) Weight: 92.5 kg (204 lb) IBW/kg (Calculated) : 59.3 HEPARIN DW (KG): 79.6  Vital Signs: Temp: 97.8 F (36.6 C) (03/18 0540) BP: 122/44 (03/18 0540) Pulse Rate: 57 (03/18 0540)  Labs: Recent Labs    04/04/20 1339 04/04/20 1444 04/04/20 1629 04/04/20 2208 04/04/20 2208 04/05/20 0535 04/05/20 1631 04/05/20 2140 04/06/20 0602  HGB 11.6*  --   --   --   --  11.5*  --   --  9.8*  HCT 36.4  --   --   --   --  36.6  --   --  31.1*  PLT 167  --   --   --   --  191  --   --  166  HEPARINUNFRC  --   --   --  0.90*   < > 1.01* 0.21* 0.32 0.49  CREATININE 1.19*  --   --   --   --  1.11*  --   --   --   TROPONINIHS  --  36* 64* 137*  --   --   --   --   --    < > = values in this interval not displayed.    Estimated Creatinine Clearance: 44.8 mL/min (A) (by C-G formula based on SCr of 1.11 mg/dL (H)).  Assessment: Patient presented to ED with left arm swelling and HTN. Left upper extremity DVT study is positive for occlusive thrombus involving the left internal jugular vein, left subclavian vein, left axillary vein, left brachial vein, and left basilic vein.  Pt started on heparin per pharmacy.  HL 0.49- therapeutic  Goal of Therapy:  Heparin level 0.3-0.7 units/ml Monitor platelets by anticoagulation protocol: Yes   Plan:  Continue heparin infusion at 950 units/hr Heparin level daily. Continue to monitor H&H and s/s of bleeding.  Margot Ables, PharmD Clinical Pharmacist 04/06/2020 8:25 AM

## 2020-04-06 NOTE — Discharge Instructions (Signed)
-  1) you are taking Eliquis/apixaban which is your blood thinner so Please Avoid ibuprofen/Advil/Aleve/Motrin/Goody Powders/Naproxen/BC powders/Meloxicam/Diclofenac/Indomethacin and other Nonsteroidal anti-inflammatory medications as these will make you more likely to bleed and can cause stomach ulcers, can also cause Kidney problems.   2)Please follow up with [name], MD for repeat CBC and BMP blood test within a week  3) call or return if any concerns about blood in the urine blood in stool or nosebleeds  4)Please follow-up with hematologist/oncologist Dr. Derek Jack, MD as advised to discuss treatment option for recurrent left-sided breast cancer -Address: inside [facility] (4th Floor), [address], [place], [place] [address] Phone: [phone]   Information on my medicine - ELIQUIS (apixaban)  This medication education was reviewed with me or my healthcare representative as part of my discharge preparation.    Why was Eliquis prescribed for you? Eliquis was prescribed to treat blood clots that may have been found in the veins of your legs (deep vein thrombosis) or in your lungs (pulmonary embolism) and to reduce the risk of them occurring again.  What do You need to know about Eliquis ? The starting dose is 10 mg (two 5 mg tablets) taken TWICE daily for the FIRST SEVEN (7) DAYS, then on 04/13/2020 the dose is reduced to ONE 5 mg tablet taken TWICE daily.  Eliquis may be taken with or without food.   Try to take the dose about the same time in the morning and in the evening. If you have difficulty swallowing the tablet whole please discuss with your pharmacist how to take the medication safely.  Take Eliquis exactly as prescribed and DO NOT stop taking Eliquis without talking to the doctor who prescribed the medication.  Stopping may increase your risk of developing a new blood clot.  Refill your prescription before you run out.  After discharge, you should  have regular check-up appointments with your healthcare provider that is prescribing your Eliquis.    What do you do if you miss a dose? If a dose of ELIQUIS is not taken at the scheduled time, take it as soon as possible on the same day and twice-daily administration should be resumed. The dose should not be doubled to make up for a missed dose.  Important Safety Information A possible side effect of Eliquis is bleeding. You should call your healthcare provider right away if you experience any of the following: ? Bleeding from an injury or your nose that does not stop. ? Unusual colored urine (red or dark brown) or unusual colored stools (red or black). ? Unusual bruising for unknown reasons. ? A serious fall or if you hit your head (even if there is no bleeding).  Some medicines may interact with Eliquis and might increase your risk of bleeding or clotting while on Eliquis. To help avoid this, consult your healthcare provider or pharmacist prior to using any new prescription or non-prescription medications, including herbals, vitamins, non-steroidal anti-inflammatory drugs (NSAIDs) and supplements.  This website has more information on Eliquis (apixaban): http://[email or web address].[email or web address]/home

## 2020-04-06 NOTE — Discharge Summary (Signed)
Robin [name], is a 80 y.o. female  DOB November 04, 1940  MRN [id number].  Admission date:  04/04/2020  Admitting Physician  Albertine Patricia, MD  Discharge Date:  04/06/2020   Primary MD  [name], MD  Recommendations for primary care physician for things to follow:   1) you are taking Eliquis/apixaban which is your blood thinner so Please Avoid ibuprofen/Advil/Aleve/Motrin/Goody Powders/Naproxen/BC powders/Meloxicam/Diclofenac/Indomethacin and other Nonsteroidal anti-inflammatory medications as these will make you more likely to bleed and can cause stomach ulcers, can also cause Kidney problems.   2)Please follow up with [name], MD for repeat CBC and BMP blood test within a week  3) call or return if any concerns about blood in the urine blood in stool or nosebleeds  4)Please follow-up with hematologist/oncologist Dr. Derek Jack, MD as advised to discuss treatment option for recurrent left-sided breast cancer -Address: inside [facility] (4th Floor), [place], [place], [place] [address] Phone: [phone]  Admission Diagnosis  Elevated troponin [R77.8] Acute DVT (deep venous thrombosis) (HCC) [I82.409] Acute deep vein thrombosis (DVT) of left upper extremity, unspecified vein (HCC) [I82.622]   Discharge Diagnosis  Elevated troponin [R77.8] Acute DVT (deep venous thrombosis) (HCC) [I82.409] Acute deep vein thrombosis (DVT) of left upper extremity, unspecified vein (HCC) [I82.622]    Principal Problem:   Acute DVT (deep venous thrombosis) ([place]) Active Problems:   Malignant neoplasm of left breast in female, estrogen receptor positive ([place])   Coronary atherosclerosis of native coronary artery   Hypothyroidism   Essential hypertension, benign   Mixed hyperlipidemia   Carotid artery disease ([place])   Mass of left chest wall      Past Medical History:  Diagnosis  Date  . Breast cancer ([place])    Tamoxifen started 2007  . CAD (coronary artery disease)    DES second diagonal 2/12  . Cardiomyopathy    Probable Takotsubo, LVEF 30-35% 2/12  . Carotid artery disease ([place])   . Essential hypertension   . Hepatic steatosis   . Hyperlipidemia   . Hypothyroidism   . NSTEMI (non-ST elevated myocardial infarction) ([place])    2/12  . Pancreas divisum   . Stroke ([place])    (2000) residual left-sided weakness  . Type 2 diabetes mellitus ([place])     Past Surgical History:  Procedure Laterality Date  . ABDOMINAL HYSTERECTOMY    . CAROTID ENDARTERECTOMY     Left  . CHOLECYSTECTOMY    . JOINT REPLACEMENT     Right [facility]- [facility]  . MASTECTOMY     Bilateral       HPI  from the history and physical done on the day of admission:    Robin [name]  is a 80 y.o. female, with past medical history of hypertension, hyperlipidemia, carotid artery stenosis status post endarterectomy, CAD, cardiomyopathy, CVA, hypothyroidism, history of breast cancer , in remission for last 8 years, patient was seen by PCP today, she was noted with elevated blood pressure at her  endocrinologist office today, who recommended her to come to ED for further evaluation, blood pressure was 207/98, patient did complain of left arm swelling for the past week, as well she does report some dyspnea, mainly with exertion, not follow with her cardiologist Dr. Domenic [name] in a while, she denies any chest pain, hemoptysis, calf pain, no fever, no chills, no weight loss. - in ED venous Doppler significant for extensive left upper extremity DVT, CT chest with no evidence of PE, but was significant for of breast cancer with mass and bony metastasis, patient was started on heparin drip, [facility] hospitalist consulted to admit.     Hospital Course:    Brief Summary:- 80 y.o.female,with past medical history of HTN, HLD, PAD-carotid artery stenosis status post endarterectomy, CAD,  HFpEF/chronic grade 1 diastolic dysfunction,h/o CVA, hypothyroidism, history of breast cancer (patient had right breast cancer in 1987 and left breast cancer in 2014 both treated with mastectomies without chemo or radiation--in remission since 2014 -Admitted on 04/04/2020 with left upper extremity DVT and concerns for left sided chest wall mass with metastases concerning for recurrent breast cancer  A/p 1) recurrent stage IV left-sided breast cancer with bony metastasis ------discussed with general surgeon Dr. Aviva Signs also discussed with radiologist Dr. Thornton Papas --Discussed with oncologist Dr. Delton Coombes S/p  biopsy on 04/05/2020, pathology is consistent with adenocarcinoma -Oncology consult appreciated -patient had right breast cancer in 1987 and left breast cancer in 2007 both treated with mastectomies without chemo or radiation--in remission since 2014 -Patient used to follow with Dr. Carl [name] from [place], patient and family currently interested to establish care with oncology here in [place] and to follow with local oncologist--Dr. Delton Coombes -PET scan and further oncology work-up as outpatient  2)Left upper extremity DVT---  treated with IV heparin --Discharged on Eliquis  -Anticipate lifelong anticoagulation given underlying malignancy  3)HTN--- continue amlodipine 10 mg daily, Coreg 0.5 mg twice daily, hydralazine 50 mg 3 times daily and isosorbide -Restart losartan HCTZ with potassium supplementation  4)H/o CAD/PAD--status post prior carotid endarterectomy--okay to stop Plavix as patient will need to be on Eliquis for DVT -Continue aspirin and Crestor as well as Coreg  5) hypothyroidism--stable, continue levothyroxine 137 mcg daily  6) social/ethics--- plan of care discussed with patient and her daughter Robin [name] who is at bedside and patient's other daughter Robin [name] who is on the phone -Patient is full code with full scope of treatment  [place]  home   Disposition: The patient is from: Home  Anticipated d/c is to: Home  Code Status :  -  Code Status: Full Code   Family Communication:   (patient is alert, awake and coherent)  Discussed with daughters   Robin [name] and [place]  Consults  : Radiology/oncology/general surgery  Discharge Condition: stable  Follow UP   Follow-up Information    [name], MD. Schedule an appointment as soon as possible for a visit in 1 week(s).   Specialty: Internal Medicine Why: for CBC and BMP-- Contact information: [address] [place] [address] [phone]        Satira Sark, MD .   Specialty: Cardiology Contact information: [place]  [address] [phone]        Derek Jack, MD. Schedule an appointment as soon as possible for a visit.   Specialty: Hematology Contact information: [place] [address] [phone]              Diet and Activity recommendation:  As advised  Discharge Instructions    Discharge Instructions    Call MD for:  difficulty breathing, headache or visual disturbances   Complete by: As directed    Call MD for:  persistant dizziness or light-headedness   Complete by: As directed    Call MD for:  persistant nausea and vomiting   Complete by: As directed    Call MD for:  severe uncontrolled pain   Complete by: As directed    Call MD for:  temperature >100.4   Complete by: As directed    Diet - low sodium heart healthy   Complete by: As directed    Discharge instructions   Complete by: As directed    1) you are taking Eliquis/apixaban which is your blood thinner so Please Avoid ibuprofen/Advil/Aleve/Motrin/Goody Powders/Naproxen/BC powders/Meloxicam/Diclofenac/Indomethacin and other Nonsteroidal anti-inflammatory medications as these will make you more likely to bleed and can cause stomach ulcers, can also cause Kidney problems.   2)Please follow up  with [name], MD for repeat CBC and BMP blood test within a week  3) call or return if any concerns about blood in the urine blood in stool or nosebleeds  4)Please follow-up with hematologist/oncologist Dr. Derek Jack, MD as advised to discuss treatment option for recurrent left-sided breast cancer -Address: inside [facility] (4th Floor), [place], [place], [place] [address] Phone: [phone]   Increase activity slowly   Complete by: As directed         Discharge Medications     Allergies as of 04/06/2020      Reactions   Codeine Nausea And Vomiting   Tramadol       Medication List    STOP taking these medications   clopidogrel 75 MG tablet Commonly known as: PLAVIX   docusate sodium 100 MG capsule Commonly known as: COLACE   furosemide 20 MG tablet Commonly known as: LASIX     TAKE these medications   acetaminophen 325 MG tablet Commonly known as: TYLENOL Take 2 tablets (650 mg total) by mouth every 6 (six) hours as needed for mild pain, fever or headache (or Fever >/= 101).   amLODipine 10 MG tablet Commonly known as: NORVASC Take 1 tablet (10 mg total) by mouth daily.   aspirin 81 MG tablet Take 1 tablet (81 mg total) by mouth daily with breakfast. What changed: when to take this   carvedilol 12.5 MG tablet Commonly known as: COREG Take 1 tablet (12.5 mg total) by mouth 2 (two) times daily with a meal. What changed: See the new instructions.   Eliquis DVT/PE Starter Pack Generic drug: Apixaban Starter Pack (74m and 562m Take as directed on package: start with two-37m43mablets twice daily for 7 days. On day 8, switch to one-37mg26mblet twice daily.   apixaban 5 MG Tabs tablet Commonly known as: ELIQUIS Take 1 tablet (5 mg total) by mouth 2 (two) times daily. Start after you complete the initial starter pack of Eliquis Start taking on: May 06, 2020   gabapentin 100 MG capsule Commonly known as: NEURONTIN Take 1 capsule (100  mg total) by mouth 3 (three) times daily.   hydrALAZINE 25 MG tablet Commonly known as: APRESOLINE Take 1 tablet (25 mg total) by mouth in the morning and at bedtime.   isosorbide mononitrate 30 MG 24 hr tablet Commonly known as: IMDUR Take 1 tablet (30 mg total) by mouth daily. Start taking on: April 07, 2020   levothyroxine 137 MCG tablet  Commonly known as: Synthroid Take 1 tablet (137 mcg total) by mouth daily before breakfast.   losartan-hydrochlorothiazide 50-12.5 MG tablet Commonly known as: HYZAAR Take 1 tablet by mouth daily.   nitroGLYCERIN 0.4 MG SL tablet Commonly known as: NITROSTAT Place 1 tablet (0.4 mg total) under the tongue every 5 (five) minutes x 3 doses as needed.   Omega-3 Fatty Acids 300 MG Caps Take 1 capsule (300 mg total) by mouth 2 (two) times daily.   pantoprazole 40 MG tablet Commonly known as: PROTONIX Take 1 tablet (40 mg total) by mouth daily. Start taking on: April 07, 2020 What changed:   how much to take  how to take this  when to take this   potassium chloride 10 MEQ tablet Commonly known as: KLOR-CON Take 1 tablet (10 mEq total) by mouth daily. Take While taking Lasix/furosemide   rosuvastatin 40 MG tablet Commonly known as: CRESTOR Take 1 tablet (40 mg total) by mouth daily.   senna-docusate 8.6-50 MG tablet Commonly known as: Senokot-S Take 2 tablets by mouth at bedtime.   Vitamin D3 125 MCG (5000 UT) Caps TAKE 1 CAPSULE BY MOUTH ONCE DAILY       Major procedures and Radiology Reports - PLEASE review detailed and final reports for all details, in brief -    CT Angio Chest PE W and/or Wo Contrast  Result Date: 04/04/2020 CLINICAL DATA:  80 year old female with shortness of breath for the past 2 weeks, worsening over the past 2-3 days. Evaluate for pulmonary embolism. History of breast cancer status post bilateral mastectomy in 2007. EXAM: CT ANGIOGRAPHY CHEST WITH CONTRAST TECHNIQUE: Multidetector CT imaging of the  chest was performed using the standard protocol during bolus administration of intravenous contrast. Multiplanar CT image reconstructions and MIPs were obtained to evaluate the vascular anatomy. CONTRAST:  27m OMNIPAQUE IOHEXOL 350 MG/ML SOLN COMPARISON:  No priors. FINDINGS: Comment: Today's study is limited by suboptimal contrast bolus and extends patient respiratory motion. Cardiovascular: Within the limitations of today's examination, there is no central, lobar or proximal segmental sized pulmonary embolism. Distal segmental and subsegmental sized pulmonary embolism cannot be entirely excluded on the basis of today's limited examination. Heart size is mildly enlarged. There is no significant pericardial fluid, thickening or pericardial calcification. There is aortic atherosclerosis, as well as atherosclerosis of the great vessels of the mediastinum and the coronary arteries, including calcified atherosclerotic plaque in the left main, left anterior descending, left circumflex and right coronary arteries. Mediastinum/Nodes: No pathologically enlarged mediastinal or hilar lymph nodes. Esophagus is unremarkable in appearance. No axillary lymphadenopathy. Lungs/Pleura: Patchy areas of mild ground-glass attenuation and interlobular septal thickening are noted throughout the lungs bilaterally. No confluent consolidative airspace disease. No pleural effusions. No definite suspicious appearing pulmonary nodules or masses are noted. Upper Abdomen: Aortic atherosclerosis. Musculoskeletal: In the left chest wall there is an area of soft tissue architectural distortion measuring 4.5 x 3.1 cm (axial image 68 of series 4), concerning for tumor recurrence in this patient status post bilateral modified radical mastectomy for breast cancer. Multiple lytic lesions are noted in the visualized skeleton, most evident in the T1 and T3 vertebral bodies, as well as the manubrium, concerning for potential metastatic lesions. Review of  the MIP images confirms the above findings. IMPRESSION: 1. Limited study demonstrating no central, lobar, or proximal segmental sized pulmonary embolism. Smaller distal segmental and subsegmental sized emboli cannot be entirely excluded. 2. Mild cardiomegaly with evidence of mild interstitial pulmonary edema in the lungs; imaging  findings suggestive of congestive heart failure. 3. Irregular masslike area in the left chest wall concerning for potential recurrent breast neoplasm measuring approximately 4.5 x 3.1 cm. There also lytic lesions in T1 and T3 vertebral bodies as well as the manubrium. The possibility of metastatic disease should be considered. Further evaluation with PET-CT should be considered. 4. Aortic atherosclerosis, in addition to left main and 3 vessel coronary artery disease. Aortic Atherosclerosis (ICD10-I70.0). Electronically Signed   By: Vinnie Langton M.D.   On: 04/04/2020 17:47   MR Cervical Spine Wo Contrast  Addendum Date: 03/13/2020   ADDENDUM REPORT: 03/13/2020 10:57 ADDENDUM: Also under the impression: Mildly STIR hyperintense lesions measuring up to 1.0 cm at the T1 and T3 levels may reflect atypical hemangiomata. Consider postcontrast MRI if suspicion persists. Electronically Signed   By: Primitivo Gauze M.D.   On: 03/13/2020 10:57   Result Date: 03/13/2020 CLINICAL DATA:  Cervical radiculopathy, no red flags EXAM: MRI CERVICAL SPINE WITHOUT CONTRAST TECHNIQUE: Multiplanar, multisequence MR imaging of the cervical spine was performed. No intravenous contrast was administered. COMPARISON:  03/01/2020. FINDINGS: Alignment: Straightening of lordosis. Grade 1 C6-7 retrolisthesis. Grade 1 C7-T1 anterolisthesis. Vertebrae: Vertebral body heights are preserved. Multilevel Modic type 2 endplate degenerative changes. T1/T2 hypointense lesion underlying the superior T1 endplate with mild STIR hyperintensity measuring 1.0 cm. Similar signal intensity lesion measuring 0.7 cm at the T3  level. Cord: Normal signal and morphology. Posterior Fossa, vertebral arteries: Negative. Disc levels: Multilevel desiccation and disc space loss. C2-3: Disc osteophyte complex with uncovertebral and facet degenerative spurring. Patent spinal canal. Mild bilateral neural foraminal narrowing. C3-4: Disc osteophyte complex with superimposed left subarticular protrusion. Uncovertebral and facet hypertrophy. Mild spinal canal and bilateral neural foraminal narrowing. C4-5: Disc osteophyte complex with right subarticular protrusion. Uncovertebral and facet hypertrophy. Mild spinal canal and bilateral neural foraminal narrowing. C5-6: Disc osteophyte complex with uncovertebral and facet hypertrophy. Mild spinal canal and bilateral neural foraminal narrowing. C6-7: Disc osteophyte complex with shallow left paracentral protrusion. Uncovertebral and facet hypertrophy. Moderate spinal canal, mild right and moderate left neural foraminal narrowing. C7-T1: No significant disc bulge. Uncovertebral and bilateral facet hypertrophy. 6 mm right perineural cyst. Patent spinal canal and neural foramen. Paraspinal tissues: 4 mm right T1-2 perineural cyst. Laterally oriented 8 mm synovial cyst arising from the right facet joint at C2-3 level. IMPRESSION: Multilevel spondylosis. Mild C3-6 and moderate C6-7 spinal canal narrowing. Mild bilateral C2-C6 and right C6-7 neural foraminal narrowing. Moderate left C6-7 neural foraminal narrowing. Electronically Signed: By: Primitivo Gauze M.D. On: 03/13/2020 10:28   US Venous Img Upper Left (DVT Study)  Addendum Date: 04/04/2020   ADDENDUM REPORT: 04/04/2020 14:45 ADDENDUM: These results were called by telephone at the time of interpretation on 04/04/2020 at 2:18 pm to provider GARRETT GREEN , who verbally acknowledged these results. Electronically Signed   By: Markus Daft M.D.   On: 04/04/2020 14:45   Result Date: 04/04/2020 CLINICAL DATA:  80 year old with left arm swelling. History of  breast cancer. EXAM: LEFT UPPER EXTREMITY VENOUS DOPPLER ULTRASOUND TECHNIQUE: Gray-scale sonography with graded compression, as well as color Doppler and duplex ultrasound were performed to evaluate the upper extremity deep venous system from the level of the subclavian vein and including the jugular, axillary, basilic, radial, ulnar and upper cephalic vein. Spectral Doppler was utilized to evaluate flow at rest and with distal augmentation maneuvers. COMPARISON:  None. FINDINGS: Contralateral Subclavian Vein:  Patent with color Doppler flow. Internal Jugular Vein: Positive for thrombus. Echogenic  thrombus in the left internal jugular vein. This appears to represent occlusive thrombus. Subclavian Vein: Positive for thrombus. Evidence for echogenic occlusive thrombus in the left subclavian vein. Axillary Vein: Positive for thrombus. Expansion of left axillary vein with echogenic occlusive thrombus. Cephalic Vein: No evidence of thrombus. Normal compressibility and color Doppler flow. Basilic Vein: Positive for thrombus. Left basilic vein is non compressible and no significant flow. Brachial Veins: Positive for thrombus involving at least one left brachial vein. This is compatible with occlusive thrombus. Radial Veins: No evidence of thrombus.  Normal compressibility. Ulnar Veins: No evidence of thrombus. Normal compressibility. Limited evaluation. Subcutaneous edema in this area. Other Findings:  Subcutaneous edema near the wrist. IMPRESSION: Positive for deep venous thrombosis in the left upper extremity. Occlusive thrombus involving the left internal jugular vein, left subclavian vein, left axillary vein, left brachial vein and left basilic vein. Electronically Signed: By: Markus Daft M.D. On: 04/04/2020 14:12   [place] CORE BIOPSY (SOFT TISSUE)  Result Date: 04/05/2020 CLINICAL DATA:  LEFT chest wall mass by CT. Scarring at LEFT chest wall secondary to prior LEFT breast cancer post BILATERAL mastectomy and LEFT  axillary node dissection. EXAM: US GUIDED CORE BIOPSY OF LEFT CHEST WALL MASS COMPARISON:  Ultrasound LEFT chest wall 04/05/2020, CT angio chest 04/04/2020 PROCEDURE: I met with the patient and we discussed the procedure of ultrasound-guided biopsy, including benefits and alternatives. We discussed the high likelihood of a successful procedure. We discussed the risks of the procedure, including infection and bleeding. Informed written consent was given. The usual time-out protocol was performed immediately prior to the procedure. Using sterile technique and 1% Lidocaine as local anesthetic, under direct ultrasound visualization, 18 gauge and 14 gauge spring-loaded devices were used to perform biopsy of LEFT chest wall mass using a inferior to superior approach. Procedure tolerated well by patient without immediate complication. IMPRESSION: Ultrasound guided core biopsies of LEFT chest wall mass. No immediate complication. Electronically Signed   By: Lavonia [place] M.D.   On: 04/05/2020 14:50   [place] CHEST SOFT TISSUE  Result Date: 04/05/2020 CLINICAL DATA:  Abnormal CT chest, LEFT chest wall soft tissue mass, past history of BILATERAL mastectomy, LEFT breast cancer and axillary node dissection EXAM: ULTRASOUND OF CHEST SOFT TISSUES TECHNIQUE: Ultrasound examination of the chest wall soft tissues was performed in the area of clinical concern. COMPARISON:  CT angio chest 04/04/2020 FINDINGS: At the site of clinical concern, and irregular firm hypoechoic deep subcutaneous soft tissue nodule is identified which measures approximately 4.0 x 1.5 x 3.3 cm in size. Abnormality corresponds to the irregular soft tissue mass seen on CT and is concerning for chest wall recurrence of tumor. Mass appears hypovascular. No definite fluid collections or calcifications. IMPRESSION: 4.0 cm diameter deep subcutaneous soft tissue mass of the LEFT chest wall corresponding to CT abnormality, question tumor recurrence of the chest wall.  Lesion is amenable to percutaneous ultrasound-guided core biopsy. Electronically Signed   By: Lavonia [place] M.D.   On: 04/05/2020 12:17    Micro Results    Recent Results (from the past 240 hour(s))  SARS CORONAVIRUS 2 (TAT 6-24 HRS) Nasopharyngeal Nasopharyngeal Swab     Status: None   Collection Time: 04/04/20  5:01 PM   Specimen: Nasopharyngeal Swab  Result Value Ref Range Status   SARS Coronavirus 2 NEGATIVE NEGATIVE Final    Comment: (NOTE) SARS-CoV-2 target nucleic acids are NOT DETECTED.  The SARS-CoV-2 RNA is generally detectable in upper and lower respiratory specimens during  the acute phase of infection. Negative results do not preclude SARS-CoV-2 infection, do not rule out co-infections with other pathogens, and should not be used as the sole basis for treatment or other patient management decisions. Negative results must be combined with clinical observations, patient history, and epidemiological information. The expected result is Negative.  Fact Sheet for Patients: SugarRoll.be  Fact Sheet for Healthcare Providers: https://www.woods-mathews.com/  This test is not yet approved or cleared by the [place] FDA and  has been authorized for detection and/or diagnosis of SARS-CoV-2 by FDA under an Emergency Use Authorization (EUA). This EUA will remain  in effect (meaning this test can be used) for the duration of the COVID-19 declaration under Se ction 564(b)(1) of the Act, 21 U.S.C. section 360bbb-3(b)(1), unless the authorization is terminated or revoked sooner.  Performed at [facility], [place] [address] [place], [place] [address]     Today   Subjective    Nathali Vent today has no new complaints        -Left arm swelling and pain is improved -Patient's daughter Robin [name] at bedside, questions answered -No chest pains no dyspnea   Patient has been seen and examined prior to discharge   Objective    Blood pressure (!) 136/54, pulse 64, temperature 97.8 F (36.6 C), temperature source Oral, resp. rate 14, height _0  (1.676 m), weight 92.5 kg, SpO2 99 %.   Intake/Output Summary (Last 24 hours) at 04/06/2020 1246 Last data filed at 04/06/2020 0329 Gross per 24 hour  Intake 148.93 ml  Output --  Net 148.93 ml    Exam Gen:- Awake Alert,  In no apparent distress  HEENT:- [place].AT, No sclera icterus Neck-Supple Neck,No JVD,.  Lungs-  CTAB , fair symmetrical air movement Breast-scars from bilateral mastectomy CV- S1, S2 normal, regular  Abd-  +ve B.Sounds, Abd Soft, No tenderness,    Extremity/Skin:  pedal pulses present, improving left upper extremity swelling Psych-affect is appropriate, oriented x3 Neuro-no new focal deficits, no tremors   Data Review   CBC w Diff:  Lab Results  Component Value Date   WBC 8.1 04/06/2020   HGB 9.8 (L) 04/06/2020   HCT 31.1 (L) 04/06/2020   PLT 166 04/06/2020   LYMPHOPCT 12 04/04/2020   MONOPCT 7 04/04/2020   EOSPCT 2 04/04/2020   BASOPCT 0 04/04/2020    CMP:  Lab Results  Component Value Date   NA 139 04/05/2020   K 3.2 (L) 04/05/2020   CL 103 04/05/2020   CO2 23 04/05/2020   BUN 21 04/05/2020   CREATININE 1.11 (H) 04/05/2020   CREATININE 1.16 (H) 03/01/2020   PROT 7.5 04/04/2020   ALBUMIN 3.9 04/04/2020   BILITOT 0.9 04/04/2020   ALKPHOS 100 04/04/2020   AST 29 04/04/2020   ALT 14 04/04/2020  .   Total Discharge time is about 33 minutes  Roxan Hockey M.D on 04/06/2020 at 12:46 PM  Go to [email or web address] -  for contact info  [facility] - Office  [phone]

## 2020-04-06 NOTE — Progress Notes (Signed)
[facility] for Heparin >> Eliquis Indication: VTE treatment  Allergies  Allergen Reactions  . Codeine Nausea And Vomiting  . Tramadol     Patient Measurements: Height: 5\' 6"  (167.6 cm) Weight: 92.5 kg (204 lb) IBW/kg (Calculated) : 59.3 HEPARIN DW (KG): 79.6  Vital Signs: Temp: 97.8 F (36.6 C) (03/18 0952) Temp Source: Oral (03/18 0952) BP: 136/54 (03/18 0952) Pulse Rate: 64 (03/18 0952)  Labs: Recent Labs    04/04/20 1339 04/04/20 1444 04/04/20 1629 04/04/20 2208 04/04/20 2208 04/05/20 0535 04/05/20 1631 04/05/20 2140 04/06/20 0602  HGB 11.6*  --   --   --   --  11.5*  --   --  9.8*  HCT 36.4  --   --   --   --  36.6  --   --  31.1*  PLT 167  --   --   --   --  191  --   --  166  HEPARINUNFRC  --   --   --  0.90*   < > 1.01* 0.21* 0.32 0.49  CREATININE 1.19*  --   --   --   --  1.11*  --   --   --   TROPONINIHS  --  36* 64* 137*  --   --   --   --   --    < > = values in this interval not displayed.    Estimated Creatinine Clearance: 44.8 mL/min (A) (by C-G formula based on SCr of 1.11 mg/dL (H)).  Assessment: Patient presented to ED with left arm swelling and HTN. Left upper extremity DVT study is positive for occlusive thrombus involving the left internal jugular vein, left subclavian vein, left axillary vein, left brachial vein, and left basilic vein.  Pt started on heparin per pharmacy.  Goal of Therapy:  Heparin level 0.3-0.7 units/ml Monitor platelets by anticoagulation protocol: Yes   Plan:  Stop heparin infusion. Start apixaban 10 mg twice daily for 7 days followed by apixaban 5 mg twice daily. Continue to monitor H&H and s/s of bleeding.  Margot Ables, PharmD Clinical Pharmacist 04/06/2020 12:41 PM

## 2020-04-09 ENCOUNTER — Telehealth: Payer: Self-pay

## 2020-04-09 NOTE — Telephone Encounter (Signed)
Transition Care Management Follow-up Telephone Call  Date of discharge and from where: 04/06/20- [facility]   How have you been since you were released from the hospital? Feeling pretty good  Any questions or concerns? No  Items Reviewed:  Did the pt receive and understand the discharge instructions provided? Yes   Medications obtained and verified? Yes   Other? No   Any new allergies since your discharge? No   Dietary orders reviewed? Yes  Do you have support at home? Yes granddaughter in home and 2 nurses in family checking in on her  [place] and Equipment/Supplies: Were home health services ordered? no If so, what is the name of the agency? n/a  Has the agency set up a time to come to the patient's home? not applicable Were any new equipment or medical supplies ordered?  No What is the name of the medical supply agency? n/a Were you able to get the supplies/equipment? not applicable Do you have any questions related to the use of the equipment or supplies? No  Functional Questionnaire: (I = Independent and D = Dependent) ADLs: needs some help from grand daughter  Bathing/Dressing- I  Meal Prep- D  Eating- I  Maintaining continence- I  Transferring/Ambulation- I uses walker  Managing Meds- I- granddaughter assists  Follow up appointments reviewed:   PCP Hospital f/u appt confirmed? Yes  Scheduled to see Dr Posey Pronto on 04/11/20 @ 9:00.  [facility] f/u appt confirmed? Yes  Scheduled to see [facility] on 04/12/20 @ 2:30, Dr Delton Coombes 04/26/20 11:30.  Are transportation arrangements needed? No   If their condition worsens, is the pt aware to call PCP or go to the Emergency Dept.? Yes  Was the patient provided with contact information for the PCP's office or ED? Yes  Was to pt encouraged to call back with questions or concerns? Yes

## 2020-04-11 ENCOUNTER — Telehealth: Payer: Self-pay

## 2020-04-11 ENCOUNTER — Ambulatory Visit (INDEPENDENT_AMBULATORY_CARE_PROVIDER_SITE_OTHER): Payer: Medicare PPO | Admitting: Internal Medicine

## 2020-04-11 ENCOUNTER — Other Ambulatory Visit: Payer: Self-pay

## 2020-04-11 ENCOUNTER — Encounter: Payer: Self-pay | Admitting: Internal Medicine

## 2020-04-11 VITALS — BP 173/86 | HR 64 | Resp 18 | Ht 66.0 in

## 2020-04-11 DIAGNOSIS — I10 Essential (primary) hypertension: Secondary | ICD-10-CM | POA: Diagnosis not present

## 2020-04-11 DIAGNOSIS — I82622 Acute embolism and thrombosis of deep veins of left upper extremity: Secondary | ICD-10-CM

## 2020-04-11 DIAGNOSIS — Z09 Encounter for follow-up examination after completed treatment for conditions other than malignant neoplasm: Secondary | ICD-10-CM | POA: Diagnosis not present

## 2020-04-11 NOTE — Telephone Encounter (Signed)
FMLA for Daughter [name] Noted Copied Sleeved

## 2020-04-11 NOTE — Progress Notes (Unsigned)
Cardiology Office Note  Date: 04/12/2020   ID: Robin, [name] Feb 14, 1940, MRN [id number]  PCP:  [name], MD  Cardiologist:  Rozann Lesches, MD Electrophysiologist:  None   Chief Complaint: Hospital follow up DVT  History of Present Illness: Robin [name] is a 80 y.o. female with a history of cardiomyopathy, CAD, carotid artery disease, HTN, HLD, hypothyroidism, hepatic steatosis, breast CA, CVA, DM   Last encounter with [name] via telemedicine 04/25/2019. She did not report any anginal symptoms or nitroglycerin use. Stable NYHA class II dyspnea with basic ADLs. Follow-up echocardiogram was pending. She preferred to hold off on getting this done. Planned to repeat study prior to next office visit. Medications were reviewed. Aspirin, Norvasc, Coreg, Plavix, Hyzaar, Crestor. Nitroglycerin sublingual available. Echo was ordered prior to next 45-monthvisit. Systolic blood pressure was in the 140s. She was to continue Norvasc, Coreg, and Hyzaar. CAD status post DES to second diagonal 2012. Continue observation and medical therapy.   Recent hospital admission on 04/04/2020.  She had been seen by PCP and endocrinologist that day who recommended she go to the emergency room for elevated blood pressure of 207/98.  Complained of left arm swelling for the prior week.  Reported some dyspnea mainly with exertion.  In ED venous Doppler significant for extensive left upper extremity DVT.  CT chest with no evidence of PE but significant for breast cancer with mass and bony metastasis.  She was status post biopsy on 04/05/2020 with pathology consistent with adenocarcinoma.  Her left upper extremity DVT was treated with IV heparin and she was discharged on Eliquis in anticipation of lifelong anticoagulation given underlying malignancy.  She was continuing amlodipine, Coreg, hydralazine, Imdur. Losartan/HCTZ was restarted with potassium supplementation.  Continuing aspirin and Crestor.   She  is here for follow-up from recent hospitalization.  She is complaining of increasing dyspnea on exertion with weight gain and lower extremity edema.  Blood pressure has improved but continues to be elevated at 148/70.  Limited options for medication changes due to decreased renal function orthostatic symptoms when hydralazine has been at increased doses.  She brings with her a log of her blood pressures which shows systolics primarily in the 120s to 130s over 70s to 80s.  Denies any CVA or TIA-like symptoms, palpitations or arrhythmias, orthostatic symptoms.  No bleeding issues on Eliquis.  Left arm continues to be swollen secondary to DVT.  She has an upcoming visit with oncology for recurrence of breast CA.      Past Medical History:  Diagnosis Date  . Breast cancer ([facility]    Tamoxifen started 2007  . CAD (coronary artery disease)    DES second diagonal 2/12  . Cardiomyopathy    Probable Takotsubo, LVEF 30-35% 2/12  . Carotid artery disease ([place]   . Essential hypertension   . Hepatic steatosis   . Hyperlipidemia   . Hypothyroidism   . NSTEMI (non-ST elevated myocardial infarction) ([facility]    2/12  . Pancreas divisum   . Stroke ([facility]    (2000) residual left-sided weakness  . Type 2 diabetes mellitus ([place]     Past Surgical History:  Procedure Laterality Date  . ABDOMINAL HYSTERECTOMY    . CAROTID ENDARTERECTOMY     Left  . CHOLECYSTECTOMY    . JOINT REPLACEMENT     Right knee replacement- Dr. KSebastian AcheVA  . MASTECTOMY     Bilateral    Current Outpatient Medications  Medication Sig  Dispense Refill  . acetaminophen (TYLENOL) 325 MG tablet Take 2 tablets (650 mg total) by mouth every 6 (six) hours as needed for mild pain, fever or headache (or Fever >/= 101). 12 tablet 0  . amLODipine (NORVASC) 10 MG tablet Take 1 tablet (10 mg total) by mouth daily. 90 tablet 3  . [START ON 05/06/2020] apixaban (ELIQUIS) 5 MG TABS tablet Take 1 tablet (5 mg total) by mouth 2 (two) times  daily. Start after you complete the initial starter pack of Eliquis 60 tablet 5  . Apixaban Starter Pack, 32m and 53m (ELIQUIS DVT/PE STARTER PACK) Take as directed on package: start with two-[id number] twice daily for 7 days. On day 8, switch to one-68mg368mblet twice daily. 1 each 0  . aspirin 81 MG tablet Take 1 tablet (81 mg total) by mouth daily with breakfast. 30 tablet 3  . carvedilol (COREG) 12.5 MG tablet Take 1 tablet (12.5 mg total) by mouth 2 (two) times daily with a meal. 90 tablet 3  . Cholecalciferol (VITAMIN D3) 125 MCG (5000 UT) CAPS TAKE 1 CAPSULE BY MOUTH ONCE DAILY (Patient taking differently: Take 1 capsule by mouth daily.) 90 capsule 1  . gabapentin (NEURONTIN) 100 MG capsule Take 1 capsule (100 mg total) by mouth 3 (three) times daily. 90 capsule 2  . hydrALAZINE (APRESOLINE) 25 MG tablet Take 1 tablet (25 mg total) by mouth in the morning and at bedtime. 180 tablet 3  . isosorbide mononitrate (IMDUR) 30 MG 24 hr tablet Take 1 tablet (30 mg total) by mouth daily. 90 tablet 2  . levothyroxine (SYNTHROID) 137 MCG tablet Take 1 tablet (137 mcg total) by mouth daily before breakfast. 90 tablet 2  . losartan-hydrochlorothiazide (HYZAAR) 50-12.5 MG tablet Take 1 tablet by mouth daily. 90 tablet 3  . nitroGLYCERIN (NITROSTAT) 0.4 MG SL tablet Place 1 tablet (0.4 mg total) under the tongue every 5 (five) minutes x 3 doses as needed. 25 tablet 3  . Omega-3 Fatty Acids 300 MG CAPS Take 1 capsule (300 mg total) by mouth 2 (two) times daily.    . pantoprazole (PROTONIX) 40 MG tablet Take 1 tablet (40 mg total) by mouth daily. 30 tablet 3  . potassium chloride (KLOR-CON) 10 MEQ tablet Take 1 tablet (10 mEq total) by mouth daily. Take While taking Lasix/furosemide 30 tablet 2  . rosuvastatin (CRESTOR) 40 MG tablet Take 1 tablet (40 mg total) by mouth daily. 90 tablet 2  . senna-docusate (SENOKOT-S) 8.6-50 MG tablet Take 2 tablets by mouth at bedtime. 60 tablet 1   No current  facility-administered medications for this visit.   Allergies:  Codeine and Tramadol   Social History: The patient  reports that she has never smoked. She has never used smokeless tobacco. She reports that she does not drink alcohol and does not use drugs.   Family History: The patient's family history includes Heart attack in her brother and sister; Heart disease in her brother and sister.   ROS:  Please see the history of present illness. Otherwise, complete review of systems is positive for none.  All other systems are reviewed and negative.   Physical Exam: VS:  BP (!) 148/70   Pulse 64   Ht _0  (1.676 m)   Wt 224 lb 9.6 oz (101.9 kg)   SpO2 96%   BMI 36.25 kg/m , BMI Body mass index is 36.25 kg/m.  Wt Readings from Last 3 Encounters:  04/12/20 224 lb 9.6 oz (101.9 kg)  04/04/20 204 lb (92.5 kg)  04/04/20 220 lb (99.8 kg)    General: Patient appears comfortable at rest. Neck: Supple, no elevated JVP, mild carotid bruits bilaterally.  Evidence of previous CEA left side, no thyromegaly. Lungs: Clear to auscultation, nonlabored breathing at rest. Cardiac: Regular rate and rhythm, no S3, mild systolic murmur heard at right upper and left upper sternal border, no pericardial rub. Extremities: Mild non-pitting edema, distal pulses 2+. Skin: Warm and dry. Musculoskeletal: No kyphosis. Neuropsychiatric: Alert and oriented x3, affect grossly appropriate.  ECG:  EKG June 28, 2019 sinus rhythm rate of 62, borderline T abnormalities, inferior leads, prolonged QT with QT C/QTc 541/550 ms  Recent Labwork: 03/29/2020: TSH 10.600 04/04/2020: ALT 14; AST 29; B Natriuretic Peptide 92.0 04/11/2020: BUN 49; Creatinine, Ser 1.65; Hemoglobin 10.3; Platelets 192; Potassium 4.0; Sodium 141     Component Value Date/Time   CHOL 145 03/01/2020 1008   TRIG 109 03/01/2020 1008   HDL 46 (L) 03/01/2020 1008   CHOLHDL 3.2 03/01/2020 1008   VLDL 20 04/08/2016 0910   LDLCALC 80 03/01/2020 1008     Other Studies Reviewed Today:   CT angio chest per PE protocol 04/04/2020 IMPRESSION: 1. Limited study demonstrating no central, lobar, or proximal segmental sized pulmonary embolism. Smaller distal segmental and subsegmental sized emboli cannot be entirely excluded. 2. Mild cardiomegaly with evidence of mild interstitial pulmonary edema in the lungs; imaging findings suggestive of congestive heart failure. 3. Irregular masslike area in the left chest wall concerning for potential recurrent breast neoplasm measuring approximately 4.5 x 3.1 cm. There also lytic lesions in T1 and T3 vertebral bodies as well as the manubrium. The possibility of metastatic disease should be considered. Further evaluation with PET-CT should be considered. 4. Aortic atherosclerosis, in addition to left main and 3 vessel coronary artery disease.  Aortic Atherosclerosis (ICD10-I70.0).   Echocardiogram 10/27/2019  1. Left ventricular ejection fraction, by estimation, is 60 to 65%. The left ventricle has normal function. The left ventricle has no regional wall motion abnormalities. There is mild left ventricular hypertrophy. Left ventricular diastolic parameters are consistent with Grade I diastolic dysfunction (impaired relaxation). Elevated left atrial pressure. 2. Right ventricular systolic function is normal. The right ventricular size is normal. 3. Left atrial size was mildly dilated. 4. The mitral valve is normal in structure. Trivial mitral valve regurgitation. No evidence of mitral stenosis. 5. The aortic valve is tricuspid. There is mild calcification of the aortic valve. There is mild thickening of the aortic valve. Aortic valve regurgitation is not visualized. No aortic stenosis is present. 6. The inferior vena cava is normal in size with greater than 50% respiratory variability, suggesting right atrial pressure of 3 mmHg. Comparison(s): Echocardiogram done 04/27/15 showed an EF of  60-65%.   Echocardiogram 04/27/2015: Study Conclusions  - Left ventricle: The cavity size was normal. Wall thickness was increased in a pattern of severe LVH. Systolic function was normal. The estimated ejection fraction was in the range of 60% to 65%. Wall motion was normal; there were no regional wall motion abnormalities. Doppler parameters are consistent with abnormal left ventricular relaxation (grade 1 diastolic dysfunction). Doppler parameters are consistent with high ventricular filling pressure. - Aortic valve: Mildly calcified annulus. Trileaflet; mildly calcified leaflets. - Mitral valve: Calcified annulus. There was trivial regurgitation. - Left atrium: The atrium was mildly dilated. - Right atrium: Central venous pressure (est): 3 mm Hg. - Tricuspid valve: There was trivial regurgitation. - Pulmonary arteries: PA peak pressure: 26 mm  Hg (S). - Pericardium, extracardiac: There was no pericardial effusion.  Impressions:  - Severe LVH with LVEF 60-65%. Grade 1 diastolic dysfunction with increased LV filling pressures. Mild left atrial enlargement. MAC with trivial mitral regurgitation. Mildly sclerotic aortic valve. Trivial tricuspid regurgitation with PASP 26 mmHg mmHg.  Assessment and Plan:  1. Secondary cardiomyopathy ([place])   2. Essential hypertension, benign   3. CAD in native artery   4. Bilateral carotid artery disease, unspecified type ([place])   5. Acute deep vein thrombosis (DVT) of other vein of left upper extremity (HCC)   6. Malignant neoplasm of left breast in female, estrogen receptor positive, unspecified site of breast ([place])    1. Secondary cardiomyopathy ([place]) Echocardiogram 10/27/2019: EF 60 to 65%, mild LVH.  This has improved since previous echo in 2017 which showed severe LVH.  G1 DD unchanged from last echo in 2017.  Trivial mitral regurgitation unchanged from previous echo 2017.   We are getting a repeat echocardiogram for  increase shortness of breath/DOE, weight gain, lower extremity edema as noted in #7 below   2. Essential hypertension, benign Blood pressure today 148/70.  She brings with her a log of blood pressures which show systolic blood pressures 518A to 416S and diastolic blood pressures of 70s to 80s. Continue Coreg 12.5 mg p.o. twice daily, losartan/hydrochlorothiazide 50/12.5 mg daily.  Continue Lasix 20 mg as needed.  Continue amlodipine 10 mg daily.  3. CAD in native artery No anginal or exertional symptoms or use of nitroglycerin.  Continue aspirin 81 mg daily.  Continue nitroglycerin 0.4 mg sublingual as needed.  4.  Carotid artery disease. Patient has bilateral carotid artery bruits. Previous carotid artery study 2018 heterogeneous plaque with shadowing bilaterally.  1 to 39% ICA stenosis bilaterally.  Greater than 50% stenosis of the right ECA.  Patent left endarterectomy site.  Normal subclavian arteries, bilaterally.  Patent vertebral arteries with antegrade flow.   She has no neurological symptoms.  No CVA or TIA-like symptoms.  5. DVT left upper extremity Recent DVT of left upper extremity.  Placed on Eliquis starter pack.  Continue Eliquis as directed by starter pack.  Then Eliquis 5 mg p.o. twice daily  6. Breast Cancer left breast / Recurrent Has a recurrence of left breast CA.  She has a pending appointment with oncology.  7.  Shortness of breath  Complaining of recent increase in DOE/shortness of breath with weight gain.Marland Kitchen  Please get a repeat echocardiogram.  Start Lasix 20 mg daily for 7 days.  Weigh yourself daily.  Continue with potassium supplementation.  Come back in 1 week for nurse visit for weight check and blood pressure check.  Get a basic metabolic and magnesium prior to follow-up in 2 weeks.    Medication Adjustments/Labs and Tests Ordered: Current medicines are reviewed at length with the patient today.  Concerns regarding medicines are outlined above.   Disposition:  Follow-up with [name] or APP 2 to 3 weeks after echo  Signed, Levell July, NP 04/12/2020 3:05 PM    [facility] at [place], [place], [place] [address] Phone: [phone]; Fax: [phone]

## 2020-04-11 NOTE — Progress Notes (Signed)
Established Patient Office Visit  Subjective:  Patient ID: Robin [name], female    DOB: 06-22-1940  Age: 80 y.o. MRN: [id number]  CC:  Chief Complaint  Patient presents with  . Hospitalization Follow-up    Transition of care discharged 3-18 from [facility] due to blood clot     HPI Robin [name]  is a 80 year old female with PMH of HTN, CAD s/p stent placement, carotid artery occlusion s/p carotid endarterectomy, hypothyroidism, generalized OA and GERD who presents for follow up after being discharged from hospital.  She was admitted on 03/16 with left UE swelling, which was found to be due to acute DVT. She was started on IV heparin, later switched to Eliquis. Her UE swelling is still present. She also had a breast mass biopsy, which was consistent with adenocarcinoma. She was discharged on 03/18. She is going to get PET scan done and have a visit with Dr. Delton Coombes. She denies any dyspnea, chest pain or palpitations.  Past Medical History:  Diagnosis Date  . Breast cancer ([place])    Tamoxifen started 2007  . CAD (coronary artery disease)    DES second diagonal 2/12  . Cardiomyopathy    Probable Takotsubo, LVEF 30-35% 2/12  . Carotid artery disease ([place])   . Essential hypertension   . Hepatic steatosis   . Hyperlipidemia   . Hypothyroidism   . NSTEMI (non-ST elevated myocardial infarction) ([place])    2/12  . Pancreas divisum   . Stroke ([place])    (2000) residual left-sided weakness  . Type 2 diabetes mellitus ([place])     Past Surgical History:  Procedure Laterality Date  . ABDOMINAL HYSTERECTOMY    . CAROTID ENDARTERECTOMY     Left  . CHOLECYSTECTOMY    . JOINT REPLACEMENT     [facility]  . MASTECTOMY     Bilateral    Family History  Problem Relation Age of Onset  . Heart attack Sister   . Heart disease Sister   . Heart attack Brother   . Heart disease Brother     Social History   Socioeconomic History  . Marital  status: Widowed    Spouse name: Not on file  . Number of children: Not on file  . Years of education: Not on file  . Highest education level: Not on file  Occupational History  . Not on file  Tobacco Use  . Smoking status: Never Smoker  . Smokeless tobacco: Never Used  Vaping Use  . Vaping Use: Never used  Substance and Sexual Activity  . Alcohol use: No    Alcohol/week: 0.0 standard drinks  . Drug use: No  . Sexual activity: Not on file  Other Topics Concern  . Not on file  Social History Narrative  . Not on file   Social Determinants of Health   Financial Resource Strain: Not on file  Food Insecurity: Not on file  Transportation Needs: Not on file  Physical Activity: Not on file  Stress: Not on file  Social Connections: Not on file  Intimate Partner Violence: Not on file    Outpatient Medications Prior to Visit  Medication Sig Dispense Refill  . acetaminophen (TYLENOL) 325 MG tablet Take 2 tablets (650 mg total) by mouth every 6 (six) hours as needed for mild pain, fever or headache (or Fever >/= 101). 12 tablet 0  . amLODipine (NORVASC) 10 MG tablet Take 1 tablet (10 mg total) by mouth daily.  90 tablet 3  . [START ON 05/06/2020] apixaban (ELIQUIS) 5 MG TABS tablet Take 1 tablet (5 mg total) by mouth 2 (two) times daily. Start after you complete the initial starter pack of Eliquis 60 tablet 5  . Apixaban Starter Pack, 10mg  and 5mg , (ELIQUIS DVT/PE STARTER PACK) Take as directed on package: start with two-5mg  tablets twice daily for 7 days. On day 8, switch to one-5mg  tablet twice daily. 1 each 0  . aspirin 81 MG tablet Take 1 tablet (81 mg total) by mouth daily with breakfast. 30 tablet 3  . carvedilol (COREG) 12.5 MG tablet Take 1 tablet (12.5 mg total) by mouth 2 (two) times daily with a meal. 90 tablet 3  . Cholecalciferol (VITAMIN D3) 125 MCG (5000 UT) CAPS TAKE 1 CAPSULE BY MOUTH ONCE DAILY (Patient taking differently: Take 1 capsule by mouth daily.) 90 capsule 1  .  gabapentin (NEURONTIN) 100 MG capsule Take 1 capsule (100 mg total) by mouth 3 (three) times daily. 90 capsule 2  . hydrALAZINE (APRESOLINE) 25 MG tablet Take 1 tablet (25 mg total) by mouth in the morning and at bedtime. 180 tablet 3  . isosorbide mononitrate (IMDUR) 30 MG 24 hr tablet Take 1 tablet (30 mg total) by mouth daily. 90 tablet 2  . levothyroxine (SYNTHROID) 137 MCG tablet Take 1 tablet (137 mcg total) by mouth daily before breakfast. 90 tablet 2  . losartan-hydrochlorothiazide (HYZAAR) 50-12.5 MG tablet Take 1 tablet by mouth daily. 90 tablet 3  . nitroGLYCERIN (NITROSTAT) 0.4 MG SL tablet Place 1 tablet (0.4 mg total) under the tongue every 5 (five) minutes x 3 doses as needed. 25 tablet 3  . Omega-3 Fatty Acids 300 MG CAPS Take 1 capsule (300 mg total) by mouth 2 (two) times daily.    . pantoprazole (PROTONIX) 40 MG tablet Take 1 tablet (40 mg total) by mouth daily. 30 tablet 3  . potassium chloride (KLOR-CON) 10 MEQ tablet Take 1 tablet (10 mEq total) by mouth daily. Take While taking Lasix/furosemide 30 tablet 2  . rosuvastatin (CRESTOR) 40 MG tablet Take 1 tablet (40 mg total) by mouth daily. 90 tablet 2  . senna-docusate (SENOKOT-S) 8.6-50 MG tablet Take 2 tablets by mouth at bedtime. 60 tablet 1   No facility-administered medications prior to visit.    Allergies  Allergen Reactions  . Codeine Nausea And Vomiting  . Tramadol     ROS Review of Systems  Constitutional: Negative for chills and fever.  HENT: Negative for congestion, sinus pressure, sinus pain and sore throat.   Eyes: Negative for pain and discharge.  Respiratory: Negative for cough and shortness of breath.   Cardiovascular: Positive for leg swelling. Negative for chest pain and palpitations.  Gastrointestinal: Negative for abdominal pain, constipation, diarrhea, nausea and vomiting.  Endocrine: Negative for polydipsia and polyuria.  Genitourinary: Negative for dysuria and hematuria.  Musculoskeletal:  Negative for neck pain and neck stiffness.  Skin: Negative for rash.  Neurological: Negative for dizziness and weakness.  Psychiatric/Behavioral: Negative for agitation and behavioral problems.      Objective:    Physical Exam Vitals reviewed.  Constitutional:      General: She is not in acute distress.    Appearance: She is not diaphoretic.  HENT:     Head: Normocephalic and atraumatic.     Nose: Nose normal.     Mouth/Throat:     Mouth: Mucous membranes are moist.  Eyes:     General: No scleral icterus.  Extraocular Movements: Extraocular movements intact.     Pupils: Pupils are equal, round, and reactive to light.  Cardiovascular:     Rate and Rhythm: Normal rate and regular rhythm.     Pulses: Normal pulses.     Heart sounds: Normal heart sounds. No murmur heard.   Pulmonary:     Breath sounds: Normal breath sounds. No wheezing or rales.  Musculoskeletal:     Cervical back: Neck supple. No tenderness.     Right lower leg: Edema present.     Left lower leg: Edema present.     Comments: Left UE swelling  Skin:    General: Skin is warm.     Findings: No rash.  Neurological:     General: No focal deficit present.     Mental Status: She is alert and oriented to person, place, and time.  Psychiatric:        Mood and Affect: Mood normal.        Behavior: Behavior normal.     BP (!) 173/86 (BP Location: Right Arm, Patient Position: Sitting)   Pulse 64   Resp 18   Ht 5\' 6"  (1.676 m)   SpO2 98%   BMI 32.93 kg/m  Wt Readings from Last 3 Encounters:  04/04/20 204 lb (92.5 kg)  04/04/20 220 lb (99.8 kg)  03/29/20 220 lb 6.4 oz (100 kg)     Health Maintenance Due  Topic Date Due  . COVID-19 Vaccine (3 - Moderna risk 4-dose series) 08/05/2019    There are no preventive care reminders to display for this patient.  Lab Results  Component Value Date   TSH 10.600 (H) 03/29/2020   Lab Results  Component Value Date   WBC 8.1 04/06/2020   HGB 9.8 (L)  04/06/2020   HCT 31.1 (L) 04/06/2020   MCV 83.4 04/06/2020   PLT 166 04/06/2020   Lab Results  Component Value Date   NA 139 04/05/2020   K 3.2 (L) 04/05/2020   CO2 23 04/05/2020   GLUCOSE 108 (H) 04/05/2020   BUN 21 04/05/2020   CREATININE 1.11 (H) 04/05/2020   BILITOT 0.9 04/04/2020   ALKPHOS 100 04/04/2020   AST 29 04/04/2020   ALT 14 04/04/2020   PROT 7.5 04/04/2020   ALBUMIN 3.9 04/04/2020   CALCIUM 9.9 04/05/2020   ANIONGAP 13 04/05/2020   Lab Results  Component Value Date   CHOL 145 03/01/2020   Lab Results  Component Value Date   HDL 46 (L) 03/01/2020   Lab Results  Component Value Date   LDLCALC 80 03/01/2020   Lab Results  Component Value Date   TRIG 109 03/01/2020   Lab Results  Component Value Date   CHOLHDL 3.2 03/01/2020   Lab Results  Component Value Date   HGBA1C 5.9 (H) 03/01/2020      Assessment & Plan:   Problem List Items Addressed This Visit      Cardiovascular and Mediastinum   Essential hypertension, benign BP Readings from Last 1 Encounters:  04/11/20 (!) 173/86   Uncontrolled Patient has brought home BP records, which are around 120s-130s/70s-80s Counseled for compliance with the medications Advised DASH diet and moderate exercise/walking, at least 150 mins/week Continue to follow up with Cardiology    Acute DVT (deep venous thrombosis) ([place]) - Primary On Eliquis currently Follow up with Heme/Onc.   Relevant Orders   CBC   Basic Metabolic Panel (BMET)    Other Visit Diagnoses    Hospital discharge follow-up  Hospital chart reviewed, including imaging and blood tests IV heparin for acute DVT, changed to Eliquis upon discharge Check CBC and BMP today    Relevant Orders   CBC   Basic Metabolic Panel (BMET)      No orders of the defined types were placed in this encounter.   Follow-up: Return in about 3 months (around 07/12/2020) for HTN.    [name], MD

## 2020-04-11 NOTE — Patient Instructions (Signed)
Please continue taking medications as prescribed.  Please follow up with Dr Delton Coombes as scheduled.  Please make sure to take Eliquis regularly.  If your BP remains more than 160/90 on 2 consecutive days, please contact us.

## 2020-04-12 ENCOUNTER — Ambulatory Visit (INDEPENDENT_AMBULATORY_CARE_PROVIDER_SITE_OTHER): Payer: Medicare PPO | Admitting: Family Medicine

## 2020-04-12 ENCOUNTER — Encounter: Payer: Self-pay | Admitting: Family Medicine

## 2020-04-12 ENCOUNTER — Other Ambulatory Visit (HOSPITAL_COMMUNITY): Payer: Self-pay

## 2020-04-12 VITALS — BP 148/70 | HR 64 | Ht 66.0 in | Wt 224.6 lb

## 2020-04-12 DIAGNOSIS — I779 Disorder of arteries and arterioles, unspecified: Secondary | ICD-10-CM | POA: Diagnosis not present

## 2020-04-12 DIAGNOSIS — I82622 Acute embolism and thrombosis of deep veins of left upper extremity: Secondary | ICD-10-CM

## 2020-04-12 DIAGNOSIS — Z170 Estrogen receptor positive status [ER+]: Secondary | ICD-10-CM

## 2020-04-12 DIAGNOSIS — I2510 Atherosclerotic heart disease of native coronary artery without angina pectoris: Secondary | ICD-10-CM

## 2020-04-12 DIAGNOSIS — I10 Essential (primary) hypertension: Secondary | ICD-10-CM

## 2020-04-12 DIAGNOSIS — I429 Cardiomyopathy, unspecified: Secondary | ICD-10-CM

## 2020-04-12 DIAGNOSIS — R0602 Shortness of breath: Secondary | ICD-10-CM

## 2020-04-12 DIAGNOSIS — C50912 Malignant neoplasm of unspecified site of left female breast: Secondary | ICD-10-CM | POA: Diagnosis not present

## 2020-04-12 DIAGNOSIS — Z8489 Family history of other specified conditions: Secondary | ICD-10-CM

## 2020-04-12 LAB — BASIC METABOLIC PANEL
BUN/Creatinine Ratio: 30 — ABNORMAL HIGH (ref 12–28)
BUN: 49 mg/dL — ABNORMAL HIGH (ref 8–27)
CO2: 21 mmol/L (ref 20–29)
Calcium: 10.4 mg/dL — ABNORMAL HIGH (ref 8.7–10.3)
Chloride: 100 mmol/L (ref 96–106)
Creatinine, Ser: 1.65 mg/dL — ABNORMAL HIGH (ref 0.57–1.00)
Glucose: 116 mg/dL — ABNORMAL HIGH (ref 65–99)
Potassium: 4 mmol/L (ref 3.5–5.2)
Sodium: 141 mmol/L (ref 134–144)
eGFR: 31 mL/min/{1.73_m2} — ABNORMAL LOW (ref >59)

## 2020-04-12 LAB — CBC
Hematocrit: 31.2 % — ABNORMAL LOW (ref 34.0–46.6)
Hemoglobin: 10.3 g/dL — ABNORMAL LOW (ref 11.1–15.9)
MCH: 25.7 pg — ABNORMAL LOW (ref 26.6–33.0)
MCHC: 33 g/dL (ref 31.5–35.7)
MCV: 78 fL — ABNORMAL LOW (ref 79–97)
Platelets: 192 10*3/uL (ref 150–450)
RBC: 4.01 x10E6/uL (ref 3.77–5.28)
RDW: 15.1 % (ref 11.7–15.4)
WBC: 8.3 10*3/uL (ref 3.4–10.8)

## 2020-04-12 LAB — SURGICAL PATHOLOGY

## 2020-04-12 MED ORDER — HYDROCODONE-ACETAMINOPHEN 5-325 MG PO TABS: 1.0000 | ORAL_TABLET | Freq: Two times a day (BID) | ORAL | 0 refills | Status: DC | PRN

## 2020-04-12 MED ORDER — FUROSEMIDE 20 MG PO TABS: 20.0000 mg | ORAL_TABLET | Freq: Every day | ORAL | 0 refills | Status: DC

## 2020-04-12 NOTE — Patient Instructions (Addendum)
Medication Instructions:   Lasix 20mg  daily x 7 days only.   Continue all other medications.    Labwork: BMET, Mg - orders given today.   Testing/Procedures:  Your physician has requested that you have an echocardiogram. Echocardiography is a painless test that uses sound waves to create images of your heart. It provides your doctor with information about the size and shape of your heart and how well your heart's chambers and valves are working. This procedure takes approximately one hour. There are no restrictions for this procedure.  Office will contact with results via phone or letter.    Follow-Up: 2-3 weeks  Any Other Special Instructions Will Be Listed Below (If Applicable). Call office in 1 week with update on weight and blood pressure.    If you need a refill on your cardiac medications before your next appointment, please call your pharmacy.

## 2020-04-20 ENCOUNTER — Telehealth: Payer: Self-pay | Admitting: Family Medicine

## 2020-04-20 MED ORDER — FUROSEMIDE 20 MG PO TABS: 20.0000 mg | ORAL_TABLET | Freq: Every day | ORAL | 1 refills | Status: DC

## 2020-04-20 NOTE — Telephone Encounter (Signed)
Please send her in a refill of her Lasix 20 mg daily.  Thank you

## 2020-04-20 NOTE — Telephone Encounter (Signed)
Patient notified and verbalized understanding.  Per pt. - 90 day supply sent to [facility] - [place], [place].

## 2020-04-20 NOTE — Telephone Encounter (Signed)
NEW MESSAGE    GIVING AN UPDATE ON PATIENT WEIGHT FROM THE LAST TIME SHE WAS IN THE OFFICE. The lasik she was put on is working to control the swelling , if you want her to continue taking it she will need a new rx.   TODAY 218.6 138/60 she has completed her 7 days of lasiks   [facility] [place]  30 day supply

## 2020-04-24 ENCOUNTER — Encounter (HOSPITAL_COMMUNITY): Payer: Medicare PPO

## 2020-04-26 ENCOUNTER — Ambulatory Visit (HOSPITAL_COMMUNITY): Payer: Medicare PPO | Admitting: Hematology

## 2020-04-26 ENCOUNTER — Telehealth (INDEPENDENT_AMBULATORY_CARE_PROVIDER_SITE_OTHER): Payer: Medicare PPO | Admitting: Internal Medicine

## 2020-04-26 ENCOUNTER — Encounter: Payer: Self-pay | Admitting: Internal Medicine

## 2020-04-26 ENCOUNTER — Other Ambulatory Visit: Payer: Self-pay

## 2020-04-26 VITALS — BP 124/45 | HR 64 | Resp 24

## 2020-04-26 DIAGNOSIS — K529 Noninfective gastroenteritis and colitis, unspecified: Secondary | ICD-10-CM | POA: Diagnosis not present

## 2020-04-26 NOTE — Progress Notes (Signed)
Virtual Visit via Telephone Note   This visit type was conducted due to national recommendations for restrictions regarding the COVID-19 Pandemic (e.g. social distancing) in an effort to limit this patient's exposure and mitigate transmission in our community.  Due to her co-morbid illnesses, this patient is at least at moderate risk for complications without adequate follow up.  This format is felt to be most appropriate for this patient at this time.  The patient did not have access to video technology/had technical difficulties with video requiring transitioning to audio format only (telephone).  All issues noted in this document were discussed and addressed.  No physical exam could be performed with this format.  Please refer to the patient's chart for her  consent to telehealth for [facility]   Evaluation Performed:  Follow-up visit  Date:  04/26/2020   ID:  Robin [name], DOB May 28, 1940, MRN [id number]  Patient Location: Home Provider Location: Office/Clinic  Location of Patient: Home Location of Provider: Telehealth Consent was obtain for visit to be over via telehealth. I verified that I am speaking with the correct person using two identifiers.  PCP:  [name], MD   Chief Complaint:  Loose BM  History of Present Illness:    Robin [name] is a 80 y.o. female who has a televisit for c/o loose BM yesterday. She states that it has improved today. She has been feeling tired today and she has been having knee pain. She denies any melena or hematochezia. Denies any watery diarrhea, nausea or vomiting.  The patient does not have symptoms concerning for COVID-19 infection (fever, chills, cough, or new shortness of breath).   Past Medical, Surgical, Social History, Allergies, and Medications have been Reviewed.  Past Medical History:  Diagnosis Date  . Breast cancer ([place])    Tamoxifen started 2007  . CAD (coronary artery disease)    DES second diagonal 2/12  .  Cardiomyopathy    Probable Takotsubo, LVEF 30-35% 2/12  . Carotid artery disease ([place])   . Essential hypertension   . Hepatic steatosis   . Hyperlipidemia   . Hypothyroidism   . NSTEMI (non-ST elevated myocardial infarction) ([place])    2/12  . Pancreas divisum   . Stroke ([place])    (2000) residual left-sided weakness  . Type 2 diabetes mellitus ([place])    Past Surgical History:  Procedure Laterality Date  . ABDOMINAL HYSTERECTOMY    . CAROTID ENDARTERECTOMY     Left  . CHOLECYSTECTOMY    . JOINT REPLACEMENT     [facility]  . MASTECTOMY     Bilateral     Current Meds  Medication Sig  . acetaminophen (TYLENOL) 325 MG tablet Take 2 tablets (650 mg total) by mouth every 6 (six) hours as needed for mild pain, fever or headache (or Fever >/= 101).  Marland Kitchen amLODipine (NORVASC) 10 MG tablet Take 1 tablet (10 mg total) by mouth daily.  Derrill Memo ON 05/06/2020] apixaban (ELIQUIS) 5 MG TABS tablet Take 1 tablet (5 mg total) by mouth 2 (two) times daily. Start after you complete the initial starter pack of Eliquis  . Apixaban Starter Pack, 10mg  and 5mg , (ELIQUIS DVT/PE STARTER PACK) Take as directed on package: start with two-5mg  tablets twice daily for 7 days. On day 8, switch to one-5mg  tablet twice daily.  Marland Kitchen aspirin 81 MG tablet Take 1 tablet (81 mg total) by mouth daily with breakfast.  . carvedilol (  COREG) 12.5 MG tablet Take 1 tablet (12.5 mg total) by mouth 2 (two) times daily with a meal.  . Cholecalciferol (VITAMIN D3) 125 MCG (5000 UT) CAPS TAKE 1 CAPSULE BY MOUTH ONCE DAILY (Patient taking differently: Take 1 capsule by mouth daily.)  . furosemide (LASIX) 20 MG tablet Take 1 tablet (20 mg total) by mouth daily.  Marland Kitchen gabapentin (NEURONTIN) 100 MG capsule Take 1 capsule (100 mg total) by mouth 3 (three) times daily.  . hydrALAZINE (APRESOLINE) 25 MG tablet Take 1 tablet (25 mg total) by mouth in the morning and at bedtime.  Marland Kitchen HYDROcodone-acetaminophen  (NORCO/VICODIN) 5-325 MG tablet Take 1 tablet by mouth every 12 (twelve) hours as needed for moderate pain.  . isosorbide mononitrate (IMDUR) 30 MG 24 hr tablet Take 1 tablet (30 mg total) by mouth daily.  Marland Kitchen levothyroxine (SYNTHROID) 137 MCG tablet Take 1 tablet (137 mcg total) by mouth daily before breakfast.  . losartan-hydrochlorothiazide (HYZAAR) 50-12.5 MG tablet Take 1 tablet by mouth daily.  . nitroGLYCERIN (NITROSTAT) 0.4 MG SL tablet Place 1 tablet (0.4 mg total) under the tongue every 5 (five) minutes x 3 doses as needed.  . Omega-3 Fatty Acids 300 MG CAPS Take 1 capsule (300 mg total) by mouth 2 (two) times daily.  . pantoprazole (PROTONIX) 40 MG tablet Take 1 tablet (40 mg total) by mouth daily.  . potassium chloride (KLOR-CON) 10 MEQ tablet Take 1 tablet (10 mEq total) by mouth daily. Take While taking Lasix/furosemide  . rosuvastatin (CRESTOR) 40 MG tablet Take 1 tablet (40 mg total) by mouth daily.  Marland Kitchen senna-docusate (SENOKOT-S) 8.6-50 MG tablet Take 2 tablets by mouth at bedtime.     Allergies:   Codeine and Tramadol   ROS:   Please see the history of present illness.     All other systems reviewed and are negative.   Labs/Other Tests and Data Reviewed:    Recent Labs: 03/29/2020: TSH 10.600 04/04/2020: ALT 14; B Natriuretic Peptide 92.0 04/11/2020: BUN 49; Creatinine, Ser 1.65; Hemoglobin 10.3; Platelets 192; Potassium 4.0; Sodium 141   Recent Lipid Panel Lab Results  Component Value Date/Time   CHOL 145 03/01/2020 10:08 AM   TRIG 109 03/01/2020 10:08 AM   HDL 46 (L) 03/01/2020 10:08 AM   CHOLHDL 3.2 03/01/2020 10:08 AM   LDLCALC 80 03/01/2020 10:08 AM    Wt Readings from Last 3 Encounters:  04/12/20 224 lb 9.6 oz (101.9 kg)  04/04/20 204 lb (92.5 kg)  04/04/20 220 lb (99.8 kg)      ASSESSMENT & PLAN:    Gastroenteritis Likely viral Usually self-resolving Advised for proper hydration Advised to contact if persistent or new symptoms  Time:   Today, I  have spent 7 minutes reviewing the chart, including problem list, medications, and with the patient with telehealth technology discussing the above problems.   Medication Adjustments/Labs and Tests Ordered: Current medicines are reviewed at length with the patient today.  Concerns regarding medicines are outlined above.   Tests Ordered: No orders of the defined types were placed in this encounter.   Medication Changes: No orders of the defined types were placed in this encounter.    Note: This dictation was prepared with Dragon dictation along with smaller phrase technology. Similar sounding words can be transcribed inadequately or may not be corrected upon review. Any transcriptional errors that result from this process are unintentional.      Disposition:  Follow up  Signed, [name], MD  04/26/2020 2:48  PM     [facility]

## 2020-04-26 NOTE — Patient Instructions (Signed)

## 2020-05-01 ENCOUNTER — Inpatient Hospital Stay (HOSPITAL_COMMUNITY)
Admission: EM | Admit: 2020-05-01 | Discharge: 2020-05-05 | DRG: 378 | Disposition: A | Discharge status: Home / Self Care | Payer: Medicare PPO | Attending: Family Medicine | Admitting: Family Medicine

## 2020-05-01 ENCOUNTER — Emergency Department (HOSPITAL_COMMUNITY): Payer: Medicare PPO

## 2020-05-01 ENCOUNTER — Ambulatory Visit (HOSPITAL_COMMUNITY): Admission: RE | Admit: 2020-05-01 | Payer: Medicare PPO | Source: Ambulatory Visit

## 2020-05-01 ENCOUNTER — Other Ambulatory Visit: Payer: Self-pay

## 2020-05-01 ENCOUNTER — Encounter (HOSPITAL_COMMUNITY): Payer: Self-pay | Admitting: *Deleted

## 2020-05-01 DIAGNOSIS — K922 Gastrointestinal hemorrhage, unspecified: Secondary | ICD-10-CM

## 2020-05-01 DIAGNOSIS — I10 Essential (primary) hypertension: Secondary | ICD-10-CM | POA: Diagnosis present

## 2020-05-01 DIAGNOSIS — D62 Acute posthemorrhagic anemia: Secondary | ICD-10-CM | POA: Diagnosis present

## 2020-05-01 DIAGNOSIS — D649 Anemia, unspecified: Secondary | ICD-10-CM | POA: Diagnosis not present

## 2020-05-01 DIAGNOSIS — Z79899 Other long term (current) drug therapy: Secondary | ICD-10-CM

## 2020-05-01 DIAGNOSIS — Z7982 Long term (current) use of aspirin: Secondary | ICD-10-CM

## 2020-05-01 DIAGNOSIS — Z170 Estrogen receptor positive status [ER+]: Secondary | ICD-10-CM

## 2020-05-01 DIAGNOSIS — I2510 Atherosclerotic heart disease of native coronary artery without angina pectoris: Secondary | ICD-10-CM | POA: Diagnosis present

## 2020-05-01 DIAGNOSIS — K921 Melena: Secondary | ICD-10-CM | POA: Diagnosis not present

## 2020-05-01 DIAGNOSIS — K5730 Diverticulosis of large intestine without perforation or abscess without bleeding: Secondary | ICD-10-CM | POA: Diagnosis present

## 2020-05-01 DIAGNOSIS — R0789 Other chest pain: Secondary | ICD-10-CM

## 2020-05-01 DIAGNOSIS — Z7901 Long term (current) use of anticoagulants: Secondary | ICD-10-CM

## 2020-05-01 DIAGNOSIS — Z9013 Acquired absence of bilateral breasts and nipples: Secondary | ICD-10-CM

## 2020-05-01 DIAGNOSIS — Z20822 Contact with and (suspected) exposure to covid-19: Secondary | ICD-10-CM | POA: Diagnosis present

## 2020-05-01 DIAGNOSIS — E1122 Type 2 diabetes mellitus with diabetic chronic kidney disease: Secondary | ICD-10-CM | POA: Diagnosis present

## 2020-05-01 DIAGNOSIS — K635 Polyp of colon: Secondary | ICD-10-CM | POA: Diagnosis present

## 2020-05-01 DIAGNOSIS — Z96651 Presence of right artificial knee joint: Secondary | ICD-10-CM | POA: Diagnosis present

## 2020-05-01 DIAGNOSIS — I252 Old myocardial infarction: Secondary | ICD-10-CM

## 2020-05-01 DIAGNOSIS — R195 Other fecal abnormalities: Secondary | ICD-10-CM | POA: Diagnosis not present

## 2020-05-01 DIAGNOSIS — R079 Chest pain, unspecified: Secondary | ICD-10-CM | POA: Diagnosis not present

## 2020-05-01 DIAGNOSIS — Z885 Allergy status to narcotic agent status: Secondary | ICD-10-CM

## 2020-05-01 DIAGNOSIS — I130 Hypertensive heart and chronic kidney disease with heart failure and stage 1 through stage 4 chronic kidney disease, or unspecified chronic kidney disease: Secondary | ICD-10-CM | POA: Diagnosis present

## 2020-05-01 DIAGNOSIS — N1831 Chronic kidney disease, stage 3a: Secondary | ICD-10-CM | POA: Diagnosis present

## 2020-05-01 DIAGNOSIS — Z79890 Hormone replacement therapy (postmenopausal): Secondary | ICD-10-CM

## 2020-05-01 DIAGNOSIS — K640 First degree hemorrhoids: Secondary | ICD-10-CM | POA: Diagnosis present

## 2020-05-01 DIAGNOSIS — I429 Cardiomyopathy, unspecified: Secondary | ICD-10-CM | POA: Diagnosis present

## 2020-05-01 DIAGNOSIS — I5032 Chronic diastolic (congestive) heart failure: Secondary | ICD-10-CM | POA: Diagnosis present

## 2020-05-01 DIAGNOSIS — I69354 Hemiplegia and hemiparesis following cerebral infarction affecting left non-dominant side: Secondary | ICD-10-CM

## 2020-05-01 DIAGNOSIS — E785 Hyperlipidemia, unspecified: Secondary | ICD-10-CM | POA: Diagnosis present

## 2020-05-01 DIAGNOSIS — E039 Hypothyroidism, unspecified: Secondary | ICD-10-CM | POA: Diagnosis present

## 2020-05-01 DIAGNOSIS — I82409 Acute embolism and thrombosis of unspecified deep veins of unspecified lower extremity: Secondary | ICD-10-CM | POA: Diagnosis present

## 2020-05-01 DIAGNOSIS — E876 Hypokalemia: Secondary | ICD-10-CM | POA: Diagnosis not present

## 2020-05-01 DIAGNOSIS — Z808 Family history of malignant neoplasm of other organs or systems: Secondary | ICD-10-CM

## 2020-05-01 DIAGNOSIS — K449 Diaphragmatic hernia without obstruction or gangrene: Secondary | ICD-10-CM | POA: Diagnosis present

## 2020-05-01 DIAGNOSIS — C50912 Malignant neoplasm of unspecified site of left female breast: Secondary | ICD-10-CM | POA: Diagnosis present

## 2020-05-01 DIAGNOSIS — Z8249 Family history of ischemic heart disease and other diseases of the circulatory system: Secondary | ICD-10-CM

## 2020-05-01 DIAGNOSIS — Z86718 Personal history of other venous thrombosis and embolism: Secondary | ICD-10-CM

## 2020-05-01 DIAGNOSIS — C7951 Secondary malignant neoplasm of bone: Secondary | ICD-10-CM | POA: Diagnosis present

## 2020-05-01 LAB — URINALYSIS, ROUTINE W REFLEX MICROSCOPIC
Bacteria, UA: NONE SEEN
Bilirubin Urine: NEGATIVE
Glucose, UA: NEGATIVE mg/dL
Ketones, ur: NEGATIVE mg/dL
Leukocytes,Ua: NEGATIVE
Nitrite: NEGATIVE
Protein, ur: NEGATIVE mg/dL
Specific Gravity, Urine: 1.009 (ref 1.005–1.030)
pH: 8 (ref 5.0–8.0)

## 2020-05-01 LAB — COMPREHENSIVE METABOLIC PANEL
ALT: 14 U/L (ref 0–44)
AST: 26 U/L (ref 15–41)
Albumin: 3.9 g/dL (ref 3.5–5.0)
Alkaline Phosphatase: 128 U/L — ABNORMAL HIGH (ref 38–126)
Anion gap: 13 (ref 5–15)
BUN: 42 mg/dL — ABNORMAL HIGH (ref 8–23)
CO2: 24 mmol/L (ref 22–32)
Calcium: 9.9 mg/dL (ref 8.9–10.3)
Chloride: 102 mmol/L (ref 98–111)
Creatinine, Ser: 1.76 mg/dL — ABNORMAL HIGH (ref 0.44–1.00)
GFR, Estimated: 29 mL/min — ABNORMAL LOW (ref >60)
Glucose, Bld: 119 mg/dL — ABNORMAL HIGH (ref 70–99)
Potassium: 3.4 mmol/L — ABNORMAL LOW (ref 3.5–5.1)
Sodium: 139 mmol/L (ref 135–145)
Total Bilirubin: 0.6 mg/dL (ref 0.3–1.2)
Total Protein: 7.6 g/dL (ref 6.5–8.1)

## 2020-05-01 LAB — CBC WITH DIFFERENTIAL/PLATELET
Abs Immature Granulocytes: 0.1 10*3/uL — ABNORMAL HIGH (ref 0.00–0.07)
Basophils Absolute: 0 10*3/uL (ref 0.0–0.1)
Basophils Relative: 0 %
Eosinophils Absolute: 0.2 10*3/uL (ref 0.0–0.5)
Eosinophils Relative: 3 %
HCT: 27.7 % — ABNORMAL LOW (ref 36.0–46.0)
Hemoglobin: 8.8 g/dL — ABNORMAL LOW (ref 12.0–15.0)
Immature Granulocytes: 1 %
Lymphocytes Relative: 14 %
Lymphs Abs: 1.2 10*3/uL (ref 0.7–4.0)
MCH: 26.7 pg (ref 26.0–34.0)
MCHC: 31.8 g/dL (ref 30.0–36.0)
MCV: 83.9 fL (ref 80.0–100.0)
Monocytes Absolute: 0.7 10*3/uL (ref 0.1–1.0)
Monocytes Relative: 9 %
Neutro Abs: 6.1 10*3/uL (ref 1.7–7.7)
Neutrophils Relative %: 73 %
Platelets: 205 10*3/uL (ref 150–400)
RBC: 3.3 MIL/uL — ABNORMAL LOW (ref 3.87–5.11)
RDW: 16.7 % — ABNORMAL HIGH (ref 11.5–15.5)
WBC: 8.4 10*3/uL (ref 4.0–10.5)
nRBC: 0 % (ref 0.0–0.2)

## 2020-05-01 LAB — IRON AND TIBC
Iron: 84 ug/dL (ref 28–170)
Saturation Ratios: 16 % (ref 10.4–31.8)
TIBC: 512 ug/dL — ABNORMAL HIGH (ref 250–450)
UIBC: 428 ug/dL

## 2020-05-01 LAB — FERRITIN: Ferritin: 34 ng/mL (ref 11–307)

## 2020-05-01 LAB — TROPONIN I (HIGH SENSITIVITY)
Troponin I (High Sensitivity): 10 ng/L (ref <18)
Troponin I (High Sensitivity): 10 ng/L (ref <18)

## 2020-05-01 LAB — GLUCOSE, CAPILLARY: Glucose-Capillary: 106 mg/dL — ABNORMAL HIGH (ref 70–99)

## 2020-05-01 LAB — POC OCCULT BLOOD, ED: Fecal Occult Bld: POSITIVE — AB

## 2020-05-01 MED ORDER — SODIUM CHLORIDE 0.9 % IV SOLN: 250.0000 mL | INTRAVENOUS | Status: DC | PRN

## 2020-05-01 MED ORDER — SODIUM CHLORIDE 0.9% FLUSH
3.0000 mL | Freq: Two times a day (BID) | INTRAVENOUS | Status: DC
  Administered 2020-05-01 – 2020-05-03 (×2): 3 mL via INTRAVENOUS

## 2020-05-01 MED ORDER — POLYETHYLENE GLYCOL 3350 17 G PO PACK: 17.0000 g | PACK | Freq: Every day | ORAL | Status: DC | PRN

## 2020-05-01 MED ORDER — ONDANSETRON HCL 4 MG/2ML IJ SOLN: 4.0000 mg | Freq: Four times a day (QID) | INTRAMUSCULAR | Status: DC | PRN

## 2020-05-01 MED ORDER — TRAZODONE HCL 50 MG PO TABS: 50.0000 mg | ORAL_TABLET | Freq: Every evening | ORAL | Status: DC | PRN

## 2020-05-01 MED ORDER — ALUM & MAG HYDROXIDE-SIMETH 200-200-20 MG/5ML PO SUSP
30.0000 mL | Freq: Once | ORAL | Status: AC
  Administered 2020-05-01: 30 mL via ORAL
  Filled 2020-05-01: qty 30

## 2020-05-01 MED ORDER — ACETAMINOPHEN 325 MG PO TABS
650.0000 mg | ORAL_TABLET | Freq: Four times a day (QID) | ORAL | Status: DC | PRN
  Administered 2020-05-01: 650 mg via ORAL
  Filled 2020-05-01: qty 2

## 2020-05-01 MED ORDER — ACETAMINOPHEN 650 MG RE SUPP
650.0000 mg | Freq: Four times a day (QID) | RECTAL | Status: DC | PRN
Start: 2020-05-01 — End: 2020-05-05

## 2020-05-01 MED ORDER — HYDRALAZINE HCL 25 MG PO TABS
25.0000 mg | ORAL_TABLET | Freq: Three times a day (TID) | ORAL | Status: DC
  Administered 2020-05-01 – 2020-05-04 (×8): 25 mg via ORAL
  Filled 2020-05-01 (×10): qty 1

## 2020-05-01 MED ORDER — SODIUM CHLORIDE 0.9% FLUSH: 3.0000 mL | INTRAVENOUS | Status: DC | PRN

## 2020-05-01 MED ORDER — OMEGA-3-ACID ETHYL ESTERS 1 G PO CAPS
1.0000 | ORAL_CAPSULE | Freq: Every day | ORAL | Status: DC
  Administered 2020-05-02 – 2020-05-05 (×4): 1 g via ORAL
  Filled 2020-05-01 (×4): qty 1

## 2020-05-01 MED ORDER — ONDANSETRON HCL 4 MG PO TABS: 4.0000 mg | ORAL_TABLET | Freq: Four times a day (QID) | ORAL | Status: DC | PRN

## 2020-05-01 MED ORDER — BISACODYL 10 MG RE SUPP: 10.0000 mg | Freq: Every day | RECTAL | Status: DC | PRN

## 2020-05-01 MED ORDER — ROSUVASTATIN CALCIUM 20 MG PO TABS
40.0000 mg | ORAL_TABLET | Freq: Every day | ORAL | Status: DC
  Administered 2020-05-01 – 2020-05-05 (×5): 40 mg via ORAL
  Filled 2020-05-01 (×5): qty 2

## 2020-05-01 MED ORDER — SENNOSIDES-DOCUSATE SODIUM 8.6-50 MG PO TABS
2.0000 | ORAL_TABLET | Freq: Every day | ORAL | Status: DC
  Administered 2020-05-01 – 2020-05-04 (×3): 2 via ORAL
  Filled 2020-05-01 (×4): qty 2

## 2020-05-01 MED ORDER — CARVEDILOL 12.5 MG PO TABS
12.5000 mg | ORAL_TABLET | Freq: Two times a day (BID) | ORAL | Status: DC
  Administered 2020-05-01 – 2020-05-04 (×5): 12.5 mg via ORAL
  Filled 2020-05-01 (×7): qty 1

## 2020-05-01 MED ORDER — HYDROCODONE-ACETAMINOPHEN 5-325 MG PO TABS: 1.0000 | ORAL_TABLET | Freq: Two times a day (BID) | ORAL | Status: DC | PRN

## 2020-05-01 MED ORDER — LEVOTHYROXINE SODIUM 137 MCG PO TABS
137.0000 ug | ORAL_TABLET | Freq: Every day | ORAL | Status: DC
  Administered 2020-05-02 – 2020-05-05 (×2): 137 ug via ORAL
  Filled 2020-05-01 (×3): qty 1

## 2020-05-01 MED ORDER — VITAMIN D3 25 MCG (1000 UNIT) PO TABS
5000.0000 [IU] | ORAL_TABLET | Freq: Every day | ORAL | Status: DC
  Administered 2020-05-02 – 2020-05-05 (×4): 5000 [IU] via ORAL
  Filled 2020-05-01 (×10): qty 5

## 2020-05-01 MED ORDER — ISOSORBIDE MONONITRATE ER 60 MG PO TB24
30.0000 mg | ORAL_TABLET | Freq: Every day | ORAL | Status: DC
  Administered 2020-05-01 – 2020-05-05 (×5): 30 mg via ORAL
  Filled 2020-05-01 (×5): qty 1

## 2020-05-01 MED ORDER — LIDOCAINE VISCOUS HCL 2 % MT SOLN
15.0000 mL | Freq: Once | OROMUCOSAL | Status: AC
  Administered 2020-05-01: 15 mL via ORAL
  Filled 2020-05-01: qty 15

## 2020-05-01 MED ORDER — LABETALOL HCL 5 MG/ML IV SOLN: 10.0000 mg | INTRAVENOUS | Status: DC | PRN

## 2020-05-01 MED ORDER — SODIUM CHLORIDE 0.9% FLUSH
3.0000 mL | Freq: Two times a day (BID) | INTRAVENOUS | Status: DC
  Administered 2020-05-01 – 2020-05-05 (×6): 3 mL via INTRAVENOUS

## 2020-05-01 MED ORDER — PANTOPRAZOLE SODIUM 40 MG IV SOLR
40.0000 mg | Freq: Two times a day (BID) | INTRAVENOUS | Status: DC
  Administered 2020-05-01 – 2020-05-04 (×6): 40 mg via INTRAVENOUS
  Filled 2020-05-01 (×7): qty 40

## 2020-05-01 MED ORDER — GABAPENTIN 100 MG PO CAPS
100.0000 mg | ORAL_CAPSULE | Freq: Three times a day (TID) | ORAL | Status: DC
  Administered 2020-05-01 – 2020-05-05 (×10): 100 mg via ORAL
  Filled 2020-05-01 (×10): qty 1

## 2020-05-01 NOTE — ED Triage Notes (Signed)
Pt c/o upper chest pain along that radiates into the back with nausea that started sometime during the night. Denies SOB, dizziness, lightheadedness, vomiting.

## 2020-05-01 NOTE — ED Notes (Signed)
pts family member at bedside. She states pt has a outpatient echo ordered for today @ 12:30 and wants to know if pt will be d/c home before that time. Explained to patient and family that the work up is not complete yet and that can not be determined at this time. Pt also states her chest pain is only exacerbated when she moves her right arm up and down. MD made aware.

## 2020-05-01 NOTE — ED Notes (Signed)
Purewick placed on pt. 

## 2020-05-01 NOTE — ED Notes (Signed)
Pt given two blankets.

## 2020-05-01 NOTE — ED Notes (Signed)
Lab at bedside for second troponin.

## 2020-05-01 NOTE — H&P (Signed)
Patient Demographics:    Robin [name], is a 80 y.o. female  MRN: [id number]   DOB - [birth year]  Admit Date - 05/01/2020  Outpatient Primary MD for the patient is Robin Spar, MD   Assessment & Plan:    Principal Problem:   Acute GI bleeding Active Problems:   Acute DVT (deep venous thrombosis) --LtUE---03/2020   Malignant neoplasm of left breast in female, estrogen receptor positive ([place])   Acute on chronic anemia Due to Acute Blood Loss/Gi Bleed   Secondary cardiomyopathy ([place])   Hypothyroidism   Essential hypertension, benign    1)Acute GI bleed--- leading to acute on chronic symptomatic anemia -Patient with heme positive stools, dyspnea on exertion dizziness-while on Eliquis -Hemoglobin is down to 8.8 from 10.3 couple weeks ago -Serum iron is 84, ferritin is 34, check B12 and folate -Hold Eliquis -GI consult for possible EGD -Serial H&H and transfuse as clinically indicated  2)Lt UE DVT- dxed 03/2020----with venous Dopplers at that time showing occlusive thrombus involving the left internal jugular vein, left subclavian vein, left axillary vein, left brachial vein and left basilic vein -Hold Eliquis due to #1 above -Anticipate lifelong anticoagulation given underlying malignancy  3)Recurrent stage IV left-sided breast cancer with bony metastasis -----pt sees oncologist Dr. Delton [name] S/p  biopsy on 04/05/2020, pathology is consistent with adenocarcinoma -Awaiting outpatient PET scan -history of Recurrent breast cancer --patient had right breast cancer in 1987 and left breast cancer in 2007 both treated with mastectomies without chemo or radiation--in remissionsince 2014, now with recurrence of breast cancer in 2022 with metastasis ---Patientused to follow with Dr. Carl [name] from [facility],patient  now sees Dr.Katragadda  3)HTN---Hold amlodipine and Losartan/ HCTZ, c/n  Coreg 0.5 mg twice daily, hydralazine 50 mg3 times daily and isosorbide  4)H/o CAD/PAD--status post prior carotid endarterectomy--okay to stop Plavix as patient will need to be on Eliquis for DVT -Continue aspirin and Crestor as well as Coreg  5)hypothyroidism--stable, continue levothyroxine 137 mcg daily  6)Social/ethics---plan of care discussed with patient and her daughter Robin [name] who is at bedside and patient's other daughter Robin [name] is on the phone -Patient is full code with full scope of treatment  7)HLD-- c/n Crestor  8)HFpEF--history of chronic diastolic dysfunction CHF EF 60 to 65% based on echo from October 2021 -Repeat echo pending  9)AKI----acute kidney injury on CKD 3A-    --Creatinine currently 1.76, patient was previously around 1.2 -BUN is up to 42,  -Hold losartan HCTZ, hold Lasix  , renally adjust medications, avoid nephrotoxic agents / dehydration  / hypotension  10)PAD-carotid artery stenosis status post endarterectomy/CAD--hold aspirin, continue Coreg, crestor and isosorbide  11)Social/Ethics--plan of care and advanced directives discussed with pt and daughter Robin [name] --- patient is a full code  [place] home   Disposition: The patient is from:Home Anticipated d/c is [id number]  Code Status:-Code Status: Full Code  Family Communication: (patient is alert, awake and coherent)  Discussed withdaughtersBarbara and [place]  Consults :Radiology/oncology/general surgery  Disposition/Need for in-Hospital Stay- patient unable to be discharged at this time due to symptomatic anemia due to acute GI bleeding the patient with recent DVT requiring for anticoagulation-- -- Dispo: The patient is from: Home              Anticipated d/c is to: Home              Anticipated d/c date is: 2 days              Patient currently is not  medically stable to d/c. Barriers: Not Clinically Stable-   With History of - Reviewed by me  Past Medical History:  Diagnosis Date  . Breast cancer ([place])    Tamoxifen started 2007  . CAD (coronary artery disease)    DES second diagonal 2/12  . Cardiomyopathy    Probable Takotsubo, LVEF 30-35% 2/12  . Carotid artery disease ([place])   . Essential hypertension   . Hepatic steatosis   . Hyperlipidemia   . Hypothyroidism   . NSTEMI (non-ST elevated myocardial infarction) ([place])    2/12  . Pancreas divisum   . Stroke ([place])    (2000) residual left-sided weakness  . Type 2 diabetes mellitus ([place])       Past Surgical History:  Procedure Laterality Date  . ABDOMINAL HYSTERECTOMY    . CAROTID ENDARTERECTOMY     Left  . CHOLECYSTECTOMY    . JOINT REPLACEMENT     Right knee replacement- [facility] Sebastian [name]  . MASTECTOMY     Bilateral    Chief Complaint  Patient presents with  . Chest Pain      HPI:    Robin [name]  is a 80 y.o. female with past medical history ofHTN, HLD, PAD-carotid artery stenosis status post endarterectomy, CAD,HFpEF/chronic grade 1 diastolic dysfunction,h/oCVA, h/o Lt UE DVT, hypothyroidism, history of Recurrent breast cancer --patient had right breast cancer in 1987 and left breast cancer in 2007 both treated with mastectomies without chemo or radiation--in remissionsince 2014, now with recurrence of breast cancer in 2022 with metastasis presents to the ED with dizziness , chest discomfort and dyspnea on exertion in the setting of loose stools, stools are heme positive in the ED -Patient is on Eliquis for left upper extremity DVT -Hemoglobin is down to 8.8 from 10.3 couple weeks ago  -EKG is nonacute, troponin negative Chest x-ray is nonacute -Creatinine is up to 1.75 -GI service evaluated patient and recommended admission to the hospital for possible endoluminal evaluation -Cardiology team discussed with EDP and requested repeat echo No fever   Or chills  No emesis, -Additional history obtained from patient's daughter Robin [name] at bedside   Review of systems:    In addition to the HPI above,   A full Review of  Systems was done, all other systems reviewed are negative except as noted above in HPI , .   Social History:  Reviewed by me    Social History   Tobacco Use  . Smoking status: Never Smoker  . Smokeless tobacco: Never Used  Substance Use Topics  . Alcohol use: No    Alcohol/week: 0.0 standard drinks     Family History :  Reviewed by me    Family History  Problem Relation Age of Onset  . Heart attack Sister   . Heart disease Sister   . Heart attack Brother   . Heart disease Brother   . Throat cancer Brother   . Colon cancer  Neg Hx      Home Medications:   Prior to Admission medications   Medication Sig Start Date End Date Taking? Authorizing Provider  acetaminophen (TYLENOL) 325 MG tablet Take 2 tablets (650 mg total) by mouth every 6 (six) hours as needed for mild pain, fever or headache (or Fever >/= 101). 04/06/20  Yes Brandalynn Ofallon, MD  amLODipine (NORVASC) 10 MG tablet Take 1 tablet (10 mg total) by mouth daily. 04/06/20  Yes Skye Rodarte, MD  apixaban (ELIQUIS) 5 MG TABS tablet Take 1 tablet (5 mg total) by mouth 2 (two) times daily. Start after you complete the initial starter pack of Eliquis 05/06/20  Yes Roxan Hockey, MD  aspirin 81 MG tablet Take 1 tablet (81 mg total) by mouth daily with breakfast. 04/06/20  Yes Americus Perkey, MD  carvedilol (COREG) 12.5 MG tablet Take 1 tablet (12.5 mg total) by mouth 2 (two) times daily with a meal. 04/06/20  Yes Shiro Ellerman, MD  Cholecalciferol (VITAMIN D3) 125 MCG (5000 UT) CAPS TAKE 1 CAPSULE BY MOUTH ONCE DAILY Patient taking differently: Take 1 capsule by mouth daily. 01/14/18  Yes Nida, Marella Chimes, MD  furosemide (LASIX) 20 MG tablet Take 1 tablet (20 mg total) by mouth daily. 04/20/20  Yes Verta Ellen., NP  gabapentin (NEURONTIN)  100 MG capsule Take 1 capsule (100 mg total) by mouth 3 (three) times daily. 03/01/20  Yes Carole Civil, MD  HYDROcodone-acetaminophen (NORCO/VICODIN) 5-325 MG tablet Take 1 tablet by mouth every 12 (twelve) hours as needed for moderate pain. 04/12/20  Yes Derek Jack, MD  isosorbide mononitrate (IMDUR) 30 MG 24 hr tablet Take 1 tablet (30 mg total) by mouth daily. 04/07/20  Yes Roxan Hockey, MD  levothyroxine (SYNTHROID) 137 MCG tablet Take 1 tablet (137 mcg total) by mouth daily before breakfast. 04/06/20  Yes Stran Raper, MD  losartan-hydrochlorothiazide (HYZAAR) 50-12.5 MG tablet Take 1 tablet by mouth daily. 04/06/20  Yes Jacquelina Hewins, MD  Omega-3 Fatty Acids 300 MG CAPS Take 1 capsule (300 mg total) by mouth 2 (two) times daily. 04/24/11  Yes Serpe, Burna Forts, PA-C  pantoprazole (PROTONIX) 40 MG tablet Take 1 tablet (40 mg total) by mouth daily. 04/07/20  Yes Najeeb Uptain, MD  potassium chloride (KLOR-CON) 10 MEQ tablet Take 1 tablet (10 mEq total) by mouth daily. Take While taking Lasix/furosemide 04/06/20  Yes Amaria Mundorf, MD  rosuvastatin (CRESTOR) 40 MG tablet Take 1 tablet (40 mg total) by mouth daily. 04/06/20  Yes Cambry Spampinato, MD  senna-docusate (SENOKOT-S) 8.6-50 MG tablet Take 2 tablets by mouth at bedtime. 04/06/20 04/06/21 Yes Naturi Alarid, MD  Apixaban Starter Pack, 10mg  and 5mg , (ELIQUIS DVT/PE STARTER PACK) Take as directed on package: start with two-5mg  tablets twice daily for 7 days. On day 8, switch to one-5mg  tablet twice daily. Patient not taking: Reported on 05/01/2020 04/06/20   Roxan Hockey, MD  hydrALAZINE (APRESOLINE) 25 MG tablet Take 1 tablet (25 mg total) by mouth in the morning and at bedtime. Patient not taking: No sig reported 04/06/20 07/05/20  Roxan Hockey, MD  nitroGLYCERIN (NITROSTAT) 0.4 MG SL tablet Place 1 tablet (0.4 mg total) under the tongue every 5 (five) minutes x 3 doses as needed. 01/11/14   Satira Sark, MD      Allergies:     Allergies  Allergen Reactions  . Codeine Nausea And Vomiting  . Tramadol      Physical Exam:   Vitals  Blood pressure (!) 160/62,  pulse 65, temperature 98.3 F (36.8 C), temperature source Oral, resp. rate 18, height 5\' 6"  (1.676 m), weight 101.6 kg, SpO2 100 %.  Physical Examination: General appearance - alert, and in no distress  Mental status - alert, oriented to person, place, and time,  Eyes - sclera anicteric Neck - supple, no JVD elevation , Chest - clear  to auscultation bilaterally, symmetrical air movement, -Breast-scars from bilateral mastectomy Heart - S1 and S2 normal, regular  Abdomen - soft, nontender, nondistended,   Neurological - screening mental status exam normal, neck supple without rigidity, cranial nerves II through XII intact, DTR's normal and symmetric Extremities - no pedal edema noted, intact peripheral pulses  Skin - warm, dry MSK-left upper extremity swelling is not new    Data Review:    CBC Recent Labs  Lab 05/01/20 0824  WBC 8.4  HGB 8.8*  HCT 27.7*  PLT 205  MCV 83.9  MCH 26.7  MCHC 31.8  RDW 16.7*  LYMPHSABS 1.2  MONOABS 0.7  EOSABS 0.2  BASOSABS 0.0   Chemistries  Recent Labs  Lab 05/01/20 0824  NA 139  K 3.4*  CL 102  CO2 24  GLUCOSE 119*  BUN 42*  CREATININE 1.76*  CALCIUM 9.9  AST 26  ALT 14  ALKPHOS 128*  BILITOT 0.6   ------------------------------------------------------------------------------------------------------------------ estimated creatinine clearance is 29.6 mL/min (A) (by C-G formula based on SCr of 1.76 mg/dL (H)). ------------------------------------------------------------------------------------------------------------------ No results for input(s): TSH, T4TOTAL, T3FREE, THYROIDAB in the last 72 hours.  Invalid input(s): FREET3   Coagulation profile No results for input(s): INR, PROTIME in the last 168  hours. ------------------------------------------------------------------------------------------------------------------- No results for input(s): DDIMER in the last 72 hours. -------------------------------------------------------------------------------------------------------------------  Cardiac Enzymes No results for input(s): CKMB, TROPONINI, MYOGLOBIN in the last 168 hours.  Invalid input(s): CK ------------------------------------------------------------------------------------------------------------------    Component Value Date/Time   BNP 92.0 04/04/2020 1339    Urinalysis    Component Value Date/Time   COLORURINE STRAW (A) 05/01/2020 1622   APPEARANCEUR CLEAR 05/01/2020 1622   LABSPEC 1.009 05/01/2020 1622   PHURINE 8.0 05/01/2020 1622   GLUCOSEU NEGATIVE 05/01/2020 1622   HGBUR MODERATE (A) 05/01/2020 1622   BILIRUBINUR NEGATIVE 05/01/2020 1622   BILIRUBINUR neg 04/19/2012 0856   KETONESUR NEGATIVE 05/01/2020 1622   PROTEINUR NEGATIVE 05/01/2020 1622   UROBILINOGEN 0.2 04/19/2012 0856   NITRITE NEGATIVE 05/01/2020 1622   LEUKOCYTESUR NEGATIVE 05/01/2020 1622     Imaging Results:    DG Chest Port 1 View  Result Date: 05/01/2020 CLINICAL DATA:  Upper chest pain radiating to the back with nausea. EXAM: PORTABLE CHEST 1 VIEW COMPARISON:  None. FINDINGS: Enlarged cardiac silhouette. Tortuous and portion of the calcified thoracic aorta. Mildly prominent interstitial markings with normal vasculature. No right pleural fluid seen. Mild predominantly linear density in the left lower lung zone with a small amount of pleural thickening or fluid. Thoracic spine degenerative changes. IMPRESSION: 1. Cardiomegaly and mild chronic interstitial lung disease. 2. Mild left basilar atelectasis or scarring with a small amount of pleural thickening or fluid. Electronically Signed   By: Claudie Revering M.D.   On: 05/01/2020 08:44    Radiological Exams on Admission: DG Chest Port 1  View  Result Date: 05/01/2020 CLINICAL DATA:  Upper chest pain radiating to the back with nausea. EXAM: PORTABLE CHEST 1 VIEW COMPARISON:  None. FINDINGS: Enlarged cardiac silhouette. Tortuous and portion of the calcified thoracic aorta. Mildly prominent interstitial markings with normal vasculature. No right pleural fluid seen. Mild  predominantly linear density in the left lower lung zone with a small amount of pleural thickening or fluid. Thoracic spine degenerative changes. IMPRESSION: 1. Cardiomegaly and mild chronic interstitial lung disease. 2. Mild left basilar atelectasis or scarring with a small amount of pleural thickening or fluid. Electronically Signed   By: Claudie Revering M.D.   On: 05/01/2020 08:44    DVT Prophylaxis -SCD (hold Eliquis)  AM Labs Ordered, also please review Full Orders  Family Communication: Admission, patients condition and plan of care including tests being ordered have been discussed with the patient and daughter Robin [name] who indicate understanding and agree with the plan   Code Status - Full Code  Likely DC to  home  Condition   stable  Roxan Hockey M.D on 05/01/2020 at 7:26 PM Go to [email or web address] -  for contact info  [facility] - Office  [phone]

## 2020-05-01 NOTE — Progress Notes (Signed)
Pt arrived via stretcher from ED to room #333. Pt ambulatory from stretcher to bed, requires standby assist for stability. Tolerated well, denies any dizziness or SOB. VSS. Oriented to room and safety procedures, states understanding. Telemetry on, pure wick in place, call bell within reach.

## 2020-05-01 NOTE — ED Notes (Signed)
Cardiology PA at bedside. 

## 2020-05-01 NOTE — Consult Note (Signed)
@LOGO @   Referring Provider: Dr. Roderic Palau Primary Care Physician:  [name], MD Primary Gastroenterologist:  Dr. Jenetta Downer (previously unassigned)  Date of Admission: 05/01/20 Date of Consultation: 05/01/20  Reason for Consultation:  Anemia  HPI:  Robin [name] is a 80 y.o. year old female with history of NSTEMI, cardiomyopathy, CAD, carotid artery disease, HTN, HLD, hypothyroidism, type 2 diabetes, stroke, recurrent left breast cancer with bony metastasis, and a left upper extremity DVT diagnosed 04/04/2020 now on Eliquis who presented to the emergency room with chief complaint of upper chest pain that radiates into her back with associated nausea that started last night.    ED course:  Mild hypertension, but hemodynamically stable and afebrile.   No acute findings on EKG.  Troponins flat. Labs remarkable for hemoglobin 8.8 (L), hematocrit 27.7 (L), normocytic indices, platelets within normal limits.  Potassium 3.4 (L), creatinine 1.76 (H), BUN 42 (H).  Fecal occult blood positive.  Had an episode of diarrhea last week with 4-5 dark/green tarry stools. Symptoms resolved within 24 hours. Soft and formed. [employer]. Left sided abdominal pain started after diarrhea. About 8/10 in severity. Nothing makes it better. Taking tylenol. No worsening with eating. Pain worsens with BMs. Mild nausea intermittently every other. No vomiting. No bright red blood in the stools. No iron or pepto bismol. On Eliquis. Also on 81 mg aspirin. No other NSAIDs. Denies GERD symptoms. Not clear if she has been taking Pantoprazole.   Has a good appetite.  Hasn't had anything to eat or drink today. Last dose of Eliquis was yesterday.    Colonoscopy in [place]. No history of colon polyps. No prior EGD.   Denies fever, chills, cold or flu like symptoms.  Denies lightheadedness or dizziness.  Admits to weakness and shortness of breath with minimal exertion.  Pain across her chest that radiates to her back is only  associated with moving her right arm.   Past Medical History:  Diagnosis Date  . Breast cancer ([place])    Tamoxifen started 2007  . CAD (coronary artery disease)    DES second diagonal 2/12  . Cardiomyopathy    Probable Takotsubo, LVEF 30-35% 2/12  . Carotid artery disease ([place])   . Essential hypertension   . Hepatic steatosis   . Hyperlipidemia   . Hypothyroidism   . NSTEMI (non-ST elevated myocardial infarction) ([place])    2/12  . Pancreas divisum   . Stroke ([place])    (2000) residual left-sided weakness  . Type 2 diabetes mellitus ([place])     Past Surgical History:  Procedure Laterality Date  . ABDOMINAL HYSTERECTOMY    . CAROTID ENDARTERECTOMY     Left  . CHOLECYSTECTOMY    . JOINT REPLACEMENT     Right knee replacement- [facility]. [facility]  . MASTECTOMY     Bilateral    Prior to Admission medications   Medication Sig Start Date End Date Taking? Authorizing Provider  acetaminophen (TYLENOL) 325 MG tablet Take 2 tablets (650 mg total) by mouth every 6 (six) hours as needed for mild pain, fever or headache (or Fever >/= 101). 04/06/20  Yes Emokpae, Courage, MD  amLODipine (NORVASC) 10 MG tablet Take 1 tablet (10 mg total) by mouth daily. 04/06/20  Yes Emokpae, Courage, MD  apixaban (ELIQUIS) 5 MG TABS tablet Take 1 tablet (5 mg total) by mouth 2 (two) times daily. Start after you complete the initial starter pack of Eliquis 05/06/20  Yes Roxan Hockey, MD  aspirin 81 MG  tablet Take 1 tablet (81 mg total) by mouth daily with breakfast. 04/06/20  Yes Emokpae, Courage, MD  carvedilol (COREG) 12.5 MG tablet Take 1 tablet (12.5 mg total) by mouth 2 (two) times daily with a meal. 04/06/20  Yes Emokpae, Courage, MD  Cholecalciferol (VITAMIN D3) 125 MCG (5000 UT) CAPS TAKE 1 CAPSULE BY MOUTH ONCE DAILY Patient taking differently: Take 1 capsule by mouth daily. 01/14/18  Yes Nida, Marella Chimes, MD  furosemide (LASIX) 20 MG tablet Take 1 tablet (20 mg total) by mouth daily. 04/20/20   Yes Verta Ellen., NP  gabapentin (NEURONTIN) 100 MG capsule Take 1 capsule (100 mg total) by mouth 3 (three) times daily. 03/01/20  Yes Carole Civil, MD  HYDROcodone-acetaminophen (NORCO/VICODIN) 5-325 MG tablet Take 1 tablet by mouth every 12 (twelve) hours as needed for moderate pain. 04/12/20  Yes Derek Jack, MD  isosorbide mononitrate (IMDUR) 30 MG 24 hr tablet Take 1 tablet (30 mg total) by mouth daily. 04/07/20  Yes Roxan Hockey, MD  levothyroxine (SYNTHROID) 137 MCG tablet Take 1 tablet (137 mcg total) by mouth daily before breakfast. 04/06/20  Yes Emokpae, Courage, MD  losartan-hydrochlorothiazide (HYZAAR) 50-12.5 MG tablet Take 1 tablet by mouth daily. 04/06/20  Yes Emokpae, Courage, MD  Omega-3 Fatty Acids 300 MG CAPS Take 1 capsule (300 mg total) by mouth 2 (two) times daily. 04/24/11  Yes Serpe, Burna Forts, PA-C  pantoprazole (PROTONIX) 40 MG tablet Take 1 tablet (40 mg total) by mouth daily. 04/07/20  Yes Emokpae, Courage, MD  potassium chloride (KLOR-CON) 10 MEQ tablet Take 1 tablet (10 mEq total) by mouth daily. Take While taking Lasix/furosemide 04/06/20  Yes Emokpae, Courage, MD  rosuvastatin (CRESTOR) 40 MG tablet Take 1 tablet (40 mg total) by mouth daily. 04/06/20  Yes Emokpae, Courage, MD  senna-docusate (SENOKOT-S) 8.6-50 MG tablet Take 2 tablets by mouth at bedtime. 04/06/20 04/06/21 Yes Emokpae, Courage, MD  Apixaban Starter Pack, 10mg  and 5mg , (ELIQUIS DVT/PE STARTER PACK) Take as directed on package: start with two-5mg  tablets twice daily for 7 days. On day 8, switch to one-5mg  tablet twice daily. Patient not taking: Reported on 05/01/2020 04/06/20   Roxan Hockey, MD  hydrALAZINE (APRESOLINE) 25 MG tablet Take 1 tablet (25 mg total) by mouth in the morning and at bedtime. Patient not taking: No sig reported 04/06/20 07/05/20  Roxan Hockey, MD  nitroGLYCERIN (NITROSTAT) 0.4 MG SL tablet Place 1 tablet (0.4 mg total) under the tongue every 5 (five) minutes x 3  doses as needed. 01/11/14   Satira Sark, MD    Current Facility-Administered Medications  Medication Dose Route Frequency Provider Last Rate Last Admin  . alum & mag hydroxide-simeth (MAALOX/MYLANTA) 200-200-20 MG/5ML suspension 30 mL  30 mL Oral Once Milton Ferguson, MD       And  . lidocaine (XYLOCAINE) 2 % viscous mouth solution 15 mL  15 mL Oral Once Milton Ferguson, MD       Current Outpatient Medications  Medication Sig Dispense Refill  . acetaminophen (TYLENOL) 325 MG tablet Take 2 tablets (650 mg total) by mouth every 6 (six) hours as needed for mild pain, fever or headache (or Fever >/= 101). 12 tablet 0  . amLODipine (NORVASC) 10 MG tablet Take 1 tablet (10 mg total) by mouth daily. 90 tablet 3  . [START ON 05/06/2020] apixaban (ELIQUIS) 5 MG TABS tablet Take 1 tablet (5 mg total) by mouth 2 (two) times daily. Start after you complete the initial starter  pack of Eliquis 60 tablet 5  . aspirin 81 MG tablet Take 1 tablet (81 mg total) by mouth daily with breakfast. 30 tablet 3  . carvedilol (COREG) 12.5 MG tablet Take 1 tablet (12.5 mg total) by mouth 2 (two) times daily with a meal. 90 tablet 3  . Cholecalciferol (VITAMIN D3) 125 MCG (5000 UT) CAPS TAKE 1 CAPSULE BY MOUTH ONCE DAILY (Patient taking differently: Take 1 capsule by mouth daily.) 90 capsule 1  . furosemide (LASIX) 20 MG tablet Take 1 tablet (20 mg total) by mouth daily. 90 tablet 1  . gabapentin (NEURONTIN) 100 MG capsule Take 1 capsule (100 mg total) by mouth 3 (three) times daily. 90 capsule 2  . HYDROcodone-acetaminophen (NORCO/VICODIN) 5-325 MG tablet Take 1 tablet by mouth every 12 (twelve) hours as needed for moderate pain. 20 tablet 0  . isosorbide mononitrate (IMDUR) 30 MG 24 hr tablet Take 1 tablet (30 mg total) by mouth daily. 90 tablet 2  . levothyroxine (SYNTHROID) 137 MCG tablet Take 1 tablet (137 mcg total) by mouth daily before breakfast. 90 tablet 2  . losartan-hydrochlorothiazide (HYZAAR) 50-12.5 MG  tablet Take 1 tablet by mouth daily. 90 tablet 3  . Omega-3 Fatty Acids 300 MG CAPS Take 1 capsule (300 mg total) by mouth 2 (two) times daily.    . pantoprazole (PROTONIX) 40 MG tablet Take 1 tablet (40 mg total) by mouth daily. 30 tablet 3  . potassium chloride (KLOR-CON) 10 MEQ tablet Take 1 tablet (10 mEq total) by mouth daily. Take While taking Lasix/furosemide 30 tablet 2  . rosuvastatin (CRESTOR) 40 MG tablet Take 1 tablet (40 mg total) by mouth daily. 90 tablet 2  . senna-docusate (SENOKOT-S) 8.6-50 MG tablet Take 2 tablets by mouth at bedtime. 60 tablet 1  . Apixaban Starter Pack, 10mg  and 5mg , (ELIQUIS DVT/PE STARTER PACK) Take as directed on package: start with two-5mg  tablets twice daily for 7 days. On day 8, switch to one-5mg  tablet twice daily. (Patient not taking: Reported on 05/01/2020) 1 each 0  . hydrALAZINE (APRESOLINE) 25 MG tablet Take 1 tablet (25 mg total) by mouth in the morning and at bedtime. (Patient not taking: No sig reported) 180 tablet 3  . nitroGLYCERIN (NITROSTAT) 0.4 MG SL tablet Place 1 tablet (0.4 mg total) under the tongue every 5 (five) minutes x 3 doses as needed. 25 tablet 3    Allergies as of 05/01/2020 - Review Complete 05/01/2020  Allergen Reaction Noted  . Codeine Nausea And Vomiting 04/26/2015  . Tramadol  02/24/2020    Family History  Problem Relation Age of Onset  . Heart attack Sister   . Heart disease Sister   . Heart attack Brother   . Heart disease Brother     Social History   Socioeconomic History  . Marital status: Widowed    Spouse name: Not on file  . Number of children: Not on file  . Years of education: Not on file  . Highest education level: Not on file  Occupational History  . Not on file  Tobacco Use  . Smoking status: Never Smoker  . Smokeless tobacco: Never Used  Vaping Use  . Vaping Use: Never used  Substance and Sexual Activity  . Alcohol use: No    Alcohol/week: 0.0 standard drinks  . Drug use: No  . Sexual  activity: Not on file  Other Topics Concern  . Not on file  Social History Narrative  . Not on file   Social  Determinants of Health   Financial Resource Strain: Not on file  Food Insecurity: Not on file  Transportation Needs: Not on file  Physical Activity: Not on file  Stress: Not on file  Social Connections: Not on file  Intimate Partner Violence: Not on file    Review of Systems: Gen: See HPI CV: See HPI Resp: See HPI GI: See HPI GU : Admits to dysuria.   Heme: See HPI  Physical Exam: Vital signs in last 24 hours: Temp:  [98.4 F (36.9 C)] 98.4 F (36.9 C) (04/12 0818) Pulse Rate:  [62-83] 65 (04/12 1200) Resp:  [11-26] 15 (04/12 1200) BP: (144-172)/(51-93) 172/86 (04/12 1130) SpO2:  [92 %-100 %] 99 % (04/12 1200) Weight:  [101.6 kg] 101.6 kg (04/12 0813)   General:   Alert,  Well-developed, well-nourished, pleasant and cooperative in NAD Head:  Normocephalic and atraumatic. Eyes:  Sclera clear, no icterus.   Conjunctiva pink. Ears:  Normal auditory acuity. Lungs:  Clear throughout to auscultation.   No wheezes, crackles, or rhonchi. No acute distress. Heart:  Regular rate and rhythm.  Mild systolic murmur appreciated at right and left upper sternal border. Abdomen: Normal bowel sounds.  Soft and nondistended.  Mild TTP across the upper abdomen and minimal LLQ and suprapubic region.  No masses, hepatosplenomegaly or hernias noted. Without guarding or rebound.   Rectal:  Deferred  Msk:  Symmetrical without gross deformities. Normal posture. Extremities:  With 1-2+ LE pitting edema in left lower extremity, trace edema in right lower extremity. Per patient this is chronic.  Neurologic:  Alert and  oriented x4;  grossly normal neurologically. Skin:  Intact without significant lesions or rashes. Psych: Normal mood and affect.  Lab Results: Recent Labs    05/01/20 0824  WBC 8.4  HGB 8.8*  HCT 27.7*  PLT 205   BMET Recent Labs    05/01/20 0824  NA 139  K  3.4*  CL 102  CO2 24  GLUCOSE 119*  BUN 42*  CREATININE 1.76*  CALCIUM 9.9   LFT Recent Labs    05/01/20 0824  PROT 7.6  ALBUMIN 3.9  AST 26  ALT 14  ALKPHOS 128*  BILITOT 0.6    Studies/Results: DG Chest Port 1 View  Result Date: 05/01/2020 CLINICAL DATA:  Upper chest pain radiating to the back with nausea. EXAM: PORTABLE CHEST 1 VIEW COMPARISON:  None. FINDINGS: Enlarged cardiac silhouette. Tortuous and portion of the calcified thoracic aorta. Mildly prominent interstitial markings with normal vasculature. No right pleural fluid seen. Mild predominantly linear density in the left lower lung zone with a small amount of pleural thickening or fluid. Thoracic spine degenerative changes. IMPRESSION: 1. Cardiomegaly and mild chronic interstitial lung disease. 2. Mild left basilar atelectasis or scarring with a small amount of pleural thickening or fluid. Electronically Signed   By: Claudie Revering M.D.   On: 05/01/2020 08:44    Impression: 80 year old female with history of NSTEMI, cardiomyopathy, CAD, carotid artery disease, HTN, HLD, hypothyroidism, type 2 diabetes, stroke, recurrent left breast cancer with bony metastasis, and a left upper extremity DVT diagnosed 04/04/2020 now on Eliquis who presented to the emergency room with chief complaint of upper chest pain radiating into her back with associated nausea.  EKG with no acute findings and troponins flat.  Labs remarkable for hemoglobin 8.8 with normocytic indices.  Creatinine 1.76 and BUN 42.  Fecal occult blood positive.  Anemia with heme positive stool: Hemoglobin 8.8 with heme positive stool.  This is  down from hemoglobin 10.3 on 3/23.  BUN elevated though this is in the setting of elevated creatinine.  She reports 24-hour period of dark green/black tarry diarrhea on 04/26/2020 with associated nausea without vomiting and left-sided abdominal pain.  No further melanotic stools and denies bright red blood per rectum.  She has continued  with mild left-sided abdominal discomfort and nausea without vomiting.  Denies GERD symptoms.  It is unclear if she is on pantoprazole daily.  Denies NSAIDs aside from daily 81 mg aspirin.  No alcohol.  No prior EGD.  Reports colonoscopy years ago in [place] with no history of polyps.  Query upper GI/small bowel bleed with differentials including gastritis, duodenitis, PUD, AVMs.  Cannot rule out right-sided colonic lesion.  Additionally, cannot rule out malignancy.  She will need EGD for further evaluation. Will also start PPI BID and order iron studies.   Last dose of Eliquis was yesterday evening. Discussed with Dr. Jenetta Downer who stated EGD can be pursued tomorrow.   Dysuria: Patient reports dysuria x1 week. Mild TTP in suprapubic and LLQ region on exam.  Will evaluate for UTI.   Plan: 1.  Iron panel with ferritin, UA, urine culture. 2.  Hold Eliquis.  3.  PPI BID 4.  Okay from GI standpoint to have clear liquids today. 5.  N.p.o. at midnight. 6.  EGD with propofol with Dr. Laural Golden tomorrow. The risks, benefits, and alternatives have been discussed with the patient in detail. The patient states understanding and desires to proceed.  ASA III 7.  Monitor for overt GI bleeding. 8.  Monitor H/H. 9.  Transfuse as needed.    LOS: 0 days    05/01/2020, 2:29 PM   Aliene Altes, [facility]

## 2020-05-02 ENCOUNTER — Observation Stay (HOSPITAL_COMMUNITY): Payer: Medicare PPO

## 2020-05-02 ENCOUNTER — Ambulatory Visit (HOSPITAL_COMMUNITY): Admission: RE | Admit: 2020-05-02 | Payer: Medicare PPO | Source: Ambulatory Visit

## 2020-05-02 ENCOUNTER — Observation Stay (HOSPITAL_COMMUNITY): Payer: Medicare PPO | Admitting: Anesthesiology

## 2020-05-02 ENCOUNTER — Encounter (HOSPITAL_COMMUNITY)
Admission: EM | Disposition: A | Discharge status: Home / Self Care | Payer: Self-pay | Source: Home / Self Care | Attending: Family Medicine

## 2020-05-02 DIAGNOSIS — K922 Gastrointestinal hemorrhage, unspecified: Secondary | ICD-10-CM | POA: Diagnosis not present

## 2020-05-02 DIAGNOSIS — E876 Hypokalemia: Secondary | ICD-10-CM | POA: Diagnosis not present

## 2020-05-02 DIAGNOSIS — K2289 Other specified disease of esophagus: Secondary | ICD-10-CM | POA: Diagnosis not present

## 2020-05-02 DIAGNOSIS — K640 First degree hemorrhoids: Secondary | ICD-10-CM | POA: Diagnosis present

## 2020-05-02 DIAGNOSIS — E1122 Type 2 diabetes mellitus with diabetic chronic kidney disease: Secondary | ICD-10-CM | POA: Diagnosis present

## 2020-05-02 DIAGNOSIS — R9431 Abnormal electrocardiogram [ECG] [EKG]: Secondary | ICD-10-CM | POA: Diagnosis not present

## 2020-05-02 DIAGNOSIS — Z20822 Contact with and (suspected) exposure to covid-19: Secondary | ICD-10-CM | POA: Diagnosis present

## 2020-05-02 DIAGNOSIS — Z96651 Presence of right artificial knee joint: Secondary | ICD-10-CM | POA: Diagnosis present

## 2020-05-02 DIAGNOSIS — E039 Hypothyroidism, unspecified: Secondary | ICD-10-CM | POA: Diagnosis present

## 2020-05-02 DIAGNOSIS — C7951 Secondary malignant neoplasm of bone: Secondary | ICD-10-CM | POA: Diagnosis present

## 2020-05-02 DIAGNOSIS — K635 Polyp of colon: Secondary | ICD-10-CM | POA: Diagnosis not present

## 2020-05-02 DIAGNOSIS — R079 Chest pain, unspecified: Secondary | ICD-10-CM | POA: Diagnosis present

## 2020-05-02 DIAGNOSIS — D649 Anemia, unspecified: Secondary | ICD-10-CM | POA: Diagnosis not present

## 2020-05-02 DIAGNOSIS — Z86718 Personal history of other venous thrombosis and embolism: Secondary | ICD-10-CM | POA: Diagnosis not present

## 2020-05-02 DIAGNOSIS — Z170 Estrogen receptor positive status [ER+]: Secondary | ICD-10-CM | POA: Diagnosis not present

## 2020-05-02 DIAGNOSIS — E785 Hyperlipidemia, unspecified: Secondary | ICD-10-CM | POA: Diagnosis present

## 2020-05-02 DIAGNOSIS — I5032 Chronic diastolic (congestive) heart failure: Secondary | ICD-10-CM | POA: Diagnosis present

## 2020-05-02 DIAGNOSIS — I130 Hypertensive heart and chronic kidney disease with heart failure and stage 1 through stage 4 chronic kidney disease, or unspecified chronic kidney disease: Secondary | ICD-10-CM | POA: Diagnosis present

## 2020-05-02 DIAGNOSIS — I69354 Hemiplegia and hemiparesis following cerebral infarction affecting left non-dominant side: Secondary | ICD-10-CM | POA: Diagnosis not present

## 2020-05-02 DIAGNOSIS — K449 Diaphragmatic hernia without obstruction or gangrene: Secondary | ICD-10-CM | POA: Diagnosis not present

## 2020-05-02 DIAGNOSIS — C50912 Malignant neoplasm of unspecified site of left female breast: Secondary | ICD-10-CM | POA: Diagnosis present

## 2020-05-02 DIAGNOSIS — I252 Old myocardial infarction: Secondary | ICD-10-CM | POA: Diagnosis not present

## 2020-05-02 DIAGNOSIS — Z9013 Acquired absence of bilateral breasts and nipples: Secondary | ICD-10-CM | POA: Diagnosis not present

## 2020-05-02 DIAGNOSIS — N1831 Chronic kidney disease, stage 3a: Secondary | ICD-10-CM | POA: Diagnosis present

## 2020-05-02 DIAGNOSIS — T182XXA Foreign body in stomach, initial encounter: Secondary | ICD-10-CM

## 2020-05-02 DIAGNOSIS — I429 Cardiomyopathy, unspecified: Secondary | ICD-10-CM | POA: Diagnosis present

## 2020-05-02 DIAGNOSIS — I2510 Atherosclerotic heart disease of native coronary artery without angina pectoris: Secondary | ICD-10-CM | POA: Diagnosis present

## 2020-05-02 DIAGNOSIS — K5730 Diverticulosis of large intestine without perforation or abscess without bleeding: Secondary | ICD-10-CM | POA: Diagnosis present

## 2020-05-02 DIAGNOSIS — K921 Melena: Secondary | ICD-10-CM | POA: Diagnosis present

## 2020-05-02 DIAGNOSIS — D62 Acute posthemorrhagic anemia: Secondary | ICD-10-CM | POA: Diagnosis present

## 2020-05-02 HISTORY — PX: ESOPHAGOGASTRODUODENOSCOPY (EGD) WITH PROPOFOL: SHX5813

## 2020-05-02 LAB — ECHOCARDIOGRAM COMPLETE
Area-P 1/2: 2.08 cm2
Height: 66 in
S' Lateral: 1.6 cm
Weight: 3584 oz

## 2020-05-02 LAB — CBC
HCT: 23.8 % — ABNORMAL LOW (ref 36.0–46.0)
Hemoglobin: 7.2 g/dL — ABNORMAL LOW (ref 12.0–15.0)
MCH: 26.1 pg (ref 26.0–34.0)
MCHC: 30.3 g/dL (ref 30.0–36.0)
MCV: 86.2 fL (ref 80.0–100.0)
Platelets: 180 10*3/uL (ref 150–400)
RBC: 2.76 MIL/uL — ABNORMAL LOW (ref 3.87–5.11)
RDW: 16.8 % — ABNORMAL HIGH (ref 11.5–15.5)
WBC: 7.1 10*3/uL (ref 4.0–10.5)
nRBC: 0 % (ref 0.0–0.2)

## 2020-05-02 LAB — BASIC METABOLIC PANEL
Anion gap: 11 (ref 5–15)
BUN: 34 mg/dL — ABNORMAL HIGH (ref 8–23)
CO2: 24 mmol/L (ref 22–32)
Calcium: 9.5 mg/dL (ref 8.9–10.3)
Chloride: 103 mmol/L (ref 98–111)
Creatinine, Ser: 1.56 mg/dL — ABNORMAL HIGH (ref 0.44–1.00)
GFR, Estimated: 33 mL/min — ABNORMAL LOW (ref >60)
Glucose, Bld: 105 mg/dL — ABNORMAL HIGH (ref 70–99)
Potassium: 3.3 mmol/L — ABNORMAL LOW (ref 3.5–5.1)
Sodium: 138 mmol/L (ref 135–145)

## 2020-05-02 LAB — SARS CORONAVIRUS 2 (TAT 6-24 HRS): SARS Coronavirus 2: NEGATIVE

## 2020-05-02 LAB — ABO/RH: ABO/RH(D): O POS

## 2020-05-02 LAB — HEMOGLOBIN AND HEMATOCRIT, BLOOD
HCT: 28 % — ABNORMAL LOW (ref 36.0–46.0)
Hemoglobin: 8.8 g/dL — ABNORMAL LOW (ref 12.0–15.0)

## 2020-05-02 LAB — OCCULT BLOOD X 1 CARD TO LAB, STOOL: Fecal Occult Bld: POSITIVE — AB

## 2020-05-02 LAB — GLUCOSE, CAPILLARY
Glucose-Capillary: 96 mg/dL (ref 70–99)
Glucose-Capillary: 92 mg/dL (ref 70–99)

## 2020-05-02 LAB — PREPARE RBC (CROSSMATCH)

## 2020-05-02 SURGERY — ESOPHAGOGASTRODUODENOSCOPY (EGD) WITH PROPOFOL
Anesthesia: General

## 2020-05-02 MED ORDER — PROPOFOL 10 MG/ML IV BOLUS
INTRAVENOUS | Status: DC | PRN
  Administered 2020-05-02: 30 mg via INTRAVENOUS
  Administered 2020-05-02 (×2): 50 mg via INTRAVENOUS

## 2020-05-02 MED ORDER — LIDOCAINE HCL (CARDIAC) PF 100 MG/5ML IV SOSY
PREFILLED_SYRINGE | INTRAVENOUS | Status: DC | PRN
  Administered 2020-05-02: 50 mg via INTRATRACHEAL

## 2020-05-02 MED ORDER — LACTATED RINGERS IV SOLN: INTRAVENOUS | Status: DC

## 2020-05-02 MED ORDER — POTASSIUM CHLORIDE CRYS ER 20 MEQ PO TBCR
40.0000 meq | EXTENDED_RELEASE_TABLET | ORAL | Status: AC
  Administered 2020-05-02 (×2): 40 meq via ORAL
  Filled 2020-05-02 (×2): qty 2

## 2020-05-02 MED ORDER — FUROSEMIDE 10 MG/ML IJ SOLN
20.0000 mg | Freq: Once | INTRAMUSCULAR | Status: AC
  Administered 2020-05-02: 20 mg via INTRAVENOUS
  Filled 2020-05-02: qty 2

## 2020-05-02 MED ORDER — SODIUM CHLORIDE 0.9% IV SOLUTION: Freq: Once | INTRAVENOUS | Status: AC

## 2020-05-02 MED ORDER — PROPOFOL 500 MG/50ML IV EMUL
INTRAVENOUS | Status: DC | PRN
  Administered 2020-05-02: 50 ug/kg/min via INTRAVENOUS

## 2020-05-02 MED ORDER — PEG 3350-KCL-NA BICARB-NACL 420 G PO SOLR
4000.0000 mL | Freq: Once | ORAL | Status: AC
  Administered 2020-05-02: 4000 mL via ORAL

## 2020-05-02 NOTE — Anesthesia Preprocedure Evaluation (Signed)
Anesthesia Evaluation  Patient identified by MRN, date of birth, ID band Patient awake    Reviewed: Allergy & Precautions, NPO status , Patient's Chart, lab work & pertinent test results  Airway Mallampati: II  TM Distance: >3 FB Neck ROM: Full    Dental  (+) Missing, Dental Advisory Given   Pulmonary neg pulmonary ROS,    Pulmonary exam normal breath sounds clear to auscultation       Cardiovascular Exercise Tolerance: Poor hypertension, Pt. on medications + CAD, + Past MI, + Cardiac Stents, + Peripheral Vascular Disease (carotid endarterectomy ) and + DVT  Normal cardiovascular exam Rhythm:Regular Rate:Normal  1. Left ventricular ejection fraction, by estimation, is 60 to 65%. The left ventricle has normal function. The left ventricle has no regional wall motion abnormalities. There is mild left ventricular hypertrophy. Left ventricular diastolic parameters are consistent with Grade I diastolic dysfunction (impaired relaxation). Elevated left atrial pressure.  2. Right ventricular systolic function is normal. The right ventricular size is normal.  3. Left atrial size was mildly dilated.  4. The mitral valve is normal in structure. Trivial mitral valve  regurgitation. No evidence of mitral stenosis.  5. The aortic valve is tricuspid. There is mild calcification of the  aortic valve. There is mild thickening of the aortic valve. Aortic valve regurgitation is not visualized. No aortic stenosis is present.  6. The inferior vena cava is normal in size with greater than 50% respiratory variability, suggesting right atrial pressure of 3 mmHg.    Neuro/Psych  Neuromuscular disease CVA, Residual Symptoms    GI/Hepatic Neg liver ROS, Acute GI bleeding   Endo/Other  diabetes, Well Controlled, Type 2Hypothyroidism   Renal/GU negative Renal ROS     Musculoskeletal  (+) Arthritis , Osteoarthritis,    Abdominal   Peds   Hematology  (+) anemia ,   Anesthesia Other Findings Left Breast cancer  Reproductive/Obstetrics                            Anesthesia Physical Anesthesia Plan  ASA: III  Anesthesia Plan: General   Post-op Pain Management:    Induction: Intravenous  PONV Risk Score and Plan: Propofol infusion  Airway Management Planned: Nasal Cannula and Natural Airway  Additional Equipment:   Intra-op Plan:   Post-operative Plan: Possible Post-op intubation/ventilation  Informed Consent: I have reviewed the patients History and Physical, chart, labs and discussed the procedure including the risks, benefits and alternatives for the proposed anesthesia with the patient or authorized representative who has indicated his/her understanding and acceptance.     Dental advisory given  Plan Discussed with: CRNA and Surgeon  Anesthesia Plan Comments:         Anesthesia Quick Evaluation

## 2020-05-02 NOTE — Transfer of Care (Signed)
Immediate Anesthesia Transfer of Care Note  Patient: Robin [name]  Procedure(s) Performed: ESOPHAGOGASTRODUODENOSCOPY (EGD) WITH PROPOFOL (N/A )  Patient Location: PACU  Anesthesia Type:General  Level of Consciousness: drowsy  Airway & Oxygen Therapy: Patient Spontanous Breathing  Post-op Assessment: Report given to RN and Post -op Vital signs reviewed and stable  Post vital signs: Reviewed and stable  Last Vitals:  Vitals Value Taken Time  BP    Temp    Pulse 57   Resp 18   SpO2 96     Last Pain:  Vitals:   05/02/20 1258  TempSrc:   PainSc: 0-No pain         Complications: No complications documented.

## 2020-05-02 NOTE — Progress Notes (Signed)
First unit of blood completed right before transferring to ENDO RM 3 , total of 217mL. Reported to anesthesia Christy Gentles and [place],RN and they are aware of Lasix order.

## 2020-05-02 NOTE — Progress Notes (Signed)
Talk with patient and her 2 daughters were at bedside.  They are all in agreement to proceed with diagnostic colonoscopy since no bleeding lesion identified on EGD.  Posttransfusion hemoglobin is 8.8 g.

## 2020-05-02 NOTE — Progress Notes (Addendum)
Brief Progress Note  Briefly spoke with patient this morning. Her hemoglobin has declined to 7.2 this morning Potassium low at 3.3. Denies brbpr or melena. Abdominal pain seems a little worse today though she is resting comfortably. Still worse in the epigastric area though she does have mild lower abdominal discomfort. She tolerated clear liquids yesterday and has been NPO since midnight. No nausea or vomiting. On exam, she has mild to moderate TTP across the upper abdomen and mild TTP in the LLQ. No rebound or guarding. Abdomen is soft. WBC count remains normal.   I also spoke with Dr. Denton Brick regarding echocardiogram and if cardiology was planning to see patient this admission.  Dr. Denton Brick states she has no acute cardiac issues at this time, and cardiology does not have plans to see her.  Echocardiogram had been requested by oncology as an outpatient as they are considering starting chemo in the near future due to recurrent breast cancer.  Unfortunately, as she presented to the emergency room yesterday, she missed her appointment for echocardiogram that was already scheduled.  Echocardiogram has not yet been completed, but would not expect this to limit our ability to proceed with EGD as her last echo in October 2021 looked okay.  Plan:  1 unit PRBCs has been ordered by hospitalist   We will plan to proceed with EGD with propofol with Dr. Laural Golden today after transfusion is complete. The risks, benefits, and alternatives have been discussed with the patient in detail. The patient states understanding and desires to proceed.   Continue PPI BID.   May need to consider CT this afternoon pending EGD findings to further evaluate her abdominal pain.    Aliene Altes, PA-C [facility]enter[facility]

## 2020-05-02 NOTE — Progress Notes (Signed)
Patient Demographics:    Robin [name], is a 80 y.o. female, DOB - [birth year], PPI:[id number]  Admit date - 05/01/2020   Admitting Physician Shivon Hackel Denton Brick, MD  Outpatient Primary MD for the patient is [name], MD  LOS - 0   Chief Complaint  Patient presents with  . Chest Pain        Subjective:    Robin [name] today has no fevers, no emesis,  No chest pain,  -Tolerated EGD well,  -Abdominal discomfort is not worse  Assessment  & Plan :    Principal Problem:   Acute GI bleeding Active Problems:   Acute DVT (deep venous thrombosis) --LtUE---03/2020   Malignant neoplasm of left breast in female, estrogen receptor positive ([place])   Acute on chronic anemia Due to Acute Blood Loss/Gi Bleed   Secondary cardiomyopathy ([place])   Hypothyroidism   Essential hypertension, benign  Brief Summary:- 80 y.o. female with past medical history ofHTN, HLD, PAD-carotid artery stenosis status post endarterectomy, CAD,HFpEF/chronic grade 1 diastolic dysfunction,h/oCVA, h/o Lt UE DVT, hypothyroidism, history of Recurrent breast cancer --patient had right breast cancer in 1987 and left breast cancer in 2007 both treated with mastectomies without chemo or radiation--in remissionsince 2014, now with recurrence of breast cancer in 2022 with metastasis presents to the ED with dizziness , chest discomfort and dyspnea on exertion in the setting of loose stools, stools are heme positive in the ED -Patient has been  on Eliquis for left upper extremity DVT -Hemoglobin was 10.3 couple weeks ago  A/p 1)Acute GI bleed--- leading to acute on chronic symptomatic anemia -Patient with heme positive stools, dyspnea on exertion dizziness-while on Eliquis -Hemoglobin this admission 8.8 >>7.2  (was 10.3 couple weeks ago)  -Serum iron is 84, ferritin is 34, check B12 and folate -Hold Eliquis -GI consult appreciated -EGD on  05/02/2020 without significant findings to explain blood loss -Plan is for colonoscopy on 05/03/2020 -Transfuse 1 unit of PRBC on 05/02/2020  2)Lt UE DVT- dxed 03/2020----with venous Dopplers at that time showing occlusive thrombus involving the left internal jugular vein, left subclavian vein, left axillary vein, left brachial vein and left basilic vein -Hold Eliquis due to #1 above -Anticipate lifelong anticoagulation given underlying malignancy  3)Recurrentstage IV left-sided breast cancer with bony metastasis -----pt sees oncologist Dr. Delton Coombes S/pbiopsy on 04/05/2020,pathology is consistent with adenocarcinoma -Awaiting outpatient PET scan -history of Recurrent breast cancer --patient had right breast cancer in 1987 and left breast cancer in 2007 both treated with mastectomies without chemo or radiation--in remissionsince 2014, now with recurrence of breast cancer in 2022 with metastasis ---Patientused to follow with Dr. Carl Best from [facility],patient now sees Dr.Katragadda  3)HTN---Hold amlodipine and Losartan/ HCTZ, c/n  Coreg ,  hydralazine 50 mg3 times daily and isosorbide  4)H/o CAD/PAD--status post prior carotid endarterectomy-- aspirin and Eliquis on hold- Continue  Crestor as well as Coreg  5)Hypothyroidism--stable, continue levothyroxine 137 mcg daily  6)Social/ethics---plan of care discussed with patient and her daughter [name] who is at bedside and patient's other daughter [name] is on the phone -Patient is full code with full scope of treatment  7)hypokalemia---  replace potassium  8)HFpEF--history of chronic diastolic dysfunction CHF EF 60 to 65% based on echo from October 2021 -Repeat  echo from 05/02/2020 with EF of 60 to 65% with grade 1 diastolic dysfunction and no regional wall motion normalities  9)AKI----acute kidney injury on CKD 3A-    --Creatinine c down to 1.5/from 1.76, patient was previously around 1.2 -Hold losartan HCTZ, hold  Lasix  , renally adjust medications, avoid nephrotoxic agents / dehydration  / hypotension  10)PAD-carotid artery stenosis status post endarterectomy/CAD--hold aspirin, continue Coreg, crestor and isosorbide  11)Social/Ethics--plan of care and advanced directives discussed with pt and daughter [name] --- patient is a full code   Code Status:-Code Status: Full Code  Family Communication: (patient is alert, awake and coherent)  Discussed withdaughtersBarbara and [name]  Consults :Gi Procedures:- EGD 05/02/2020 -Colonoscopy 05/03/2020  Disposition/Need for in-Hospital Stay- patient unable to be discharged at this time due to symptomatic anemia due to acute GI bleeding requiring transfusion of PRBC in a patient with recent DVT requiring for anticoagulation-- -- Dispo: The patient is from: Home  Anticipated d/c is to: Home  Anticipated d/c date is: 2 days  Patient currently is not medically stable to d/c. Barriers: Not Clinically Stable-   DVT Prophylaxis  :   - SCDs   SCDs Start: 05/01/20 1845 Place TED hose Start: 05/01/20 1845    Lab Results  Component Value Date   PLT 180 05/02/2020    Inpatient Medications  Scheduled Meds: . carvedilol  12.5 mg Oral BID WC  . cholecalciferol  5,000 Units Oral Daily  . furosemide  20 mg Intravenous Once  . gabapentin  100 mg Oral TID  . hydrALAZINE  25 mg Oral TID  . isosorbide mononitrate  30 mg Oral Daily  . levothyroxine  137 mcg Oral QAC breakfast  . omega-3 acid ethyl esters  1 capsule Oral Daily  . pantoprazole (PROTONIX) IV  40 mg Intravenous Q12H  . polyethylene glycol-electrolytes  4,000 mL Oral Once  . potassium chloride  40 mEq Oral Q3H  . rosuvastatin  40 mg Oral Daily  . senna-docusate  2 tablet Oral QHS  . sodium chloride flush  3 mL Intravenous Q12H  . sodium chloride flush  3 mL Intravenous Q12H   Continuous Infusions: . sodium chloride     PRN  Meds:.sodium chloride, acetaminophen **OR** acetaminophen, bisacodyl, HYDROcodone-acetaminophen, labetalol, ondansetron **OR** ondansetron (ZOFRAN) IV, polyethylene glycol, sodium chloride flush, traZODone    Anti-infectives (From admission, onward)   None        Objective:   Vitals:   05/02/20 1315 05/02/20 1322 05/02/20 1330 05/02/20 1347  BP: (!) 99/47 (!) 146/63 (!) 174/72 (!) 108/41  Pulse: (!) 55  (!) 57 (!) 58  Resp: $Remo'17  14 16  'xiqLS$ Temp: 97.8 F (36.6 C)     TempSrc:      SpO2: 96%  99% 99%  Weight:      Height:        Wt Readings from Last 3 Encounters:  05/01/20 101.6 kg  04/12/20 101.9 kg  04/04/20 92.5 kg     Intake/Output Summary (Last 24 hours) at 05/02/2020 1449 Last data filed at 05/02/2020 1310 Gross per 24 hour  Intake 866 ml  Output 2 ml  Net 864 ml     Physical Exam  Gen:- Awake Alert,  Obese, in no apparent distress  HEENT:- [place].AT, No sclera icterus Neck-Supple Neck,No JVD,.  Lungs-  CTAB , fair symmetrical air movement -Breast-scars from prior bilateral mastectomy CV- S1, S2 normal, regular  Abd-  +ve B.Sounds, Abd Soft, No tenderness,    Extremity/Skin:- No  edema, pedal pulses present  Psych-affect is appropriate, oriented x3 Neuro-no new focal deficits, no tremors   Data Review:   Micro Results Recent Results (from the past 240 hour(s))  SARS CORONAVIRUS 2 (TAT 6-24 HRS) Nasopharyngeal Nasopharyngeal Swab     Status: None   Collection Time: 05/01/20  4:20 PM   Specimen: Nasopharyngeal Swab  Result Value Ref Range Status   SARS Coronavirus 2 NEGATIVE NEGATIVE Final    Comment: (NOTE) SARS-CoV-2 target nucleic acids are NOT DETECTED.  The SARS-CoV-2 RNA is generally detectable in upper and lower respiratory specimens during the acute phase of infection. Negative results do not preclude SARS-CoV-2 infection, do not rule out co-infections with other pathogens, and should not be used as the sole basis for treatment or other patient  management decisions. Negative results must be combined with clinical observations, patient history, and epidemiological information. The expected result is Negative.  Fact Sheet for Patients: SugarRoll.be  Fact Sheet for Healthcare Providers: https://www.woods-mathews.com/  This test is not yet approved or cleared by the [place] FDA and  has been authorized for detection and/or diagnosis of SARS-CoV-2 by FDA under an Emergency Use Authorization (EUA). This EUA will remain  in effect (meaning this test can be used) for the duration of the COVID-19 declaration under Se ction 564(b)(1) of the Act, 21 U.S.C. section 360bbb-3(b)(1), unless the authorization is terminated or revoked sooner.  Performed at [facility], [place] [address] [place], [place] [address]     Radiology Reports CT Angio Chest PE W and/or Wo Contrast  Result Date: 04/04/2020 CLINICAL DATA:  80 year old female with shortness of breath for the past 2 weeks, worsening over the past 2-3 days. Evaluate for pulmonary embolism. History of breast cancer status post bilateral mastectomy in 2007. EXAM: CT ANGIOGRAPHY CHEST WITH CONTRAST TECHNIQUE: Multidetector CT imaging of the chest was performed using the standard protocol during bolus administration of intravenous contrast. Multiplanar CT image reconstructions and MIPs were obtained to evaluate the vascular anatomy. CONTRAST:  54mL OMNIPAQUE IOHEXOL 350 MG/ML SOLN COMPARISON:  No priors. FINDINGS: Comment: Today's study is limited by suboptimal contrast bolus and extends patient respiratory motion. Cardiovascular: Within the limitations of today's examination, there is no central, lobar or proximal segmental sized pulmonary embolism. Distal segmental and subsegmental sized pulmonary embolism cannot be entirely excluded on the basis of today's limited examination. Heart size is mildly enlarged. There is no significant pericardial  fluid, thickening or pericardial calcification. There is aortic atherosclerosis, as well as atherosclerosis of the great vessels of the mediastinum and the coronary arteries, including calcified atherosclerotic plaque in the left main, left anterior descending, left circumflex and right coronary arteries. Mediastinum/Nodes: No pathologically enlarged mediastinal or hilar lymph nodes. Esophagus is unremarkable in appearance. No axillary lymphadenopathy. Lungs/Pleura: Patchy areas of mild ground-glass attenuation and interlobular septal thickening are noted throughout the lungs bilaterally. No confluent consolidative airspace disease. No pleural effusions. No definite suspicious appearing pulmonary nodules or masses are noted. Upper Abdomen: Aortic atherosclerosis. Musculoskeletal: In the left chest wall there is an area of soft tissue architectural distortion measuring 4.5 x 3.1 cm (axial image 68 of series 4), concerning for tumor recurrence in this patient status post bilateral modified radical mastectomy for breast cancer. Multiple lytic lesions are noted in the visualized skeleton, most evident in the T1 and T3 vertebral bodies, as well as the manubrium, concerning for potential metastatic lesions. Review of the MIP images confirms the above findings. IMPRESSION: 1. Limited study demonstrating no  central, lobar, or proximal segmental sized pulmonary embolism. Smaller distal segmental and subsegmental sized emboli cannot be entirely excluded. 2. Mild cardiomegaly with evidence of mild interstitial pulmonary edema in the lungs; imaging findings suggestive of congestive heart failure. 3. Irregular masslike area in the left chest wall concerning for potential recurrent breast neoplasm measuring approximately 4.5 x 3.1 cm. There also lytic lesions in T1 and T3 vertebral bodies as well as the manubrium. The possibility of metastatic disease should be considered. Further evaluation with PET-CT should be considered. 4.  Aortic atherosclerosis, in addition to left main and 3 vessel coronary artery disease. Aortic Atherosclerosis (ICD10-I70.0). Electronically Signed   By: Vinnie Langton M.D.   On: 04/04/2020 17:47   US Venous Img Upper Left (DVT Study)  Addendum Date: 04/04/2020   ADDENDUM REPORT: 04/04/2020 14:45 ADDENDUM: These results were called by telephone at the time of interpretation on 04/04/2020 at 2:18 pm to provider GARRETT GREEN , who verbally acknowledged these results. Electronically Signed   By: Markus Daft M.D.   On: 04/04/2020 14:45   Result Date: 04/04/2020 CLINICAL DATA:  80 year old with left arm swelling. History of breast cancer. EXAM: LEFT UPPER EXTREMITY VENOUS DOPPLER ULTRASOUND TECHNIQUE: Gray-scale sonography with graded compression, as well as color Doppler and duplex ultrasound were performed to evaluate the upper extremity deep venous system from the level of the subclavian vein and including the jugular, axillary, basilic, radial, ulnar and upper cephalic vein. Spectral Doppler was utilized to evaluate flow at rest and with distal augmentation maneuvers. COMPARISON:  None. FINDINGS: Contralateral Subclavian Vein:  Patent with color Doppler flow. Internal Jugular Vein: Positive for thrombus. Echogenic thrombus in the left internal jugular vein. This appears to represent occlusive thrombus. Subclavian Vein: Positive for thrombus. Evidence for echogenic occlusive thrombus in the left subclavian vein. Axillary Vein: Positive for thrombus. Expansion of left axillary vein with echogenic occlusive thrombus. Cephalic Vein: No evidence of thrombus. Normal compressibility and color Doppler flow. Basilic Vein: Positive for thrombus. Left basilic vein is non compressible and no significant flow. Brachial Veins: Positive for thrombus involving at least one left brachial vein. This is compatible with occlusive thrombus. Radial Veins: No evidence of thrombus.  Normal compressibility. Ulnar Veins: No evidence of  thrombus. Normal compressibility. Limited evaluation. Subcutaneous edema in this area. Other Findings:  Subcutaneous edema near the wrist. IMPRESSION: Positive for deep venous thrombosis in the left upper extremity. Occlusive thrombus involving the left internal jugular vein, left subclavian vein, left axillary vein, left brachial vein and left basilic vein. Electronically Signed: By: Markus Daft M.D. On: 04/04/2020 14:12   DG Chest Port 1 View  Result Date: 05/01/2020 CLINICAL DATA:  Upper chest pain radiating to the back with nausea. EXAM: PORTABLE CHEST 1 VIEW COMPARISON:  None. FINDINGS: Enlarged cardiac silhouette. Tortuous and portion of the calcified thoracic aorta. Mildly prominent interstitial markings with normal vasculature. No right pleural fluid seen. Mild predominantly linear density in the left lower lung zone with a small amount of pleural thickening or fluid. Thoracic spine degenerative changes. IMPRESSION: 1. Cardiomegaly and mild chronic interstitial lung disease. 2. Mild left basilar atelectasis or scarring with a small amount of pleural thickening or fluid. Electronically Signed   By: Claudie Revering M.D.   On: 05/01/2020 08:44   ECHOCARDIOGRAM COMPLETE  Result Date: 05/02/2020    ECHOCARDIOGRAM REPORT   Patient Name:   YUKO COVENTRY Date of Exam: 05/02/2020 Medical Rec #:  [id number]  Height:       66.0 in Accession #:    [id number]       Weight:       224.0 lb Date of Birth:  1940/12/10        BSA:          2.098 m Patient Age:    73 years         BP:           118/44 mmHg Patient Gender: F                HR:           57 bpm. Exam Location:  [facility] Procedure: 2D Echo Indications:    Abnormal ECG R94.31  History:        Patient has prior history of Echocardiogram examinations, most                 recent 10/27/2019. CAD; Risk Factors:Non-Smoker, Dyslipidemia and                 Hypertension.  Sonographer:    Leavy Cella RDCS (AE) Referring Phys: [id number] Orlyn Odonoghue  IMPRESSIONS  1. Left ventricular ejection fraction, by estimation, is 60 to 65%. The left ventricle has normal function. The left ventricle has no regional wall motion abnormalities. Left ventricular diastolic parameters are consistent with Grade I diastolic dysfunction (impaired relaxation).  2. Right ventricular systolic function is normal. The right ventricular size is normal.  3. Left atrial size was mildly dilated.  4. The mitral valve is abnormal. No evidence of mitral valve regurgitation. No evidence of mitral stenosis. Moderate mitral annular calcification.  5. The aortic valve is tricuspid. There is mild calcification of the aortic valve. Aortic valve regurgitation is not visualized. Mild to moderate aortic valve sclerosis/calcification is present, without any evidence of aortic stenosis.  6. The inferior vena cava is normal in size with greater than 50% respiratory variability, suggesting right atrial pressure of 3 mmHg. FINDINGS  Left Ventricle: Left ventricular ejection fraction, by estimation, is 60 to 65%. The left ventricle has normal function. The left ventricle has no regional wall motion abnormalities. The left ventricular internal cavity size was normal in size. There is  no left ventricular hypertrophy. Left ventricular diastolic parameters are consistent with Grade I diastolic dysfunction (impaired relaxation). Right Ventricle: The right ventricular size is normal. No increase in right ventricular wall thickness. Right ventricular systolic function is normal. Left Atrium: Left atrial size was mildly dilated. Right Atrium: Right atrial size was normal in size. Pericardium: There is no evidence of pericardial effusion. Mitral Valve: The mitral valve is abnormal. There is mild thickening of the mitral valve leaflet(s). There is mild calcification of the mitral valve leaflet(s). Moderate mitral annular calcification. No evidence of mitral valve regurgitation. No evidence  of mitral valve stenosis.  Tricuspid Valve: The tricuspid valve is normal in structure. Tricuspid valve regurgitation is trivial. No evidence of tricuspid stenosis. Aortic Valve: The aortic valve is tricuspid. There is mild calcification of the aortic valve. Aortic valve regurgitation is not visualized. Mild to moderate aortic valve sclerosis/calcification is present, without any evidence of aortic stenosis. Pulmonic Valve: The pulmonic valve was normal in structure. Pulmonic valve regurgitation is not visualized. No evidence of pulmonic stenosis. Aorta: The aortic root is normal in size and structure. Venous: The inferior vena cava is normal in size with greater than 50% respiratory variability, suggesting right atrial pressure of 3 mmHg. IAS/Shunts: No atrial  level shunt detected by color flow Doppler.  LEFT VENTRICLE PLAX 2D LVIDd:         3.70 cm  Diastology LVIDs:         1.60 cm  LV e' medial:    6.42 cm/s LV PW:         1.08 cm  LV E/e' medial:  10.5 LV IVS:        1.09 cm  LV e' lateral:   5.44 cm/s LVOT diam:     2.00 cm  LV E/e' lateral: 12.4 LVOT Area:     3.14 cm  RIGHT VENTRICLE RV S prime:     14.80 cm/s TAPSE (M-mode): 2.4 cm LEFT ATRIUM             Index       RIGHT ATRIUM           Index LA diam:        3.80 cm 1.81 cm/m  RA Area:     12.40 cm LA Vol (A2C):   29.7 ml 14.15 ml/m RA Volume:   31.50 ml  15.01 ml/m LA Vol (A4C):   26.5 ml 12.63 ml/m LA Biplane Vol: 28.7 ml 13.68 ml/m   AORTA Ao Root diam: 2.40 cm MITRAL VALVE                TRICUSPID VALVE MV Area (PHT): 2.08 cm     TR Peak grad:   12.8 mmHg MV Decel Time: 365 msec     TR Vmax:        179.00 cm/s MV E velocity: 67.70 cm/s MV A velocity: 125.00 cm/s  SHUNTS MV E/A ratio:  0.54         Systemic Diam: 2.00 cm Jenkins Rouge MD Electronically signed by Jenkins Rouge MD Signature Date/Time: 05/02/2020/12:29:36 PM    Final    [place] CORE BIOPSY (SOFT TISSUE)  Result Date: 04/05/2020 CLINICAL DATA:  LEFT chest wall mass by CT. Scarring at LEFT chest wall secondary  to prior LEFT breast cancer post BILATERAL mastectomy and LEFT axillary node dissection. EXAM: US GUIDED CORE BIOPSY OF LEFT CHEST WALL MASS COMPARISON:  Ultrasound LEFT chest wall 04/05/2020, CT angio chest 04/04/2020 PROCEDURE: I met with the patient and we discussed the procedure of ultrasound-guided biopsy, including benefits and alternatives. We discussed the high likelihood of a successful procedure. We discussed the risks of the procedure, including infection and bleeding. Informed written consent was given. The usual time-out protocol was performed immediately prior to the procedure. Using sterile technique and 1% Lidocaine as local anesthetic, under direct ultrasound visualization, 18 gauge and 14 gauge spring-loaded devices were used to perform biopsy of LEFT chest wall mass using a inferior to superior approach. Procedure tolerated well by patient without immediate complication. IMPRESSION: Ultrasound guided core biopsies of LEFT chest wall mass. No immediate complication. Electronically Signed   By: Lavonia Dana M.D.   On: 04/05/2020 14:50   [place] CHEST SOFT TISSUE  Result Date: 04/05/2020 CLINICAL DATA:  Abnormal CT chest, LEFT chest wall soft tissue mass, past history of BILATERAL mastectomy, LEFT breast cancer and axillary node dissection EXAM: ULTRASOUND OF CHEST SOFT TISSUES TECHNIQUE: Ultrasound examination of the chest wall soft tissues was performed in the area of clinical concern. COMPARISON:  CT angio chest 04/04/2020 FINDINGS: At the site of clinical concern, and irregular firm hypoechoic deep subcutaneous soft tissue nodule is identified which measures approximately 4.0 x 1.5 x 3.3 cm in size. Abnormality corresponds to  the irregular soft tissue mass seen on CT and is concerning for chest wall recurrence of tumor. Mass appears hypovascular. No definite fluid collections or calcifications. IMPRESSION: 4.0 cm diameter deep subcutaneous soft tissue mass of the LEFT chest wall corresponding to CT  abnormality, question tumor recurrence of the chest wall. Lesion is amenable to percutaneous ultrasound-guided core biopsy. Electronically Signed   By: Lavonia Dana M.D.   On: 04/05/2020 12:17     CBC Recent Labs  Lab 05/01/20 0824 05/02/20 0618 05/02/20 1415  WBC 8.4 7.1  --   HGB 8.8* 7.2* 8.8*  HCT 27.7* 23.8* 28.0*  PLT 205 180  --   MCV 83.9 86.2  --   MCH 26.7 26.1  --   MCHC 31.8 30.3  --   RDW 16.7* 16.8*  --   LYMPHSABS 1.2  --   --   MONOABS 0.7  --   --   EOSABS 0.2  --   --   BASOSABS 0.0  --   --     Chemistries  Recent Labs  Lab 05/01/20 0824 05/02/20 0618  NA 139 138  K 3.4* 3.3*  CL 102 103  CO2 24 24  GLUCOSE 119* 105*  BUN 42* 34*  CREATININE 1.76* 1.56*  CALCIUM 9.9 9.5  AST 26  --   ALT 14  --   ALKPHOS 128*  --   BILITOT 0.6  --    ------------------------------------------------------------------------------------------------------------------ No results for input(s): CHOL, HDL, LDLCALC, TRIG, CHOLHDL, LDLDIRECT in the last 72 hours.  Lab Results  Component Value Date   HGBA1C 5.9 (H) 03/01/2020   ------------------------------------------------------------------------------------------------------------------ No results for input(s): TSH, T4TOTAL, T3FREE, THYROIDAB in the last 72 hours.  Invalid input(s): FREET3 ------------------------------------------------------------------------------------------------------------------ Recent Labs    05/01/20 0824  FERRITIN 34  TIBC 512*  IRON 84    Coagulation profile No results for input(s): INR, PROTIME in the last 168 hours.  No results for input(s): DDIMER in the last 72 hours.  Cardiac Enzymes No results for input(s): CKMB, TROPONINI, MYOGLOBIN in the last 168 hours.  Invalid input(s): CK ------------------------------------------------------------------------------------------------------------------    Component Value Date/Time   BNP 92.0 04/04/2020 1339     Juliany Daughety M.D on 05/02/2020 at 2:49 PM  Go to [email or web address] - for contact info  [facility] - Office  [phone]

## 2020-05-02 NOTE — Progress Notes (Signed)
*  PRELIMINARY RESULTS* Echocardiogram 2D Echocardiogram has been performed.  Leavy Cella 05/02/2020, 11:07 AM

## 2020-05-02 NOTE — Progress Notes (Signed)
Brief EGD note.  Normal hypopharynx. Normal mucosa of the esophagus. Irregular GE junction at 40 cm from the incisors. 2 cm sliding hiatal hernia. Pills in the stomach otherwise normal examination. Normal bulbar and post bulbar mucosa.

## 2020-05-02 NOTE — Op Note (Signed)
[facility] Patient Name: Robin [name] Procedure Date: 05/02/2020 12:12 PM MRN: [id number] Date of Birth: 02-06-1940 Attending MD: Hildred Laser , MD CSN: [id number] Age: 80 Admit Type: Inpatient Procedure:                Upper GI endoscopy Indications:              Melena Providers:                Hildred Laser, MD, Gwenlyn Fudge, RN, Randa Spike, Technician Referring MD:             Roxan Hockey, MD Medicines:                Propofol per Anesthesia Complications:            No immediate complications. Estimated Blood Loss:     Estimated blood loss: none. Procedure:                Pre-Anesthesia Assessment:                           - Prior to the procedure, a History and Physical                            was performed, and patient medications and                            allergies were reviewed. The patient's tolerance of                            previous anesthesia was also reviewed. The risks                            and benefits of the procedure and the sedation                            options and risks were discussed with the patient.                            All questions were answered, and informed consent                            was obtained. Prior Anticoagulants: The patient                            last took Eliquis (apixaban) 2 days prior to the                            procedure. ASA Grade Assessment: III - A patient                            with severe systemic disease. After reviewing the  risks and benefits, the patient was deemed in                            satisfactory condition to undergo the procedure.                           After obtaining informed consent, the endoscope was                            passed under direct vision. Throughout the                            procedure, the patient's blood pressure, pulse, and                            oxygen saturations were  monitored continuously. The                            [id number]-[id number] ([id number]) scope was introduced through the                            mouth, and advanced to the second part of duodenum.                            The upper GI endoscopy was accomplished without                            difficulty. The patient tolerated the procedure                            well. Scope In: 1:05:00 PM Scope Out: 1:09:31 PM Total Procedure Duration: 0 hours 4 minutes 31 seconds  Findings:      The hypopharynx was normal.      The examined esophagus was normal.      The Z-line was irregular and was found 40 cm from the incisors.      A 2 cm hiatal hernia was present.      Pills were found in the gastric body and in the gastric antrum.      The exam of the stomach was otherwise normal.      The duodenal bulb and second portion of the duodenum were normal. Impression:               - Normal hypopharynx.                           - Normal esophagus.                           - Z-line irregular, 40 cm from the incisors.                           - 2 cm hiatal hernia.                           - Pills were found in the stomach.                           -  Normal duodenal bulb and second portion of the                            duodenum.                           - No specimens collected.                           comment: no bleeding lesion noted in UGI tract. Moderate Sedation:      Per Anesthesia Care Recommendation:           - Patient has a contact number available for                            emergencies. The signs and symptoms of potential                            delayed complications were discussed with the                            patient. Return to normal activities tomorrow.                            Written discharge instructions were provided to the                            patient.                           - Clear liquid diet today.                           - Continue present  medications.                           - Perform a colonoscopy tomorrow. Procedure Code(s):        --- Professional ---                           [phone], Esophagogastroduodenoscopy, flexible,                            transoral; diagnostic, including collection of                            specimen(s) by brushing or washing, when performed                            (separate procedure) Diagnosis Code(s):        --- Professional ---                           K22.8, Other specified diseases of esophagus                           K44.9, Diaphragmatic hernia without obstruction or  gangrene                           T18.2XXA, Foreign body in stomach, initial encounter                           K92.1, Melena (includes Hematochezia) CPT copyright 2019 [employer]. All rights reserved. The codes documented in this report are preliminary and upon coder review may  be revised to meet current compliance requirements. Hildred Laser, MD Hildred Laser, MD 05/02/2020 1:23:46 PM This report has been signed electronically. Number of Addenda: 0

## 2020-05-02 NOTE — Anesthesia Postprocedure Evaluation (Signed)
Anesthesia Post Note  Patient: Robin [name]  Procedure(s) Performed: ESOPHAGOGASTRODUODENOSCOPY (EGD) WITH PROPOFOL (N/A )  Patient location during evaluation: PACU Anesthesia Type: General Level of consciousness: oriented Pain management: pain level controlled Vital Signs Assessment: post-procedure vital signs reviewed and stable Respiratory status: spontaneous breathing and respiratory function stable Cardiovascular status: blood pressure returned to baseline and stable Postop Assessment: no apparent nausea or vomiting Anesthetic complications: no   No complications documented.   Last Vitals:  Vitals:   05/02/20 1330 05/02/20 1347  BP: (!) 174/72 (!) 108/41  Pulse: (!) 57 (!) 58  Resp: 14 16  Temp:    SpO2: 99% 99%    Last Pain:  Vitals:   05/02/20 1330  TempSrc:   PainSc: 1                  Lenia Housley C Abrial Arrighi

## 2020-05-02 NOTE — Progress Notes (Signed)
Alert and oriented x 4. Denies pain or discomfort. NPO after MN for possible procedure. Call bell within reach. Needs 1 person standby assist with adls. Continue plan of care.

## 2020-05-02 NOTE — ED Provider Notes (Signed)
[facility] Provider Note   CSN: [id number] Arrival date & time: 05/01/20  2876     History Chief Complaint  Patient presents with  . Chest Pain    Robin [name] is a 80 y.o. female.  Patient complains of chest pain and epigastric pain.  She is on a blood thinner for DVT.  The history is provided by the patient and medical records. No language interpreter was used.  Chest Pain Pain location:  Substernal area Pain quality: aching   Pain radiates to:  Does not radiate Pain severity:  Moderate Onset quality:  Sudden Timing:  Intermittent Progression:  Waxing and waning Chronicity:  New Context: not breathing   Associated symptoms: abdominal pain   Associated symptoms: no back pain, no cough, no fatigue and no headache        Past Medical History:  Diagnosis Date  . Breast cancer ([place])    Tamoxifen started 2007  . CAD (coronary artery disease)    DES second diagonal 2/12  . Cardiomyopathy    Probable Takotsubo, LVEF 30-35% 2/12  . Carotid artery disease ([place])   . Essential hypertension   . Hepatic steatosis   . Hyperlipidemia   . Hypothyroidism   . NSTEMI (non-ST elevated myocardial infarction) ([place])    2/12  . Pancreas divisum   . Stroke ([place])    (2000) residual left-sided weakness  . Type 2 diabetes mellitus [facility])     Patient Active Problem List   Diagnosis Date Noted  . Acute GI bleeding 05/01/2020  . Acute on chronic anemia Due to Acute Blood Loss/Gi Bleed 05/01/2020  . Anemia   . Heme positive stool   . Malignant neoplasm of left breast in female, estrogen receptor positive ([place])   . Mass of left chest wall   . Acute DVT (deep venous thrombosis) --LtUE---03/2020 04/04/2020  . Myalgia and myositis 11/24/2019  . Urinary incontinence 11/24/2019  . Vitamin D deficiency 10/22/2017  . Constipation 11/17/2016  . Prediabetes 07/19/2012  . Tinnitus 12/04/2011  . Hypothyroidism 09/19/2011  . OA (osteoarthritis) of hip 09/19/2011   . Obesity 09/19/2011  . Carotid artery disease ([place]) 04/24/2011  . Peripheral edema 09/17/2010  . Secondary cardiomyopathy ([place]) 04/09/2010  . Coronary atherosclerosis of native coronary artery 04/09/2010  . Essential hypertension, benign 04/09/2010  . Mixed hyperlipidemia 04/09/2010    Past Surgical History:  Procedure Laterality Date  . ABDOMINAL HYSTERECTOMY    . CAROTID ENDARTERECTOMY     Left  . CHOLECYSTECTOMY    . JOINT REPLACEMENT     Right knee [facility]- [facility]. [facility]  . MASTECTOMY     Bilateral     OB History    Gravida  6   Para  6   Term  6   Preterm      AB      Living  6     SAB      IAB      Ectopic      Multiple      Live Births              Family History  Problem Relation Age of Onset  . Heart attack Sister   . Heart disease Sister   . Heart attack Brother   . Heart disease Brother   . Throat cancer Brother   . Colon cancer Neg Hx     Social History   Tobacco Use  . Smoking status: Never Smoker  .  Smokeless tobacco: Never Used  Vaping Use  . Vaping Use: Never used  Substance Use Topics  . Alcohol use: No    Alcohol/week: 0.0 standard drinks  . Drug use: No    Home Medications Prior to Admission medications   Medication Sig Start Date End Date Taking? Authorizing Provider  acetaminophen (TYLENOL) 325 MG tablet Take 2 tablets (650 mg total) by mouth every 6 (six) hours as needed for mild pain, fever or headache (or Fever >/= 101). 04/06/20  Yes Emokpae, Courage, MD  amLODipine (NORVASC) 10 MG tablet Take 1 tablet (10 mg total) by mouth daily. 04/06/20  Yes Emokpae, Courage, MD  apixaban (ELIQUIS) 5 MG TABS tablet Take 1 tablet (5 mg total) by mouth 2 (two) times daily. Start after you complete the initial starter pack of Eliquis 05/06/20  Yes Roxan Hockey, MD  aspirin 81 MG tablet Take 1 tablet (81 mg total) by mouth daily with breakfast. 04/06/20  Yes Emokpae, Courage, MD  carvedilol (COREG) 12.5 MG tablet  Take 1 tablet (12.5 mg total) by mouth 2 (two) times daily with a meal. 04/06/20  Yes Emokpae, Courage, MD  Cholecalciferol (VITAMIN D3) 125 MCG (5000 UT) CAPS TAKE 1 CAPSULE BY MOUTH ONCE DAILY Patient taking differently: Take 1 capsule by mouth daily. 01/14/18  Yes Nida, Marella Chimes, MD  furosemide (LASIX) 20 MG tablet Take 1 tablet (20 mg total) by mouth daily. 04/20/20  Yes Verta Ellen., NP  gabapentin (NEURONTIN) 100 MG capsule Take 1 capsule (100 mg total) by mouth 3 (three) times daily. 03/01/20  Yes Carole Civil, MD  HYDROcodone-acetaminophen (NORCO/VICODIN) 5-325 MG tablet Take 1 tablet by mouth every 12 (twelve) hours as needed for moderate pain. 04/12/20  Yes Derek Jack, MD  isosorbide mononitrate (IMDUR) 30 MG 24 hr tablet Take 1 tablet (30 mg total) by mouth daily. 04/07/20  Yes Roxan Hockey, MD  levothyroxine (SYNTHROID) 137 MCG tablet Take 1 tablet (137 mcg total) by mouth daily before breakfast. 04/06/20  Yes Emokpae, Courage, MD  losartan-hydrochlorothiazide (HYZAAR) 50-12.5 MG tablet Take 1 tablet by mouth daily. 04/06/20  Yes Emokpae, Courage, MD  Omega-3 Fatty Acids 300 MG CAPS Take 1 capsule (300 mg total) by mouth 2 (two) times daily. 04/24/11  Yes Serpe, Burna Forts, PA-C  pantoprazole (PROTONIX) 40 MG tablet Take 1 tablet (40 mg total) by mouth daily. 04/07/20  Yes Emokpae, Courage, MD  potassium chloride (KLOR-CON) 10 MEQ tablet Take 1 tablet (10 mEq total) by mouth daily. Take While taking Lasix/furosemide 04/06/20  Yes Emokpae, Courage, MD  rosuvastatin (CRESTOR) 40 MG tablet Take 1 tablet (40 mg total) by mouth daily. 04/06/20  Yes Emokpae, Courage, MD  senna-docusate (SENOKOT-S) 8.6-50 MG tablet Take 2 tablets by mouth at bedtime. 04/06/20 04/06/21 Yes Emokpae, Courage, MD  Apixaban Starter Pack, 10mg  and 5mg , (ELIQUIS DVT/PE STARTER PACK) Take as directed on package: start with two-5mg  tablets twice daily for 7 days. On day 8, switch to one-5mg  tablet twice  daily. Patient not taking: Reported on 05/01/2020 04/06/20   Roxan Hockey, MD  hydrALAZINE (APRESOLINE) 25 MG tablet Take 1 tablet (25 mg total) by mouth in the morning and at bedtime. Patient not taking: No sig reported 04/06/20 07/05/20  Roxan Hockey, MD  nitroGLYCERIN (NITROSTAT) 0.4 MG SL tablet Place 1 tablet (0.4 mg total) under the tongue every 5 (five) minutes x 3 doses as needed. 01/11/14   Satira Sark, MD    Allergies    Codeine and  Tramadol  Review of Systems   Review of Systems  Constitutional: Negative for appetite change and fatigue.  HENT: Negative for congestion, ear discharge and sinus pressure.   Eyes: Negative for discharge.  Respiratory: Negative for cough.   Cardiovascular: Positive for chest pain.  Gastrointestinal: Positive for abdominal pain. Negative for diarrhea.  Genitourinary: Negative for frequency and hematuria.  Musculoskeletal: Negative for back pain.  Skin: Negative for rash.  Neurological: Negative for seizures and headaches.  Psychiatric/Behavioral: Negative for hallucinations.    Physical Exam Updated Vital Signs BP (!) 108/41 (BP Location: Right Arm)   Pulse (!) 58   Temp 97.8 F (36.6 C)   Resp 16   Ht 5\' 6"  (1.676 m)   Wt 101.6 kg   SpO2 99%   BMI 36.15 kg/m   Physical Exam Vitals and nursing note reviewed.  Constitutional:      Appearance: She is well-developed.  HENT:     Head: Normocephalic.     Nose: Nose normal.  Eyes:     General: No scleral icterus.    Conjunctiva/sclera: Conjunctivae normal.  Neck:     Thyroid: No thyromegaly.  Cardiovascular:     Rate and Rhythm: Normal rate and regular rhythm.     Heart sounds: No murmur heard. No friction rub. No gallop.   Pulmonary:     Breath sounds: No stridor. No wheezing or rales.  Chest:     Chest wall: No tenderness.  Abdominal:     General: There is no distension.     Tenderness: There is abdominal tenderness. There is no rebound.  Genitourinary:     Comments: Rectal exam Brown stool heme positive Musculoskeletal:        General: Normal range of motion.     Cervical back: Neck supple.  Lymphadenopathy:     Cervical: No cervical adenopathy.  Skin:    Findings: No erythema or rash.  Neurological:     Mental Status: She is alert and oriented to person, place, and time.     Motor: No abnormal muscle tone.     Coordination: Coordination normal.  Psychiatric:        Behavior: Behavior normal.     ED Results / Procedures / Treatments   Labs (all labs ordered are listed, but only abnormal results are displayed) Labs Reviewed  CBC WITH DIFFERENTIAL/PLATELET - Abnormal; Notable for the following components:      Result Value   RBC 3.30 (*)    Hemoglobin 8.8 (*)    HCT 27.7 (*)    RDW 16.7 (*)    Abs Immature Granulocytes 0.10 (*)    All other components within normal limits  COMPREHENSIVE METABOLIC PANEL - Abnormal; Notable for the following components:   Potassium 3.4 (*)    Glucose, Bld 119 (*)    BUN 42 (*)    Creatinine, Ser 1.76 (*)    Alkaline Phosphatase 128 (*)    GFR, Estimated 29 (*)    All other components within normal limits  URINALYSIS, ROUTINE W REFLEX MICROSCOPIC - Abnormal; Notable for the following components:   Color, Urine STRAW (*)    Hgb urine dipstick MODERATE (*)    All other components within normal limits  IRON AND TIBC - Abnormal; Notable for the following components:   TIBC 512 (*)    All other components within normal limits  BASIC METABOLIC PANEL - Abnormal; Notable for the following components:   Potassium 3.3 (*)    Glucose, Bld 105 (*)  BUN 34 (*)    Creatinine, Ser 1.56 (*)    GFR, Estimated 33 (*)    All other components within normal limits  CBC - Abnormal; Notable for the following components:   RBC 2.76 (*)    Hemoglobin 7.2 (*)    HCT 23.8 (*)    RDW 16.8 (*)    All other components within normal limits  GLUCOSE, CAPILLARY - Abnormal; Notable for the following components:    Glucose-Capillary 106 (*)    All other components within normal limits  HEMOGLOBIN AND HEMATOCRIT, BLOOD - Abnormal; Notable for the following components:   Hemoglobin 8.8 (*)    HCT 28.0 (*)    All other components within normal limits  POC OCCULT BLOOD, ED - Abnormal; Notable for the following components:   Fecal Occult Bld POSITIVE (*)    All other components within normal limits  SARS CORONAVIRUS 2 (TAT 6-24 HRS)  URINE CULTURE  FERRITIN  GLUCOSE, CAPILLARY  GLUCOSE, CAPILLARY  CBC  RENAL FUNCTION PANEL  MAGNESIUM  TYPE AND SCREEN  ABO/RH  PREPARE RBC (CROSSMATCH)  TROPONIN I (HIGH SENSITIVITY)  TROPONIN I (HIGH SENSITIVITY)    EKG EKG Interpretation  Date/Time:  Tuesday May 01 2020 08:14:31 EDT Ventricular Rate:  80 PR Interval:  192 QRS Duration: 100 QT Interval:  338 QTC Calculation: 390 R Axis:   7 Text Interpretation: Sinus rhythm Atrial premature complex Probable left atrial enlargement Confirmed by Milton Ferguson [phone]) on 05/01/2020 2:01:57 PM   Radiology DG Chest Port 1 View  Result Date: 05/01/2020 CLINICAL DATA:  Upper chest pain radiating to the back with nausea. EXAM: PORTABLE CHEST 1 VIEW COMPARISON:  None. FINDINGS: Enlarged cardiac silhouette. Tortuous and portion of the calcified thoracic aorta. Mildly prominent interstitial markings with normal vasculature. No right pleural fluid seen. Mild predominantly linear density in the left lower lung zone with a small amount of pleural thickening or fluid. Thoracic spine degenerative changes. IMPRESSION: 1. Cardiomegaly and mild chronic interstitial lung disease. 2. Mild left basilar atelectasis or scarring with a small amount of pleural thickening or fluid. Electronically Signed   By: Claudie Revering M.D.   On: 05/01/2020 08:44   ECHOCARDIOGRAM COMPLETE  Result Date: 05/02/2020    ECHOCARDIOGRAM REPORT   Patient Name:   KIORA HALLBERG Date of Exam: 05/02/2020 Medical Rec #:  [id number]        Height:        66.0 in Accession #:    [id number]       Weight:       224.0 lb Date of Birth:  1940/05/13        BSA:          2.098 m Patient Age:    3 years         BP:           118/44 mmHg Patient Gender: F                HR:           57 bpm. Exam Location:  [facility] Procedure: 2D Echo Indications:    Abnormal ECG R94.31  History:        Patient has prior history of Echocardiogram examinations, most                 recent 10/27/2019. CAD; Risk Factors:Non-Smoker, Dyslipidemia and                 Hypertension.  Sonographer:  Leavy Cella RDCS (AE) Referring Phys: [id number] COURAGE EMOKPAE IMPRESSIONS  1. Left ventricular ejection fraction, by estimation, is 60 to 65%. The left ventricle has normal function. The left ventricle has no regional wall motion abnormalities. Left ventricular diastolic parameters are consistent with Grade I diastolic dysfunction (impaired relaxation).  2. Right ventricular systolic function is normal. The right ventricular size is normal.  3. Left atrial size was mildly dilated.  4. The mitral valve is abnormal. No evidence of mitral valve regurgitation. No evidence of mitral stenosis. Moderate mitral annular calcification.  5. The aortic valve is tricuspid. There is mild calcification of the aortic valve. Aortic valve regurgitation is not visualized. Mild to moderate aortic valve sclerosis/calcification is present, without any evidence of aortic stenosis.  6. The inferior vena cava is normal in size with greater than 50% respiratory variability, suggesting right atrial pressure of 3 mmHg. FINDINGS  Left Ventricle: Left ventricular ejection fraction, by estimation, is 60 to 65%. The left ventricle has normal function. The left ventricle has no regional wall motion abnormalities. The left ventricular internal cavity size was normal in size. There is  no left ventricular hypertrophy. Left ventricular diastolic parameters are consistent with Grade I diastolic dysfunction (impaired relaxation). Right  Ventricle: The right ventricular size is normal. No increase in right ventricular wall thickness. Right ventricular systolic function is normal. Left Atrium: Left atrial size was mildly dilated. Right Atrium: Right atrial size was normal in size. Pericardium: There is no evidence of pericardial effusion. Mitral Valve: The mitral valve is abnormal. There is mild thickening of the mitral valve leaflet(s). There is mild calcification of the mitral valve leaflet(s). Moderate mitral annular calcification. No evidence of mitral valve regurgitation. No evidence  of mitral valve stenosis. Tricuspid Valve: The tricuspid valve is normal in structure. Tricuspid valve regurgitation is trivial. No evidence of tricuspid stenosis. Aortic Valve: The aortic valve is tricuspid. There is mild calcification of the aortic valve. Aortic valve regurgitation is not visualized. Mild to moderate aortic valve sclerosis/calcification is present, without any evidence of aortic stenosis. Pulmonic Valve: The pulmonic valve was normal in structure. Pulmonic valve regurgitation is not visualized. No evidence of pulmonic stenosis. Aorta: The aortic root is normal in size and structure. Venous: The inferior vena cava is normal in size with greater than 50% respiratory variability, suggesting right atrial pressure of 3 mmHg. IAS/Shunts: No atrial level shunt detected by color flow Doppler.  LEFT VENTRICLE PLAX 2D LVIDd:         3.70 cm  Diastology LVIDs:         1.60 cm  LV e' medial:    6.42 cm/s LV PW:         1.08 cm  LV E/e' medial:  10.5 LV IVS:        1.09 cm  LV e' lateral:   5.44 cm/s LVOT diam:     2.00 cm  LV E/e' lateral: 12.4 LVOT Area:     3.14 cm  RIGHT VENTRICLE RV S prime:     14.80 cm/s TAPSE (M-mode): 2.4 cm LEFT ATRIUM             Index       RIGHT ATRIUM           Index LA diam:        3.80 cm 1.81 cm/m  RA Area:     12.40 cm LA Vol (A2C):   29.7 ml 14.15 ml/m RA Volume:   31.50 ml  15.01 ml/m LA Vol (A4C):   26.5 ml 12.63  ml/m LA Biplane Vol: 28.7 ml 13.68 ml/m   AORTA Ao Root diam: 2.40 cm MITRAL VALVE                TRICUSPID VALVE MV Area (PHT): 2.08 cm     TR Peak grad:   12.8 mmHg MV Decel Time: 365 msec     TR Vmax:        179.00 cm/s MV E velocity: 67.70 cm/s MV A velocity: 125.00 cm/s  SHUNTS MV E/A ratio:  0.54         Systemic Diam: 2.00 cm Jenkins Rouge MD Electronically signed by Jenkins Rouge MD Signature Date/Time: 05/02/2020/12:29:36 PM    Final     Procedures Procedures   Medications Ordered in ED Medications  pantoprazole (PROTONIX) injection 40 mg ( Intravenous MAR Unhold 05/02/20 1345)  hydrALAZINE (APRESOLINE) tablet 25 mg (25 mg Oral Not Given 05/02/20 1556)  HYDROcodone-acetaminophen (NORCO/VICODIN) 5-325 MG per tablet 1 tablet ( Oral MAR Unhold 05/02/20 1345)  carvedilol (COREG) tablet 12.5 mg (12.5 mg Oral Not Given 05/02/20 1556)  isosorbide mononitrate (IMDUR) 24 hr tablet 30 mg ( Oral MAR Unhold 05/02/20 1345)  rosuvastatin (CRESTOR) tablet 40 mg ( Oral MAR Unhold 05/02/20 1345)  levothyroxine (SYNTHROID) tablet 137 mcg ( Oral MAR Unhold 05/02/20 1345)  senna-docusate (Senokot-S) tablet 2 tablet ( Oral MAR Unhold 05/02/20 1345)  gabapentin (NEURONTIN) capsule 100 mg (100 mg Oral Given 05/02/20 1556)  cholecalciferol (VITAMIN D) tablet 5,000 Units ( Oral MAR Unhold 05/02/20 1345)  omega-3 acid ethyl esters (LOVAZA) capsule 1 g ( Oral MAR Unhold 05/02/20 1345)  sodium chloride flush (NS) 0.9 % injection 3 mL ( Intravenous MAR Unhold 05/02/20 1345)  sodium chloride flush (NS) 0.9 % injection 3 mL ( Intravenous MAR Unhold 05/02/20 1345)  sodium chloride flush (NS) 0.9 % injection 3 mL ( Intravenous MAR Unhold 05/02/20 1345)  0.9 %  sodium chloride infusion ( Intravenous MAR Unhold 05/02/20 1345)  acetaminophen (TYLENOL) tablet 650 mg ( Oral MAR Unhold 05/02/20 1345)    Or  acetaminophen (TYLENOL) suppository 650 mg ( Rectal MAR Unhold 05/02/20 1345)  traZODone (DESYREL) tablet 50 mg ( Oral MAR Unhold  05/02/20 1345)  polyethylene glycol (MIRALAX / GLYCOLAX) packet 17 g ( Oral MAR Unhold 05/02/20 1345)  bisacodyl (DULCOLAX) suppository 10 mg ( Rectal MAR Unhold 05/02/20 1345)  ondansetron (ZOFRAN) tablet 4 mg ( Oral MAR Unhold 05/02/20 1345)    Or  ondansetron (ZOFRAN) injection 4 mg ( Intravenous MAR Unhold 05/02/20 1345)  labetalol (NORMODYNE) injection 10 mg ( Intravenous MAR Unhold 05/02/20 1345)  potassium chloride SA (KLOR-CON) CR tablet 40 mEq (40 mEq Oral Given 05/02/20 1556)  alum & mag hydroxide-simeth (MAALOX/MYLANTA) 200-200-20 MG/5ML suspension 30 mL (30 mLs Oral Given 05/01/20 1517)    And  lidocaine (XYLOCAINE) 2 % viscous mouth solution 15 mL (15 mLs Oral Given 05/01/20 1517)  0.9 %  sodium chloride infusion (Manually program via Guardrails IV Fluids) ( Intravenous New Bag/Given 05/02/20 1029)  furosemide (LASIX) injection 20 mg (20 mg Intravenous Given 05/02/20 1540)  polyethylene glycol-electrolytes (NuLYTELY) solution 4,000 mL (4,000 mLs Oral Given 05/02/20 1556)    ED Course  I have reviewed the triage vital signs and the nursing notes.  Pertinent labs & imaging results that were available during my care of the patient were reviewed by me and considered in my medical decision making (see chart for details). CRITICAL CARE Performed  by: Milton Ferguson Total critical care time: 45 minutes Critical care time was exclusive of separately billable procedures and treating other patients. Critical care was necessary to treat or prevent imminent or life-threatening deterioration. Critical care was time spent personally by me on the following activities: development of treatment plan with patient and/or surrogate as well as nursing, discussions with consultants, evaluation of patient's response to treatment, examination of patient, obtaining history from patient or surrogate, ordering and performing treatments and interventions, ordering and review of laboratory studies, ordering and review of  radiographic studies, pulse oximetry and re-evaluation of patient's condition. Patient with epigastric and chest pain.  I spoke to cardiology and they will consult on the patient if hospitalist requires it.  Patient was put to get an echo today and this can be taken care of by the hospitalist.  Also consult GI about her heme positive stool anemia.  She will stop her Eliquis and be consulted by GI   MDM Rules/Calculators/A&P                          Patient with GI bleed and epigastric discomfort.  She is admitted to medicine with GI consult Final Clinical Impression(s) / ED Diagnoses Final diagnoses:  Atypical chest pain    Rx / DC Orders ED Discharge Orders    None       Milton Ferguson, MD 05/02/20 1754

## 2020-05-03 ENCOUNTER — Encounter (HOSPITAL_COMMUNITY)
Admission: EM | Disposition: A | Discharge status: Home / Self Care | Payer: Self-pay | Source: Home / Self Care | Attending: Family Medicine

## 2020-05-03 ENCOUNTER — Inpatient Hospital Stay (HOSPITAL_COMMUNITY): Payer: Medicare PPO | Admitting: Certified Registered Nurse Anesthetist

## 2020-05-03 ENCOUNTER — Encounter (HOSPITAL_COMMUNITY): Payer: Self-pay | Admitting: Family Medicine

## 2020-05-03 DIAGNOSIS — K922 Gastrointestinal hemorrhage, unspecified: Secondary | ICD-10-CM | POA: Diagnosis not present

## 2020-05-03 DIAGNOSIS — K635 Polyp of colon: Secondary | ICD-10-CM

## 2020-05-03 HISTORY — PX: COLONOSCOPY WITH PROPOFOL: SHX5780

## 2020-05-03 HISTORY — PX: POLYPECTOMY: SHX5525

## 2020-05-03 LAB — CBC
HCT: 28.2 % — ABNORMAL LOW (ref 36.0–46.0)
Hemoglobin: 8.7 g/dL — ABNORMAL LOW (ref 12.0–15.0)
MCH: 26 pg (ref 26.0–34.0)
MCHC: 30.9 g/dL (ref 30.0–36.0)
MCV: 84.4 fL (ref 80.0–100.0)
Platelets: 152 10*3/uL (ref 150–400)
RBC: 3.34 MIL/uL — ABNORMAL LOW (ref 3.87–5.11)
RDW: 16.7 % — ABNORMAL HIGH (ref 11.5–15.5)
WBC: 8.3 10*3/uL (ref 4.0–10.5)
nRBC: 0 % (ref 0.0–0.2)

## 2020-05-03 LAB — TYPE AND SCREEN
ABO/RH(D): O POS
Antibody Screen: NEGATIVE
Unit division: 0

## 2020-05-03 LAB — GLUCOSE, CAPILLARY
Glucose-Capillary: 97 mg/dL (ref 70–99)
Glucose-Capillary: 99 mg/dL (ref 70–99)
Glucose-Capillary: 92 mg/dL (ref 70–99)
Glucose-Capillary: 126 mg/dL — ABNORMAL HIGH (ref 70–99)
Glucose-Capillary: 112 mg/dL — ABNORMAL HIGH (ref 70–99)

## 2020-05-03 LAB — BPAM RBC
Blood Product Expiration Date: 202205162359
ISSUE DATE / TIME: 202204131034
Unit Type and Rh: 5100

## 2020-05-03 LAB — RENAL FUNCTION PANEL
Albumin: 3.4 g/dL — ABNORMAL LOW (ref 3.5–5.0)
Anion gap: 11 (ref 5–15)
BUN: 26 mg/dL — ABNORMAL HIGH (ref 8–23)
CO2: 21 mmol/L — ABNORMAL LOW (ref 22–32)
Calcium: 9.5 mg/dL (ref 8.9–10.3)
Chloride: 108 mmol/L (ref 98–111)
Creatinine, Ser: 1.49 mg/dL — ABNORMAL HIGH (ref 0.44–1.00)
GFR, Estimated: 35 mL/min — ABNORMAL LOW (ref >60)
Glucose, Bld: 86 mg/dL (ref 70–99)
Phosphorus: 2.6 mg/dL (ref 2.5–4.6)
Potassium: 4.1 mmol/L (ref 3.5–5.1)
Sodium: 140 mmol/L (ref 135–145)

## 2020-05-03 LAB — URINE CULTURE: Culture: 20000 — AB

## 2020-05-03 LAB — MAGNESIUM: Magnesium: 1.8 mg/dL (ref 1.7–2.4)

## 2020-05-03 SURGERY — COLONOSCOPY WITH PROPOFOL
Anesthesia: General

## 2020-05-03 MED ORDER — LIDOCAINE HCL (PF) 2 % IJ SOLN
INTRAMUSCULAR | Status: AC
  Filled 2020-05-03: qty 5

## 2020-05-03 MED ORDER — SODIUM CHLORIDE 0.9 % IV SOLN: INTRAVENOUS | Status: DC

## 2020-05-03 MED ORDER — STERILE WATER FOR IRRIGATION IR SOLN
Status: DC | PRN
  Administered 2020-05-03: 100 mL

## 2020-05-03 MED ORDER — IPRATROPIUM-ALBUTEROL 0.5-2.5 (3) MG/3ML IN SOLN
3.0000 mL | Freq: Once | RESPIRATORY_TRACT | Status: AC
  Administered 2020-05-03: 3 mL via RESPIRATORY_TRACT

## 2020-05-03 MED ORDER — PROPOFOL 500 MG/50ML IV EMUL
INTRAVENOUS | Status: DC | PRN
  Administered 2020-05-03: 100 ug/kg/min via INTRAVENOUS

## 2020-05-03 MED ORDER — IPRATROPIUM-ALBUTEROL 0.5-2.5 (3) MG/3ML IN SOLN
RESPIRATORY_TRACT | Status: AC
  Filled 2020-05-03: qty 3

## 2020-05-03 MED ORDER — LACTATED RINGERS IV SOLN: INTRAVENOUS | Status: DC

## 2020-05-03 MED ORDER — PROPOFOL 10 MG/ML IV BOLUS
INTRAVENOUS | Status: AC
  Filled 2020-05-03: qty 60

## 2020-05-03 MED ORDER — PROPOFOL 10 MG/ML IV BOLUS
INTRAVENOUS | Status: DC | PRN
  Administered 2020-05-03: 30 mg via INTRAVENOUS
  Administered 2020-05-03: 100 mg via INTRAVENOUS
  Administered 2020-05-03: 50 mg via INTRAVENOUS

## 2020-05-03 MED ORDER — LIDOCAINE HCL (CARDIAC) PF 50 MG/5ML IV SOSY
PREFILLED_SYRINGE | INTRAVENOUS | Status: DC | PRN
  Administered 2020-05-03: 20 mg via INTRAVENOUS

## 2020-05-03 NOTE — Progress Notes (Signed)
Patient Demographics:    Robin [name], is a 80 y.o. female, DOB - Oct 13, 1940, IOM:[id number]  Admit date - 05/01/2020   Admitting Physician Courage Denton Brick, MD  Outpatient Primary MD for the patient is [name], MD  LOS - 1   Chief Complaint  Patient presents with  . Chest Pain        Subjective:    [name] today has no fevers, no emesis,  No chest pain,  -Tolerated colonoscopy well today,  -Plan is for capsule endoscopy on 05/04/20 Daughter at bedside   Assessment  & Plan :    Principal Problem:   Acute GI bleeding Active Problems:   Acute DVT (deep venous thrombosis) --LtUE---03/2020   Malignant neoplasm of left breast in female, estrogen receptor positive ([place])   Acute on chronic anemia Due to Acute Blood Loss/Gi Bleed   Secondary cardiomyopathy ([place])   Hypothyroidism   Essential hypertension, benign  Brief Summary:- 80 y.o. female with past medical history ofHTN, HLD, PAD-carotid artery stenosis status post endarterectomy, CAD,HFpEF/chronic grade 1 diastolic dysfunction,h/oCVA, h/o Lt UE DVT, hypothyroidism, history of Recurrent breast cancer --patient had right breast cancer in 1987 and left breast cancer in 2007 both treated with mastectomies without chemo or radiation--in remissionsince 2014, now with recurrence of breast cancer in 2022 with metastasis presents to the ED with dizziness , chest discomfort and dyspnea on exertion in the setting of loose stools, stools are heme positive in the ED -Patient has been  on Eliquis for left upper extremity DVT -Hemoglobin was 10.3 couple weeks ago  A/p 1)Acute GI bleed--- leading to acute on chronic symptomatic anemia -Patient with heme positive stools, dyspnea on exertion dizziness-while on Eliquis -Hemoglobin this admission is up to 8.7 from 7.2 after 1 unit of PRBCs  (was 10.3 couple weeks ago)  -Serum iron is 84, ferritin  is 34, check B12 and folate -Hold Eliquis -GI consult appreciated -EGD on 05/02/2020 without significant findings to explain blood loss colonoscopy on 05/03/2020 w/o significant source of bleeding -Transfused 1 unit of PRBC on 05/02/2020 -Plan is for capsule endoscopy on 05/04/20  2)Lt UE DVT- dxed 03/2020----with venous Dopplers at that time showing occlusive thrombus involving the left internal jugular vein, left subclavian vein, left axillary vein, left brachial vein and left basilic vein -Hold Eliquis due to #1 above -Anticipate lifelong anticoagulation given underlying malignancy  3)Recurrentstage IV left-sided breast cancer with bony metastasis -----pt sees oncologist Dr. Delton Coombes S/pbiopsy on 04/05/2020,pathology is consistent with adenocarcinoma -Awaiting outpatient PET scan -history of Recurrent breast cancer --patient had right breast cancer in 1987 and left breast cancer in 2007 both treated with mastectomies without chemo or radiation--in remissionsince 2014, now with recurrence of breast cancer in 2022 with metastasis ---Patientused to follow with Dr. Carl Best from [facility],patient now sees Dr.Katragadda  3)HTN---Hold amlodipine and Losartan/ HCTZ, c/n  Coreg ,  hydralazine 50 mg3 times daily and isosorbide  4)H/o CAD/PAD--status post prior carotid endarterectomy-- aspirin and Eliquis on hold- Continue  Crestor as well as Coreg  5)Hypothyroidism--stable, continue levothyroxine 137 mcg daily  6)Social/Ethics---plan of care discussed with patient and her daughters [name] and [name]  -Patient is full code with full scope of treatment  7)hypokalemia---  replace potassium  8)HFpEF--history of  chronic diastolic dysfunction CHF EF 60 to 65% based on echo from October 2021 -Repeat echo from 05/02/2020 with EF of 60 to 65% with grade 1 diastolic dysfunction and no regional wall motion normalities  9)AKI----acute kidney injury on CKD 3A-    --Creatinine is  down  to 1.49 from 1.76, patient was previously around 1.2 -Hold losartan HCTZ, hold Lasix  , renally adjust medications, avoid nephrotoxic agents / dehydration  / hypotension  10)PAD-carotid artery stenosis status post endarterectomy/CAD--hold aspirin, continue Coreg, crestor and isosorbide  Code Status:-Code Status: Full Code  Family Communication: (patient is alert, awake and coherent)  Discussed withdaughtersBarbara and [name]  Consults :Gi Procedures:- EGD 05/02/2020 -Colonoscopy 05/03/2020  Disposition/Need for in-Hospital Stay- patient unable to be discharged at this time due to symptomatic anemia due to acute GI bleeding requiring transfusion of PRBC in a patient with recent DVT requiring for anticoagulation-- --Plan is for capsule endoscopy on 05/04/20 -- Dispo: The patient is from: Home  Anticipated d/c is to: Home  Anticipated d/c date is: 1 days  Patient currently is not medically stable to d/c. Barriers: Not Clinically Stable-   DVT Prophylaxis  :   - SCDs   SCDs Start: 05/01/20 1845 Place TED hose Start: 05/01/20 1845   Lab Results  Component Value Date   PLT 152 05/03/2020    Inpatient Medications  Scheduled Meds: . carvedilol  12.5 mg Oral BID WC  . cholecalciferol  5,000 Units Oral Daily  . gabapentin  100 mg Oral TID  . hydrALAZINE  25 mg Oral TID  . isosorbide mononitrate  30 mg Oral Daily  . levothyroxine  137 mcg Oral QAC breakfast  . omega-3 acid ethyl esters  1 capsule Oral Daily  . pantoprazole (PROTONIX) IV  40 mg Intravenous Q12H  . rosuvastatin  40 mg Oral Daily  . senna-docusate  2 tablet Oral QHS  . sodium chloride flush  3 mL Intravenous Q12H  . sodium chloride flush  3 mL Intravenous Q12H   Continuous Infusions: . sodium chloride     PRN Meds:.sodium chloride, acetaminophen **OR** acetaminophen, bisacodyl, HYDROcodone-acetaminophen, labetalol, ondansetron **OR** ondansetron  (ZOFRAN) IV, polyethylene glycol, sodium chloride flush, traZODone   Anti-infectives (From admission, onward)   None        Objective:   Vitals:   05/03/20 1145 05/03/20 1219 05/03/20 1412 05/03/20 1626  BP: (!) 154/68 (!) 154/90 (!) 162/60 (!) 147/54  Pulse: 66 67 70 60  Resp: 18 18    Temp:      TempSrc:      SpO2: 98% 100%    Weight:      Height:        Wt Readings from Last 3 Encounters:  05/01/20 101.6 kg  04/12/20 101.9 kg  04/04/20 92.5 kg    Intake/Output Summary (Last 24 hours) at 05/03/2020 1832 Last data filed at 05/03/2020 1615 Gross per 24 hour  Intake 2883.95 ml  Output 12 ml  Net 2871.95 ml    Physical Exam  Gen:- Awake Alert,  Obese, in no apparent distress  HEENT:- [place].AT, No sclera icterus Neck-Supple Neck,No JVD,.  Lungs-  CTAB , fair symmetrical air movement -Breast-scars from prior bilateral mastectomy CV- S1, S2 normal, regular  Abd-  +ve B.Sounds, Abd Soft, No tenderness,    Extremity/Skin:- No  edema, pedal pulses present  Psych-affect is appropriate, oriented x3 Neuro-no new focal deficits, no tremors   Data Review:   Micro Results Recent Results (from the past 240 hour(s))  SARS CORONAVIRUS 2 (TAT 6-24 HRS) Nasopharyngeal Nasopharyngeal Swab     Status: None   Collection Time: 05/01/20  4:20 PM   Specimen: Nasopharyngeal Swab  Result Value Ref Range Status   SARS Coronavirus 2 NEGATIVE NEGATIVE Final    Comment: (NOTE) SARS-CoV-2 target nucleic acids are NOT DETECTED.  The SARS-CoV-2 RNA is generally detectable in upper and lower respiratory specimens during the acute phase of infection. Negative results do not preclude SARS-CoV-2 infection, do not rule out co-infections with other pathogens, and should not be used as the sole basis for treatment or other patient management decisions. Negative results must be combined with clinical observations, patient history, and epidemiological information. The expected result is  Negative.  Fact Sheet for Patients: SugarRoll.be  Fact Sheet for Healthcare Providers: https://www.woods-mathews.com/  This test is not yet approved or cleared by the [place] FDA and  has been authorized for detection and/or diagnosis of SARS-CoV-2 by FDA under an Emergency Use Authorization (EUA). This EUA will remain  in effect (meaning this test can be used) for the duration of the COVID-19 declaration under Se ction 564(b)(1) of the Act, 21 U.S.C. section 360bbb-3(b)(1), unless the authorization is terminated or revoked sooner.  Performed at [facility], [place] [address] [place], [place] [address]   Culture, Urine     Status: Abnormal   Collection Time: 05/01/20  4:22 PM   Specimen: Urine, Clean Catch  Result Value Ref Range Status   Specimen Description   Final    URINE, CLEAN CATCH Performed at [facility], [address] [place], [place] [address]    Special Requests   Final    NONE Performed at [facility], [address] [place], [place] [address]    Culture (A)  Final    20,000 COLONIES/mL MULTIPLE SPECIES PRESENT, SUGGEST RECOLLECTION   Report Status 05/03/2020 FINAL  Final    Radiology Reports CT Angio Chest PE W and/or Wo Contrast  Result Date: 04/04/2020 CLINICAL DATA:  80 year old female with shortness of breath for the past 2 weeks, worsening over the past 2-3 days. Evaluate for pulmonary embolism. History of breast cancer status post bilateral mastectomy in 2007. EXAM: CT ANGIOGRAPHY CHEST WITH CONTRAST TECHNIQUE: Multidetector CT imaging of the chest was performed using the standard protocol during bolus administration of intravenous contrast. Multiplanar CT image reconstructions and MIPs were obtained to evaluate the vascular anatomy. CONTRAST:  5m OMNIPAQUE IOHEXOL 350 MG/ML SOLN COMPARISON:  No priors. FINDINGS: Comment: Today's study is limited by suboptimal contrast bolus and extends patient  respiratory motion. Cardiovascular: Within the limitations of today's examination, there is no central, lobar or proximal segmental sized pulmonary embolism. Distal segmental and subsegmental sized pulmonary embolism cannot be entirely excluded on the basis of today's limited examination. Heart size is mildly enlarged. There is no significant pericardial fluid, thickening or pericardial calcification. There is aortic atherosclerosis, as well as atherosclerosis of the great vessels of the mediastinum and the coronary arteries, including calcified atherosclerotic plaque in the left main, left anterior descending, left circumflex and right coronary arteries. Mediastinum/Nodes: No pathologically enlarged mediastinal or hilar lymph nodes. Esophagus is unremarkable in appearance. No axillary lymphadenopathy. Lungs/Pleura: Patchy areas of mild ground-glass attenuation and interlobular septal thickening are noted throughout the lungs bilaterally. No confluent consolidative airspace disease. No pleural effusions. No definite suspicious appearing pulmonary nodules or masses are noted. Upper Abdomen: Aortic atherosclerosis. Musculoskeletal: In the left chest wall there is an area of soft tissue architectural distortion measuring  4.5 x 3.1 cm (axial image 68 of series 4), concerning for tumor recurrence in this patient status post bilateral modified radical mastectomy for breast cancer. Multiple lytic lesions are noted in the visualized skeleton, most evident in the T1 and T3 vertebral bodies, as well as the manubrium, concerning for potential metastatic lesions. Review of the MIP images confirms the above findings. IMPRESSION: 1. Limited study demonstrating no central, lobar, or proximal segmental sized pulmonary embolism. Smaller distal segmental and subsegmental sized emboli cannot be entirely excluded. 2. Mild cardiomegaly with evidence of mild interstitial pulmonary edema in the lungs; imaging findings suggestive of  congestive heart failure. 3. Irregular masslike area in the left chest wall concerning for potential recurrent breast neoplasm measuring approximately 4.5 x 3.1 cm. There also lytic lesions in T1 and T3 vertebral bodies as well as the manubrium. The possibility of metastatic disease should be considered. Further evaluation with PET-CT should be considered. 4. Aortic atherosclerosis, in addition to left main and 3 vessel coronary artery disease. Aortic Atherosclerosis (ICD10-I70.0). Electronically Signed   By: Vinnie Langton M.D.   On: 04/04/2020 17:47   US Venous Img Upper Left (DVT Study)  Addendum Date: 04/04/2020   ADDENDUM REPORT: 04/04/2020 14:45 ADDENDUM: These results were called by telephone at the time of interpretation on 04/04/2020 at 2:18 pm to provider GARRETT GREEN , who verbally acknowledged these results. Electronically Signed   By: Markus Daft M.D.   On: 04/04/2020 14:45   Result Date: 04/04/2020 CLINICAL DATA:  80 year old with left arm swelling. History of breast cancer. EXAM: LEFT UPPER EXTREMITY VENOUS DOPPLER ULTRASOUND TECHNIQUE: Gray-scale sonography with graded compression, as well as color Doppler and duplex ultrasound were performed to evaluate the upper extremity deep venous system from the level of the subclavian vein and including the jugular, axillary, basilic, radial, ulnar and upper cephalic vein. Spectral Doppler was utilized to evaluate flow at rest and with distal augmentation maneuvers. COMPARISON:  None. FINDINGS: Contralateral Subclavian Vein:  Patent with color Doppler flow. Internal Jugular Vein: Positive for thrombus. Echogenic thrombus in the left internal jugular vein. This appears to represent occlusive thrombus. Subclavian Vein: Positive for thrombus. Evidence for echogenic occlusive thrombus in the left subclavian vein. Axillary Vein: Positive for thrombus. Expansion of left axillary vein with echogenic occlusive thrombus. Cephalic Vein: No evidence of thrombus.  Normal compressibility and color Doppler flow. Basilic Vein: Positive for thrombus. Left basilic vein is non compressible and no significant flow. Brachial Veins: Positive for thrombus involving at least one left brachial vein. This is compatible with occlusive thrombus. Radial Veins: No evidence of thrombus.  Normal compressibility. Ulnar Veins: No evidence of thrombus. Normal compressibility. Limited evaluation. Subcutaneous edema in this area. Other Findings:  Subcutaneous edema near the wrist. IMPRESSION: Positive for deep venous thrombosis in the left upper extremity. Occlusive thrombus involving the left internal jugular vein, left subclavian vein, left axillary vein, left brachial vein and left basilic vein. Electronically Signed: By: Markus Daft M.D. On: 04/04/2020 14:12   DG Chest Port 1 View  Result Date: 05/01/2020 CLINICAL DATA:  Upper chest pain radiating to the back with nausea. EXAM: PORTABLE CHEST 1 VIEW COMPARISON:  None. FINDINGS: Enlarged cardiac silhouette. Tortuous and portion of the calcified thoracic aorta. Mildly prominent interstitial markings with normal vasculature. No right pleural fluid seen. Mild predominantly linear density in the left lower lung zone with a small amount of pleural thickening or fluid. Thoracic spine degenerative changes. IMPRESSION: 1. Cardiomegaly and mild chronic interstitial lung  disease. 2. Mild left basilar atelectasis or scarring with a small amount of pleural thickening or fluid. Electronically Signed   By: Claudie Revering M.D.   On: 05/01/2020 08:44   ECHOCARDIOGRAM COMPLETE  Result Date: 05/02/2020    ECHOCARDIOGRAM REPORT   Patient Name:   EMMERSON SHUFFIELD Date of Exam: 05/02/2020 Medical Rec #:  [id number]        Height:       66.0 in Accession #:    [id number]       Weight:       224.0 lb Date of Birth:  02/04/1940        BSA:          2.098 m Patient Age:    15 years         BP:           118/44 mmHg Patient Gender: F                HR:           57  bpm. Exam Location:  [facility] Procedure: 2D Echo Indications:    Abnormal ECG R94.31  History:        Patient has prior history of Echocardiogram examinations, most                 recent 10/27/2019. CAD; Risk Factors:Non-Smoker, Dyslipidemia and                 Hypertension.  Sonographer:    Leavy Cella RDCS (AE) Referring Phys: [id number] [place] IMPRESSIONS  1. Left ventricular ejection fraction, by estimation, is 60 to 65%. The left ventricle has normal function. The left ventricle has no regional wall motion abnormalities. Left ventricular diastolic parameters are consistent with Grade I diastolic dysfunction (impaired relaxation).  2. Right ventricular systolic function is normal. The right ventricular size is normal.  3. Left atrial size was mildly dilated.  4. The mitral valve is abnormal. No evidence of mitral valve regurgitation. No evidence of mitral stenosis. Moderate mitral annular calcification.  5. The aortic valve is tricuspid. There is mild calcification of the aortic valve. Aortic valve regurgitation is not visualized. Mild to moderate aortic valve sclerosis/calcification is present, without any evidence of aortic stenosis.  6. The inferior vena cava is normal in size with greater than 50% respiratory variability, suggesting right atrial pressure of 3 mmHg. FINDINGS  Left Ventricle: Left ventricular ejection fraction, by estimation, is 60 to 65%. The left ventricle has normal function. The left ventricle has no regional wall motion abnormalities. The left ventricular internal cavity size was normal in size. There is  no left ventricular hypertrophy. Left ventricular diastolic parameters are consistent with Grade I diastolic dysfunction (impaired relaxation). Right Ventricle: The right ventricular size is normal. No increase in right ventricular wall thickness. Right ventricular systolic function is normal. Left Atrium: Left atrial size was mildly dilated. Right Atrium: Right atrial size  was normal in size. Pericardium: There is no evidence of pericardial effusion. Mitral Valve: The mitral valve is abnormal. There is mild thickening of the mitral valve leaflet(s). There is mild calcification of the mitral valve leaflet(s). Moderate mitral annular calcification. No evidence of mitral valve regurgitation. No evidence  of mitral valve stenosis. Tricuspid Valve: The tricuspid valve is normal in structure. Tricuspid valve regurgitation is trivial. No evidence of tricuspid stenosis. Aortic Valve: The aortic valve is tricuspid. There is mild calcification of the aortic valve. Aortic valve regurgitation is not visualized.  Mild to moderate aortic valve sclerosis/calcification is present, without any evidence of aortic stenosis. Pulmonic Valve: The pulmonic valve was normal in structure. Pulmonic valve regurgitation is not visualized. No evidence of pulmonic stenosis. Aorta: The aortic root is normal in size and structure. Venous: The inferior vena cava is normal in size with greater than 50% respiratory variability, suggesting right atrial pressure of 3 mmHg. IAS/Shunts: No atrial level shunt detected by color flow Doppler.  LEFT VENTRICLE PLAX 2D LVIDd:         3.70 cm  Diastology LVIDs:         1.60 cm  LV e' medial:    6.42 cm/s LV PW:         1.08 cm  LV E/e' medial:  10.5 LV IVS:        1.09 cm  LV e' lateral:   5.44 cm/s LVOT diam:     2.00 cm  LV E/e' lateral: 12.4 LVOT Area:     3.14 cm  RIGHT VENTRICLE RV S prime:     14.80 cm/s TAPSE (M-mode): 2.4 cm LEFT ATRIUM             Index       RIGHT ATRIUM           Index LA diam:        3.80 cm 1.81 cm/m  RA Area:     12.40 cm LA Vol (A2C):   29.7 ml 14.15 ml/m RA Volume:   31.50 ml  15.01 ml/m LA Vol (A4C):   26.5 ml 12.63 ml/m LA Biplane Vol: 28.7 ml 13.68 ml/m   AORTA Ao Root diam: 2.40 cm MITRAL VALVE                TRICUSPID VALVE MV Area (PHT): 2.08 cm     TR Peak grad:   12.8 mmHg MV Decel Time: 365 msec     TR Vmax:        179.00 cm/s MV E  velocity: 67.70 cm/s MV A velocity: 125.00 cm/s  SHUNTS MV E/A ratio:  0.54         Systemic Diam: 2.00 cm Jenkins Rouge MD Electronically signed by Jenkins Rouge MD Signature Date/Time: 05/02/2020/12:29:36 PM    Final    [place] CORE BIOPSY (SOFT TISSUE)  Result Date: 04/05/2020 CLINICAL DATA:  LEFT chest wall mass by CT. Scarring at LEFT chest wall secondary to prior LEFT breast cancer post BILATERAL mastectomy and LEFT axillary node dissection. EXAM: US GUIDED CORE BIOPSY OF LEFT CHEST WALL MASS COMPARISON:  Ultrasound LEFT chest wall 04/05/2020, CT angio chest 04/04/2020 PROCEDURE: I met with the patient and we discussed the procedure of ultrasound-guided biopsy, including benefits and alternatives. We discussed the high likelihood of a successful procedure. We discussed the risks of the procedure, including infection and bleeding. Informed written consent was given. The usual time-out protocol was performed immediately prior to the procedure. Using sterile technique and 1% Lidocaine as local anesthetic, under direct ultrasound visualization, 18 gauge and 14 gauge spring-loaded devices were used to perform biopsy of LEFT chest wall mass using a inferior to superior approach. Procedure tolerated well by patient without immediate complication. IMPRESSION: Ultrasound guided core biopsies of LEFT chest wall mass. No immediate complication. Electronically Signed   By: Lavonia Dana M.D.   On: 04/05/2020 14:50   [place] CHEST SOFT TISSUE  Result Date: 04/05/2020 CLINICAL DATA:  Abnormal CT chest, LEFT chest wall soft tissue mass, past history of BILATERAL  mastectomy, LEFT breast cancer and axillary node dissection EXAM: ULTRASOUND OF CHEST SOFT TISSUES TECHNIQUE: Ultrasound examination of the chest wall soft tissues was performed in the area of clinical concern. COMPARISON:  CT angio chest 04/04/2020 FINDINGS: At the site of clinical concern, and irregular firm hypoechoic deep subcutaneous soft tissue nodule is identified  which measures approximately 4.0 x 1.5 x 3.3 cm in size. Abnormality corresponds to the irregular soft tissue mass seen on CT and is concerning for chest wall recurrence of tumor. Mass appears hypovascular. No definite fluid collections or calcifications. IMPRESSION: 4.0 cm diameter deep subcutaneous soft tissue mass of the LEFT chest wall corresponding to CT abnormality, question tumor recurrence of the chest wall. Lesion is amenable to percutaneous ultrasound-guided core biopsy. Electronically Signed   By: Lavonia Dana M.D.   On: 04/05/2020 12:17     CBC Recent Labs  Lab 05/01/20 0824 05/02/20 0618 05/02/20 1415 05/03/20 0502  WBC 8.4 7.1  --  8.3  HGB 8.8* 7.2* 8.8* 8.7*  HCT 27.7* 23.8* 28.0* 28.2*  PLT 205 180  --  152  MCV 83.9 86.2  --  84.4  MCH 26.7 26.1  --  26.0  MCHC 31.8 30.3  --  30.9  RDW 16.7* 16.8*  --  16.7*  LYMPHSABS 1.2  --   --   --   MONOABS 0.7  --   --   --   EOSABS 0.2  --   --   --   BASOSABS 0.0  --   --   --     Chemistries  Recent Labs  Lab 05/01/20 0824 05/02/20 0618 05/03/20 0502  NA 139 138 140  K 3.4* 3.3* 4.1  CL 102 103 108  CO2 24 24 21*  GLUCOSE 119* 105* 86  BUN 42* 34* 26*  CREATININE 1.76* 1.56* 1.49*  CALCIUM 9.9 9.5 9.5  MG  --   --  1.8  AST 26  --   --   ALT 14  --   --   ALKPHOS 128*  --   --   BILITOT 0.6  --   --    ------------------------------------------------------------------------------------------------------------------ No results for input(s): CHOL, HDL, LDLCALC, TRIG, CHOLHDL, LDLDIRECT in the last 72 hours.  Lab Results  Component Value Date   HGBA1C 5.9 (H) 03/01/2020   ------------------------------------------------------------------------------------------------------------------ No results for input(s): TSH, T4TOTAL, T3FREE, THYROIDAB in the last 72 hours.  Invalid input(s):  FREET3 ------------------------------------------------------------------------------------------------------------------ Recent Labs    05/01/20 0824  FERRITIN 34  TIBC 512*  IRON 84    Coagulation profile No results for input(s): INR, PROTIME in the last 168 hours.  No results for input(s): DDIMER in the last 72 hours.  Cardiac Enzymes No results for input(s): CKMB, TROPONINI, MYOGLOBIN in the last 168 hours.  Invalid input(s): CK ------------------------------------------------------------------------------------------------------------------    Component Value Date/Time   BNP 92.0 04/04/2020 1339    Roxan Hockey M.D on 05/03/2020 at 6:32 PM  Go to [email or web address] - for contact info  [facility] - Office  [phone]

## 2020-05-03 NOTE — Progress Notes (Signed)
Blood transfusion completed prior to endoscopic procedure. On arrival to PACU Dr. Wyatt Haste states patient to have post transfusion lasix after she gets back to floor ( per order prior to procedure), her BP is still somewhat soft post procedure. This communicated to [place] on 300 in SBAR hand off.

## 2020-05-03 NOTE — Op Note (Addendum)
[facility] Patient Name: Robin [name] Procedure Date: 05/03/2020 8:21 AM MRN: [id number] Date of Birth: 1940-04-04 Attending MD: Norvel Richards , MD CSN: [id number] Age: 80 Admit Type: Inpatient Procedure:                Colonoscopy Indications:              Heme positive stool Providers:                Norvel Richards, MD, Lambert Mody, Aram Candela Referring MD:              Medicines:                Propofol per Anesthesia Complications:            No immediate complications. Estimated Blood Loss:     Estimated blood loss was minimal. Procedure:                Pre-Anesthesia Assessment:                           - Prior to the procedure, a History and Physical                            was performed, and patient medications and                            allergies were reviewed. The patient's tolerance of                            previous anesthesia was also reviewed. The risks                            and benefits of the procedure and the sedation                            options and risks were discussed with the patient.                            All questions were answered, and informed consent                            was obtained. Prior Anticoagulants: The patient has                            taken no previous anticoagulant or antiplatelet                            agents. ASA Grade Assessment: III - A patient with                            severe systemic disease. After reviewing the risks  and benefits, the patient was deemed in                            satisfactory condition to undergo the procedure.                           After obtaining informed consent, the colonoscope                            was passed under direct vision. Throughout the                            procedure, the patient's blood pressure, pulse, and                            oxygen saturations were  monitored continuously. The                            CF-HQ190L ([id number]) scope was introduced through                            the anus and advanced to the 20 cm into the ileum.                            The colonoscopy was performed without difficulty.                            The patient tolerated the procedure well. The                            colonoscopy was performed without difficulty. The                            patient tolerated the procedure well. The terminal                            ileum, ileocecal valve, appendiceal orifice, and                            rectum were photographed. The terminal ileum,                            ileocecal valve, appendiceal orifice, and rectum                            were photographed. Anatomical landmarks were                            photographed. The colonoscopy was performed without                            difficulty. The entire colon was well visualized. Scope In: 10:14:30 AM Scope Out: 10:47:28 AM Scope Withdrawal Time: 0 hours 29 minutes 32 seconds  Total Procedure Duration: 0 hours  32 minutes 58 seconds  Findings:      The perianal and digital rectal examinations were normal.      Multiple medium-mouthed diverticula were found in the entire colon.       Distal 20 cm TI appeared normal.      Five polyps were found in the ascending colon. The polyps were 6 to 8 mm       in size. These polyps were removed with a cold snare. Resection and       retrieval were complete. Estimated blood loss was minimal.      Non-bleeding internal hemorrhoids were found during retroflexion. The       hemorrhoids were moderate, large and Grade I (internal hemorrhoids that       do not prolapse).      The exam was otherwise without abnormality on direct and retroflexion       views. Impression:               - Diverticulosis in the entire examined colon. No                            blood in loweGI tract or TI                            - Five 6 to 8 mm polyps in the ascending colon,                            removed with a cold snare. Resected and retrieved.                           - Non-bleeding internal hemorrhoids. Normal TI                           - The examination was otherwise normal on direct                            and retroflexion views. Moderate Sedation:      Moderate (conscious) sedation was personally administered by an       anesthesia professional. The following parameters were monitored: oxygen       saturation, heart rate, blood pressure, respiratory rate, EKG, adequacy       of pulmonary ventilation, and response to care. Recommendation:           - Return patient to hospital ward for ongoing care.                           - Clear liquid diet.                           - Continue present medications.                           - Repeat colonoscopy date to be determined after                            pending pathology results are reviewed for  surveillance based on pathology results.                           - Return to GI office (date not yet determined).                            Capsule Study of small bowel tomorrow. Discussed                            this approach with pt earlier today Procedure Code(s):        --- Professional ---                           [id number], Colonoscopy, flexible; with removal of                            tumor(s), polyp(s), or other lesion(s) by snare                            technique Diagnosis Code(s):        --- Professional ---                           K64.0, First degree hemorrhoids                           K63.5, Polyp of colon                           R19.5, Other fecal abnormalities                           K57.30, Diverticulosis of large intestine without                            perforation or abscess without bleeding CPT copyright 2019 [employer]. All rights reserved. The codes documented in this  report are preliminary and upon coder review may  be revised to meet current compliance requirements. Cristopher Estimable. Anoop Hemmer, MD Norvel Richards, MD 05/03/2020 11:07:36 AM This report has been signed electronically. Number of Addenda: 0

## 2020-05-03 NOTE — Progress Notes (Signed)
Pt back to room via stretcher from endo s/p colonoscopy. VSS.

## 2020-05-03 NOTE — Transfer of Care (Signed)
Immediate Anesthesia Transfer of Care Note  Patient: Robin [name]  Procedure(s) Performed: COLONOSCOPY WITH PROPOFOL (N/A ) POLYPECTOMY  Patient Location: PACU  Anesthesia Type:General  Level of Consciousness: awake, alert  and patient cooperative  Airway & Oxygen Therapy: Patient Spontanous Breathing  Post-op Assessment: Report given to RN and Post -op Vital signs reviewed and stable  Post vital signs: Reviewed and stable  Last Vitals:  Vitals Value Taken Time  BP 142/79 05/03/20 1053  Temp 98.2 axillary   Pulse 83 05/03/20 1055  Resp 18 05/03/20 1055  SpO2 97 % 05/03/20 1055  Vitals shown include unvalidated device data.  Last Pain:  Vitals:   05/03/20 1011  TempSrc:   PainSc: 0-No pain      Patients Stated Pain Goal: 9 (00/37/94 4461)  Complications: No complications documented.

## 2020-05-03 NOTE — Anesthesia Postprocedure Evaluation (Signed)
Anesthesia Post Note  Patient: Robin [name]  Procedure(s) Performed: COLONOSCOPY WITH PROPOFOL (N/A ) POLYPECTOMY  Patient location during evaluation: PACU Anesthesia Type: General Level of consciousness: awake and alert and oriented Pain management: pain level controlled Vital Signs Assessment: post-procedure vital signs reviewed and stable Respiratory status: spontaneous breathing and respiratory function stable (slight wheezing after colonoscopy, duoneb nebulizer treatment was given, lungs clear after the treatment) Cardiovascular status: stable Postop Assessment: no apparent nausea or vomiting Anesthetic complications: no   No complications documented.   Last Vitals:  Vitals:   05/03/20 1145 05/03/20 1219  BP: (!) 154/68 (!) 154/90  Pulse: 66 67  Resp: 18 18  Temp:    SpO2: 98% 100%    Last Pain:  Vitals:   05/03/20 1219  TempSrc:   PainSc: 0-No pain                 Alexa Golebiewski C Yariah Selvey

## 2020-05-03 NOTE — Progress Notes (Signed)
Pt received 1032ml of tap water enema this am, able to retain for 15 minutes then up to [facility]. Clear results. Pt tolerated well. This info was reported to Endo nurse Burr Medico, RN. Pt then transported down to endoscopy via stretcher for planned colonoscopy.

## 2020-05-03 NOTE — Anesthesia Procedure Notes (Signed)
Date/Time: 05/03/2020 10:08 AM Performed by: Vista Deck, CRNA Pre-anesthesia Checklist: Patient identified, Emergency Drugs available, Suction available, Timeout performed and Patient being monitored Patient Re-evaluated:Patient Re-evaluated prior to induction Oxygen Delivery Method: Nasal Cannula

## 2020-05-03 NOTE — Anesthesia Preprocedure Evaluation (Addendum)
Anesthesia Evaluation  Patient identified by MRN, date of birth, ID band Patient awake    Reviewed: Allergy & Precautions, NPO status , Patient's Chart, lab work & pertinent test results  Airway Mallampati: II  TM Distance: >3 FB Neck ROM: Full    Dental  (+) Missing, Dental Advisory Given, Upper Dentures   Pulmonary neg pulmonary ROS,    Pulmonary exam normal breath sounds clear to auscultation       Cardiovascular Exercise Tolerance: Poor hypertension, Pt. on medications + CAD, + Past MI, + Cardiac Stents, + Peripheral Vascular Disease (carotid endarterectomy ) and + DVT  Normal cardiovascular exam Rhythm:Regular Rate:Normal  1. Left ventricular ejection fraction, by estimation, is 60 to 65%. The left ventricle has normal function. The left ventricle has no regional wall motion abnormalities. There is mild left ventricular hypertrophy. Left ventricular diastolic parameters are consistent with Grade I diastolic dysfunction (impaired relaxation). Elevated left atrial pressure.  2. Right ventricular systolic function is normal. The right ventricular size is normal.  3. Left atrial size was mildly dilated.  4. The mitral valve is normal in structure. Trivial mitral valve  regurgitation. No evidence of mitral stenosis.  5. The aortic valve is tricuspid. There is mild calcification of the  aortic valve. There is mild thickening of the aortic valve. Aortic valve regurgitation is not visualized. No aortic stenosis is present.  6. The inferior vena cava is normal in size with greater than 50% respiratory variability, suggesting right atrial pressure of 3 mmHg.    Neuro/Psych  Neuromuscular disease CVA, Residual Symptoms    GI/Hepatic Neg liver ROS, Acute GI bleeding   Endo/Other  diabetes, Well Controlled, Type 2Hypothyroidism   Renal/GU negative Renal ROS     Musculoskeletal  (+) Arthritis , Osteoarthritis,    Abdominal    Peds  Hematology  (+) anemia ,   Anesthesia Other Findings Left Breast cancer  Reproductive/Obstetrics                            Anesthesia Physical  Anesthesia Plan  ASA: III  Anesthesia Plan: General   Post-op Pain Management:    Induction: Intravenous  PONV Risk Score and Plan: Propofol infusion  Airway Management Planned: Nasal Cannula and Natural Airway  Additional Equipment:   Intra-op Plan:   Post-operative Plan: Possible Post-op intubation/ventilation  Informed Consent: I have reviewed the patients History and Physical, chart, labs and discussed the procedure including the risks, benefits and alternatives for the proposed anesthesia with the patient or authorized representative who has indicated his/her understanding and acceptance.     Dental advisory given  Plan Discussed with: CRNA and Surgeon  Anesthesia Plan Comments:         Anesthesia Quick Evaluation

## 2020-05-03 NOTE — Progress Notes (Signed)
Patient briefly seen. VSS. No complaints this morning. She completed her bowel prep. Last BM watery, clear per nursing. Plans for colonoscopy today to evaluate recent onset anemia, trend toward IDA, recent melena (unremarkable EGD), heme + stool.   [name] Bernarda Caffey [facility] [phone] 4/14/20228:32 AM

## 2020-05-04 ENCOUNTER — Encounter (HOSPITAL_COMMUNITY)
Admission: EM | Disposition: A | Discharge status: Home / Self Care | Payer: Self-pay | Source: Home / Self Care | Attending: Family Medicine

## 2020-05-04 ENCOUNTER — Encounter (HOSPITAL_COMMUNITY): Payer: Self-pay | Admitting: Family Medicine

## 2020-05-04 DIAGNOSIS — K922 Gastrointestinal hemorrhage, unspecified: Secondary | ICD-10-CM | POA: Diagnosis not present

## 2020-05-04 HISTORY — PX: GIVENS CAPSULE STUDY: SHX5432

## 2020-05-04 LAB — GLUCOSE, CAPILLARY
Glucose-Capillary: 99 mg/dL (ref 70–99)
Glucose-Capillary: 103 mg/dL — ABNORMAL HIGH (ref 70–99)
Glucose-Capillary: 93 mg/dL (ref 70–99)
Glucose-Capillary: 111 mg/dL — ABNORMAL HIGH (ref 70–99)

## 2020-05-04 LAB — BASIC METABOLIC PANEL
Anion gap: 10 (ref 5–15)
BUN: 19 mg/dL (ref 8–23)
CO2: 22 mmol/L (ref 22–32)
Calcium: 9.3 mg/dL (ref 8.9–10.3)
Chloride: 107 mmol/L (ref 98–111)
Creatinine, Ser: 1.49 mg/dL — ABNORMAL HIGH (ref 0.44–1.00)
GFR, Estimated: 35 mL/min — ABNORMAL LOW (ref >60)
Glucose, Bld: 100 mg/dL — ABNORMAL HIGH (ref 70–99)
Potassium: 3.4 mmol/L — ABNORMAL LOW (ref 3.5–5.1)
Sodium: 139 mmol/L (ref 135–145)

## 2020-05-04 LAB — SURGICAL PATHOLOGY

## 2020-05-04 LAB — CBC
HCT: 27.1 % — ABNORMAL LOW (ref 36.0–46.0)
Hemoglobin: 8.3 g/dL — ABNORMAL LOW (ref 12.0–15.0)
MCH: 26.1 pg (ref 26.0–34.0)
MCHC: 30.6 g/dL (ref 30.0–36.0)
MCV: 85.2 fL (ref 80.0–100.0)
Platelets: 161 10*3/uL (ref 150–400)
RBC: 3.18 MIL/uL — ABNORMAL LOW (ref 3.87–5.11)
RDW: 16.8 % — ABNORMAL HIGH (ref 11.5–15.5)
WBC: 6.9 10*3/uL (ref 4.0–10.5)
nRBC: 0 % (ref 0.0–0.2)

## 2020-05-04 SURGERY — IMAGING PROCEDURE, GI TRACT, INTRALUMINAL, VIA CAPSULE

## 2020-05-04 MED ORDER — POTASSIUM CHLORIDE CRYS ER 20 MEQ PO TBCR
40.0000 meq | EXTENDED_RELEASE_TABLET | ORAL | Status: AC
  Administered 2020-05-04 (×2): 40 meq via ORAL
  Filled 2020-05-04 (×2): qty 2

## 2020-05-04 MED ORDER — PANTOPRAZOLE SODIUM 40 MG PO TBEC
40.0000 mg | DELAYED_RELEASE_TABLET | Freq: Every day | ORAL | Status: DC
  Administered 2020-05-05: 40 mg via ORAL
  Filled 2020-05-04: qty 1

## 2020-05-04 NOTE — Progress Notes (Signed)
Synthroid held this morning due to NPO order

## 2020-05-04 NOTE — Care Management Important Message (Signed)
Important Message  Patient Details  Name: Robin [name] MRN: [id number] Date of Birth: 1940-11-03   Medicare Important Message Given:  Yes     Tommy Medal 05/04/2020, 12:03 PM

## 2020-05-04 NOTE — OR Nursing (Signed)
AC notified of removal time (1829 on April 15/2022) for [place] [employer] as well as attending nurse Theron Arista, RN.

## 2020-05-04 NOTE — Progress Notes (Signed)
Patient Demographics:    Robin [name], is a 80 y.o. female, DOB - 09/23/1940, KAJ:[id number]  Admit date - 05/01/2020   Admitting Physician Lamia Mariner Denton Brick, MD  Outpatient Primary MD for the patient is [name], MD  LOS - 2   Chief Complaint  Patient presents with  . Chest Pain        Subjective:    Robin [name] today has no fevers, no emesis,  No chest pain,  -Tolerated colonoscopy well today, S/p  capsule endoscopy on 05/04/20 Daughter and grand daughter at bedside No BM today   Assessment  & Plan :    Principal Problem:   Acute GI bleeding Active Problems:   Acute DVT (deep venous thrombosis) --LtUE---03/2020   Malignant neoplasm of left breast in female, estrogen receptor positive ([place])   Acute on chronic anemia Due to Acute Blood Loss/Gi Bleed   Secondary cardiomyopathy ([place])   Hypothyroidism   Essential hypertension, benign  Brief Summary:- 80 y.o. female with past medical history ofHTN, HLD, PAD-carotid artery stenosis status post endarterectomy, CAD,HFpEF/chronic grade 1 diastolic dysfunction,h/oCVA, h/o Lt UE DVT, hypothyroidism, history of Recurrent breast cancer --patient had right breast cancer in 1987 and left breast cancer in 2007 both treated with mastectomies without chemo or radiation--in remissionsince 2014, now with recurrence of breast cancer in 2022 with metastasis presents to the ED with dizziness , chest discomfort and dyspnea on exertion in the setting of loose stools, stools are heme positive in the ED -Patient has been  on Eliquis for left upper extremity DVT -Hemoglobin was 10.3 couple weeks ago  A/p 1)Acute GI bleed--- leading to acute on chronic symptomatic anemia -Patient with heme positive stools, dyspnea on exertion dizziness-while on Eliquis -Hemoglobin this admission is up to 8.3 from 7.2 after 1 unit of PRBCs  (was 10.3 couple weeks ago)   -Serum iron is 84, ferritin is 34, check B12 and folate -Hold Eliquis -GI consult appreciated -EGD on 05/02/2020 without significant findings to explain blood loss colonoscopy on 05/03/2020 w/o significant source of bleeding -Transfused 1 unit of PRBC on 05/02/2020 S/p capsule endoscopy on 05/04/20 -Ok to Restart Eliquis on 05/05/20 if capsule endoscopy without Evidence of Bleeding  2)Lt UE DVT- dxed 03/2020----with venous Dopplers at that time showing occlusive thrombus involving the left internal jugular vein, left subclavian vein, left axillary vein, left brachial vein and left basilic vein -Hold Eliquis due to #1 above --Ok to [employer] on 05/05/20 if capsule endoscopy without Evidence of Bleeding -Anticipate lifelong anticoagulation given underlying malignancy  3)Recurrentstage IV left-sided breast cancer with bony metastasis -----pt sees oncologist Dr. Delton Coombes S/pbiopsy on 04/05/2020,pathology is consistent with adenocarcinoma -Awaiting outpatient PET scan -history of Recurrent breast cancer --patient had right breast cancer in 1987 and left breast cancer in 2007 both treated with mastectomies without chemo or radiation--in remissionsince 2014, now with recurrence of breast cancer in 2022 with metastasis ---Patientused to follow with Dr. Carl Best from [facility],patient now sees Dr.Katragadda  3)HTN---Hold amlodipine and Losartan/ HCTZ, c/n  Coreg ,  hydralazine 50 mg3 times daily and isosorbide  4)H/o CAD/PAD--status post prior carotid endarterectomy-- aspirin and Eliquis on hold- Continue  Crestor as well as Coreg  5)Hypothyroidism--stable, continue levothyroxine 137 mcg daily  6)Social/Ethics---plan of  care discussed with patient and her daughters [name] and [name]  -Patient is full code with full scope of treatment  7)Hypokalemia---  replace potassium  8)HFpEF--history of chronic diastolic dysfunction CHF EF 60 to 65% based on echo from October  2021 -Repeat echo from 05/02/2020 with EF of 60 to 65% with grade 1 diastolic dysfunction and no regional wall motion normalities  9)AKI----acute kidney injury on CKD 3A-    --Creatinine is  down to 1.49 from 1.76, patient was previously around 1.2 -Hold losartan HCTZ, hold Lasix  , renally adjust medications, avoid nephrotoxic agents / dehydration  / hypotension  10)PAD-carotid artery stenosis status post endarterectomy/CAD--hold aspirin, continue Coreg, crestor and isosorbide  Code Status:-Code Status: Full Code  Family Communication: (patient is alert, awake and coherent)  Discussed withdaughtersBarbara and [name]  Consults :Gi Procedures:- EGD 05/02/2020 -Colonoscopy 05/03/2020 -Capsule endoscopy 05/04/20 -  Disposition/Need for in-Hospital Stay- patient unable to be discharged at this time due to symptomatic anemia due to acute GI bleeding requiring transfusion of PRBC in a patient with recent DVT requiring for anticoagulation-- -Ok to Restart Eliquis on 05/05/20 if capsule endoscopy without Evidence of Bleeding -- Dispo: The patient is from: Home  Anticipated d/c is to: Home  Anticipated d/c date is: 1 days  Patient currently is not medically stable to d/c. Barriers: Not Clinically Stable-   DVT Prophylaxis  :   - SCDs   SCDs Start: 05/01/20 1845 Place TED hose Start: 05/01/20 1845   Lab Results  Component Value Date   PLT 161 05/04/2020    Inpatient Medications  Scheduled Meds: . carvedilol  12.5 mg Oral BID WC  . cholecalciferol  5,000 Units Oral Daily  . gabapentin  100 mg Oral TID  . hydrALAZINE  25 mg Oral TID  . isosorbide mononitrate  30 mg Oral Daily  . levothyroxine  137 mcg Oral QAC breakfast  . omega-3 acid ethyl esters  1 capsule Oral Daily  . [START ON 05/05/2020] pantoprazole  40 mg Oral Daily  . rosuvastatin  40 mg Oral Daily  . senna-docusate  2 tablet Oral QHS  . sodium chloride  flush  3 mL Intravenous Q12H  . sodium chloride flush  3 mL Intravenous Q12H   Continuous Infusions: . sodium chloride     PRN Meds:.sodium chloride, acetaminophen **OR** acetaminophen, bisacodyl, HYDROcodone-acetaminophen, labetalol, ondansetron **OR** ondansetron (ZOFRAN) IV, polyethylene glycol, sodium chloride flush, traZODone   Anti-infectives (From admission, onward)   None        Objective:   Vitals:   05/03/20 1626 05/03/20 2035 05/04/20 0515 05/04/20 0755  BP: (!) 147/54 (!) 131/39 (!) 138/52 (!) 174/59  Pulse: 60 64 72 75  Resp:  $Remo'18 18 16  'gLlOm$ Temp:  98.4 F (36.9 C) 98.2 F (36.8 C) 98 F (36.7 C)  TempSrc:  Oral Oral Oral  SpO2:  98% 98% 99%  Weight:      Height:        Wt Readings from Last 3 Encounters:  05/01/20 101.6 kg  04/12/20 101.9 kg  04/04/20 92.5 kg    Intake/Output Summary (Last 24 hours) at 05/04/2020 1808 Last data filed at 05/04/2020 0900 Gross per 24 hour  Intake 120 ml  Output --  Net 120 ml    Physical Exam  Gen:- Awake Alert,  Obese, in no apparent distress  HEENT:- [place].AT, No sclera icterus Neck-Supple Neck,No JVD,.  Lungs-  CTAB , fair symmetrical air movement -Breast-scars from prior bilateral mastectomy CV- S1, S2 normal,  regular  Abd-  +ve B.Sounds, Abd Soft, No tenderness, increased truncal adiposity    Extremity/Skin:- No  edema, pedal pulses present  Psych-affect is appropriate, oriented x3 Neuro-no new focal deficits, no tremors   Data Review:   Micro Results Recent Results (from the past 240 hour(s))  SARS CORONAVIRUS 2 (TAT 6-24 HRS) Nasopharyngeal Nasopharyngeal Swab     Status: None   Collection Time: 05/01/20  4:20 PM   Specimen: Nasopharyngeal Swab  Result Value Ref Range Status   SARS Coronavirus 2 NEGATIVE NEGATIVE Final    Comment: (NOTE) SARS-CoV-2 target nucleic acids are NOT DETECTED.  The SARS-CoV-2 RNA is generally detectable in upper and lower respiratory specimens during the acute phase of  infection. Negative results do not preclude SARS-CoV-2 infection, do not rule out co-infections with other pathogens, and should not be used as the sole basis for treatment or other patient management decisions. Negative results must be combined with clinical observations, patient history, and epidemiological information. The expected result is Negative.  Fact Sheet for Patients: SugarRoll.be  Fact Sheet for Healthcare Providers: https://www.woods-mathews.com/  This test is not yet approved or cleared by the [place] FDA and  has been authorized for detection and/or diagnosis of SARS-CoV-2 by FDA under an Emergency Use Authorization (EUA). This EUA will remain  in effect (meaning this test can be used) for the duration of the COVID-19 declaration under Se ction 564(b)(1) of the Act, 21 U.S.C. section 360bbb-3(b)(1), unless the authorization is terminated or revoked sooner.  Performed at [facility], [place] [address], [place], [place] [address]   Culture, Urine     Status: Abnormal   Collection Time: 05/01/20  4:22 PM   Specimen: Urine, Clean Catch  Result Value Ref Range Status   Specimen Description   Final    URINE, CLEAN CATCH Performed at [facility], [address] [place], [place] [address]    Special Requests   Final    NONE Performed at [facility], [address], [place], [place] [address]    Culture (A)  Final    20,000 COLONIES/mL MULTIPLE SPECIES PRESENT, SUGGEST RECOLLECTION   Report Status 05/03/2020 FINAL  Final    Radiology Reports DG Chest Port 1 View  Result Date: 05/01/2020 CLINICAL DATA:  Upper chest pain radiating to the back with nausea. EXAM: PORTABLE CHEST 1 VIEW COMPARISON:  None. FINDINGS: Enlarged cardiac silhouette. Tortuous and portion of the calcified thoracic aorta. Mildly prominent interstitial markings with normal vasculature. No right pleural fluid seen. Mild predominantly linear density  in the left lower lung zone with a small amount of pleural thickening or fluid. Thoracic spine degenerative changes. IMPRESSION: 1. Cardiomegaly and mild chronic interstitial lung disease. 2. Mild left basilar atelectasis or scarring with a small amount of pleural thickening or fluid. Electronically Signed   By: Claudie Revering M.D.   On: 05/01/2020 08:44   ECHOCARDIOGRAM COMPLETE  Result Date: 05/02/2020    ECHOCARDIOGRAM REPORT   Patient Name:   HARUKA KOWALESKI Date of Exam: 05/02/2020 Medical Rec #:  [id number]        Height:       66.0 in Accession #:    [id number]       Weight:       224.0 lb Date of Birth:  [birth year]        BSA:          2.098 m Patient Age:    69 years  BP:           118/44 mmHg Patient Gender: F                HR:           57 bpm. Exam Location:  [facility] Procedure: 2D Echo Indications:    Abnormal ECG R94.31  History:        Patient has prior history of Echocardiogram examinations, most                 recent 10/27/2019. CAD; Risk Factors:Non-Smoker, Dyslipidemia and                 Hypertension.  Sonographer:    Leavy Cella RDCS (AE) Referring Phys: [id number] Kiyoshi Schaab IMPRESSIONS  1. Left ventricular ejection fraction, by estimation, is 60 to 65%. The left ventricle has normal function. The left ventricle has no regional wall motion abnormalities. Left ventricular diastolic parameters are consistent with Grade I diastolic dysfunction (impaired relaxation).  2. Right ventricular systolic function is normal. The right ventricular size is normal.  3. Left atrial size was mildly dilated.  4. The mitral valve is abnormal. No evidence of mitral valve regurgitation. No evidence of mitral stenosis. Moderate mitral annular calcification.  5. The aortic valve is tricuspid. There is mild calcification of the aortic valve. Aortic valve regurgitation is not visualized. Mild to moderate aortic valve sclerosis/calcification is present, without any evidence of aortic stenosis.  6. The  inferior vena cava is normal in size with greater than 50% respiratory variability, suggesting right atrial pressure of 3 mmHg. FINDINGS  Left Ventricle: Left ventricular ejection fraction, by estimation, is 60 to 65%. The left ventricle has normal function. The left ventricle has no regional wall motion abnormalities. The left ventricular internal cavity size was normal in size. There is  no left ventricular hypertrophy. Left ventricular diastolic parameters are consistent with Grade I diastolic dysfunction (impaired relaxation). Right Ventricle: The right ventricular size is normal. No increase in right ventricular wall thickness. Right ventricular systolic function is normal. Left Atrium: Left atrial size was mildly dilated. Right Atrium: Right atrial size was normal in size. Pericardium: There is no evidence of pericardial effusion. Mitral Valve: The mitral valve is abnormal. There is mild thickening of the mitral valve leaflet(s). There is mild calcification of the mitral valve leaflet(s). Moderate mitral annular calcification. No evidence of mitral valve regurgitation. No evidence  of mitral valve stenosis. Tricuspid Valve: The tricuspid valve is normal in structure. Tricuspid valve regurgitation is trivial. No evidence of tricuspid stenosis. Aortic Valve: The aortic valve is tricuspid. There is mild calcification of the aortic valve. Aortic valve regurgitation is not visualized. Mild to moderate aortic valve sclerosis/calcification is present, without any evidence of aortic stenosis. Pulmonic Valve: The pulmonic valve was normal in structure. Pulmonic valve regurgitation is not visualized. No evidence of pulmonic stenosis. Aorta: The aortic root is normal in size and structure. Venous: The inferior vena cava is normal in size with greater than 50% respiratory variability, suggesting right atrial pressure of 3 mmHg. IAS/Shunts: No atrial level shunt detected by color flow Doppler.  LEFT VENTRICLE PLAX 2D  LVIDd:         3.70 cm  Diastology LVIDs:         1.60 cm  LV e' medial:    6.42 cm/s LV PW:         1.08 cm  LV E/e' medial:  10.5 LV IVS:  1.09 cm  LV e' lateral:   5.44 cm/s LVOT diam:     2.00 cm  LV E/e' lateral: 12.4 LVOT Area:     3.14 cm  RIGHT VENTRICLE RV S prime:     14.80 cm/s TAPSE (M-mode): 2.4 cm LEFT ATRIUM             Index       RIGHT ATRIUM           Index LA diam:        3.80 cm 1.81 cm/m  RA Area:     12.40 cm LA Vol (A2C):   29.7 ml 14.15 ml/m RA Volume:   31.50 ml  15.01 ml/m LA Vol (A4C):   26.5 ml 12.63 ml/m LA Biplane Vol: 28.7 ml 13.68 ml/m   AORTA Ao Root diam: 2.40 cm MITRAL VALVE                TRICUSPID VALVE MV Area (PHT): 2.08 cm     TR Peak grad:   12.8 mmHg MV Decel Time: 365 msec     TR Vmax:        179.00 cm/s MV E velocity: 67.70 cm/s MV A velocity: 125.00 cm/s  SHUNTS MV E/A ratio:  0.54         Systemic Diam: 2.00 cm Jenkins Rouge MD Electronically signed by Jenkins Rouge MD Signature Date/Time: 05/02/2020/12:29:36 PM    Final    [place] CORE BIOPSY (SOFT TISSUE)  Result Date: 04/05/2020 CLINICAL DATA:  LEFT chest wall mass by CT. Scarring at LEFT chest wall secondary to prior LEFT breast cancer post BILATERAL mastectomy and LEFT axillary node dissection. EXAM: US GUIDED CORE BIOPSY OF LEFT CHEST WALL MASS COMPARISON:  Ultrasound LEFT chest wall 04/05/2020, CT angio chest 04/04/2020 PROCEDURE: I met with the patient and we discussed the procedure of ultrasound-guided biopsy, including benefits and alternatives. We discussed the high likelihood of a successful procedure. We discussed the risks of the procedure, including infection and bleeding. Informed written consent was given. The usual time-out protocol was performed immediately prior to the procedure. Using sterile technique and 1% Lidocaine as local anesthetic, under direct ultrasound visualization, 18 gauge and 14 gauge spring-loaded devices were used to perform biopsy of LEFT chest wall mass using a inferior  to superior approach. Procedure tolerated well by patient without immediate complication. IMPRESSION: Ultrasound guided core biopsies of LEFT chest wall mass. No immediate complication. Electronically Signed   By: Lavonia Dana M.D.   On: 04/05/2020 14:50   [place] CHEST SOFT TISSUE  Result Date: 04/05/2020 CLINICAL DATA:  Abnormal CT chest, LEFT chest wall soft tissue mass, past history of BILATERAL mastectomy, LEFT breast cancer and axillary node dissection EXAM: ULTRASOUND OF CHEST SOFT TISSUES TECHNIQUE: Ultrasound examination of the chest wall soft tissues was performed in the area of clinical concern. COMPARISON:  CT angio chest 04/04/2020 FINDINGS: At the site of clinical concern, and irregular firm hypoechoic deep subcutaneous soft tissue nodule is identified which measures approximately 4.0 x 1.5 x 3.3 cm in size. Abnormality corresponds to the irregular soft tissue mass seen on CT and is concerning for chest wall recurrence of tumor. Mass appears hypovascular. No definite fluid collections or calcifications. IMPRESSION: 4.0 cm diameter deep subcutaneous soft tissue mass of the LEFT chest wall corresponding to CT abnormality, question tumor recurrence of the chest wall. Lesion is amenable to percutaneous ultrasound-guided core biopsy. Electronically Signed   By: Lavonia Dana M.D.   On: 04/05/2020  12:17     CBC Recent Labs  Lab 05/01/20 0824 05/02/20 0618 05/02/20 1415 05/03/20 0502 05/04/20 0504  WBC 8.4 7.1  --  8.3 6.9  HGB 8.8* 7.2* 8.8* 8.7* 8.3*  HCT 27.7* 23.8* 28.0* 28.2* 27.1*  PLT 205 180  --  152 161  MCV 83.9 86.2  --  84.4 85.2  MCH 26.7 26.1  --  26.0 26.1  MCHC 31.8 30.3  --  30.9 30.6  RDW 16.7* 16.8*  --  16.7* 16.8*  LYMPHSABS 1.2  --   --   --   --   MONOABS 0.7  --   --   --   --   EOSABS 0.2  --   --   --   --   BASOSABS 0.0  --   --   --   --     Chemistries  Recent Labs  Lab 05/01/20 0824 05/02/20 0618 05/03/20 0502 05/04/20 0504  NA 139 138 140 139  K 3.4*  3.3* 4.1 3.4*  CL 102 103 108 107  CO2 24 24 21* 22  GLUCOSE 119* 105* 86 100*  BUN 42* 34* 26* 19  CREATININE 1.76* 1.56* 1.49* 1.49*  CALCIUM 9.9 9.5 9.5 9.3  MG  --   --  1.8  --   AST 26  --   --   --   ALT 14  --   --   --   ALKPHOS 128*  --   --   --   BILITOT 0.6  --   --   --    ------------------------------------------------------------------------------------------------------------------ No results for input(s): CHOL, HDL, LDLCALC, TRIG, CHOLHDL, LDLDIRECT in the last 72 hours.  Lab Results  Component Value Date   HGBA1C 5.9 (H) 03/01/2020   ------------------------------------------------------------------------------------------------------------------ No results for input(s): TSH, T4TOTAL, T3FREE, THYROIDAB in the last 72 hours.  Invalid input(s): FREET3 ------------------------------------------------------------------------------------------------------------------ No results for input(s): VITAMINB12, FOLATE, FERRITIN, TIBC, IRON, RETICCTPCT in the last 72 hours.  Coagulation profile No results for input(s): INR, PROTIME in the last 168 hours.  No results for input(s): DDIMER in the last 72 hours.  Cardiac Enzymes No results for input(s): CKMB, TROPONINI, MYOGLOBIN in the last 168 hours.  Invalid input(s): CK ------------------------------------------------------------------------------------------------------------------    Component Value Date/Time   BNP 92.0 04/04/2020 1339   Mycal Conde M.D on 05/04/2020 at 6:08 PM  Go to [email or web address] - for contact info  [facility] - Office  [phone]

## 2020-05-04 NOTE — Progress Notes (Signed)
Maylon Peppers, M.D. Gastroenterology & Hepatology   Interval History: No acute overnight. Patient states that she has not had any more bowel movement since she had her last colonoscopy yesterday. Denies any nausea, vomiting, fever, chills, abdominal pain, chest pain or shortness of breath. Repeat hemoglobin today was 8.3, stable compared to prior as of yesterday was 8.7.  BUN has also remained low 19. Patient swallowed capsule endoscopy today in the morning.  Inpatient Medications:  Current Facility-Administered Medications:  .  0.9 %  sodium chloride infusion, 250 mL, Intravenous, PRN, Emokpae, Courage, MD .  acetaminophen (TYLENOL) tablet 650 mg, 650 mg, Oral, Q6H PRN, 650 mg at 05/01/20 2336 **OR** acetaminophen (TYLENOL) suppository 650 mg, 650 mg, Rectal, Q6H PRN, Emokpae, Courage, MD .  bisacodyl (DULCOLAX) suppository 10 mg, 10 mg, Rectal, Daily PRN, Emokpae, Courage, MD .  carvedilol (COREG) tablet 12.5 mg, 12.5 mg, Oral, BID WC, Emokpae, Courage, MD, 12.5 mg at 05/04/20 0919 .  cholecalciferol (VITAMIN D) tablet 5,000 Units, 5,000 Units, Oral, Daily, Denton Brick, Courage, MD, 5,000 Units at 05/04/20 0918 .  gabapentin (NEURONTIN) capsule 100 mg, 100 mg, Oral, TID, Emokpae, Courage, MD, 100 mg at 05/04/20 0918 .  hydrALAZINE (APRESOLINE) tablet 25 mg, 25 mg, Oral, TID, Emokpae, Courage, MD, 25 mg at 05/04/20 0918 .  HYDROcodone-acetaminophen (NORCO/VICODIN) 5-325 MG per tablet 1 tablet, 1 tablet, Oral, Q12H PRN, Emokpae, Courage, MD .  isosorbide mononitrate (IMDUR) 24 hr tablet 30 mg, 30 mg, Oral, Daily, Emokpae, Courage, MD, 30 mg at 05/03/20 1400 .  labetalol (NORMODYNE) injection 10 mg, 10 mg, Intravenous, Q4H PRN, Emokpae, Courage, MD .  levothyroxine (SYNTHROID) tablet 137 mcg, 137 mcg, Oral, QAC breakfast, Emokpae, Courage, MD, 137 mcg at 05/02/20 0509 .  omega-3 acid ethyl esters (LOVAZA) capsule 1 g, 1 capsule, Oral, Daily, Emokpae, Courage, MD, 1 g at 05/04/20 0918 .   ondansetron (ZOFRAN) tablet 4 mg, 4 mg, Oral, Q6H PRN **OR** ondansetron (ZOFRAN) injection 4 mg, 4 mg, Intravenous, Q6H PRN, Emokpae, Courage, MD .  pantoprazole (PROTONIX) injection 40 mg, 40 mg, Intravenous, Q12H, Emokpae, Courage, MD, 40 mg at 05/04/20 0918 .  polyethylene glycol (MIRALAX / GLYCOLAX) packet 17 g, 17 g, Oral, Daily PRN, Emokpae, Courage, MD .  potassium chloride SA (KLOR-CON) CR tablet 40 mEq, 40 mEq, Oral, Q3H, Emokpae, Courage, MD, 40 mEq at 05/04/20 0918 .  rosuvastatin (CRESTOR) tablet 40 mg, 40 mg, Oral, Daily, Emokpae, Courage, MD, 40 mg at 05/04/20 0919 .  senna-docusate (Senokot-S) tablet 2 tablet, 2 tablet, Oral, QHS, Emokpae, Courage, MD, 2 tablet at 05/03/20 2227 .  sodium chloride flush (NS) 0.9 % injection 3 mL, 3 mL, Intravenous, Q12H, Emokpae, Courage, MD, 3 mL at 05/03/20 2219 .  sodium chloride flush (NS) 0.9 % injection 3 mL, 3 mL, Intravenous, Q12H, Emokpae, Courage, MD, 3 mL at 05/03/20 2218 .  sodium chloride flush (NS) 0.9 % injection 3 mL, 3 mL, Intravenous, PRN, Emokpae, Courage, MD .  traZODone (DESYREL) tablet 50 mg, 50 mg, Oral, QHS PRN, Denton Brick, Courage, MD   I/O    Intake/Output Summary (Last 24 hours) at 05/04/2020 1304 Last data filed at 05/04/2020 0900 Gross per 24 hour  Intake 193.95 ml  Output --  Net 193.95 ml     Physical Exam: Temp:  [98 F (36.7 C)-98.4 F (36.9 C)] 98 F (36.7 C) (04/15 0755) Pulse Rate:  [60-75] 75 (04/15 0755) Resp:  [16-18] 16 (04/15 0755) BP: (131-174)/(39-60) 174/59 (04/15 0755) SpO2:  [98 %-99 %]  99 % (04/15 0755)  Temp (24hrs), Avg:98.2 F (36.8 C), Min:98 F (36.7 C), Max:98.4 F (36.9 C) GENERAL: The patient is AO x3, in no acute distress. Obese. HEENT: Head is normocephalic and atraumatic. EOMI are intact. Mouth is well hydrated and without lesions. NECK: Supple. No masses LUNGS: Clear to auscultation. No presence of rhonchi/wheezing/rales. Adequate chest expansion HEART: RRR, normal s1 and  s2. ABDOMEN: Soft, nontender, no guarding, no peritoneal signs, and nondistended. BS +. No masses. EXTREMITIES: Without any cyanosis, clubbing, rash, lesions or edema. NEUROLOGIC: AOx3, no focal motor deficit. SKIN: no jaundice, no rashes  Laboratory Data: CBC:     Component Value Date/Time   WBC 6.9 05/04/2020 0504   RBC 3.18 (L) 05/04/2020 0504   HGB 8.3 (L) 05/04/2020 0504   HGB 10.3 (L) 04/11/2020 0958   HCT 27.1 (L) 05/04/2020 0504   HCT 31.2 (L) 04/11/2020 0958   PLT 161 05/04/2020 0504   PLT 192 04/11/2020 0958   MCV 85.2 05/04/2020 0504   MCV 78 (L) 04/11/2020 0958   MCH 26.1 05/04/2020 0504   MCHC 30.6 05/04/2020 0504   RDW 16.8 (H) 05/04/2020 0504   RDW 15.1 04/11/2020 0958   LYMPHSABS 1.2 05/01/2020 0824   MONOABS 0.7 05/01/2020 0824   EOSABS 0.2 05/01/2020 0824   BASOSABS 0.0 05/01/2020 0824   COAG:  Lab Results  Component Value Date   INR 1.09 03/20/2010    BMP:  BMP Latest Ref Rng & Units 05/04/2020 05/03/2020 05/02/2020  Glucose 70 - 99 mg/dL 100(H) 86 105(H)  BUN 8 - 23 mg/dL 19 26(H) 34(H)  Creatinine 0.44 - 1.00 mg/dL 1.49(H) 1.49(H) 1.56(H)  BUN/Creat Ratio 12 - 28 - - -  Sodium 135 - 145 mmol/L 139 140 138  Potassium 3.5 - 5.1 mmol/L 3.4(L) 4.1 3.3(L)  Chloride 98 - 111 mmol/L 107 108 103  CO2 22 - 32 mmol/L 22 21(L) 24  Calcium 8.9 - 10.3 mg/dL 9.3 9.5 9.5    HEPATIC:  Hepatic Function Latest Ref Rng & Units 05/03/2020 05/01/2020 04/04/2020  Total Protein 6.5 - 8.1 g/dL - 7.6 7.5  Albumin 3.5 - 5.0 g/dL 3.4(L) 3.9 3.9  AST 15 - 41 U/L - 26 29  ALT 0 - 44 U/L - 14 14  Alk Phosphatase 38 - 126 U/L - 128(H) 100  Total Bilirubin 0.3 - 1.2 mg/dL - 0.6 0.9  Bilirubin, Direct 0.0 - 0.3 mg/dL - - -    CARDIAC:  Lab Results  Component Value Date   CKTOTAL 206 (H) 09/18/2011   CKMB (HH) 03/20/2010    7.3 CRITICAL VALUE NOTED.  VALUE IS CONSISTENT WITH PREVIOUSLY REPORTED AND CALLED VALUE.   TROPONINI <0.03 04/26/2015      Imaging: I personally  reviewed and interpreted the available labs, imaging and endoscopic files.   Assessment/Plan: 80 y.o.year old femalewith history of NSTEMI, cardiomyopathy, CAD, carotid artery disease, HTN, HLD, hypothyroidism, type 2 diabetes, stroke, recurrent left breast cancer with bone metastasis, and a left upper extremity DVT, who comes to the hospital after presenting pain in her chest, found to have anemia in her admission blood work-up.  Patient reported some questionable episodes of melena recently.  Notably, her iron stores were normal.  Patient underwent an EGD Which showed a 2 cm hiatal hernia, with normal stomach and small bowel.  Colonoscopy was performed which showed presence of diverticulosis and 5 polyps in the ascending colon which were removed with a cold snare (pathology is pending, less than  8 mm in size), normal terminal ileum, nonbleeding hemorrhoids.  Patient has remained hemodynamic stable without any further significant drop in her hemoglobin.  She has not had an active gastrointestinal bleeding at this point but we will proceed with a capsule endoscopy today to determine if there is any other alteration in her gastrointestinal lining causing her anemia.  For now, she can stop taking Protonix twice a day and go back to her home dosage.  #Normocytic anemia #Melena -Proceed with capsule endoscopy today. -Switch back to pantoprazole 40 mg oral every day -Check H&H every day  Maylon Peppers, MD Gastroenterology and Hepatology [facility]

## 2020-05-05 DIAGNOSIS — K922 Gastrointestinal hemorrhage, unspecified: Secondary | ICD-10-CM

## 2020-05-05 LAB — BASIC METABOLIC PANEL
Anion gap: 8 (ref 5–15)
BUN: 20 mg/dL (ref 8–23)
CO2: 22 mmol/L (ref 22–32)
Calcium: 9.5 mg/dL (ref 8.9–10.3)
Chloride: 109 mmol/L (ref 98–111)
Creatinine, Ser: 1.63 mg/dL — ABNORMAL HIGH (ref 0.44–1.00)
GFR, Estimated: 31 mL/min — ABNORMAL LOW (ref >60)
Glucose, Bld: 131 mg/dL — ABNORMAL HIGH (ref 70–99)
Potassium: 3.7 mmol/L (ref 3.5–5.1)
Sodium: 139 mmol/L (ref 135–145)

## 2020-05-05 LAB — CBC
HCT: 28.2 % — ABNORMAL LOW (ref 36.0–46.0)
Hemoglobin: 8.7 g/dL — ABNORMAL LOW (ref 12.0–15.0)
MCH: 26.3 pg (ref 26.0–34.0)
MCHC: 30.9 g/dL (ref 30.0–36.0)
MCV: 85.2 fL (ref 80.0–100.0)
Platelets: 146 10*3/uL — ABNORMAL LOW (ref 150–400)
RBC: 3.31 MIL/uL — ABNORMAL LOW (ref 3.87–5.11)
RDW: 17.2 % — ABNORMAL HIGH (ref 11.5–15.5)
WBC: 7.8 10*3/uL (ref 4.0–10.5)
nRBC: 0 % (ref 0.0–0.2)

## 2020-05-05 LAB — GLUCOSE, CAPILLARY
Glucose-Capillary: 112 mg/dL — ABNORMAL HIGH (ref 70–99)
Glucose-Capillary: 129 mg/dL — ABNORMAL HIGH (ref 70–99)
Glucose-Capillary: 90 mg/dL (ref 70–99)

## 2020-05-05 MED ORDER — FERROUS SULFATE 325 (65 FE) MG PO TABS: 325.0000 mg | ORAL_TABLET | Freq: Every day | ORAL | 0 refills | Status: DC

## 2020-05-05 MED ORDER — FERROUS SULFATE 325 (65 FE) MG PO TABS
325.0000 mg | ORAL_TABLET | Freq: Every day | ORAL | Status: DC
  Administered 2020-05-05: 325 mg via ORAL
  Filled 2020-05-05: qty 1

## 2020-05-05 NOTE — Procedures (Signed)
Small Bowel [place] Capsule Study Procedure date:  05/04/2020  Referring Provider:  Dr. Roxan Hockey, MD PCP:  Dr. Posey Pronto, Colin Broach, MD  Indication for procedure:  Anemia  Findings:  Capsule reached colon. Prep was adequate throughout the small bowel.  No presence of hematin/fresh blood or any mucosal lesions were observed throughout the inspection of the stomach and small bowel.    First Gastric image:  00:00:43 First Duodenal image: 00:32:22 First Cecal image: 03:20:53 Gastric Passage time: 0h 37m Small Bowel Passage time:  2h 39m  Summary & Recommendations: The patient was not found to have any alteration in the lumen or mucosal lining of the small bowel.  Evaluation of the small bowel is adequate.  Notably, iron stores evaluated during this hospitalization were normal with borderline iron saturation.  No documented episodes of melena were seen during this hospitalization.  -Start ferrous sulfate 325 mg every day for 6 months -Follow-up with PCP and hematology as outpatient -Restart Eliquis today -Patient was advised to call back if she presents any new episodes of melena/hematochezia, a repeat capsule endoscopy will be scheduled at that time.  I personally communicated these recommendations to the patient  Maylon Peppers, MD Gastroenterology and Hepatology [facility]

## 2020-05-05 NOTE — Progress Notes (Signed)
Robin [name], M.D. Gastroenterology & Hepatology   Interval History:  No acute events overnight. Patient underwent capsule endoscopy yesterday, no presence of any alterations were found explained her previous episodes of melena.  There was no presence of any active bleeding or stigmata of previous bleeding. Patient has remained asymptomatic, tolerating diet adequately.  Denies having any more chest pain, nausea, vomiting, fever, chills, abdominal pain.  Stools were brown today morning. Hemoglobin has remained stable 8.3.  Inpatient Medications:  Current Facility-Administered Medications:  .  0.9 %  sodium chloride infusion, 250 mL, Intravenous, PRN, Emokpae, Courage, MD .  acetaminophen (TYLENOL) tablet 650 mg, 650 mg, Oral, Q6H PRN, 650 mg at 05/01/20 2336 **OR** acetaminophen (TYLENOL) suppository 650 mg, 650 mg, Rectal, Q6H PRN, Emokpae, Courage, MD .  bisacodyl (DULCOLAX) suppository 10 mg, 10 mg, Rectal, Daily PRN, Emokpae, Courage, MD .  carvedilol (COREG) tablet 12.5 mg, 12.5 mg, Oral, BID WC, Emokpae, Courage, MD, 12.5 mg at 05/04/20 1647 .  cholecalciferol (VITAMIN D) tablet 5,000 Units, 5,000 Units, Oral, Daily, Emokpae, Courage, MD, 5,000 Units at 05/05/20 1059 .  ferrous sulfate tablet 325 mg, 325 mg, Oral, Q breakfast, Montez Morita, Quillian Quince, MD, 325 mg at 05/05/20 1208 .  gabapentin (NEURONTIN) capsule 100 mg, 100 mg, Oral, TID, Emokpae, Courage, MD, 100 mg at 05/05/20 1057 .  hydrALAZINE (APRESOLINE) tablet 25 mg, 25 mg, Oral, TID, Emokpae, Courage, MD, 25 mg at 05/04/20 2254 .  HYDROcodone-acetaminophen (NORCO/VICODIN) 5-325 MG per tablet 1 tablet, 1 tablet, Oral, Q12H PRN, Emokpae, Courage, MD .  isosorbide mononitrate (IMDUR) 24 hr tablet 30 mg, 30 mg, Oral, Daily, Emokpae, Courage, MD, 30 mg at 05/05/20 1057 .  labetalol (NORMODYNE) injection 10 mg, 10 mg, Intravenous, Q4H PRN, Emokpae, Courage, MD .  levothyroxine (SYNTHROID) tablet 137 mcg, 137 mcg, Oral, QAC  breakfast, Emokpae, Courage, MD, 137 mcg at 05/05/20 0554 .  omega-3 acid ethyl esters (LOVAZA) capsule 1 g, 1 capsule, Oral, Daily, Emokpae, Courage, MD, 1 g at 05/05/20 1058 .  ondansetron (ZOFRAN) tablet 4 mg, 4 mg, Oral, Q6H PRN **OR** ondansetron (ZOFRAN) injection 4 mg, 4 mg, Intravenous, Q6H PRN, Emokpae, Courage, MD .  pantoprazole (PROTONIX) EC tablet 40 mg, 40 mg, Oral, Daily, Montez Morita, Robertt Buda, MD, 40 mg at 05/05/20 1058 .  polyethylene glycol (MIRALAX / GLYCOLAX) packet 17 g, 17 g, Oral, Daily PRN, Emokpae, Courage, MD .  rosuvastatin (CRESTOR) tablet 40 mg, 40 mg, Oral, Daily, Emokpae, Courage, MD, 40 mg at 05/05/20 1058 .  senna-docusate (Senokot-S) tablet 2 tablet, 2 tablet, Oral, QHS, Emokpae, Courage, MD, 2 tablet at 05/04/20 2253 .  sodium chloride flush (NS) 0.9 % injection 3 mL, 3 mL, Intravenous, Q12H, Emokpae, Courage, MD, 3 mL at 05/03/20 2219 .  sodium chloride flush (NS) 0.9 % injection 3 mL, 3 mL, Intravenous, Q12H, Emokpae, Courage, MD, 3 mL at 05/05/20 1101 .  sodium chloride flush (NS) 0.9 % injection 3 mL, 3 mL, Intravenous, PRN, Emokpae, Courage, MD .  traZODone (DESYREL) tablet 50 mg, 50 mg, Oral, QHS PRN, Denton Brick, Courage, MD   I/O    Intake/Output Summary (Last 24 hours) at 05/05/2020 1216 Last data filed at 05/05/2020 0900 Gross per 24 hour  Intake 600 ml  Output --  Net 600 ml     Physical Exam: Temp:  [97.2 F (36.2 C)-98.4 F (36.9 C)] 97.2 F (36.2 C) (04/16 0554) Pulse Rate:  [67-80] 70 (04/16 0859) Resp:  [16-18] 16 (04/16 0554) BP: (128-164)/(42-65) 128/42 (04/16 0859)  SpO2:  [99 %] 99 % (04/16 0554)  Temp (24hrs), Avg:97.8 F (36.6 C), Min:97.2 F (36.2 C), Max:98.4 F (36.9 C) GENERAL: The patient is AO x3, in no acute distress. Obese. HEENT: Head is normocephalic and atraumatic. EOMI are intact. Mouth is well hydrated and without lesions. NECK: Supple. No masses LUNGS: Clear to auscultation. No presence of  rhonchi/wheezing/rales. Adequate chest expansion HEART: RRR, normal s1 and s2. ABDOMEN: Soft, nontender, no guarding, no peritoneal signs, and nondistended. BS +. No masses. EXTREMITIES: Without any cyanosis, clubbing, rash, lesions or edema. NEUROLOGIC: AOx3, no focal motor deficit. SKIN: no jaundice, no rashes  Laboratory Data: CBC:     Component Value Date/Time   WBC 7.8 05/05/2020 0927   RBC 3.31 (L) 05/05/2020 0927   HGB 8.7 (L) 05/05/2020 0927   HGB 10.3 (L) 04/11/2020 0958   HCT 28.2 (L) 05/05/2020 0927   HCT 31.2 (L) 04/11/2020 0958   PLT 146 (L) 05/05/2020 0927   PLT 192 04/11/2020 0958   MCV 85.2 05/05/2020 0927   MCV 78 (L) 04/11/2020 0958   MCH 26.3 05/05/2020 0927   MCHC 30.9 05/05/2020 0927   RDW 17.2 (H) 05/05/2020 0927   RDW 15.1 04/11/2020 0958   LYMPHSABS 1.2 05/01/2020 0824   MONOABS 0.7 05/01/2020 0824   EOSABS 0.2 05/01/2020 0824   BASOSABS 0.0 05/01/2020 0824   COAG:  Lab Results  Component Value Date   INR 1.09 03/20/2010    BMP:  BMP Latest Ref Rng & Units 05/05/2020 05/04/2020 05/03/2020  Glucose 70 - 99 mg/dL 131(H) 100(H) 86  BUN 8 - 23 mg/dL 20 19 26(H)  Creatinine 0.44 - 1.00 mg/dL 1.63(H) 1.49(H) 1.49(H)  BUN/Creat Ratio 12 - 28 - - -  Sodium 135 - 145 mmol/L 139 139 140  Potassium 3.5 - 5.1 mmol/L 3.7 3.4(L) 4.1  Chloride 98 - 111 mmol/L 109 107 108  CO2 22 - 32 mmol/L 22 22 21(L)  Calcium 8.9 - 10.3 mg/dL 9.5 9.3 9.5    HEPATIC:  Hepatic Function Latest Ref Rng & Units 05/03/2020 05/01/2020 04/04/2020  Total Protein 6.5 - 8.1 g/dL - 7.6 7.5  Albumin 3.5 - 5.0 g/dL 3.4(L) 3.9 3.9  AST 15 - 41 U/L - 26 29  ALT 0 - 44 U/L - 14 14  Alk Phosphatase 38 - 126 U/L - 128(H) 100  Total Bilirubin 0.3 - 1.2 mg/dL - 0.6 0.9  Bilirubin, Direct 0.0 - 0.3 mg/dL - - -    CARDIAC:  Lab Results  Component Value Date   CKTOTAL 206 (H) 09/18/2011   CKMB (HH) 03/20/2010    7.3 CRITICAL VALUE NOTED.  VALUE IS CONSISTENT WITH PREVIOUSLY REPORTED AND  CALLED VALUE.   TROPONINI <0.03 04/26/2015      Imaging: I personally reviewed and interpreted the available labs, imaging and endoscopic files.   Assessment/Plan: 80 y.o.year old femalewith history of NSTEMI, cardiomyopathy, CAD, carotid artery disease, HTN, HLD, hypothyroidism, type 2 diabetes, stroke, recurrent left breast cancer with bone metastasis, and a left upper extremity DVT, who comes to the hospital after presenting pain in her chest, found to have anemia in her admission blood work-up.  Patient reported some questionable episodes of melena recently.  Notably, her iron stores were normal.  Patient underwent an EGD Which showed a 2 cm hiatal hernia, with normal stomach and small bowel.  Colonoscopy was performed which showed presence of diverticulosis and 5 polyps in the ascending colon which were removed with a cold snare (  pathology consistent with TA without HGD, less than 8 mm in size), normal terminal ileum, nonbleeding hemorrhoids.  Subsequent endoscopy performed yesterday did not show any evidence of active bleeding or stigmata of bleeding, quality of the exam was adequate.  Patient has remained hemodynamic stable without any further significant drop in her hemoglobin.  She has not had an active gastrointestinal bleeding during her current hospitalization.  Due to this, she would benefit from continuing oral presentation daily as she had borderline hemoglobin saturation, but will recommend restarting her Eliquis at this point.  I advised the patient that if she had any more episodes of recurrent melena/hematochezia she should let us know schedule a repeat endoscopy.  Both the patient and the granddaughter understood and agreed  #Normocytic anemia #Melena -Start ferrous sulfate 325 mg every day for 6 months -Follow-up with PCP and hematology as outpatient -Restart Eliquis today -Patient was advised to call back if she presents any new episodes of melena/hematochezia, a repeat  capsule endoscopy will be scheduled at that time. - pantoprazole 40 mg oral every day - GI service will sign-off, please call us back if you have any more questions.  Robin Peppers, MD Gastroenterology and Hepatology [facility]

## 2020-05-05 NOTE — Progress Notes (Signed)
Nsg Discharge Note  Admit Date:  05/01/2020 Discharge date: 05/05/2020   Oneita [name] to be D/C'd Home per MD order.  AVS completed.  Copy for chart, and copy for patient signed, and dated. Patient/caregiver able to verbalize understanding.  Discharge Medication: Allergies as of 05/05/2020      Reactions   Codeine Nausea And Vomiting   Tramadol       Medication List    STOP taking these medications   hydrALAZINE 25 MG tablet Commonly known as: APRESOLINE   losartan-hydrochlorothiazide 50-12.5 MG tablet Commonly known as: HYZAAR     TAKE these medications   acetaminophen 325 MG tablet Commonly known as: TYLENOL Take 2 tablets (650 mg total) by mouth every 6 (six) hours as needed for mild pain, fever or headache (or Fever >/= 101).   amLODipine 10 MG tablet Commonly known as: NORVASC Take 1 tablet (10 mg total) by mouth daily.   apixaban 5 MG Tabs tablet Commonly known as: ELIQUIS Take 1 tablet (5 mg total) by mouth 2 (two) times daily. Start after you complete the initial starter pack of Eliquis Start taking on: May 06, 2020 What changed: Another medication with the same name was removed. Continue taking this medication, and follow the directions you see here.   aspirin 81 MG tablet Take 1 tablet (81 mg total) by mouth daily with breakfast.   carvedilol 12.5 MG tablet Commonly known as: COREG Take 1 tablet (12.5 mg total) by mouth 2 (two) times daily with a meal.   ferrous sulfate 325 (65 FE) MG tablet Take 1 tablet (325 mg total) by mouth daily with breakfast. Start taking on: May 06, 2020   furosemide 20 MG tablet Commonly known as: LASIX Take 1 tablet (20 mg total) by mouth daily.   gabapentin 100 MG capsule Commonly known as: NEURONTIN Take 1 capsule (100 mg total) by mouth 3 (three) times daily.   HYDROcodone-acetaminophen 5-325 MG tablet Commonly known as: NORCO/VICODIN Take 1 tablet by mouth every 12 (twelve) hours as needed for moderate pain.    isosorbide mononitrate 30 MG 24 hr tablet Commonly known as: IMDUR Take 1 tablet (30 mg total) by mouth daily.   levothyroxine 137 MCG tablet Commonly known as: Synthroid Take 1 tablet (137 mcg total) by mouth daily before breakfast.   nitroGLYCERIN 0.4 MG SL tablet Commonly known as: NITROSTAT Place 1 tablet (0.4 mg total) under the tongue every 5 (five) minutes x 3 doses as needed.   Omega-3 Fatty Acids 300 MG Caps Take 1 capsule (300 mg total) by mouth 2 (two) times daily.   pantoprazole 40 MG tablet Commonly known as: PROTONIX Take 1 tablet (40 mg total) by mouth daily.   potassium chloride 10 MEQ tablet Commonly known as: KLOR-CON Take 1 tablet (10 mEq total) by mouth daily. Take While taking Lasix/furosemide   rosuvastatin 40 MG tablet Commonly known as: CRESTOR Take 1 tablet (40 mg total) by mouth daily.   senna-docusate 8.6-50 MG tablet Commonly known as: Senokot-S Take 2 tablets by mouth at bedtime.   Vitamin D3 125 MCG (5000 UT) Caps TAKE 1 CAPSULE BY MOUTH ONCE DAILY       Discharge Assessment: Vitals:   05/05/20 0859 05/05/20 1409  BP: (!) 128/42 (!) 126/50  Pulse: 70 64  Resp:  18  Temp:  98.7 F (37.1 C)  SpO2:  98%   Skin clean, dry and intact without evidence of skin break down, no evidence of skin tears noted. IV  catheter discontinued intact. Site without signs and symptoms of complications - no redness or edema noted at insertion site, patient denies c/o pain - only slight tenderness at site.  Dressing with slight pressure applied.  D/c Instructions-Education: Discharge instructions given to patient/family with verbalized understanding. D/c education completed with patient/family including follow up instructions, medication list, d/c activities limitations if indicated, with other d/c instructions as indicated by MD - patient able to verbalize understanding, all questions fully answered. Patient instructed to return to ED, call 911, or call MD  for any changes in condition.  Patient escorted via [place], and D/C home via private auto.  Robin Mcmurray, LPN [id number] 8:20 PM

## 2020-05-05 NOTE — Discharge Summary (Signed)
Discharge Summary  Robin [name]:[id number] DOB: 29-Sep-1940  PCP: [name], MD  Admit date: 05/01/2020 Discharge date: 05/05/2020  Time spent: 30 minutes  Recommendations for Outpatient Follow-up:  1. Primary care provider 2. Pulmonology 3. Hematology  Discharge Diagnoses:  Active Hospital Problems   Diagnosis Date Noted  . Acute GI bleeding 05/01/2020  . Acute on chronic anemia Due to Acute Blood Loss/Gi Bleed 05/01/2020  . Malignant neoplasm of left breast in female, estrogen receptor positive ([place])   . Acute DVT (deep venous thrombosis) --LtUE---03/2020 04/04/2020  . Hypothyroidism 09/19/2011  . Secondary cardiomyopathy ([place]) 04/09/2010  . Essential hypertension, benign 04/09/2010    Resolved Hospital Problems  No resolved problems to display.    Discharge Condition: Improved  Diet recommendation: Per home  Vitals:   05/05/20 0859 05/05/20 1409  BP: (!) 128/42 (!) 126/50  Pulse: 70 64  Resp:  18  Temp:  98.7 F (37.1 C)  SpO2:  98%    History of present illness:  80 year old female with history of NSTEMI, cardiomyopathy, coronary disease, carotid artery disease, hypertension, lipidemia, hypothyroidism, type 2 diabetes mellitus, stroke, current recurrent left breast cancer, with bone metastasis, and left upper extremity DVT.  Patient was admitted for chest pain supposedly dark stool but per GI there is no indication of active or previous bleeding the capsule endoscopy was normal.  She also underwent EGD showed small hiatal hernia and colonoscopy showed diverticulosis and 5 polyps in the ascending colon which were removed with a cold snare and and nonbleeding hemorrhoid.  GI recommends for her to follow-up with primary care and hematology to try to find out the source of her anemia and bleeding and to return if she has a new episode of bleeding Her Eliquis was restarted today.  She is to continue pantoprazole 40 mg daily and ferrous sulfate 325 mg every day  for 6 months  Hospital Course:  Principal Problem:   Acute GI bleeding Active Problems:   Secondary cardiomyopathy ([place])   Essential hypertension, benign   Hypothyroidism   Acute DVT (deep venous thrombosis) --LtUE---03/2020   Malignant neoplasm of left breast in female, estrogen receptor positive ([place])   Acute on chronic anemia Due to Acute Blood Loss/Gi Bleed   Procedures:  EGD  Colonoscopy  Capsule endoscopy May 04, 2020  Consultations:  Gastroenterology  Discharge Exam: BP (!) 126/50 (BP Location: Right Arm)   Pulse 64   Temp 98.7 F (37.1 C) (Oral)   Resp 18   Ht 5\' 6"  (1.676 m)   Wt 101.6 kg   SpO2 98%   BMI 36.15 kg/m   General: Obese female in no distress her granddaughter [place] was at bedside Cardiovascular: Regular rate and rhythm Respiratory: Clear to auscultation bilaterally  Discharge Instructions You were cared for by a hospitalist during your hospital stay. If you have any questions about your discharge medications or the care you received while you were in the hospital after you are discharged, you can call the unit and asked to speak with the hospitalist on call if the hospitalist that took care of you is not available. Once you are discharged, your primary care physician will handle any further medical issues. Please note that NO REFILLS for any discharge medications will be authorized once you are discharged, as it is imperative that you return to your primary care physician (or establish a relationship with a primary care physician if you do not have one) for your aftercare needs so that they  can reassess your need for medications and monitor your lab values.  Discharge Instructions    Call MD for:   Complete by: As directed    Call MD for:  difficulty breathing, headache or visual disturbances   Complete by: As directed    Call MD for:  extreme fatigue   Complete by: As directed    Call MD for:  temperature >100.4   Complete by: As directed     Diet - low sodium heart healthy   Complete by: As directed    Discharge instructions   Complete by: As directed    Follow-up with primary care provider and outpatient hematology for further investigation of the cause of anemia   Increase activity slowly   Complete by: As directed      Allergies as of 05/05/2020      Reactions   Codeine Nausea And Vomiting   Tramadol       Medication List    STOP taking these medications   hydrALAZINE 25 MG tablet Commonly known as: APRESOLINE   losartan-hydrochlorothiazide 50-12.5 MG tablet Commonly known as: HYZAAR     TAKE these medications   acetaminophen 325 MG tablet Commonly known as: TYLENOL Take 2 tablets (650 mg total) by mouth every 6 (six) hours as needed for mild pain, fever or headache (or Fever >/= 101).   amLODipine 10 MG tablet Commonly known as: NORVASC Take 1 tablet (10 mg total) by mouth daily.   apixaban 5 MG Tabs tablet Commonly known as: ELIQUIS Take 1 tablet (5 mg total) by mouth 2 (two) times daily. Start after you complete the initial starter pack of Eliquis Start taking on: May 06, 2020 What changed: Another medication with the same name was removed. Continue taking this medication, and follow the directions you see here.   aspirin 81 MG tablet Take 1 tablet (81 mg total) by mouth daily with breakfast.   carvedilol 12.5 MG tablet Commonly known as: COREG Take 1 tablet (12.5 mg total) by mouth 2 (two) times daily with a meal.   ferrous sulfate 325 (65 FE) MG tablet Take 1 tablet (325 mg total) by mouth daily with breakfast. Start taking on: May 06, 2020   furosemide 20 MG tablet Commonly known as: LASIX Take 1 tablet (20 mg total) by mouth daily.   gabapentin 100 MG capsule Commonly known as: NEURONTIN Take 1 capsule (100 mg total) by mouth 3 (three) times daily.   HYDROcodone-acetaminophen 5-325 MG tablet Commonly known as: NORCO/VICODIN Take 1 tablet by mouth every 12 (twelve) hours as  needed for moderate pain.   isosorbide mononitrate 30 MG 24 hr tablet Commonly known as: IMDUR Take 1 tablet (30 mg total) by mouth daily.   levothyroxine 137 MCG tablet Commonly known as: Synthroid Take 1 tablet (137 mcg total) by mouth daily before breakfast.   nitroGLYCERIN 0.4 MG SL tablet Commonly known as: NITROSTAT Place 1 tablet (0.4 mg total) under the tongue every 5 (five) minutes x 3 doses as needed.   Omega-3 Fatty Acids 300 MG Caps Take 1 capsule (300 mg total) by mouth 2 (two) times daily.   pantoprazole 40 MG tablet Commonly known as: PROTONIX Take 1 tablet (40 mg total) by mouth daily.   potassium chloride 10 MEQ tablet Commonly known as: KLOR-CON Take 1 tablet (10 mEq total) by mouth daily. Take While taking Lasix/furosemide   rosuvastatin 40 MG tablet Commonly known as: CRESTOR Take 1 tablet (40 mg total) by mouth daily.  senna-docusate 8.6-50 MG tablet Commonly known as: Senokot-S Take 2 tablets by mouth at bedtime.   Vitamin D3 125 MCG (5000 UT) Caps TAKE 1 CAPSULE BY MOUTH ONCE DAILY      Allergies  Allergen Reactions  . Codeine Nausea And Vomiting  . Tramadol       The results of significant diagnostics from this hospitalization (including imaging, microbiology, ancillary and laboratory) are listed below for reference.    Significant Diagnostic Studies: DG Chest Port 1 View  Result Date: 05/01/2020 CLINICAL DATA:  Upper chest pain radiating to the back with nausea. EXAM: PORTABLE CHEST 1 VIEW COMPARISON:  None. FINDINGS: Enlarged cardiac silhouette. Tortuous and portion of the calcified thoracic aorta. Mildly prominent interstitial markings with normal vasculature. No right pleural fluid seen. Mild predominantly linear density in the left lower lung zone with a small amount of pleural thickening or fluid. Thoracic spine degenerative changes. IMPRESSION: 1. Cardiomegaly and mild chronic interstitial lung disease. 2. Mild left basilar  atelectasis or scarring with a small amount of pleural thickening or fluid. Electronically Signed   By: Claudie Revering M.D.   On: 05/01/2020 08:44   ECHOCARDIOGRAM COMPLETE  Result Date: 05/02/2020    ECHOCARDIOGRAM REPORT   Patient Name:   Robin [name] Date of Exam: 05/02/2020 Medical Rec #:  [id number]        Height:       66.0 in Accession #:    [id number]       Weight:       224.0 lb Date of Birth:  April 08, 1940        BSA:          2.098 m Patient Age:    33 years         BP:           118/44 mmHg Patient Gender: F                HR:           57 bpm. Exam Location:  [facility] Procedure: 2D Echo Indications:    Abnormal ECG R94.31  History:        Patient has prior history of Echocardiogram examinations, most                 recent 10/27/2019. CAD; Risk Factors:Non-Smoker, Dyslipidemia and                 Hypertension.  Sonographer:    Leavy Cella RDCS (AE) Referring Phys: [id number] COURAGE EMOKPAE IMPRESSIONS  1. Left ventricular ejection fraction, by estimation, is 60 to 65%. The left ventricle has normal function. The left ventricle has no regional wall motion abnormalities. Left ventricular diastolic parameters are consistent with Grade I diastolic dysfunction (impaired relaxation).  2. Right ventricular systolic function is normal. The right ventricular size is normal.  3. Left atrial size was mildly dilated.  4. The mitral valve is abnormal. No evidence of mitral valve regurgitation. No evidence of mitral stenosis. Moderate mitral annular calcification.  5. The aortic valve is tricuspid. There is mild calcification of the aortic valve. Aortic valve regurgitation is not visualized. Mild to moderate aortic valve sclerosis/calcification is present, without any evidence of aortic stenosis.  6. The inferior vena cava is normal in size with greater than 50% respiratory variability, suggesting right atrial pressure of 3 mmHg. FINDINGS  Left Ventricle: Left ventricular ejection fraction, by estimation, is 60  to 65%. The left ventricle has normal function. The left ventricle  has no regional wall motion abnormalities. The left ventricular internal cavity size was normal in size. There is  no left ventricular hypertrophy. Left ventricular diastolic parameters are consistent with Grade I diastolic dysfunction (impaired relaxation). Right Ventricle: The right ventricular size is normal. No increase in right ventricular wall thickness. Right ventricular systolic function is normal. Left Atrium: Left atrial size was mildly dilated. Right Atrium: Right atrial size was normal in size. Pericardium: There is no evidence of pericardial effusion. Mitral Valve: The mitral valve is abnormal. There is mild thickening of the mitral valve leaflet(s). There is mild calcification of the mitral valve leaflet(s). Moderate mitral annular calcification. No evidence of mitral valve regurgitation. No evidence  of mitral valve stenosis. Tricuspid Valve: The tricuspid valve is normal in structure. Tricuspid valve regurgitation is trivial. No evidence of tricuspid stenosis. Aortic Valve: The aortic valve is tricuspid. There is mild calcification of the aortic valve. Aortic valve regurgitation is not visualized. Mild to moderate aortic valve sclerosis/calcification is present, without any evidence of aortic stenosis. Pulmonic Valve: The pulmonic valve was normal in structure. Pulmonic valve regurgitation is not visualized. No evidence of pulmonic stenosis. Aorta: The aortic root is normal in size and structure. Venous: The inferior vena cava is normal in size with greater than 50% respiratory variability, suggesting right atrial pressure of 3 mmHg. IAS/Shunts: No atrial level shunt detected by color flow Doppler.  LEFT VENTRICLE PLAX 2D LVIDd:         3.70 cm  Diastology LVIDs:         1.60 cm  LV e' medial:    6.42 cm/s LV PW:         1.08 cm  LV E/e' medial:  10.5 LV IVS:        1.09 cm  LV e' lateral:   5.44 cm/s LVOT diam:     2.00 cm  LV E/e'  lateral: 12.4 LVOT Area:     3.14 cm  RIGHT VENTRICLE RV S prime:     14.80 cm/s TAPSE (M-mode): 2.4 cm LEFT ATRIUM             Index       RIGHT ATRIUM           Index LA diam:        3.80 cm 1.81 cm/m  RA Area:     12.40 cm LA Vol (A2C):   29.7 ml 14.15 ml/m RA Volume:   31.50 ml  15.01 ml/m LA Vol (A4C):   26.5 ml 12.63 ml/m LA Biplane Vol: 28.7 ml 13.68 ml/m   AORTA Ao Root diam: 2.40 cm MITRAL VALVE                TRICUSPID VALVE MV Area (PHT): 2.08 cm     TR Peak grad:   12.8 mmHg MV Decel Time: 365 msec     TR Vmax:        179.00 cm/s MV E velocity: 67.70 cm/s MV A velocity: 125.00 cm/s  SHUNTS MV E/A ratio:  0.54         Systemic Diam: 2.00 cm Jenkins Rouge MD Electronically signed by Jenkins Rouge MD Signature Date/Time: 05/02/2020/12:29:36 PM    Final     Microbiology: Recent Results (from the past 240 hour(s))  SARS CORONAVIRUS 2 (TAT 6-24 HRS) Nasopharyngeal Nasopharyngeal Swab     Status: None   Collection Time: 05/01/20  4:20 PM   Specimen: Nasopharyngeal Swab  Result Value Ref Range Status  SARS Coronavirus 2 NEGATIVE NEGATIVE Final    Comment: (NOTE) SARS-CoV-2 target nucleic acids are NOT DETECTED.  The SARS-CoV-2 RNA is generally detectable in upper and lower respiratory specimens during the acute phase of infection. Negative results do not preclude SARS-CoV-2 infection, do not rule out co-infections with other pathogens, and should not be used as the sole basis for treatment or other patient management decisions. Negative results must be combined with clinical observations, patient history, and epidemiological information. The expected result is Negative.  Fact Sheet for Patients: SugarRoll.be  Fact Sheet for Healthcare Providers: https://www.woods-mathews.com/  This test is not yet approved or cleared by the [place] FDA and  has been authorized for detection and/or diagnosis of SARS-CoV-2 by FDA under an Emergency  Use Authorization (EUA). This EUA will remain  in effect (meaning this test can be used) for the duration of the COVID-19 declaration under Se ction 564(b)(1) of the Act, 21 U.S.C. section 360bbb-3(b)(1), unless the authorization is terminated or revoked sooner.  Performed at [facility], [address] [place], [place] [address]   Culture, Urine     Status: Abnormal   Collection Time: 05/01/20  4:22 PM   Specimen: Urine, Clean Catch  Result Value Ref Range Status   Specimen Description   Final    URINE, CLEAN CATCH Performed at [facility], [address], [place], [place] [address]    Special Requests   Final    NONE Performed at [facility], [address], [place], [place] [address]    Culture (A)  Final    20,000 COLONIES/mL MULTIPLE SPECIES PRESENT, SUGGEST RECOLLECTION   Report Status 05/03/2020 FINAL  Final     Labs: Basic Metabolic Panel: Recent Labs  Lab 05/01/20 0824 05/02/20 0618 05/03/20 0502 05/04/20 0504 05/05/20 0927  NA 139 138 140 139 139  K 3.4* 3.3* 4.1 3.4* 3.7  CL 102 103 108 107 109  CO2 24 24 21* 22 22  GLUCOSE 119* 105* 86 100* 131*  BUN 42* 34* 26* 19 20  CREATININE 1.76* 1.56* 1.49* 1.49* 1.63*  CALCIUM 9.9 9.5 9.5 9.3 9.5  MG  --   --  1.8  --   --   PHOS  --   --  2.6  --   --    Liver Function Tests: Recent Labs  Lab 05/01/20 0824 05/03/20 0502  AST 26  --   ALT 14  --   ALKPHOS 128*  --   BILITOT 0.6  --   PROT 7.6  --   ALBUMIN 3.9 3.4*   No results for input(s): LIPASE, AMYLASE in the last 168 hours. No results for input(s): AMMONIA in the last 168 hours. CBC: Recent Labs  Lab 05/01/20 0824 05/02/20 0618 05/02/20 1415 05/03/20 0502 05/04/20 0504 05/05/20 0927  WBC 8.4 7.1  --  8.3 6.9 7.8  NEUTROABS 6.1  --   --   --   --   --   HGB 8.8* 7.2* 8.8* 8.7* 8.3* 8.7*  HCT 27.7* 23.8* 28.0* 28.2* 27.1* 28.2*  MCV 83.9 86.2  --  84.4 85.2 85.2  PLT 205 180  --  152 161 146*   Cardiac Enzymes: No results for  input(s): CKTOTAL, CKMB, CKMBINDEX, TROPONINI in the last 168 hours. BNP: BNP (last 3 results) Recent Labs    04/04/20 1339  BNP 92.0    ProBNP (last 3 results) No results for input(s): PROBNP in the last 8760 hours.  CBG: Recent  Labs  Lab 05/04/20 1140 05/04/20 1937 05/05/20 0018 05/05/20 0607 05/05/20 1108  GLUCAP 93 111* 112* 90 129*       Signed:  Cristal Deer, MD [facility] 05/05/2020, 3:27 PM

## 2020-05-06 NOTE — Progress Notes (Signed)
This is a telemedicine visit Patient location: Home Provider location: Office  Patient identified by using 2 patient identifiers.  Cardiology Office Note  Date: 05/07/2020   ID: Robin [name], DOB [birth year], MRN [id number]  PCP:  [name], MD  Cardiologist:  Rozann Lesches, MD Electrophysiologist:  None   Chief Complaint: Hospital follow up DVT  History of Present Illness: Robin [name] is a 80 y.o. female with a history of cardiomyopathy, CAD, carotid artery disease, HTN, HLD, hypothyroidism, hepatic steatosis, breast CA, CVA, DM   Last encounter with [name] via telemedicine 04/25/2019. She did not report any anginal symptoms or nitroglycerin use. Stable NYHA class II dyspnea with basic ADLs. Follow-up echocardiogram was pending. She preferred to hold off on getting this done. Planned to repeat study prior to next office visit. Medications were reviewed. Aspirin, Norvasc, Coreg, Plavix, Hyzaar, Crestor. Nitroglycerin sublingual available. Echo was ordered prior to next 52-monthvisit. Systolic blood pressure was in the 140s. She was to continue Norvasc, Coreg, and Hyzaar. CAD status post DES to second diagonal 2012. Continue observation and medical therapy.   Recent hospital admission on 04/04/2020.  She had been seen by PCP and endocrinologist that day who recommended she go to the emergency room for elevated blood pressure of 207/98.  Complained of left arm swelling for the prior week.  Reported some dyspnea mainly with exertion.  In ED venous Doppler significant for extensive left upper extremity DVT.  CT chest with no evidence of PE but significant for breast cancer with mass and bony metastasis.  She was status post biopsy on 04/05/2020 with pathology consistent with adenocarcinoma.  Her left upper extremity DVT was treated with IV heparin and she was discharged on Eliquis in anticipation of lifelong anticoagulation given underlying malignancy.  She was continuing  amlodipine, Coreg, hydralazine, Imdur. Losartan/HCTZ was restarted with potassium supplementation.  Continuing aspirin and Crestor.   Recent hospital admission for acute GI bleeding on 05/01/2020.  She had an initial hemoglobin of 7.2.  Admitted for chest pain and dark stool but per GI there was no indication of active or previous bleeding via capsule endoscopy which was normal.  She underwent EGD showing small hiatal hernia and colonoscopy showing diverticulosis and 5 polyps in the ascending colon which were removed with cold snare and nonbleeding hemorrhoid.  She received 1 unit of packed red blood cells on 05/02/2020.  GI recommended for her to follow-up with primary care and hematology to try to find out the source of her anemia and bleeding.  Her Eliquis was restarted.  She was to continue Protonix 40 mg daily and ferrous sulfate 325 mg every day for 6 months.  According to notes she received a blood transfusion today at 10:30 AM.  Her hemoglobin and hematocrit on 05/05/2020 were 8.7 and 28.2 respectively.  Her losartan/HCTZ and hydralazine were stopped at discharge from recent hospital visit.  Blood pressure today 114/68, heart rate of 61.  Spoke to her by phone today.  She states she is now taking Lasix regularly which was prescribed 20 mg daily at discharge from recent hospital admission.  She states DVT in her left arm has improved.  She denies any obvious bleeding in stool or urine.  She has had a recent recurrence of breast CA with bone metastasis per hospital note.  She is following oncology/hematology.    Past Medical History:  Diagnosis Date  . [name]    Tamoxifen started 2007  .  CAD (coronary artery disease)    DES second diagonal 2/12  . Cardiomyopathy    Probable Takotsubo, LVEF 30-35% 2/12  . Carotid artery disease ([place])   . Essential hypertension   . Hepatic steatosis   . Hyperlipidemia   . Hypothyroidism   . NSTEMI (non-ST elevated myocardial infarction) ([place])    2/12   . Pancreas divisum   . Stroke ([place])    (2000) residual left-sided weakness  . Type 2 diabetes mellitus ([place])     Past Surgical History:  Procedure Laterality Date  . ABDOMINAL HYSTERECTOMY    . CAROTID ENDARTERECTOMY     Left  . CHOLECYSTECTOMY    . COLONOSCOPY WITH PROPOFOL N/A 05/03/2020   Procedure: COLONOSCOPY WITH PROPOFOL;  Surgeon: Daneil Dolin, MD;  [facility];  Service: Endoscopy;  Laterality: N/A;  . ESOPHAGOGASTRODUODENOSCOPY (EGD) WITH PROPOFOL N/A 05/02/2020   Procedure: ESOPHAGOGASTRODUODENOSCOPY (EGD) WITH PROPOFOL;  Surgeon: Rogene Houston, MD;  [facility];  Service: Endoscopy;  Laterality: N/A;  . JOINT REPLACEMENT     Right knee replacement- [facility]. [facility]  . MASTECTOMY     Bilateral  . POLYPECTOMY  05/03/2020   Procedure: POLYPECTOMY;  Surgeon: Daneil Dolin, MD;  [facility]: [facility];  Service: Endoscopy;;    Current Outpatient Medications  Medication Sig Dispense Refill  . acetaminophen (TYLENOL) 325 MG tablet Take 2 tablets (650 mg total) by mouth every 6 (six) hours as needed for mild pain, fever or headache (or Fever >/= 101). 12 tablet 0  . amLODipine (NORVASC) 10 MG tablet Take 1 tablet (10 mg total) by mouth daily. 90 tablet 3  . apixaban (ELIQUIS) 5 MG TABS tablet Take 1 tablet (5 mg total) by mouth 2 (two) times daily. Start after you complete the initial starter pack of Eliquis 60 tablet 5  . aspirin 81 MG tablet Take 1 tablet (81 mg total) by mouth daily with breakfast. 30 tablet 3  . carvedilol (COREG) 12.5 MG tablet Take 1 tablet (12.5 mg total) by mouth 2 (two) times daily with a meal. 90 tablet 3  . Cholecalciferol (VITAMIN D3) 125 MCG (5000 UT) CAPS TAKE 1 CAPSULE BY MOUTH ONCE DAILY (Patient taking differently: Take 1 capsule by mouth daily.) 90 capsule 1  . ferrous sulfate 325 (65 FE) MG tablet Take 1 tablet (325 mg total) by mouth daily with breakfast. 30 tablet 0  . furosemide (LASIX) 20 MG tablet Take  1 tablet (20 mg total) by mouth daily. 90 tablet 1  . gabapentin (NEURONTIN) 100 MG capsule Take 1 capsule (100 mg total) by mouth 3 (three) times daily. 90 capsule 2  . HYDROcodone-acetaminophen (NORCO/VICODIN) 5-325 MG tablet Take 1 tablet by mouth every 12 (twelve) hours as needed for moderate pain. 20 tablet 0  . isosorbide mononitrate (IMDUR) 30 MG 24 hr tablet Take 1 tablet (30 mg total) by mouth daily. 90 tablet 2  . levothyroxine (SYNTHROID) 137 MCG tablet Take 1 tablet (137 mcg total) by mouth daily before breakfast. 90 tablet 2  . nitroGLYCERIN (NITROSTAT) 0.4 MG SL tablet Place 1 tablet (0.4 mg total) under the tongue every 5 (five) minutes x 3 doses as needed. 25 tablet 3  . Omega-3 Fatty Acids 300 MG CAPS Take 1 capsule (300 mg total) by mouth 2 (two) times daily.    . pantoprazole (PROTONIX) 40 MG tablet Take 1 tablet (40 mg total) by mouth daily. 30 tablet 3  . potassium chloride (KLOR-CON)  10 MEQ tablet Take 1 tablet (10 mEq total) by mouth daily. Take While taking Lasix/furosemide 30 tablet 2  . rosuvastatin (CRESTOR) 40 MG tablet Take 1 tablet (40 mg total) by mouth daily. 90 tablet 2  . senna-docusate (SENOKOT-S) 8.6-50 MG tablet Take 2 tablets by mouth at bedtime. 60 tablet 1   No current facility-administered medications for this visit.   Allergies:  Codeine and Tramadol   Social History: The patient  reports that she has never smoked. She has never used smokeless tobacco. She reports that she does not drink alcohol and does not use drugs.   Family History: The patient's family history includes Heart attack in her brother and sister; Heart disease in her brother and sister; Throat cancer in her brother.   ROS:  Please see the history of present illness. Otherwise, complete review of systems is positive for none.  All other systems are reviewed and negative.   Physical Exam: VS:  BP 114/68   Pulse 61   Resp 20 , BMI There is no height or weight on file to calculate  BMI.  Wt Readings from Last 3 Encounters:  05/01/20 224 lb (101.6 kg)  04/12/20 224 lb 9.6 oz (101.9 kg)  04/04/20 204 lb (92.5 kg)    Patient had a normal speech pattern during phone conversation.  No obvious dyspnea, wheezing, or cough.  ECG:  EKG June 28, 2019 sinus rhythm rate of 62, borderline T abnormalities, inferior leads, prolonged QT with QT C/QTc 541/550 ms  Recent Labwork: 03/29/2020: TSH 10.600 04/04/2020: B Natriuretic Peptide 92.0 05/01/2020: ALT 14; AST 26 05/03/2020: Magnesium 1.8 05/05/2020: BUN 20; Creatinine, Ser 1.63; Hemoglobin 8.7; Platelets 146; Potassium 3.7; Sodium 139     Component Value Date/Time   CHOL 145 03/01/2020 1008   TRIG 109 03/01/2020 1008   HDL 46 (L) 03/01/2020 1008   CHOLHDL 3.2 03/01/2020 1008   VLDL 20 04/08/2016 0910   LDLCALC 80 03/01/2020 1008    Other Studies Reviewed Today:  Echocardiogram 05/02/2020 IMPRESSIONS    1. Left ventricular ejection fraction, by estimation, is 60 to 65%. The  left ventricle has normal function. The left ventricle has no regional  wall motion abnormalities. Left ventricular diastolic parameters are  consistent with Grade I diastolic  dysfunction (impaired relaxation).  2. Right ventricular systolic function is normal. The right ventricular  size is normal.  3. Left atrial size was mildly dilated.  4. The mitral valve is abnormal. No evidence of mitral valve  regurgitation. No evidence of mitral stenosis. Moderate mitral annular  calcification.  5. The aortic valve is tricuspid. There is mild calcification of the  aortic valve. Aortic valve regurgitation is not visualized. Mild to  moderate aortic valve sclerosis/calcification is present, without any  evidence of aortic stenosis.  6. The inferior vena cava is normal in size with greater than 50%  respiratory variability, suggesting right atrial pressure of 3 mmHg.      CT angio chest per PE protocol 04/04/2020 IMPRESSION: 1. Limited study  demonstrating no central, lobar, or proximal segmental sized pulmonary embolism. Smaller distal segmental and subsegmental sized emboli cannot be entirely excluded. 2. Mild cardiomegaly with evidence of mild interstitial pulmonary edema in the lungs; imaging findings suggestive of congestive heart failure. 3. Irregular masslike area in the left chest wall concerning for potential recurrent breast neoplasm measuring approximately 4.5 x 3.1 cm. There also lytic lesions in T1 and T3 vertebral bodies as well as the manubrium. The possibility of metastatic  disease should be considered. Further evaluation with PET-CT should be considered. 4. Aortic atherosclerosis, in addition to left main and 3 vessel coronary artery disease.  Aortic Atherosclerosis (ICD10-I70.0).   Echocardiogram 10/27/2019  1. Left ventricular ejection fraction, by estimation, is 60 to 65%. The left ventricle has normal function. The left ventricle has no regional wall motion abnormalities. There is mild left ventricular hypertrophy. Left ventricular diastolic parameters are consistent with Grade I diastolic dysfunction (impaired relaxation). Elevated left atrial pressure. 2. Right ventricular systolic function is normal. The right ventricular size is normal. 3. Left atrial size was mildly dilated. 4. The mitral valve is normal in structure. Trivial mitral valve regurgitation. No evidence of mitral stenosis. 5. The aortic valve is tricuspid. There is mild calcification of the aortic valve. There is mild thickening of the aortic valve. Aortic valve regurgitation is not visualized. No aortic stenosis is present. 6. The inferior vena cava is normal in size with greater than 50% respiratory variability, suggesting right atrial pressure of 3 mmHg. Comparison(s): Echocardiogram done 04/27/15 showed an EF of 60-65%.   Echocardiogram 04/27/2015: Study Conclusions  - Left ventricle: The cavity size was normal. Wall thickness  was increased in a pattern of severe LVH. Systolic function was normal. The estimated ejection fraction was in the range of 60% to 65%. Wall motion was normal; there were no regional wall motion abnormalities. Doppler parameters are consistent with abnormal left ventricular relaxation (grade 1 diastolic dysfunction). Doppler parameters are consistent with high ventricular filling pressure. - Aortic valve: Mildly calcified annulus. Trileaflet; mildly calcified leaflets. - Mitral valve: Calcified annulus. There was trivial regurgitation. - Left atrium: The atrium was mildly dilated. - Right atrium: Central venous pressure (est): 3 mm Hg. - Tricuspid valve: There was trivial regurgitation. - Pulmonary arteries: PA peak pressure: 26 mm Hg (S). - Pericardium, extracardiac: There was no pericardial effusion.  Impressions:  - Severe LVH with LVEF 60-65%. Grade 1 diastolic dysfunction with increased LV filling pressures. Mild left atrial enlargement. MAC with trivial mitral regurgitation. Mildly sclerotic aortic valve. Trivial tricuspid regurgitation with PASP 26 mmHg mmHg.  Assessment and Plan:  1. Secondary cardiomyopathy ([place])   2. Essential hypertension, benign   3. CAD in native artery   4. Bilateral carotid artery disease, unspecified type ([place])   5. Acute deep vein thrombosis (DVT) of other vein of left upper extremity (H[facility])   6. SOB (shortness of breath)   7. Malignant neoplasm of left breast in female, estrogen receptor positive, unspecified site of breast ([place])    1. Secondary cardiomyopathy ([place]) Recent echocardiogram performed due to continuing dyspnea on 05/02/2020 demonstrated EF is 60 to 65%.  No WMA's.  G1 DD.  Mildly dilated LA.  Moderate mitral annular calcification.     2. Essential hypertension, benign Blood pressure today 114/68.   Her losartan/hydrochlorothiazide and hydralazine were stopped at discharge from recent hospital admission.   Continue amlodipine 10 mg daily.  Continue carvedilol 12.5 mg p.o. twice daily.  3. CAD in native artery No anginal or exertional symptoms or use of nitroglycerin.  Continue aspirin 81 mg daily.  Continue nitroglycerin 0.4 mg sublingual as needed.  4.  Carotid artery disease. Patient has bilateral carotid artery bruits. Previous carotid artery study 2018 heterogeneous plaque with shadowing bilaterally.  1 to 39% ICA stenosis bilaterally.  Greater than 50% stenosis of the right ECA.  Patent left endarterectomy site.  Normal subclavian arteries, bilaterally.  Patent vertebral arteries with antegrade flow.   She has no  neurological symptoms.  No CVA or TIA-like symptoms.  5. DVT left upper extremity Recent DVT of left upper extremity.  She states today the DVT in her left upper arm looks much better and she is continuing Eliquis without any GI bleeding noted after recent hospital admission for GI bleed.  6. Breast Cancer left breast / Recurrent Has a recurrence of left breast CA. per recent hospital notes she has recurrent breast CA with bone metastasis followed by oncology/hematology.  7.  Shortness of breath Recent echocardiogram performed due to continuing dyspnea on 05/02/2020 demonstrated EF is 60 to 65%.  No WMA's.  G1 DD.  Mildly dilated LA.  Moderate mitral annular calcification.  Of note, she recently had a hospitalization for significant anemia with initial hemoglobin of 7.2.  She had 1 unit of packed red blood cells transfused during admission and had a subsequent transfusion today.  Hemoglobin prior to infusion today was 8.7.  This may have been a significant factor in her shortness of breath last visit.  Currently denies any significant shortness of breath.  Medication Adjustments/Labs and Tests Ordered: Current medicines are reviewed at length with the patient today.  Concerns regarding medicines are outlined above.   Spoke to the patient 10 minutes during telemedicine  visit.   Disposition: Follow-up with [name] or APP 3 months.  Signed, Levell July, NP 05/07/2020 1:46 PM    [facility] at [place], [place], [place] [address] Phone: [phone]; Fax: [phone]

## 2020-05-07 ENCOUNTER — Telehealth: Payer: Self-pay

## 2020-05-07 ENCOUNTER — Telehealth (INDEPENDENT_AMBULATORY_CARE_PROVIDER_SITE_OTHER): Payer: Medicare PPO | Admitting: Family Medicine

## 2020-05-07 ENCOUNTER — Encounter: Payer: Self-pay | Admitting: Family Medicine

## 2020-05-07 VITALS — BP 114/68 | HR 61 | Resp 20

## 2020-05-07 DIAGNOSIS — I82622 Acute embolism and thrombosis of deep veins of left upper extremity: Secondary | ICD-10-CM

## 2020-05-07 DIAGNOSIS — C50912 Malignant neoplasm of unspecified site of left female breast: Secondary | ICD-10-CM

## 2020-05-07 DIAGNOSIS — I779 Disorder of arteries and arterioles, unspecified: Secondary | ICD-10-CM | POA: Diagnosis not present

## 2020-05-07 DIAGNOSIS — I2510 Atherosclerotic heart disease of native coronary artery without angina pectoris: Secondary | ICD-10-CM

## 2020-05-07 DIAGNOSIS — R0602 Shortness of breath: Secondary | ICD-10-CM

## 2020-05-07 DIAGNOSIS — I10 Essential (primary) hypertension: Secondary | ICD-10-CM

## 2020-05-07 DIAGNOSIS — I429 Cardiomyopathy, unspecified: Secondary | ICD-10-CM

## 2020-05-07 DIAGNOSIS — Z170 Estrogen receptor positive status [ER+]: Secondary | ICD-10-CM

## 2020-05-07 NOTE — Patient Instructions (Signed)
Your physician recommends that you schedule a follow-up appointment in: 3 MONTHS WITH DR. MCDOWELL  Your physician recommends that you continue on your current medications as directed. Please refer to the Current Medication list given to you today.  Thank you for choosing [facility]!!    

## 2020-05-07 NOTE — Telephone Encounter (Signed)
Transition Care Management Follow-up Telephone Call  Date of discharge and from where: 05/05/20 from [facility]  How have you been since you were released from the hospital? Pt feels much better.  Any questions or concerns? No  Items Reviewed:  Did the pt receive and understand the discharge instructions provided? Yes  Medications obtained and verified? Yes   Other? Yes   Any new allergies since your discharge? No   Dietary orders reviewed? Yes  Do you have support at home? Yes   Home Care and Equipment/Supplies: Were home health services ordered? no If so, what is the name of the agency? n/a  Has the agency set up a time to come to the patient's home? not applicable Were any new equipment or medical supplies ordered?  No What is the name of the medical supply agency? n/a Were you able to get the supplies/equipment? not applicable Do you have any questions related to the use of the equipment or supplies? No  Functional Questionnaire: (I = Independent and D = Dependent) ADLs: I  Bathing/Dressing- I  Meal Prep- I  Eating- I  Maintaining continence- I  Transferring/Ambulation- I  Managing Meds- I  Follow up appointments reviewed:   PCP Hospital f/u appt confirmed? Yes  Scheduled to see Posey Pronto on 05/17/20 @ 1:40 pm.  [facility] f/u appt confirmed? Not needed.  Are transportation arrangements needed? No   If their condition worsens, is the pt aware to call PCP or go to the Emergency Dept.? Yes  Was the patient provided with contact information for the PCP's office or ED? Yes  Was to pt encouraged to call back with questions or concerns? Yes

## 2020-05-08 ENCOUNTER — Ambulatory Visit (HOSPITAL_COMMUNITY): Payer: Medicare PPO | Admitting: Hematology

## 2020-05-08 ENCOUNTER — Encounter: Payer: Self-pay | Admitting: Internal Medicine

## 2020-05-15 ENCOUNTER — Inpatient Hospital Stay: Payer: Medicare PPO | Admitting: Internal Medicine

## 2020-05-15 ENCOUNTER — Other Ambulatory Visit: Payer: Self-pay

## 2020-05-15 ENCOUNTER — Encounter (HOSPITAL_COMMUNITY)
Admission: RE | Admit: 2020-05-15 | Discharge: 2020-05-15 | Disposition: A | Discharge status: Home / Self Care | Payer: Medicare PPO | Source: Ambulatory Visit | Attending: Hematology | Admitting: Hematology

## 2020-05-15 DIAGNOSIS — I82622 Acute embolism and thrombosis of deep veins of left upper extremity: Secondary | ICD-10-CM | POA: Diagnosis present

## 2020-05-15 DIAGNOSIS — C7951 Secondary malignant neoplasm of bone: Secondary | ICD-10-CM | POA: Diagnosis not present

## 2020-05-15 DIAGNOSIS — R918 Other nonspecific abnormal finding of lung field: Secondary | ICD-10-CM | POA: Insufficient documentation

## 2020-05-15 DIAGNOSIS — C50912 Malignant neoplasm of unspecified site of left female breast: Secondary | ICD-10-CM | POA: Insufficient documentation

## 2020-05-15 DIAGNOSIS — Z170 Estrogen receptor positive status [ER+]: Secondary | ICD-10-CM | POA: Insufficient documentation

## 2020-05-15 DIAGNOSIS — Z79899 Other long term (current) drug therapy: Secondary | ICD-10-CM | POA: Insufficient documentation

## 2020-05-15 DIAGNOSIS — R222 Localized swelling, mass and lump, trunk: Secondary | ICD-10-CM

## 2020-05-15 DIAGNOSIS — R590 Localized enlarged lymph nodes: Secondary | ICD-10-CM | POA: Diagnosis not present

## 2020-05-15 LAB — GLUCOSE, CAPILLARY: Glucose-Capillary: 101 mg/dL — ABNORMAL HIGH (ref 70–99)

## 2020-05-15 MED ORDER — FLUDEOXYGLUCOSE F - 18 (FDG) INJECTION
11.6000 | Freq: Once | INTRAVENOUS | Status: AC | PRN
  Administered 2020-05-15: 11.7 via INTRAVENOUS

## 2020-05-16 ENCOUNTER — Other Ambulatory Visit: Payer: Self-pay | Admitting: *Deleted

## 2020-05-16 ENCOUNTER — Telehealth: Payer: Self-pay

## 2020-05-16 NOTE — Telephone Encounter (Signed)
This medication was sent to to [facility] 05-06-20

## 2020-05-16 NOTE — Telephone Encounter (Signed)
Patient needing refill on Eliquis send to [facility] in [place] ph# [phone]

## 2020-05-17 ENCOUNTER — Other Ambulatory Visit: Payer: Self-pay

## 2020-05-17 ENCOUNTER — Encounter: Payer: Self-pay | Admitting: Internal Medicine

## 2020-05-17 ENCOUNTER — Inpatient Hospital Stay (HOSPITAL_COMMUNITY): Payer: Medicare PPO | Attending: Hematology | Admitting: Hematology

## 2020-05-17 ENCOUNTER — Telehealth (HOSPITAL_COMMUNITY): Payer: Self-pay | Admitting: Pharmacy Technician

## 2020-05-17 ENCOUNTER — Ambulatory Visit (INDEPENDENT_AMBULATORY_CARE_PROVIDER_SITE_OTHER): Payer: Medicare PPO | Admitting: Internal Medicine

## 2020-05-17 ENCOUNTER — Other Ambulatory Visit (HOSPITAL_COMMUNITY): Payer: Self-pay

## 2020-05-17 VITALS — BP 174/58 | HR 62 | Temp 96.9°F | Resp 20 | Wt 216.9 lb

## 2020-05-17 VITALS — BP 187/81 | HR 58 | Resp 18 | Ht 66.0 in | Wt 216.8 lb

## 2020-05-17 DIAGNOSIS — Z09 Encounter for follow-up examination after completed treatment for conditions other than malignant neoplasm: Secondary | ICD-10-CM

## 2020-05-17 DIAGNOSIS — Z86718 Personal history of other venous thrombosis and embolism: Secondary | ICD-10-CM | POA: Diagnosis not present

## 2020-05-17 DIAGNOSIS — Z170 Estrogen receptor positive status [ER+]: Secondary | ICD-10-CM | POA: Insufficient documentation

## 2020-05-17 DIAGNOSIS — Z79899 Other long term (current) drug therapy: Secondary | ICD-10-CM | POA: Diagnosis not present

## 2020-05-17 DIAGNOSIS — I82622 Acute embolism and thrombosis of deep veins of left upper extremity: Secondary | ICD-10-CM | POA: Diagnosis not present

## 2020-05-17 DIAGNOSIS — R634 Abnormal weight loss: Secondary | ICD-10-CM | POA: Diagnosis not present

## 2020-05-17 DIAGNOSIS — C7951 Secondary malignant neoplasm of bone: Secondary | ICD-10-CM | POA: Diagnosis not present

## 2020-05-17 DIAGNOSIS — C50912 Malignant neoplasm of unspecified site of left female breast: Secondary | ICD-10-CM

## 2020-05-17 DIAGNOSIS — D500 Iron deficiency anemia secondary to blood loss (chronic): Secondary | ICD-10-CM

## 2020-05-17 DIAGNOSIS — M549 Dorsalgia, unspecified: Secondary | ICD-10-CM | POA: Diagnosis not present

## 2020-05-17 DIAGNOSIS — M25511 Pain in right shoulder: Secondary | ICD-10-CM | POA: Insufficient documentation

## 2020-05-17 DIAGNOSIS — Z79811 Long term (current) use of aromatase inhibitors: Secondary | ICD-10-CM | POA: Insufficient documentation

## 2020-05-17 DIAGNOSIS — C792 Secondary malignant neoplasm of skin: Secondary | ICD-10-CM | POA: Diagnosis not present

## 2020-05-17 DIAGNOSIS — Z7982 Long term (current) use of aspirin: Secondary | ICD-10-CM | POA: Insufficient documentation

## 2020-05-17 DIAGNOSIS — I10 Essential (primary) hypertension: Secondary | ICD-10-CM

## 2020-05-17 MED ORDER — PALBOCICLIB 100 MG PO CAPS
100.0000 mg | ORAL_CAPSULE | Freq: Every day | ORAL | 1 refills | Status: DC
  Filled 2020-05-17: qty 21, 21d supply, fill #0

## 2020-05-17 MED ORDER — ANASTROZOLE 1 MG PO TABS: 1.0000 mg | ORAL_TABLET | Freq: Every day | ORAL | 3 refills | Status: DC

## 2020-05-17 NOTE — Patient Instructions (Addendum)
[facility] at [facility] Discharge Instructions  You were seen and examined today by Dr. Delton Coombes. Dr. Delton Coombes is a medical oncologist, meaning he specializes in the management of cancer diagnoses with medications. Dr. Delton Coombes discussed your past medical history, family history of cancer and the events that led to you being here today.  You have been diagnosed with Stage IV recurrent breast cancer. The breast cancer is now present in the tissue of your chest wall and in numerous bones. The bone involvement can account for some of the pain that you have been experiencing.  Dr. Delton Coombes has recommended that you begin a course of oral medication to control the cancer. Because the cancer has spread, it is not possible to cure the cancer, but it is possible to control it and prevent it from spreading further. Dr. Delton Coombes has recommended two medications known as Ibrance and Anastrazole. Anastrazole is taken daily and works similarly to the tamoxifen that you were once on. Leslee Home is taken for 21 days straight followed by 7 days off.  Dr. Delton Coombes has also discussed starting you on a shot to strengthen your bones, known as [name]. This injection requires you to take Calcium and Vitamin D supplements twice daily. These can be found over the counter. Please let your dentist know that you will be starting on Xgeva.  Dr. Delton Coombes would like to see you two weeks after you start the Ibrance. You will have lab work done on the day that you return to see Dr. Delton Coombes.   Thank you for choosing [facility] at [facility] to provide your oncology and hematology care.  To afford each patient quality time with our provider, please arrive at least 15 minutes before your scheduled appointment time.   If you have a lab appointment with the [facility] please come in thru the Main Entrance and check in at the main information desk.  You need to re-schedule  your appointment should you arrive 10 or more minutes late.  We strive to give you quality time with our providers, and arriving late affects you and other patients whose appointments are after yours.  Also, if you no show three or more times for appointments you may be dismissed from the clinic at the providers discretion.     Again, thank you for choosing [facility].  Our hope is that these requests will decrease the amount of time that you wait before being seen by our physicians.       _____________________________________________________________  Should you have questions after your visit to [facility], please contact our office at [phone] and follow the prompts.  Our office hours are 8:00 a.m. and 4:30 p.m. Monday - Friday.  Please note that voicemails left after 4:00 p.m. may not be returned until the following business day.  We are closed weekends and major holidays.  You do have access to a nurse 24-7, just call the main number to the clinic [phone] and do not press any options, hold on the line and a nurse will answer the phone.    For prescription refill requests, have your pharmacy contact our office and allow 72 hours.    Due to Covid, you will need to wear a mask upon entering the hospital. If you do not have a mask, a mask will be given to you at the Main Entrance upon arrival. For doctor visits, patients may have 1 support person age 50  or older with them. For treatment visits, patients can not have anyone with them due to social distancing guidelines and our immunocompromised population.

## 2020-05-17 NOTE — Assessment & Plan Note (Signed)
On Eliquis Swelling improving F/u with Heme/Onc.

## 2020-05-17 NOTE — Progress Notes (Signed)
[place] [address] [place] [address]   Patient Care Team: [name], MD as PCP - General (Internal Medicine) Satira Sark, MD as PCP - Cardiology (Cardiology) Brien Mates, RN as Oncology Nurse Navigator (Oncology)  SUMMARY OF ONCOLOGIC HISTORY: Oncology History   No history exists.    CHIEF COMPLIANT: Follow-up for recurrent left breast cancer with metastases to bones   INTERVAL HISTORY: [name] is a 80 y.o. female here today for follow up of her recurrent left breast cancer with metastases to bones. Her last visit was on 04/05/2020.   Today she is accompanied by her daughter and she reports feeling okay. She reports that she was taken off tamoxifen in 2015. She complains of having pain in her right anterior chest wall and right shoulder; she takes Tylenol which helps control her pain and she is able to function. She is taking Eliquis BID and denies having nosebleeds, hematochezia or hematuria. Her appetite is excellent and she denies having N/V/D. She reports that her legs get swollen, the left leg more than the right due to saphenous vein harvesting for her CVA which she had in 2000; she continues having residual weakness on her left side. She had a stent placed in her LAD in 2012. She denies having dental cavities and goes to see her dentist every 6 month.  She is staying active at home and is able to cook and do all of her chores. She is able to ambulate without any aids. Her brother had throat cancer.   REVIEW OF SYSTEMS:   Review of Systems  Constitutional: Positive for fatigue (50%) and unexpected weight change (lost 8 lbs in 1 month). Negative for appetite change.  HENT:   Negative for nosebleeds.   Cardiovascular: Positive for chest pain (R anterior chest wall pain) and leg swelling (feet & legs; L leg > R).  Gastrointestinal: Negative for blood in stool, diarrhea, nausea and vomiting.  Genitourinary: Negative for  hematuria.   Musculoskeletal: Positive for arthralgias (R shoulder pain).  Neurological: Positive for extremity weakness (residual L sided weakness post CVA).    I have reviewed the past medical history, past surgical history, social history and family history with the patient and they are unchanged from previous note.   ALLERGIES:   is allergic to codeine and tramadol.   MEDICATIONS:  Current Outpatient Medications  Medication Sig Dispense Refill  . acetaminophen (TYLENOL) 325 MG tablet Take 2 tablets (650 mg total) by mouth every 6 (six) hours as needed for mild pain, fever or headache (or Fever >/= 101). 12 tablet 0  . amLODipine (NORVASC) 10 MG tablet Take 1 tablet (10 mg total) by mouth daily. 90 tablet 3  . anastrozole (ARIMIDEX) 1 MG tablet Take 1 tablet (1 mg total) by mouth daily. 30 tablet 3  . apixaban (ELIQUIS) 5 MG TABS tablet Take 1 tablet (5 mg total) by mouth 2 (two) times daily. Start after you complete the initial starter pack of Eliquis 60 tablet 5  . aspirin 81 MG tablet Take 1 tablet (81 mg total) by mouth daily with breakfast. 30 tablet 3  . carvedilol (COREG) 12.5 MG tablet Take 1 tablet (12.5 mg total) by mouth 2 (two) times daily with a meal. 90 tablet 3  . Cholecalciferol (VITAMIN D3) 125 MCG (5000 UT) CAPS TAKE 1 CAPSULE BY MOUTH ONCE DAILY (Patient taking differently: Take 1 capsule by mouth daily.) 90 capsule 1  . ferrous  sulfate 325 (65 FE) MG tablet Take 1 tablet (325 mg total) by mouth daily with breakfast. 30 tablet 0  . furosemide (LASIX) 20 MG tablet Take 1 tablet (20 mg total) by mouth daily. 90 tablet 1  . gabapentin (NEURONTIN) 100 MG capsule Take 1 capsule (100 mg total) by mouth 3 (three) times daily. 90 capsule 2  . HYDROcodone-acetaminophen (NORCO/VICODIN) 5-325 MG tablet Take 1 tablet by mouth every 12 (twelve) hours as needed for moderate pain. 20 tablet 0  . isosorbide mononitrate (IMDUR) 30 MG 24 hr tablet Take 1 tablet (30 mg total) by mouth  daily. 90 tablet 2  . levothyroxine (SYNTHROID) 137 MCG tablet Take 1 tablet (137 mcg total) by mouth daily before breakfast. 90 tablet 2  . nitroGLYCERIN (NITROSTAT) 0.4 MG SL tablet Place 1 tablet (0.4 mg total) under the tongue every 5 (five) minutes x 3 doses as needed. 25 tablet 3  . Omega-3 Fatty Acids 300 MG CAPS Take 1 capsule (300 mg total) by mouth 2 (two) times daily.    . palbociclib (IBRANCE) 100 MG capsule Take 1 capsule (100 mg total) by mouth daily with breakfast. Take whole with food. Take for 21 days on, 7 days off, repeat every 28 days. 21 capsule 1  . pantoprazole (PROTONIX) 40 MG tablet Take 1 tablet (40 mg total) by mouth daily. 30 tablet 3  . potassium chloride (KLOR-CON) 10 MEQ tablet Take 1 tablet (10 mEq total) by mouth daily. Take While taking Lasix/furosemide 30 tablet 2  . rosuvastatin (CRESTOR) 40 MG tablet Take 1 tablet (40 mg total) by mouth daily. 90 tablet 2  . senna-docusate (SENOKOT-S) 8.6-50 MG tablet Take 2 tablets by mouth at bedtime. 60 tablet 1   No current facility-administered medications for this visit.     PHYSICAL EXAMINATION: Performance status (ECOG): 2 - Symptomatic, <50% confined to bed  Vitals:   05/17/20 1025  BP: (!) 174/58  Pulse: 62  Resp: 20  Temp: (!) 96.9 F (36.1 C)  SpO2: 97%   Wt Readings from Last 3 Encounters:  05/17/20 216 lb 14.9 oz (98.4 kg)  05/01/20 224 lb (101.6 kg)  04/12/20 224 lb 9.6 oz (101.9 kg)   Physical Exam Vitals reviewed.  Constitutional:      Appearance: Normal appearance. She is obese.  Cardiovascular:     Rate and Rhythm: Normal rate and regular rhythm.     Pulses: Normal pulses.     Heart sounds: Normal heart sounds.  Pulmonary:     Effort: Pulmonary effort is normal.     Breath sounds: Normal breath sounds.  Chest:  Breasts:     Right: Absent. No mass, skin change or tenderness.     Left: Absent. No mass, skin change or tenderness.    Abdominal:     Palpations: Abdomen is soft. There  is no mass.     Tenderness: There is no abdominal tenderness.  Musculoskeletal:     Cervical back: Bony tenderness (R anterior scapula TTP) present.     Right lower leg: Edema (1+) present.     Left lower leg: Edema (2+) present.  Neurological:     General: No focal deficit present.     Mental Status: She is alert and oriented to person, place, and time.  Psychiatric:        Mood and Affect: Mood normal.        Behavior: Behavior normal.     Breast Exam Chaperone: Milinda Antis, MD  LABORATORY DATA:  I have reviewed the data as listed CMP Latest Ref Rng & Units 05/05/2020 05/04/2020 05/03/2020  Glucose 70 - 99 mg/dL 131(H) 100(H) 86  BUN 8 - 23 mg/dL 20 19 26(H)  Creatinine 0.44 - 1.00 mg/dL 1.63(H) 1.49(H) 1.49(H)  Sodium 135 - 145 mmol/L 139 139 140  Potassium 3.5 - 5.1 mmol/L 3.7 3.4(L) 4.1  Chloride 98 - 111 mmol/L 109 107 108  CO2 22 - 32 mmol/L 22 22 21(L)  Calcium 8.9 - 10.3 mg/dL 9.5 9.3 9.5  Total Protein 6.5 - 8.1 g/dL - - -  Total Bilirubin 0.3 - 1.2 mg/dL - - -  Alkaline Phos 38 - 126 U/L - - -  AST 15 - 41 U/L - - -  ALT 0 - 44 U/L - - -   No results found for: QHU765 Lab Results  Component Value Date   WBC 7.8 05/05/2020   HGB 8.7 (L) 05/05/2020   HCT 28.2 (L) 05/05/2020   MCV 85.2 05/05/2020   PLT 146 (L) 05/05/2020   NEUTROABS 6.1 05/01/2020   Surgical pathology ([id number]) on 04/05/2020: Chest wall needle core biopsy: recurrent breast carcinoma, ER/PR positive, HER-2 negative.  ASSESSMENT:  1.  Recurrent breast carcinoma with metastases to bones: -History of right breast cancer in 1987, status post right mastectomy requiring no adjuvant chemo or XRT.  Was not on tamoxifen. -History of left breast cancer in 2007, status post mastectomy by Dr. Carlis Abbott at [facility], placed on tamoxifen, discontinued around 2015. -Recent upper back pain, weight loss of 20 pounds in the last 3 to 6 months. -CT angio on 04/04/2020 showed irregular mass  in the left chest wall concerning for recurrent breast cancer measuring 4.5 x 3.1 cm. -Also showed lytic lesions in T1 and T3 vertebral bodies as well as manubrium. -Left chest wall mass biopsy on 04/05/2020 consistent with adenocarcinoma compatible with recurrent breast carcinoma.  ER 95% positive, PR 40% positive, HER2 2+, negative by FISH. - PET scan on 05/15/2020 shows hypermetabolic mass in the left anterior chest wall, mild hypermetabolic thoracic lymphadenopathy, diffuse bone metastasis throughout the spine, sternum, both shoulders, pelvis, proximal right femur which are both lytic and sclerotic.  2.  Left upper extremity DVT: -Left upper extremity Doppler on 04/04/2020 + for DVT involving left internal jugular vein, left subclavian vein, left axillary vein, left brachial and left basilic vein.  3.  Family history: - No major family history of malignancies other than her brother with throat cancer.   PLAN:  1.  Metastatic breast cancer to the bones, ER/PR positive: - She is seen with her daughter today.  She is independent of all ADLs and most IADLs. - We talked about biopsy results in detail. - We talked about prognosis of metastatic ER positive breast cancer with survival measured in years. - We talked about first-line therapy with combination of remedies inhibitor and CDK 4/6 inhibitor. - We will start her on palbociclib and anastrozole. - We discussed the side effects of palbociclib and anastrozole in detail. - We will start her on palbociclib 100 mg 3 weeks on 1 week off.  We will titrate up to full dose if tolerated well. - She will come back in 2 weeks after starting palbociclib.  Both prescriptions were sent to her pharmacy.  2.  Left upper extremity DVT: - Continue Eliquis.  No bleeding issues reported.  3.  Right chest wall and scapular pain: - She is taking Tylenol for the pain which  is controlling it well. - If it is poorly controlled will consider tramadol.  4.  Bone  metastasis: - We talked about initiating her on denosumab to decrease skeletal related events. - We talked about side effects including hypocalcemia and rare chance of osteonecrosis of the jaw.  She has teeth in her lower jaw but has regular dental follow-ups. - We will likely initiate denosumab at next visit.  She was told to start taking calcium and vitamin D.   Orders placed this encounter:  Orders Placed This Encounter  Procedures  . CBC with Differential  . Comprehensive metabolic panel  . Cancer antigen 27.29  . Cancer antigen 15-3  . Magnesium    The patient has a good understanding of the overall plan. She agrees with it. She will call with any problems that may develop before the next visit here. Total time spent is 40 minutes with more than 50% of the time spent face-to-face discussing new diagnosis, prognosis, treatment plan, counseling and coordination of care.  Derek Jack, MD [place] [phone]   I, Milinda Antis, am acting as a scribe for Dr. Sanda Linger.  I, Derek Jack MD, have reviewed the above documentation for accuracy and completeness, and I agree with the above.

## 2020-05-17 NOTE — Telephone Encounter (Signed)
Oral Oncology Patient Advocate Encounter  Received notification from [facility] that prior authorization for Robin [name] is required.  PA submitted on CoverMyMeds Key [id number] Status is pending  Oral Oncology Clinic will continue to follow.  [name] [name] Phone [phone] Fax [phone] 05/18/2020 9:15 AM

## 2020-05-17 NOTE — Progress Notes (Signed)
Established Patient Office Visit  Subjective:  Patient ID: Robin [name], female    DOB: 08-Sep-1940  Age: 80 y.o. MRN: [id number]  CC:  Chief Complaint  Patient presents with  . Transitions Of Care    Pt was discharged 05-05-20 from [facility] was having chest pains they also found breast cancer while there she is still having chest pain they come and go they are sharp     HPI Robin [name] is a 80 year old female with PMH of HTN, CAD s/p stent placement, carotid artery occlusion s/p carotid endarterectomy, hypothyroidism, generalized OA and GERD who presents for follow up after being discharged from hospital.  She was admitted to the hospital with c/o atypical chest pain on 04/12.  CT chest PE protocol was ordered to rule out PE, which revealed chest wall mass concerning for recurrent breast CA.  She also reported dark stools initially, for which EGD and colonoscopy were performed, which did not show any active source of bleeding.  Her Eliquis for DVT was restarted.  She is taking pantoprazole and ferrous sulfate currently.  She was discharged from the hospital on 04/16.  She had oncology evaluation today for recurrent breast CA.  She is started on anastrozole and Ibrance.  She is going to get follow-up blood test in the next visit with oncology after 2 weeks.  She is also going to start Xgeva for bone health/osteoporosis.  She complains of intermittent chest pain, around 8 out of 10, dull and nonradiating.  She takes Tylenol as needed.  She has Norco at home, but she does not like to take it as it makes her drowsy.  Her blood pressure was elevated in the office today.  Of note, she has not been able to take her blood pressure medications this morning as she had 2 medical appointments.  Her Hyzaar and hydralazine were discontinued from the hospital recently as her blood pressure was borderline low during the hospital course.  She states that her blood pressure has been around 130s/70s at  home.  She denies any headache, dizziness or palpitations.  Past Medical History:  Diagnosis Date  . Breast cancer ([place])    Tamoxifen started 2007  . CAD (coronary artery disease)    DES second diagonal 2/12  . Cardiomyopathy    Probable Takotsubo, LVEF 30-35% 2/12  . Carotid artery disease ([place])   . Essential hypertension   . Hepatic steatosis   . Hyperlipidemia   . Hypothyroidism   . NSTEMI (non-ST elevated myocardial infarction) ([place])    2/12  . Pancreas divisum   . Stroke ([place])    (2000) residual left-sided weakness  . Type 2 diabetes mellitus ([place])     Past Surgical History:  Procedure Laterality Date  . ABDOMINAL HYSTERECTOMY    . CAROTID ENDARTERECTOMY     Left  . CHOLECYSTECTOMY    . COLONOSCOPY WITH PROPOFOL N/A 05/03/2020   Procedure: COLONOSCOPY WITH PROPOFOL;  Surgeon: Daneil Dolin, MD;  [facility];  Service: Endoscopy;  Laterality: N/A;  . ESOPHAGOGASTRODUODENOSCOPY (EGD) WITH PROPOFOL N/A 05/02/2020   Procedure: ESOPHAGOGASTRODUODENOSCOPY (EGD) WITH PROPOFOL;  Surgeon: Rogene Houston, MD;  Location: [facility];  Service: Endoscopy;  Laterality: N/A;  . GIVENS CAPSULE STUDY N/A 05/04/2020   Procedure: GIVENS CAPSULE STUDY;  Surgeon: Harvel Quale, MD;  Location: [facility];  Service: Gastroenterology;  Laterality: N/A;  . JOINT REPLACEMENT     Right knee replacement- [facility]. Sebastian [facility]  .  MASTECTOMY     Bilateral  . POLYPECTOMY  05/03/2020   Procedure: POLYPECTOMY;  Surgeon: Daneil Dolin, MD;  Location: [facility];  Service: Endoscopy;;    Family History  Problem Relation Age of Onset  . Heart attack Sister   . Heart disease Sister   . Heart attack Brother   . Heart disease Brother   . Throat cancer Brother   . Colon cancer Neg Hx     Social History   Socioeconomic History  . Marital status: Widowed    Spouse name: Not on file  . Number of children: Not on file  . Years of education: Not on file  .  Highest education level: Not on file  Occupational History  . Not on file  Tobacco Use  . Smoking status: Never Smoker  . Smokeless tobacco: Never Used  Vaping Use  . Vaping Use: Never used  Substance and Sexual Activity  . Alcohol use: No    Alcohol/week: 0.0 standard drinks  . Drug use: No  . Sexual activity: Not on file  Other Topics Concern  . Not on file  Social History Narrative  . Not on file   Social Determinants of Health   Financial Resource Strain: Not on file  Food Insecurity: Not on file  Transportation Needs: Not on file  Physical Activity: Not on file  Stress: Not on file  Social Connections: Not on file  Intimate Partner Violence: Not on file    Outpatient Medications Prior to Visit  Medication Sig Dispense Refill  . acetaminophen (TYLENOL) 325 MG tablet Take 2 tablets (650 mg total) by mouth every 6 (six) hours as needed for mild pain, fever or headache (or Fever >/= 101). 12 tablet 0  . amLODipine (NORVASC) 10 MG tablet Take 1 tablet (10 mg total) by mouth daily. 90 tablet 3  . anastrozole (ARIMIDEX) 1 MG tablet Take 1 tablet (1 mg total) by mouth daily. 30 tablet 3  . apixaban (ELIQUIS) 5 MG TABS tablet Take 1 tablet (5 mg total) by mouth 2 (two) times daily. Start after you complete the initial starter pack of Eliquis 60 tablet 5  . aspirin 81 MG tablet Take 1 tablet (81 mg total) by mouth daily with breakfast. 30 tablet 3  . carvedilol (COREG) 12.5 MG tablet Take 1 tablet (12.5 mg total) by mouth 2 (two) times daily with a meal. 90 tablet 3  . Cholecalciferol (VITAMIN D3) 125 MCG (5000 UT) CAPS TAKE 1 CAPSULE BY MOUTH ONCE DAILY (Patient taking differently: Take 1 capsule by mouth daily.) 90 capsule 1  . ferrous sulfate 325 (65 FE) MG tablet Take 1 tablet (325 mg total) by mouth daily with breakfast. 30 tablet 0  . furosemide (LASIX) 20 MG tablet Take 1 tablet (20 mg total) by mouth daily. 90 tablet 1  . gabapentin (NEURONTIN) 100 MG capsule Take 1 capsule  (100 mg total) by mouth 3 (three) times daily. 90 capsule 2  . HYDROcodone-acetaminophen (NORCO/VICODIN) 5-325 MG tablet Take 1 tablet by mouth every 12 (twelve) hours as needed for moderate pain. 20 tablet 0  . isosorbide mononitrate (IMDUR) 30 MG 24 hr tablet Take 1 tablet (30 mg total) by mouth daily. 90 tablet 2  . levothyroxine (SYNTHROID) 137 MCG tablet Take 1 tablet (137 mcg total) by mouth daily before breakfast. 90 tablet 2  . nitroGLYCERIN (NITROSTAT) 0.4 MG SL tablet Place 1 tablet (0.4 mg total) under the tongue every 5 (five) minutes x 3  doses as needed. 25 tablet 3  . Omega-3 Fatty Acids 300 MG CAPS Take 1 capsule (300 mg total) by mouth 2 (two) times daily.    . palbociclib (IBRANCE) 100 MG capsule Take 1 capsule (100 mg total) by mouth daily with breakfast. Take whole with food. Take for 21 days on, 7 days off, repeat every 28 days. 21 capsule 1  . pantoprazole (PROTONIX) 40 MG tablet Take 1 tablet (40 mg total) by mouth daily. 30 tablet 3  . potassium chloride (KLOR-CON) 10 MEQ tablet Take 1 tablet (10 mEq total) by mouth daily. Take While taking Lasix/furosemide 30 tablet 2  . rosuvastatin (CRESTOR) 40 MG tablet Take 1 tablet (40 mg total) by mouth daily. 90 tablet 2  . senna-docusate (SENOKOT-S) 8.6-50 MG tablet Take 2 tablets by mouth at bedtime. 60 tablet 1   No facility-administered medications prior to visit.    Allergies  Allergen Reactions  . Codeine Nausea And Vomiting  . Tramadol     ROS Review of Systems  Constitutional: Positive for fatigue. Negative for chills and fever.  HENT: Negative for congestion, sinus pressure, sinus pain and sore throat.   Eyes: Negative for pain and discharge.  Respiratory: Negative for cough and shortness of breath.   Cardiovascular: Positive for chest pain and leg swelling. Negative for palpitations.  Gastrointestinal: Negative for abdominal pain, constipation, diarrhea, nausea and vomiting.  Endocrine: Negative for polydipsia and  polyuria.  Genitourinary: Negative for dysuria and hematuria.  Musculoskeletal: Negative for neck pain and neck stiffness.  Skin: Negative for rash.  Neurological: Negative for dizziness and weakness.  Psychiatric/Behavioral: Negative for agitation and behavioral problems.      Objective:    Physical Exam Vitals reviewed.  Constitutional:      General: She is not in acute distress.    Appearance: She is not diaphoretic.     Comments: In wheelchair  HENT:     Head: Normocephalic and atraumatic.     Nose: Nose normal.     Mouth/Throat:     Mouth: Mucous membranes are moist.  Eyes:     General: No scleral icterus.    Extraocular Movements: Extraocular movements intact.     Pupils: Pupils are equal, round, and reactive to light.  Cardiovascular:     Rate and Rhythm: Normal rate and regular rhythm.     Pulses: Normal pulses.     Heart sounds: Normal heart sounds. No murmur heard.   Pulmonary:     Breath sounds: Normal breath sounds. No wheezing or rales.  Musculoskeletal:     Cervical back: Neck supple. No tenderness.     Right lower leg: Edema present.     Left lower leg: Edema present.     Comments: Left UE swelling  Skin:    General: Skin is warm.     Findings: No rash.  Neurological:     General: No focal deficit present.     Mental Status: She is alert and oriented to person, place, and time.  Psychiatric:        Mood and Affect: Mood normal.        Behavior: Behavior normal.     BP (!) 187/81 (BP Location: Right Arm, Patient Position: Sitting, Cuff Size: Normal) Comment: didnt take meds this am  Pulse (!) 58   Resp 18   Ht _0  (1.676 m)   Wt 216 lb 12.8 oz (98.3 kg)   SpO2 100%   BMI 34.99 kg/m  Wt Readings  from Last 3 Encounters:  05/17/20 216 lb 12.8 oz (98.3 kg)  05/17/20 216 lb 14.9 oz (98.4 kg)  05/01/20 224 lb (101.6 kg)     Health Maintenance Due  Topic Date Due  . COVID-19 Vaccine (3 - Moderna risk 4-dose series) 08/05/2019    There  are no preventive care reminders to display for this patient.  Lab Results  Component Value Date   TSH 10.600 (H) 03/29/2020   Lab Results  Component Value Date   WBC 7.8 05/05/2020   HGB 8.7 (L) 05/05/2020   HCT 28.2 (L) 05/05/2020   MCV 85.2 05/05/2020   PLT 146 (L) 05/05/2020   Lab Results  Component Value Date   NA 139 05/05/2020   K 3.7 05/05/2020   CO2 22 05/05/2020   GLUCOSE 131 (H) 05/05/2020   BUN 20 05/05/2020   CREATININE 1.63 (H) 05/05/2020   BILITOT 0.6 05/01/2020   ALKPHOS 128 (H) 05/01/2020   AST 26 05/01/2020   ALT 14 05/01/2020   PROT 7.6 05/01/2020   ALBUMIN 3.4 (L) 05/03/2020   CALCIUM 9.5 05/05/2020   ANIONGAP 8 05/05/2020   EGFR 31 (L) 04/11/2020   Lab Results  Component Value Date   CHOL 145 03/01/2020   Lab Results  Component Value Date   HDL 46 (L) 03/01/2020   Lab Results  Component Value Date   LDLCALC 80 03/01/2020   Lab Results  Component Value Date   TRIG 109 03/01/2020   Lab Results  Component Value Date   CHOLHDL 3.2 03/01/2020   Lab Results  Component Value Date   HGBA1C 5.9 (H) 03/01/2020      Assessment & Plan:   Problem List Items Addressed This Visit      Hospital discharge follow-up    -  [facility] chart reviewed including imaging CT chest showed recurrent breast ca with metastasis Had Oncology evaluation today Medications reconciled  Cardiovascular and Mediastinum   Essential hypertension, benign BP Readings from Last 1 Encounters:  05/17/20 (!) 187/81   Elevated, but she has not had her medications as she had 2 medical appointments since morning - advised to contact with home BP readings after 1 week On Amlodipine and Coreg Hyzaar and Hydralazine discontinued recently from hospital admission Counseled for compliance with the medications Low salt diet and ambulate as tolerated               Acute DVT (deep venous thrombosis) --LtUE---03/2020    On Eliquis Swelling improving F/u with  Heme/Onc.        Other   Anemia    Had GI blood loss recently Hgb ~ 8, stable in last few CBC No melena or hematochezia currently F/u Heme/Onc.      Recurrent malignant neoplasm of left breast ([place])    CT chest showed recurrence of breast ca. On Anastrozole and Ibrance, started today F/u with Heme/Onc.            No orders of the defined types were placed in this encounter.   Follow-up: Return in about 2 months (around 07/17/2020).    [name], MD

## 2020-05-17 NOTE — Patient Instructions (Signed)
Please continue taking medications as prescribed.  Please start taking new medications prescribed by Dr Delton Coombes and follow up as scheduled.  Please contact us with the blood pressure readings at home after 1 week. If you notice BP > 150/90 on 3 consecutive measurements, please contact us.

## 2020-05-17 NOTE — Assessment & Plan Note (Signed)
Had GI blood loss recently Hgb ~ 8, stable in last few CBC No melena or hematochezia currently F/u Heme/Onc.

## 2020-05-17 NOTE — Assessment & Plan Note (Addendum)
BP Readings from Last 1 Encounters:  05/17/20 (!) 187/81   Elevated, but she has not had her medications as she had 2 medical appointments since morning - advised to contact with home BP readings after 1 week On Amlodipine and Coreg Hyzaar and Hydralazine discontinued recently from hospital admission Counseled for compliance with the medications Low salt diet and ambulate as tolerated

## 2020-05-17 NOTE — Assessment & Plan Note (Signed)
CT chest showed recurrence of breast ca. On Anastrozole and Ibrance, started today F/u with Heme/Onc.

## 2020-05-18 ENCOUNTER — Telehealth (HOSPITAL_COMMUNITY): Payer: Self-pay | Admitting: Pharmacy Technician

## 2020-05-18 ENCOUNTER — Other Ambulatory Visit (HOSPITAL_COMMUNITY): Payer: Self-pay

## 2020-05-18 NOTE — Telephone Encounter (Signed)
Oral Oncology Patient Advocate Encounter  Was successful in securing patient a $15,000 grant from [employer] to provide copayment coverage for [place].  This will keep the out of pocket expense at $0.     [employer] ID: [id number]  I have spoken with the patient.   The billing information is as follows and has been shared with [place].    RxBin: [id number] PCN: [id number] Member ID: [id number] Group ID: [id number] Dates of Eligibility: 04/18/20 through 04/17/21  Fund:  [facility] Patient [place] Phone [phone] Fax [phone] 05/18/2020 11:24 AM

## 2020-05-18 NOTE — Telephone Encounter (Signed)
Oral Oncology Patient Advocate Encounter  Prior Authorization for Robin [name] has been approved.    PA# [id number] Effective dates: 05/18/20 through 11/14/20  Patients co-pay is $2929.67  Oral Oncology Clinic will continue to follow.   [name] Patient [name] Phone [phone] Fax [phone] 05/18/2020 9:18 AM

## 2020-05-21 ENCOUNTER — Telehealth (HOSPITAL_COMMUNITY): Payer: Self-pay | Admitting: Pharmacist

## 2020-05-21 ENCOUNTER — Other Ambulatory Visit (HOSPITAL_COMMUNITY): Payer: Self-pay

## 2020-05-21 DIAGNOSIS — C50912 Malignant neoplasm of unspecified site of left female breast: Secondary | ICD-10-CM

## 2020-05-21 MED ORDER — PALBOCICLIB 100 MG PO TABS
100.0000 mg | ORAL_TABLET | Freq: Every day | ORAL | 0 refills | Status: DC
  Filled 2020-05-21: qty 21, 21d supply, fill #0

## 2020-05-21 NOTE — Telephone Encounter (Signed)
Oral Oncology Pharmacist Encounter  Received new prescription for Ibrance (palbociclib) for the treatment of recurrent metastatic breast cancer, ER/PR positive, HER2 negative in conjunction with anastrozole, planned duration until disease progression or unacceptable drug toxicity.  CMP from 05/01/20 assessed, no relevant lab abnormalities. Prescription dose and frequency assessed. MD plans on starting her at $Remo'100mg'vWVxy$  and titrating up to full dose if tolerated.  Current medication list in Epic reviewed, no relevant DDIs with palbociclib identified.  Evaluated chart and no patient barriers to medication adherence identified.   Prescription has been e-scribed to the [facility] for benefits analysis and approval.  Oral Oncology Clinic will continue to follow for insurance authorization, copayment issues, initial counseling and start date.  Patient agreed to treatment on 05/17/20 per MD documentation.  Darl Pikes, PharmD, BCPS, BCOP, CPP Hematology/Oncology Clinical Pharmacist Practitioner [facility] [place] [facility] [phone]  05/21/2020 12:54 PM

## 2020-05-30 NOTE — Telephone Encounter (Signed)
Spoke to patients daughter on 05/28/20.  Patient would like to speak with Dr Delton Coombes about medication before having it shipped.  Informed Lilia Pro, RN for Dr Delton Coombes.   [place] Patient [place] Phone [phone] Fax [phone] 05/30/2020 10:08 AM

## 2020-05-31 ENCOUNTER — Ambulatory Visit (HOSPITAL_COMMUNITY): Payer: Medicare PPO | Admitting: Hematology

## 2020-06-06 ENCOUNTER — Encounter: Payer: Self-pay | Admitting: Internal Medicine

## 2020-06-06 ENCOUNTER — Telehealth (INDEPENDENT_AMBULATORY_CARE_PROVIDER_SITE_OTHER): Payer: Medicare PPO | Admitting: Internal Medicine

## 2020-06-06 ENCOUNTER — Other Ambulatory Visit: Payer: Self-pay

## 2020-06-06 DIAGNOSIS — U071 COVID-19: Secondary | ICD-10-CM | POA: Diagnosis not present

## 2020-06-06 MED ORDER — PROMETHAZINE-DM 6.25-15 MG/5ML PO SYRP
5.0000 mL | ORAL_SOLUTION | Freq: Four times a day (QID) | ORAL | 0 refills | Status: DC | PRN
Start: 2020-06-06 — End: 2020-08-09

## 2020-06-06 NOTE — Progress Notes (Signed)
Virtual Visit via Telephone Note   This visit type was conducted due to national recommendations for restrictions regarding the COVID-19 Pandemic (e.g. social distancing) in an effort to limit this patient's exposure and mitigate transmission in our community.  Due to her co-morbid illnesses, this patient is at least at moderate risk for complications without adequate follow up.  This format is felt to be most appropriate for this patient at this time.  The patient did not have access to video technology/had technical difficulties with video requiring transitioning to audio format only (telephone).  All issues noted in this document were discussed and addressed.  No physical exam could be performed with this format.  Evaluation Performed:  Follow-up visit  Date:  06/06/2020   ID:  Robin [name], DOB 04-01-1940, MRN [id number]  Patient Location: Home Provider Location: Office/Clinic  Participants: Patient Location of Patient: Home Location of Provider: Telehealth Consent was obtain for visit to be over via telehealth. I verified that I am speaking with the correct person using two identifiers.  PCP:  [name], MD   Chief Complaint:  Cough and nasal congestion  History of Present Illness:    Robin [name] is a 80 y.o. female who has a televisit for c/o cough and nasal congestion for about 10 days. She tested positive for COVID 5 days ago. She had fever initially, which is resolved now. She denies any dyspnea or wheezing. She would prefer to take cough syrup instead of tablets.  The patient does have symptoms concerning for COVID-19 infection (fever, chills, cough, or new shortness of breath).   Past Medical, Surgical, Social History, Allergies, and Medications have been Reviewed.  Past Medical History:  Diagnosis Date  . Breast cancer ([place])    Tamoxifen started 2007  . CAD (coronary artery disease)    DES second diagonal 2/12  . Cardiomyopathy    Probable  Takotsubo, LVEF 30-35% 2/12  . Carotid artery disease ([place])   . Essential hypertension   . Hepatic steatosis   . Hyperlipidemia   . Hypothyroidism   . NSTEMI (non-ST elevated myocardial infarction) ([place])    2/12  . Pancreas divisum   . Stroke ([place])    (2000) residual left-sided weakness  . Type 2 diabetes mellitus ([place])    Past Surgical History:  Procedure Laterality Date  . ABDOMINAL HYSTERECTOMY    . CAROTID ENDARTERECTOMY     Left  . CHOLECYSTECTOMY    . COLONOSCOPY WITH PROPOFOL N/A 05/03/2020   Procedure: COLONOSCOPY WITH PROPOFOL;  Surgeon: Daneil Dolin, MD;  [facility];  Service: Endoscopy;  Laterality: N/A;  . ESOPHAGOGASTRODUODENOSCOPY (EGD) WITH PROPOFOL N/A 05/02/2020   Procedure: ESOPHAGOGASTRODUODENOSCOPY (EGD) WITH PROPOFOL;  Surgeon: Rogene Houston, MD;  [facility];  Service: Endoscopy;  Laterality: N/A;  . GIVENS CAPSULE STUDY N/A 05/04/2020   Procedure: GIVENS CAPSULE STUDY;  Surgeon: Harvel Quale, MD;  Location: [facility];  Service: Gastroenterology;  Laterality: N/A;  . JOINT REPLACEMENT     [facility]  . MASTECTOMY     Bilateral  . POLYPECTOMY  05/03/2020   Procedure: POLYPECTOMY;  Surgeon: Daneil Dolin, MD;  [facility]: [facility];  Service: Endoscopy;;     Current Meds  Medication Sig  . acetaminophen (TYLENOL) 325 MG tablet Take 2 tablets (650 mg total) by mouth every 6 (six) hours as needed for mild pain, fever or headache (or Fever >/=  101).  . amLODipine (NORVASC) 10 MG tablet Take 1 tablet (10 mg total) by mouth daily.  Marland Kitchen anastrozole (ARIMIDEX) 1 MG tablet Take 1 tablet (1 mg total) by mouth daily.  Marland Kitchen apixaban (ELIQUIS) 5 MG TABS tablet Take 1 tablet (5 mg total) by mouth 2 (two) times daily. Start after you complete the initial starter pack of Eliquis  . aspirin 81 MG tablet Take 1 tablet (81 mg total) by mouth daily with breakfast.  . carvedilol (COREG) 12.5 MG  tablet Take 1 tablet (12.5 mg total) by mouth 2 (two) times daily with a meal.  . Cholecalciferol (VITAMIN D3) 125 MCG (5000 UT) CAPS TAKE 1 CAPSULE BY MOUTH ONCE DAILY (Patient taking differently: Take 1 capsule by mouth daily.)  . ferrous sulfate 325 (65 FE) MG tablet Take 1 tablet (325 mg total) by mouth daily with breakfast.  . furosemide (LASIX) 20 MG tablet Take 1 tablet (20 mg total) by mouth daily.  Marland Kitchen gabapentin (NEURONTIN) 100 MG capsule Take 1 capsule (100 mg total) by mouth 3 (three) times daily.  Marland Kitchen HYDROcodone-acetaminophen (NORCO/VICODIN) 5-325 MG tablet Take 1 tablet by mouth every 12 (twelve) hours as needed for moderate pain.  . isosorbide mononitrate (IMDUR) 30 MG 24 hr tablet Take 1 tablet (30 mg total) by mouth daily.  Marland Kitchen levothyroxine (SYNTHROID) 137 MCG tablet Take 1 tablet (137 mcg total) by mouth daily before breakfast.  . nitroGLYCERIN (NITROSTAT) 0.4 MG SL tablet Place 1 tablet (0.4 mg total) under the tongue every 5 (five) minutes x 3 doses as needed.  . Omega-3 Fatty Acids 300 MG CAPS Take 1 capsule (300 mg total) by mouth 2 (two) times daily.  . palbociclib (IBRANCE) 100 MG tablet Take 1 tablet (100 mg total) by mouth daily. Take for 21 days on, 7 days off, repeat every 28 days.  . pantoprazole (PROTONIX) 40 MG tablet Take 1 tablet (40 mg total) by mouth daily.  . potassium chloride (KLOR-CON) 10 MEQ tablet Take 1 tablet (10 mEq total) by mouth daily. Take While taking Lasix/furosemide  . promethazine-dextromethorphan (PROMETHAZINE-DM) 6.25-15 MG/5ML syrup Take 5 mLs by mouth every 6 (six) hours as needed for cough.  . rosuvastatin (CRESTOR) 40 MG tablet Take 1 tablet (40 mg total) by mouth daily.  Marland Kitchen senna-docusate (SENOKOT-S) 8.6-50 MG tablet Take 2 tablets by mouth at bedtime.     Allergies:   Codeine and Tramadol   ROS:   Please see the history of present illness.     All other systems reviewed and are negative.   Labs/Other Tests and Data Reviewed:    Recent  Labs: 03/29/2020: TSH 10.600 04/04/2020: B Natriuretic Peptide 92.0 05/01/2020: ALT 14 05/03/2020: Magnesium 1.8 05/05/2020: BUN 20; Creatinine, Ser 1.63; Hemoglobin 8.7; Platelets 146; Potassium 3.7; Sodium 139   Recent Lipid Panel Lab Results  Component Value Date/Time   CHOL 145 03/01/2020 10:08 AM   TRIG 109 03/01/2020 10:08 AM   HDL 46 (L) 03/01/2020 10:08 AM   CHOLHDL 3.2 03/01/2020 10:08 AM   LDLCALC 80 03/01/2020 10:08 AM    Wt Readings from Last 3 Encounters:  05/17/20 216 lb 12.8 oz (98.3 kg)  05/17/20 216 lb 14.9 oz (98.4 kg)  05/01/20 224 lb (101.6 kg)      ASSESSMENT & PLAN:    COVID-19 infection Symptoms for almost 10 days now, afebrile now Outside of antiviral treatment window Appears to be resolving infection Promethazine-DM syrup PRN for cough Advised to get medical attention if she has dyspnea,  wheezing or chest pain with palpitations  Time:   Today, I have spent 8 minutes reviewing the chart, including problem list, medications, and with the patient with telehealth technology discussing the above problems.   Medication Adjustments/Labs and Tests Ordered: Current medicines are reviewed at length with the patient today.  Concerns regarding medicines are outlined above.   Tests Ordered: No orders of the defined types were placed in this encounter.   Medication Changes: Meds ordered this encounter  Medications  . promethazine-dextromethorphan (PROMETHAZINE-DM) 6.25-15 MG/5ML syrup    Sig: Take 5 mLs by mouth every 6 (six) hours as needed for cough.    Dispense:  118 mL    Refill:  0     Note: This dictation was prepared with Dragon dictation along with smaller phrase technology. Similar sounding words can be transcribed inadequately or may not be corrected upon review. Any transcriptional errors that result from this process are unintentional.      Disposition:  Follow up  Signed, [name], MD  06/06/2020 1:59 PM     [place]

## 2020-06-06 NOTE — Patient Instructions (Signed)
Please take Promethazine-DM syrup for cough.  Please continue to self-quarantine for 7 days from COVID test result or at least 24-hour afebrile period, whichever is later.

## 2020-06-11 ENCOUNTER — Telehealth (HOSPITAL_COMMUNITY): Payer: Self-pay | Admitting: *Deleted

## 2020-06-11 NOTE — Telephone Encounter (Signed)
Pt's daughter called and left a VM stating her mom was diagnosed with COVID on 05/31/20 and she's still experiencing COVID symptoms. She is scheduled for an appointment tomorrow due to her still experiencing COVID symptoms her appointment was rescheduled. I called and spoke with her daughter and she verbalized understanding and was transferred to the scheduler to reschedule her mom's appointment.

## 2020-06-12 ENCOUNTER — Ambulatory Visit (HOSPITAL_COMMUNITY): Payer: Medicare PPO | Admitting: Hematology

## 2020-06-12 ENCOUNTER — Inpatient Hospital Stay (HOSPITAL_COMMUNITY): Payer: Medicare PPO

## 2020-06-12 ENCOUNTER — Ambulatory Visit (HOSPITAL_COMMUNITY): Payer: Medicare PPO

## 2020-06-19 NOTE — Telephone Encounter (Signed)
Oral Chemotherapy Pharmacist Encounter   Bethena Roys and myself spoke to patient's daughter on 05/28/20. Ms. Roehr is unsure if she wants to proceed with [facility] and would like to speak with Dr Delton Coombes about the medication before having it shipped.  Informed Lilia Pro, RN for Dr Delton Coombes.   Patient's daughter or clinic will let us know if the patient want to proceed with Ibrance.  Darl Pikes, PharmD, BCPS, BCOP, CPP Hematology/Oncology Clinical Pharmacist [facility] [phone]  06/19/2020 1:10 PM

## 2020-06-25 NOTE — Progress Notes (Signed)
[place]   [phone] [phone]   Clinic Follow up Note   Patient Care Team: [name], MD as PCP - General (Internal Medicine) Satira Sark, MD as PCP - Cardiology (Cardiology) Brien Mates, RN as Oncology Nurse Navigator (Oncology) 06/26/2020   I connected with Robin [name] on 06/26/20 at 10:00 AM EDT by vedio and verified that I am speaking with the correct person using two identifiers.   I discussed the limitations, risks, security and privacy concerns of performing an evaluation and management service by telephone and the availability of in person appointments. I also discussed with the patient that there may be a patient responsible charge related to this service. The patient expressed understanding and agreed to proceed.   [facility]  Provider's location:  Office   CHIEF COMPLAINT: Follow-up for metastatic breast cancer  CURRENT THERAPY: [name], pending anastrozole and Ibrance  INTERVAL HISTORY: Robin [name] returns for follow-up.  She presents to the clinic with her daughter.  I am seeing her in the absence of her primary oncologist Dr. Delton Coombes.  She was last seen about 6 weeks ago.  She has not started anastrozole or Ibrance, she is concerned about potential side effects.  She is overall feeling better, with significant more energy, denies any pain, cough, dyspnea, or other new symptoms.  Appetite and energy level are fair.  She is able to function at home.   All other systems were reviewed with the patient and are negative.  MEDICAL HISTORY:  Past Medical History:  Diagnosis Date  . Breast cancer ([place])    Tamoxifen started 2007  . CAD (coronary artery disease)    DES second diagonal 2/12  . Cardiomyopathy    Probable Takotsubo, LVEF 30-35% 2/12  . Carotid artery disease ([place])   . Essential hypertension   . Hepatic steatosis   . Hyperlipidemia   . Hypothyroidism   . NSTEMI (non-ST elevated  myocardial infarction) ([place])    2/12  . Pancreas divisum   . Stroke ([place])    (2000) residual left-sided weakness  . Type 2 diabetes mellitus ([place])     SURGICAL HISTORY: Past Surgical History:  Procedure Laterality Date  . ABDOMINAL HYSTERECTOMY    . CAROTID ENDARTERECTOMY     Left  . CHOLECYSTECTOMY    . COLONOSCOPY WITH PROPOFOL N/A 05/03/2020   Procedure: COLONOSCOPY WITH PROPOFOL;  Surgeon: Daneil Dolin, MD;  [facility];  Service: Endoscopy;  Laterality: N/A;  . ESOPHAGOGASTRODUODENOSCOPY (EGD) WITH PROPOFOL N/A 05/02/2020   Procedure: ESOPHAGOGASTRODUODENOSCOPY (EGD) WITH PROPOFOL;  Surgeon: Rogene Houston, MD;  [facility];  Service: Endoscopy;  Laterality: N/A;  . GIVENS CAPSULE STUDY N/A 05/04/2020   Procedure: GIVENS CAPSULE STUDY;  Surgeon: Harvel Quale, MD;  [facility];  Service: Gastroenterology;  Laterality: N/A;  . JOINT REPLACEMENT     [facility]  . MASTECTOMY     Bilateral  . POLYPECTOMY  05/03/2020   Procedure: POLYPECTOMY;  Surgeon: Daneil Dolin, MD;  [facility];  Service: Endoscopy;;    I have reviewed the social history and family history with the patient and they are unchanged from previous note.  ALLERGIES:  is allergic to codeine and tramadol.  MEDICATIONS:  Current Outpatient Medications  Medication Sig Dispense Refill  . acetaminophen (TYLENOL) 325 MG tablet Take 2 tablets (650 mg total) by mouth every 6 (six) hours  as needed for mild pain, fever or headache (or Fever >/= 101). 12 tablet 0  . amLODipine (NORVASC) 10 MG tablet Take 1 tablet (10 mg total) by mouth daily. 90 tablet 3  . anastrozole (ARIMIDEX) 1 MG tablet Take 1 tablet (1 mg total) by mouth daily. 30 tablet 3  . apixaban (ELIQUIS) 5 MG TABS tablet Take 1 tablet (5 mg total) by mouth 2 (two) times daily. Start after you complete the initial starter pack of Eliquis 60 tablet 5  . aspirin 81  MG tablet Take 1 tablet (81 mg total) by mouth daily with breakfast. 30 tablet 3  . carvedilol (COREG) 12.5 MG tablet Take 1 tablet (12.5 mg total) by mouth 2 (two) times daily with a meal. 90 tablet 3  . Cholecalciferol (VITAMIN D3) 125 MCG (5000 UT) CAPS TAKE 1 CAPSULE BY MOUTH ONCE DAILY (Patient taking differently: Take 1 capsule by mouth daily.) 90 capsule 1  . ferrous sulfate 325 (65 FE) MG tablet Take 1 tablet (325 mg total) by mouth daily with breakfast. 30 tablet 0  . furosemide (LASIX) 20 MG tablet Take 1 tablet (20 mg total) by mouth daily. 90 tablet 1  . gabapentin (NEURONTIN) 100 MG capsule Take 1 capsule (100 mg total) by mouth 3 (three) times daily. 90 capsule 2  . HYDROcodone-acetaminophen (NORCO/VICODIN) 5-325 MG tablet Take 1 tablet by mouth every 12 (twelve) hours as needed for moderate pain. 20 tablet 0  . isosorbide mononitrate (IMDUR) 30 MG 24 hr tablet Take 1 tablet (30 mg total) by mouth daily. 90 tablet 2  . levothyroxine (SYNTHROID) 137 MCG tablet Take 1 tablet (137 mcg total) by mouth daily before breakfast. 90 tablet 2  . nitroGLYCERIN (NITROSTAT) 0.4 MG SL tablet Place 1 tablet (0.4 mg total) under the tongue every 5 (five) minutes x 3 doses as needed. 25 tablet 3  . Omega-3 Fatty Acids 300 MG CAPS Take 1 capsule (300 mg total) by mouth 2 (two) times daily.    . palbociclib (IBRANCE) 100 MG tablet Take 1 tablet (100 mg total) by mouth daily. Take for 21 days on, 7 days off, repeat every 28 days. 21 tablet 0  . pantoprazole (PROTONIX) 40 MG tablet Take 1 tablet (40 mg total) by mouth daily. 30 tablet 3  . potassium chloride (KLOR-CON) 10 MEQ tablet Take 1 tablet (10 mEq total) by mouth daily. Take While taking Lasix/furosemide 30 tablet 2  . promethazine-dextromethorphan (PROMETHAZINE-DM) 6.25-15 MG/5ML syrup Take 5 mLs by mouth every 6 (six) hours as needed for cough. 118 mL 0  . rosuvastatin (CRESTOR) 40 MG tablet Take 1 tablet (40 mg total) by mouth daily. 90 tablet 2   . senna-docusate (SENOKOT-S) 8.6-50 MG tablet Take 2 tablets by mouth at bedtime. 60 tablet 1   No current facility-administered medications for this visit.    PHYSICAL EXAMINATION: ECOG PERFORMANCE STATUS: 1 - Symptomatic but completely ambulatory  Vitals:   06/26/20 0952 06/26/20 0958  BP: (!) 182/78 (!) 161/47  Pulse: 64   Resp: 18   Temp: (!) 96.8 F (36 C)   SpO2: 99%    Filed Weights   06/26/20 0952  Weight: 213 lb 9.6 oz (96.9 kg)    GENERAL:alert, no distress and comfortable SKIN: skin color, texture, turgor are normal, no rashes or significant lesions EYES: normal, Conjunctiva are pink and non-injected, sclera clear Other exam not performed due to the limitation of virtual visit  LABORATORY DATA:  I have reviewed the data as  listed CBC Latest Ref Rng & Units 06/26/2020 05/05/2020 05/04/2020  WBC 4.0 - 10.5 K/uL 6.6 7.8 6.9  Hemoglobin 12.0 - 15.0 g/dL 10.3(L) 8.7(L) 8.3(L)  Hematocrit 36.0 - 46.0 % 33.5(L) 28.2(L) 27.1(L)  Platelets 150 - 400 K/uL 159 146(L) 161     CMP Latest Ref Rng & Units 06/26/2020 05/05/2020 05/04/2020  Glucose 70 - 99 mg/dL 100(H) 131(H) 100(H)  BUN 8 - 23 mg/dL _0 Creatinine 0.44 - 1.00 mg/dL 1.10(H) 1.63(H) 1.49(H)  Sodium 135 - 145 mmol/L 139 139 139  Potassium 3.5 - 5.1 mmol/L 3.4(L) 3.7 3.4(L)  Chloride 98 - 111 mmol/L 107 109 107  CO2 22 - 32 mmol/L _1 Calcium 8.9 - 10.3 mg/dL 9.6 9.5 9.3  Total Protein 6.5 - 8.1 g/dL 7.3 - -  Total Bilirubin 0.3 - 1.2 mg/dL 0.8 - -  Alkaline Phos 38 - 126 U/L 213(H) - -  AST 15 - 41 U/L 19 - -  ALT 0 - 44 U/L 13 - -      RADIOGRAPHIC STUDIES: I have personally reviewed the radiological images as listed and agreed with the findings in the report. No results found.   ASSESSMENT & PLAN:  1. Recurrent breast carcinoma with metastases to bones and left chest wall, ER+/PR+/HER2- -History of right breast cancer in 1987, status post right mastectomy requiring no adjuvant chemo or  XRT. Was not on tamoxifen. -History of left breast cancer in 2007, status post mastectomy by Dr. Carlis Abbott at [facility], placed on tamoxifen, discontinued around 2015. -ecent upper back pain, weight loss of 20 pounds in the last 3 to 6 months. -CT angio on 04/04/2020 showed irregular mass in the left chest wall concerning for recurrent breast cancer measuring 4.5 x 3.1 cm.  Biopsy confirmed metastatic adenocarcinoma, consistent with breast primary. - PET scan on 05/15/2020 shows hypermetabolic mass in the left anterior chest wall, mild hypermetabolic thoracic lymphadenopathy, diffuse bone metastasis throughout the spine, sternum, both shoulders, pelvis, proximal right femur which are both lytic and sclerotic. -I agree with Dr. Tomie China recommendation of first-line treatment with anastrozole and Ibrance.  I reviewed benefits and potential side effects of each medicine with her in detail.  Due to her advanced age, her concern about side effects, I will start her on anastrozole first.  If she tolerates well in a few months, will consider adding Ibrance then  -Lab reviewed, anemia has improved, creatinine also improved, mild hypokalemia, I reviewed with patient -Will proceed Xgeva injection today, and continue every 6 weeks due to her mild CKD -Follow-up in 6 weeks   2. Left upper extremity DVT: -Left upper extremity Doppler on 04/04/2020 + for DVT involving left internal jugular vein, left subclavian vein, left axillary vein, left brachial and left basilic vein.  3. Bone metastasis -Continue calcium and vitamin D - we will start Xgeva injection  4. HTN, CKD stage III, DM, CAD, hypothyroidism -Continue medication and follow-up with PCP  5.  Anemia secondary to iron deficiency and bone metastasis -She is responding to oral iron, will continue  PLAN:  -Lab reviewed, will proceed Xgeva injection today, and continue every 6 weeks due to her mild CKD -Patient agrees to start  anastrozole now, if she tolerates well, may Ibrance in a few months -Lab, follow-up with Dr. Delton Coombes and injection in 6 weeks   I discussed the assessment and treatment plan with the patient. The patient was provided an opportunity to ask questions and all  were answered. The patient agreed with the plan and demonstrated an understanding of the instructions.   The patient was advised to call back or seek an in-person evaluation if the symptoms worsen or if the condition fails to improve as anticipated.  I provided 30 minutes of face-to-face video visit time during this encounter, and > 50% was spent counseling as documented under my assessment & plan.     Truitt Merle, MD 06/26/20

## 2020-06-26 ENCOUNTER — Other Ambulatory Visit: Payer: Self-pay

## 2020-06-26 ENCOUNTER — Inpatient Hospital Stay (HOSPITAL_COMMUNITY): Payer: Medicare PPO | Attending: Hematology

## 2020-06-26 ENCOUNTER — Inpatient Hospital Stay (HOSPITAL_BASED_OUTPATIENT_CLINIC_OR_DEPARTMENT_OTHER): Payer: Medicare PPO | Admitting: Hematology

## 2020-06-26 ENCOUNTER — Inpatient Hospital Stay (HOSPITAL_COMMUNITY): Payer: Medicare PPO

## 2020-06-26 ENCOUNTER — Encounter (HOSPITAL_COMMUNITY): Payer: Self-pay | Admitting: Hematology

## 2020-06-26 VITALS — BP 161/47 | HR 64 | Temp 96.8°F | Resp 18 | Wt 213.6 lb

## 2020-06-26 DIAGNOSIS — Z86718 Personal history of other venous thrombosis and embolism: Secondary | ICD-10-CM | POA: Diagnosis not present

## 2020-06-26 DIAGNOSIS — C7951 Secondary malignant neoplasm of bone: Secondary | ICD-10-CM | POA: Insufficient documentation

## 2020-06-26 DIAGNOSIS — Z79810 Long term (current) use of selective estrogen receptor modulators (SERMs): Secondary | ICD-10-CM | POA: Diagnosis not present

## 2020-06-26 DIAGNOSIS — Z888 Allergy status to other drugs, medicaments and biological substances status: Secondary | ICD-10-CM | POA: Diagnosis not present

## 2020-06-26 DIAGNOSIS — I82C12 Acute embolism and thrombosis of left internal jugular vein: Secondary | ICD-10-CM | POA: Insufficient documentation

## 2020-06-26 DIAGNOSIS — I2510 Atherosclerotic heart disease of native coronary artery without angina pectoris: Secondary | ICD-10-CM | POA: Insufficient documentation

## 2020-06-26 DIAGNOSIS — Z79899 Other long term (current) drug therapy: Secondary | ICD-10-CM | POA: Insufficient documentation

## 2020-06-26 DIAGNOSIS — Z170 Estrogen receptor positive status [ER+]: Secondary | ICD-10-CM | POA: Insufficient documentation

## 2020-06-26 DIAGNOSIS — I129 Hypertensive chronic kidney disease with stage 1 through stage 4 chronic kidney disease, or unspecified chronic kidney disease: Secondary | ICD-10-CM | POA: Insufficient documentation

## 2020-06-26 DIAGNOSIS — E1122 Type 2 diabetes mellitus with diabetic chronic kidney disease: Secondary | ICD-10-CM | POA: Diagnosis not present

## 2020-06-26 DIAGNOSIS — N183 Chronic kidney disease, stage 3 unspecified: Secondary | ICD-10-CM | POA: Insufficient documentation

## 2020-06-26 DIAGNOSIS — I82622 Acute embolism and thrombosis of deep veins of left upper extremity: Secondary | ICD-10-CM | POA: Diagnosis not present

## 2020-06-26 DIAGNOSIS — D509 Iron deficiency anemia, unspecified: Secondary | ICD-10-CM | POA: Diagnosis not present

## 2020-06-26 DIAGNOSIS — C50912 Malignant neoplasm of unspecified site of left female breast: Secondary | ICD-10-CM | POA: Diagnosis not present

## 2020-06-26 DIAGNOSIS — Z7901 Long term (current) use of anticoagulants: Secondary | ICD-10-CM | POA: Diagnosis not present

## 2020-06-26 DIAGNOSIS — Z885 Allergy status to narcotic agent status: Secondary | ICD-10-CM | POA: Diagnosis not present

## 2020-06-26 DIAGNOSIS — E876 Hypokalemia: Secondary | ICD-10-CM | POA: Diagnosis not present

## 2020-06-26 DIAGNOSIS — Z8673 Personal history of transient ischemic attack (TIA), and cerebral infarction without residual deficits: Secondary | ICD-10-CM | POA: Diagnosis not present

## 2020-06-26 DIAGNOSIS — I429 Cardiomyopathy, unspecified: Secondary | ICD-10-CM | POA: Diagnosis not present

## 2020-06-26 DIAGNOSIS — I252 Old myocardial infarction: Secondary | ICD-10-CM | POA: Insufficient documentation

## 2020-06-26 DIAGNOSIS — Z9049 Acquired absence of other specified parts of digestive tract: Secondary | ICD-10-CM | POA: Diagnosis not present

## 2020-06-26 DIAGNOSIS — E785 Hyperlipidemia, unspecified: Secondary | ICD-10-CM | POA: Insufficient documentation

## 2020-06-26 DIAGNOSIS — E039 Hypothyroidism, unspecified: Secondary | ICD-10-CM | POA: Insufficient documentation

## 2020-06-26 DIAGNOSIS — Z79811 Long term (current) use of aromatase inhibitors: Secondary | ICD-10-CM | POA: Insufficient documentation

## 2020-06-26 LAB — COMPREHENSIVE METABOLIC PANEL
ALT: 13 U/L (ref 0–44)
AST: 19 U/L (ref 15–41)
Albumin: 3.6 g/dL (ref 3.5–5.0)
Alkaline Phosphatase: 213 U/L — ABNORMAL HIGH (ref 38–126)
Anion gap: 7 (ref 5–15)
BUN: 20 mg/dL (ref 8–23)
CO2: 25 mmol/L (ref 22–32)
Calcium: 9.6 mg/dL (ref 8.9–10.3)
Chloride: 107 mmol/L (ref 98–111)
Creatinine, Ser: 1.1 mg/dL — ABNORMAL HIGH (ref 0.44–1.00)
GFR, Estimated: 50 mL/min — ABNORMAL LOW (ref >60)
Glucose, Bld: 100 mg/dL — ABNORMAL HIGH (ref 70–99)
Potassium: 3.4 mmol/L — ABNORMAL LOW (ref 3.5–5.1)
Sodium: 139 mmol/L (ref 135–145)
Total Bilirubin: 0.8 mg/dL (ref 0.3–1.2)
Total Protein: 7.3 g/dL (ref 6.5–8.1)

## 2020-06-26 LAB — CBC WITH DIFFERENTIAL/PLATELET
Abs Immature Granulocytes: 0.02 10*3/uL (ref 0.00–0.07)
Basophils Absolute: 0 10*3/uL (ref 0.0–0.1)
Basophils Relative: 0 %
Eosinophils Absolute: 0.2 10*3/uL (ref 0.0–0.5)
Eosinophils Relative: 2 %
HCT: 33.5 % — ABNORMAL LOW (ref 36.0–46.0)
Hemoglobin: 10.3 g/dL — ABNORMAL LOW (ref 12.0–15.0)
Immature Granulocytes: 0 %
Lymphocytes Relative: 13 %
Lymphs Abs: 0.8 10*3/uL (ref 0.7–4.0)
MCH: 26.4 pg (ref 26.0–34.0)
MCHC: 30.7 g/dL (ref 30.0–36.0)
MCV: 85.9 fL (ref 80.0–100.0)
Monocytes Absolute: 0.6 10*3/uL (ref 0.1–1.0)
Monocytes Relative: 9 %
Neutro Abs: 5 10*3/uL (ref 1.7–7.7)
Neutrophils Relative %: 76 %
Platelets: 159 10*3/uL (ref 150–400)
RBC: 3.9 MIL/uL (ref 3.87–5.11)
RDW: 17.1 % — ABNORMAL HIGH (ref 11.5–15.5)
WBC: 6.6 10*3/uL (ref 4.0–10.5)
nRBC: 0 % (ref 0.0–0.2)

## 2020-06-26 LAB — MAGNESIUM: Magnesium: 2.1 mg/dL (ref 1.7–2.4)

## 2020-06-26 MED ORDER — DENOSUMAB 120 MG/1.7ML ~~LOC~~ SOLN
120.0000 mg | Freq: Once | SUBCUTANEOUS | Status: AC
  Administered 2020-06-26: 120 mg via SUBCUTANEOUS

## 2020-06-26 NOTE — Progress Notes (Signed)
Calcium 9.6.  Pt is taking calcium with vitamin d. Consent singed for x-geva. Vital signs WNL for treatment.  Robin [name] presents today for injection per the provider's orders.  X-geva in right arm administration without incident; injection site WNL; see MAR for injection details.  Patient tolerated procedure well and without incident.  No questions or complaints noted at this time.  Pt stable during and after injection.  AVS reviewed.  Pt discharged in stable condition via wheelchair with daughter.

## 2020-06-26 NOTE — Patient Instructions (Signed)
[place] at [facility] Discharge Instructions  You were seen today by Dr. Burr Medico. Dr. Burr Medico reviewed your recent lab work and discussed the medications, Ibrance (palbociclib) and Arimidex (anastrazole) with you. Because your cancer has spread beyond your breast, it is considered Stage IV and it is not able to be cured but it can be controlled with medication. Without treatment, the tumor in the chest wall will continue to grow and the cancer presence in your bone with grow, which could cause increased pain and discomfort. Dr. Burr Medico has recommended that you begin taking Anastrazole first to limit side effects associated with treatment. If you tolerate Anastrazole well, you can add Ibrance at a later date.   Thank you for choosing [place] at [facility] to provide your oncology and hematology care.  To afford each patient quality time with our provider, please arrive at least 15 minutes before your scheduled appointment time.   If you have a lab appointment with the [facility] please come in thru the Main Entrance and check in at the main information desk.  You need to re-schedule your appointment should you arrive 10 or more minutes late.  We strive to give you quality time with our providers, and arriving late affects you and other patients whose appointments are after yours.  Also, if you no show three or more times for appointments you may be dismissed from the clinic at the providers discretion.     Again, thank you for choosing [facility]  Our hope is that these requests will decrease the amount of time that you wait before being seen by our physicians.       _____________________________________________________________  Should you have questions after your visit to [facility], please contact our office at [phone] and follow the prompts.  Our office hours are 8:00 a.m. and 4:30 p.m. Monday - Friday.  Please note  that voicemails left after 4:00 p.m. may not be returned until the following business day.  We are closed weekends and major holidays.  You do have access to a nurse 24-7, just call the main number to the clinic [phone] and do not press any options, hold on the line and a nurse will answer the phone.    For prescription refill requests, have your pharmacy contact our office and allow 72 hours.    Due to Covid, you will need to wear a mask upon entering the hospital. If you do not have a mask, a mask will be given to you at the Main Entrance upon arrival. For doctor visits, patients may have 1 support person age 23 or older with them. For treatment visits, patients can not have anyone with them due to social distancing guidelines and our immunocompromised population.

## 2020-06-26 NOTE — Patient Instructions (Signed)
[place]  Discharge Instructions: Thank you for choosing [facility] to provide your oncology and hematology care.  If you have a lab appointment with the [facility], please come in thru the Main Entrance and check in at the main information desk.  Wear comfortable clothing and clothing appropriate for easy access to any Portacath or PICC line.   We strive to give you quality time with your provider. You may need to reschedule your appointment if you arrive late (15 or more minutes).  Arriving late affects you and other patients whose appointments are after yours.  Also, if you miss three or more appointments without notifying the office, you may be dismissed from the clinic at the provider's discretion.      For prescription refill requests, have your pharmacy contact our office and allow 72 hours for refills to be completed.    X-geva today.  Return as scheduled.  Please call the clinic if you have any questions or concerns.   To help prevent nausea and vomiting after your treatment, we encourage you to take your nausea medication as directed.  BELOW ARE SYMPTOMS THAT SHOULD BE REPORTED IMMEDIATELY: . *FEVER GREATER THAN 100.4 F (38 C) OR HIGHER . *CHILLS OR SWEATING . *NAUSEA AND VOMITING THAT IS NOT CONTROLLED WITH YOUR NAUSEA MEDICATION . *UNUSUAL SHORTNESS OF BREATH . *UNUSUAL BRUISING OR BLEEDING . *URINARY PROBLEMS (pain or burning when urinating, or frequent urination) . *BOWEL PROBLEMS (unusual diarrhea, constipation, pain near the anus) . TENDERNESS IN MOUTH AND THROAT WITH OR WITHOUT PRESENCE OF ULCERS (sore throat, sores in mouth, or a toothache) . UNUSUAL RASH, SWELLING OR PAIN  . UNUSUAL VAGINAL DISCHARGE OR ITCHING   Items with * indicate a potential emergency and should be followed up as soon as possible or go to the Emergency Department if any problems should occur.  Please show the CHEMOTHERAPY ALERT CARD or IMMUNOTHERAPY ALERT CARD at  check-in to the Emergency Department and triage nurse.  Should you have questions after your visit or need to cancel or reschedule your appointment, please contact [facility] [phone]  and follow the prompts.  Office hours are 8:00 a.m. to 4:30 p.m. Monday - Friday. Please note that voicemails left after 4:00 p.m. may not be returned until the following business day.  We are closed weekends and major holidays. You have access to a nurse at all times for urgent questions. Please call the main number to the clinic [phone] and follow the prompts.  For any non-urgent questions, you may also contact your provider using MyChart. We now offer e-Visits for anyone 71 and older to request care online for non-urgent symptoms. For details visit [email or web address].   Also download the MyChart app! Go to the app store, search "MyChart", open the app, select [facility], and log in with your MyChart username and password.  Due to Covid, a mask is required upon entering the hospital/clinic. If you do not have a mask, one will be given to you upon arrival. For doctor visits, patients may have 1 support person aged 83 or older with them. For treatment visits, patients cannot have anyone with them due to current Covid guidelines and our immunocompromised population.   Denosumab injection What is this medicine? DENOSUMAB (den oh sue mab) slows bone breakdown. Prolia is used to treat osteoporosis in women after menopause and in men, and in people who are taking corticosteroids for 6 months or more. [name] is used to  treat a high calcium level due to cancer and to prevent bone fractures and other bone problems caused by multiple myeloma or cancer bone metastases. [name] is also used to treat giant cell tumor of the bone. This medicine may be used for other purposes; ask your health care provider or pharmacist if you have questions. COMMON BRAND NAME(S): Prolia, XGEVA What should I tell my health  care provider before I take this medicine? They need to know if you have any of these conditions:  dental disease  having surgery or tooth extraction  infection  kidney disease  low levels of calcium or Vitamin D in the blood  malnutrition  on hemodialysis  skin conditions or sensitivity  thyroid or parathyroid disease  an unusual reaction to denosumab, other medicines, foods, dyes, or preservatives  pregnant or trying to get pregnant  breast-feeding How should I use this medicine? This medicine is for injection under the skin. It is given by a health care professional in a hospital or clinic setting. A special MedGuide will be given to you before each treatment. Be sure to read this information carefully each time. For Prolia, talk to your pediatrician regarding the use of this medicine in children. Special care may be needed. For [name], talk to your pediatrician regarding the use of this medicine in children. While this drug may be prescribed for children as young as 13 years for selected conditions, precautions do apply. Overdosage: If you think you have taken too much of this medicine contact a poison control center or emergency room at once. NOTE: This medicine is only for you. Do not share this medicine with others. What if I miss a dose? It is important not to miss your dose. Call your doctor or health care professional if you are unable to keep an appointment. What may interact with this medicine? Do not take this medicine with any of the following medications:  other medicines containing denosumab This medicine may also interact with the following medications:  medicines that lower your chance of fighting infection  steroid medicines like prednisone or cortisone This list may not describe all possible interactions. Give your health care provider a list of all the medicines, herbs, non-prescription drugs, or dietary supplements you use. Also tell them if you smoke,  drink alcohol, or use illegal drugs. Some items may interact with your medicine. What should I watch for while using this medicine? Visit your doctor or health care professional for regular checks on your progress. Your doctor or health care professional may order blood tests and other tests to see how you are doing. Call your doctor or health care professional for advice if you get a fever, chills or sore throat, or other symptoms of a cold or flu. Do not treat yourself. This drug may decrease your body's ability to fight infection. Try to avoid being around people who are sick. You should make sure you get enough calcium and vitamin D while you are taking this medicine, unless your doctor tells you not to. Discuss the foods you eat and the vitamins you take with your health care professional. See your dentist regularly. Brush and floss your teeth as directed. Before you have any dental work done, tell your dentist you are receiving this medicine. Do not become pregnant while taking this medicine or for 5 months after stopping it. Talk with your doctor or health care professional about your birth control options while taking this medicine. Women should inform their doctor if they  wish to become pregnant or think they might be pregnant. There is a potential for serious side effects to an unborn child. Talk to your health care professional or pharmacist for more information. What side effects may I notice from receiving this medicine? Side effects that you should report to your doctor or health care professional as soon as possible:  allergic reactions like skin rash, itching or hives, swelling of the face, lips, or tongue  bone pain  breathing problems  dizziness  jaw pain, especially after dental work  redness, blistering, peeling of the skin  signs and symptoms of infection like fever or chills; cough; sore throat; pain or trouble passing urine  signs of low calcium like fast heartbeat,  muscle cramps or muscle pain; pain, tingling, numbness in the hands or feet; seizures  unusual bleeding or bruising  unusually weak or tired Side effects that usually do not require medical attention (report to your doctor or health care professional if they continue or are bothersome):  constipation  diarrhea  headache  joint pain  loss of appetite  muscle pain  runny nose  tiredness  upset stomach This list may not describe all possible side effects. Call your doctor for medical advice about side effects. You may report side effects to FDA at 1-800-FDA-1088. Where should I keep my medicine? This medicine is only given in a clinic, doctor's office, or other health care setting and will not be stored at home. NOTE: This sheet is a summary. It may not cover all possible information. If you have questions about this medicine, talk to your doctor, pharmacist, or health care provider.  2021 Elsevier/Gold Standard (2017-05-15 16:10:44)

## 2020-06-27 LAB — CANCER ANTIGEN 15-3: CA 15-3: 40.2 U/mL — ABNORMAL HIGH (ref 0.0–25.0)

## 2020-06-28 LAB — CANCER ANTIGEN 27.29: CA 27.29: 56.3 U/mL — ABNORMAL HIGH (ref 0.0–38.6)

## 2020-07-05 ENCOUNTER — Ambulatory Visit: Payer: Medicare PPO | Admitting: Nurse Practitioner

## 2020-07-12 ENCOUNTER — Ambulatory Visit: Payer: Medicare PPO | Admitting: Nurse Practitioner

## 2020-07-12 ENCOUNTER — Ambulatory Visit: Payer: Medicare PPO | Admitting: Internal Medicine

## 2020-07-19 ENCOUNTER — Other Ambulatory Visit: Payer: Self-pay

## 2020-07-19 ENCOUNTER — Encounter: Payer: Self-pay | Admitting: Family Medicine

## 2020-07-19 ENCOUNTER — Ambulatory Visit: Payer: Medicare PPO | Admitting: Internal Medicine

## 2020-07-19 ENCOUNTER — Ambulatory Visit (INDEPENDENT_AMBULATORY_CARE_PROVIDER_SITE_OTHER): Payer: Medicare PPO | Admitting: Family Medicine

## 2020-07-19 VITALS — BP 130/80 | HR 66 | Temp 98.5°F | Resp 18 | Ht 66.0 in | Wt 214.0 lb

## 2020-07-19 DIAGNOSIS — I82622 Acute embolism and thrombosis of deep veins of left upper extremity: Secondary | ICD-10-CM | POA: Diagnosis not present

## 2020-07-19 DIAGNOSIS — M7989 Other specified soft tissue disorders: Secondary | ICD-10-CM | POA: Insufficient documentation

## 2020-07-19 DIAGNOSIS — I10 Essential (primary) hypertension: Secondary | ICD-10-CM

## 2020-07-19 DIAGNOSIS — E039 Hypothyroidism, unspecified: Secondary | ICD-10-CM

## 2020-07-19 DIAGNOSIS — C50912 Malignant neoplasm of unspecified site of left female breast: Secondary | ICD-10-CM

## 2020-07-19 DIAGNOSIS — M1611 Unilateral primary osteoarthritis, right hip: Secondary | ICD-10-CM

## 2020-07-19 MED ORDER — POTASSIUM CHLORIDE CRYS ER 10 MEQ PO TBCR: 10.0000 meq | EXTENDED_RELEASE_TABLET | Freq: Every day | ORAL | 1 refills | Status: DC

## 2020-07-19 NOTE — Patient Instructions (Signed)
F/U in 3 months, with Dr Posey Pronto , call if you need to be seen sooner  No new medication  Excellent BP  You are referred to vascular/ vein specialist re leg swelling  Please wear hose every day and keep legs elevated  Be careful not to fall   Thanks for choosing [facility], we consider it a privelige to serve you.

## 2020-07-20 LAB — T4, FREE: Free T4: 1.91 ng/dL — ABNORMAL HIGH (ref 0.82–1.77)

## 2020-07-20 LAB — TSH: TSH: 0.173 u[IU]/mL — ABNORMAL LOW (ref 0.450–4.500)

## 2020-07-20 MED ORDER — LEVOTHYROXINE SODIUM 125 MCG PO TABS: 125.0000 ug | ORAL_TABLET | Freq: Every day | ORAL | 1 refills | Status: DC

## 2020-07-20 NOTE — Addendum Note (Signed)
Addended by: Brita Romp on: 07/20/2020 08:11 AM   Modules accepted: Orders

## 2020-07-23 ENCOUNTER — Encounter: Payer: Self-pay | Admitting: Family Medicine

## 2020-07-23 NOTE — Assessment & Plan Note (Signed)
Managed by Oncology on arimidex

## 2020-07-23 NOTE — Assessment & Plan Note (Signed)
Reduced mobility, home safety reviewed briefly

## 2020-07-23 NOTE — Progress Notes (Signed)
   Robin [name]     MRN: [id number]      DOB: 1940/06/15   HPI Robin [name] is here for follow up and re-evaluation of chronic medical conditions, medication management and review of any available recent lab and radiology data.  She is accompanied by one of her daughters   The PT denies any adverse reactions to current medications since the last visit.  C/o bilateral leg swelling and daughter states patient does not elevate her legs as much as she should, they are working on getting a recliner chair also although she has compression stockings they are old and she does not wear them as she should. She is interested in evaluation by Vascular surgery, I advise that the recommendations will most likely be the same Specifically denies PND, orthopnea or increased exertional fatigue  ROS Denies recent fever or chills. Denies sinus pressure, nasal congestion, ear pain or sore throat. Denies chest congestion, productive cough or wheezing. Denies chest pains, palpitations  Denies abdominal pain, nausea, vomiting,diarrhea or constipation.   Denies dysuria, frequency, Chronic  joint pain, swelling and limitation in mobility. Denies headaches, seizures, numbness, or tingling. Denies depression, anxiety or insomnia. Denies skin break down or rash.   PE  BP 130/80   Pulse 66   Temp 98.5 F (36.9 C)   Resp 18   Ht 5\' 6"  (1.676 m)   Wt 214 lb (97.1 kg)   SpO2 94%   BMI 34.54 kg/m   Patient alert and oriented and in no cardiopulmonary distress.  HEENT: No facial asymmetry, EOMI,     Neck supple .  Chest: Clear to auscultation bilaterally.  CVS: S1, S2 no murmurs, no S3.Regular rate.  ABD: Soft non tender.   Ext: one plus pitting edema bilaterally  MS: decreased ROM spine, shoulders, hips and knees.  Skin: Intact, no ulcerations or rash noted.  Psych: Good eye contact, normal affect. Memory intact not anxious or depressed appearing.  CNS: CN 2-12 intact, power,  normal  throughout.no focal deficits noted.   Assessment & Plan  Acute DVT (deep venous thrombosis) --LtUE---03/2020 Maintained on eliquis  Leg swelling Pt to eleavte legs and wear compression hose, also referred to Vascular  Essential hypertension, benign Controlled, no change in medication   Recurrent malignant neoplasm of left breast ([place]) Managed by Oncology on arimidex  OA (osteoarthritis) of hip Reduced mobility, home safety reviewed briefly  Hypothyroidism Managed by Endo

## 2020-07-23 NOTE — Assessment & Plan Note (Signed)
Pt to eleavte legs and wear compression hose, also referred to Vascular

## 2020-07-23 NOTE — Assessment & Plan Note (Signed)
Maintained on eliquis 

## 2020-07-23 NOTE — Assessment & Plan Note (Signed)
Controlled, no change in medication  

## 2020-07-23 NOTE — Assessment & Plan Note (Signed)
Managed by Endo 

## 2020-07-25 ENCOUNTER — Inpatient Hospital Stay (HOSPITAL_COMMUNITY): Admission: RE | Admit: 2020-07-25 | Payer: Medicare PPO | Source: Ambulatory Visit

## 2020-07-27 ENCOUNTER — Ambulatory Visit: Payer: Medicare PPO | Admitting: Internal Medicine

## 2020-07-30 ENCOUNTER — Telehealth: Payer: Self-pay | Admitting: Internal Medicine

## 2020-07-30 ENCOUNTER — Ambulatory Visit: Payer: Medicare PPO | Admitting: Nurse Practitioner

## 2020-07-30 NOTE — Telephone Encounter (Signed)
Can you check on this?

## 2020-07-30 NOTE — Telephone Encounter (Signed)
Robin [name] is calling states that [name] has swelling in lower extrimities and  pt is in the bed and the legs are hurting very tight

## 2020-07-30 NOTE — Telephone Encounter (Signed)
She was seen on 6/30 for leg swelling and was referred to vascular and vein to evaluate the leg swelling. Can you check status of referral?

## 2020-08-06 ENCOUNTER — Other Ambulatory Visit (HOSPITAL_COMMUNITY): Payer: Self-pay | Admitting: *Deleted

## 2020-08-06 DIAGNOSIS — C7951 Secondary malignant neoplasm of bone: Secondary | ICD-10-CM

## 2020-08-06 DIAGNOSIS — C50912 Malignant neoplasm of unspecified site of left female breast: Secondary | ICD-10-CM

## 2020-08-07 ENCOUNTER — Other Ambulatory Visit: Payer: Self-pay

## 2020-08-07 ENCOUNTER — Inpatient Hospital Stay (HOSPITAL_COMMUNITY): Payer: Medicare PPO

## 2020-08-07 ENCOUNTER — Encounter (HOSPITAL_COMMUNITY): Payer: Self-pay | Admitting: Hematology and Oncology

## 2020-08-07 ENCOUNTER — Inpatient Hospital Stay (HOSPITAL_COMMUNITY): Payer: Medicare PPO | Attending: Hematology

## 2020-08-07 ENCOUNTER — Other Ambulatory Visit (HOSPITAL_COMMUNITY): Payer: Medicare PPO

## 2020-08-07 ENCOUNTER — Ambulatory Visit (HOSPITAL_COMMUNITY): Payer: Medicare PPO | Admitting: Hematology and Oncology

## 2020-08-07 ENCOUNTER — Ambulatory Visit (HOSPITAL_COMMUNITY): Payer: Medicare PPO

## 2020-08-07 ENCOUNTER — Inpatient Hospital Stay (HOSPITAL_COMMUNITY): Payer: Medicare PPO | Admitting: Hematology and Oncology

## 2020-08-07 VITALS — BP 144/55 | HR 59 | Temp 96.8°F | Resp 20 | Wt 216.6 lb

## 2020-08-07 DIAGNOSIS — Z7901 Long term (current) use of anticoagulants: Secondary | ICD-10-CM | POA: Insufficient documentation

## 2020-08-07 DIAGNOSIS — Z170 Estrogen receptor positive status [ER+]: Secondary | ICD-10-CM | POA: Insufficient documentation

## 2020-08-07 DIAGNOSIS — Z79810 Long term (current) use of selective estrogen receptor modulators (SERMs): Secondary | ICD-10-CM | POA: Diagnosis not present

## 2020-08-07 DIAGNOSIS — Z79899 Other long term (current) drug therapy: Secondary | ICD-10-CM | POA: Diagnosis not present

## 2020-08-07 DIAGNOSIS — Z885 Allergy status to narcotic agent status: Secondary | ICD-10-CM | POA: Diagnosis not present

## 2020-08-07 DIAGNOSIS — R0602 Shortness of breath: Secondary | ICD-10-CM

## 2020-08-07 DIAGNOSIS — C50912 Malignant neoplasm of unspecified site of left female breast: Secondary | ICD-10-CM | POA: Diagnosis not present

## 2020-08-07 DIAGNOSIS — Z86718 Personal history of other venous thrombosis and embolism: Secondary | ICD-10-CM | POA: Insufficient documentation

## 2020-08-07 DIAGNOSIS — Z853 Personal history of malignant neoplasm of breast: Secondary | ICD-10-CM | POA: Diagnosis not present

## 2020-08-07 DIAGNOSIS — I82622 Acute embolism and thrombosis of deep veins of left upper extremity: Secondary | ICD-10-CM

## 2020-08-07 DIAGNOSIS — C7951 Secondary malignant neoplasm of bone: Secondary | ICD-10-CM

## 2020-08-07 DIAGNOSIS — I10 Essential (primary) hypertension: Secondary | ICD-10-CM

## 2020-08-07 DIAGNOSIS — Z888 Allergy status to other drugs, medicaments and biological substances status: Secondary | ICD-10-CM | POA: Diagnosis not present

## 2020-08-07 LAB — COMPREHENSIVE METABOLIC PANEL
ALT: 12 U/L (ref 0–44)
AST: 17 U/L (ref 15–41)
Albumin: 3.9 g/dL (ref 3.5–5.0)
Alkaline Phosphatase: 98 U/L (ref 38–126)
Anion gap: 8 (ref 5–15)
BUN: 21 mg/dL (ref 8–23)
CO2: 21 mmol/L — ABNORMAL LOW (ref 22–32)
Calcium: 8.8 mg/dL — ABNORMAL LOW (ref 8.9–10.3)
Chloride: 108 mmol/L (ref 98–111)
Creatinine, Ser: 1.37 mg/dL — ABNORMAL HIGH (ref 0.44–1.00)
GFR, Estimated: 39 mL/min — ABNORMAL LOW (ref >60)
Glucose, Bld: 110 mg/dL — ABNORMAL HIGH (ref 70–99)
Potassium: 3.5 mmol/L (ref 3.5–5.1)
Sodium: 137 mmol/L (ref 135–145)
Total Bilirubin: 0.4 mg/dL (ref 0.3–1.2)
Total Protein: 7.5 g/dL (ref 6.5–8.1)

## 2020-08-07 LAB — CBC WITH DIFFERENTIAL/PLATELET
Abs Immature Granulocytes: 0.03 10*3/uL (ref 0.00–0.07)
Basophils Absolute: 0 10*3/uL (ref 0.0–0.1)
Basophils Relative: 0 %
Eosinophils Absolute: 0.1 10*3/uL (ref 0.0–0.5)
Eosinophils Relative: 2 %
HCT: 38.2 % (ref 36.0–46.0)
Hemoglobin: 11.4 g/dL — ABNORMAL LOW (ref 12.0–15.0)
Immature Granulocytes: 0 %
Lymphocytes Relative: 14 %
Lymphs Abs: 1 10*3/uL (ref 0.7–4.0)
MCH: 25.1 pg — ABNORMAL LOW (ref 26.0–34.0)
MCHC: 29.8 g/dL — ABNORMAL LOW (ref 30.0–36.0)
MCV: 84.1 fL (ref 80.0–100.0)
Monocytes Absolute: 0.6 10*3/uL (ref 0.1–1.0)
Monocytes Relative: 8 %
Neutro Abs: 5.4 10*3/uL (ref 1.7–7.7)
Neutrophils Relative %: 76 %
Platelets: 168 10*3/uL (ref 150–400)
RBC: 4.54 MIL/uL (ref 3.87–5.11)
RDW: 16.2 % — ABNORMAL HIGH (ref 11.5–15.5)
WBC: 7.2 10*3/uL (ref 4.0–10.5)
nRBC: 0 % (ref 0.0–0.2)

## 2020-08-07 LAB — VITAMIN D 25 HYDROXY (VIT D DEFICIENCY, FRACTURES): Vit D, 25-Hydroxy: 56.68 ng/mL (ref 30–100)

## 2020-08-07 MED ORDER — DENOSUMAB 120 MG/1.7ML ~~LOC~~ SOLN
SUBCUTANEOUS | Status: AC
  Filled 2020-08-07: qty 1.7

## 2020-08-07 MED ORDER — DENOSUMAB 120 MG/1.7ML ~~LOC~~ SOLN
120.0000 mg | Freq: Once | SUBCUTANEOUS | Status: AC
  Administered 2020-08-07: 120 mg via SUBCUTANEOUS

## 2020-08-07 NOTE — Progress Notes (Signed)
[facility] [address], [place] [address]   Patient Care Team: [name], MD as PCP - General (Internal Medicine) Satira Sark, MD as PCP - Cardiology (Cardiology) Brien Mates, RN as Oncology Nurse Navigator (Oncology)  SUMMARY OF ONCOLOGIC HISTORY: Oncology History   No history exists.    CHIEF COMPLIANT: Follow-up for recurrent left breast cancer with metastases to bones  INTERVAL HISTORY: [name] is a 80 y.o. female here today for follow up of her recurrent left breast cancer with metastases to bones. Her last visit was on 06/26/2020 via telephone with Dr. Burr Medico.   On exam today Robin [name] is accompanied by her daughter.  She reports that she is doing quite well.  She notes that the anastrozole medication is not causing any side effects.  She denies having any hot flashes or joint pains.  She notes that she is gaining weight is up a few pounds.  She is taking Lasix to try to help with her leg swelling.  She otherwise denies any pain, fevers, chills, sweats, nausea, vomiting or diarrhea.  A full 10 point ROS is listed below.   REVIEW OF SYSTEMS:   Review of Systems  Constitutional:  Positive for fatigue (50%) and unexpected weight change (lost 8 lbs in 1 month). Negative for appetite change.  HENT:   Negative for nosebleeds.   Cardiovascular:  Positive for chest pain (R anterior chest wall pain) and leg swelling (feet & legs; L leg > R).  Gastrointestinal:  Negative for blood in stool, diarrhea, nausea and vomiting.  Genitourinary:  Negative for hematuria.   Musculoskeletal:  Positive for arthralgias (R shoulder pain).  Neurological:  Positive for extremity weakness (residual L sided weakness post CVA).   I have reviewed the past medical history, past surgical history, social history and family history with the patient and they are unchanged from previous note.   ALLERGIES:   is allergic to codeine and  tramadol.   MEDICATIONS:  Current Outpatient Medications  Medication Sig Dispense Refill   amLODipine (NORVASC) 10 MG tablet Take 1 tablet (10 mg total) by mouth daily. 90 tablet 3   anastrozole (ARIMIDEX) 1 MG tablet Take 1 tablet (1 mg total) by mouth daily. 30 tablet 3   apixaban (ELIQUIS) 5 MG TABS tablet Take 1 tablet (5 mg total) by mouth 2 (two) times daily. Start after you complete the initial starter pack of Eliquis 60 tablet 5   aspirin 81 MG tablet Take 1 tablet (81 mg total) by mouth daily with breakfast. 30 tablet 3   carvedilol (COREG) 12.5 MG tablet Take 1 tablet (12.5 mg total) by mouth 2 (two) times daily with a meal. 90 tablet 3   Cholecalciferol (VITAMIN D3) 125 MCG (5000 UT) CAPS TAKE 1 CAPSULE BY MOUTH ONCE DAILY (Patient taking differently: Take 1 capsule by mouth daily.) 90 capsule 1   ferrous sulfate 325 (65 FE) MG tablet Take 1 tablet (325 mg total) by mouth daily with breakfast. 30 tablet 0   furosemide (LASIX) 20 MG tablet Take 1 tablet (20 mg total) by mouth daily. 90 tablet 1   gabapentin (NEURONTIN) 100 MG capsule Take 1 capsule (100 mg total) by mouth 3 (three) times daily. 90 capsule 2   isosorbide mononitrate (IMDUR) 30 MG 24 hr tablet Take 1 tablet (30 mg total) by mouth daily. 90 tablet 2   levothyroxine (SYNTHROID) 125 MCG tablet Take 1 tablet (125 mcg total) by  mouth daily before breakfast. 90 tablet 1   nitroGLYCERIN (NITROSTAT) 0.4 MG SL tablet Place 1 tablet (0.4 mg total) under the tongue every 5 (five) minutes x 3 doses as needed. 25 tablet 3   Omega-3 Fatty Acids 300 MG CAPS Take 1 capsule (300 mg total) by mouth 2 (two) times daily.     palbociclib (IBRANCE) 100 MG tablet Take 1 tablet (100 mg total) by mouth daily. Take for 21 days on, 7 days off, repeat every 28 days. 21 tablet 0   pantoprazole (PROTONIX) 40 MG tablet Take 1 tablet (40 mg total) by mouth daily. 30 tablet 3   potassium chloride (KLOR-CON) 10 MEQ tablet Take 1 tablet (10 mEq total) by  mouth daily. Take While taking Lasix/furosemide 30 tablet 2   potassium chloride (KLOR-CON) 10 MEQ tablet Take 1 tablet (10 mEq total) by mouth daily. 90 tablet 1   promethazine-dextromethorphan (PROMETHAZINE-DM) 6.25-15 MG/5ML syrup Take 5 mLs by mouth every 6 (six) hours as needed for cough. 118 mL 0   rosuvastatin (CRESTOR) 40 MG tablet Take 1 tablet (40 mg total) by mouth daily. 90 tablet 2   senna-docusate (SENOKOT-S) 8.6-50 MG tablet Take 2 tablets by mouth at bedtime. 60 tablet 1   acetaminophen (TYLENOL) 325 MG tablet Take 2 tablets (650 mg total) by mouth every 6 (six) hours as needed for mild pain, fever or headache (or Fever >/= 101). (Patient not taking: Reported on 08/07/2020) 12 tablet 0   HYDROcodone-acetaminophen (NORCO/VICODIN) 5-325 MG tablet Take 1 tablet by mouth every 12 (twelve) hours as needed for moderate pain. (Patient not taking: Reported on 08/07/2020) 20 tablet 0   No current facility-administered medications for this visit.   Facility-Administered Medications Ordered in Other Visits  Medication Dose Route Frequency Provider Last Rate Last Admin   denosumab (XGEVA) injection 120 mg  120 mg Subcutaneous Once Doreatha Massed, MD         PHYSICAL EXAMINATION: Performance status (ECOG): 2 - Symptomatic, <50% confined to bed  Vitals:   08/07/20 0944  BP: (!) 144/55  Pulse: (!) 59  Resp: 20  Temp: (!) 96.8 F (36 C)  SpO2: 98%   Wt Readings from Last 3 Encounters:  08/07/20 216 lb 9.6 oz (98.2 kg)  07/19/20 214 lb (97.1 kg)  06/26/20 213 lb 9.6 oz (96.9 kg)   Physical Exam Vitals reviewed.  Constitutional:      Appearance: Normal appearance. She is obese.  Cardiovascular:     Rate and Rhythm: Normal rate and regular rhythm.     Pulses: Normal pulses.     Heart sounds: Normal heart sounds.  Pulmonary:     Effort: Pulmonary effort is normal.     Breath sounds: Normal breath sounds.  Chest:  Breasts:    Right: Absent. No mass, skin change or  tenderness.     Left: Absent. No mass, skin change or tenderness.  Abdominal:     Palpations: Abdomen is soft. There is no mass.     Tenderness: There is no abdominal tenderness.  Musculoskeletal:     Cervical back: Bony tenderness (R anterior scapula TTP) present.     Right lower leg: Edema (1+) present.     Left lower leg: Edema (2+) present.  Neurological:     General: No focal deficit present.     Mental Status: She is alert and oriented to person, place, and time.  Psychiatric:        Mood and Affect: Mood normal.  Behavior: Behavior normal.    Breast Exam Chaperone: Milinda Antis, MD     LABORATORY DATA:  I have reviewed the data as listed CMP Latest Ref Rng & Units 08/07/2020 06/26/2020 05/05/2020  Glucose 70 - 99 mg/dL 110(H) 100(H) 131(H)  BUN 8 - 23 mg/dL $Remove'21 20 20  '[id number]$ Creatinine 0.44 - 1.00 mg/dL 1.37(H) 1.10(H) 1.63(H)  Sodium 135 - 145 mmol/L 137 139 139  Potassium 3.5 - 5.1 mmol/L 3.5 3.4(L) 3.7  Chloride 98 - 111 mmol/L 108 107 109  CO2 22 - 32 mmol/L 21(L) 25 22  Calcium 8.9 - 10.3 mg/dL 8.8(L) 9.6 9.5  Total Protein 6.5 - 8.1 g/dL 7.5 7.3 -  Total Bilirubin 0.3 - 1.2 mg/dL 0.4 0.8 -  Alkaline Phos 38 - 126 U/L 98 213(H) -  AST 15 - 41 U/L 17 19 -  ALT 0 - 44 U/L 12 13 -   Lab Results  Component Value Date   CAN153 40.2 (H) 06/26/2020   Lab Results  Component Value Date   WBC 7.2 08/07/2020   HGB 11.4 (L) 08/07/2020   HCT 38.2 08/07/2020   MCV 84.1 08/07/2020   PLT 168 08/07/2020   NEUTROABS 5.4 08/07/2020   Surgical pathology ([id number]) on 04/05/2020: Chest wall needle core biopsy: recurrent breast carcinoma, ER/PR positive, HER-2 negative.  ASSESSMENT:  1.  Recurrent breast carcinoma with metastases to bones: -History of right breast cancer in 1987, status post right mastectomy requiring no adjuvant chemo or XRT.  Was not on tamoxifen. -History of left breast cancer in 2007, status post mastectomy by Dr. Carlis Abbott at [facility],  placed on tamoxifen, discontinued around 2015. -Recent upper back pain, weight loss of 20 pounds in the last 3 to 6 months. -CT angio on 04/04/2020 showed irregular mass in the left chest wall concerning for recurrent breast cancer measuring 4.5 x 3.1 cm. -Also showed lytic lesions in T1 and T3 vertebral bodies as well as manubrium. -Left chest wall mass biopsy on 04/05/2020 consistent with adenocarcinoma compatible with recurrent breast carcinoma.  ER 95% positive, PR 40% positive, HER2 2+, negative by FISH. - PET scan on 05/15/2020 shows hypermetabolic mass in the left anterior chest wall, mild hypermetabolic thoracic lymphadenopathy, diffuse bone metastasis throughout the spine, sternum, both shoulders, pelvis, proximal right femur which are both lytic and sclerotic.   2.  Left upper extremity DVT: -Left upper extremity Doppler on 04/04/2020 + for DVT involving left internal jugular vein, left subclavian vein, left axillary vein, left brachial and left basilic vein.  3.  Family history: - No major family history of malignancies other than her brother with throat cancer.   PLAN:  1.  Metastatic breast cancer to the bones, ER/PR positive: -History of right breast cancer in 1987, status post right mastectomy requiring no adjuvant chemo or XRT.  Was not on tamoxifen. -History of left breast cancer in 2007, status post mastectomy by Dr. Carlis Abbott at [facility], placed on tamoxifen, discontinued around 2015. -ecent upper back pain, weight loss of 20 pounds in the last 3 to 6 months. -CT angio on 04/04/2020 showed irregular mass in the left chest wall concerning for recurrent breast cancer measuring 4.5 x 3.1 cm.  Biopsy confirmed metastatic adenocarcinoma, consistent with breast primary. - PET scan on 05/15/2020 shows hypermetabolic mass in the left anterior chest wall, mild hypermetabolic thoracic lymphadenopathy, diffuse bone metastasis throughout the spine, sternum, both shoulders, pelvis,  proximal right femur which are both lytic and sclerotic. -I agree  with Dr. Tomie China recommendation of first-line treatment with anastrozole and Ibrance.  I reviewed benefits and potential side effects of each medicine with her in detail.  Due to her advanced age, her concern about side effects -- Patient is currently taking the anastrozole medication only.  She notes she does not wish to start the palbociclib because she is tolerating the anastrozole so well. -Lab reviewed, Hgb 11.4 (uptrending), creatinine at 1.37 -Will proceed Xgeva injection today, and continue every 6 weeks due to her mild CKD --will order a 3 months f/u scan today.  -Follow-up in 6 weeks   2.  Left upper extremity DVT: - Continue Eliquis.  No bleeding issues reported.  3.  Right chest wall and scapular pain: - She is taking Tylenol for the pain which is controlling it well. - If it is poorly controlled will consider tramadol.  4.  Bone metastasis: - We talked about initiating her on denosumab to decrease skeletal related events. - We talked about side effects including hypocalcemia and rare chance of osteonecrosis of the jaw.  She has teeth in her lower jaw but has regular dental follow-ups. -  denosumab today.  She reports she is taking calcium and vitamin D.   Orders placed this encounter:  Orders Placed This Encounter  Procedures   CT CHEST ABDOMEN PELVIS W CONTRAST    The patient has a good understanding of the overall plan. She agrees with it. She will call with any problems that may develop before the next visit here.  Total time spent is 30 minutes with more than 50% of the time spent face-to-face discussing new diagnosis, prognosis, treatment plan, counseling and coordination of care.  Ledell Peoples, MD Department of Hematology/Oncology [place] [facility] Phone: [phone] Pager: [phone] Email: [email or web address]

## 2020-08-07 NOTE — Progress Notes (Signed)
Patient tolerated Xgeva injection with no complaints voiced.  Site clean and dry with no bruising or swelling noted.  No complaints of pain.  Discharged with vital signs stable and no signs or symptoms of distress noted.  

## 2020-08-08 ENCOUNTER — Ambulatory Visit: Payer: Medicare PPO | Admitting: Internal Medicine

## 2020-08-08 LAB — CANCER ANTIGEN 15-3: CA 15-3: 33.6 U/mL — ABNORMAL HIGH (ref 0.0–25.0)

## 2020-08-08 LAB — CANCER ANTIGEN 27.29: CA 27.29: 33.5 U/mL (ref 0.0–38.6)

## 2020-08-08 NOTE — Patient Instructions (Signed)

## 2020-08-09 ENCOUNTER — Encounter: Payer: Self-pay | Admitting: Nurse Practitioner

## 2020-08-09 ENCOUNTER — Other Ambulatory Visit: Payer: Self-pay

## 2020-08-09 ENCOUNTER — Ambulatory Visit (INDEPENDENT_AMBULATORY_CARE_PROVIDER_SITE_OTHER): Payer: Medicare PPO | Admitting: Nurse Practitioner

## 2020-08-09 VITALS — BP 141/67 | HR 58 | Ht 66.0 in | Wt 215.4 lb

## 2020-08-09 DIAGNOSIS — E559 Vitamin D deficiency, unspecified: Secondary | ICD-10-CM | POA: Diagnosis not present

## 2020-08-09 DIAGNOSIS — E039 Hypothyroidism, unspecified: Secondary | ICD-10-CM

## 2020-08-09 NOTE — Progress Notes (Signed)
Endocrinology follow-up note   Subjective:    Patient ID: Robin [name], female    DOB: 1940-12-27, PCP [name], MD Patient presents today for follow-up of hypothyroidism.    Past Medical History:  Diagnosis Date   Breast cancer ([place])    Tamoxifen started 2007   CAD (coronary artery disease)    DES second diagonal 2/12   Cardiomyopathy    Probable Takotsubo, LVEF 30-35% 2/12   Carotid artery disease ([facility])    Essential hypertension    Hepatic steatosis    Hyperlipidemia    Hypothyroidism    NSTEMI (non-ST elevated myocardial infarction) ([place])    2/12   Pancreas divisum    Stroke ([place])    (2000) residual left-sided weakness   Type 2 diabetes mellitus ([place])    Past Surgical History:  Procedure Laterality Date   ABDOMINAL HYSTERECTOMY     CAROTID ENDARTERECTOMY     Left   CHOLECYSTECTOMY     COLONOSCOPY WITH PROPOFOL N/A 05/03/2020   Procedure: COLONOSCOPY WITH PROPOFOL;  Surgeon: Daneil Dolin, MD;  [facility];  Service: Endoscopy;  Laterality: N/A;   ESOPHAGOGASTRODUODENOSCOPY (EGD) WITH PROPOFOL N/A 05/02/2020   Procedure: ESOPHAGOGASTRODUODENOSCOPY (EGD) WITH PROPOFOL;  Surgeon: Rogene Houston, MD;  [facility];  Service: Endoscopy;  Laterality: N/A;   GIVENS CAPSULE STUDY N/A 05/04/2020   Procedure: GIVENS CAPSULE STUDY;  Surgeon: Harvel Quale, MD;  [facility];  Service: Gastroenterology;  Laterality: N/A;   JOINT REPLACEMENT     Right knee replacement- [facility]   MASTECTOMY     Bilateral   POLYPECTOMY  05/03/2020   Procedure: POLYPECTOMY;  Surgeon: Daneil Dolin, MD;  [facility];  Service: Endoscopy;;   Social History   Socioeconomic History   Marital status: Widowed    Spouse name: Not on file   Number of children: Not on file   Years of education: Not on file   Highest education level: Not on file  Occupational History   Not on file  Tobacco Use   Smoking status:  Never   Smokeless tobacco: Never  Vaping Use   Vaping Use: Never used  Substance and Sexual Activity   Alcohol use: No    Alcohol/week: 0.0 standard drinks   Drug use: No   Sexual activity: Not on file  Other Topics Concern   Not on file  Social History Narrative   Not on file   Social Determinants of Health   Financial Resource Strain: Not on file  Food Insecurity: Not on file  Transportation Needs: Not on file  Physical Activity: Not on file  Stress: Not on file  Social Connections: Not on file   Outpatient Encounter Medications as of 08/09/2020  Medication Sig   acetaminophen (TYLENOL) 325 MG tablet Take 2 tablets (650 mg total) by mouth every 6 (six) hours as needed for mild pain, fever or headache (or Fever >/= 101).   HYDROcodone-acetaminophen (NORCO/VICODIN) 5-325 MG tablet Take 1 tablet by mouth every 12 (twelve) hours as needed for moderate pain.   amLODipine (NORVASC) 10 MG tablet Take 1 tablet (10 mg total) by mouth daily.   anastrozole (ARIMIDEX) 1 MG tablet Take 1 tablet (1 mg total) by mouth daily.   apixaban (ELIQUIS) 5 MG TABS tablet Take 1 tablet (5 mg total) by mouth 2 (two) times daily. Start after you complete the initial starter pack of Eliquis   aspirin 81  MG tablet Take 1 tablet (81 mg total) by mouth daily with breakfast.   carvedilol (COREG) 12.5 MG tablet Take 1 tablet (12.5 mg total) by mouth 2 (two) times daily with a meal.   Cholecalciferol (VITAMIN D3) 125 MCG (5000 UT) CAPS TAKE 1 CAPSULE BY MOUTH ONCE DAILY (Patient taking differently: Take 1 capsule by mouth daily.)   ferrous sulfate 325 (65 FE) MG tablet Take 1 tablet (325 mg total) by mouth daily with breakfast.   furosemide (LASIX) 20 MG tablet Take 1 tablet (20 mg total) by mouth daily.   gabapentin (NEURONTIN) 100 MG capsule Take 1 capsule (100 mg total) by mouth 3 (three) times daily.   isosorbide mononitrate (IMDUR) 30 MG 24 hr tablet Take 1 tablet (30 mg total) by mouth daily.    levothyroxine (SYNTHROID) 125 MCG tablet Take 1 tablet (125 mcg total) by mouth daily before breakfast.   nitroGLYCERIN (NITROSTAT) 0.4 MG SL tablet Place 1 tablet (0.4 mg total) under the tongue every 5 (five) minutes x 3 doses as needed.   Omega-3 Fatty Acids 300 MG CAPS Take 1 capsule (300 mg total) by mouth 2 (two) times daily.   palbociclib (IBRANCE) 100 MG tablet Take 1 tablet (100 mg total) by mouth daily. Take for 21 days on, 7 days off, repeat every 28 days.   pantoprazole (PROTONIX) 40 MG tablet Take 1 tablet (40 mg total) by mouth daily.   potassium chloride (KLOR-CON) 10 MEQ tablet Take 1 tablet (10 mEq total) by mouth daily.   rosuvastatin (CRESTOR) 40 MG tablet Take 1 tablet (40 mg total) by mouth daily.   senna-docusate (SENOKOT-S) 8.6-50 MG tablet Take 2 tablets by mouth at bedtime.   [DISCONTINUED] potassium chloride (KLOR-CON) 10 MEQ tablet Take 1 tablet (10 mEq total) by mouth daily. Take While taking Lasix/furosemide   [DISCONTINUED] promethazine-dextromethorphan (PROMETHAZINE-DM) 6.25-15 MG/5ML syrup Take 5 mLs by mouth every 6 (six) hours as needed for cough.   No facility-administered encounter medications on file as of 08/09/2020.   ALLERGIES: Allergies  Allergen Reactions   Codeine Nausea And Vomiting   Tramadol     VACCINATION STATUS: Immunization History  Administered Date(s) Administered   Fluad Quad(high Dose 65+) 10/12/2018, 11/04/2019   Influenza, High Dose Seasonal PF 11/17/2016, 12/28/2017   Influenza,inj,Quad PF,6+ Mos 10/31/2014, 11/20/2015   Moderna Sars-Covid-2 Vaccination 06/10/2019, 07/08/2019   Pneumococcal Conjugate-13 12/01/2012    Thyroid Problem Presents for follow-up visit. Symptoms include fatigue. Patient reports no anxiety, cold intolerance, constipation, depressed mood, diarrhea, heat intolerance, palpitations, tremors, weight gain or weight loss. The symptoms have been stable.  - 80-yr old female patient with medical history as above.  She is being seen in follow-up for hypothyroidism with repeat thyroid function test.   -she is currently on Synthroid 125 mcg p.o. every morning.  She has no complaints today.  - She denies heat/cold intolerance. She denies palpitations nor tremors.   She denies any history of goiter. She has family history of thyroid dysfunction in one of her daughters. She denies any family history of thyroid cancer. -She reports compliance to her thyroid hormone.   Review of systems  Constitutional: + Minimally fluctuating body weight,  current Body mass index is 34.77 kg/m. , + fatigue, no subjective hyperthermia, no subjective hypothermia Eyes: no blurry vision, no xerophthalmia ENT: no sore throat, no nodules palpated in throat, no dysphagia/odynophagia, no hoarseness Cardiovascular: no chest pain, no shortness of breath, no palpitations, no leg swelling Respiratory: no cough, no shortness  of breath Gastrointestinal: no nausea/vomiting/diarrhea Musculoskeletal: no muscle/joint aches, walks with cane and assistance from daughter Skin: no rashes, no hyperemia, swelling to left upper extremity (started over weekend) Neurological: no tremors, no numbness, no tingling, no dizziness Psychiatric: no depression, no anxiety  Objective:    BP (!) 141/67   Pulse (!) 58   Ht 5\' 6"  (1.676 m)   Wt 215 lb 6.4 oz (97.7 kg)   BMI 34.77 kg/m   Wt Readings from Last 3 Encounters:  08/09/20 215 lb 6.4 oz (97.7 kg)  08/07/20 216 lb 9.6 oz (98.2 kg)  07/19/20 214 lb (97.1 kg)    BP Readings from Last 3 Encounters:  08/09/20 (!) 141/67  08/07/20 (!) 144/55  07/19/20 130/80   Physical Exam- Limited  Constitutional:  Body mass index is 34.77 kg/m. , not in acute distress, normal state of mind Eyes:  EOMI, no exophthalmos Neck: Supple Cardiovascular: RRR, no murmurs, rubs, or gallops, + edema LUE Respiratory: Adequate breathing efforts, no crackles, rales, rhonchi, or wheezing Musculoskeletal: no gross  deformities, strength intact in all four extremities, no gross restriction of joint movements, walks with cane and assistance from daughter Skin:  no rashes, no hyperemia Neurological: no tremor with outstretched hands    Recent Results (from the past 2160 hour(s))  Glucose, capillary     Status: Abnormal   Collection Time: 05/15/20 12:16 PM  Result Value Ref Range   Glucose-Capillary 101 (H) 70 - 99 mg/dL    Comment: Glucose reference range applies only to samples taken after fasting for at least 8 hours.  CBC with Differential     Status: Abnormal   Collection Time: 06/26/20  8:54 AM  Result Value Ref Range   WBC 6.6 4.0 - 10.5 K/uL   RBC 3.90 3.87 - 5.11 MIL/uL   Hemoglobin 10.3 (L) 12.0 - 15.0 g/dL   HCT 33.5 (L) 36.0 - 46.0 %   MCV 85.9 80.0 - 100.0 fL   MCH 26.4 26.0 - 34.0 pg   MCHC 30.7 30.0 - 36.0 g/dL   RDW 17.1 (H) 11.5 - 15.5 %   Platelets 159 150 - 400 K/uL   nRBC 0.0 0.0 - 0.2 %   Neutrophils Relative % 76 %   Neutro Abs 5.0 1.7 - 7.7 K/uL   Lymphocytes Relative 13 %   Lymphs Abs 0.8 0.7 - 4.0 K/uL   Monocytes Relative 9 %   Monocytes Absolute 0.6 0.1 - 1.0 K/uL   Eosinophils Relative 2 %   Eosinophils Absolute 0.2 0.0 - 0.5 K/uL   Basophils Relative 0 %   Basophils Absolute 0.0 0.0 - 0.1 K/uL   Immature Granulocytes 0 %   Abs Immature Granulocytes 0.02 0.00 - 0.07 K/uL    Comment: Performed at [facility], [address] [place], [place] [address]  Comprehensive metabolic panel     Status: Abnormal   Collection Time: 06/26/20  8:54 AM  Result Value Ref Range   Sodium 139 135 - 145 mmol/L   Potassium 3.4 (L) 3.5 - 5.1 mmol/L   Chloride 107 98 - 111 mmol/L   CO2 25 22 - 32 mmol/L   Glucose, Bld 100 (H) 70 - 99 mg/dL    Comment: Glucose reference range applies only to samples taken after fasting for at least 8 hours.   BUN 20 8 - 23 mg/dL   Creatinine, Ser 1.10 (H) 0.44 - 1.00 mg/dL   Calcium 9.6 8.9 - 10.3 mg/dL   Total Protein  7.3 6.5 - 8.1 g/dL    Albumin 3.6 3.5 - 5.0 g/dL   AST 19 15 - 41 U/L   ALT 13 0 - 44 U/L   Alkaline Phosphatase 213 (H) 38 - 126 U/L   Total Bilirubin 0.8 0.3 - 1.2 mg/dL   GFR, Estimated 50 (L) >60 mL/min    Comment: (NOTE) Calculated using the CKD-EPI Creatinine Equation (2021)    Anion gap 7 5 - 15    Comment: Performed at [facility], [address], [place], [place] [address]  Cancer antigen 27.29     Status: Abnormal   Collection Time: 06/26/20  8:54 AM  Result Value Ref Range   CA 27.29 56.3 (H) 0.0 - 38.6 U/mL    Comment: (NOTE) Siemens Centaur Immunochemiluminometric Methodology (ICMA) Values obtained with different assay methods or kits cannot be used interchangeably. Results cannot be interpreted as absolute evidence of the presence or absence of malignant disease. Performed At: [facility] [place], [place] [id number] Rush Farmer MD [id number]   Cancer antigen 15-3     Status: Abnormal   Collection Time: 06/26/20  8:54 AM  Result Value Ref Range   CA 15-3 40.2 (H) 0.0 - 25.0 U/mL    Comment: (NOTE) Roche Diagnostics Electrochemiluminescence Immunoassay (ECLIA) Values obtained with different assay methods or kits cannot be used interchangeably.  Results cannot be interpreted as absolute evidence of the presence or absence of malignant disease. Performed At: [facility] [place], [place] [id number] Rush Farmer MD [id number]   Magnesium     Status: None   Collection Time: 06/26/20  8:54 AM  Result Value Ref Range   Magnesium 2.1 1.7 - 2.4 mg/dL    Comment: Performed at [facility], [address] [place], [place] [address]  TSH     Status: Abnormal   Collection Time: 07/19/20  4:50 PM  Result Value Ref Range   TSH 0.173 (L) 0.450 - 4.500 uIU/mL  T4, free     Status: Abnormal   Collection Time: 07/19/20  4:50 PM  Result Value Ref Range   Free T4 1.91 (H) 0.82 - 1.77 ng/dL  VITAMIN D 25 Hydroxy (Vit-D Deficiency,  Fractures)     Status: None   Collection Time: 08/07/20  8:49 AM  Result Value Ref Range   Vit D, 25-Hydroxy 56.68 30 - 100 ng/mL    Comment: (NOTE) Vitamin D deficiency has been defined by the Institute of Medicine  and an Endocrine Society practice guideline as a level of serum 25-OH  vitamin D less than 20 ng/mL (1,2). The Endocrine Society went on to  further define vitamin D insufficiency as a level between 21 and 29  ng/mL (2).  1. IOM (Institute of Medicine). 2010. Dietary reference intakes for  calcium and D. [place]: The [employer]. 2. Holick MF, Binkley Manson, Bischoff-Ferrari HA, et al. Evaluation,  treatment, and prevention of vitamin D deficiency: an Endocrine  Society clinical practice guideline, JCEM. 2011 Jul; 96(7): 1911-30.  Performed at [facility], [place] [address] [place], [place] [address]   Cancer antigen 27.29     Status: None   Collection Time: 08/07/20  8:49 AM  Result Value Ref Range   CA 27.29 33.5 0.0 - 38.6 U/mL    Comment: (NOTE) Siemens Centaur Immunochemiluminometric Methodology (ICMA) Values obtained with different assay methods or kits cannot be used interchangeably. Results cannot be interpreted as absolute evidence of the presence or  absence of malignant disease. Performed At: [facility] [place], [place] [id number] Rush Farmer MD [id number]   Cancer antigen 15-3     Status: Abnormal   Collection Time: 08/07/20  8:49 AM  Result Value Ref Range   CA 15-3 33.6 (H) 0.0 - 25.0 U/mL    Comment: (NOTE) Roche Diagnostics Electrochemiluminescence Immunoassay (ECLIA) Values obtained with different assay methods or kits cannot be used interchangeably.  Results cannot be interpreted as absolute evidence of the presence or absence of malignant disease. Performed At: [facility] [place], [place] [id number] Rush Farmer MD [id number]   Comprehensive metabolic panel      Status: Abnormal   Collection Time: 08/07/20  8:49 AM  Result Value Ref Range   Sodium 137 135 - 145 mmol/L   Potassium 3.5 3.5 - 5.1 mmol/L   Chloride 108 98 - 111 mmol/L   CO2 21 (L) 22 - 32 mmol/L   Glucose, Bld 110 (H) 70 - 99 mg/dL    Comment: Glucose reference range applies only to samples taken after fasting for at least 8 hours.   BUN 21 8 - 23 mg/dL   Creatinine, Ser 1.37 (H) 0.44 - 1.00 mg/dL   Calcium 8.8 (L) 8.9 - 10.3 mg/dL   Total Protein 7.5 6.5 - 8.1 g/dL   Albumin 3.9 3.5 - 5.0 g/dL   AST 17 15 - 41 U/L   ALT 12 0 - 44 U/L   Alkaline Phosphatase 98 38 - 126 U/L   Total Bilirubin 0.4 0.3 - 1.2 mg/dL   GFR, Estimated 39 (L) >60 mL/min    Comment: (NOTE) Calculated using the CKD-EPI Creatinine Equation (2021)    Anion gap 8 5 - 15    Comment: Performed at [facility], [address], [place], [place] [address]  CBC with Differential/Platelet     Status: Abnormal   Collection Time: 08/07/20  8:49 AM  Result Value Ref Range   WBC 7.2 4.0 - 10.5 K/uL   RBC 4.54 3.87 - 5.11 MIL/uL   Hemoglobin 11.4 (L) 12.0 - 15.0 g/dL   HCT 38.2 36.0 - 46.0 %   MCV 84.1 80.0 - 100.0 fL   MCH 25.1 (L) 26.0 - 34.0 pg   MCHC 29.8 (L) 30.0 - 36.0 g/dL   RDW 16.2 (H) 11.5 - 15.5 %   Platelets 168 150 - 400 K/uL   nRBC 0.0 0.0 - 0.2 %   Neutrophils Relative % 76 %   Neutro Abs 5.4 1.7 - 7.7 K/uL   Lymphocytes Relative 14 %   Lymphs Abs 1.0 0.7 - 4.0 K/uL   Monocytes Relative 8 %   Monocytes Absolute 0.6 0.1 - 1.0 K/uL   Eosinophils Relative 2 %   Eosinophils Absolute 0.1 0.0 - 0.5 K/uL   Basophils Relative 0 %   Basophils Absolute 0.0 0.0 - 0.1 K/uL   Immature Granulocytes 0 %   Abs Immature Granulocytes 0.03 0.00 - 0.07 K/uL    Comment: Performed at [facility], [address], [place], [place] [address]      Diabetic Labs (most recent): Lab Results  Component Value Date   HGBA1C 5.9 (H) 03/01/2020   HGBA1C 5.8 (H) 11/04/2019   HGBA1C 5.8 (H) 05/18/2019   Results  for Robin [name], Robin [name] (MRN [id number]) as of 04/04/2020 11:12  Ref. Range 12/22/2019 08:31 03/01/2020 09:04 03/01/2020 10:08 03/13/2020 07:50 03/29/2020 14:16  TSH Latest Ref Range: 0.450 - 4.500 uIU/mL 16.000 (H)  10.600 (H)  T4,Free(Direct) Latest Ref Range: 0.82 - 1.77 ng/dL 0.73 (L)    0.90      Assessment & Plan:   1. Hypothyroidism -Her previsit thyroid function tests are consistent with appropriate hormone replacement (borderline high) and she has no symptoms.  She is advised to continue her current dose of Levothyroxine 125 mcg po daily before breakfast.    - We discussed about correct intake of levothyroxine, at fasting, with water, separated by at least 30 minutes from breakfast, and separated by more than 4 hours from calcium, iron, multivitamins, acid reflux medications (PPIs). -Patient is made aware of the fact that thyroid hormone replacement is needed for life, dose to be adjusted by periodic monitoring of thyroid function tests.    2. HTN Her blood pressure is controlled to target for her age.  She is advised to continue Norvasc 10 mg po daily. Lasix 20 mg po daily, and Carvedilol 12.5 mg po twice daily.       I spent 25 minutes in the care of the patient today including review of labs from Thyroid Function, CMP, and other relevant labs ; imaging/biopsy records (current and previous including abstractions from other facilities); face-to-face time discussing  her lab results and symptoms, medications doses, her options of short and long term treatment based on the latest standards of care / guidelines;   and documenting the encounter.  Robin [name]  participated in the discussions, expressed understanding, and voiced agreement with the above plans.  All questions were answered to her satisfaction. she is encouraged to contact clinic should she have any questions or concerns prior to her return visit.  Follow up plan: No follow-ups on file.  Rayetta Pigg,  [facility] [address] [place], [place] [address] Phone: [phone] Fax: [phone]   08/09/2020, 2:22 PM

## 2020-08-13 ENCOUNTER — Other Ambulatory Visit (HOSPITAL_COMMUNITY): Payer: Self-pay

## 2020-08-13 DIAGNOSIS — C7951 Secondary malignant neoplasm of bone: Secondary | ICD-10-CM

## 2020-08-13 DIAGNOSIS — Z170 Estrogen receptor positive status [ER+]: Secondary | ICD-10-CM

## 2020-08-13 DIAGNOSIS — C50912 Malignant neoplasm of unspecified site of left female breast: Secondary | ICD-10-CM

## 2020-08-15 ENCOUNTER — Ambulatory Visit: Payer: Medicare PPO | Admitting: Cardiology

## 2020-08-15 NOTE — Progress Notes (Deleted)
Cardiology Office Note  Date: 08/15/2020   ID: Robin, [name] June 25, 1940, MRN [id number]  PCP:  Robin Spar, MD  Cardiologist:  Robin Lesches, MD Electrophysiologist:  None   No chief complaint on file.   History of Present Illness: Robin [name] is an 80 y.o. female last assessed via telehealth encounter in April by Mr. Robin Sake NP.  Echocardiogram in April revealed LVEF 60 to 65% with mild diastolic dysfunction, normal RV contraction, and sclerotic aortic valve without stenosis.  Past Medical History:  Diagnosis Date   Breast cancer ([place])    Tamoxifen started 2007   CAD (coronary artery disease)    DES second diagonal 2/12   Cardiomyopathy    Probable Takotsubo, LVEF 30-35% 2/12   Carotid artery disease (H[facility])    Essential hypertension    Hepatic steatosis    Hyperlipidemia    Hypothyroidism    NSTEMI (non-ST elevated myocardial infarction) ([place])    2/12   Pancreas divisum    Stroke ([place])    (2000) residual left-sided weakness   Type 2 diabetes mellitus ([place])     Past Surgical History:  Procedure Laterality Date   ABDOMINAL HYSTERECTOMY     CAROTID ENDARTERECTOMY     Left   CHOLECYSTECTOMY     COLONOSCOPY WITH PROPOFOL N/A 05/03/2020   Procedure: COLONOSCOPY WITH PROPOFOL;  Surgeon: Robin Dolin, MD;  [facility];  Service: Endoscopy;  Laterality: N/A;   ESOPHAGOGASTRODUODENOSCOPY (EGD) WITH PROPOFOL N/A 05/02/2020   Procedure: ESOPHAGOGASTRODUODENOSCOPY (EGD) WITH PROPOFOL;  Surgeon: Robin Houston, MD;  [facility];  Service: Endoscopy;  Laterality: N/A;   GIVENS CAPSULE STUDY N/A 05/04/2020   Procedure: GIVENS CAPSULE STUDY;  Surgeon: Robin Quale, MD;  [facility];  Service: Gastroenterology;  Laterality: N/A;   JOINT REPLACEMENT     [name]   MASTECTOMY     Bilateral   POLYPECTOMY  05/03/2020   Procedure: POLYPECTOMY;  Surgeon: Robin Dolin, MD;   [facility];  Service: Endoscopy;;    Current Outpatient Medications  Medication Sig Dispense Refill   acetaminophen (TYLENOL) 325 MG tablet Take 2 tablets (650 mg total) by mouth every 6 (six) hours as needed for mild pain, fever or headache (or Fever >/= 101). 12 tablet 0   amLODipine (NORVASC) 10 MG tablet Take 1 tablet (10 mg total) by mouth daily. 90 tablet 3   anastrozole (ARIMIDEX) 1 MG tablet Take 1 tablet (1 mg total) by mouth daily. 30 tablet 3   apixaban (ELIQUIS) 5 MG TABS tablet Take 1 tablet (5 mg total) by mouth 2 (two) times daily. Start after you complete the initial starter pack of Eliquis 60 tablet 5   aspirin 81 MG tablet Take 1 tablet (81 mg total) by mouth daily with breakfast. 30 tablet 3   carvedilol (COREG) 12.5 MG tablet Take 1 tablet (12.5 mg total) by mouth 2 (two) times daily with a meal. 90 tablet 3   Cholecalciferol (VITAMIN D3) 125 MCG (5000 UT) CAPS TAKE 1 CAPSULE BY MOUTH ONCE DAILY (Patient taking differently: Take 1 capsule by mouth daily.) 90 capsule 1   ferrous sulfate 325 (65 FE) MG tablet Take 1 tablet (325 mg total) by mouth daily with breakfast. 30 tablet 0   furosemide (LASIX) 20 MG tablet Take 1 tablet (20 mg total) by mouth daily. 90 tablet 1   gabapentin (NEURONTIN) 100 MG capsule Take  1 capsule (100 mg total) by mouth 3 (three) times daily. 90 capsule 2   HYDROcodone-acetaminophen (NORCO/VICODIN) 5-325 MG tablet Take 1 tablet by mouth every 12 (twelve) hours as needed for moderate pain. 20 tablet 0   isosorbide mononitrate (IMDUR) 30 MG 24 hr tablet Take 1 tablet (30 mg total) by mouth daily. 90 tablet 2   levothyroxine (SYNTHROID) 125 MCG tablet Take 1 tablet (125 mcg total) by mouth daily before breakfast. 90 tablet 1   nitroGLYCERIN (NITROSTAT) 0.4 MG SL tablet Place 1 tablet (0.4 mg total) under the tongue every 5 (five) minutes x 3 doses as needed. 25 tablet 3   Omega-3 Fatty Acids 300 MG CAPS Take 1 capsule (300 mg total) by mouth 2  (two) times daily.     palbociclib (IBRANCE) 100 MG tablet Take 1 tablet (100 mg total) by mouth daily. Take for 21 days on, 7 days off, repeat every 28 days. 21 tablet 0   pantoprazole (PROTONIX) 40 MG tablet Take 1 tablet (40 mg total) by mouth daily. 30 tablet 3   potassium chloride (KLOR-CON) 10 MEQ tablet Take 1 tablet (10 mEq total) by mouth daily. 90 tablet 1   rosuvastatin (CRESTOR) 40 MG tablet Take 1 tablet (40 mg total) by mouth daily. 90 tablet 2   senna-docusate (SENOKOT-S) 8.6-50 MG tablet Take 2 tablets by mouth at bedtime. 60 tablet 1   No current facility-administered medications for this visit.   Allergies:  Codeine and Tramadol   Social History: The patient  reports that she has never smoked. She has never used smokeless tobacco. She reports that she does not drink alcohol and does not use drugs.   Family History: The patient's family history includes Heart attack in her brother and sister; Heart disease in her brother and sister; Throat cancer in her brother.   ROS:  Please see the history of present illness. Otherwise, complete review of systems is positive for {NONE DEFAULTED:18576}.  All other systems are reviewed and negative.   Physical Exam: VS:  There were no vitals taken for this visit., BMI There is no height or weight on file to calculate BMI.  Wt Readings from Last 3 Encounters:  08/09/20 215 lb 6.4 oz (97.7 kg)  08/07/20 216 lb 9.6 oz (98.2 kg)  07/19/20 214 lb (97.1 kg)    General: Patient appears comfortable at rest. HEENT: Conjunctiva and lids normal, oropharynx clear with moist mucosa. Neck: Supple, no elevated JVP or carotid bruits, no thyromegaly. Lungs: Clear to auscultation, nonlabored breathing at rest. Cardiac: Regular rate and rhythm, no S3 or significant systolic murmur, no pericardial rub. Abdomen: Soft, nontender, no hepatomegaly, bowel sounds present, no guarding or rebound. Extremities: No pitting edema, distal pulses 2+. Skin: Warm and  dry. Musculoskeletal: No kyphosis. Neuropsychiatric: Alert and oriented x3, affect grossly appropriate.  ECG:  An ECG dated 05/01/2020 was personally reviewed today and demonstrated:  Sinus rhythm with PAC and left atrial enlargement.  Recent Labwork: 04/04/2020: B Natriuretic Peptide 92.0 06/26/2020: Magnesium 2.1 07/19/2020: TSH 0.173 08/07/2020: ALT 12; AST 17; BUN 21; Creatinine, Ser 1.37; Hemoglobin 11.4; Platelets 168; Potassium 3.5; Sodium 137     Component Value Date/Time   CHOL 145 03/01/2020 1008   TRIG 109 03/01/2020 1008   HDL 46 (L) 03/01/2020 1008   CHOLHDL 3.2 03/01/2020 1008   VLDL 20 04/08/2016 0910   LDLCALC 80 03/01/2020 1008    Other Studies Reviewed Today:  Echocardiogram 05/02/2020:  1. Left ventricular ejection fraction, by  estimation, is 60 to 65%. The  left ventricle has normal function. The left ventricle has no regional  wall motion abnormalities. Left ventricular diastolic parameters are  consistent with Grade I diastolic  dysfunction (impaired relaxation).   2. Right ventricular systolic function is normal. The right ventricular  size is normal.   3. Left atrial size was mildly dilated.   4. The mitral valve is abnormal. No evidence of mitral valve  regurgitation. No evidence of mitral stenosis. Moderate mitral annular  calcification.   5. The aortic valve is tricuspid. There is mild calcification of the  aortic valve. Aortic valve regurgitation is not visualized. Mild to  moderate aortic valve sclerosis/calcification is present, without any  evidence of aortic stenosis.   6. The inferior vena cava is normal in size with greater than 50%  respiratory variability, suggesting right atrial pressure of 3 mmHg.   Assessment and Plan:    Medication Adjustments/Labs and Tests Ordered: Current medicines are reviewed at length with the patient today.  Concerns regarding medicines are outlined above.   Tests Ordered: No orders of the defined types were  placed in this encounter.   Medication Changes: No orders of the defined types were placed in this encounter.   Disposition:  Follow up {follow up:15908}  Signed, Satira Sark, MD, [facility] 08/15/2020 12:38 PM    [facility] at [place], [place], [place] [address] Phone: [phone]; Fax: [phone]

## 2020-08-16 ENCOUNTER — Encounter: Payer: Self-pay | Admitting: Cardiology

## 2020-08-18 ENCOUNTER — Other Ambulatory Visit: Payer: Self-pay

## 2020-08-18 DIAGNOSIS — M7989 Other specified soft tissue disorders: Secondary | ICD-10-CM

## 2020-08-20 NOTE — Telephone Encounter (Signed)
Patient scheduled for 08/29/20 @ 2:00 PM

## 2020-08-24 ENCOUNTER — Other Ambulatory Visit: Payer: Self-pay | Admitting: Orthopedic Surgery

## 2020-08-24 DIAGNOSIS — M4722 Other spondylosis with radiculopathy, cervical region: Secondary | ICD-10-CM

## 2020-08-29 ENCOUNTER — Encounter (HOSPITAL_COMMUNITY): Payer: Medicare PPO

## 2020-09-06 ENCOUNTER — Other Ambulatory Visit (HOSPITAL_COMMUNITY): Payer: Self-pay | Admitting: Hematology

## 2020-09-10 ENCOUNTER — Other Ambulatory Visit (HOSPITAL_COMMUNITY): Payer: Medicare PPO

## 2020-09-10 ENCOUNTER — Inpatient Hospital Stay (HOSPITAL_COMMUNITY): Payer: Medicare PPO | Attending: Hematology

## 2020-09-10 ENCOUNTER — Ambulatory Visit (HOSPITAL_COMMUNITY): Payer: Medicare PPO

## 2020-09-12 NOTE — Progress Notes (Signed)
Cardiology Office Note  Date: 09/13/2020   ID: Ariza, Evans 1940/05/31, MRN [id number]  PCP:  [name], MD  Cardiologist:  Rozann Lesches, MD Electrophysiologist:  None   Chief Complaint: 54-monthfollow-up  History of Present Illness: MTABITA CORBOis a 80y.o. female with a history of cardiomyopathy, CAD, carotid artery disease, HTN, HLD, hypothyroidism, hepatic steatosis, breast CA, CVA, DM      Recent hospital admission for acute GI bleeding on 05/01/2020.  She had an initial hemoglobin of 7.2.  Admitted for chest pain and dark stool but per GI there was no indication of active or previous bleeding via capsule endoscopy which was normal.  She underwent EGD showing small hiatal hernia and colonoscopy showing diverticulosis and 5 polyps in the ascending colon which were removed with cold snare and nonbleeding hemorrhoid.  She received 1 unit of packed red blood cells on 05/02/2020.  GI recommended for her to follow-up with primary care and hematology to try to find out the source of her anemia and bleeding.  Her Eliquis was restarted.  She was to continue Protonix 40 mg daily and ferrous sulfate 325 mg every day for 6 months.  According to notes she received a blood transfusion today at 10:30 AM.  Her hemoglobin and hematocrit on 05/05/2020 were 8.7 and 28.2 respectively.  Her losartan/HCTZ and hydralazine were stopped at discharge from recent hospital visit.  Blood pressure today 114/68, heart rate of 61.  Spoke to her by phone.  She was taking Lasix regularly which was prescribed 20 mg daily at discharge from recent hospital admission.  He stated DVT in her left arm had improved.  She denies any obvious bleeding in stool or urine.  She has had a recent recurrence of breast CA with bone metastasis per hospital note.  She is following oncology/hematology.  She is here for 378-monthollow-up today and she states she is doing very well.  She is seeing oncology for recurrent breast  cancer with bone metastases.  She saw Dr. DoLorenso [facility]ouriert onc[facility] center on 08/08/2019.  Dr. DoLorenso Couriergreed with Dr. KaTomie Chinaecommendation of first-line treatment with anastrozole and Ibrance.  She was currently taking anastrozole only.  She did not wish to start the palbociclib because she was tolerating the anastrozole so well.  His plan was to proceed with Xgeva injection that day and continue every 6-weeks due to her mild CKD.  Plan was to start denosumab infusion she denies any anginal or exertional symptoms, DOE or SOB.  Denies any PND orthopnea.  Her DVT of left upper extremity is much better she denies any swelling or pain.  Blood pressure is well controlled today at 124/70.  Heart rate is 66.  Current cardiac regimen includes; aspirin 81 mg daily, amlodipine 10 mg daily, carvedilol 12.5 mg p.o. twice daily, Crestor 40 mg daily, Imdur 30 mg daily, Eliquis 5 mg p.o. twice daily, sublingual nitroglycerin as needed, omega-3 fatty acids 300 mg p.o. twice daily, potassium supplementation 10 mEq p.o. daily.   Past Medical History:  Diagnosis Date   [place]   Tamoxifen started 2007   CAD (coronary artery disease)    DES second diagonal 2/12   Cardiomyopathy    Probable Takotsubo, LVEF 30-35% 2/12   Car[place]id artery disease ([place]   Essential hypertension    Hepatic steatosis    Hyperlipidemia    Hypothyroidism    NSTEMI (non-ST elevated myocardial infarction) ([place]   2/12  Pancreas divisum    Stroke ([place])    (2000) residual left-sided weakness   Type 2 diabetes mellitus ([place])     Past Surgical History:  Procedure Laterality Date   ABDOMINAL HYSTERECTOMY     CAROTID ENDARTERECTOMY     Left   CHOLECYSTECTOMY     COLONOSCOPY WITH PROPOFOL N/A 05/03/2020   Procedure: COLONOSCOPY WITH PROPOFOL;  Surgeon: Daneil Dolin, MD;  [facility];  Service: Endoscopy;  Laterality: N/A;   ESOPHAGOGASTRODUODENOSCOPY (EGD) WITH PROPOFOL N/A 05/02/2020   Procedure:  ESOPHAGOGASTRODUODENOSCOPY (EGD) WITH PROPOFOL;  Surgeon: Rogene Houston, MD;  [facility];  Service: Endoscopy;  Laterality: N/A;   GIVENS CAPSULE STUDY N/A 05/04/2020   Procedure: GIVENS CAPSULE STUDY;  Surgeon: Harvel Quale, MD;  [facility];  Service: Gastroenterology;  Laterality: N/A;   JOINT REPLACEMENT     [facility]   MASTECTOMY     Bilateral   POLYPECTOMY  05/03/2020   Procedure: POLYPECTOMY;  Surgeon: Daneil Dolin, MD;  [facility];  Service: Endoscopy;;    Current Outpatient Medications  Medication Sig Dispense Refill   acetaminophen (TYLENOL) 325 MG tablet Take 2 tablets (650 mg total) by mouth every 6 (six) hours as needed for mild pain, fever or headache (or Fever >/= 101). 12 tablet 0   amLODipine (NORVASC) 10 MG tablet Take 1 tablet (10 mg total) by mouth daily. 90 tablet 3   anastrozole (ARIMIDEX) 1 MG tablet TAKE 1 TABLET BY MOUTH EVERY DAY 30 tablet 3   apixaban (ELIQUIS) 5 MG TABS tablet Take 1 tablet (5 mg total) by mouth 2 (two) times daily. Start after you complete the initial starter pack of Eliquis 60 tablet 5   aspirin 81 MG tablet Take 1 tablet (81 mg total) by mouth daily with breakfast. 30 tablet 3   carvedilol (COREG) 12.5 MG tablet Take 1 tablet (12.5 mg total) by mouth 2 (two) times daily with a meal. 90 tablet 3   Cholecalciferol (VITAMIN D3) 125 MCG (5000 UT) CAPS TAKE 1 CAPSULE BY MOUTH ONCE DAILY (Patient taking differently: Take 1 capsule by mouth daily.) 90 capsule 1   ferrous sulfate 325 (65 FE) MG tablet Take 1 tablet (325 mg total) by mouth daily with breakfast. 30 tablet 0   furosemide (LASIX) 20 MG tablet Take 1 tablet (20 mg total) by mouth daily. 90 tablet 1   gabapentin (NEURONTIN) 100 MG capsule TAKE 1 CAPSULE (100 MG TOTAL) BY MOUTH THREE TIMES DAILY. 90 capsule 2   HYDROcodone-acetaminophen (NORCO/VICODIN) 5-325 MG tablet Take 1 tablet by mouth every 12  (twelve) hours as needed for moderate pain. 20 tablet 0   isosorbide mononitrate (IMDUR) 30 MG 24 hr tablet Take 1 tablet (30 mg total) by mouth daily. 90 tablet 2   levothyroxine (SYNTHROID) 125 MCG tablet Take 1 tablet (125 mcg total) by mouth daily before breakfast. 90 tablet 1   nitroGLYCERIN (NITROSTAT) 0.4 MG SL tablet Place 1 tablet (0.4 mg total) under the tongue every 5 (five) minutes x 3 doses as needed. 25 tablet 3   Omega-3 Fatty Acids 300 MG CAPS Take 1 capsule (300 mg total) by mouth 2 (two) times daily.     palbociclib (IBRANCE) 100 MG tablet Take 1 tablet (100 mg total) by mouth daily. Take for 21 days on, 7 days off, repeat every 28 days. 21 tablet 0   pantoprazole (PROTONIX) 40 MG tablet Take  1 tablet (40 mg total) by mouth daily. 30 tablet 3   potassium chloride (KLOR-CON) 10 MEQ tablet Take 1 tablet (10 mEq total) by mouth daily. 90 tablet 1   rosuvastatin (CRESTOR) 40 MG tablet Take 1 tablet (40 mg total) by mouth daily. 90 tablet 2   senna-docusate (SENOKOT-S) 8.6-50 MG tablet Take 2 tablets by mouth at bedtime. 60 tablet 1   No current facility-administered medications for this visit.   Allergies:  Codeine and Tramadol   Social History: The patient  reports that she has never smoked. She has never used smokeless tobacco. She reports that she does not drink alcohol and does not use drugs.   Family History: The patient's family history includes Heart attack in her brother and sister; Heart disease in her brother and sister; Throat cancer in her brother.   ROS:  Please see the history of present illness. Otherwise, complete review of systems is positive for none.  All other systems are reviewed and negative.   Physical Exam: VS:  BP 124/70   Pulse 66   Ht _0  (1.676 m)   Wt 216 lb 6.4 oz (98.2 kg)   SpO2 98%   BMI 34.93 kg/m , BMI Body mass index is 34.93 kg/m.  Wt Readings from Last 3 Encounters:  09/13/20 216 lb 6.4 oz (98.2 kg)  08/09/20 215 lb 6.4 oz (97.7  kg)  08/07/20 216 lb 9.6 oz (98.2 kg)    General: Patient appears comfortable at rest. Neck: Supple, no elevated JVP or carotid bruits, no thyromegaly. Lungs: Clear to auscultation, nonlabored breathing at rest. Cardiac: Regular rate and rhythm, no S3 or significant systolic murmur, no pericardial rub. Extremities: No pitting edema, distal pulses 2+. Skin: Warm and dry. Musculoskeletal: No kyphosis. Neuropsychiatric: Alert and oriented x3, affect grossly appropriate.  ECG:  EKG 05/01/2020 [place] [facility], ED sinus rhythm with a rate of 80, atrial premature complex, probable left atrial enlargement.  Recent Labwork: 04/04/2020: B Natriuretic Peptide 92.0 06/26/2020: Magnesium 2.1 07/19/2020: TSH 0.173 08/07/2020: ALT 12; AST 17; BUN 21; Creatinine, Ser 1.37; Hemoglobin 11.4; Platelets 168; Potassium 3.5; Sodium 137     Component Value Date/Time   CHOL 145 03/01/2020 1008   TRIG 109 03/01/2020 1008   HDL 46 (L) 03/01/2020 1008   CHOLHDL 3.2 03/01/2020 1008   VLDL 20 04/08/2016 0910   LDLCALC 80 03/01/2020 1008    Other Studies Reviewed Today:   Echocardiogram 05/02/2020  1. Left ventricular ejection fraction, by estimation, is 60 to 65%. The  left ventricle has normal function. The left ventricle has no regional  wall motion abnormalities. Left ventricular diastolic parameters are  consistent with Grade I diastolic  dysfunction (impaired relaxation).   2. Right ventricular systolic function is normal. The right ventricular  size is normal.   3. Left atrial size was mildly dilated.   4. The mitral valve is abnormal. No evidence of mitral valve  regurgitation. No evidence of mitral stenosis. Moderate mitral annular  calcification.   5. The aortic valve is tricuspid. There is mild calcification of the  aortic valve. Aortic valve regurgitation is not visualized. Mild to  moderate aortic valve sclerosis/calcification is present, without any  evidence of aortic stenosis.   6. The inferior  vena cava is normal in size with greater than 50%  respiratory variability, suggesting right atrial pressure of 3 mmHg.          CT angio chest per PE protocol 04/04/2020 IMPRESSION: 1. Limited study demonstrating  no central, lobar, or proximal segmental sized pulmonary embolism. Smaller distal segmental and subsegmental sized emboli cannot be entirely excluded. 2. Mild cardiomegaly with evidence of mild interstitial pulmonary edema in the lungs; imaging findings suggestive of congestive heart failure. 3. Irregular masslike area in the left chest wall concerning for potential recurrent breast neoplasm measuring approximately 4.5 x 3.1 cm. There also lytic lesions in T1 and T3 vertebral bodies as well as the manubrium. The possibility of metastatic disease should be considered. Further evaluation with PET-CT should be considered. 4. Aortic atherosclerosis, in addition to left main and 3 vessel coronary artery disease.   Aortic Atherosclerosis (ICD10-I70.0).     Echocardiogram 10/27/2019  1. Left ventricular ejection fraction, by estimation, is 60 to 65%. The left ventricle has normal function. The left ventricle has no regional wall motion abnormalities. There is mild left ventricular hypertrophy. Left ventricular diastolic parameters are consistent with Grade I diastolic dysfunction (impaired relaxation). Elevated left atrial pressure. 2. Right ventricular systolic function is normal. The right ventricular size is normal. 3. Left atrial size was mildly dilated. 4. The mitral valve is normal in structure. Trivial mitral valve regurgitation. No evidence of mitral stenosis. 5. The aortic valve is tricuspid. There is mild calcification of the aortic valve. There is mild thickening of the aortic valve. Aortic valve regurgitation is not visualized. No aortic stenosis is present. 6. The inferior vena cava is normal in size with greater than 50% respiratory variability, suggesting right  atrial pressure of 3 mmHg. Comparison(s): Echocardiogram done 04/27/15 showed an EF of 60-65%.     Echocardiogram 04/27/2015: Study Conclusions   - Left ventricle: The cavity size was normal. Wall thickness was   increased in a pattern of severe LVH. Systolic function was   normal. The estimated ejection fraction was in the range of 60%   to 65%. Wall motion was normal; there were no regional wall   motion abnormalities. Doppler parameters are consistent with   abnormal left ventricular relaxation (grade 1 diastolic   dysfunction). Doppler parameters are consistent with high   ventricular filling pressure. - Aortic valve: Mildly calcified annulus. Trileaflet; mildly   calcified leaflets. - Mitral valve: Calcified annulus. There was trivial regurgitation. - Left atrium: The atrium was mildly dilated. - Right atrium: Central venous pressure (est): 3 mm Hg. - Tricuspid valve: There was trivial regurgitation. - Pulmonary arteries: PA peak pressure: 26 mm Hg (S). - Pericardium, extracardiac: There was no pericardial effusion.   Impressions:   - Severe LVH with LVEF 60-65%. Grade 1 diastolic dysfunction with   increased LV filling pressures. Mild left atrial enlargement. MAC   with trivial mitral regurgitation. Mildly sclerotic aortic valve.   Trivial tricuspid regurgitation with PASP 26 mmHg mmHg.    Assessment and Plan:  1. Secondary cardiomyopathy ([place])   2. Essential hypertension, benign   3. CAD in native artery   4. Bilateral carotid artery disease, unspecified type ([place])   5. Acute deep vein thrombosis (DVT) of other vein of left upper extremity (HCC)   6. SOB (shortness of breath)   7. Malignant neoplasm of left breast in female, estrogen receptor positive, unspecified site of breast ([place])    1. Secondary cardiomyopathy ([place]) Recent echocardiogram performed due to continuing dyspnea on 05/02/2020 demonstrated EF is 60 to 65%.  No WMA's.  G1 DD.  Mildly dilated LA.  Moderate  mitral annular calcification.   Denies any DOE or SOB, PND, orthopnea, lower extremity edema.  Weight is steady at  216.  Continue Lasix 20 mg daily with potassium supplementation.     2. Essential hypertension, benign Blood pressure today 124/70   Continue amlodipine 10 mg daily.  Continue carvedilol 12.5 mg p.o. twice daily.   3. CAD in native artery No anginal or exertional symptoms or use of nitroglycerin.  Continue aspirin 81 mg daily.  Continue nitroglycerin 0.4 mg sublingual as needed.  Continue Crestor 40 mg daily.   4.  Carotid artery disease. Patient has bilateral carotid artery bruits. Previous carotid artery study 2018 heterogeneous plaque with shadowing bilaterally.  1 to 39% ICA stenosis bilaterally.  Greater than 50% stenosis of the right ECA.  Patent left endarterectomy site.  Normal subclavian arteries, bilaterally.  Patent vertebral arteries with antegrade flow.   She has no neurological symptoms.  No CVA or TIA-like symptoms.   5. DVT left upper extremity Recent DVT of left upper extremity.  She states today the DVT in her left upper arm looks much better and she is continuing Eliquis without any GI bleeding noted after recent hospital admission for GI bleed.   6. Breast Cancer left breast / Recurrent Has a recurrence of left breast CA. per recent hospital notes she has recurrent breast CA with bone metastasis followed by oncology/hematology.  She is seeing Dr. Delton Coombes and Dr. Lorenso Courier for treatment.  Currently on anastrozole and Xgeva injections. She did not wish to start the palbociclib because she was tolerating the anastrozole so well.   7.  Shortness of breath Recent echocardiogram performed due to continuing dyspnea on 05/02/2020 demonstrated EF is 60 to 65%.  No WMA's.   G1 DD.  Mildly dilated LA.  Moderate mitral annular calcification.  Currently denies any significant shortness of breath.  Medication Adjustments/Labs and Tests Ordered: Current medicines are reviewed  at length with the patient today.  Concerns regarding medicines are outlined above.   Disposition: Follow-up with [name] or APP 6 months  Signed, Levell July, NP 09/13/2020 2:38 PM    [facility] at [place], [place],  [address] Phone: [phone]; Fax: [phone]

## 2020-09-13 ENCOUNTER — Ambulatory Visit: Payer: Medicare PPO | Admitting: Family Medicine

## 2020-09-13 ENCOUNTER — Encounter: Payer: Self-pay | Admitting: Family Medicine

## 2020-09-13 VITALS — BP 124/70 | HR 66 | Ht 66.0 in | Wt 216.4 lb

## 2020-09-13 DIAGNOSIS — Z170 Estrogen receptor positive status [ER+]: Secondary | ICD-10-CM

## 2020-09-13 DIAGNOSIS — R0602 Shortness of breath: Secondary | ICD-10-CM

## 2020-09-13 DIAGNOSIS — I10 Essential (primary) hypertension: Secondary | ICD-10-CM

## 2020-09-13 DIAGNOSIS — I2510 Atherosclerotic heart disease of native coronary artery without angina pectoris: Secondary | ICD-10-CM

## 2020-09-13 DIAGNOSIS — I429 Cardiomyopathy, unspecified: Secondary | ICD-10-CM | POA: Diagnosis not present

## 2020-09-13 DIAGNOSIS — I82622 Acute embolism and thrombosis of deep veins of left upper extremity: Secondary | ICD-10-CM | POA: Diagnosis not present

## 2020-09-13 DIAGNOSIS — I779 Disorder of arteries and arterioles, unspecified: Secondary | ICD-10-CM

## 2020-09-13 DIAGNOSIS — C50912 Malignant neoplasm of unspecified site of left female breast: Secondary | ICD-10-CM

## 2020-09-13 NOTE — Patient Instructions (Addendum)
Medication Instructions:  Continue all current medications.   Labwork: none  Testing/Procedures: none  Follow-Up: 6 months   Any Other Special Instructions Will Be Listed Below (If Applicable).   If you need a refill on your cardiac medications before your next appointment, please call your pharmacy.  

## 2020-09-17 ENCOUNTER — Ambulatory Visit (HOSPITAL_COMMUNITY): Payer: Medicare PPO | Admitting: Hematology

## 2020-09-17 ENCOUNTER — Ambulatory Visit (HOSPITAL_COMMUNITY): Payer: Medicare PPO

## 2020-09-17 DIAGNOSIS — C7951 Secondary malignant neoplasm of bone: Secondary | ICD-10-CM

## 2020-09-17 DIAGNOSIS — I82622 Acute embolism and thrombosis of deep veins of left upper extremity: Secondary | ICD-10-CM

## 2020-09-17 DIAGNOSIS — C50912 Malignant neoplasm of unspecified site of left female breast: Secondary | ICD-10-CM

## 2020-09-21 ENCOUNTER — Ambulatory Visit (HOSPITAL_COMMUNITY)
Admission: RE | Admit: 2020-09-21 | Discharge: 2020-09-21 | Disposition: A | Discharge status: Home / Self Care | Payer: Medicare PPO | Source: Ambulatory Visit | Attending: Hematology and Oncology | Admitting: Hematology and Oncology

## 2020-09-21 ENCOUNTER — Other Ambulatory Visit: Payer: Self-pay

## 2020-09-21 DIAGNOSIS — C7951 Secondary malignant neoplasm of bone: Secondary | ICD-10-CM | POA: Diagnosis not present

## 2020-09-21 DIAGNOSIS — I2510 Atherosclerotic heart disease of native coronary artery without angina pectoris: Secondary | ICD-10-CM | POA: Diagnosis not present

## 2020-09-21 DIAGNOSIS — Z853 Personal history of malignant neoplasm of breast: Secondary | ICD-10-CM | POA: Diagnosis not present

## 2020-09-21 DIAGNOSIS — C50912 Malignant neoplasm of unspecified site of left female breast: Secondary | ICD-10-CM | POA: Diagnosis not present

## 2020-09-21 DIAGNOSIS — K5730 Diverticulosis of large intestine without perforation or abscess without bleeding: Secondary | ICD-10-CM | POA: Diagnosis not present

## 2020-09-21 DIAGNOSIS — N2889 Other specified disorders of kidney and ureter: Secondary | ICD-10-CM | POA: Diagnosis not present

## 2020-09-21 DIAGNOSIS — J90 Pleural effusion, not elsewhere classified: Secondary | ICD-10-CM | POA: Diagnosis not present

## 2020-09-21 LAB — POCT I-STAT CREATININE: Creatinine, Ser: 1.3 mg/dL — ABNORMAL HIGH (ref 0.44–1.00)

## 2020-09-21 MED ORDER — IOHEXOL 350 MG/ML SOLN
60.0000 mL | Freq: Once | INTRAVENOUS | Status: AC | PRN
  Administered 2020-09-21: 60 mL via INTRAVENOUS

## 2020-09-22 ENCOUNTER — Ambulatory Visit (INDEPENDENT_AMBULATORY_CARE_PROVIDER_SITE_OTHER): Payer: Medicare PPO | Admitting: *Deleted

## 2020-09-22 DIAGNOSIS — Z0000 Encounter for general adult medical examination without abnormal findings: Secondary | ICD-10-CM | POA: Diagnosis not present

## 2020-09-22 NOTE — Progress Notes (Signed)
Subjective:   Robin [name] is a 80 y.o. female who presents for Medicare Annual (Subsequent) preventive examination. I connected with  [name] on 09/22/20 by an audio enabled telemedicine application and verified that I am speaking with the correct person using two identifiers.   I discussed the limitations, risks, security and privacy concerns of performing an evaluation and management service by telephone and the availability of in person appointments. I also discussed with the patient that there may be a patient responsible charge related to this service. The patient expressed understanding and verbally consented to this telephonic visit.   Review of Systems           Objective:    There were no vitals filed for this visit. There is no height or weight on file to calculate BMI.  Advanced Directives 08/07/2020 06/26/2020 05/17/2020 05/03/2020 05/02/2020 05/01/2020 04/04/2020  Does Patient Have a Medical Advance Directive? No No No No No No No  Would patient like information on creating a medical advance directive? No - Patient declined No - Patient declined No - Patient declined No - Patient declined No - Patient declined No - Patient declined No - Patient declined    Current Medications (verified) Outpatient Encounter Medications as of 09/22/2020  Medication Sig   acetaminophen (TYLENOL) 325 MG tablet Take 2 tablets (650 mg total) by mouth every 6 (six) hours as needed for mild pain, fever or headache (or Fever >/= 101).   amLODipine (NORVASC) 10 MG tablet Take 1 tablet (10 mg total) by mouth daily.   anastrozole (ARIMIDEX) 1 MG tablet TAKE 1 TABLET BY MOUTH EVERY DAY   apixaban (ELIQUIS) 5 MG TABS tablet Take 1 tablet (5 mg total) by mouth 2 (two) times daily. Start after you complete the initial starter pack of Eliquis   aspirin 81 MG tablet Take 1 tablet (81 mg total) by mouth daily with breakfast.   carvedilol (COREG) 12.5 MG tablet Take 1 tablet (12.5 mg total) by mouth 2  (two) times daily with a meal.   Cholecalciferol (VITAMIN D3) 125 MCG (5000 UT) CAPS TAKE 1 CAPSULE BY MOUTH ONCE DAILY (Patient taking differently: Take 1 capsule by mouth daily.)   ferrous sulfate 325 (65 FE) MG tablet Take 1 tablet (325 mg total) by mouth daily with breakfast.   furosemide (LASIX) 20 MG tablet Take 1 tablet (20 mg total) by mouth daily.   gabapentin (NEURONTIN) 100 MG capsule TAKE 1 CAPSULE (100 MG TOTAL) BY MOUTH THREE TIMES DAILY.   HYDROcodone-acetaminophen (NORCO/VICODIN) 5-325 MG tablet Take 1 tablet by mouth every 12 (twelve) hours as needed for moderate pain.   isosorbide mononitrate (IMDUR) 30 MG 24 hr tablet Take 1 tablet (30 mg total) by mouth daily.   levothyroxine (SYNTHROID) 125 MCG tablet Take 1 tablet (125 mcg total) by mouth daily before breakfast.   nitroGLYCERIN (NITROSTAT) 0.4 MG SL tablet Place 1 tablet (0.4 mg total) under the tongue every 5 (five) minutes x 3 doses as needed.   Omega-3 Fatty Acids 300 MG CAPS Take 1 capsule (300 mg total) by mouth 2 (two) times daily.   palbociclib (IBRANCE) 100 MG tablet Take 1 tablet (100 mg total) by mouth daily. Take for 21 days on, 7 days off, repeat every 28 days.   pantoprazole (PROTONIX) 40 MG tablet Take 1 tablet (40 mg total) by mouth daily.   potassium chloride (KLOR-CON) 10 MEQ tablet Take 1 tablet (10 mEq total) by mouth daily.   rosuvastatin (  CRESTOR) 40 MG tablet Take 1 tablet (40 mg total) by mouth daily.   senna-docusate (SENOKOT-S) 8.6-50 MG tablet Take 2 tablets by mouth at bedtime.   No facility-administered encounter medications on file as of 09/22/2020.    Allergies (verified) Codeine and Tramadol   History: Past Medical History:  Diagnosis Date   Breast cancer ([place])    Tamoxifen started 2007   CAD (coronary artery disease)    DES second diagonal 2/12   Cardiomyopathy    Probable Takotsubo, LVEF 30-35% 2/12   Carotid artery disease ([facility])    Essential hypertension    Hepatic steatosis     Hyperlipidemia    Hypothyroidism    NSTEMI (non-ST elevated myocardial infarction) ([place])    2/12   Pancreas divisum    Stroke ([place])    (2000) residual left-sided weakness   Type 2 diabetes mellitus ([place])    Past Surgical History:  Procedure Laterality Date   ABDOMINAL HYSTERECTOMY     CAROTID ENDARTERECTOMY     Left   CHOLECYSTECTOMY     COLONOSCOPY WITH PROPOFOL N/A 05/03/2020   Procedure: COLONOSCOPY WITH PROPOFOL;  Surgeon: Daneil Dolin, MD;  [facility];  Service: Endoscopy;  Laterality: N/A;   ESOPHAGOGASTRODUODENOSCOPY (EGD) WITH PROPOFOL N/A 05/02/2020   Procedure: ESOPHAGOGASTRODUODENOSCOPY (EGD) WITH PROPOFOL;  Surgeon: Rogene Houston, MD;  [facility];  Service: Endoscopy;  Laterality: N/A;   GIVENS CAPSULE STUDY N/A 05/04/2020   Procedure: GIVENS CAPSULE STUDY;  Surgeon: Harvel Quale, MD;  [facility];  Service: Gastroenterology;  Laterality: N/A;   JOINT REPLACEMENT     Right [facility]   MASTECTOMY     Bilateral   POLYPECTOMY  05/03/2020   Procedure: POLYPECTOMY;  Surgeon: Daneil Dolin, MD;  [facility];  Service: Endoscopy;;   Family History  Problem Relation Age of Onset   Heart attack Sister    Heart disease Sister    Heart attack Brother    Heart disease Brother    Throat cancer Brother    Colon cancer Neg Hx    Social History   Socioeconomic History   Marital status: Widowed    Spouse name: Not on file   Number of children: Not on file   Years of education: Not on file   Highest education level: Not on file  Occupational History   Not on file  Tobacco Use   Smoking status: Never   Smokeless tobacco: Never  Vaping Use   Vaping Use: Never used  Substance and Sexual Activity   Alcohol use: No    Alcohol/week: 0.0 standard drinks   Drug use: No   Sexual activity: Not on file  Other Topics Concern   Not on file  Social History Narrative   Not on file    Social Determinants of Health   Financial Resource Strain: Not on file  Food Insecurity: Not on file  Transportation Needs: Not on file  Physical Activity: Not on file  Stress: Not on file  Social Connections: Not on file    Tobacco Counseling Counseling given: Not Answered   Clinical Intake:                 Diabetic?Prediabetic         Activities of Daily Living In your present state of health, do you have any difficulty performing the following activities: 05/01/2020 04/04/2020  Hearing? N N  Vision? N N  Difficulty concentrating or making decisions? N N  Walking or climbing stairs? Y N  Dressing or bathing? N N  Doing errands, shopping? Y N  Some recent data might be hidden    Patient Care Team: [name], MD as PCP - General (Internal Medicine) Satira Sark, MD as PCP - Cardiology (Cardiology) Brien Mates, RN as Oncology Nurse Navigator (Oncology)  Indicate any recent Medical Services you may have received from other than [facility] providers in the past year (date may be approximate).     Assessment:   This is a routine wellness examination for [facility].  Hearing/Vision screen No results found.  Dietary issues and exercise activities discussed:     Goals Addressed   None   Depression Screen PHQ 2/9 Scores 07/19/2020 06/06/2020 05/17/2020 04/26/2020 04/11/2020 03/29/2020 05/18/2019  PHQ - 2 Score 0 0 0 0 0 0 0  PHQ- 9 Score - - - - - - 0    Fall Risk Fall Risk  07/19/2020 06/06/2020 05/17/2020 04/26/2020 04/11/2020  Falls in the past year? 0 0 0 0 0  Number falls in past yr: 0 0 0 0 0  Injury with Fall? 0 0 0 0 0  Risk for fall due to : No Fall Risks No Fall Risks No Fall Risks No Fall Risks No Fall Risks  Follow up Falls evaluation completed Falls evaluation completed Falls evaluation completed Falls evaluation completed Falls evaluation completed    FALL RISK PREVENTION PERTAINING TO THE HOME:  Any stairs in or around the home? Yes   If so, are there any without handrails? Yes  Home free of loose throw rugs in walkways, pet beds, electrical cords, etc? Yes  Adequate lighting in your home to reduce risk of falls? Yes   ASSISTIVE DEVICES UTILIZED TO PREVENT FALLS:  Life alert? No  Use of a cane, walker or w/c? Yes  Grab bars in the bathroom? No  Shower chair or bench in shower? Yes  Elevated toilet seat or a handicapped toilet? Yes   TIMED UP AND GO:  Was the test performed? No .  Length of time to ambulate 10 feet: NA sec.     Cognitive Function:        Immunizations Immunization History  Administered Date(s) Administered   Fluad Quad(high Dose 65+) 10/12/2018, 11/04/2019   Influenza, High Dose Seasonal PF 11/17/2016, 12/28/2017   Influenza,inj,Quad PF,6+ Mos 10/31/2014, 11/20/2015   Moderna Sars-Covid-2 Vaccination 06/10/2019, 07/08/2019   Pneumococcal Conjugate-13 12/01/2012    TDAP status: Up to date  Flu Vaccine status: Due, Education has been provided regarding the importance of this vaccine. Advised may receive this vaccine at local pharmacy or Health Dept. Aware to provide a copy of the vaccination record if obtained from local pharmacy or Health Dept. Verbalized acceptance and understanding.  Pneumococcal vaccine status: Up to date  Covid-19 vaccine status: Completed vaccines  Qualifies for Shingles Vaccine? Yes   Zostavax completed No   Shingrix Completed?: No.    Education has been provided regarding the importance of this vaccine. Patient has been advised to call insurance company to determine out of pocket expense if they have not yet received this vaccine. Advised may also receive vaccine at local pharmacy or Health Dept. Verbalized acceptance and understanding.  Screening Tests Health Maintenance  Topic Date Due   Zoster Vaccines- Shingrix (1 of 2) Never done   COVID-19 Vaccine (3 - Moderna risk series) 08/05/2019   INFLUENZA VACCINE  08/20/2020   TETANUS/TDAP  11/07/2020   DEXA  SCAN  Completed   PNA vac Low Risk Adult  Completed   HPV VACCINES  Aged Out    Health Maintenance  Health Maintenance Due  Topic Date Due   Zoster Vaccines- Shingrix (1 of 2) Never done   COVID-19 Vaccine (3 - Moderna risk series) 08/05/2019   INFLUENZA VACCINE  08/20/2020    Colorectal cancer screening: No longer required.    Bone Density: Completed 09-21-20  Lung Cancer Screening: (Low Dose CT Chest recommended if Age 52-80 years, 30 pack-year currently smoking OR have quit w/in 15years.) does not qualify.   Lung Cancer Screening Referral: NA  Additional Screening:  Hepatitis C Screening: does not qualify; Completed   Vision Screening: Recommended annual ophthalmology exams for early detection of glaucoma and other disorders of the eye. Is the patient up to date with their annual eye exam?  Yes  Who is the provider or what is the name of the office in which the patient attends annual eye exams? [facility] If pt is not established with a provider, would they like to be referred to a provider to establish care? No .   Dental Screening: Recommended annual dental exams for proper oral hygiene  Community Resource Referral / Chronic Care Management: CRR required this visit?  No   CCM required this visit?  No      Plan:     I have personally reviewed and noted the following in the patient's chart:   Medical and social history Use of alcohol, tobacco or illicit drugs  Current medications and supplements including opioid prescriptions.  Functional ability and status Nutritional status Physical activity Advanced directives List of other physicians Hospitalizations, surgeries, and ER visits in previous 12 months Vitals Screenings to include cognitive, depression, and falls Referrals and appointments  In addition, I have reviewed and discussed with patient certain preventive protocols, quality metrics, and best practice recommendations. A written  personalized care plan for preventive services as well as general preventive health recommendations were provided to patient.     Shelda Altes, CMA   09/22/2020   Nurse Notes: This was a telehealth visit. The patient was at home. The provider was at home and was Ihor Dow, MD.

## 2020-09-22 NOTE — Patient Instructions (Signed)
Robin [name] , Thank you for taking time to come for your Medicare Wellness Visit. I appreciate your ongoing commitment to your health goals. Please review the following plan we discussed and let me know if I can assist you in the future.   Screening recommendations/referrals: Bone Density: Completed 09-21-20 Recommended yearly ophthalmology/optometry visit for glaucoma screening and checkup Recommended yearly dental visit for hygiene and checkup  Vaccinations: Influenza vaccine: Due now  Pneumococcal vaccine: Completed Tdap vaccine: Due 11-07-20 Shingles vaccine: Due now     Advanced directives: Information provided  Conditions/risks identified: Hypertension, Prediabetic  Next appointment: 1 year   Preventive Care 82 Years and Older, Female Preventive care refers to lifestyle choices and visits with your health care provider that can promote health and wellness. What does preventive care include? A yearly physical exam. This is also called an annual well check. Dental exams once or twice a year. Routine eye exams. Ask your health care provider how often you should have your eyes checked. Personal lifestyle choices, including: Daily care of your teeth and gums. Regular physical activity. Eating a healthy diet. Avoiding tobacco and drug use. Limiting alcohol use. Practicing safe sex. Taking low-dose aspirin every day. Taking vitamin and mineral supplements as recommended by your health care provider. What happens during an annual well check? The services and screenings done by your health care provider during your annual well check will depend on your age, overall health, lifestyle risk factors, and family history of disease. Counseling  Your health care provider may ask you questions about your: Alcohol use. Tobacco use. Drug use. Emotional well-being. Home and relationship well-being. Sexual activity. Eating habits. History of falls. Memory and ability to understand  (cognition). Work and work Statistician. Reproductive health. Screening  You may have the following tests or measurements: Height, weight, and BMI. Blood pressure. Lipid and cholesterol levels. These may be checked every 5 years, or more frequently if you are over 82 years old. Skin check. Lung cancer screening. You may have this screening every year starting at age 82 if you have a 30-pack-year history of smoking and currently smoke or have quit within the past 15 years. Fecal occult blood test (FOBT) of the stool. You may have this test every year starting at age 82. Flexible sigmoidoscopy or colonoscopy. You may have a sigmoidoscopy every 5 years or a colonoscopy every 10 years starting at age 82. Hepatitis C blood test. Hepatitis B blood test. Sexually transmitted disease (STD) testing. Diabetes screening. This is done by checking your blood sugar (glucose) after you have not eaten for a while (fasting). You may have this done every 1-3 years. Bone density scan. This is done to screen for osteoporosis. You may have this done starting at age 82. Mammogram. This may be done every 1-2 years. Talk to your health care provider about how often you should have regular mammograms. Talk with your health care provider about your test results, treatment options, and if necessary, the need for more tests. Vaccines  Your health care provider may recommend certain vaccines, such as: Influenza vaccine. This is recommended every year. Tetanus, diphtheria, and acellular pertussis (Tdap, Td) vaccine. You may need a Td booster every 10 years. Zoster vaccine. You may need this after age 82. Pneumococcal 13-valent conjugate (PCV13) vaccine. One dose is recommended after age 82. Pneumococcal polysaccharide (PPSV23) vaccine. One dose is recommended after age 82. Talk to your health care provider about which screenings and vaccines you need and how often you need  them. This information is not intended to  replace advice given to you by your health care provider. Make sure you discuss any questions you have with your health care provider. Document Released: 02/02/2015 Document Revised: 09/26/2015 Document Reviewed: 11/07/2014 Elsevier Interactive Patient Education  2017 [place] Prevention in the Home Falls can cause injuries. They can happen to people of all ages. There are many things you can do to make your home safe and to help prevent falls. What can I do on the outside of my home? Regularly fix the edges of walkways and driveways and fix any cracks. Remove anything that might make you trip as you walk through a door, such as a raised step or threshold. Trim any bushes or trees on the path to your home. Use bright outdoor lighting. Clear any walking paths of anything that might make someone trip, such as rocks or tools. Regularly check to see if handrails are loose or broken. Make sure that both sides of any steps have handrails. Any raised decks and porches should have guardrails on the edges. Have any leaves, snow, or ice cleared regularly. Use sand or salt on walking paths during winter. Clean up any spills in your garage right away. This includes oil or grease spills. What can I do in the bathroom? Use night lights. Install grab bars by the toilet and in the tub and shower. Do not use towel bars as grab bars. Use non-skid mats or decals in the tub or shower. If you need to sit down in the shower, use a plastic, non-slip stool. Keep the floor dry. Clean up any water that spills on the floor as soon as it happens. Remove soap buildup in the tub or shower regularly. Attach bath mats securely with double-sided non-slip rug tape. Do not have throw rugs and other things on the floor that can make you trip. What can I do in the bedroom? Use night lights. Make sure that you have a light by your bed that is easy to reach. Do not use any sheets or blankets that are too big for  your bed. They should not hang down onto the floor. Have a firm chair that has side arms. You can use this for support while you get dressed. Do not have throw rugs and other things on the floor that can make you trip. What can I do in the kitchen? Clean up any spills right away. Avoid walking on wet floors. Keep items that you use a lot in easy-to-reach places. If you need to reach something above you, use a strong step stool that has a grab bar. Keep electrical cords out of the way. Do not use floor polish or wax that makes floors slippery. If you must use wax, use non-skid floor wax. Do not have throw rugs and other things on the floor that can make you trip. What can I do with my stairs? Do not leave any items on the stairs. Make sure that there are handrails on both sides of the stairs and use them. Fix handrails that are broken or loose. Make sure that handrails are as long as the stairways. Check any carpeting to make sure that it is firmly attached to the stairs. Fix any carpet that is loose or worn. Avoid having throw rugs at the top or bottom of the stairs. If you do have throw rugs, attach them to the floor with carpet tape. Make sure that you have a light switch at  the top of the stairs and the bottom of the stairs. If you do not have them, ask someone to add them for you. What else can I do to help prevent falls? Wear shoes that: Do not have high heels. Have rubber bottoms. Are comfortable and fit you well. Are closed at the toe. Do not wear sandals. If you use a stepladder: Make sure that it is fully opened. Do not climb a closed stepladder. Make sure that both sides of the stepladder are locked into place. Ask someone to hold it for you, if possible. Clearly mark and make sure that you can see: Any grab bars or handrails. First and last steps. Where the edge of each step is. Use tools that help you move around (mobility aids) if they are needed. These  include: Canes. Walkers. Scooters. Crutches. Turn on the lights when you go into a dark area. Replace any light bulbs as soon as they burn out. Set up your furniture so you have a clear path. Avoid moving your furniture around. If any of your floors are uneven, fix them. If there are any pets around you, be aware of where they are. Review your medicines with your doctor. Some medicines can make you feel dizzy. This can increase your chance of falling. Ask your doctor what other things that you can do to help prevent falls. This information is not intended to replace advice given to you by your health care provider. Make sure you discuss any questions you have with your health care provider. Document Released: 11/02/2008 Document Revised: 06/14/2015 Document Reviewed: 02/10/2014 Elsevier Interactive Patient Education  2017 [employer].

## 2020-09-25 NOTE — Progress Notes (Signed)
[facility] [address] [place] [address]   Patient Care Team: [name], MD as PCP - General (Internal Medicine) Satira Sark, MD as PCP - Cardiology (Cardiology) Brien Mates, RN as Oncology Nurse Navigator (Oncology)  SUMMARY OF ONCOLOGIC HISTORY: Oncology History   No history exists.    CHIEF COMPLIANT: Follow-up of left breast cancer with metastases to bones   INTERVAL HISTORY: [name] is a 80 y.o. female here today for follow up of her left breast cancer. Her last visit was on 05/17/2020.   Today she reports feeling good, and she accompanied by her daughter. She denies any new muscular or joint pains, hot flashes, fatigue, and n/v. She is currently taking calcium and vitamin D.   REVIEW OF SYSTEMS:   Review of Systems  Constitutional:  Negative for appetite change and fatigue (80%).  Gastrointestinal:  Negative for nausea and vomiting.  Endocrine: Negative for hot flashes.  Musculoskeletal:  Negative for arthralgias and myalgias.  Neurological:  Positive for numbness (R hand).  All other systems reviewed and are negative.  I have reviewed the past medical history, past surgical history, social history and family history with the patient and they are unchanged from previous note.   ALLERGIES:   is allergic to codeine and tramadol.   MEDICATIONS:  Current Outpatient Medications  Medication Sig Dispense Refill   acetaminophen (TYLENOL) 325 MG tablet Take 2 tablets (650 mg total) by mouth every 6 (six) hours as needed for mild pain, fever or headache (or Fever >/= 101). 12 tablet 0   amLODipine (NORVASC) 10 MG tablet Take 1 tablet (10 mg total) by mouth daily. 90 tablet 3   anastrozole (ARIMIDEX) 1 MG tablet TAKE 1 TABLET BY MOUTH EVERY DAY 30 tablet 3   apixaban (ELIQUIS) 5 MG TABS tablet Take 1 tablet (5 mg total) by mouth 2 (two) times daily. Start after you complete the initial starter pack of Eliquis 60 tablet 5    aspirin 81 MG tablet Take 1 tablet (81 mg total) by mouth daily with breakfast. 30 tablet 3   carvedilol (COREG) 12.5 MG tablet Take 1 tablet (12.5 mg total) by mouth 2 (two) times daily with a meal. 90 tablet 3   Cholecalciferol (VITAMIN D3) 125 MCG (5000 UT) CAPS TAKE 1 CAPSULE BY MOUTH ONCE DAILY (Patient taking differently: Take 1 capsule by mouth daily.) 90 capsule 1   ferrous sulfate 325 (65 FE) MG tablet Take 1 tablet (325 mg total) by mouth daily with breakfast. 30 tablet 0   furosemide (LASIX) 20 MG tablet Take 1 tablet (20 mg total) by mouth daily. 90 tablet 1   gabapentin (NEURONTIN) 100 MG capsule TAKE 1 CAPSULE (100 MG TOTAL) BY MOUTH THREE TIMES DAILY. 90 capsule 2   HYDROcodone-acetaminophen (NORCO/VICODIN) 5-325 MG tablet Take 1 tablet by mouth every 12 (twelve) hours as needed for moderate pain. 20 tablet 0   isosorbide mononitrate (IMDUR) 30 MG 24 hr tablet Take 1 tablet (30 mg total) by mouth daily. 90 tablet 2   levothyroxine (SYNTHROID) 125 MCG tablet Take 1 tablet (125 mcg total) by mouth daily before breakfast. 90 tablet 1   nitroGLYCERIN (NITROSTAT) 0.4 MG SL tablet Place 1 tablet (0.4 mg total) under the tongue every 5 (five) minutes x 3 doses as needed. 25 tablet 3   Omega-3 Fatty Acids 300 MG CAPS Take 1 capsule (300 mg total) by mouth 2 (two) times daily.  palbociclib (IBRANCE) 100 MG tablet Take 1 tablet (100 mg total) by mouth daily. Take for 21 days on, 7 days off, repeat every 28 days. 21 tablet 0   pantoprazole (PROTONIX) 40 MG tablet Take 1 tablet (40 mg total) by mouth daily. 30 tablet 3   potassium chloride (KLOR-CON) 10 MEQ tablet Take 1 tablet (10 mEq total) by mouth daily. 90 tablet 1   rosuvastatin (CRESTOR) 40 MG tablet Take 1 tablet (40 mg total) by mouth daily. 90 tablet 2   senna-docusate (SENOKOT-S) 8.6-50 MG tablet Take 2 tablets by mouth at bedtime. 60 tablet 1   No current facility-administered medications for this visit.     PHYSICAL  EXAMINATION: Performance status (ECOG): 2 - Symptomatic, <50% confined to bed  There were no vitals filed for this visit. Wt Readings from Last 3 Encounters:  09/13/20 216 lb 6.4 oz (98.2 kg)  08/09/20 215 lb 6.4 oz (97.7 kg)  08/07/20 216 lb 9.6 oz (98.2 kg)   Physical Exam Vitals reviewed.  Constitutional:      Appearance: Normal appearance.     Comments: In wheelchair  Cardiovascular:     Rate and Rhythm: Normal rate and regular rhythm.     Pulses: Normal pulses.     Heart sounds: Normal heart sounds.  Pulmonary:     Effort: Pulmonary effort is normal.     Breath sounds: Normal breath sounds.  Neurological:     General: No focal deficit present.     Mental Status: She is alert and oriented to person, place, and time.  Psychiatric:        Mood and Affect: Mood normal.        Behavior: Behavior normal.    Breast Exam Chaperone: Thana Ates     LABORATORY DATA:  I have reviewed the data as listed CMP Latest Ref Rng & Units 09/21/2020 08/07/2020 06/26/2020  Glucose 70 - 99 mg/dL - 110(H) 100(H)  BUN 8 - 23 mg/dL - 21 20  Creatinine 0.44 - 1.00 mg/dL 1.30(H) 1.37(H) 1.10(H)  Sodium 135 - 145 mmol/L - 137 139  Potassium 3.5 - 5.1 mmol/L - 3.5 3.4(L)  Chloride 98 - 111 mmol/L - 108 107  CO2 22 - 32 mmol/L - 21(L) 25  Calcium 8.9 - 10.3 mg/dL - 8.8(L) 9.6  Total Protein 6.5 - 8.1 g/dL - 7.5 7.3  Total Bilirubin 0.3 - 1.2 mg/dL - 0.4 0.8  Alkaline Phos 38 - 126 U/L - 98 213(H)  AST 15 - 41 U/L - 17 19  ALT 0 - 44 U/L - 12 13   Lab Results  Component Value Date   CAN153 33.6 (H) 08/07/2020   CAN153 40.2 (H) 06/26/2020   Lab Results  Component Value Date   WBC 7.2 08/07/2020   HGB 11.4 (L) 08/07/2020   HCT 38.2 08/07/2020   MCV 84.1 08/07/2020   PLT 168 08/07/2020   NEUTROABS 5.4 08/07/2020    ASSESSMENT:  1.  Recurrent breast carcinoma with metastases to bones: -History of right breast cancer in 1987, status post right mastectomy requiring no adjuvant chemo or  XRT.  Was not on tamoxifen. -History of left breast cancer in 2007, status post mastectomy by Dr. Carlis Abbott at [facility], placed on tamoxifen, discontinued around 2015. -Recent upper back pain, weight loss of 20 pounds in the last 3 to 6 months. -CT angio on 04/04/2020 showed irregular mass in the left chest wall concerning for recurrent breast cancer measuring 4.5 x 3.1  cm. -Also showed lytic lesions in T1 and T3 vertebral bodies as well as manubrium. -Left chest wall mass biopsy on 04/05/2020 consistent with adenocarcinoma compatible with recurrent breast carcinoma.  ER 95% positive, PR 40% positive, HER2 2+, negative by FISH. - PET scan on 05/15/2020 shows hypermetabolic mass in the left anterior chest wall, mild hypermetabolic thoracic lymphadenopathy, diffuse bone metastasis throughout the spine, sternum, both shoulders, pelvis, proximal right femur which are both lytic and sclerotic. - Anastrozole and palbociclib started around 05/21/2020.   2.  Left upper extremity DVT: -Left upper extremity Doppler on 04/04/2020 + for DVT involving left internal jugular vein, left subclavian vein, left axillary vein, left brachial and left basilic vein.   3.  Family history: - No major family history of malignancies other than her brother with throat cancer.   PLAN:  1.  Metastatic breast cancer to the bones, ER/PR positive: - She is tolerating Ibrance 100 mg 3 weeks on 1 week of anastrozole daily very well. - Very minor hot flashes with no major arthralgias or myalgias. - Reviewed labs today which showed normal LFTs.  CBC was grossly normal.  CA 15-3 was 35.9.  CA 27-29 was normal at 33.7. - Reviewed CT CAP from 09/21/2020 which showed similar appearing left chest wall mass.  Interval sclerosis of diffuse bony osseous metastasis.  Partially exophytic lesion of the inferior pole of the left kidney. - She has only taken 1 bottle of palbociclib so far.  I plan to see her back in 6 weeks for follow-up.   We will plan to repeat tumor markers at that time.   2.  Left upper extremity DVT: - Continue Eliquis.  No bleeding issues reported.   3.  Right chest wall and scapular pain: - Pain is well controlled since we started systemic therapy.   4.  Bone metastasis: - We talked about initiating her on denosumab.  We discussed side effects in detail including rare chance of ONJ. - Reviewed labs which showed normal calcium and mildly elevated creatinine.  She will proceed with her first dose today.  5.  Left kidney mass: - She has partially exophytic lesion of the inferior pole of left kidney.  This may potentially represent a complex cyst.  We will continue monitoring on subsequent scans.  Breast Cancer therapy associated bone loss: I have recommended calcium, Vitamin D and weight bearing exercises.  Orders placed this encounter:  No orders of the defined types were placed in this encounter.   The patient has a good understanding of the overall plan. She agrees with it. She will call with any problems that may develop before the next visit here.  Derek Jack, MD [place] [phone]   I, Thana Ates, am acting as a scribe for Dr. Derek Jack.  I, Derek Jack MD, have reviewed the above documentation for accuracy and completeness, and I agree with the above.

## 2020-09-26 ENCOUNTER — Inpatient Hospital Stay (HOSPITAL_COMMUNITY): Payer: Medicare PPO

## 2020-09-26 ENCOUNTER — Other Ambulatory Visit: Payer: Self-pay

## 2020-09-26 ENCOUNTER — Inpatient Hospital Stay (HOSPITAL_COMMUNITY): Payer: Medicare PPO | Attending: Hematology | Admitting: Hematology

## 2020-09-26 VITALS — HR 60 | Temp 96.7°F | Resp 17 | Wt 219.0 lb

## 2020-09-26 DIAGNOSIS — C50912 Malignant neoplasm of unspecified site of left female breast: Secondary | ICD-10-CM | POA: Insufficient documentation

## 2020-09-26 DIAGNOSIS — Z7901 Long term (current) use of anticoagulants: Secondary | ICD-10-CM | POA: Insufficient documentation

## 2020-09-26 DIAGNOSIS — Z79811 Long term (current) use of aromatase inhibitors: Secondary | ICD-10-CM | POA: Diagnosis not present

## 2020-09-26 DIAGNOSIS — C7951 Secondary malignant neoplasm of bone: Secondary | ICD-10-CM

## 2020-09-26 DIAGNOSIS — Z79818 Long term (current) use of other agents affecting estrogen receptors and estrogen levels: Secondary | ICD-10-CM | POA: Diagnosis not present

## 2020-09-26 DIAGNOSIS — Z170 Estrogen receptor positive status [ER+]: Secondary | ICD-10-CM | POA: Diagnosis not present

## 2020-09-26 DIAGNOSIS — Z79899 Other long term (current) drug therapy: Secondary | ICD-10-CM | POA: Insufficient documentation

## 2020-09-26 DIAGNOSIS — Z7982 Long term (current) use of aspirin: Secondary | ICD-10-CM | POA: Insufficient documentation

## 2020-09-26 DIAGNOSIS — R232 Flushing: Secondary | ICD-10-CM | POA: Insufficient documentation

## 2020-09-26 DIAGNOSIS — I82622 Acute embolism and thrombosis of deep veins of left upper extremity: Secondary | ICD-10-CM

## 2020-09-26 DIAGNOSIS — N2889 Other specified disorders of kidney and ureter: Secondary | ICD-10-CM | POA: Diagnosis not present

## 2020-09-26 DIAGNOSIS — M25519 Pain in unspecified shoulder: Secondary | ICD-10-CM | POA: Insufficient documentation

## 2020-09-26 LAB — COMPREHENSIVE METABOLIC PANEL
ALT: 11 U/L (ref 0–44)
AST: 18 U/L (ref 15–41)
Albumin: 4 g/dL (ref 3.5–5.0)
Alkaline Phosphatase: 66 U/L (ref 38–126)
Anion gap: 7 (ref 5–15)
BUN: 21 mg/dL (ref 8–23)
CO2: 25 mmol/L (ref 22–32)
Calcium: 9.5 mg/dL (ref 8.9–10.3)
Chloride: 110 mmol/L (ref 98–111)
Creatinine, Ser: 1.06 mg/dL — ABNORMAL HIGH (ref 0.44–1.00)
GFR, Estimated: 52 mL/min — ABNORMAL LOW (ref >60)
Glucose, Bld: 101 mg/dL — ABNORMAL HIGH (ref 70–99)
Potassium: 4 mmol/L (ref 3.5–5.1)
Sodium: 142 mmol/L (ref 135–145)
Total Bilirubin: 0.7 mg/dL (ref 0.3–1.2)
Total Protein: 7.6 g/dL (ref 6.5–8.1)

## 2020-09-26 LAB — CBC WITH DIFFERENTIAL/PLATELET
Abs Immature Granulocytes: 0.02 10*3/uL (ref 0.00–0.07)
Basophils Absolute: 0 10*3/uL (ref 0.0–0.1)
Basophils Relative: 0 %
Eosinophils Absolute: 0.2 10*3/uL (ref 0.0–0.5)
Eosinophils Relative: 3 %
HCT: 36.8 % (ref 36.0–46.0)
Hemoglobin: 11.2 g/dL — ABNORMAL LOW (ref 12.0–15.0)
Immature Granulocytes: 0 %
Lymphocytes Relative: 16 %
Lymphs Abs: 1 10*3/uL (ref 0.7–4.0)
MCH: 25.1 pg — ABNORMAL LOW (ref 26.0–34.0)
MCHC: 30.4 g/dL (ref 30.0–36.0)
MCV: 82.5 fL (ref 80.0–100.0)
Monocytes Absolute: 0.5 10*3/uL (ref 0.1–1.0)
Monocytes Relative: 8 %
Neutro Abs: 4.5 10*3/uL (ref 1.7–7.7)
Neutrophils Relative %: 73 %
Platelets: 161 10*3/uL (ref 150–400)
RBC: 4.46 MIL/uL (ref 3.87–5.11)
RDW: 16.7 % — ABNORMAL HIGH (ref 11.5–15.5)
WBC: 6.1 10*3/uL (ref 4.0–10.5)
nRBC: 0 % (ref 0.0–0.2)

## 2020-09-26 LAB — MAGNESIUM: Magnesium: 2.3 mg/dL (ref 1.7–2.4)

## 2020-09-26 LAB — VITAMIN D 25 HYDROXY (VIT D DEFICIENCY, FRACTURES): Vit D, 25-Hydroxy: 65.41 ng/mL (ref 30–100)

## 2020-09-26 MED ORDER — DENOSUMAB 120 MG/1.7ML ~~LOC~~ SOLN
120.0000 mg | Freq: Once | SUBCUTANEOUS | Status: AC
  Administered 2020-09-26: 120 mg via SUBCUTANEOUS
  Filled 2020-09-26: qty 1.7

## 2020-09-26 NOTE — Patient Instructions (Addendum)
[place] at [facility] Discharge Instructions  You were seen today by Dr. Delton Coombes. He went over your recent results and scans, and you received your injection. Dr. Delton Coombes will see you back in 6 weeks for labs and follow up.   Thank you for choosing [place] at [facility] to provide your oncology and hematology care.  To afford each patient quality time with our provider, please arrive at least 15 minutes before your scheduled appointment time.   If you have a lab appointment with the [place] [facility] please come in thru the Main Entrance and check in at the main information desk  You need to re-schedule your appointment should you arrive 10 or more minutes late.  We strive to give you quality time with our providers, and arriving late affects you and other patients whose appointments are after yours.  Also, if you no show three or more times for appointments you may be dismissed from the clinic at the providers discretion.     Again, thank you for choosing [facility].  Our hope is that these requests will decrease the amount of time that you wait before being seen by our physicians.       _____________________________________________________________  Should you have questions after your visit to [facility], please contact our office at [phone] between the hours of 8:00 a.m. and 4:30 p.m.  Voicemails left after 4:00 p.m. will not be returned until the following business day.  For prescription refill requests, have your pharmacy contact our office and allow 72 hours.    Cancer Center Support Programs:   > Cancer Support Group  2nd Tuesday of the month 1pm-2pm, Journey Room

## 2020-09-26 NOTE — Progress Notes (Signed)
Xgeva 120 mg given SQ in right arm.  Site clean dry and intact.  Patient discharged to home via w/c accompanied by daughter in stable condition.

## 2020-09-26 NOTE — Progress Notes (Signed)
Patient is taking anastrozole as prescribed.  They have not missed any doses and report no side effects at this time.   

## 2020-09-27 ENCOUNTER — Encounter (HOSPITAL_COMMUNITY): Payer: Self-pay | Admitting: Hematology

## 2020-09-27 LAB — CANCER ANTIGEN 15-3: CA 15-3: 35.9 U/mL — ABNORMAL HIGH (ref 0.0–25.0)

## 2020-09-27 LAB — CANCER ANTIGEN 27.29: CA 27.29: 33.7 U/mL (ref 0.0–38.6)

## 2020-10-06 ENCOUNTER — Other Ambulatory Visit: Payer: Self-pay | Admitting: Nurse Practitioner

## 2020-10-06 DIAGNOSIS — E039 Hypothyroidism, unspecified: Secondary | ICD-10-CM

## 2020-10-10 ENCOUNTER — Other Ambulatory Visit: Payer: Self-pay | Admitting: Nurse Practitioner

## 2020-10-10 DIAGNOSIS — E039 Hypothyroidism, unspecified: Secondary | ICD-10-CM

## 2020-10-15 ENCOUNTER — Other Ambulatory Visit: Payer: Self-pay | Admitting: Internal Medicine

## 2020-10-19 ENCOUNTER — Ambulatory Visit: Payer: Medicare PPO | Admitting: Internal Medicine

## 2020-10-31 ENCOUNTER — Telehealth: Payer: Self-pay | Admitting: Internal Medicine

## 2020-10-31 NOTE — Telephone Encounter (Signed)
FMLA forms         Noted   Copied   Sleeved

## 2020-11-05 ENCOUNTER — Other Ambulatory Visit: Payer: Self-pay

## 2020-11-05 ENCOUNTER — Ambulatory Visit (INDEPENDENT_AMBULATORY_CARE_PROVIDER_SITE_OTHER): Payer: Medicare PPO | Admitting: Internal Medicine

## 2020-11-05 ENCOUNTER — Encounter: Payer: Self-pay | Admitting: Internal Medicine

## 2020-11-05 ENCOUNTER — Encounter (INDEPENDENT_AMBULATORY_CARE_PROVIDER_SITE_OTHER): Payer: Self-pay

## 2020-11-05 VITALS — BP 134/78 | HR 76 | Temp 98.2°F | Ht 66.0 in | Wt 219.0 lb

## 2020-11-05 DIAGNOSIS — I82622 Acute embolism and thrombosis of deep veins of left upper extremity: Secondary | ICD-10-CM | POA: Diagnosis not present

## 2020-11-05 DIAGNOSIS — I10 Essential (primary) hypertension: Secondary | ICD-10-CM

## 2020-11-05 DIAGNOSIS — Z23 Encounter for immunization: Secondary | ICD-10-CM

## 2020-11-05 DIAGNOSIS — C50912 Malignant neoplasm of unspecified site of left female breast: Secondary | ICD-10-CM | POA: Diagnosis not present

## 2020-11-05 DIAGNOSIS — E039 Hypothyroidism, unspecified: Secondary | ICD-10-CM

## 2020-11-05 NOTE — Progress Notes (Signed)
Established Patient Office Visit  Subjective:  Patient ID: Robin [name], female    DOB: 19-Apr-1940  Age: 80 y.o. MRN: [id number]  CC:  Chief Complaint  Patient presents with   Follow-up    3 month F/U    HPI Robin [name]  is a 80 year old female with PMH of HTN, CAD s/p stent placement, carotid artery occlusion s/p carotid endarterectomy, hypothyroidism, generalized OA and GERD who presents for f/u of her chronic medical conditions.  HTN: BP is well-controlled. Takes medications regularly. Patient denies headache, dizziness, chest pain, dyspnea or palpitations.  DVT of left UE: She has completed Eliquis now.  Her arm swelling has improved.  Her leg swelling has been stable and does not require Lasix on a daily basis.  She does leg elevation every day.  She is able to ambulate now.  She is on Ibrance and anastrozole for recurrence of breast cancer.  She has chronic UE lymphedema, but is stable currently.  Past Medical History:  Diagnosis Date   Breast cancer ([place])    Tamoxifen started 2007   CAD (coronary artery disease)    DES second diagonal 2/12   Cardiomyopathy    Probable Takotsubo, LVEF 30-35% 2/12   Carotid artery disease ([facility])    Essential hypertension    Hepatic steatosis    Hyperlipidemia    Hypothyroidism    NSTEMI (non-ST elevated myocardial infarction) ([place])    2/12   Pancreas divisum    Stroke ([place])    (2000) residual left-sided weakness   Type 2 diabetes mellitus ([place])     Past Surgical History:  Procedure Laterality Date   ABDOMINAL HYSTERECTOMY     CAROTID ENDARTERECTOMY     Left   CHOLECYSTECTOMY     COLONOSCOPY WITH PROPOFOL N/A 05/03/2020   Procedure: COLONOSCOPY WITH PROPOFOL;  Surgeon: Daneil Dolin, MD;  [facility];  Service: Endoscopy;  Laterality: N/A;   ESOPHAGOGASTRODUODENOSCOPY (EGD) WITH PROPOFOL N/A 05/02/2020   Procedure: ESOPHAGOGASTRODUODENOSCOPY (EGD) WITH PROPOFOL;  Surgeon: Rogene Houston, MD;  [facility]:  [facility];  Service: Endoscopy;  Laterality: N/A;   GIVENS CAPSULE STUDY N/A 05/04/2020   Procedure: GIVENS CAPSULE STUDY;  Surgeon: Harvel Quale, MD;  Location: [facility];  Service: Gastroenterology;  Laterality: N/A;   JOINT REPLACEMENT     Right knee replacement- [facility]   MASTECTOMY     Bilateral   POLYPECTOMY  05/03/2020   Procedure: POLYPECTOMY;  Surgeon: Daneil Dolin, MD;  Location: [facility];  Service: Endoscopy;;    Family History  Problem Relation Age of Onset   Heart attack Sister    Heart disease Sister    Heart attack Brother    Heart disease Brother    Throat cancer Brother    Colon cancer Neg Hx     Social History   Socioeconomic History   Marital status: Widowed    Spouse name: Not on file   Number of children: Not on file   Years of education: Not on file   Highest education level: Not on file  Occupational History   Not on file  Tobacco Use   Smoking status: Never   Smokeless tobacco: Never  Vaping Use   Vaping Use: Never used  Substance and Sexual Activity   Alcohol use: No    Alcohol/week: 0.0 standard drinks   Drug use: No   Sexual activity: Not on file  Other Topics Concern   Not  on file  Social History Narrative   Not on file   Social Determinants of Health   Financial Resource Strain: Low Risk    Difficulty of Paying Living Expenses: Not hard at all  Food Insecurity: No Food Insecurity   Worried About Charity fundraiser in the Last Year: Never true   Arboriculturist in the Last Year: Never true  Transportation Needs: No Transportation Needs   Lack of Transportation (Medical): No   Lack of Transportation (Non-Medical): No  Physical Activity: Insufficiently Active   Days of Exercise per Week: 3 days   Minutes of Exercise per Session: 30 min  Stress: No Stress Concern Present   Feeling of Stress : Not at all  Social Connections: Moderately Isolated   Frequency of Communication with Friends and  Family: More than three times a week   Frequency of Social Gatherings with Friends and Family: More than three times a week   Attends Religious Services: More than 4 times per year   Active Member of [employer] or Organizations: No   Attends Archivist Meetings: Never   Marital Status: Widowed  Human resources officer Violence: Not At Risk   Fear of Current or Ex-Partner: No   Emotionally Abused: No   Physically Abused: No   Sexually Abused: No    Outpatient Medications Prior to Visit  Medication Sig Dispense Refill   acetaminophen (TYLENOL) 325 MG tablet Take 2 tablets (650 mg total) by mouth every 6 (six) hours as needed for mild pain, fever or headache (or Fever >/= 101). 12 tablet 0   amLODipine (NORVASC) 10 MG tablet Take 1 tablet (10 mg total) by mouth daily. 90 tablet 3   anastrozole (ARIMIDEX) 1 MG tablet TAKE 1 TABLET BY MOUTH EVERY DAY 30 tablet 3   apixaban (ELIQUIS) 5 MG TABS tablet Take 1 tablet (5 mg total) by mouth 2 (two) times daily. Start after you complete the initial starter pack of Eliquis 60 tablet 5   aspirin 81 MG tablet Take 1 tablet (81 mg total) by mouth daily with breakfast. 30 tablet 3   carvedilol (COREG) 12.5 MG tablet Take 1 tablet (12.5 mg total) by mouth 2 (two) times daily with a meal. 90 tablet 3   Cholecalciferol (VITAMIN D3) 125 MCG (5000 UT) CAPS TAKE 1 CAPSULE BY MOUTH ONCE DAILY (Patient taking differently: Take 1 capsule by mouth daily.) 90 capsule 1   ferrous sulfate 325 (65 FE) MG tablet Take 1 tablet (325 mg total) by mouth daily with breakfast. 30 tablet 0   furosemide (LASIX) 20 MG tablet TAKE 1 TABLET BY MOUTH EVERY DAY AS NEEDED AS DIRECTED 30 tablet 1   gabapentin (NEURONTIN) 100 MG capsule TAKE 1 CAPSULE (100 MG TOTAL) BY MOUTH THREE TIMES DAILY. 90 capsule 2   HYDROcodone-acetaminophen (NORCO/VICODIN) 5-325 MG tablet Take 1 tablet by mouth every 12 (twelve) hours as needed for moderate pain. 20 tablet 0   isosorbide mononitrate (IMDUR) 30 MG  24 hr tablet Take 1 tablet (30 mg total) by mouth daily. 90 tablet 2   levothyroxine (SYNTHROID) 125 MCG tablet Take 1 tablet (125 mcg total) by mouth daily before breakfast. 90 tablet 1   nitroGLYCERIN (NITROSTAT) 0.4 MG SL tablet Place 1 tablet (0.4 mg total) under the tongue every 5 (five) minutes x 3 doses as needed. 25 tablet 3   Omega-3 Fatty Acids 300 MG CAPS Take 1 capsule (300 mg total) by mouth 2 (two) times daily.  palbociclib (IBRANCE) 100 MG tablet Take 1 tablet (100 mg total) by mouth daily. Take for 21 days on, 7 days off, repeat every 28 days. 21 tablet 0   pantoprazole (PROTONIX) 40 MG tablet Take 1 tablet (40 mg total) by mouth daily. 30 tablet 3   potassium chloride (KLOR-CON) 10 MEQ tablet Take 1 tablet (10 mEq total) by mouth daily. 90 tablet 1   rosuvastatin (CRESTOR) 40 MG tablet Take 1 tablet (40 mg total) by mouth daily. 90 tablet 2   senna-docusate (SENOKOT-S) 8.6-50 MG tablet Take 2 tablets by mouth at bedtime. 60 tablet 1   No facility-administered medications prior to visit.    Allergies  Allergen Reactions   Codeine Nausea And Vomiting   Tramadol     ROS Review of Systems  Constitutional:  Negative for chills and fever.  HENT:  Negative for congestion, sinus pressure, sinus pain and sore throat.   Eyes:  Negative for pain and discharge.  Respiratory:  Negative for cough and shortness of breath.   Cardiovascular:  Positive for leg swelling. Negative for chest pain and palpitations.  Gastrointestinal:  Negative for abdominal pain, constipation, diarrhea, nausea and vomiting.  Endocrine: Negative for polydipsia and polyuria.  Genitourinary:  Negative for dysuria and hematuria.  Musculoskeletal:  Negative for neck pain and neck stiffness.  Skin:  Negative for rash.  Neurological:  Negative for dizziness and weakness.  Psychiatric/Behavioral:  Negative for agitation and behavioral problems.      Objective:    Physical Exam Vitals reviewed.   Constitutional:      General: She is not in acute distress.    Appearance: She is not diaphoretic.  HENT:     Head: Normocephalic and atraumatic.     Nose: Nose normal.     Mouth/Throat:     Mouth: Mucous membranes are moist.  Eyes:     General: No scleral icterus.    Extraocular Movements: Extraocular movements intact.  Cardiovascular:     Rate and Rhythm: Normal rate and regular rhythm.     Pulses: Normal pulses.     Heart sounds: Normal heart sounds. No murmur heard. Pulmonary:     Breath sounds: Normal breath sounds. No wheezing or rales.  Musculoskeletal:     Cervical back: Neck supple. No tenderness.     Right lower leg: Edema (1+) present.     Left lower leg: Edema (1+) present.     Comments: B/l UE swelling  Skin:    General: Skin is warm.     Findings: No rash.  Neurological:     General: No focal deficit present.     Mental Status: She is alert and oriented to person, place, and time.  Psychiatric:        Mood and Affect: Mood normal.        Behavior: Behavior normal.    BP 134/78 (BP Location: Left Arm, Patient Position: Sitting, Cuff Size: Normal)   Pulse 76   Temp 98.2 F (36.8 C) (Oral)   Ht _0  (1.676 m)   Wt 219 lb (99.3 kg)   SpO2 96%   BMI 35.35 kg/m  Wt Readings from Last 3 Encounters:  11/05/20 219 lb (99.3 kg)  09/26/20 219 lb (99.3 kg)  09/13/20 216 lb 6.4 oz (98.2 kg)     Health Maintenance Due  Topic Date Due   Zoster Vaccines- Shingrix (1 of 2) Never done   COVID-19 Vaccine (3 - Moderna risk series) 08/05/2019  There are no preventive care reminders to display for this patient.  Lab Results  Component Value Date   TSH 0.173 (L) 07/19/2020   Lab Results  Component Value Date   WBC 6.1 09/26/2020   HGB 11.2 (L) 09/26/2020   HCT 36.8 09/26/2020   MCV 82.5 09/26/2020   PLT 161 09/26/2020   Lab Results  Component Value Date   NA 142 09/26/2020   K 4.0 09/26/2020   CO2 25 09/26/2020   GLUCOSE 101 (H) 09/26/2020   BUN  21 09/26/2020   CREATININE 1.06 (H) 09/26/2020   BILITOT 0.7 09/26/2020   ALKPHOS 66 09/26/2020   AST 18 09/26/2020   ALT 11 09/26/2020   PROT 7.6 09/26/2020   ALBUMIN 4.0 09/26/2020   CALCIUM 9.5 09/26/2020   ANIONGAP 7 09/26/2020   EGFR 31 (L) 04/11/2020   Lab Results  Component Value Date   CHOL 145 03/01/2020   Lab Results  Component Value Date   HDL 46 (L) 03/01/2020   Lab Results  Component Value Date   LDLCALC 80 03/01/2020   Lab Results  Component Value Date   TRIG 109 03/01/2020   Lab Results  Component Value Date   CHOLHDL 3.2 03/01/2020   Lab Results  Component Value Date   HGBA1C 5.9 (H) 03/01/2020      Assessment & Plan:   Problem List Items Addressed This Visit       Cardiovascular and Mediastinum   Essential hypertension, benign - Primary    BP Readings from Last 1 Encounters:  11/05/20 134/78  Well-controlled Counseled for compliance with the medications Advised DASH diet and walking as tolerated      Acute DVT (deep venous thrombosis) --LtUE---03/2020    Completed Eliquis for 6 months        Endocrine   Hypothyroidism    Lab Results  Component Value Date   TSH 0.173 (L) 07/19/2020  On Levothyroxine 125 mcg QD Followed by endocrinology        Other   Recurrent malignant neoplasm of left breast ([place])    CT chest showed recurrence of breast ca. On Anastrozole and Ibrance F/u with Heme/Onc.      Other Visit Diagnoses     Need for immunization against influenza       Relevant Orders   Flu Vaccine QUAD High Dose(Fluad) (Completed)       No orders of the defined types were placed in this encounter.   Follow-up: Return in about 6 months (around 05/06/2021) for Annual physical.    [name], MD

## 2020-11-05 NOTE — Patient Instructions (Signed)
Please continue to take medications as prescribed.  Please continue leg elevation as tolerated for leg swelling.

## 2020-11-05 NOTE — Assessment & Plan Note (Signed)
Completed Eliquis for 6 months

## 2020-11-05 NOTE — Assessment & Plan Note (Signed)
BP Readings from Last 1 Encounters:  11/05/20 134/78   Well-controlled Counseled for compliance with the medications Advised DASH diet and walking as tolerated

## 2020-11-05 NOTE — Assessment & Plan Note (Signed)
CT chest showed recurrence of breast ca. On Anastrozole and Ibrance F/u with Heme/Onc. 

## 2020-11-05 NOTE — Assessment & Plan Note (Signed)
Lab Results  Component Value Date   TSH 0.173 (L) 07/19/2020   On Levothyroxine 125 mcg QD Followed by endocrinology

## 2020-11-07 ENCOUNTER — Ambulatory Visit (HOSPITAL_COMMUNITY): Payer: Medicare PPO | Admitting: Hematology

## 2020-11-07 ENCOUNTER — Other Ambulatory Visit: Payer: Self-pay

## 2020-11-07 ENCOUNTER — Inpatient Hospital Stay (HOSPITAL_COMMUNITY): Payer: Medicare PPO

## 2020-11-07 ENCOUNTER — Inpatient Hospital Stay (HOSPITAL_COMMUNITY): Payer: Medicare PPO | Attending: Hematology

## 2020-11-07 DIAGNOSIS — N2889 Other specified disorders of kidney and ureter: Secondary | ICD-10-CM | POA: Insufficient documentation

## 2020-11-07 DIAGNOSIS — Z86718 Personal history of other venous thrombosis and embolism: Secondary | ICD-10-CM | POA: Insufficient documentation

## 2020-11-07 DIAGNOSIS — R232 Flushing: Secondary | ICD-10-CM | POA: Insufficient documentation

## 2020-11-07 DIAGNOSIS — Z79899 Other long term (current) drug therapy: Secondary | ICD-10-CM | POA: Insufficient documentation

## 2020-11-07 DIAGNOSIS — C50912 Malignant neoplasm of unspecified site of left female breast: Secondary | ICD-10-CM | POA: Insufficient documentation

## 2020-11-07 DIAGNOSIS — Z170 Estrogen receptor positive status [ER+]: Secondary | ICD-10-CM | POA: Insufficient documentation

## 2020-11-07 DIAGNOSIS — Z7982 Long term (current) use of aspirin: Secondary | ICD-10-CM | POA: Insufficient documentation

## 2020-11-07 DIAGNOSIS — C7951 Secondary malignant neoplasm of bone: Secondary | ICD-10-CM | POA: Insufficient documentation

## 2020-11-07 NOTE — Telephone Encounter (Signed)
Called lvm forms are ready for pickup.

## 2020-11-13 ENCOUNTER — Ambulatory Visit (HOSPITAL_COMMUNITY): Payer: Medicare PPO

## 2020-11-13 ENCOUNTER — Other Ambulatory Visit (HOSPITAL_COMMUNITY): Payer: Medicare PPO

## 2020-11-13 ENCOUNTER — Ambulatory Visit (HOSPITAL_COMMUNITY): Payer: Medicare PPO | Admitting: Hematology

## 2020-11-13 NOTE — Progress Notes (Signed)
[facility] [address], [place] [address]   Patient Care Team: [name], MD as PCP - General (Internal Medicine) Satira Sark, MD as PCP - Cardiology (Cardiology) Brien Mates, RN as Oncology Nurse Navigator (Oncology)  SUMMARY OF ONCOLOGIC HISTORY: Oncology History   No history exists.    CHIEF COMPLIANT: Follow-up of left breast cancer with metastases to bones   INTERVAL HISTORY: [name] is a 80 y.o. female here today for follow up of her left breast cancer with metastases to bones. Her last visit was on 09/26/2020.   Today she reports feeling good. She is not taking Ibrance, and she reports that she has never taken [place]. She is taking Eliquis and is tolerating it well, and she denies any current bleeding. Her right chest pain has resolved. She denies aches and pain as well as hot flashes. She lives at home with her granddaughter and she is able to do all of her typical home activities unassisted. Her appetite is good.  REVIEW OF SYSTEMS:   Review of Systems  Constitutional:  Negative for appetite change and fatigue (75%).  HENT:   Negative for nosebleeds.   Respiratory:  Negative for hemoptysis.   Cardiovascular:  Negative for chest pain.  Gastrointestinal:  Negative for blood in stool.  Endocrine: Negative for hot flashes.  Genitourinary:  Negative for hematuria and vaginal bleeding.   Musculoskeletal:  Negative for arthralgias and myalgias.  Neurological:  Positive for numbness.  Hematological:  Does not bruise/bleed easily.  All other systems reviewed and are negative.  I have reviewed the past medical history, past surgical history, social history and family history with the patient and they are unchanged from previous note.   ALLERGIES:   is allergic to codeine and tramadol.   MEDICATIONS:  Current Outpatient Medications  Medication Sig Dispense Refill   acetaminophen (TYLENOL) 325 MG tablet Take 2 tablets  (650 mg total) by mouth every 6 (six) hours as needed for mild pain, fever or headache (or Fever >/= 101). 12 tablet 0   amLODipine (NORVASC) 10 MG tablet Take 1 tablet (10 mg total) by mouth daily. 90 tablet 3   anastrozole (ARIMIDEX) 1 MG tablet TAKE 1 TABLET BY MOUTH EVERY DAY 30 tablet 3   apixaban (ELIQUIS) 5 MG TABS tablet Take 1 tablet (5 mg total) by mouth 2 (two) times daily. Start after you complete the initial starter pack of Eliquis 60 tablet 5   aspirin 81 MG tablet Take 1 tablet (81 mg total) by mouth daily with breakfast. 30 tablet 3   carvedilol (COREG) 12.5 MG tablet Take 1 tablet (12.5 mg total) by mouth 2 (two) times daily with a meal. 90 tablet 3   Cholecalciferol (VITAMIN D3) 125 MCG (5000 UT) CAPS TAKE 1 CAPSULE BY MOUTH ONCE DAILY (Patient taking differently: Take 1 capsule by mouth daily.) 90 capsule 1   ferrous sulfate 325 (65 FE) MG tablet Take 1 tablet (325 mg total) by mouth daily with breakfast. 30 tablet 0   furosemide (LASIX) 20 MG tablet TAKE 1 TABLET BY MOUTH EVERY DAY AS NEEDED AS DIRECTED 30 tablet 1   gabapentin (NEURONTIN) 100 MG capsule TAKE 1 CAPSULE (100 MG TOTAL) BY MOUTH THREE TIMES DAILY. 90 capsule 2   HYDROcodone-acetaminophen (NORCO/VICODIN) 5-325 MG tablet Take 1 tablet by mouth every 12 (twelve) hours as needed for moderate pain. 20 tablet 0   isosorbide mononitrate (IMDUR) 30 MG 24 hr tablet  Take 1 tablet (30 mg total) by mouth daily. 90 tablet 2   levothyroxine (SYNTHROID) 125 MCG tablet Take 1 tablet (125 mcg total) by mouth daily before breakfast. 90 tablet 1   nitroGLYCERIN (NITROSTAT) 0.4 MG SL tablet Place 1 tablet (0.4 mg total) under the tongue every 5 (five) minutes x 3 doses as needed. 25 tablet 3   Omega-3 Fatty Acids 300 MG CAPS Take 1 capsule (300 mg total) by mouth 2 (two) times daily.     palbociclib (IBRANCE) 100 MG tablet Take 1 tablet (100 mg total) by mouth daily. Take for 21 days on, 7 days off, repeat every 28 days. 21 tablet 0    pantoprazole (PROTONIX) 40 MG tablet Take 1 tablet (40 mg total) by mouth daily. 30 tablet 3   potassium chloride (KLOR-CON) 10 MEQ tablet Take 1 tablet (10 mEq total) by mouth daily. 90 tablet 1   rosuvastatin (CRESTOR) 40 MG tablet Take 1 tablet (40 mg total) by mouth daily. 90 tablet 2   senna-docusate (SENOKOT-S) 8.6-50 MG tablet Take 2 tablets by mouth at bedtime. 60 tablet 1   No current facility-administered medications for this visit.     PHYSICAL EXAMINATION: Performance status (ECOG): 2 - Symptomatic, <50% confined to bed  There were no vitals filed for this visit. Wt Readings from Last 3 Encounters:  11/05/20 219 lb (99.3 kg)  09/26/20 219 lb (99.3 kg)  09/13/20 216 lb 6.4 oz (98.2 kg)   Physical Exam Vitals reviewed.  Constitutional:      Appearance: Normal appearance.  Cardiovascular:     Rate and Rhythm: Normal rate and regular rhythm.     Pulses: Normal pulses.     Heart sounds: Normal heart sounds.  Pulmonary:     Effort: Pulmonary effort is normal.     Breath sounds: Normal breath sounds.  Abdominal:     Palpations: Abdomen is soft. There is no hepatomegaly, splenomegaly or mass.     Tenderness: There is no abdominal tenderness.  Neurological:     General: No focal deficit present.     Mental Status: She is alert and oriented to person, place, and time.  Psychiatric:        Mood and Affect: Mood normal.        Behavior: Behavior normal.    Breast Exam Chaperone: Thana Ates     LABORATORY DATA:  I have reviewed the data as listed CMP Latest Ref Rng & Units 09/26/2020 09/21/2020 08/07/2020  Glucose 70 - 99 mg/dL 101(H) - 110(H)  BUN 8 - 23 mg/dL 21 - 21  Creatinine 0.44 - 1.00 mg/dL 1.06(H) 1.30(H) 1.37(H)  Sodium 135 - 145 mmol/L 142 - 137  Potassium 3.5 - 5.1 mmol/L 4.0 - 3.5  Chloride 98 - 111 mmol/L 110 - 108  CO2 22 - 32 mmol/L 25 - 21(L)  Calcium 8.9 - 10.3 mg/dL 9.5 - 8.8(L)  Total Protein 6.5 - 8.1 g/dL 7.6 - 7.5  Total Bilirubin 0.3 - 1.2  mg/dL 0.7 - 0.4  Alkaline Phos 38 - 126 U/L 66 - 98  AST 15 - 41 U/L 18 - 17  ALT 0 - 44 U/L 11 - 12   Lab Results  Component Value Date   CAN153 35.9 (H) 09/26/2020   CAN153 33.6 (H) 08/07/2020   CAN153 40.2 (H) 06/26/2020   Lab Results  Component Value Date   WBC 6.1 09/26/2020   HGB 11.2 (L) 09/26/2020   HCT 36.8 09/26/2020   MCV  82.5 09/26/2020   PLT 161 09/26/2020   NEUTROABS 4.5 09/26/2020    ASSESSMENT:  1.  Recurrent breast carcinoma with metastases to bones: -History of right breast cancer in 1987, status post right mastectomy requiring no adjuvant chemo or XRT.  Was not on tamoxifen. -History of left breast cancer in 2007, status post mastectomy by Dr. Carlis Abbott at [facility], placed on tamoxifen, discontinued around 2015. -Recent upper back pain, weight loss of 20 pounds in the last 3 to 6 months. -CT angio on 04/04/2020 showed irregular mass in the left chest wall concerning for recurrent breast cancer measuring 4.5 x 3.1 cm. -Also showed lytic lesions in T1 and T3 vertebral bodies as well as manubrium. -Left chest wall mass biopsy on 04/05/2020 consistent with adenocarcinoma compatible with recurrent breast carcinoma.  ER 95% positive, PR 40% positive, HER2 2+, negative by FISH. - PET scan on 05/15/2020 shows hypermetabolic mass in the left anterior chest wall, mild hypermetabolic thoracic lymphadenopathy, diffuse bone metastasis throughout the spine, sternum, both shoulders, pelvis, proximal right femur which are both lytic and sclerotic. - Anastrozole and palbociclib started around 05/21/2020.   2.  Left upper extremity DVT: -Left upper extremity Doppler on 04/04/2020 + for DVT involving left internal jugular vein, left subclavian vein, left axillary vein, left brachial and left basilic vein.   3.  Family history: - No major family history of malignancies other than her brother with throat cancer.   PLAN:  1.  Metastatic breast cancer to the bones, ER/PR  positive: - She is tolerating anastrozole very well.  Very minor hot flashes.  No major arthralgias or myalgias. - She lives at home with her granddaughter.  She does all her ADLs and some IADLs. - She has never started palbociclib. - We talked about importance of combining CDK 4 inhibitor with anastrozole with improvement in PFS. - CT CAP from 09/21/2020 showed similar appearance of left chest wall mass.  Interval sclerosis of diffuse bony metastatic disease. - Labs reviewed by me today shows normal LFTs.  Creatinine is mildly elevated at 1.16.  CA 15-3 was 35.9 and CA 27 at 29 was normal. - We will continue anastrozole single agent at this time. - RTC 2 weeks for follow-up.  We will plan to repeat CT CAP and tumor markers. - If there is any worsening we will consider adding CDK 4 inhibitor.   2.  Left upper extremity DVT: - Continue Eliquis.  No bleeding issues reported.   3.  Right chest wall and scapular pain: - This has improved since the start of anastrozole.   4.  Bone metastasis: - Calcium today is 9.6. - Continue monthly denosumab.  Continue calcium and vitamin D supplements.  5.  Left kidney mass: - She has partially exophytic lesion of the inferior pole of the left kidney potentially representing a complex cyst.  This has been stable.  We will follow-up on subsequent scans.  Breast Cancer therapy associated bone loss: I have recommended calcium, Vitamin D and weight bearing exercises.  Orders placed this encounter:  No orders of the defined types were placed in this encounter.   The patient has a good understanding of the overall plan. She agrees with it. She will call with any problems that may develop before the next visit here.  Derek Jack, MD [place] [phone]   I, Thana Ates, am acting as a scribe for Dr. Derek Jack.  IDerek Jack MD, have reviewed the above documentation  for accuracy and completeness, and I agree  with the above.

## 2020-11-14 ENCOUNTER — Other Ambulatory Visit: Payer: Self-pay

## 2020-11-14 ENCOUNTER — Inpatient Hospital Stay (HOSPITAL_COMMUNITY): Payer: Medicare PPO | Admitting: Hematology

## 2020-11-14 ENCOUNTER — Inpatient Hospital Stay (HOSPITAL_COMMUNITY): Payer: Medicare PPO

## 2020-11-14 VITALS — HR 62 | Temp 98.1°F | Resp 18 | Wt 216.0 lb

## 2020-11-14 DIAGNOSIS — C50912 Malignant neoplasm of unspecified site of left female breast: Secondary | ICD-10-CM | POA: Diagnosis not present

## 2020-11-14 DIAGNOSIS — Z79899 Other long term (current) drug therapy: Secondary | ICD-10-CM | POA: Diagnosis not present

## 2020-11-14 DIAGNOSIS — Z86718 Personal history of other venous thrombosis and embolism: Secondary | ICD-10-CM | POA: Diagnosis not present

## 2020-11-14 DIAGNOSIS — C7951 Secondary malignant neoplasm of bone: Secondary | ICD-10-CM | POA: Diagnosis not present

## 2020-11-14 DIAGNOSIS — N2889 Other specified disorders of kidney and ureter: Secondary | ICD-10-CM | POA: Diagnosis not present

## 2020-11-14 DIAGNOSIS — Z7982 Long term (current) use of aspirin: Secondary | ICD-10-CM | POA: Diagnosis not present

## 2020-11-14 DIAGNOSIS — I82622 Acute embolism and thrombosis of deep veins of left upper extremity: Secondary | ICD-10-CM | POA: Diagnosis not present

## 2020-11-14 DIAGNOSIS — R232 Flushing: Secondary | ICD-10-CM | POA: Diagnosis not present

## 2020-11-14 DIAGNOSIS — Z170 Estrogen receptor positive status [ER+]: Secondary | ICD-10-CM | POA: Diagnosis not present

## 2020-11-14 LAB — COMPREHENSIVE METABOLIC PANEL
ALT: 12 U/L (ref 0–44)
AST: 16 U/L (ref 15–41)
Albumin: 3.8 g/dL (ref 3.5–5.0)
Alkaline Phosphatase: 53 U/L (ref 38–126)
Anion gap: 7 (ref 5–15)
BUN: 22 mg/dL (ref 8–23)
CO2: 23 mmol/L (ref 22–32)
Calcium: 9.6 mg/dL (ref 8.9–10.3)
Chloride: 107 mmol/L (ref 98–111)
Creatinine, Ser: 1.16 mg/dL — ABNORMAL HIGH (ref 0.44–1.00)
GFR, Estimated: 47 mL/min — ABNORMAL LOW (ref >60)
Glucose, Bld: 103 mg/dL — ABNORMAL HIGH (ref 70–99)
Potassium: 3.8 mmol/L (ref 3.5–5.1)
Sodium: 137 mmol/L (ref 135–145)
Total Bilirubin: 0.6 mg/dL (ref 0.3–1.2)
Total Protein: 7.5 g/dL (ref 6.5–8.1)

## 2020-11-14 LAB — MAGNESIUM: Magnesium: 2.2 mg/dL (ref 1.7–2.4)

## 2020-11-14 LAB — VITAMIN D 25 HYDROXY (VIT D DEFICIENCY, FRACTURES): Vit D, 25-Hydroxy: 56.55 ng/mL (ref 30–100)

## 2020-11-14 MED ORDER — DENOSUMAB 120 MG/1.7ML ~~LOC~~ SOLN
120.0000 mg | Freq: Once | SUBCUTANEOUS | Status: AC
  Administered 2020-11-14: 120 mg via SUBCUTANEOUS
  Filled 2020-11-14: qty 1.7

## 2020-11-14 NOTE — Patient Instructions (Signed)
[facility]  Discharge Instructions: Thank you for choosing [facility] to provide your oncology and hematology care.  If you have a lab appointment with the Cancer Center, please come in thru the Main Entrance and check in at the main information desk.  Wear comfortable clothing and clothing appropriate for easy access to any Portacath or PICC line.   We strive to give you quality time with your provider. You may need to reschedule your appointment if you arrive late (15 or more minutes).  Arriving late affects you and other patients whose appointments are after yours.  Also, if you miss three or more appointments without notifying the office, you may be dismissed from the clinic at the provider's discretion.      For prescription refill requests, have your pharmacy contact our office and allow 72 hours for refills to be completed.    Today you received the following chemotherapy and/or immunotherapy agents Xgeva      To help prevent nausea and vomiting after your treatment, we encourage you to take your nausea medication as directed.  BELOW ARE SYMPTOMS THAT SHOULD BE REPORTED IMMEDIATELY: *FEVER GREATER THAN 100.4 F (38 C) OR HIGHER *CHILLS OR SWEATING *NAUSEA AND VOMITING THAT IS NOT CONTROLLED WITH YOUR NAUSEA MEDICATION *UNUSUAL SHORTNESS OF BREATH *UNUSUAL BRUISING OR BLEEDING *URINARY PROBLEMS (pain or burning when urinating, or frequent urination) *BOWEL PROBLEMS (unusual diarrhea, constipation, pain near the anus) TENDERNESS IN MOUTH AND THROAT WITH OR WITHOUT PRESENCE OF ULCERS (sore throat, sores in mouth, or a toothache) UNUSUAL RASH, SWELLING OR PAIN  UNUSUAL VAGINAL DISCHARGE OR ITCHING   Items with * indicate a potential emergency and should be followed up as soon as possible or go to the Emergency Department if any problems should occur.  Please show the CHEMOTHERAPY ALERT CARD or IMMUNOTHERAPY ALERT CARD at check-in to the Emergency Department  and triage nurse.  Should you have questions after your visit or need to cancel or reschedule your appointment, please contact [facility] [phone]  and follow the prompts.  Office hours are 8:00 a.m. to 4:30 p.m. Monday - Friday. Please note that voicemails left after 4:00 p.m. may not be returned until the following business day.  We are closed weekends and major holidays. You have access to a nurse at all times for urgent questions. Please call the main number to the clinic [phone] and follow the prompts.  For any non-urgent questions, you may also contact your provider using MyChart. We now offer e-Visits for anyone 18 and older to request care online for non-urgent symptoms. For details visit [email or web address].   Also download the MyChart app! Go to the app store, search "MyChart", open the app, select [address], and log in with your MyChart username and password.  Due to Covid, a mask is required upon entering the hospital/clinic. If you do not have a mask, one will be given to you upon arrival. For doctor visits, patients may have 1 support person aged 18 or older with them. For treatment visits, patients cannot have anyone with them due to current Covid guidelines and our immunocompromised population.  

## 2020-11-14 NOTE — Patient Instructions (Signed)
[place] at [facility] Discharge Instructions  You were seen and examined today by Dr. Delton Coombes. Continue anastrozole as prescribed.  You will receive Xgeva injection today. Continue Calcium + Vitamin D.  Return as scheduled for labs, CT scan, and office visit.     Thank you for choosing [name] at [facility] to provide your oncology and hematology care.  To afford each patient quality time with our provider, please arrive at least 15 minutes before your scheduled appointment time.   If you have a lab appointment with the [facility] please come in thru the Main Entrance and check in at the main information desk.  You need to re-schedule your appointment should you arrive 10 or more minutes late.  We strive to give you quality time with our providers, and arriving late affects you and other patients whose appointments are after yours.  Also, if you no show three or more times for appointments you may be dismissed from the clinic at the providers discretion.     Again, thank you for choosing [facility].  Our hope is that these requests will decrease the amount of time that you wait before being seen by our physicians.       _____________________________________________________________  Should you have questions after your visit to [facility], please contact our office at [phone] and follow the prompts.  Our office hours are 8:00 a.m. and 4:30 p.m. Monday - Friday.  Please note that voicemails left after 4:00 p.m. may not be returned until the following business day.  We are closed weekends and major holidays.  You do have access to a nurse 24-7, just call the main number to the clinic [phone] and do not press any options, hold on the line and a nurse will answer the phone.    For prescription refill requests, have your pharmacy contact our office and allow 72 hours.    Due to Covid, you will need to  wear a mask upon entering the hospital. If you do not have a mask, a mask will be given to you at the Main Entrance upon arrival. For doctor visits, patients may have 1 support person age 71 or older with them. For treatment visits, patients can not have anyone with them due to social distancing guidelines and our immunocompromised population.

## 2020-11-14 NOTE — Progress Notes (Signed)
Patient presents today for Xgeva injection per providers order.  Calcium noted to be 9.6.  Patient has had no jaw pain, no dental work recent or up coming, and is taking Calcium and Vitamin D supplements.   Stable during administration without incident; injection site WNL; see MAR for injection details.  Patient tolerated procedure well and without incident.  No questions or complaints noted at this time. Discharge from clinic via wheelchair in stable condition.  Alert and oriented X 3.  Follow up with [facility] as scheduled.

## 2020-11-15 LAB — CANCER ANTIGEN 15-3: CA 15-3: 36.2 U/mL — ABNORMAL HIGH (ref 0.0–25.0)

## 2020-11-15 LAB — CANCER ANTIGEN 27.29: CA 27.29: 51.4 U/mL — ABNORMAL HIGH (ref 0.0–38.6)

## 2020-11-20 ENCOUNTER — Telehealth: Payer: Self-pay

## 2020-11-20 ENCOUNTER — Other Ambulatory Visit: Payer: Self-pay | Admitting: *Deleted

## 2020-11-20 MED ORDER — CARVEDILOL 12.5 MG PO TABS: 12.5000 mg | ORAL_TABLET | Freq: Two times a day (BID) | ORAL | 3 refills | Status: DC

## 2020-11-20 NOTE — Telephone Encounter (Signed)
Pt medication sent to pharmacy  

## 2020-11-20 NOTE — Telephone Encounter (Signed)
Patient daughter Robin [name]) called needs med refill carvedilol (COREG) 12.5 MG tablet  to be sent to [place], [place].

## 2020-11-27 ENCOUNTER — Other Ambulatory Visit: Payer: Self-pay

## 2020-11-27 ENCOUNTER — Encounter: Payer: Self-pay | Admitting: Internal Medicine

## 2020-11-27 ENCOUNTER — Ambulatory Visit (INDEPENDENT_AMBULATORY_CARE_PROVIDER_SITE_OTHER): Payer: Medicare PPO | Admitting: Internal Medicine

## 2020-11-27 DIAGNOSIS — R109 Unspecified abdominal pain: Secondary | ICD-10-CM | POA: Diagnosis not present

## 2020-11-27 MED ORDER — DICYCLOMINE HCL 10 MG PO CAPS: 10.0000 mg | ORAL_CAPSULE | Freq: Three times a day (TID) | ORAL | 0 refills | Status: DC | PRN

## 2020-11-27 NOTE — Patient Instructions (Signed)
Please take Bentyl for abdominal cramps.  Okay to take GasX for bloating.  Please maintain adequate hydration by taking at least 64 ounces of fluid in a day.

## 2020-11-27 NOTE — Progress Notes (Signed)
Virtual Visit via Telephone Note   This visit type was conducted due to national recommendations for restrictions regarding the COVID-19 Pandemic (e.g. social distancing) in an effort to limit this patient's exposure and mitigate transmission in our community.  Due to her co-morbid illnesses, this patient is at least at moderate risk for complications without adequate follow up.  This format is felt to be most appropriate for this patient at this time.  The patient did not have access to video technology/had technical difficulties with video requiring transitioning to audio format only (telephone).  All issues noted in this document were discussed and addressed.  No physical exam could be performed with this format.  Evaluation Performed:  Follow-up visit  Date:  11/27/2020   ID:  Robin [name], DOB 1940/06/24, MRN [id number]  Patient Location: Home Provider Location: Office/Clinic  Participants: Patient Location of Patient: Home Location of Provider: Telehealth Consent was obtain for visit to be over via telehealth. I verified that I am speaking with the correct person using two identifiers.  PCP:  [name], MD   Chief Complaint: Abdominal cramps  History of Present Illness:    Robin [name] is a 80 y.o. female who has a televisit for abdominal pain/cramps since yesterday. She denies any fever or chills. Denies any vomiting. Last BM was yesterday, denies melena or hematochezia. She reports having steak, salad and french fries on weekend. She had diarrhea for one day about a week ago, but had resolved after that.  The patient does not have symptoms concerning for COVID-19 infection (fever, chills, cough, or new shortness of breath).   Past Medical, Surgical, Social History, Allergies, and Medications have been Reviewed.  Past Medical History:  Diagnosis Date   Breast cancer ([place])    Tamoxifen started 2007   CAD (coronary artery disease)    DES second diagonal 2/12    Cardiomyopathy    Probable Takotsubo, LVEF 30-35% 2/12   Carotid artery disease ([facility])    Essential hypertension    Hepatic steatosis    Hyperlipidemia    Hypothyroidism    NSTEMI (non-ST elevated myocardial infarction) ([place])    2/12   Pancreas divisum    Stroke ([place])    (2000) residual left-sided weakness   Type 2 diabetes mellitus ([place])    Past Surgical History:  Procedure Laterality Date   ABDOMINAL HYSTERECTOMY     CAROTID ENDARTERECTOMY     Left   CHOLECYSTECTOMY     COLONOSCOPY WITH PROPOFOL N/A 05/03/2020   Procedure: COLONOSCOPY WITH PROPOFOL;  Surgeon: Daneil Dolin, MD;  [facility];  Service: Endoscopy;  Laterality: N/A;   ESOPHAGOGASTRODUODENOSCOPY (EGD) WITH PROPOFOL N/A 05/02/2020   Procedure: ESOPHAGOGASTRODUODENOSCOPY (EGD) WITH PROPOFOL;  Surgeon: Rogene Houston, MD;  [facility];  Service: Endoscopy;  Laterality: N/A;   GIVENS CAPSULE STUDY N/A 05/04/2020   Procedure: GIVENS CAPSULE STUDY;  Surgeon: Harvel Quale, MD;  Location: [facility];  Service: Gastroenterology;  Laterality: N/A;   JOINT REPLACEMENT     Right knee replacement- [facility]. Sebastian [facility]   MASTECTOMY     Bilateral   POLYPECTOMY  05/03/2020   Procedure: POLYPECTOMY;  Surgeon: Daneil Dolin, MD;  [facility]: [facility];  Service: Endoscopy;;     Current Meds  Medication Sig   acetaminophen (TYLENOL) 325 MG tablet Take 2 tablets (650 mg total) by mouth every 6 (six) hours as needed for mild pain, fever or headache (  or Fever >/= 101).   amLODipine (NORVASC) 10 MG tablet Take 1 tablet (10 mg total) by mouth daily.   anastrozole (ARIMIDEX) 1 MG tablet TAKE 1 TABLET BY MOUTH EVERY DAY   apixaban (ELIQUIS) 5 MG TABS tablet Take 1 tablet (5 mg total) by mouth 2 (two) times daily. Start after you complete the initial starter pack of Eliquis   aspirin 81 MG tablet Take 1 tablet (81 mg total) by mouth daily with breakfast.   carvedilol (COREG) 12.5 MG tablet  Take 1 tablet (12.5 mg total) by mouth 2 (two) times daily with a meal.   Cholecalciferol (VITAMIN D3) 125 MCG (5000 UT) CAPS TAKE 1 CAPSULE BY MOUTH ONCE DAILY (Patient taking differently: Take 1 capsule by mouth daily.)   ferrous sulfate 325 (65 FE) MG tablet Take 1 tablet (325 mg total) by mouth daily with breakfast.   furosemide (LASIX) 20 MG tablet TAKE 1 TABLET BY MOUTH EVERY DAY AS NEEDED AS DIRECTED   gabapentin (NEURONTIN) 100 MG capsule TAKE 1 CAPSULE (100 MG TOTAL) BY MOUTH THREE TIMES DAILY.   HYDROcodone-acetaminophen (NORCO/VICODIN) 5-325 MG tablet Take 1 tablet by mouth every 12 (twelve) hours as needed for moderate pain.   isosorbide mononitrate (IMDUR) 30 MG 24 hr tablet Take 1 tablet (30 mg total) by mouth daily.   levothyroxine (SYNTHROID) 125 MCG tablet Take 1 tablet (125 mcg total) by mouth daily before breakfast.   nitroGLYCERIN (NITROSTAT) 0.4 MG SL tablet Place 1 tablet (0.4 mg total) under the tongue every 5 (five) minutes x 3 doses as needed.   Omega-3 Fatty Acids 300 MG CAPS Take 1 capsule (300 mg total) by mouth 2 (two) times daily.   pantoprazole (PROTONIX) 40 MG tablet Take 1 tablet (40 mg total) by mouth daily.   potassium chloride (KLOR-CON) 10 MEQ tablet Take 1 tablet (10 mEq total) by mouth daily.   rosuvastatin (CRESTOR) 40 MG tablet Take 1 tablet (40 mg total) by mouth daily.   senna-docusate (SENOKOT-S) 8.6-50 MG tablet Take 2 tablets by mouth at bedtime.     Allergies:   Codeine and Tramadol   ROS:   Please see the history of present illness.     All other systems reviewed and are negative.   Labs/Other Tests and Data Reviewed:    Recent Labs: 04/04/2020: B Natriuretic Peptide 92.0 07/19/2020: TSH 0.173 09/26/2020: Hemoglobin 11.2; Platelets 161 11/14/2020: ALT 12; BUN 22; Creatinine, Ser 1.16; Magnesium 2.2; Potassium 3.8; Sodium 137   Recent Lipid Panel Lab Results  Component Value Date/Time   CHOL 145 03/01/2020 10:08 AM   TRIG 109 03/01/2020  10:08 AM   HDL 46 (L) 03/01/2020 10:08 AM   CHOLHDL 3.2 03/01/2020 10:08 AM   LDLCALC 80 03/01/2020 10:08 AM    Wt Readings from Last 3 Encounters:  11/14/20 216 lb (98 kg)  11/05/20 219 lb (99.3 kg)  09/26/20 219 lb (99.3 kg)     ASSESSMENT & PLAN:    Abdominal cramping Diarrhea resolved now Could be acute gastroenteritis, usually self-resolving Advised for proper hydration Bentyl PRN for abdominal cramps GasX for bloating  Time:   Today, I have spent 9 minutes reviewing the chart, including problem list, medications, and with the patient with telehealth technology discussing the above problems.   Medication Adjustments/Labs and Tests Ordered: Current medicines are reviewed at length with the patient today.  Concerns regarding medicines are outlined above.   Tests Ordered: No orders of the defined types were placed in this encounter.  Medication Changes: No orders of the defined types were placed in this encounter.    Note: This dictation was prepared with Dragon dictation along with smaller phrase technology. Similar sounding words can be transcribed inadequately or may not be corrected upon review. Any transcriptional errors that result from this process are unintentional.      Disposition:  Follow up  Signed, [name], MD  11/27/2020 9:12 AM     [facility]

## 2020-11-28 ENCOUNTER — Telehealth: Payer: Medicare PPO | Admitting: Internal Medicine

## 2020-12-03 ENCOUNTER — Other Ambulatory Visit: Payer: Self-pay | Admitting: *Deleted

## 2020-12-03 ENCOUNTER — Telehealth: Payer: Self-pay

## 2020-12-03 MED ORDER — APIXABAN 5 MG PO TABS: 5.0000 mg | ORAL_TABLET | Freq: Two times a day (BID) | ORAL | 5 refills | Status: DC

## 2020-12-03 NOTE — Telephone Encounter (Signed)
Pt medication sent to pharmacy  

## 2020-12-03 NOTE — Telephone Encounter (Signed)
Patient called need med refill   Eliquis 5 mg   Pharmacy  [facility]  [address] [place], [place] [address]  Phone:  [phone]  Fax:  [phone]

## 2020-12-11 ENCOUNTER — Inpatient Hospital Stay (HOSPITAL_COMMUNITY): Payer: Medicare PPO | Attending: Hematology

## 2020-12-11 ENCOUNTER — Other Ambulatory Visit: Payer: Self-pay

## 2020-12-11 ENCOUNTER — Inpatient Hospital Stay (HOSPITAL_COMMUNITY): Payer: Medicare PPO

## 2020-12-11 VITALS — BP 152/69 | HR 59 | Temp 97.0°F | Resp 18 | Wt 213.4 lb

## 2020-12-11 DIAGNOSIS — C7951 Secondary malignant neoplasm of bone: Secondary | ICD-10-CM | POA: Diagnosis not present

## 2020-12-11 DIAGNOSIS — C50912 Malignant neoplasm of unspecified site of left female breast: Secondary | ICD-10-CM

## 2020-12-11 DIAGNOSIS — Z170 Estrogen receptor positive status [ER+]: Secondary | ICD-10-CM | POA: Insufficient documentation

## 2020-12-11 LAB — COMPREHENSIVE METABOLIC PANEL
ALT: 14 U/L (ref 0–44)
AST: 19 U/L (ref 15–41)
Albumin: 3.9 g/dL (ref 3.5–5.0)
Alkaline Phosphatase: 62 U/L (ref 38–126)
Anion gap: 8 (ref 5–15)
BUN: 21 mg/dL (ref 8–23)
CO2: 23 mmol/L (ref 22–32)
Calcium: 9.5 mg/dL (ref 8.9–10.3)
Chloride: 108 mmol/L (ref 98–111)
Creatinine, Ser: 1.11 mg/dL — ABNORMAL HIGH (ref 0.44–1.00)
GFR, Estimated: 50 mL/min — ABNORMAL LOW (ref >60)
Glucose, Bld: 103 mg/dL — ABNORMAL HIGH (ref 70–99)
Potassium: 4 mmol/L (ref 3.5–5.1)
Sodium: 139 mmol/L (ref 135–145)
Total Bilirubin: 0.6 mg/dL (ref 0.3–1.2)
Total Protein: 7.6 g/dL (ref 6.5–8.1)

## 2020-12-11 LAB — CBC WITH DIFFERENTIAL/PLATELET
Abs Immature Granulocytes: 0.02 10*3/uL (ref 0.00–0.07)
Basophils Absolute: 0 10*3/uL (ref 0.0–0.1)
Basophils Relative: 0 %
Eosinophils Absolute: 0.2 10*3/uL (ref 0.0–0.5)
Eosinophils Relative: 3 %
HCT: 33.6 % — ABNORMAL LOW (ref 36.0–46.0)
Hemoglobin: 10.7 g/dL — ABNORMAL LOW (ref 12.0–15.0)
Immature Granulocytes: 0 %
Lymphocytes Relative: 14 %
Lymphs Abs: 1 10*3/uL (ref 0.7–4.0)
MCH: 25.8 pg — ABNORMAL LOW (ref 26.0–34.0)
MCHC: 31.8 g/dL (ref 30.0–36.0)
MCV: 81 fL (ref 80.0–100.0)
Monocytes Absolute: 0.6 10*3/uL (ref 0.1–1.0)
Monocytes Relative: 9 %
Neutro Abs: 5.1 10*3/uL (ref 1.7–7.7)
Neutrophils Relative %: 74 %
Platelets: 163 10*3/uL (ref 150–400)
RBC: 4.15 MIL/uL (ref 3.87–5.11)
RDW: 17.2 % — ABNORMAL HIGH (ref 11.5–15.5)
WBC: 6.9 10*3/uL (ref 4.0–10.5)
nRBC: 0 % (ref 0.0–0.2)

## 2020-12-11 MED ORDER — DENOSUMAB 120 MG/1.7ML ~~LOC~~ SOLN
120.0000 mg | Freq: Once | SUBCUTANEOUS | Status: AC
  Administered 2020-12-11: 120 mg via SUBCUTANEOUS
  Filled 2020-12-11: qty 1.7

## 2020-12-11 NOTE — Progress Notes (Signed)
Robin [name] presents today for Xgeva injection per the provider's orders.  Patient has had no prior or upcoming dental work, no jaw pain and has been taking Calcium and vitamin D supplements.  Stable during administration without incident; injection site WNL; see MAR for injection details.  Patient tolerated procedure well and without incident.  No questions or complaints noted at this time.  Discharge from clinic via wheelchair in stable condition.  Alert and oriented X 3.  Follow up with [facility] as scheduled.

## 2020-12-11 NOTE — Patient Instructions (Signed)
[facility]  Discharge Instructions: Thank you for choosing [facility] to provide your oncology and hematology care.  If you have a lab appointment with the Cancer Center, please come in thru the Main Entrance and check in at the main information desk.  Wear comfortable clothing and clothing appropriate for easy access to any Portacath or PICC line.   We strive to give you quality time with your provider. You may need to reschedule your appointment if you arrive late (15 or more minutes).  Arriving late affects you and other patients whose appointments are after yours.  Also, if you miss three or more appointments without notifying the office, you may be dismissed from the clinic at the provider's discretion.      For prescription refill requests, have your pharmacy contact our office and allow 72 hours for refills to be completed.    Today you received the following chemotherapy and/or immunotherapy agents Xgeva      To help prevent nausea and vomiting after your treatment, we encourage you to take your nausea medication as directed.  BELOW ARE SYMPTOMS THAT SHOULD BE REPORTED IMMEDIATELY: *FEVER GREATER THAN 100.4 F (38 C) OR HIGHER *CHILLS OR SWEATING *NAUSEA AND VOMITING THAT IS NOT CONTROLLED WITH YOUR NAUSEA MEDICATION *UNUSUAL SHORTNESS OF BREATH *UNUSUAL BRUISING OR BLEEDING *URINARY PROBLEMS (pain or burning when urinating, or frequent urination) *BOWEL PROBLEMS (unusual diarrhea, constipation, pain near the anus) TENDERNESS IN MOUTH AND THROAT WITH OR WITHOUT PRESENCE OF ULCERS (sore throat, sores in mouth, or a toothache) UNUSUAL RASH, SWELLING OR PAIN  UNUSUAL VAGINAL DISCHARGE OR ITCHING   Items with * indicate a potential emergency and should be followed up as soon as possible or go to the Emergency Department if any problems should occur.  Please show the CHEMOTHERAPY ALERT CARD or IMMUNOTHERAPY ALERT CARD at check-in to the Emergency Department  and triage nurse.  Should you have questions after your visit or need to cancel or reschedule your appointment, please contact [facility] [phone]  and follow the prompts.  Office hours are 8:00 a.m. to 4:30 p.m. Monday - Friday. Please note that voicemails left after 4:00 p.m. may not be returned until the following business day.  We are closed weekends and major holidays. You have access to a nurse at all times for urgent questions. Please call the main number to the clinic [phone] and follow the prompts.  For any non-urgent questions, you may also contact your provider using MyChart. We now offer e-Visits for anyone 18 and older to request care online for non-urgent symptoms. For details visit [email or web address].   Also download the MyChart app! Go to the app store, search "MyChart", open the app, select [facility], and log in with your MyChart username and password.  Due to Covid, a mask is required upon entering the hospital/clinic. If you do not have a mask, one will be given to you upon arrival. For doctor visits, patients may have 1 support person aged 18 or older with them. For treatment visits, patients cannot have anyone with them due to current Covid guidelines and our immunocompromised population.  

## 2020-12-11 NOTE — Progress Notes (Signed)
Patient presents today for Xgeva injection. Calcium 9.5. Patient currently taking Cholecalciferol (Vitamin D3) 125 mcg as prescribed.  Vital signs within parameters for treatment.

## 2020-12-12 ENCOUNTER — Ambulatory Visit: Payer: Medicare PPO | Admitting: Nurse Practitioner

## 2020-12-12 ENCOUNTER — Ambulatory Visit (HOSPITAL_COMMUNITY): Payer: Medicare PPO

## 2020-12-12 ENCOUNTER — Other Ambulatory Visit (HOSPITAL_COMMUNITY): Payer: Medicare PPO

## 2020-12-15 ENCOUNTER — Other Ambulatory Visit: Payer: Self-pay | Admitting: Internal Medicine

## 2020-12-27 ENCOUNTER — Other Ambulatory Visit (HOSPITAL_COMMUNITY): Payer: Self-pay | Admitting: Hematology

## 2021-01-03 ENCOUNTER — Ambulatory Visit (HOSPITAL_COMMUNITY): Payer: Medicare PPO

## 2021-01-03 ENCOUNTER — Inpatient Hospital Stay (HOSPITAL_COMMUNITY): Payer: Medicare PPO

## 2021-01-09 ENCOUNTER — Ambulatory Visit (HOSPITAL_COMMUNITY): Payer: Medicare PPO | Admitting: Hematology

## 2021-01-09 ENCOUNTER — Other Ambulatory Visit (HOSPITAL_COMMUNITY): Payer: Medicare PPO

## 2021-01-15 ENCOUNTER — Other Ambulatory Visit: Payer: Self-pay | Admitting: Nurse Practitioner

## 2021-01-15 ENCOUNTER — Other Ambulatory Visit: Payer: Self-pay | Admitting: Family Medicine

## 2021-01-15 DIAGNOSIS — E039 Hypothyroidism, unspecified: Secondary | ICD-10-CM

## 2021-01-30 ENCOUNTER — Encounter: Payer: Self-pay | Admitting: Internal Medicine

## 2021-01-30 ENCOUNTER — Telehealth: Payer: Self-pay

## 2021-01-30 ENCOUNTER — Ambulatory Visit (INDEPENDENT_AMBULATORY_CARE_PROVIDER_SITE_OTHER): Payer: Medicare PPO | Admitting: Internal Medicine

## 2021-01-30 ENCOUNTER — Other Ambulatory Visit: Payer: Self-pay

## 2021-01-30 DIAGNOSIS — M1611 Unilateral primary osteoarthritis, right hip: Secondary | ICD-10-CM

## 2021-01-30 DIAGNOSIS — M4722 Other spondylosis with radiculopathy, cervical region: Secondary | ICD-10-CM

## 2021-01-30 MED ORDER — GABAPENTIN 100 MG PO CAPS: 100.0000 mg | ORAL_CAPSULE | Freq: Every day | ORAL | 3 refills | Status: DC

## 2021-01-30 NOTE — Assessment & Plan Note (Signed)
Previous MRI of cervical spine reviewed Was seen by Dr. Aline Brochure in the past Continue gabapentin 100 mg nightly instead of as needed Refilled after reviewing PDMP

## 2021-01-30 NOTE — Telephone Encounter (Signed)
Please make pt phone visit today to discuss

## 2021-01-30 NOTE — Telephone Encounter (Signed)
Patient daughter Filiberto Pinks called, the patient did not quite understand how to take medicine that was prescribe.  [name] call back  [phone]

## 2021-01-30 NOTE — Progress Notes (Signed)
Virtual Visit via Telephone Note   This visit type was conducted due to national recommendations for restrictions regarding the COVID-19 Pandemic (e.g. social distancing) in an effort to limit this patient's exposure and mitigate transmission in our community.  Due to her co-morbid illnesses, this patient is at least at moderate risk for complications without adequate follow up.  This format is felt to be most appropriate for this patient at this time.  The patient did not have access to video technology/had technical difficulties with video requiring transitioning to audio format only (telephone).  All issues noted in this document were discussed and addressed.  No physical exam could be performed with this format.  Evaluation Performed:  Follow-up visit  Date:  01/30/2021   ID:  Robin [name], DOB 1940-02-04, MRN [id number]  Patient Location: Home Provider Location: Office/Clinic  Participants: Patient Location of Patient: Home Location of Provider: Telehealth Consent was obtain for visit to be over via telehealth. I verified that I am speaking with the correct person using two identifiers.  PCP:  [name], MD   Chief Complaint: B/l arm and leg pain  History of Present Illness:    Robin [name] is a 81 y.o. female who has a televisit for complaint of bilateral arms and legs pain, which is chronic.  Of note, she has history of cervical spondylosis with radiating pain to her arms.  She has tolerated gabapentin in the past, but she has been taking it as as needed.  She has chronic, intermittent numbness of the arms.  She denies any recent injury or fall.  The patient does not have symptoms concerning for COVID-19 infection (fever, chills, cough, or new shortness of breath).   Past Medical, Surgical, Social History, Allergies, and Medications have been Reviewed.  Past Medical History:  Diagnosis Date   Breast cancer ([place])    Tamoxifen started 2007   CAD (coronary  artery disease)    DES second diagonal 2/12   Cardiomyopathy    Probable Takotsubo, LVEF 30-35% 2/12   Carotid artery disease ([facility])    Essential hypertension    Hepatic steatosis    Hyperlipidemia    Hypothyroidism    NSTEMI (non-ST elevated myocardial infarction) ([place])    2/12   Pancreas divisum    Stroke ([place])    (2000) residual left-sided weakness   Type 2 diabetes mellitus ([place])    Past Surgical History:  Procedure Laterality Date   ABDOMINAL HYSTERECTOMY     CAROTID ENDARTERECTOMY     Left   CHOLECYSTECTOMY     COLONOSCOPY WITH PROPOFOL N/A 05/03/2020   Procedure: COLONOSCOPY WITH PROPOFOL;  Surgeon: Daneil Dolin, MD;  [facility];  Service: Endoscopy;  Laterality: N/A;   ESOPHAGOGASTRODUODENOSCOPY (EGD) WITH PROPOFOL N/A 05/02/2020   Procedure: ESOPHAGOGASTRODUODENOSCOPY (EGD) WITH PROPOFOL;  Surgeon: Rogene Houston, MD;  [facility];  Service: Endoscopy;  Laterality: N/A;   GIVENS CAPSULE STUDY N/A 05/04/2020   Procedure: GIVENS CAPSULE STUDY;  Surgeon: Harvel Quale, MD;  [facility]: [facility];  Service: Gastroenterology;  Laterality: N/A;   JOINT REPLACEMENT     Right knee replacement- [facility]   MASTECTOMY     Bilateral   POLYPECTOMY  05/03/2020   Procedure: POLYPECTOMY;  Surgeon: Daneil Dolin, MD;  [facility]: [facility];  Service: Endoscopy;;     Current Meds  Medication Sig   acetaminophen (TYLENOL) 325 MG tablet Take 2 tablets (650  mg total) by mouth every 6 (six) hours as needed for mild pain, fever or headache (or Fever >/= 101).   amLODipine (NORVASC) 10 MG tablet Take 1 tablet (10 mg total) by mouth daily.   anastrozole (ARIMIDEX) 1 MG tablet TAKE 1 TABLET BY MOUTH EVERY DAY   aspirin 81 MG tablet Take 1 tablet (81 mg total) by mouth daily with breakfast.   carvedilol (COREG) 12.5 MG tablet Take 1 tablet (12.5 mg total) by mouth 2 (two) times daily with a meal.   Cholecalciferol (VITAMIN D3) 125 MCG  (5000 UT) CAPS TAKE 1 CAPSULE BY MOUTH ONCE DAILY (Patient taking differently: Take 1 capsule by mouth daily.)   dicyclomine (BENTYL) 10 MG capsule Take 1 capsule (10 mg total) by mouth 3 (three) times daily as needed for spasms.   ferrous sulfate 325 (65 FE) MG tablet Take 1 tablet (325 mg total) by mouth daily with breakfast.   furosemide (LASIX) 20 MG tablet TAKE 1 TABLET BY MOUTH EVERY DAY AS NEEDED AS DIRECTED   HYDROcodone-acetaminophen (NORCO/VICODIN) 5-325 MG tablet Take 1 tablet by mouth every 12 (twelve) hours as needed for moderate pain.   isosorbide mononitrate (IMDUR) 30 MG 24 hr tablet Take 1 tablet (30 mg total) by mouth daily.   KLOR-CON M10 10 MEQ tablet TAKE 1 TABLET BY MOUTH EVERY DAY   nitroGLYCERIN (NITROSTAT) 0.4 MG SL tablet Place 1 tablet (0.4 mg total) under the tongue every 5 (five) minutes x 3 doses as needed.   Omega-3 Fatty Acids 300 MG CAPS Take 1 capsule (300 mg total) by mouth 2 (two) times daily.   pantoprazole (PROTONIX) 40 MG tablet Take 1 tablet (40 mg total) by mouth daily.   rosuvastatin (CRESTOR) 40 MG tablet Take 1 tablet (40 mg total) by mouth daily.   senna-docusate (SENOKOT-S) 8.6-50 MG tablet Take 2 tablets by mouth at bedtime.   SYNTHROID 125 MCG tablet TAKE 1 TABLET BY MOUTH DAILY BEFORE BREAKFAST.   [DISCONTINUED] apixaban (ELIQUIS) 5 MG TABS tablet Take 1 tablet (5 mg total) by mouth 2 (two) times daily. Start after you complete the initial starter pack of Eliquis   [DISCONTINUED] gabapentin (NEURONTIN) 100 MG capsule TAKE 1 CAPSULE (100 MG TOTAL) BY MOUTH THREE TIMES DAILY.     Allergies:   Codeine and Tramadol   ROS:   Please see the history of present illness.     All other systems reviewed and are negative.   Labs/Other Tests and Data Reviewed:    Recent Labs: 04/04/2020: B Natriuretic Peptide 92.0 07/19/2020: TSH 0.173 11/14/2020: Magnesium 2.2 12/11/2020: ALT 14; BUN 21; Creatinine, Ser 1.11; Hemoglobin 10.7; Platelets 163; Potassium  4.0; Sodium 139   Recent Lipid Panel Lab Results  Component Value Date/Time   CHOL 145 03/01/2020 10:08 AM   TRIG 109 03/01/2020 10:08 AM   HDL 46 (L) 03/01/2020 10:08 AM   CHOLHDL 3.2 03/01/2020 10:08 AM   LDLCALC 80 03/01/2020 10:08 AM    Wt Readings from Last 3 Encounters:  12/11/20 213 lb 6.4 oz (96.8 kg)  11/14/20 216 lb (98 kg)  11/05/20 219 lb (99.3 kg)     ASSESSMENT & PLAN:    Spondylosis of cervical spine with radiculopathy Previous MRI of cervical spine reviewed Was seen by Dr. Aline Brochure in the past Continue gabapentin 100 mg nightly instead of as needed Refilled after reviewing PDMP  OA (osteoarthritis) of hip Limited mobility due to OA of hip Tylenol as needed Gabapentin for neuropathic pain  Time:   Today, I have spent 9 minutes reviewing the chart, including problem list, medications, and with the patient with telehealth technology discussing the above problems.   Medication Adjustments/Labs and Tests Ordered: Current medicines are reviewed at length with the patient today.  Concerns regarding medicines are outlined above.   Tests Ordered: No orders of the defined types were placed in this encounter.   Medication Changes: Meds ordered this encounter  Medications   gabapentin (NEURONTIN) 100 MG capsule    Sig: Take 1 capsule (100 mg total) by mouth at bedtime.    Dispense:  90 capsule    Refill:  3     Note: This dictation was prepared with Dragon dictation along with smaller phrase technology. Similar sounding words can be transcribed inadequately or may not be corrected upon review. Any transcriptional errors that result from this process are unintentional.      Disposition:  Follow up  Signed, [name], MD  01/30/2021 11:06 AM     [place]

## 2021-01-30 NOTE — Telephone Encounter (Signed)
Patient daughter called need refill gabapentin (NEURONTIN) 100 MG capsule No longer gets from Dr Aline Brochure.  Pharmacy  [facility], [address]  [address] [place], [place] [address]  Phone:  [phone]  Fax:  [phone]  DEA #:  --

## 2021-01-30 NOTE — Assessment & Plan Note (Signed)
Limited mobility due to OA of hip Tylenol as needed Gabapentin for neuropathic pain

## 2021-01-30 NOTE — Telephone Encounter (Signed)
Notified daughter gabapentin should be taken daily eliquis should be stopped aspirin was ok to take with verbal understanding

## 2021-01-30 NOTE — Telephone Encounter (Signed)
LVM for daughter to call the office back

## 2021-01-30 NOTE — Patient Instructions (Signed)
Please start taking gabapentin 100 mg at nighttime instead of as needed.

## 2021-01-31 ENCOUNTER — Telehealth: Payer: Self-pay

## 2021-01-31 NOTE — Telephone Encounter (Signed)
Patient daughter [name] called needs to know did Dr Posey Pronto take her off of her blood thinner, needs some clarification. Please contact [name] [phone].

## 2021-01-31 NOTE — Telephone Encounter (Signed)
Robin [name] is asking why?  Needs a reason why no more blood thinner.  [name] request clinic nurse to return her call back [phone].

## 2021-01-31 NOTE — Telephone Encounter (Signed)
Spoke with sister Friendly yesterday regarding this. She can call rosa but yes pt is off of blood thinner will take aspirin instead please let her know

## 2021-02-01 NOTE — Telephone Encounter (Signed)
LVM for Robin [name] to call office pt has completed blood thinner for blood clot treatment and its not longer medically necessary pt is taking aspirin and per dr patel that was all she needed to take regarding blood thinner

## 2021-02-04 ENCOUNTER — Inpatient Hospital Stay (HOSPITAL_COMMUNITY): Payer: Medicare PPO

## 2021-02-05 ENCOUNTER — Other Ambulatory Visit: Payer: Self-pay

## 2021-02-05 ENCOUNTER — Telehealth: Payer: Self-pay | Admitting: Internal Medicine

## 2021-02-05 ENCOUNTER — Other Ambulatory Visit: Payer: Self-pay | Admitting: Internal Medicine

## 2021-02-05 ENCOUNTER — Inpatient Hospital Stay (HOSPITAL_COMMUNITY): Payer: Medicare PPO | Attending: Hematology

## 2021-02-05 DIAGNOSIS — N2889 Other specified disorders of kidney and ureter: Secondary | ICD-10-CM | POA: Insufficient documentation

## 2021-02-05 DIAGNOSIS — C50912 Malignant neoplasm of unspecified site of left female breast: Secondary | ICD-10-CM | POA: Insufficient documentation

## 2021-02-05 DIAGNOSIS — I779 Disorder of arteries and arterioles, unspecified: Secondary | ICD-10-CM

## 2021-02-05 DIAGNOSIS — I82622 Acute embolism and thrombosis of deep veins of left upper extremity: Secondary | ICD-10-CM | POA: Diagnosis not present

## 2021-02-05 DIAGNOSIS — Z170 Estrogen receptor positive status [ER+]: Secondary | ICD-10-CM | POA: Insufficient documentation

## 2021-02-05 DIAGNOSIS — M25519 Pain in unspecified shoulder: Secondary | ICD-10-CM | POA: Insufficient documentation

## 2021-02-05 DIAGNOSIS — C7951 Secondary malignant neoplasm of bone: Secondary | ICD-10-CM | POA: Diagnosis not present

## 2021-02-05 DIAGNOSIS — I2510 Atherosclerotic heart disease of native coronary artery without angina pectoris: Secondary | ICD-10-CM

## 2021-02-05 LAB — CBC WITH DIFFERENTIAL/PLATELET
Abs Immature Granulocytes: 0.03 10*3/uL (ref 0.00–0.07)
Basophils Absolute: 0 10*3/uL (ref 0.0–0.1)
Basophils Relative: 1 %
Eosinophils Absolute: 0.2 10*3/uL (ref 0.0–0.5)
Eosinophils Relative: 4 %
HCT: 37.7 % (ref 36.0–46.0)
Hemoglobin: 11.6 g/dL — ABNORMAL LOW (ref 12.0–15.0)
Immature Granulocytes: 1 %
Lymphocytes Relative: 17 %
Lymphs Abs: 1 10*3/uL (ref 0.7–4.0)
MCH: 25.1 pg — ABNORMAL LOW (ref 26.0–34.0)
MCHC: 30.8 g/dL (ref 30.0–36.0)
MCV: 81.4 fL (ref 80.0–100.0)
Monocytes Absolute: 0.6 10*3/uL (ref 0.1–1.0)
Monocytes Relative: 9 %
Neutro Abs: 4.4 10*3/uL (ref 1.7–7.7)
Neutrophils Relative %: 68 %
Platelets: 157 10*3/uL (ref 150–400)
RBC: 4.63 MIL/uL (ref 3.87–5.11)
RDW: 16.3 % — ABNORMAL HIGH (ref 11.5–15.5)
WBC: 6.2 10*3/uL (ref 4.0–10.5)
nRBC: 0 % (ref 0.0–0.2)

## 2021-02-05 LAB — COMPREHENSIVE METABOLIC PANEL
ALT: 10 U/L (ref 0–44)
AST: 18 U/L (ref 15–41)
Albumin: 4 g/dL (ref 3.5–5.0)
Alkaline Phosphatase: 63 U/L (ref 38–126)
Anion gap: 10 (ref 5–15)
BUN: 22 mg/dL (ref 8–23)
CO2: 22 mmol/L (ref 22–32)
Calcium: 9.6 mg/dL (ref 8.9–10.3)
Chloride: 108 mmol/L (ref 98–111)
Creatinine, Ser: 0.98 mg/dL (ref 0.44–1.00)
GFR, Estimated: 58 mL/min — ABNORMAL LOW (ref >60)
Glucose, Bld: 103 mg/dL — ABNORMAL HIGH (ref 70–99)
Potassium: 4 mmol/L (ref 3.5–5.1)
Sodium: 140 mmol/L (ref 135–145)
Total Bilirubin: 0.7 mg/dL (ref 0.3–1.2)
Total Protein: 7.7 g/dL (ref 6.5–8.1)

## 2021-02-05 LAB — VITAMIN D 25 HYDROXY (VIT D DEFICIENCY, FRACTURES): Vit D, 25-Hydroxy: 63.71 ng/mL (ref 30–100)

## 2021-02-05 LAB — MAGNESIUM: Magnesium: 2 mg/dL (ref 1.7–2.4)

## 2021-02-05 MED ORDER — CLOPIDOGREL BISULFATE 75 MG PO TABS: 75.0000 mg | ORAL_TABLET | Freq: Every day | ORAL | 3 refills | Status: DC

## 2021-02-05 NOTE — Telephone Encounter (Signed)
Patient daughter [name] came by the office to speak to the clinic nurse, nurse in with other patients. [name] asked if nurse would contact her at [phone], sister did say she would speak to her sister [name] but still would like the clinic nurse give her a call and explain more about the treatment of blood thinner. Needs some clarification.

## 2021-02-05 NOTE — Telephone Encounter (Signed)
Spoke with [name]

## 2021-02-05 NOTE — Telephone Encounter (Signed)
Return call

## 2021-02-05 NOTE — Telephone Encounter (Signed)
LVM for Robin [name] to call the office

## 2021-02-05 NOTE — Telephone Encounter (Signed)
Spoke with [name] pt was taking plavix back when she had stroke  this was not due to blood clot she was taking plavix even before she had blood clot the patient does not want to come off the medication as she was told she would be on this for the rest of her life so the family and the patient was wanting clarification as patient is not happy to be coming off of this but the patient has been on plavix for a long time please advise

## 2021-02-06 ENCOUNTER — Telehealth: Payer: Self-pay | Admitting: Internal Medicine

## 2021-02-06 LAB — CANCER ANTIGEN 15-3: CA 15-3: 42 U/mL — ABNORMAL HIGH (ref 0.0–25.0)

## 2021-02-06 NOTE — Telephone Encounter (Signed)
Belva Crome called on pt behalf  States that pt was on Plavix and was taken off by hospital   Would like  to be back on eliquis    And also wants to cancel order for plavix at pharm pt will not be picking up

## 2021-02-08 ENCOUNTER — Telehealth: Payer: Self-pay | Admitting: Internal Medicine

## 2021-02-08 NOTE — Telephone Encounter (Signed)
Pts daughter is calling (LVM) that pt is having UTI symptoms, and would like something called in --please call the pt [phone]

## 2021-02-11 ENCOUNTER — Ambulatory Visit (HOSPITAL_COMMUNITY)
Admission: RE | Admit: 2021-02-11 | Discharge: 2021-02-11 | Disposition: A | Discharge status: Home / Self Care | Payer: Medicare PPO | Source: Ambulatory Visit | Attending: Hematology | Admitting: Hematology

## 2021-02-11 ENCOUNTER — Other Ambulatory Visit: Payer: Self-pay

## 2021-02-11 DIAGNOSIS — C7951 Secondary malignant neoplasm of bone: Secondary | ICD-10-CM | POA: Insufficient documentation

## 2021-02-11 DIAGNOSIS — R634 Abnormal weight loss: Secondary | ICD-10-CM | POA: Diagnosis not present

## 2021-02-11 DIAGNOSIS — C50919 Malignant neoplasm of unspecified site of unspecified female breast: Secondary | ICD-10-CM | POA: Diagnosis not present

## 2021-02-11 DIAGNOSIS — C50912 Malignant neoplasm of unspecified site of left female breast: Secondary | ICD-10-CM | POA: Insufficient documentation

## 2021-02-11 DIAGNOSIS — I2510 Atherosclerotic heart disease of native coronary artery without angina pectoris: Secondary | ICD-10-CM | POA: Diagnosis not present

## 2021-02-11 DIAGNOSIS — I700 Atherosclerosis of aorta: Secondary | ICD-10-CM | POA: Diagnosis not present

## 2021-02-11 MED ORDER — IOHEXOL 300 MG/ML  SOLN
100.0000 mL | Freq: Once | INTRAMUSCULAR | Status: AC | PRN
  Administered 2021-02-11: 100 mL via INTRAVENOUS

## 2021-02-14 ENCOUNTER — Other Ambulatory Visit: Payer: Self-pay

## 2021-02-14 ENCOUNTER — Inpatient Hospital Stay (HOSPITAL_COMMUNITY): Payer: Medicare PPO | Admitting: Hematology

## 2021-02-14 VITALS — BP 189/72 | HR 59 | Temp 96.0°F | Resp 18 | Wt 215.2 lb

## 2021-02-14 DIAGNOSIS — I82622 Acute embolism and thrombosis of deep veins of left upper extremity: Secondary | ICD-10-CM | POA: Diagnosis not present

## 2021-02-14 DIAGNOSIS — N2889 Other specified disorders of kidney and ureter: Secondary | ICD-10-CM | POA: Diagnosis not present

## 2021-02-14 DIAGNOSIS — Z170 Estrogen receptor positive status [ER+]: Secondary | ICD-10-CM | POA: Diagnosis not present

## 2021-02-14 DIAGNOSIS — C7951 Secondary malignant neoplasm of bone: Secondary | ICD-10-CM | POA: Diagnosis not present

## 2021-02-14 DIAGNOSIS — C50912 Malignant neoplasm of unspecified site of left female breast: Secondary | ICD-10-CM

## 2021-02-14 DIAGNOSIS — M25519 Pain in unspecified shoulder: Secondary | ICD-10-CM | POA: Diagnosis not present

## 2021-02-14 MED ORDER — APIXABAN 5 MG PO TABS: 5.0000 mg | ORAL_TABLET | Freq: Two times a day (BID) | ORAL | 3 refills | Status: DC

## 2021-02-14 NOTE — Patient Instructions (Addendum)
[facility] Discharge Instructions   You were seen and examined today by Dr. Delton Coombes.  He reviewed the results of your lab work and CT scan which are normal/stable.  Continue anastrozole 1 mg daily.  Resume taking Eliquis as prescribed.  You should not stop taking this due to having Stage IV cancer, which increases your risk of developing blood clots. Continue aspirin 81 mg daily.  STOP Plavix.   Return as scheduled for lab work and office visit.      Thank you for choosing [facility] to provide your oncology and hematology care.  To afford each patient quality time with our provider, please arrive at least 15 minutes before your scheduled appointment time.   If you have a lab appointment with the [facility] please come in thru the Main Entrance and check in at the main information desk.  You need to re-schedule your appointment should you arrive 10 or more minutes late.  We strive to give you quality time with our providers, and arriving late affects you and other patients whose appointments are after yours.  Also, if you no show three or more times for appointments you may be dismissed from the clinic at the providers discretion.     Again, thank you for choosing [facility]  Our hope is that these requests will decrease the amount of time that you wait before being seen by our physicians.       _____________________________________________________________  Should you have questions after your visit to [facility], please contact our office at [phone] and follow the prompts.  Our office hours are 8:00 a.m. and 4:30 p.m. Monday - Friday.  Please note that voicemails left after 4:00 p.m. may not be returned until the following business day.  We are closed weekends and major holidays.  You do have access to a nurse 24-7, just call the main number to the clinic [phone] and do  not press any options, hold on the line and a nurse will answer the phone.    For prescription refill requests, have your pharmacy contact our office and allow 72 hours.    Due to Covid, you will need to wear a mask upon entering the hospital. If you do not have a mask, a mask will be given to you at the Main Entrance upon arrival. For doctor visits, patients may have 1 support person age 17 or older with them. For treatment visits, patients can not have anyone with them due to social distancing guidelines and our immunocompromised population.

## 2021-02-14 NOTE — Progress Notes (Signed)
[facility] [address] [place] [address]   Patient Care Team: Anabel Halon, MD as PCP - General (Internal Medicine) Jonelle Sidle, MD as PCP - Cardiology (Cardiology) Therese Sarah, RN as Oncology Nurse Navigator (Oncology)  SUMMARY OF ONCOLOGIC HISTORY: Oncology History   No history exists.    CHIEF COMPLIANT: Follow-up of left breast cancer with metastases to bones   INTERVAL HISTORY: [name] is a 81 y.o. female here today for follow up of her left breast cancer with metastases to bones. Her last visit was on 11/14/2020.   Today she reports feeling good. She is eating good. She denies blurred vision, headaches, hot flashes, and n/v/d. She is taking anastrozole and tolerating it well.   REVIEW OF SYSTEMS:   Review of Systems  Constitutional:  Negative for appetite change and fatigue.  Eyes:  Negative for eye problems.  Gastrointestinal:  Negative for diarrhea, nausea and vomiting.  Endocrine: Negative for hot flashes.  Neurological:  Negative for headaches.  All other systems reviewed and are negative.  I have reviewed the past medical history, past surgical history, social history and family history with the patient and they are unchanged from previous note.   ALLERGIES:   is allergic to codeine and tramadol.   MEDICATIONS:  Current Outpatient Medications  Medication Sig Dispense Refill   acetaminophen (TYLENOL) 325 MG tablet Take 2 tablets (650 mg total) by mouth every 6 (six) hours as needed for mild pain, fever or headache (or Fever >/= 101). 12 tablet 0   amLODipine (NORVASC) 10 MG tablet Take 1 tablet (10 mg total) by mouth daily. 90 tablet 3   anastrozole (ARIMIDEX) 1 MG tablet TAKE 1 TABLET BY MOUTH EVERY DAY 30 tablet 3   aspirin 81 MG tablet Take 1 tablet (81 mg total) by mouth daily with breakfast. 30 tablet 3   carvedilol (COREG) 12.5 MG tablet Take 1 tablet (12.5 mg total) by mouth 2 (two) times daily with a  meal. 90 tablet 3   Cholecalciferol (VITAMIN D3) 125 MCG (5000 UT) CAPS TAKE 1 CAPSULE BY MOUTH ONCE DAILY (Patient taking differently: Take 1 capsule by mouth daily.) 90 capsule 1   clopidogrel (PLAVIX) 75 MG tablet Take 1 tablet (75 mg total) by mouth daily. 90 tablet 3   dicyclomine (BENTYL) 10 MG capsule Take 1 capsule (10 mg total) by mouth 3 (three) times daily as needed for spasms. 20 capsule 0   ferrous sulfate 325 (65 FE) MG tablet Take 1 tablet (325 mg total) by mouth daily with breakfast. 30 tablet 0   furosemide (LASIX) 20 MG tablet TAKE 1 TABLET BY MOUTH EVERY DAY AS NEEDED AS DIRECTED 30 tablet 1   gabapentin (NEURONTIN) 100 MG capsule Take 1 capsule (100 mg total) by mouth at bedtime. 90 capsule 3   HYDROcodone-acetaminophen (NORCO/VICODIN) 5-325 MG tablet Take 1 tablet by mouth every 12 (twelve) hours as needed for moderate pain. 20 tablet 0   isosorbide mononitrate (IMDUR) 30 MG 24 hr tablet Take 1 tablet (30 mg total) by mouth daily. 90 tablet 2   KLOR-CON M10 10 MEQ tablet TAKE 1 TABLET BY MOUTH EVERY DAY 90 tablet 1   Omega-3 Fatty Acids 300 MG CAPS Take 1 capsule (300 mg total) by mouth 2 (two) times daily.     pantoprazole (PROTONIX) 40 MG tablet Take 1 tablet (40 mg total) by mouth daily. 30 tablet 3   rosuvastatin (CRESTOR) 40 MG  tablet Take 1 tablet (40 mg total) by mouth daily. 90 tablet 2   senna-docusate (SENOKOT-S) 8.6-50 MG tablet Take 2 tablets by mouth at bedtime. 60 tablet 1   SYNTHROID 125 MCG tablet TAKE 1 TABLET BY MOUTH DAILY BEFORE BREAKFAST. 30 tablet 5   nitroGLYCERIN (NITROSTAT) 0.4 MG SL tablet Place 1 tablet (0.4 mg total) under the tongue every 5 (five) minutes x 3 doses as needed. (Patient not taking: Reported on 02/14/2021) 25 tablet 3   No current facility-administered medications for this visit.     PHYSICAL EXAMINATION: Performance status (ECOG): 2 - Symptomatic, <50% confined to bed  Vitals:   02/14/21 1201  BP: (!) 189/72  Pulse: (!) 59   Resp: 18  Temp: (!) 96 F (35.6 C)  SpO2: 100%   Wt Readings from Last 3 Encounters:  02/14/21 215 lb 3.2 oz (97.6 kg)  12/11/20 213 lb 6.4 oz (96.8 kg)  11/14/20 216 lb (98 kg)   Physical Exam Vitals reviewed.  Constitutional:      Appearance: Normal appearance. She is obese.     Comments: In wheelchair  Cardiovascular:     Rate and Rhythm: Normal rate and regular rhythm.     Pulses: Normal pulses.     Heart sounds: Murmur (stable) heard.  Pulmonary:     Effort: Pulmonary effort is normal.     Breath sounds: Normal breath sounds.  Chest:  Breasts:    Right: Absent. No swelling, bleeding, mass, skin change (mastectomy site WNL) or tenderness.     Left: Absent. No swelling, bleeding, mass, skin change (mastectomy site WNL) or tenderness.  Musculoskeletal:     Right lower leg: 1+ Edema present.     Left lower leg: 1+ Edema present.  Lymphadenopathy:     Upper Body:     Right upper body: No supraclavicular, axillary or pectoral adenopathy.     Left upper body: No supraclavicular, axillary or pectoral adenopathy.  Neurological:     General: No focal deficit present.     Mental Status: She is alert and oriented to person, place, and time.  Psychiatric:        Mood and Affect: Mood normal.        Behavior: Behavior normal.    Breast Exam Chaperone: Thana Ates     LABORATORY DATA:  I have reviewed the data as listed CMP Latest Ref Rng & Units 02/05/2021 12/11/2020 11/14/2020  Glucose 70 - 99 mg/dL 103(H) 103(H) 103(H)  BUN 8 - 23 mg/dL $Remove'22 21 22  'aVDkcAE$ Creatinine 0.44 - 1.00 mg/dL 0.98 1.11(H) 1.16(H)  Sodium 135 - 145 mmol/L 140 139 137  Potassium 3.5 - 5.1 mmol/L 4.0 4.0 3.8  Chloride 98 - 111 mmol/L 108 108 107  CO2 22 - 32 mmol/L $RemoveB'22 23 23  'QSOuzxjl$ Calcium 8.9 - 10.3 mg/dL 9.6 9.5 9.6  Total Protein 6.5 - 8.1 g/dL 7.7 7.6 7.5  Total Bilirubin 0.3 - 1.2 mg/dL 0.7 0.6 0.6  Alkaline Phos 38 - 126 U/L 63 62 53  AST 15 - 41 U/L $Remo'18 19 16  'oAvip$ ALT 0 - 44 U/L $Remo'10 14 12   'JEXsu$ Lab Results   Component Value Date   CAN153 42.0 (H) 02/05/2021   CAN153 36.2 (H) 11/14/2020   CAN153 35.9 (H) 09/26/2020   Lab Results  Component Value Date   WBC 6.2 02/05/2021   HGB 11.6 (L) 02/05/2021   HCT 37.7 02/05/2021   MCV 81.4 02/05/2021   PLT 157 02/05/2021   NEUTROABS  4.4 02/05/2021    ASSESSMENT:  1.  Recurrent breast carcinoma with metastases to bones: -History of right breast cancer in 1987, status post right mastectomy requiring no adjuvant chemo or XRT.  Was not on tamoxifen. -History of left breast cancer in 2007, status post maste[facility] by [facility] Carlis Abbott at [facility], placed on tamoxifen, discontinued around 2015. -Recent upper back pain, weight loss of 20 pounds in the last 3 to 6 months. -CT angio on 04/04/2020 showed irregular mass in the left chest wall concerning for recurrent breast cancer measuring 4.5 x 3.1 cm. -Also showed lytic lesions in T1 and T3 vertebral bodies as well as manubrium. -Left chest wall mass biopsy on 04/05/2020 consistent with adenocarcinoma compatible with recurrent breast carcinoma.  ER 95% positive, PR 40% positive, HER2 2+, negative by FISH. - PET scan on 05/15/2020 shows hypermetabolic mass in the left anterior chest wall, mild hypermetabolic thoracic lymphadenopathy, diffuse bone metastasis throughout the spine, sternum, both shoulders, pelvis, proximal right femur which are both lytic and sclerotic. - Anastrozole started around 05/21/2020.  She never started palbociclib even though it was recommended at that time.   2.  Left upper extremity DVT: -Left upper extremity Doppler on 04/04/2020 + for DVT involving left internal jugular vein, left subclavian vein, left axillary vein, left brachial and left basilic vein.   3.  Family history: - No major family history of malignancies other than her brother with throat cancer.   PLAN:  1.  Metastatic breast cancer to the bones, ER/PR positive: - She is tolerating anastrozole very well.  Very minor  hot flashes. - Lives at home with her granddaughter.  She is able to do all her ADLs and IADLs. - We reviewed CT CAP from 02/11/2021 which showed unchanged soft tissue mass in the left chest wall mastectomy site.  Multiple small lung nodules stable.  Stable bone metastasis. - Labs on 02/05/2021 showed CA 15-3 42 and slightly elevated.  CBC was grossly normal.  LFTs are normal. - Recommend continue anastrozole.  Will consider adding CDK 4/6 inhibitor upon progression. - RTC 3 months for follow-up with repeat tumor markers and labs.   2.  Left upper extremity DVT: - Eliquis was reportedly stopped and Plavix was started. - She has been on Plavix since 2000 when she had CVA.  She is also on aspirin 81 mg daily. - She has a left upper extremity DVT in the setting of stage IV breast cancer.  Hence I would strongly recommend indefinite anticoagulation as the thrombogenic risk is high in active malignancy. - Hence she was recommended to restart Eliquis and hold off on Plavix.  She will continue aspirin 81 mg daily.   3.  Right chest wall and scapular pain: - This has improved since the start of anastrozole.  She does not have any pain.   4.  Bone metastasis: - Calcium is 9.6. - Continue monthly denosumab.  Continue calcium and vitamin D supplements.  5.  Left kidney mass: - Current scan shows 2.1 x 1.7 cm exophytic soft tissue lesion in the lateral inferior pole of the left kidney which is stable compared to prior scan.  Continue to monitor.  Breast Cancer therapy associated bone loss: I have recommended calcium, Vitamin D and weight bearing exercises.  Orders placed this encounter:  No orders of the defined types were placed in this encounter.   The patient has a good understanding of the overall plan. She agrees with it. She will call with  any problems that may develop before the next visit here.  Derek Jack, MD [place] [phone]   I, Thana Ates, am acting  as a scribe for Dr. Derek Jack.  I, Derek Jack MD, have reviewed the above documentation for accuracy and completeness, and I agree with the above.

## 2021-02-15 NOTE — Telephone Encounter (Signed)
Called pt she is still having symptoms, however she cant come by to leave a urine, until someone can drive her. She will call back and let us know.

## 2021-02-21 DIAGNOSIS — R300 Dysuria: Secondary | ICD-10-CM | POA: Diagnosis not present

## 2021-02-21 DIAGNOSIS — N390 Urinary tract infection, site not specified: Secondary | ICD-10-CM | POA: Diagnosis not present

## 2021-03-05 ENCOUNTER — Other Ambulatory Visit: Payer: Self-pay | Admitting: *Deleted

## 2021-03-05 ENCOUNTER — Telehealth: Payer: Self-pay

## 2021-03-05 MED ORDER — ROSUVASTATIN CALCIUM 40 MG PO TABS: 40.0000 mg | ORAL_TABLET | Freq: Every day | ORAL | 2 refills | Status: DC

## 2021-03-05 NOTE — Telephone Encounter (Signed)
Patient daughter called need med refill  rosuvastatin (CRESTOR) 40 MG tablet  Pharmacy  [facility], [place] [address]  [address] [place], [place] [address]  Phone:  [phone]  Fax:  [phone]

## 2021-03-05 NOTE — Telephone Encounter (Signed)
Medication sent to pharmacy  

## 2021-04-02 ENCOUNTER — Encounter (HOSPITAL_COMMUNITY): Payer: Self-pay | Admitting: Emergency Medicine

## 2021-04-02 ENCOUNTER — Emergency Department (HOSPITAL_COMMUNITY): Payer: Medicare PPO

## 2021-04-02 ENCOUNTER — Emergency Department (HOSPITAL_COMMUNITY)
Admission: EM | Admit: 2021-04-02 | Discharge: 2021-04-02 | Disposition: A | Discharge status: Home / Self Care | Payer: Medicare PPO | Attending: Emergency Medicine | Admitting: Emergency Medicine

## 2021-04-02 ENCOUNTER — Other Ambulatory Visit: Payer: Self-pay

## 2021-04-02 DIAGNOSIS — M19041 Primary osteoarthritis, right hand: Secondary | ICD-10-CM | POA: Diagnosis not present

## 2021-04-02 DIAGNOSIS — Z7901 Long term (current) use of anticoagulants: Secondary | ICD-10-CM | POA: Diagnosis not present

## 2021-04-02 DIAGNOSIS — M1180 Other specified crystal arthropathies, unspecified site: Secondary | ICD-10-CM | POA: Diagnosis not present

## 2021-04-02 DIAGNOSIS — M1120 Other chondrocalcinosis, unspecified site: Secondary | ICD-10-CM | POA: Insufficient documentation

## 2021-04-02 DIAGNOSIS — R609 Edema, unspecified: Secondary | ICD-10-CM

## 2021-04-02 DIAGNOSIS — M7989 Other specified soft tissue disorders: Secondary | ICD-10-CM | POA: Diagnosis not present

## 2021-04-02 MED ORDER — NAPROXEN 250 MG PO TABS: 250.0000 mg | ORAL_TABLET | Freq: Two times a day (BID) | ORAL | 0 refills | Status: DC

## 2021-04-02 MED ORDER — ACETAMINOPHEN 325 MG PO TABS
650.0000 mg | ORAL_TABLET | Freq: Once | ORAL | Status: AC
Start: 2021-04-02 — End: 2021-04-02
  Administered 2021-04-02: 650 mg via ORAL
  Filled 2021-04-02: qty 2

## 2021-04-02 MED ORDER — NAPROXEN 250 MG PO TABS
250.0000 mg | ORAL_TABLET | Freq: Once | ORAL | Status: AC
  Administered 2021-04-02: 250 mg via ORAL
  Filled 2021-04-02: qty 1

## 2021-04-02 NOTE — ED Triage Notes (Signed)
Pt arrives from home with c/o worsening right hand pain from arthritis that got progressively worse yesterday. She reports normally getting cortisone shots, last one approx 1 year ago. States pain kept her up all night long. Able move all fingers but noted swelling to knuckles on right side.  ?

## 2021-04-02 NOTE — ED Provider Notes (Signed)
?[facility] ?Provider Note ? ? ?CSN: [id number] ?Arrival date & time: 04/02/21  0813 ? ?  ? ?History ? ?Chief Complaint  ?Patient presents with  ? Hand Pain  ? ? ?Robin [name] is a 81 y.o. female. ? ?HPI ? ?  ? ?81 year old female comes in with chief complaint of hand pain.  Patient indicates that she has chronic pain to her hand and arthritis, however over the last weekend her symptoms have progressively worsened.  She feels quite stiff and is having difficulty making a fist.  She is also having discomfort over her wrist. ? ?She does take some cortisone shots over her hand. ? ?Denies any fevers, chills. ? ?Home Medications ?Prior to Admission medications   ?Medication Sig Start Date End Date Taking? Authorizing Provider  ?naproxen (NAPROSYN) 250 MG tablet Take 1 tablet (250 mg total) by mouth 2 (two) times daily with a meal. 04/02/21  Yes Varney Biles, MD  ?acetaminophen (TYLENOL) 325 MG tablet Take 2 tablets (650 mg total) by mouth every 6 (six) hours as needed for mild pain, fever or headache (or Fever >/= 101). 04/06/20   Roxan Hockey, MD  ?amLODipine (NORVASC) 10 MG tablet Take 1 tablet (10 mg total) by mouth daily. 04/06/20   Roxan Hockey, MD  ?anastrozole (ARIMIDEX) 1 MG tablet TAKE 1 TABLET BY MOUTH EVERY DAY 12/27/20   Derek Jack, MD  ?apixaban (ELIQUIS) 5 MG TABS tablet Take 1 tablet (5 mg total) by mouth 2 (two) times daily. 02/14/21   Derek Jack, MD  ?aspirin 81 MG tablet Take 1 tablet (81 mg total) by mouth daily with breakfast. 04/06/20   Roxan Hockey, MD  ?carvedilol (COREG) 12.5 MG tablet Take 1 tablet (12.5 mg total) by mouth 2 (two) times daily with a meal. 11/20/20   [name], MD  ?Cholecalciferol (VITAMIN D3) 125 MCG (5000 UT) CAPS TAKE 1 CAPSULE BY MOUTH ONCE DAILY ?Patient taking differently: Take 1 capsule by mouth daily. 01/14/18   Cassandria Anger, MD  ?clopidogrel (PLAVIX) 75 MG tablet Take 1 tablet (75 mg total) by mouth  daily. 02/05/21   [name], MD  ?dicyclomine (BENTYL) 10 MG capsule Take 1 capsule (10 mg total) by mouth 3 (three) times daily as needed for spasms. 11/27/20   [name], MD  ?ferrous sulfate 325 (65 FE) MG tablet Take 1 tablet (325 mg total) by mouth daily with breakfast. 05/06/20   Cristal Deer, MD  ?furosemide (LASIX) 20 MG tablet TAKE 1 TABLET BY MOUTH EVERY DAY AS NEEDED AS DIRECTED 12/17/20   [name], MD  ?gabapentin (NEURONTIN) 100 MG capsule Take 1 capsule (100 mg total) by mouth at bedtime. 01/30/21   [name], MD  ?HYDROcodone-acetaminophen (NORCO/VICODIN) 5-325 MG tablet Take 1 tablet by mouth every 12 (twelve) hours as needed for moderate pain. 04/12/20   Derek Jack, MD  ?isosorbide mononitrate (IMDUR) 30 MG 24 hr tablet Take 1 tablet (30 mg total) by mouth daily. 04/07/20   Roxan Hockey, MD  ?KLOR-CON M10 10 MEQ tablet TAKE 1 TABLET BY MOUTH EVERY DAY 01/15/21   [name], MD  ?nitroGLYCERIN (NITROSTAT) 0.4 MG SL tablet Place 1 tablet (0.4 mg total) under the tongue every 5 (five) minutes x 3 doses as needed. ?Patient not taking: Reported on 02/14/2021 01/11/14   Satira Sark, MD  ?Omega-3 Fatty Acids 300 MG CAPS Take 1 capsule (300 mg total) by mouth 2 (two) times daily. 04/24/11  Donney Dice, PA-C  ?pantoprazole (PROTONIX) 40 MG tablet Take 1 tablet (40 mg total) by mouth daily. 04/07/20   Roxan Hockey, MD  ?rosuvastatin (CRESTOR) 40 MG tablet Take 1 tablet (40 mg total) by mouth daily. 03/05/21   [name], MD  ?senna-docusate (SENOKOT-S) 8.6-50 MG tablet Take 2 tablets by mouth at bedtime. 04/06/20 04/06/21  Roxan Hockey, MD  ?SYNTHROID 125 MCG tablet TAKE 1 TABLET BY MOUTH DAILY BEFORE BREAKFAST. 01/16/21   Brita Romp, NP  ?   ? ?Allergies    ?Codeine and Tramadol   ? ?Review of Systems   ?Review of Systems ? ?Physical Exam ?Updated Vital Signs ?BP (!) 177/80 (BP Location: Right Wrist) Comment: states she has not taken bp meds  yet this morning  Pulse 61   Temp 97.9 ?F (36.6 ?C) (Oral)   Resp 18   Ht '5\' 6"'$  (1.676 m)   Wt 97.1 kg   SpO2 100%   BMI 34.54 kg/m?  ?Physical Exam ?Vitals and nursing note reviewed.  ?Constitutional:   ?   Appearance: She is well-developed.  ?HENT:  ?   Head: Atraumatic.  ?Cardiovascular:  ?   Rate and Rhythm: Normal rate.  ?Pulmonary:  ?   Effort: Pulmonary effort is normal.  ?Musculoskeletal:  ?   Cervical back: Normal range of motion and neck supple.  ?   Comments: Patient has edema surrounding the right wrist on the dorsal aspect, more so proximally.  Wrist range of motion however is normal both active and passive.  She has mild edema over the dorsum of the hand compared to the contralateral side without any erythema or warmth to touch.  Patient able to flex and extend over the MCP PIP joint but unable to make a complete fist.  She is able to give okay sign, has a normal pincer grip, and able to give thumbs up.  No numbness or tingling.  ?Skin: ?   General: Skin is warm and dry.  ?Neurological:  ?   Mental Status: She is alert and oriented to person, place, and time.  ? ? ?ED Results / Procedures / Treatments   ?Labs ?(all labs ordered are listed, but only abnormal results are displayed) ?Labs Reviewed - No data to display ? ?EKG ?None ? ?Radiology ?DG Hand Complete Right ? ?Result Date: 04/02/2021 ?CLINICAL DATA:  Swelling. EXAM: RIGHT HAND - COMPLETE 3 VIEW COMPARISON:  None FINDINGS: Advanced degenerative changes are present. Changes noted most prominently at the DIP joints of the index and middle fingers with large osteophytes. Marked changes are present at the first [facility] joint with associated subluxation. No acute or healing fractures are present. Chondrocalcinosis present in the proximal wrist. IMPRESSION: 1. Advanced osteoarthritic degenerative changes as described. Findings are most prominent in the DIP joints of the index and middle fingers and first CMC joint. 2. No acute or healing fractures.  3. Chondrocalcinosis of the proximal wrist. Question CPPD. Electronically Signed   By: San Morelle M.D.   On: 04/02/2021 09:14   ? ?Procedures ?Procedures  ? ? ?Medications Ordered in ED ?Medications  ?naproxen (NAPROSYN) tablet 250 mg (250 mg Oral Given 04/02/21 0944)  ?acetaminophen (TYLENOL) tablet 650 mg (650 mg Oral Given 04/02/21 0944)  ? ? ?ED Course/ Medical Decision Making/ A&P ?  ?                        ?Medical Decision Making ?Amount and/or Complexity of Data  Reviewed ?Radiology: ordered. ? ?Risk ?OTC drugs. ?Prescription drug management. ? ? ?81 year old female comes in with chief complaint of hand pain. ? ?She has history of osteoarthritis of her hand and receives cortisone shots to her hand.  No recent injection. ? ?She is noted to have swollen hand with compromise in her range of motion of the hand primarily.  She is able to passively range her wrist and digits. ? ?Suspicion is that most likely this is arthritis related.  X-ray ordered to see if there is any deposits of uric acid or calcium. ? ?Reassessment: ?Hand x-ray visualized.  No evidence of free air. ?Radiologist does appreciate some crystal deposits, questioning if patient has CPPD.  Clinically does make sense given the edema we are noticing.  Treatment will still be the same.  Advised outpatient PCP follow-up so that she can get hand referral as needed.  Rice treatment advised for now ? ?Final Clinical Impression(s) / ED Diagnoses ?Final diagnoses:  ?Calcium pyrophosphate deposition disease (CPPD)  ?Primary osteoarthritis of right hand  ? ? ?Rx / DC Orders ?ED Discharge Orders   ? ?      Ordered  ?  naproxen (NAPROSYN) 250 MG tablet  2 times daily with meals       ? 04/02/21 0935  ? ?  ?  ? ?  ? ? ?  ?Varney Biles, MD ?04/02/21 1020 ? ?

## 2021-04-02 NOTE — Discharge Instructions (Signed)
X-ray of your hand reveals possibility of calcium deposition to your joint over the wrist, which likely explains the wrist stiffness and discomfort.  There is significant arthritis of the hand as well. ? ?Please follow-up with your primary care doctor in 7 to 14 days.  Until then, apply ice 4 times a day for 10 to 20 minutes each.  Also take anti-inflammatory medication that is prescribed for the next 3 days only. ? ?If not better, then your primary care doctor might have to send you to a hand doctor. ?

## 2021-04-15 ENCOUNTER — Other Ambulatory Visit: Payer: Self-pay

## 2021-04-15 ENCOUNTER — Encounter: Payer: Self-pay | Admitting: Orthopedic Surgery

## 2021-04-15 ENCOUNTER — Ambulatory Visit: Payer: Medicare PPO | Admitting: Orthopedic Surgery

## 2021-04-15 VITALS — BP 137/90 | HR 68 | Ht 66.0 in | Wt 216.0 lb

## 2021-04-15 DIAGNOSIS — M19039 Primary osteoarthritis, unspecified wrist: Secondary | ICD-10-CM

## 2021-04-15 MED ORDER — METHYLPREDNISOLONE 4 MG PO TABS
4.0000 mg | ORAL_TABLET | Freq: Every day | ORAL | 0 refills | Status: AC
Start: 2021-04-15 — End: 2021-04-29

## 2021-04-15 NOTE — Progress Notes (Signed)
Chief Complaint  ?Patient presents with  ? Hand Pain  ?  RT hand pain ?Painful x 1 week. Pt seen in the ED  ? ? ?HPI: 81 year old female recently seen in the emergency room March 14 complaining of acute pain swelling right wrist x-ray showed arthritis of the wrist and thumb with possible CPPD disease in the wrist area.  She is on naproxen Eliquis and wearing a wrist brace ? ? ? ?Past Medical History:  ?Diagnosis Date  ? Breast cancer ([place])   ? Tamoxifen started 2007  ? CAD (coronary artery disease)   ? DES second diagonal 2/12  ? Cardiomyopathy   ? Probable Takotsubo, LVEF 30-35% 2/12  ? Carotid artery disease ([place])   ? Essential hypertension   ? Hepatic steatosis   ? Hyperlipidemia   ? Hypothyroidism   ? NSTEMI (non-ST elevated myocardial infarction) ([place])   ? 2/12  ? Pancreas divisum   ? Stroke [facility])   ? (2000) residual left-sided weakness  ? Type 2 diabetes mellitus ([place])   ? reports not currently diabetic  ? ? ?BP 137/90   Pulse 68   Ht '5\' 6"'$  (1.676 m)   Wt 216 lb (98 kg)   BMI 34.86 kg/m?  ? ? ?General appearance: Well-developed well-nourished no gross deformities ? ?Cardiovascular normal pulse and perfusion normal color without edema ? ?Neurologically no sensation loss or deficits or pathologic reflexes ? ?Psychological: Awake alert and oriented x3 mood and affect normal ? ?Skin no lacerations or ulcerations no nodularity no palpable masses, no erythema or nodularity ? ?Musculoskeletal: Right wrist tenderness and swelling is noted around the wrist joint but is not warm she also has pain at the base of the thumb decreased range of motion flexion extension causes mild discomfort but rotation of the forearm causes more pain in the wrist joint ? ?Imaging outside imaging.  I have read the x-ray.  I see that she has basal joint arthritis grade 4 she has radial/scaphoid arthritis grade 2/3 ? ? ? ?A/P ? ?Note she also complained of possible tenosynovitis of the ring finger with some A1 pulley and palmar pain ? ?At  her age of 32 I do not think she would be a candidate for surgery so Georgina Peer take her off the naproxen because she is on the Eliquis I will put her on Medrol for 2 weeks and then brace for 6 weeks and then follow-up to see if any of this made any difference. ? ?A visit to OT may help as well ? ?Meds ordered this encounter  ?Medications  ? methylPREDNISolone (MEDROL) 4 MG tablet  ?  Sig: Take 1 tablet (4 mg total) by mouth daily for 14 days.  ?  Dispense:  14 tablet  ?  Refill:  0  ? ? ?

## 2021-04-15 NOTE — Patient Instructions (Signed)
Stop naproxen ? ?Start medrol and take for 2 weeks  ? ?wear brace 6 weeks  ? ? ?

## 2021-04-21 ENCOUNTER — Other Ambulatory Visit: Payer: Self-pay | Admitting: Nurse Practitioner

## 2021-04-21 ENCOUNTER — Encounter (HOSPITAL_COMMUNITY): Payer: Self-pay | Admitting: Hematology

## 2021-04-21 DIAGNOSIS — E039 Hypothyroidism, unspecified: Secondary | ICD-10-CM

## 2021-04-23 ENCOUNTER — Other Ambulatory Visit: Payer: Self-pay | Admitting: *Deleted

## 2021-04-23 ENCOUNTER — Telehealth: Payer: Self-pay

## 2021-04-23 MED ORDER — AMLODIPINE BESYLATE 10 MG PO TABS: 10.0000 mg | ORAL_TABLET | Freq: Every day | ORAL | 3 refills | Status: DC

## 2021-04-23 NOTE — Telephone Encounter (Signed)
Need med refill ? ?amLODipine (NORVASC) 10 MG tablet   ? ?Pharmacy ? ?[facility] [place]  ?[place], [place] [address]  ?Phone:  [phone]  Fax:  [phone]  ?DEA #:  --  ? ?

## 2021-04-23 NOTE — Telephone Encounter (Signed)
Medication sent to pharmacy  

## 2021-04-25 ENCOUNTER — Other Ambulatory Visit (HOSPITAL_COMMUNITY): Payer: Self-pay | Admitting: *Deleted

## 2021-04-25 ENCOUNTER — Other Ambulatory Visit (HOSPITAL_COMMUNITY): Payer: Self-pay | Admitting: Hematology

## 2021-04-25 NOTE — Telephone Encounter (Signed)
Refill completed on Anastrozole.  Per last ovn, patient is to continue therapy. ?

## 2021-05-02 ENCOUNTER — Ambulatory Visit: Payer: Medicare PPO | Admitting: Internal Medicine

## 2021-05-07 ENCOUNTER — Ambulatory Visit (INDEPENDENT_AMBULATORY_CARE_PROVIDER_SITE_OTHER): Payer: Medicare PPO | Admitting: Internal Medicine

## 2021-05-07 ENCOUNTER — Encounter: Payer: Self-pay | Admitting: Internal Medicine

## 2021-05-07 DIAGNOSIS — Z86718 Personal history of other venous thrombosis and embolism: Secondary | ICD-10-CM | POA: Insufficient documentation

## 2021-05-07 DIAGNOSIS — Z8639 Personal history of other endocrine, nutritional and metabolic disease: Secondary | ICD-10-CM | POA: Insufficient documentation

## 2021-05-07 DIAGNOSIS — J301 Allergic rhinitis due to pollen: Secondary | ICD-10-CM

## 2021-05-07 DIAGNOSIS — Z955 Presence of coronary angioplasty implant and graft: Secondary | ICD-10-CM | POA: Insufficient documentation

## 2021-05-07 MED ORDER — CETIRIZINE HCL 10 MG PO TABS: 10.0000 mg | ORAL_TABLET | Freq: Every day | ORAL | 0 refills | Status: DC

## 2021-05-07 MED ORDER — BENZONATATE 100 MG PO CAPS: 100.0000 mg | ORAL_CAPSULE | Freq: Two times a day (BID) | ORAL | 0 refills | Status: DC | PRN

## 2021-05-07 MED ORDER — FLUTICASONE PROPIONATE 50 MCG/ACT NA SUSP: 2.0000 | Freq: Every day | NASAL | 1 refills | Status: DC

## 2021-05-07 NOTE — Progress Notes (Signed)
?  ? ?Virtual Visit via Telephone Note  ? ?This visit type was conducted due to national recommendations for restrictions regarding the COVID-19 Pandemic (e.g. social distancing) in an effort to limit this patient's exposure and mitigate transmission in our community.  Due to her co-morbid illnesses, this patient is at least at moderate risk for complications without adequate follow up.  This format is felt to be most appropriate for this patient at this time.  The patient did not have access to video technology/had technical difficulties with video requiring transitioning to audio format only (telephone).  All issues noted in this document were discussed and addressed.  No physical exam could be performed with this format. ? ?Evaluation Performed:  Follow-up visit ? ?Date:  05/07/2021  ? ?ID:  Robin [name], DOB 03/11/1940, MRN [id number] ? ?Patient Location: Home ?Provider Location: Office/Clinic ? ?Participants: Patient and [name] ?Location of Patient: Home ?Location of Provider: Telehealth ?Consent was obtain for visit to be over via telehealth. ?I verified that I am speaking with the correct person using two identifiers. ? ?PCP:  [name], MD  ? ?Chief Complaint: Nasal congestion, cough and watery eyes ? ?History of Present Illness:   ? ?Robin [name] is a 81 y.o. female who has a televisit for complaint of nasal congestion, cough and watery eyes since yesterday.  She has tried taking Coricidin with some relief.  She currently denies any fever, chills, dyspnea or wheezing.  Her home COVID test was negative. ? ?The patient does not have symptoms concerning for COVID-19 infection (fever, chills, cough, or new shortness of breath).  ? ?Past Medical, Surgical, Social History, Allergies, and Medications have been Reviewed. ? ?Past Medical History:  ?Diagnosis Date  ? Breast cancer ([place])   ? Tamoxifen started 2007  ? CAD (coronary artery disease)   ? DES second diagonal 2/12  ? Cardiomyopathy   ?  Probable Takotsubo, LVEF 30-35% 2/12  ? Carotid artery disease ([place])   ? Essential hypertension   ? Hepatic steatosis   ? Hyperlipidemia   ? Hypothyroidism   ? NSTEMI (non-ST elevated myocardial infarction) ([place])   ? 2/12  ? Pancreas divisum   ? Stroke [facility])   ? (2000) residual left-sided weakness  ? Type 2 diabetes mellitus ([place])   ? reports not currently diabetic  ? ?Past Surgical History:  ?Procedure Laterality Date  ? ABDOMINAL HYSTERECTOMY    ? CAROTID ENDARTERECTOMY    ? Left  ? CHOLECYSTECTOMY    ? COLONOSCOPY WITH PROPOFOL N/A 05/03/2020  ? Procedure: COLONOSCOPY WITH PROPOFOL;  Surgeon: Daneil Dolin, MD;  [facility];  Service: Endoscopy;  Laterality: N/A;  ? ESOPHAGOGASTRODUODENOSCOPY (EGD) WITH PROPOFOL N/A 05/02/2020  ? Procedure: ESOPHAGOGASTRODUODENOSCOPY (EGD) WITH PROPOFOL;  Surgeon: Rogene Houston, MD;  Location: [facility];  Service: Endoscopy;  Laterality: N/A;  ? GIVENS CAPSULE STUDY N/A 05/04/2020  ? Procedure: GIVENS CAPSULE STUDY;  Surgeon: Harvel Quale, MD;  Location: [facility];  Service: Gastroenterology;  Laterality: N/A;  ? JOINT REPLACEMENT    ? Right knee [facility] [facility]. Sebastian [facility]  ? MASTECTOMY    ? Bilateral  ? POLYPECTOMY  05/03/2020  ? Procedure: POLYPECTOMY;  Surgeon: Daneil Dolin, MD;  [facility]: [facility];  Service: Endoscopy;;  ?  ? ?Current Meds  ?Medication Sig  ? acetaminophen (TYLENOL) 325 MG tablet Take 2 tablets (650 mg total) by mouth every 6 (six) hours as needed for mild pain, fever  or headache (or Fever >/= 101).  ? amLODipine (NORVASC) 10 MG tablet Take 1 tablet (10 mg total) by mouth daily.  ? anastrozole (ARIMIDEX) 1 MG tablet TAKE 1 TABLET BY MOUTH EVERY DAY  ? apixaban (ELIQUIS) 5 MG TABS tablet Take 1 tablet (5 mg total) by mouth 2 (two) times daily.  ? aspirin 81 MG tablet Take 1 tablet (81 mg total) by mouth daily with breakfast.  ? carvedilol (COREG) 12.5 MG tablet Take 1 tablet (12.5 mg total) by mouth 2 (two)  times daily with a meal.  ? Cholecalciferol (VITAMIN D3) 125 MCG (5000 UT) CAPS TAKE 1 CAPSULE BY MOUTH ONCE DAILY (Patient taking differently: Take 1 capsule by mouth daily.)  ? clopidogrel (PLAVIX) 75 MG tablet Take 1 tablet (75 mg total) by mouth daily.  ? dicyclomine (BENTYL) 10 MG capsule Take 1 capsule (10 mg total) by mouth 3 (three) times daily as needed for spasms.  ? ferrous sulfate 325 (65 FE) MG tablet Take 1 tablet (325 mg total) by mouth daily with breakfast.  ? furosemide (LASIX) 20 MG tablet TAKE 1 TABLET BY MOUTH EVERY DAY AS NEEDED AS DIRECTED  ? gabapentin (NEURONTIN) 100 MG capsule Take 1 capsule (100 mg total) by mouth at bedtime.  ? HYDROcodone-acetaminophen (NORCO/VICODIN) 5-325 MG tablet Take 1 tablet by mouth every 12 (twelve) hours as needed for moderate pain.  ? isosorbide mononitrate (IMDUR) 30 MG 24 hr tablet Take 1 tablet (30 mg total) by mouth daily.  ? KLOR-CON M10 10 MEQ tablet TAKE 1 TABLET BY MOUTH EVERY DAY  ? naproxen (NAPROSYN) 250 MG tablet Take 1 tablet (250 mg total) by mouth 2 (two) times daily with a meal.  ? nitroGLYCERIN (NITROSTAT) 0.4 MG SL tablet Place 1 tablet (0.4 mg total) under the tongue every 5 (five) minutes x 3 doses as needed.  ? Omega-3 Fatty Acids 300 MG CAPS Take 1 capsule (300 mg total) by mouth 2 (two) times daily.  ? pantoprazole (PROTONIX) 40 MG tablet Take 1 tablet (40 mg total) by mouth daily.  ? rosuvastatin (CRESTOR) 40 MG tablet Take 1 tablet (40 mg total) by mouth daily.  ? SYNTHROID 125 MCG tablet TAKE 1 TABLET BY MOUTH DAILY BEFORE BREAKFAST.  ?  ? ?Allergies:   Codeine and Tramadol  ? ?ROS:   ?Please see the history of present illness.    ? ?All other systems reviewed and are negative. ? ? ?Labs/Other Tests and Data Reviewed:   ? ?Recent Labs: ?07/19/2020: TSH 0.173 ?02/05/2021: ALT 10; BUN 22; Creatinine, Ser 0.98; Hemoglobin 11.6; Magnesium 2.0; Platelets 157; Potassium 4.0; Sodium 140  ? ?Recent Lipid Panel ?Lab Results  ?Component Value  Date/Time  ? CHOL 145 03/01/2020 10:08 AM  ? TRIG 109 03/01/2020 10:08 AM  ? HDL 46 (L) 03/01/2020 10:08 AM  ? CHOLHDL 3.2 03/01/2020 10:08 AM  ? LDLCALC 80 03/01/2020 10:08 AM  ? ? ?Wt Readings from Last 3 Encounters:  ?04/15/21 216 lb (98 kg)  ?04/02/21 214 lb (97.1 kg)  ?02/14/21 215 lb 3.2 oz (97.6 kg)  ?  ? ?ASSESSMENT & PLAN:   ? ?Allergic rhinitis ?Likely due to pollen allergy ?Started Zyrtec for allergies ?Tessalon as needed for cough ?Advised to use Flonase as needed for allergies ? ?Time:   ?Today, I have spent 9 minutes reviewing the chart, including problem list, medications, and with the patient with telehealth technology discussing the above problems. ? ? ?Medication Adjustments/Labs and Tests Ordered: ?Current medicines are reviewed  at length with the patient today.  Concerns regarding medicines are outlined above.  ? ?Tests Ordered: ?No orders of the defined types were placed in this encounter. ? ? ?Medication Changes: ?No orders of the defined types were placed in this encounter. ? ? ? ?Note: This dictation was prepared with Dragon dictation along with smaller phrase technology. Similar sounding words can be transcribed inadequately or may not be corrected upon review. Any transcriptional errors that result from this process are unintentional.  ?  ? ? ?Disposition:  Follow up  ?Signed, ?[name], MD  ?05/07/2021 2:20 PM    ? ?[facility] ?[facility] ?

## 2021-05-07 NOTE — Patient Instructions (Signed)
Please take Zyrtec for allergies. ? ?Please use Flonase for nasal congestion. ? ?Please take Tessalon as needed for cough. ?

## 2021-05-10 ENCOUNTER — Telehealth: Payer: Self-pay

## 2021-05-10 NOTE — Telephone Encounter (Signed)
FMLA forms ? ?Copied ?Noted ?Sleeved ? ?Call [name] when ready [phone] ?

## 2021-05-14 ENCOUNTER — Inpatient Hospital Stay (HOSPITAL_COMMUNITY): Payer: Medicare PPO | Attending: Hematology

## 2021-05-14 DIAGNOSIS — Z0279 Encounter for issue of other medical certificate: Secondary | ICD-10-CM

## 2021-05-14 NOTE — Telephone Encounter (Signed)
Called patient daughter and will pick up forms ?

## 2021-05-21 ENCOUNTER — Ambulatory Visit (HOSPITAL_COMMUNITY): Payer: Medicare PPO | Admitting: Hematology

## 2021-05-27 ENCOUNTER — Ambulatory Visit: Payer: Medicare PPO | Admitting: Orthopedic Surgery

## 2021-05-27 ENCOUNTER — Other Ambulatory Visit: Payer: Self-pay | Admitting: Internal Medicine

## 2021-06-09 ENCOUNTER — Other Ambulatory Visit: Payer: Self-pay | Admitting: Nurse Practitioner

## 2021-06-09 DIAGNOSIS — E039 Hypothyroidism, unspecified: Secondary | ICD-10-CM

## 2021-06-14 ENCOUNTER — Telehealth: Payer: Self-pay

## 2021-06-14 ENCOUNTER — Other Ambulatory Visit: Payer: Self-pay

## 2021-06-14 MED ORDER — ISOSORBIDE MONONITRATE ER 30 MG PO TB24: 30.0000 mg | ORAL_TABLET | Freq: Every day | ORAL | 2 refills | Status: DC

## 2021-06-14 NOTE — Telephone Encounter (Signed)
Refills sent

## 2021-06-14 NOTE — Telephone Encounter (Signed)
Patient daughter [name] called need refill due to holiday on Monday  isosorbide mononitrate (IMDUR) 30 MG 24 hr tablet   Pharmacy  [facility], [address]  [address] [place], [place] [address]  Phone:  [phone]  Fax:  [phone]

## 2021-06-18 ENCOUNTER — Inpatient Hospital Stay (HOSPITAL_COMMUNITY): Payer: Medicare PPO | Attending: Hematology

## 2021-06-18 DIAGNOSIS — Z170 Estrogen receptor positive status [ER+]: Secondary | ICD-10-CM | POA: Diagnosis not present

## 2021-06-18 DIAGNOSIS — C7951 Secondary malignant neoplasm of bone: Secondary | ICD-10-CM | POA: Insufficient documentation

## 2021-06-18 DIAGNOSIS — C50912 Malignant neoplasm of unspecified site of left female breast: Secondary | ICD-10-CM | POA: Insufficient documentation

## 2021-06-18 LAB — COMPREHENSIVE METABOLIC PANEL
ALT: 14 U/L (ref 0–44)
AST: 25 U/L (ref 15–41)
Albumin: 3.8 g/dL (ref 3.5–5.0)
Alkaline Phosphatase: 82 U/L (ref 38–126)
Anion gap: 8 (ref 5–15)
BUN: 26 mg/dL — ABNORMAL HIGH (ref 8–23)
CO2: 24 mmol/L (ref 22–32)
Calcium: 10.1 mg/dL (ref 8.9–10.3)
Chloride: 107 mmol/L (ref 98–111)
Creatinine, Ser: 1.37 mg/dL — ABNORMAL HIGH (ref 0.44–1.00)
GFR, Estimated: 38 mL/min — ABNORMAL LOW (ref >60)
Glucose, Bld: 113 mg/dL — ABNORMAL HIGH (ref 70–99)
Potassium: 3.5 mmol/L (ref 3.5–5.1)
Sodium: 139 mmol/L (ref 135–145)
Total Bilirubin: 0.5 mg/dL (ref 0.3–1.2)
Total Protein: 7.6 g/dL (ref 6.5–8.1)

## 2021-06-18 LAB — CBC WITH DIFFERENTIAL/PLATELET
Abs Immature Granulocytes: 0.04 10*3/uL (ref 0.00–0.07)
Basophils Absolute: 0 10*3/uL (ref 0.0–0.1)
Basophils Relative: 0 %
Eosinophils Absolute: 0.2 10*3/uL (ref 0.0–0.5)
Eosinophils Relative: 3 %
HCT: 34 % — ABNORMAL LOW (ref 36.0–46.0)
Hemoglobin: 10.8 g/dL — ABNORMAL LOW (ref 12.0–15.0)
Immature Granulocytes: 1 %
Lymphocytes Relative: 16 %
Lymphs Abs: 1.3 10*3/uL (ref 0.7–4.0)
MCH: 25.2 pg — ABNORMAL LOW (ref 26.0–34.0)
MCHC: 31.8 g/dL (ref 30.0–36.0)
MCV: 79.4 fL — ABNORMAL LOW (ref 80.0–100.0)
Monocytes Absolute: 0.7 10*3/uL (ref 0.1–1.0)
Monocytes Relative: 9 %
Neutro Abs: 5.7 10*3/uL (ref 1.7–7.7)
Neutrophils Relative %: 71 %
Platelets: 152 10*3/uL (ref 150–400)
RBC: 4.28 MIL/uL (ref 3.87–5.11)
RDW: 16.6 % — ABNORMAL HIGH (ref 11.5–15.5)
WBC: 7.9 10*3/uL (ref 4.0–10.5)
nRBC: 0 % (ref 0.0–0.2)

## 2021-06-19 ENCOUNTER — Telehealth: Payer: Self-pay | Admitting: Nurse Practitioner

## 2021-06-19 ENCOUNTER — Other Ambulatory Visit: Payer: Self-pay | Admitting: Nurse Practitioner

## 2021-06-19 DIAGNOSIS — E039 Hypothyroidism, unspecified: Secondary | ICD-10-CM

## 2021-06-19 LAB — CANCER ANTIGEN 15-3: CA 15-3: 73.7 U/mL — ABNORMAL HIGH (ref 0.0–25.0)

## 2021-06-19 LAB — CANCER ANTIGEN 27.29: CA 27.29: 117.5 U/mL — ABNORMAL HIGH (ref 0.0–38.6)

## 2021-06-19 NOTE — Telephone Encounter (Signed)
Can you update labs for her?

## 2021-06-19 NOTE — Telephone Encounter (Signed)
done

## 2021-06-21 ENCOUNTER — Other Ambulatory Visit: Payer: Self-pay

## 2021-06-21 ENCOUNTER — Emergency Department (HOSPITAL_COMMUNITY): Payer: Medicare PPO

## 2021-06-21 ENCOUNTER — Encounter (HOSPITAL_COMMUNITY): Payer: Self-pay | Admitting: *Deleted

## 2021-06-21 ENCOUNTER — Emergency Department (HOSPITAL_COMMUNITY)
Admission: EM | Admit: 2021-06-21 | Discharge: 2021-06-21 | Disposition: A | Discharge status: Home / Self Care | Payer: Medicare PPO | Attending: Emergency Medicine | Admitting: Emergency Medicine

## 2021-06-21 DIAGNOSIS — R0789 Other chest pain: Secondary | ICD-10-CM | POA: Insufficient documentation

## 2021-06-21 DIAGNOSIS — Z79899 Other long term (current) drug therapy: Secondary | ICD-10-CM | POA: Diagnosis not present

## 2021-06-21 DIAGNOSIS — Z7902 Long term (current) use of antithrombotics/antiplatelets: Secondary | ICD-10-CM | POA: Diagnosis not present

## 2021-06-21 DIAGNOSIS — R600 Localized edema: Secondary | ICD-10-CM | POA: Diagnosis not present

## 2021-06-21 DIAGNOSIS — I2510 Atherosclerotic heart disease of native coronary artery without angina pectoris: Secondary | ICD-10-CM | POA: Insufficient documentation

## 2021-06-21 DIAGNOSIS — I10 Essential (primary) hypertension: Secondary | ICD-10-CM | POA: Insufficient documentation

## 2021-06-21 DIAGNOSIS — I700 Atherosclerosis of aorta: Secondary | ICD-10-CM | POA: Diagnosis not present

## 2021-06-21 DIAGNOSIS — R079 Chest pain, unspecified: Secondary | ICD-10-CM | POA: Diagnosis not present

## 2021-06-21 DIAGNOSIS — E119 Type 2 diabetes mellitus without complications: Secondary | ICD-10-CM | POA: Insufficient documentation

## 2021-06-21 DIAGNOSIS — Z853 Personal history of malignant neoplasm of breast: Secondary | ICD-10-CM | POA: Diagnosis not present

## 2021-06-21 DIAGNOSIS — Z7982 Long term (current) use of aspirin: Secondary | ICD-10-CM | POA: Diagnosis not present

## 2021-06-21 DIAGNOSIS — R918 Other nonspecific abnormal finding of lung field: Secondary | ICD-10-CM | POA: Diagnosis not present

## 2021-06-21 LAB — TROPONIN I (HIGH SENSITIVITY)
Troponin I (High Sensitivity): 9 ng/L (ref <18)
Troponin I (High Sensitivity): 10 ng/L (ref <18)

## 2021-06-21 LAB — BASIC METABOLIC PANEL
Anion gap: 9 (ref 5–15)
BUN: 22 mg/dL (ref 8–23)
CO2: 25 mmol/L (ref 22–32)
Calcium: 10 mg/dL (ref 8.9–10.3)
Chloride: 106 mmol/L (ref 98–111)
Creatinine, Ser: 1.3 mg/dL — ABNORMAL HIGH (ref 0.44–1.00)
GFR, Estimated: 41 mL/min — ABNORMAL LOW (ref >60)
Glucose, Bld: 104 mg/dL — ABNORMAL HIGH (ref 70–99)
Potassium: 3.6 mmol/L (ref 3.5–5.1)
Sodium: 140 mmol/L (ref 135–145)

## 2021-06-21 LAB — CBC
HCT: 31.7 % — ABNORMAL LOW (ref 36.0–46.0)
Hemoglobin: 10.1 g/dL — ABNORMAL LOW (ref 12.0–15.0)
MCH: 25.4 pg — ABNORMAL LOW (ref 26.0–34.0)
MCHC: 31.9 g/dL (ref 30.0–36.0)
MCV: 79.8 fL — ABNORMAL LOW (ref 80.0–100.0)
Platelets: 182 10*3/uL (ref 150–400)
RBC: 3.97 MIL/uL (ref 3.87–5.11)
RDW: 16.8 % — ABNORMAL HIGH (ref 11.5–15.5)
WBC: 7.2 10*3/uL (ref 4.0–10.5)
nRBC: 0 % (ref 0.0–0.2)

## 2021-06-21 MED ORDER — IOHEXOL 300 MG/ML  SOLN
60.0000 mL | Freq: Once | INTRAMUSCULAR | Status: AC | PRN
  Administered 2021-06-21: 60 mL via INTRAVENOUS

## 2021-06-21 NOTE — ED Triage Notes (Signed)
Pt in c/o CP onset x 4 days ago that started on the R side and now has went to the L side that is intermittent, denies SOB, denies n/v/d, pt hx of MI, sees cardiology in [place], pt A&O x4

## 2021-06-21 NOTE — ED Provider Notes (Signed)
[facility] EMERGENCY DEPARTMENT Provider Note   CSN: [id number] Arrival date & time: 06/21/21  4098     History  Chief Complaint  Patient presents with   Chest Pain    Robin [name] is a 81 y.o. female.  HPI 81 year old female with a history of CAD, breast cancer, hypertension, hyperlipidemia, DVT, and diabetes presents with chest pain.  It has been on and off for 3 days.  Started on the right side of her chest and now is on the left.  Its not reproducible.  Sometimes breathing and makes it worse.  Hard to describe the type of pain.  Family thought it might be reflux.  Chronic leg swelling that is unchanged.  No abdominal or back pain.  No shortness of breath.  Home Medications Prior to Admission medications   Medication Sig Start Date End Date Taking? Authorizing Provider  acetaminophen (TYLENOL) 325 MG tablet Take 2 tablets (650 mg total) by mouth every 6 (six) hours as needed for mild pain, fever or headache (or Fever >/= 101). 04/06/20   Roxan Hockey, MD  amLODipine (NORVASC) 10 MG tablet Take 1 tablet (10 mg total) by mouth daily. 04/23/21   [name], MD  anastrozole (ARIMIDEX) 1 MG tablet TAKE 1 TABLET BY MOUTH EVERY DAY 04/25/21   Derek Jack, MD  apixaban (ELIQUIS) 5 MG TABS tablet Take 1 tablet (5 mg total) by mouth 2 (two) times daily. 02/14/21   Derek Jack, MD  aspirin 81 MG tablet Take 1 tablet (81 mg total) by mouth daily with breakfast. 04/06/20   Roxan Hockey, MD  benzonatate (TESSALON) 100 MG capsule Take 1 capsule (100 mg total) by mouth 2 (two) times daily as needed for cough. 05/07/21   [name], MD  carvedilol (COREG) 12.5 MG tablet TAKE 1 TABLET (12.'5MG'$  TOTAL) BY MOUTH TWICE A DAY WITH MEALS 05/27/21   [name], MD  cetirizine (ZYRTEC) 10 MG tablet Take 1 tablet (10 mg total) by mouth daily. 05/07/21   [name], MD  Cholecalciferol (VITAMIN D3) 125 MCG (5000 UT) CAPS TAKE 1 CAPSULE BY MOUTH ONCE DAILY Patient taking  differently: Take 1 capsule by mouth daily. 01/14/18   Cassandria Anger, MD  clopidogrel (PLAVIX) 75 MG tablet Take 1 tablet (75 mg total) by mouth daily. 02/05/21   [name], MD  dicyclomine (BENTYL) 10 MG capsule Take 1 capsule (10 mg total) by mouth 3 (three) times daily as needed for spasms. 11/27/20   [name], MD  ferrous sulfate 325 (65 FE) MG tablet Take 1 tablet (325 mg total) by mouth daily with breakfast. 05/06/20   Cristal Deer, MD  fluticasone (FLONASE) 50 MCG/ACT nasal spray Place 2 sprays into both nostrils daily. 05/07/21   [name], MD  furosemide (LASIX) 20 MG tablet TAKE 1 TABLET BY MOUTH EVERY DAY AS NEEDED AS DIRECTED 12/17/20   [name], MD  gabapentin (NEURONTIN) 100 MG capsule Take 1 capsule (100 mg total) by mouth at bedtime. 01/30/21   [name], MD  HYDROcodone-acetaminophen (NORCO/VICODIN) 5-325 MG tablet Take 1 tablet by mouth every 12 (twelve) hours as needed for moderate pain. 04/12/20   Derek Jack, MD  isosorbide mononitrate (IMDUR) 30 MG 24 hr tablet Take 1 tablet (30 mg total) by mouth daily. 06/14/21   [name], MD  KLOR-CON M10 10 MEQ tablet TAKE 1 TABLET BY MOUTH EVERY DAY 01/15/21   [name], MD  naproxen (NAPROSYN) 250 MG tablet Take 1 tablet (250 mg total) by mouth 2 (two) times daily with a meal. 04/02/21   Varney Biles, MD  nitroGLYCERIN (NITROSTAT) 0.4 MG SL tablet Place 1 tablet (0.4 mg total) under the tongue every 5 (five) minutes x 3 doses as needed. 01/11/14   Satira Sark, MD  Omega-3 Fatty Acids 300 MG CAPS Take 1 capsule (300 mg total) by mouth 2 (two) times daily. 04/24/11   Serpe, Burna Forts, PA-C  pantoprazole (PROTONIX) 40 MG tablet Take 1 tablet (40 mg total) by mouth daily. 04/07/20   Roxan Hockey, MD  rosuvastatin (CRESTOR) 40 MG tablet Take 1 tablet (40 mg total) by mouth daily. 03/05/21   [name], MD  SYNTHROID 125 MCG tablet TAKE 1 TABLET BY MOUTH DAILY BEFORE  BREAKFAST. 01/16/21   Brita Romp, NP      Allergies    Codeine and Tramadol    Review of Systems   Review of Systems  Constitutional:  Negative for fever.  Respiratory:  Negative for shortness of breath.   Cardiovascular:  Positive for chest pain.  Gastrointestinal:  Negative for abdominal pain and vomiting.  Musculoskeletal:  Negative for back pain.   Physical Exam Updated Vital Signs BP (!) 155/62   Pulse (!) 57   Temp 98.6 F (37 C) (Oral)   Resp (!) 21   Ht '5\' 10"'$  (1.778 m)   Wt 95.5 kg   SpO2 99%   BMI 30.19 kg/m  Physical Exam Vitals and nursing note reviewed.  Constitutional:      General: She is not in acute distress.    Appearance: She is well-developed. She is not ill-appearing or diaphoretic.  HENT:     Head: Normocephalic and atraumatic.  Cardiovascular:     Rate and Rhythm: Normal rate and regular rhythm.     Heart sounds: Normal heart sounds.  Pulmonary:     Effort: Pulmonary effort is normal.     Breath sounds: Normal breath sounds.  Abdominal:     Palpations: Abdomen is soft.     Tenderness: There is no abdominal tenderness.  Musculoskeletal:     Comments: Mild symmetric lower extremity edema  Skin:    General: Skin is warm and dry.  Neurological:     Mental Status: She is alert.    ED Results / Procedures / Treatments   Labs (all labs ordered are listed, but only abnormal results are displayed) Labs Reviewed  BASIC METABOLIC PANEL - Abnormal; Notable for the following components:      Result Value   Glucose, Bld 104 (*)    Creatinine, Ser 1.30 (*)    GFR, Estimated 41 (*)    All other components within normal limits  CBC - Abnormal; Notable for the following components:   Hemoglobin 10.1 (*)    HCT 31.7 (*)    MCV 79.8 (*)    MCH 25.4 (*)    RDW 16.8 (*)    All other components within normal limits  TROPONIN I (HIGH SENSITIVITY)  TROPONIN I (HIGH SENSITIVITY)    EKG EKG Interpretation  Date/Time:  Friday June 21 2021  12:16:22 EDT Ventricular Rate:  60 PR Interval:  189 QRS Duration: 101 QT Interval:  452 QTC Calculation: 452 R Axis:   20 Text Interpretation: EASI Derived Leads inferior lead downward QRS deflection is likely limb lead problem with downward P waves Confirmed by Sherwood Gambler [phone]) on 06/21/2021 12:53:07 PM  Radiology DG Chest  2 View  Result Date: 06/21/2021 CLINICAL DATA:  Chest pain EXAM: CHEST - 2 VIEW COMPARISON:  Chest x-ray dated May 01, 2020 FINDINGS: Cardiac and mediastinal contours are unchanged. Left basilar opacity, similar to prior exam and likely due to scarring. New lucent lesions of the second right rib and lower lateral left rib. IMPRESSION: New lucent lesions of the second right rib and lower lateral left rib, concerning for progressive osseous/chest wall metastatic disease. Could be further evaluated with contrast-enhanced chest CT. Electronically Signed   By: Yetta Glassman M.D.   On: 06/21/2021 10:46    Procedures Procedures    Medications Ordered in ED Medications  iohexol (OMNIPAQUE) 300 MG/ML solution 60 mL (60 mLs Intravenous Contrast Given 06/21/21 1514)    ED Course/ Medical Decision Making/ A&P                           Medical Decision Making Amount and/or Complexity of Data Reviewed Labs: ordered. Radiology: ordered.  Risk Prescription drug management.   CT images viewed/interpreted by myself.  Appears to have some osseous lesions.  Will get CT chest as recommended by radiology.  I did briefly consider CTA but she is already anticoagulated for pulmonary embolus and has no hypoxia, tachycardia, hypotension.  Troponins are negative x2.  Other labs are similar to baseline.  Care transferred to Dr. Alvino Chapel.        Final Clinical Impression(s) / ED Diagnoses Final diagnoses:  None    Rx / DC Orders ED Discharge Orders     None         Sherwood Gambler, MD 06/21/21 [phone]

## 2021-06-21 NOTE — ED Provider Notes (Signed)
  Physical Exam  BP 139/65   Pulse (!) 55   Temp 98.6 F (37 C) (Oral)   Resp 17   Ht '5\' 10"'$  (1.778 m)   Wt 95.5 kg   SpO2 99%   BMI 30.19 kg/m   Physical Exam  Procedures  Procedures  ED Course / MDM    Medical Decision Making Amount and/or Complexity of Data Reviewed Labs: ordered. Radiology: ordered.  Risk Prescription drug management.   Received patient in signout.  Chest pain.  History of metastatic breast cancer.  Pulmonary embolism felt less likely.  Already is anticoagulated not tachycardic or hypoxic.  EKG reassuring.  Chest x-ray showed bony lesions.  CT scan shows stable number of lesions and placement lesions but there is been some sclerotic to lytic change on someone.  Potentially could be some of the pain.  However appears stable for discharge home.  Has follow-up with both PCP and her oncologist in the next week.  Will discharge home       Davonna Belling, MD 06/21/21 1714

## 2021-06-21 NOTE — ED Notes (Signed)
Patient ambulated to bathroom with assistance and returned to room.

## 2021-06-24 ENCOUNTER — Encounter: Payer: Self-pay | Admitting: Internal Medicine

## 2021-06-24 ENCOUNTER — Ambulatory Visit (INDEPENDENT_AMBULATORY_CARE_PROVIDER_SITE_OTHER): Payer: Medicare PPO | Admitting: Internal Medicine

## 2021-06-24 VITALS — BP 122/68 | HR 58 | Ht 66.0 in | Wt 216.0 lb

## 2021-06-24 DIAGNOSIS — K219 Gastro-esophageal reflux disease without esophagitis: Secondary | ICD-10-CM

## 2021-06-24 DIAGNOSIS — C50912 Malignant neoplasm of unspecified site of left female breast: Secondary | ICD-10-CM | POA: Diagnosis not present

## 2021-06-24 DIAGNOSIS — I429 Cardiomyopathy, unspecified: Secondary | ICD-10-CM

## 2021-06-24 DIAGNOSIS — I10 Essential (primary) hypertension: Secondary | ICD-10-CM

## 2021-06-24 DIAGNOSIS — E039 Hypothyroidism, unspecified: Secondary | ICD-10-CM | POA: Diagnosis not present

## 2021-06-24 DIAGNOSIS — Z0001 Encounter for general adult medical examination with abnormal findings: Secondary | ICD-10-CM

## 2021-06-24 MED ORDER — PANTOPRAZOLE SODIUM 40 MG PO TBEC: 40.0000 mg | DELAYED_RELEASE_TABLET | Freq: Every day | ORAL | 1 refills | Status: DC

## 2021-06-24 NOTE — Patient Instructions (Signed)
Please take Pantoprazole for acid reflux/gastritis.  Please continue taking medications as prescribed.  Please consider getting Shingrix vaccine at your local pharmacy.  Please get blood tests done after 2 weeks.

## 2021-06-24 NOTE — Assessment & Plan Note (Signed)
Lab Results  Component Value Date   TSH 0.173 (L) 07/19/2020   On Levothyroxine 125 mcg QD Followed by endocrinology

## 2021-06-24 NOTE — Assessment & Plan Note (Signed)
Her epigastric pain could be due to gastritis Refilled pantoprazole

## 2021-06-24 NOTE — Assessment & Plan Note (Signed)
BP Readings from Last 1 Encounters:  06/24/21 122/68   Well-controlled Counseled for compliance with the medications Advised DASH diet and walking as tolerated

## 2021-06-24 NOTE — Assessment & Plan Note (Signed)
H/o probable Takotsubo cardiomyopathy with normalization of LVEF later on Follows up with Cardiologist On B-blocker, ARB and Lasix 

## 2021-06-24 NOTE — Assessment & Plan Note (Signed)
CT chest showed recurrence of breast ca. On Anastrozole and Ibrance F/u with Heme/Onc. 

## 2021-06-24 NOTE — Progress Notes (Signed)
Established Patient Office Visit  Subjective:  Patient ID: Robin [name], female    DOB: 09/06/1940  Age: 81 y.o. MRN: [id number]  CC:  Chief Complaint  Patient presents with   Annual Exam    cpe    HPI Robin [name] is a 81 y.o. female with past medical history of HTN, CAD s/p stent placement, carotid artery occlusion s/p carotid endarterectomy, hypothyroidism, generalized OA and GERD who presents for annual physical.  She recently went to ER for atypical chest pain, mainly on right side and in the right axillary area.  She had CT chest done, which showed metastatic bone lesions.  Of note, she has history of recurrent breast cancer. She is on Ibrance and anastrozole for recurrence of breast cancer.  She has chronic UE lymphedema, but is stable currently.  Her blood tests during ER visit showed AKI on CKD.  She is going to see Dr. Ellin Saba tomorrow and will likely get repeat blood tests.  She denies any dysuria, hematuria, urinary hesitancy or resistance.  She has chronic leg swelling, but denies any recent worsening of dyspnea.  Past Medical History:  Diagnosis Date   Breast cancer (HCC)    Tamoxifen started 2007   CAD (coronary artery disease)    DES second diagonal 2/12   Cardiomyopathy    Probable Takotsubo, LVEF 30-35% 2/12   Carotid artery disease (HCC)    Essential hypertension    Hepatic steatosis    Hyperlipidemia    Hypothyroidism    NSTEMI (non-ST elevated myocardial infarction) (HCC)    2/12   Pancreas divisum    Stroke (HCC)    (2000) residual left-sided weakness   Type 2 diabetes mellitus (HCC)    reports not currently diabetic    Past Surgical History:  Procedure Laterality Date   ABDOMINAL HYSTERECTOMY     CAROTID ENDARTERECTOMY     Left   CHOLECYSTECTOMY     COLONOSCOPY WITH PROPOFOL N/A 05/03/2020   Procedure: COLONOSCOPY WITH PROPOFOL;  Surgeon: Corbin Ade, MD;  [facility]: [facility];  Service: Endoscopy;  Laterality: N/A;    ESOPHAGOGASTRODUODENOSCOPY (EGD) WITH PROPOFOL N/A 05/02/2020   Procedure: ESOPHAGOGASTRODUODENOSCOPY (EGD) WITH PROPOFOL;  Surgeon: Malissa Hippo, MD;  [facility]: [facility];  Service: Endoscopy;  Laterality: N/A;   GIVENS CAPSULE STUDY N/A 05/04/2020   Procedure: GIVENS CAPSULE STUDY;  Surgeon: Dolores Frame, MD;  [facility]: [facility];  Service: Gastroenterology;  Laterality: N/A;   JOINT REPLACEMENT     Right [facility]- [facility]   MASTECTOMY     Bilateral   POLYPECTOMY  05/03/2020   Procedure: POLYPECTOMY;  Surgeon: Corbin Ade, MD;  [facility]: [facility];  Service: Endoscopy;;    Family History  Problem Relation Age of Onset   Heart attack Sister    Heart disease Sister    Heart attack Brother    Heart disease Brother    Throat cancer Brother    Colon cancer Neg Hx     Social History   Socioeconomic History   Marital status: Widowed    Spouse name: Not on file   Number of children: Not on file   Years of education: Not on file   Highest education level: Not on file  Occupational History   Not on file  Tobacco Use   Smoking status: Never   Smokeless tobacco: Never  Vaping Use   Vaping Use: Never used  Substance and Sexual Activity  Alcohol use: No    Alcohol/week: 0.0 standard drinks   Drug use: No   Sexual activity: Not on file  Other Topics Concern   Not on file  Social History Narrative   Not on file   Social Determinants of Health   Financial Resource Strain: Low Risk    Difficulty of Paying Living Expenses: Not hard at all  Food Insecurity: No Food Insecurity   Worried About Charity fundraiser in the Last Year: Never true   [place] in the Last Year: Never true  Transportation Needs: No Transportation Needs   Lack of Transportation (Medical): No   Lack of Transportation (Non-Medical): No  Physical Activity: Insufficiently Active   Days of Exercise per Week: 3 days   Minutes of Exercise per Session:  30 min  Stress: No Stress Concern Present   Feeling of Stress : Not at all  Social Connections: Moderately Isolated   Frequency of Communication with Friends and Family: More than three times a week   Frequency of Social Gatherings with Friends and Family: More than three times a week   Attends Religious Services: More than 4 times per year   Active Member of [employer] or Organizations: No   Attends Archivist Meetings: Never   Marital Status: Widowed  Human resources officer Violence: Not At Risk   Fear of Current or Ex-Partner: No   Emotionally Abused: No   Physically Abused: No   Sexually Abused: No    Outpatient Medications Prior to Visit  Medication Sig Dispense Refill   acetaminophen (TYLENOL) 325 MG tablet Take 2 tablets (650 mg total) by mouth every 6 (six) hours as needed for mild pain, fever or headache (or Fever >/= 101). 12 tablet 0   amLODipine (NORVASC) 10 MG tablet Take 1 tablet (10 mg total) by mouth daily. 90 tablet 3   anastrozole (ARIMIDEX) 1 MG tablet TAKE 1 TABLET BY MOUTH EVERY DAY 30 tablet 3   apixaban (ELIQUIS) 5 MG TABS tablet Take 1 tablet (5 mg total) by mouth 2 (two) times daily. 60 tablet 3   aspirin 81 MG tablet Take 1 tablet (81 mg total) by mouth daily with breakfast. 30 tablet 3   carvedilol (COREG) 12.5 MG tablet TAKE 1 TABLET (12.$RemoveBefo'5MG'qPpFJUoFUeW$  TOTAL) BY MOUTH TWICE A DAY WITH MEALS 90 tablet 3   cetirizine (ZYRTEC) 10 MG tablet Take 1 tablet (10 mg total) by mouth daily. 30 tablet 0   Cholecalciferol (VITAMIN D3) 125 MCG (5000 UT) CAPS TAKE 1 CAPSULE BY MOUTH ONCE DAILY (Patient taking differently: Take 1 capsule by mouth daily.) 90 capsule 1   dicyclomine (BENTYL) 10 MG capsule Take 1 capsule (10 mg total) by mouth 3 (three) times daily as needed for spasms. 20 capsule 0   ferrous sulfate 325 (65 FE) MG tablet Take 1 tablet (325 mg total) by mouth daily with breakfast. 30 tablet 0   fluticasone (FLONASE) 50 MCG/ACT nasal spray Place 2 sprays into both nostrils  daily. 16 g 1   furosemide (LASIX) 20 MG tablet TAKE 1 TABLET BY MOUTH EVERY DAY AS NEEDED AS DIRECTED 30 tablet 1   gabapentin (NEURONTIN) 100 MG capsule Take 1 capsule (100 mg total) by mouth at bedtime. 90 capsule 3   isosorbide mononitrate (IMDUR) 30 MG 24 hr tablet Take 1 tablet (30 mg total) by mouth daily. 90 tablet 2   KLOR-CON M10 10 MEQ tablet TAKE 1 TABLET BY MOUTH EVERY DAY 90 tablet 1  nitroGLYCERIN (NITROSTAT) 0.4 MG SL tablet Place 1 tablet (0.4 mg total) under the tongue every 5 (five) minutes x 3 doses as needed. 25 tablet 3   Omega-3 Fatty Acids 300 MG CAPS Take 1 capsule (300 mg total) by mouth 2 (two) times daily.     rosuvastatin (CRESTOR) 40 MG tablet Take 1 tablet (40 mg total) by mouth daily. 90 tablet 2   SYNTHROID 125 MCG tablet TAKE 1 TABLET BY MOUTH DAILY BEFORE BREAKFAST. 30 tablet 5   naproxen (NAPROSYN) 250 MG tablet Take 1 tablet (250 mg total) by mouth 2 (two) times daily with a meal. 6 tablet 0   pantoprazole (PROTONIX) 40 MG tablet Take 1 tablet (40 mg total) by mouth daily. 30 tablet 3   benzonatate (TESSALON) 100 MG capsule Take 1 capsule (100 mg total) by mouth 2 (two) times daily as needed for cough. (Patient not taking: Reported on 06/24/2021) 20 capsule 0   clopidogrel (PLAVIX) 75 MG tablet Take 1 tablet (75 mg total) by mouth daily. (Patient not taking: Reported on 06/24/2021) 90 tablet 3   HYDROcodone-acetaminophen (NORCO/VICODIN) 5-325 MG tablet Take 1 tablet by mouth every 12 (twelve) hours as needed for moderate pain. (Patient not taking: Reported on 06/24/2021) 20 tablet 0   No facility-administered medications prior to visit.    Allergies  Allergen Reactions   Codeine Nausea And Vomiting   Tramadol     ROS Review of Systems  Constitutional:  Negative for chills and fever.  HENT:  Negative for congestion, sinus pressure, sinus pain and sore throat.   Eyes:  Negative for pain and discharge.  Respiratory:  Negative for cough and shortness of  breath.   Cardiovascular:  Positive for chest pain (Chest wall pain) and leg swelling. Negative for palpitations.  Gastrointestinal:  Negative for abdominal pain, diarrhea, nausea and vomiting.  Endocrine: Negative for polydipsia and polyuria.  Genitourinary:  Negative for dysuria and hematuria.  Musculoskeletal:  Negative for neck pain and neck stiffness.  Skin:  Negative for rash.  Neurological:  Positive for weakness. Negative for dizziness.  Psychiatric/Behavioral:  Negative for agitation and behavioral problems.      Objective:    Physical Exam Vitals reviewed.  Constitutional:      General: She is not in acute distress.    Appearance: She is obese. She is not diaphoretic.     Comments: In wheelchair  HENT:     Head: Normocephalic and atraumatic.     Nose: Nose normal.     Mouth/Throat:     Mouth: Mucous membranes are moist.  Eyes:     General: No scleral icterus.    Extraocular Movements: Extraocular movements intact.  Cardiovascular:     Rate and Rhythm: Normal rate and regular rhythm.     Pulses: Normal pulses.     Heart sounds: Murmur (Systolic, lower right upper sternal border) heard.  Pulmonary:     Breath sounds: Normal breath sounds. No wheezing or rales.  Abdominal:     Palpations: Abdomen is soft.     Tenderness: There is no abdominal tenderness.  Musculoskeletal:     Cervical back: Neck supple. No tenderness.     Right lower leg: Edema (1+) present.     Left lower leg: Edema (1+) present.     Comments: B/l UE swelling  Skin:    General: Skin is warm.     Findings: No rash.  Neurological:     General: No focal deficit present.  Mental Status: She is alert and oriented to person, place, and time.  Psychiatric:        Mood and Affect: Mood normal.        Behavior: Behavior normal.    BP 122/68 (BP Location: Right Arm, Patient Position: Sitting, Cuff Size: Normal)   Pulse (!) 58   Ht 5\' 6"  (1.676 m)   Wt 216 lb (98 kg)   SpO2 98%   BMI 34.86  kg/m  Wt Readings from Last 3 Encounters:  06/24/21 216 lb (98 kg)  06/21/21 210 lb 6.9 oz (95.5 kg)  04/15/21 216 lb (98 kg)    Lab Results  Component Value Date   TSH 0.173 (L) 07/19/2020   Lab Results  Component Value Date   WBC 7.2 06/21/2021   HGB 10.1 (L) 06/21/2021   HCT 31.7 (L) 06/21/2021   MCV 79.8 (L) 06/21/2021   PLT 182 06/21/2021   Lab Results  Component Value Date   NA 140 06/21/2021   K 3.6 06/21/2021   CO2 25 06/21/2021   GLUCOSE 104 (H) 06/21/2021   BUN 22 06/21/2021   CREATININE 1.30 (H) 06/21/2021   BILITOT 0.5 06/18/2021   ALKPHOS 82 06/18/2021   AST 25 06/18/2021   ALT 14 06/18/2021   PROT 7.6 06/18/2021   ALBUMIN 3.8 06/18/2021   CALCIUM 10.0 06/21/2021   ANIONGAP 9 06/21/2021   EGFR 31 (L) 04/11/2020   Lab Results  Component Value Date   CHOL 145 03/01/2020   Lab Results  Component Value Date   HDL 46 (L) 03/01/2020   Lab Results  Component Value Date   LDLCALC 80 03/01/2020   Lab Results  Component Value Date   TRIG 109 03/01/2020   Lab Results  Component Value Date   CHOLHDL 3.2 03/01/2020   Lab Results  Component Value Date   HGBA1C 5.9 (H) 03/01/2020      Assessment & Plan:   Problem List Items Addressed This Visit       Cardiovascular and Mediastinum   Secondary cardiomyopathy (HCC)    H/o probable Takotsubo cardiomyopathy with normalization of LVEF later on Follows up with Cardiologist On B-blocker, ARB and Lasix       Essential hypertension, benign    BP Readings from Last 1 Encounters:  06/24/21 122/68  Well-controlled Counseled for compliance with the medications Advised DASH diet and walking as tolerated        Digestive   Gastroesophageal reflux disease    Her epigastric pain could be due to gastritis Refilled pantoprazole        Relevant Medications   pantoprazole (PROTONIX) 40 MG tablet     Endocrine   Hypothyroidism    Lab Results  Component Value Date   TSH 0.173 (L) 07/19/2020   On Levothyroxine 125 mcg QD Followed by endocrinology        Other   Recurrent malignant neoplasm of left breast (HCC)    CT chest showed recurrence of breast ca. On Anastrozole and Ibrance F/u with Heme/Onc.       Encounter for general adult medical examination with abnormal findings - Primary    Physical exam as documented. Recent blood tests from ER visit reviewed -AKI on CKD, needs to maintain adequate hydration. Needs repeat BMP       Relevant Orders   Basic Metabolic Panel (BMET)   CBC with Differential/Platelet    Meds ordered this encounter  Medications   pantoprazole (PROTONIX) 40 MG tablet  Sig: Take 1 tablet (40 mg total) by mouth daily.    Dispense:  90 tablet    Refill:  1    Follow-up: Return in about 4 months (around 10/24/2021) for CKD and HTN.    [name], MD

## 2021-06-24 NOTE — Assessment & Plan Note (Signed)
Physical exam as documented. Recent blood tests from ER visit reviewed -AKI on CKD, needs to maintain adequate hydration. Needs repeat BMP

## 2021-06-25 ENCOUNTER — Inpatient Hospital Stay (HOSPITAL_COMMUNITY): Payer: Medicare PPO

## 2021-06-25 ENCOUNTER — Inpatient Hospital Stay (HOSPITAL_COMMUNITY): Payer: Medicare PPO | Attending: Hematology | Admitting: Hematology

## 2021-06-25 ENCOUNTER — Telehealth (HOSPITAL_COMMUNITY): Payer: Self-pay | Admitting: Pharmacist

## 2021-06-25 ENCOUNTER — Other Ambulatory Visit (HOSPITAL_COMMUNITY): Payer: Self-pay | Admitting: *Deleted

## 2021-06-25 ENCOUNTER — Telehealth (HOSPITAL_COMMUNITY): Payer: Self-pay | Admitting: Pharmacy Technician

## 2021-06-25 ENCOUNTER — Other Ambulatory Visit (HOSPITAL_COMMUNITY): Payer: Self-pay

## 2021-06-25 VITALS — BP 173/51 | HR 58 | Temp 96.6°F | Resp 18 | Wt 215.5 lb

## 2021-06-25 DIAGNOSIS — Z79899 Other long term (current) drug therapy: Secondary | ICD-10-CM | POA: Diagnosis not present

## 2021-06-25 DIAGNOSIS — Z170 Estrogen receptor positive status [ER+]: Secondary | ICD-10-CM | POA: Diagnosis not present

## 2021-06-25 DIAGNOSIS — N2889 Other specified disorders of kidney and ureter: Secondary | ICD-10-CM | POA: Insufficient documentation

## 2021-06-25 DIAGNOSIS — C50912 Malignant neoplasm of unspecified site of left female breast: Secondary | ICD-10-CM | POA: Insufficient documentation

## 2021-06-25 DIAGNOSIS — C7951 Secondary malignant neoplasm of bone: Secondary | ICD-10-CM | POA: Diagnosis not present

## 2021-06-25 DIAGNOSIS — Z7901 Long term (current) use of anticoagulants: Secondary | ICD-10-CM | POA: Diagnosis not present

## 2021-06-25 DIAGNOSIS — Z79811 Long term (current) use of aromatase inhibitors: Secondary | ICD-10-CM | POA: Insufficient documentation

## 2021-06-25 DIAGNOSIS — Z7982 Long term (current) use of aspirin: Secondary | ICD-10-CM | POA: Insufficient documentation

## 2021-06-25 DIAGNOSIS — Z86718 Personal history of other venous thrombosis and embolism: Secondary | ICD-10-CM | POA: Diagnosis not present

## 2021-06-25 DIAGNOSIS — I82622 Acute embolism and thrombosis of deep veins of left upper extremity: Secondary | ICD-10-CM

## 2021-06-25 LAB — TSH: TSH: 0.694 u[IU]/mL (ref 0.450–4.500)

## 2021-06-25 LAB — T4, FREE: Free T4: 1.56 ng/dL (ref 0.82–1.77)

## 2021-06-25 MED ORDER — DENOSUMAB 120 MG/1.7ML ~~LOC~~ SOLN: 120.0000 mg | Freq: Once | SUBCUTANEOUS | Status: DC

## 2021-06-25 MED ORDER — PALBOCICLIB 100 MG PO CAPS
100.0000 mg | ORAL_CAPSULE | Freq: Every day | ORAL | 2 refills | Status: DC
  Filled 2021-06-25: qty 21, 21d supply, fill #0

## 2021-06-25 MED ORDER — TRAMADOL HCL 50 MG PO TABS: 50.0000 mg | ORAL_TABLET | Freq: Two times a day (BID) | ORAL | 0 refills | Status: DC | PRN

## 2021-06-25 MED ORDER — PALBOCICLIB 100 MG PO TABS
100.0000 mg | ORAL_TABLET | Freq: Every day | ORAL | 2 refills | Status: DC
  Filled 2021-06-25 – 2021-06-26 (×2): qty 21, 21d supply, fill #0

## 2021-06-25 NOTE — Patient Instructions (Signed)
[place] at [facility] Discharge Instructions   You were seen and examined today by Dr. Delton Coombes.  He reviewed your labs. Your cancer markers are elevated. Dr. Raliegh Ip will start you on a pill called Ibrance. You will take this pill for 3 weeks in a row with one week off before restarting again. This will be a continuous cycle. This medication will come from a specialty pharmacy. They will call you to set up delivery of this medication.   We sent in Tramadol for pain control. Take as prescribed.   We will arrange for you to have a CT scan of your abdomen and also a bone scan.    Thank you for choosing [place] at [facility] to provide your oncology and hematology care.  To afford each patient quality time with our provider, please arrive at least 15 minutes before your scheduled appointment time.   If you have a lab appointment with the [facility] please come in thru the Main Entrance and check in at the main information desk.  You need to re-schedule your appointment should you arrive 10 or more minutes late.  We strive to give you quality time with our providers, and arriving late affects you and other patients whose appointments are after yours.  Also, if you no show three or more times for appointments you may be dismissed from the clinic at the providers discretion.     Again, thank you for choosing [facility].  Our hope is that these requests will decrease the amount of time that you wait before being seen by our physicians.       _____________________________________________________________  Should you have questions after your visit to [facility], please contact our office at [phone] and follow the prompts.  Our office hours are 8:00 a.m. and 4:30 p.m. Monday - Friday.  Please note that voicemails left after 4:00 p.m. may not be returned until the following business day.  We are closed weekends and  major holidays.  You do have access to a nurse 24-7, just call the main number to the clinic [phone] and do not press any options, hold on the line and a nurse will answer the phone.    For prescription refill requests, have your pharmacy contact our office and allow 72 hours.    Due to Covid, you will need to wear a mask upon entering the hospital. If you do not have a mask, a mask will be given to you at the Main Entrance upon arrival. For doctor visits, patients may have 1 support person age 60 or older with them. For treatment visits, patients can not have anyone with them due to social distancing guidelines and our immunocompromised population.

## 2021-06-25 NOTE — Progress Notes (Signed)
[facility] [address] [place] [address]   Patient Care Team: Anabel Halon, MD as PCP - General (Internal Medicine) Jonelle Sidle, MD as PCP - Cardiology (Cardiology) Therese Sarah, RN as Oncology Nurse Navigator (Oncology)  SUMMARY OF ONCOLOGIC HISTORY: Oncology History   No history exists.    CHIEF COMPLIANT: Follow-up of left breast cancer with metastases to bones   INTERVAL HISTORY: [name] is a 81 y.o. female here today for follow up of her left breast cancer with metastases to bones. Her last visit was on 02/14/2021.   Today she reports feeling good. She continues to take anastrozole. She reports right side chest tightness which worsens with deep inspiration. She reports she has never previously taken Tramadol. Her appetite is good.   REVIEW OF SYSTEMS:   Review of Systems  Constitutional:  Negative for appetite change and fatigue.  Respiratory:  Positive for chest tightness (8/10 R side).   All other systems reviewed and are negative.  I have reviewed the past medical history, past surgical history, social history and family history with the patient and they are unchanged from previous note.   ALLERGIES:   is allergic to codeine and tramadol.   MEDICATIONS:  Current Outpatient Medications  Medication Sig Dispense Refill   acetaminophen (TYLENOL) 325 MG tablet Take 2 tablets (650 mg total) by mouth every 6 (six) hours as needed for mild pain, fever or headache (or Fever >/= 101). 12 tablet 0   amLODipine (NORVASC) 10 MG tablet Take 1 tablet (10 mg total) by mouth daily. 90 tablet 3   anastrozole (ARIMIDEX) 1 MG tablet TAKE 1 TABLET BY MOUTH EVERY DAY 30 tablet 3   apixaban (ELIQUIS) 5 MG TABS tablet Take 1 tablet (5 mg total) by mouth 2 (two) times daily. 60 tablet 3   aspirin 81 MG tablet Take 1 tablet (81 mg total) by mouth daily with breakfast. 30 tablet 3   carvedilol (COREG) 12.5 MG tablet TAKE 1 TABLET (12.5MG   TOTAL) BY MOUTH TWICE A DAY WITH MEALS 90 tablet 3   cetirizine (ZYRTEC) 10 MG tablet Take 1 tablet (10 mg total) by mouth daily. 30 tablet 0   Cholecalciferol (VITAMIN D3) 125 MCG (5000 UT) CAPS TAKE 1 CAPSULE BY MOUTH ONCE DAILY (Patient taking differently: Take 1 capsule by mouth daily.) 90 capsule 1   dicyclomine (BENTYL) 10 MG capsule Take 1 capsule (10 mg total) by mouth 3 (three) times daily as needed for spasms. 20 capsule 0   ferrous sulfate 325 (65 FE) MG tablet Take 1 tablet (325 mg total) by mouth daily with breakfast. 30 tablet 0   fluticasone (FLONASE) 50 MCG/ACT nasal spray Place 2 sprays into both nostrils daily. 16 g 1   furosemide (LASIX) 20 MG tablet TAKE 1 TABLET BY MOUTH EVERY DAY AS NEEDED AS DIRECTED 30 tablet 1   gabapentin (NEURONTIN) 100 MG capsule Take 1 capsule (100 mg total) by mouth at bedtime. 90 capsule 3   isosorbide mononitrate (IMDUR) 30 MG 24 hr tablet Take 1 tablet (30 mg total) by mouth daily. 90 tablet 2   KLOR-CON M10 10 MEQ tablet TAKE 1 TABLET BY MOUTH EVERY DAY 90 tablet 1   Omega-3 Fatty Acids 300 MG CAPS Take 1 capsule (300 mg total) by mouth 2 (two) times daily.     pantoprazole (PROTONIX) 40 MG tablet Take 1 tablet (40 mg total) by mouth daily. 90 tablet 1  rosuvastatin (CRESTOR) 40 MG tablet Take 1 tablet (40 mg total) by mouth daily. 90 tablet 2   SYNTHROID 125 MCG tablet TAKE 1 TABLET BY MOUTH DAILY BEFORE BREAKFAST. 30 tablet 5   traMADol (ULTRAM) 50 MG tablet Take 1 tablet (50 mg total) by mouth every 12 (twelve) hours as needed. 30 tablet 0   nitroGLYCERIN (NITROSTAT) 0.4 MG SL tablet Place 1 tablet (0.4 mg total) under the tongue every 5 (five) minutes x 3 doses as needed. (Patient not taking: Reported on 06/25/2021) 25 tablet 3   palbociclib (IBRANCE) 100 MG tablet Take 1 tablet (100 mg total) by mouth daily. Take for 21 days on, 7 days off, repeat every 28 days. 21 tablet 2   No current facility-administered medications for this visit.      PHYSICAL EXAMINATION: Performance status (ECOG): 2 - Symptomatic, <50% confined to bed  Vitals:   06/25/21 1110  BP: (!) 173/51  Pulse: (!) 58  Resp: 18  Temp: (!) 96.6 F (35.9 C)  SpO2: 100%   Wt Readings from Last 3 Encounters:  06/25/21 215 lb 8 oz (97.8 kg)  06/24/21 216 lb (98 kg)  06/21/21 210 lb 6.9 oz (95.5 kg)   Physical Exam Vitals reviewed.  Constitutional:      Appearance: Normal appearance.     Comments: In wheelchair  Cardiovascular:     Rate and Rhythm: Normal rate and regular rhythm.     Pulses: Normal pulses.     Heart sounds: Murmur heard.  Systolic murmur is present.  Pulmonary:     Effort: Pulmonary effort is normal.     Breath sounds: Normal breath sounds.  Chest:     Chest wall: No tenderness.  Musculoskeletal:     Right lower leg: 1+ Edema present.     Left lower leg: 1+ Edema present.  Neurological:     General: No focal deficit present.     Mental Status: She is alert and oriented to person, place, and time.  Psychiatric:        Mood and Affect: Mood normal.        Behavior: Behavior normal.    Breast Exam Chaperone: Thana Ates     LABORATORY DATA:  I have reviewed the data as listed    Latest Ref Rng & Units 06/21/2021   10:57 AM 06/18/2021    7:45 AM 02/05/2021   10:07 AM  CMP  Glucose 70 - 99 mg/dL 104   113   103    BUN 8 - 23 mg/dL $Remove'22   26   22    'jQScLJL$ Creatinine 0.44 - 1.00 mg/dL 1.30   1.37   0.98    Sodium 135 - 145 mmol/L 140   139   140    Potassium 3.5 - 5.1 mmol/L 3.6   3.5   4.0    Chloride 98 - 111 mmol/L 106   107   108    CO2 22 - 32 mmol/L $RemoveB'25   24   22    'ACdgCwzm$ Calcium 8.9 - 10.3 mg/dL 10.0   10.1   9.6    Total Protein 6.5 - 8.1 g/dL  7.6   7.7    Total Bilirubin 0.3 - 1.2 mg/dL  0.5   0.7    Alkaline Phos 38 - 126 U/L  82   63    AST 15 - 41 U/L  25   18    ALT 0 - 44 U/L  14  10     Lab Results  Component Value Date   CAN153 73.7 (H) 06/18/2021   CAN153 42.0 (H) 02/05/2021   CAN153 36.2 (H) 11/14/2020    Lab Results  Component Value Date   WBC 7.2 06/21/2021   HGB 10.1 (L) 06/21/2021   HCT 31.7 (L) 06/21/2021   MCV 79.8 (L) 06/21/2021   PLT 182 06/21/2021   NEUTROABS 5.7 06/18/2021    ASSESSMENT:  1.  Recurrent breast carcinoma with metastases to bones: -History of right breast cancer in 1987, status post right mastectomy requiring no adjuvant chemo or XRT.  Was not on tamoxifen. -History of left breast cancer in 2007, status post mastectomy by Dr. Carlis Abbott at [facility], placed on tamoxifen, discontinued around 2015. -Recent upper back pain, weight loss of 20 pounds in the last 3 to 6 months. -CT angio on 04/04/2020 showed irregular mass in the left chest wall concerning for recurrent breast cancer measuring 4.5 x 3.1 cm. -Also showed lytic lesions in T1 and T3 vertebral bodies as well as manubrium. -Left chest wall mass biopsy on 04/05/2020 consistent with adenocarcinoma compatible with recurrent breast carcinoma.  ER 95% positive, PR 40% positive, HER2 2+, negative by FISH. - PET scan on 05/15/2020 shows hypermetabolic mass in the left anterior chest wall, mild hypermetabolic thoracic lymphadenopathy, diffuse bone metastasis throughout the spine, sternum, both shoulders, pelvis, proximal right femur which are both lytic and sclerotic. - Anastrozole started around 05/21/2020.  She never started palbociclib even though it was recommended at that time.   2.  Left upper extremity DVT: -Left upper extremity Doppler on 04/04/2020 + for DVT involving left internal jugular vein, left subclavian vein, left axillary vein, left brachial and left basilic vein.   3.  Family history: - No major family history of malignancies other than her brother with throat cancer.   PLAN:  1.  Metastatic breast cancer to the bones, ER/PR positive: - She reports that she is tolerating anastrozole very well.  She has missed her appointment in April. - Last CTA CAP was on 02/11/2021 which showed unchanged  soft tissue mass in the left chest wall mastectomy site with stable multiple small lung nodules. - I have reviewed CT chest with contrast from 06/21/2021 which showed extensive skeletal metastatic disease, mostly unchanged from prior scan from January.  There is now mixed lucency along with sclerosis in the sternum, right posterior second rib and left scapula.  Small lung nodules unchanged from prior studies. - Her tumor markers have been steadily going up.  Last CA 15-3 was 73.7, CA 27-29 was 117. - Recommend baseline bone scan and CTAP with contrast. - Recommend starting Ibrance 100 mg 3 weeks on/1 week off.  I discussed side effects in detail.  We have sent it to specialty pharmacy. - RTC 2 weeks for follow-up after the start of Ibrance.  Will check ferritin and iron panel at next visit for microcytic anemia.   2.  Left upper extremity DVT in the setting of malignancy: - Continue Eliquis 5 mg twice daily.  Continue aspirin 81 mg daily.   3.  Right sternal/chest wall pain: - She was evaluated in the ER recently on 06/21/2021 and was thought to be noncardiac pain. - She also had CT of the chest with contrast which was reviewed by me.  She does not have any tenderness in the chest but has some pain on deep inspiration. - She is already on Eliquis.  We will give prescription for  tramadol 50 mg to be taken as needed.   4.  Bone metastasis: - Calcium is 10.0.  Proceed with denosumab.  5.  Left kidney mass: - Last scan showed 2.1 x 1.7 cm exophytic soft tissue lesion in the lateral inferior pole of the left kidney.  We will monitor it with another scan.  Breast Cancer therapy associated bone loss: I have recommended calcium, Vitamin D and weight bearing exercises.  Orders placed this encounter:  Orders Placed This Encounter  Procedures   NM Bone Scan Whole Body   CT Abdomen Pelvis W Contrast    The patient has a good understanding of the overall plan. She agrees with it. She will call with any  problems that may develop before the next visit here.  Derek Jack, MD [place] [phone]   I, Thana Ates, am acting as a scribe for Dr. Derek Jack.  I, Derek Jack MD, have reviewed the above documentation for accuracy and completeness, and I agree with the above.

## 2021-06-25 NOTE — Telephone Encounter (Signed)
Oral Oncology Patient Advocate Encounter   Received notification from [facility] that prior authorization for Robin [name] is required.   PA submitted on CoverMyMeds Key [id number] Status is pending   Oral Oncology Clinic will continue to follow.  [place] Patient [place] Phone [phone] Fax [phone] 06/25/2021 2:33 PM

## 2021-06-25 NOTE — Telephone Encounter (Signed)
Oral Oncology Patient Advocate Encounter  Prior Authorization for Robin [name] has been approved.    PA# [id number] Effective dates: 06/25/21 through 01/19/22  Patients co-pay is $2503.52  Oral Oncology Clinic will continue to follow.   [place] Patient [name] Phone [phone] Fax [phone] 06/25/2021 2:49 PM

## 2021-06-25 NOTE — Progress Notes (Signed)
Message received from A. Ouida Sills RN / Dr. Delton Coombes to give [name] today. Calcium 10.0.   Unable to administer [name] today due to pre-authorization sent for review. Rescheduled.

## 2021-06-25 NOTE — Progress Notes (Signed)
Patient is taking Anastrozole as prescribed.  She has not missed any doses and reports no side effects at this time.   

## 2021-06-25 NOTE — Telephone Encounter (Signed)
Oral Oncology Pharmacist Encounter  Received new prescription for Ibrance (palbociclib) for the treatment of metastatic ER/PR positive in conjunction with anastrozole, planned duration until disease progression or unacceptable drug toxicity.  CMP/CBC from 06/18/21 assessed, no relevant lab abnormalities. Prescription dose and frequency assessed.   Current medication list in Epic reviewed, no DDIs with palbociclib identified.   Evaluated chart and no patient barriers to medication adherence identified.   Prescription [facility] for benefits analysis and approval.  Oral Oncology Clinic will continue to follow for insurance authorization, copayment issues, initial counseling and start date.   Darl Pikes, PharmD, BCPS, BCOP, CPP Hematology/Oncology Clinical Pharmacist Practitioner [place]/DB/AP Oral [place] [facility] [phone]  06/25/2021 12:04 PM

## 2021-06-26 ENCOUNTER — Encounter (HOSPITAL_COMMUNITY): Payer: Self-pay | Admitting: Hematology

## 2021-06-26 ENCOUNTER — Other Ambulatory Visit (HOSPITAL_COMMUNITY): Payer: Self-pay

## 2021-06-26 NOTE — Telephone Encounter (Signed)
Oral Chemotherapy Pharmacist Encounter  Spoke with patient's daughter, she will pick of the medication from [place] and have her mother get started on Friday 06/28/21.  Patient Education I spoke with patient's daughter [name] for overview of new oral chemotherapy medication: Ibrance (palbociclib) for the treatment of metastatic ER/PR positive in conjunction with anastrozole, planned duration until disease progression or unacceptable drug toxicity.   Counseled Charlene on administration, dosing, side effects, monitoring, drug-food interactions, safe handling, storage, and disposal. Patient will take 1 tablet (100 mg total) by mouth daily. Take for 21 days on, 7 days off, repeat every 28 days.  Side effects include but not limited to: decrease wbc, nausea, fatigue, hair thinning, mouth sores.    Reviewed with Charlene importance of keeping a medication schedule and plan for any missed doses.  After discussion with Charlene no patient barriers to medication adherence identified.   Charlene voiced understanding and appreciation. All questions answered. Medication handout provided.  Provided Charlene with Oral Chemotherapy Navigation Clinic phone number. [name] knows to call the office with questions or concerns. Oral Chemotherapy Navigation Clinic will continue to follow.  Darl Pikes, PharmD, BCPS, BCOP, CPP Hematology/Oncology Clinical Pharmacist Practitioner [place]/DB/AP Oral [place] [facility] [phone]  06/26/2021 11:56 AM

## 2021-06-28 ENCOUNTER — Other Ambulatory Visit (HOSPITAL_COMMUNITY): Payer: Self-pay

## 2021-06-28 NOTE — Telephone Encounter (Signed)
Spoke with [place] on 6/6 about picking up free trial of Ibrance from [facility].  She was to pick up on 06/26/21. As of 6/9 at 12:20 pm prescription has not been picked up. Also [employer] PAP application to be signed.  Robin [name] Patient [place] Phone [phone] Fax [phone] 06/28/2021 12:21 PM

## 2021-07-01 ENCOUNTER — Other Ambulatory Visit (HOSPITAL_COMMUNITY): Payer: Self-pay

## 2021-07-02 ENCOUNTER — Ambulatory Visit: Payer: Medicare PPO | Admitting: Nurse Practitioner

## 2021-07-02 ENCOUNTER — Encounter (HOSPITAL_COMMUNITY)
Admission: RE | Admit: 2021-07-02 | Discharge: 2021-07-02 | Disposition: A | Discharge status: Home / Self Care | Payer: Medicare PPO | Source: Ambulatory Visit | Attending: Hematology | Admitting: Hematology

## 2021-07-02 ENCOUNTER — Other Ambulatory Visit (HOSPITAL_COMMUNITY): Payer: Self-pay

## 2021-07-02 DIAGNOSIS — Z853 Personal history of malignant neoplasm of breast: Secondary | ICD-10-CM | POA: Diagnosis not present

## 2021-07-02 DIAGNOSIS — C50912 Malignant neoplasm of unspecified site of left female breast: Secondary | ICD-10-CM | POA: Insufficient documentation

## 2021-07-02 DIAGNOSIS — Z96651 Presence of right artificial knee joint: Secondary | ICD-10-CM | POA: Diagnosis not present

## 2021-07-02 MED ORDER — TECHNETIUM TC 99M MEDRONATE IV KIT
20.0000 | PACK | Freq: Once | INTRAVENOUS | Status: AC | PRN
  Administered 2021-07-02: 20 via INTRAVENOUS

## 2021-07-08 ENCOUNTER — Other Ambulatory Visit (HOSPITAL_COMMUNITY): Payer: Self-pay

## 2021-07-08 NOTE — Telephone Encounter (Signed)
Prescription was picked up on 06/28/21 at 3:52 pm.

## 2021-07-09 ENCOUNTER — Other Ambulatory Visit: Payer: Self-pay

## 2021-07-09 ENCOUNTER — Emergency Department (HOSPITAL_COMMUNITY)
Admission: EM | Admit: 2021-07-09 | Discharge: 2021-07-09 | Disposition: A | Discharge status: Home / Self Care | Payer: Medicare PPO | Attending: Emergency Medicine | Admitting: Emergency Medicine

## 2021-07-09 ENCOUNTER — Encounter (HOSPITAL_COMMUNITY): Payer: Self-pay

## 2021-07-09 ENCOUNTER — Emergency Department (HOSPITAL_COMMUNITY): Payer: Medicare PPO

## 2021-07-09 DIAGNOSIS — C801 Malignant (primary) neoplasm, unspecified: Secondary | ICD-10-CM | POA: Diagnosis not present

## 2021-07-09 DIAGNOSIS — Z79899 Other long term (current) drug therapy: Secondary | ICD-10-CM | POA: Diagnosis not present

## 2021-07-09 DIAGNOSIS — Z7901 Long term (current) use of anticoagulants: Secondary | ICD-10-CM | POA: Insufficient documentation

## 2021-07-09 DIAGNOSIS — C7951 Secondary malignant neoplasm of bone: Secondary | ICD-10-CM | POA: Diagnosis not present

## 2021-07-09 DIAGNOSIS — Z85830 Personal history of malignant neoplasm of bone: Secondary | ICD-10-CM | POA: Diagnosis not present

## 2021-07-09 DIAGNOSIS — M25551 Pain in right hip: Secondary | ICD-10-CM | POA: Diagnosis not present

## 2021-07-09 DIAGNOSIS — M549 Dorsalgia, unspecified: Secondary | ICD-10-CM | POA: Diagnosis not present

## 2021-07-09 DIAGNOSIS — M545 Low back pain, unspecified: Secondary | ICD-10-CM | POA: Diagnosis not present

## 2021-07-09 DIAGNOSIS — Z853 Personal history of malignant neoplasm of breast: Secondary | ICD-10-CM | POA: Diagnosis not present

## 2021-07-09 DIAGNOSIS — M25461 Effusion, right knee: Secondary | ICD-10-CM | POA: Diagnosis not present

## 2021-07-09 DIAGNOSIS — I10 Essential (primary) hypertension: Secondary | ICD-10-CM

## 2021-07-09 DIAGNOSIS — Z7982 Long term (current) use of aspirin: Secondary | ICD-10-CM | POA: Insufficient documentation

## 2021-07-09 DIAGNOSIS — M5416 Radiculopathy, lumbar region: Secondary | ICD-10-CM | POA: Diagnosis not present

## 2021-07-09 DIAGNOSIS — Z471 Aftercare following joint replacement surgery: Secondary | ICD-10-CM | POA: Diagnosis not present

## 2021-07-09 DIAGNOSIS — M25561 Pain in right knee: Secondary | ICD-10-CM | POA: Diagnosis not present

## 2021-07-09 MED ORDER — OXYCODONE-ACETAMINOPHEN 5-325 MG PO TABS
1.0000 | ORAL_TABLET | Freq: Once | ORAL | Status: AC
  Administered 2021-07-09: 1 via ORAL
  Filled 2021-07-09: qty 1

## 2021-07-09 MED ORDER — GADOBUTROL 1 MMOL/ML IV SOLN
10.0000 mL | Freq: Once | INTRAVENOUS | Status: AC | PRN
  Administered 2021-07-09: 10 mL via INTRAVENOUS

## 2021-07-09 MED ORDER — DEXAMETHASONE SODIUM PHOSPHATE 10 MG/ML IJ SOLN
10.0000 mg | Freq: Once | INTRAMUSCULAR | Status: AC
  Administered 2021-07-09: 10 mg via INTRAVENOUS
  Filled 2021-07-09: qty 1

## 2021-07-09 MED ORDER — OXYCODONE HCL 5 MG PO TABS: 5.0000 mg | ORAL_TABLET | Freq: Four times a day (QID) | ORAL | 0 refills | Status: DC | PRN

## 2021-07-09 NOTE — ED Notes (Signed)
Pt to XR

## 2021-07-09 NOTE — ED Provider Notes (Signed)
I discussed the care with Dr. Delton Coombes of the oncology service.  He recommends doing an MRI of the thoracic and lumbar spine to further evaluate the extent of the cancer and any other potentially compressive lesions that she may qualify for a more rapid radiation treatment.  These studies of the thoracic and lumbar spine have been ordered, the patient was updated on the course and what to expect and she is agreeable.  On my exam the patient is not able to lift her right leg, she is able to fully flex and extend at the ankle to both plantarflexion and dorsiflexion, she has totally normal sensation of the right leg, it seems to be pain that is limiting her ability to lift the leg.  Reviewing the PET scan and the prior studies which have been performed it appears that she has diffuse metastatic disease of breast cancer to her bones including the bones of the pelvis and the bones of her lumbar spine.  The family is aware and the patient is aware of these findings.  Patient feels better after medications, family members at the bedside are willing to have the patient follow-up with neurosurgery, they have been given the information, Dr. Delton Coombes has been informed of the MRI results and is also agreeable.  They are agreeable to have oxycodone at home, stable for discharge   Robin Chapel, MD 07/09/21 1538

## 2021-07-09 NOTE — ED Triage Notes (Signed)
Right hip and thigh pain since Saturday. Worse this morning. Difficulty placing weight on it. Denies injury.

## 2021-07-09 NOTE — ED Notes (Signed)
Pt transported to MRI 

## 2021-07-09 NOTE — ED Notes (Signed)
No pain with palpation of leg. States only has pain when she applies weight on it. Patient was able to stand and pivot with some assistance to get from wheelchair to bed.

## 2021-07-09 NOTE — ED Provider Notes (Signed)
[facility] EMERGENCY DEPARTMENT Provider Note   CSN: [id number] Arrival date & time: 07/09/21  3818     History  Chief Complaint  Patient presents with   Leg Pain    Robin [name] is a 81 y.o. female.  Patient presents to the emergency department with complaints of pain in her lower back, right hip down to the right knee.  She has not been able to put any weight on it because of the pain.  Symptoms began 2 days ago, worse this morning.  She denies any falls or injury.       Home Medications Prior to Admission medications   Medication Sig Start Date End Date Taking? Authorizing Provider  acetaminophen (TYLENOL) 325 MG tablet Take 2 tablets (650 mg total) by mouth every 6 (six) hours as needed for mild pain, fever or headache (or Fever >/= 101). 04/06/20   Roxan Hockey, MD  amLODipine (NORVASC) 10 MG tablet Take 1 tablet (10 mg total) by mouth daily. 04/23/21   [name], MD  anastrozole (ARIMIDEX) 1 MG tablet TAKE 1 TABLET BY MOUTH EVERY DAY 04/25/21   Derek Jack, MD  apixaban (ELIQUIS) 5 MG TABS tablet Take 1 tablet (5 mg total) by mouth 2 (two) times daily. 02/14/21   Derek Jack, MD  aspirin 81 MG tablet Take 1 tablet (81 mg total) by mouth daily with breakfast. 04/06/20   Denton Brick, Courage, MD  carvedilol (COREG) 12.5 MG tablet TAKE 1 TABLET (12.'5MG'$  TOTAL) BY MOUTH TWICE A DAY WITH MEALS 05/27/21   [name], MD  cetirizine (ZYRTEC) 10 MG tablet Take 1 tablet (10 mg total) by mouth daily. 05/07/21   [name], MD  Cholecalciferol (VITAMIN D3) 125 MCG (5000 UT) CAPS TAKE 1 CAPSULE BY MOUTH ONCE DAILY Patient taking differently: Take 1 capsule by mouth daily. 01/14/18   Cassandria Anger, MD  dicyclomine (BENTYL) 10 MG capsule Take 1 capsule (10 mg total) by mouth 3 (three) times daily as needed for spasms. 11/27/20   [name], MD  ferrous sulfate 325 (65 FE) MG tablet Take 1 tablet (325 mg total) by mouth daily with breakfast. 05/06/20    Cristal Deer, MD  fluticasone (FLONASE) 50 MCG/ACT nasal spray Place 2 sprays into both nostrils daily. 05/07/21   [name], MD  furosemide (LASIX) 20 MG tablet TAKE 1 TABLET BY MOUTH EVERY DAY AS NEEDED AS DIRECTED 12/17/20   [name], MD  gabapentin (NEURONTIN) 100 MG capsule Take 1 capsule (100 mg total) by mouth at bedtime. 01/30/21   [name], MD  isosorbide mononitrate (IMDUR) 30 MG 24 hr tablet Take 1 tablet (30 mg total) by mouth daily. 06/14/21   [name], MD  KLOR-CON M10 10 MEQ tablet TAKE 1 TABLET BY MOUTH EVERY DAY 01/15/21   [name], MD  nitroGLYCERIN (NITROSTAT) 0.4 MG SL tablet Place 1 tablet (0.4 mg total) under the tongue every 5 (five) minutes x 3 doses as needed. Patient not taking: Reported on 06/25/2021 01/11/14   Satira Sark, MD  Omega-3 Fatty Acids 300 MG CAPS Take 1 capsule (300 mg total) by mouth 2 (two) times daily. 04/24/11   Serpe, Burna Forts, PA-C  palbociclib (IBRANCE) 100 MG tablet Take 1 tablet (100 mg total) by mouth daily. Take for 21 days on, 7 days off, repeat every 28 days. 06/25/21   Derek Jack, MD  pantoprazole (PROTONIX) 40 MG tablet Take 1 tablet (40 mg  total) by mouth daily. 06/24/21   [name], MD  rosuvastatin (CRESTOR) 40 MG tablet Take 1 tablet (40 mg total) by mouth daily. 03/05/21   [name], MD  SYNTHROID 125 MCG tablet TAKE 1 TABLET BY MOUTH DAILY BEFORE BREAKFAST. 01/16/21   Brita Romp, NP  traMADol (ULTRAM) 50 MG tablet Take 1 tablet (50 mg total) by mouth every 12 (twelve) hours as needed. 06/25/21   Derek Jack, MD      Allergies    Codeine    Review of Systems   Review of Systems  Physical Exam Updated Vital Signs BP (!) 167/61   Pulse 63   Temp 98.6 F (37 C)   Resp 19   Ht '5\' 6"'$  (1.676 m)   Wt 97.7 kg   SpO2 97%   BMI 34.78 kg/m  Physical Exam Vitals and nursing note reviewed.  Constitutional:      General: She is not in acute distress.     Appearance: She is well-developed.  HENT:     Head: Normocephalic and atraumatic.     Mouth/Throat:     Mouth: Mucous membranes are moist.  Eyes:     General: Vision grossly intact. Gaze aligned appropriately.     Extraocular Movements: Extraocular movements intact.     Conjunctiva/sclera: Conjunctivae normal.  Cardiovascular:     Rate and Rhythm: Normal rate and regular rhythm.     Pulses: Normal pulses.     Heart sounds: Normal heart sounds, S1 normal and S2 normal. No murmur heard.    No friction rub. No gallop.  Pulmonary:     Effort: Pulmonary effort is normal. No respiratory distress.     Breath sounds: Normal breath sounds.  Abdominal:     General: Bowel sounds are normal.     Palpations: Abdomen is soft.     Tenderness: There is no abdominal tenderness. There is no guarding or rebound.     Hernia: No hernia is present.  Musculoskeletal:        General: No swelling.     Cervical back: Full passive range of motion without pain, normal range of motion and neck supple. No spinous process tenderness or muscular tenderness. Normal range of motion.     Lumbar back: Tenderness present. Positive right straight leg raise test.     Right lower leg: No edema.     Left lower leg: No edema.  Skin:    General: Skin is warm and dry.     Capillary Refill: Capillary refill takes less than 2 seconds.     Findings: No ecchymosis, erythema, rash or wound.  Neurological:     General: No focal deficit present.     Mental Status: She is alert and oriented to person, place, and time.     GCS: GCS eye subscore is 4. GCS verbal subscore is 5. GCS motor subscore is 6.     Cranial Nerves: Cranial nerves 2-12 are intact.     Sensory: Sensation is intact.     Motor: Motor function is intact.     Coordination: Coordination is intact.  Psychiatric:        Attention and Perception: Attention normal.        Mood and Affect: Mood normal.        Speech: Speech normal.        Behavior: Behavior normal.      ED Results / Procedures / Treatments   Labs (all labs ordered are listed, but  only abnormal results are displayed) Labs Reviewed - No data to display  EKG None  Radiology No results found.  Procedures Procedures    Medications Ordered in ED Medications  oxyCODONE-acetaminophen (PERCOCET/ROXICET) 5-325 MG per tablet 1 tablet (has no administration in time range)    ED Course/ Medical Decision Making/ A&P                           Medical Decision Making Amount and/or Complexity of Data Reviewed Radiology: ordered.  Risk Prescription drug management.   Patient presents to emergency department for evaluation of right leg pain that has been present for 2 days.  Pain progressively worsened, much worse this morning.  She has not had any injury.  Pain appears to originate from the right lower back and is radicular in nature.  She has a positive straight leg raise on the right without foot drop or saddle anesthesia.  Imaging ordered and pending. Will sign out to oncoming physician to follow up.        Final Clinical Impression(s) / ED Diagnoses Final diagnoses:  Lumbar radiculopathy, acute    Rx / DC Orders ED Discharge Orders     None         Orpah Greek, MD 07/09/21 [phone]

## 2021-07-09 NOTE — ED Notes (Signed)
ED Provider at bedside. 

## 2021-07-09 NOTE — Discharge Instructions (Signed)
Your testing today shows that you have a pinched nerve in your lower back which is likely causing your symptoms.  We have given you medications here including a steroid shot as well as some pain medicines which seem to have helped but I do want you to take medication at home if you are having severe pain.  You will need to see the neurosurgeon, I have given you their phone number above, call to make an appointment to be seen within the next week  You may take 1 oxycodone 5 mg every 6 hours as needed.  Please try to take a half of a tablet to see if this helps, if it is not enough you may take 1 full tablet  Opiate medications such as Percocet or Vicodin or morphine are very strong pain medications that are also very addictive if they are taken for too long, even a single dose can predispose someone to become addicted so be very careful when taking this medication.  You should take the smallest amount possible to relieve your pain, these medications may cause constipation or nausea, please follow-up with your doctor if you are having the need for ongoing pain control despite these medications.  Additionally please be aware that these medications may cause sedation or sleepiness or alter judgment so you should not take this if you are driving a vehicle or operating heavy machinery or taking care of small children.  Thank you for allowing [place] to treat you in the emergency department today.  After reviewing your examination and potential testing that was done it appears that you are safe to go home.  I would like for you to follow-up with your doctor within the next several days, have them obtain your results and follow-up with them to review all of these tests.  If you should develop severe or worsening symptoms return to the emergency department immediately

## 2021-07-09 NOTE — ED Notes (Signed)
EDP at bedside  

## 2021-07-11 ENCOUNTER — Other Ambulatory Visit: Payer: Self-pay | Admitting: Internal Medicine

## 2021-07-12 ENCOUNTER — Telehealth (HOSPITAL_COMMUNITY): Payer: Self-pay | Admitting: Emergency Medicine

## 2021-07-12 MED ORDER — OXYCODONE-ACETAMINOPHEN 5-325 MG PO TABS: 1.0000 | ORAL_TABLET | Freq: Four times a day (QID) | ORAL | 0 refills | Status: DC | PRN

## 2021-07-19 ENCOUNTER — Ambulatory Visit (HOSPITAL_COMMUNITY): Payer: Medicare PPO

## 2021-07-19 ENCOUNTER — Other Ambulatory Visit (HOSPITAL_COMMUNITY): Payer: Medicare PPO

## 2021-07-22 ENCOUNTER — Other Ambulatory Visit: Payer: Self-pay | Admitting: Nurse Practitioner

## 2021-07-22 DIAGNOSIS — E039 Hypothyroidism, unspecified: Secondary | ICD-10-CM

## 2021-07-24 NOTE — Telephone Encounter (Signed)
Oral Oncology Patient Advocate Encounter  I have called Charlene, patients daughter, over the past few weeks to inquire about the [place] application for [place] assistance and have not received any response from her.   [place] [facility] [place] Phone [phone] Fax [phone] 07/24/2021 3:23 PM

## 2021-07-25 ENCOUNTER — Inpatient Hospital Stay (HOSPITAL_COMMUNITY): Payer: Medicare PPO | Admitting: Hematology

## 2021-07-25 ENCOUNTER — Inpatient Hospital Stay (HOSPITAL_COMMUNITY): Payer: Medicare PPO

## 2021-07-25 ENCOUNTER — Other Ambulatory Visit (HOSPITAL_COMMUNITY): Payer: Self-pay

## 2021-07-25 ENCOUNTER — Ambulatory Visit (HOSPITAL_COMMUNITY): Payer: Medicare PPO | Admitting: Hematology

## 2021-07-25 ENCOUNTER — Other Ambulatory Visit (HOSPITAL_COMMUNITY): Payer: Medicare PPO

## 2021-07-25 ENCOUNTER — Inpatient Hospital Stay (HOSPITAL_COMMUNITY): Payer: Medicare PPO | Attending: Hematology

## 2021-07-25 VITALS — BP 189/72 | HR 64 | Temp 97.9°F | Resp 16

## 2021-07-25 DIAGNOSIS — C50912 Malignant neoplasm of unspecified site of left female breast: Secondary | ICD-10-CM | POA: Insufficient documentation

## 2021-07-25 DIAGNOSIS — N2889 Other specified disorders of kidney and ureter: Secondary | ICD-10-CM | POA: Diagnosis not present

## 2021-07-25 DIAGNOSIS — Z79811 Long term (current) use of aromatase inhibitors: Secondary | ICD-10-CM | POA: Insufficient documentation

## 2021-07-25 DIAGNOSIS — I252 Old myocardial infarction: Secondary | ICD-10-CM | POA: Diagnosis not present

## 2021-07-25 DIAGNOSIS — Z79899 Other long term (current) drug therapy: Secondary | ICD-10-CM | POA: Insufficient documentation

## 2021-07-25 DIAGNOSIS — C7951 Secondary malignant neoplasm of bone: Secondary | ICD-10-CM | POA: Diagnosis not present

## 2021-07-25 DIAGNOSIS — Z7982 Long term (current) use of aspirin: Secondary | ICD-10-CM | POA: Insufficient documentation

## 2021-07-25 DIAGNOSIS — Z7901 Long term (current) use of anticoagulants: Secondary | ICD-10-CM | POA: Insufficient documentation

## 2021-07-25 DIAGNOSIS — R112 Nausea with vomiting, unspecified: Secondary | ICD-10-CM | POA: Insufficient documentation

## 2021-07-25 DIAGNOSIS — Z170 Estrogen receptor positive status [ER+]: Secondary | ICD-10-CM | POA: Diagnosis not present

## 2021-07-25 DIAGNOSIS — R0789 Other chest pain: Secondary | ICD-10-CM | POA: Insufficient documentation

## 2021-07-25 DIAGNOSIS — Z86718 Personal history of other venous thrombosis and embolism: Secondary | ICD-10-CM | POA: Diagnosis not present

## 2021-07-25 LAB — COMPREHENSIVE METABOLIC PANEL
ALT: 13 U/L (ref 0–44)
AST: 30 U/L (ref 15–41)
Albumin: 3.6 g/dL (ref 3.5–5.0)
Alkaline Phosphatase: 115 U/L (ref 38–126)
Anion gap: 7 (ref 5–15)
BUN: 27 mg/dL — ABNORMAL HIGH (ref 8–23)
CO2: 27 mmol/L (ref 22–32)
Calcium: 10.9 mg/dL — ABNORMAL HIGH (ref 8.9–10.3)
Chloride: 108 mmol/L (ref 98–111)
Creatinine, Ser: 1.11 mg/dL — ABNORMAL HIGH (ref 0.44–1.00)
GFR, Estimated: 49 mL/min — ABNORMAL LOW (ref >60)
Glucose, Bld: 100 mg/dL — ABNORMAL HIGH (ref 70–99)
Potassium: 3.5 mmol/L (ref 3.5–5.1)
Sodium: 142 mmol/L (ref 135–145)
Total Bilirubin: 0.7 mg/dL (ref 0.3–1.2)
Total Protein: 7.5 g/dL (ref 6.5–8.1)

## 2021-07-25 LAB — FERRITIN: Ferritin: 46 ng/mL (ref 11–307)

## 2021-07-25 LAB — MAGNESIUM: Magnesium: 2.1 mg/dL (ref 1.7–2.4)

## 2021-07-25 MED ORDER — PALBOCICLIB 75 MG PO CAPS
75.0000 mg | ORAL_CAPSULE | Freq: Every day | ORAL | 3 refills | Status: DC
  Filled 2021-07-25: qty 21, 21d supply, fill #0

## 2021-07-25 MED ORDER — TRAMADOL HCL 50 MG PO TABS: 50.0000 mg | ORAL_TABLET | Freq: Three times a day (TID) | ORAL | 0 refills | Status: DC | PRN

## 2021-07-25 MED ORDER — PROCHLORPERAZINE MALEATE 10 MG PO TABS: 10.0000 mg | ORAL_TABLET | Freq: Four times a day (QID) | ORAL | 4 refills | Status: DC | PRN

## 2021-07-25 MED ORDER — ALPRAZOLAM 0.25 MG PO TABS: 0.2500 mg | ORAL_TABLET | Freq: Two times a day (BID) | ORAL | 0 refills | Status: DC | PRN

## 2021-07-25 MED ORDER — METHYLPREDNISOLONE 4 MG PO TBPK
ORAL_TABLET | ORAL | 0 refills | Status: DC
  Filled 2021-07-25: qty 1, 6d supply, fill #0
  Filled 2021-07-25: qty 21, 6d supply, fill #0

## 2021-07-25 NOTE — Progress Notes (Signed)
[facility] [address] [place] [address]   Patient Care Team: Anabel Halon, MD as PCP - General (Internal Medicine) Jonelle Sidle, MD as PCP - Cardiology (Cardiology) Therese Sarah, RN as Oncology Nurse Navigator (Oncology)  SUMMARY OF ONCOLOGIC HISTORY: Oncology History   No history exists.    CHIEF COMPLIANT: Follow-up of left breast cancer with metastases to bones   INTERVAL HISTORY: Robin [name] is a 81 y.o. female here today for follow up of her left breast cancer with metastases to bones. Her last visit was on 06/25/2021.   Today she reports feeling well. She reports she has stopped Ibrance after 1 week due to increased anxiety, constant nausea, and vomiting. All of her symptoms have improved since stopping Ibrance. She denies any prior history of anxiety, and she has never taken Xanax or Ativan. She presented to the ED on 6/20 for right sided lower back and leg pain; she was unable to tolerate Percocet due to hallucinations, and her pain has not improved. She is currently taking tramadol which provides partial pain relief. This pain if preventing her from being able to walk. Her tamoxifen was stopped, and she has now started anastrozole. She reports she has had 2 MI's.   REVIEW OF SYSTEMS:   Review of Systems  Constitutional:  Negative for appetite change and fatigue.  Gastrointestinal:  Positive for constipation, nausea and vomiting.  Musculoskeletal:  Positive for arthralgias (10/10 R leg) and back pain (10/10 R lower).  Psychiatric/Behavioral:  The patient is nervous/anxious.   All other systems reviewed and are negative.   I have reviewed the past medical history, past surgical history, social history and family history with the patient and they are unchanged from previous note.   ALLERGIES:   is allergic to codeine.   MEDICATIONS:  Current Outpatient Medications  Medication Sig Dispense Refill   acetaminophen (TYLENOL)  325 MG tablet Take 2 tablets (650 mg total) by mouth every 6 (six) hours as needed for mild pain, fever or headache (or Fever >/= 101). 12 tablet 0   amLODipine (NORVASC) 10 MG tablet Take 1 tablet (10 mg total) by mouth daily. 90 tablet 3   anastrozole (ARIMIDEX) 1 MG tablet TAKE 1 TABLET BY MOUTH EVERY DAY 30 tablet 3   apixaban (ELIQUIS) 5 MG TABS tablet Take 1 tablet (5 mg total) by mouth 2 (two) times daily. 60 tablet 3   aspirin 81 MG tablet Take 1 tablet (81 mg total) by mouth daily with breakfast. 30 tablet 3   carvedilol (COREG) 12.5 MG tablet TAKE 1 TABLET (12.5MG  TOTAL) BY MOUTH TWICE A DAY WITH MEALS 90 tablet 3   cetirizine (ZYRTEC) 10 MG tablet Take 1 tablet (10 mg total) by mouth daily. 30 tablet 0   Cholecalciferol (VITAMIN D3) 125 MCG (5000 UT) CAPS TAKE 1 CAPSULE BY MOUTH ONCE DAILY (Patient taking differently: Take 1 capsule by mouth daily.) 90 capsule 1   dicyclomine (BENTYL) 10 MG capsule Take 1 capsule (10 mg total) by mouth 3 (three) times daily as needed for spasms. 20 capsule 0   ferrous sulfate 325 (65 FE) MG tablet Take 1 tablet (325 mg total) by mouth daily with breakfast. 30 tablet 0   fluticasone (FLONASE) 50 MCG/ACT nasal spray Place 2 sprays into both nostrils daily. 16 g 1   furosemide (LASIX) 20 MG tablet TAKE 1 TABLET BY MOUTH EVERY DAY AS NEEDED AS DIRECTED 30 tablet 1  gabapentin (NEURONTIN) 100 MG capsule Take 1 capsule (100 mg total) by mouth at bedtime. 90 capsule 3   isosorbide mononitrate (IMDUR) 30 MG 24 hr tablet Take 1 tablet (30 mg total) by mouth daily. 90 tablet 2   KLOR-CON M10 10 MEQ tablet TAKE 1 TABLET BY MOUTH EVERY DAY 90 tablet 1   nitroGLYCERIN (NITROSTAT) 0.4 MG SL tablet Place 1 tablet (0.4 mg total) under the tongue every 5 (five) minutes x 3 doses as needed. (Patient not taking: Reported on 06/25/2021) 25 tablet 3   Omega-3 Fatty Acids 300 MG CAPS Take 1 capsule (300 mg total) by mouth 2 (two) times daily.     oxyCODONE (ROXICODONE) 5 MG  immediate release tablet Take 1 tablet (5 mg total) by mouth every 6 (six) hours as needed for severe pain. 10 tablet 0   oxyCODONE-acetaminophen (PERCOCET/ROXICET) 5-325 MG tablet Take 1 tablet by mouth every 6 (six) hours as needed for severe pain. 15 tablet 0   palbociclib (IBRANCE) 100 MG tablet Take 1 tablet (100 mg total) by mouth daily. Take for 21 days on, 7 days off, repeat every 28 days. 21 tablet 2   pantoprazole (PROTONIX) 40 MG tablet Take 1 tablet (40 mg total) by mouth daily. 90 tablet 1   rosuvastatin (CRESTOR) 40 MG tablet Take 1 tablet (40 mg total) by mouth daily. 90 tablet 2   SYNTHROID 125 MCG tablet TAKE 1 TABLET BY MOUTH EVERY DAY BEFORE BREAKFAST 30 tablet 0   traMADol (ULTRAM) 50 MG tablet Take 1 tablet (50 mg total) by mouth every 12 (twelve) hours as needed. 30 tablet 0   No current facility-administered medications for this visit.     PHYSICAL EXAMINATION: Performance status (ECOG): 2 - Symptomatic, <50% confined to bed  There were no vitals filed for this visit. Wt Readings from Last 3 Encounters:  07/09/21 215 lb 8 oz (97.7 kg)  06/25/21 215 lb 8 oz (97.8 kg)  06/24/21 216 lb (98 kg)   Physical Exam Vitals reviewed.  Constitutional:      Appearance: Normal appearance.     Comments: In wheelchair  Cardiovascular:     Rate and Rhythm: Normal rate and regular rhythm.     Pulses: Normal pulses.     Heart sounds: Normal heart sounds.  Pulmonary:     Effort: Pulmonary effort is normal.     Breath sounds: Normal breath sounds.  Neurological:     General: No focal deficit present.     Mental Status: She is alert and oriented to person, place, and time.  Psychiatric:        Mood and Affect: Mood normal.        Behavior: Behavior normal.    Breast Exam Chaperone: Robin [name]     LABORATORY DATA:  I have reviewed the data as listed    Latest Ref Rng & Units 06/21/2021   10:57 AM 06/18/2021    7:45 AM 02/05/2021   10:07 AM  CMP  Glucose 70 - 99  mg/dL 104  113  103   BUN 8 - 23 mg/dL $Remove'22  26  22   'iKhOWzv$ Creatinine 0.44 - 1.00 mg/dL 1.30  1.37  0.98   Sodium 135 - 145 mmol/L 140  139  140   Potassium 3.5 - 5.1 mmol/L 3.6  3.5  4.0   Chloride 98 - 111 mmol/L 106  107  108   CO2 22 - 32 mmol/L $RemoveB'25  24  22   'HpJrPUAh$ Calcium  8.9 - 10.3 mg/dL 10.0  10.1  9.6   Total Protein 6.5 - 8.1 g/dL  7.6  7.7   Total Bilirubin 0.3 - 1.2 mg/dL  0.5  0.7   Alkaline Phos 38 - 126 U/L  82  63   AST 15 - 41 U/L  25  18   ALT 0 - 44 U/L  14  10    Lab Results  Component Value Date   CAN153 73.7 (H) 06/18/2021   CAN153 42.0 (H) 02/05/2021   CAN153 36.2 (H) 11/14/2020   Lab Results  Component Value Date   WBC 7.2 06/21/2021   HGB 10.1 (L) 06/21/2021   HCT 31.7 (L) 06/21/2021   MCV 79.8 (L) 06/21/2021   PLT 182 06/21/2021   NEUTROABS 5.7 06/18/2021    ASSESSMENT:  1.  Recurrent breast carcinoma with metastases to bones: -History of right breast cancer in 1987, status post right mastectomy requiring no adjuvant chemo or XRT.  Was not on tamoxifen. -History of left breast cancer in 2007, status post mastectomy by Dr. Carlis Abbott at [facility], placed on tamoxifen, discontinued around 2015. -Recent upper back pain, weight loss of 20 pounds in the last 3 to 6 months. -CT angio on 04/04/2020 showed irregular mass in the left chest wall concerning for recurrent breast cancer measuring 4.5 x 3.1 cm. -Also showed lytic lesions in T1 and T3 vertebral bodies as well as manubrium. -Left chest wall mass biopsy on 04/05/2020 consistent with adenocarcinoma compatible with recurrent breast carcinoma.  ER 95% positive, PR 40% positive, HER2 2+, negative by FISH. - PET scan on 05/15/2020 shows hypermetabolic mass in the left anterior chest wall, mild hypermetabolic thoracic lymphadenopathy, diffuse bone metastasis throughout the spine, sternum, both shoulders, pelvis, proximal right femur which are both lytic and sclerotic. - Anastrozole started around 05/21/2020.  She never  started palbociclib even though it was recommended at that time. - Bone scan (07/09/2021): Increased uptake over the right frontal bone, cervical, thoracic and lumbar spine.  Multifocal areas of increased activity noted throughout the sacrum and pelvis.  Increased activity noted over the sternum.  Increased activity noted over the proximal right humerus and over the lateral aspect of the left scapula.  Focal area of increased activity noted over the proximal right femur. - Took Ibrance 100 mg from 06/28/2021 through 07/04/2021, discontinued secondary to nervousness, nausea and vomiting.   2.  Left upper extremity DVT: -Left upper extremity Doppler on 04/04/2020 + for DVT involving left internal jugular vein, left subclavian vein, left axillary vein, left brachial and left basilic vein.   3.  Family history: - No major family history of malignancies other than her brother with throat cancer.   PLAN:  1.  Metastatic breast cancer to the bones, ER/PR positive: - She could only take Ibrance 100 mg for 1 week and had to stop it because of vomiting, nausea, nervousness. - She was evaluated in the ER on 07/09/2021 with right leg pain with weakness.  She had MRI of the thoracic and lumbar spine done which did not show any epidural disease.  However it showed disc degenerative disease.  I have reviewed all the ER and scanning records. - She has an appointment to see Dr. Rolena Infante on 08/13/2021. - She complains of pain in the right leg region.  She is taking tramadol 50 mg twice daily which is helping.  We may increase tramadol to 50 mg 3 times a day. - I will decrease Ibrance to  75 mg 3 weeks on/1 week off.  She will be given Compazine to be taken 30 minutes prior to [facility] and every 6 hours as needed. - We have also given Xanax 0.25 mg half tablet to be taken as needed for anxiety associated with Ibrance. - We will give Medrol Dosepak for right-sided sciatic pain. - She will proceed with CT CAP.  RTC 3 weeks for  follow-up to see how she is tolerating Ibrance.   2.  Left upper extremity DVT in the setting of malignancy: -Continue Eliquis 5 mg twice daily.   3.  Right sternal/chest wall pain: - Increase tramadol 50 mg to 3 times daily.   4.  Bone metastasis: - Calcium is 10.9 with albumin 3.6.  She will receive denosumab today.  5.  Left kidney mass: - Last CT scan showed 2.1 x 1.7 cm exophytic soft tissue lesion in the left lateral inferior pole of the left kidney.  We will follow-up on the CT scan.  Breast Cancer therapy associated bone loss: I have recommended calcium, Vitamin D and weight bearing exercises.  Orders placed this encounter:  No orders of the defined types were placed in this encounter.   The patient has a good understanding of the overall plan. She agrees with it. She will call with any problems that may develop before the next visit here.  Derek Jack, MD [place] [phone]   I, Robin [name], am acting as a scribe for Dr. Derek Jack.  I, Derek Jack MD, have reviewed the above documentation for accuracy and completeness, and I agree with the above.

## 2021-07-25 NOTE — Patient Instructions (Addendum)
[place] at [facility] Discharge Instructions  You were seen and examined today by Dr. Delton Coombes.  You recently missed your appointment for a CT scan, that is still needed, but it is not pressing at this time.  Dr. Delton Coombes has discussed the side effects that you experienced with Ibrance. You reported nausea, vomiting and increased anxiety. There are other options beyond [place], but they are typically not as well tolerated. Dr. Delton Coombes has recommended decreasing the dose and restarting. Try taking nausea medication (Compazine) 30 minutes prior to each dose of Ibrance. You can also take Compazine every 6 hours as needed.  Dr. Delton Coombes has recommended a steroid pack for the nerve pain. This does not work in every case, but in other cases it can help. Keep your upcoming appointment to discuss this further.  Dr. Delton Coombes discussed your most recent lab work today, which is stable. You can proceed with your Xgeva.  Follow-up as scheduled in approximately 3 weeks to discuss how you are tolerating Ibrance.  Thank you for choosing [place] at [facility] to provide your oncology and hematology care.  To afford each patient quality time with our provider, please arrive at least 15 minutes before your scheduled appointment time.   If you have a lab appointment with the [facility] please come in thru the Main Entrance and check in at the main information desk.  You need to re-schedule your appointment should you arrive 10 or more minutes late.  We strive to give you quality time with our providers, and arriving late affects you and other patients whose appointments are after yours.  Also, if you no show three or more times for appointments you may be dismissed from the clinic at the providers discretion.     Again, thank you for choosing [facility].  Our hope is that these requests will decrease the amount of time that you wait  before being seen by our physicians.       _____________________________________________________________  Should you have questions after your visit to [facility], please contact our office at [phone] and follow the prompts.  Our office hours are 8:00 a.m. and 4:30 p.m. Monday - Friday.  Please note that voicemails left after 4:00 p.m. may not be returned until the following business day.  We are closed weekends and major holidays.  You do have access to a nurse 24-7, just call the main number to the clinic [phone] and do not press any options, hold on the line and a nurse will answer the phone.    For prescription refill requests, have your pharmacy contact our office and allow 72 hours.

## 2021-07-26 ENCOUNTER — Telehealth (HOSPITAL_COMMUNITY): Payer: Self-pay | Admitting: Pharmacist

## 2021-07-26 ENCOUNTER — Other Ambulatory Visit (HOSPITAL_COMMUNITY): Payer: Self-pay

## 2021-07-26 NOTE — Telephone Encounter (Signed)
Oral Chemotherapy Pharmacist Encounter   Dose decrease prescription for Ibrance sent to [facility]. Due to high copay, ~$2,300 patient can no longer fill at [facility]. Her first fill was using a one time free voucher.   Bethena Roys, patient advocate, was trying to get in contact with patient's daughter [name] over the last several weeks, so that we could use the time she had the free voucher medication to get her set-up with the assistance company. Bethena Roys was never able to get a call back about  the application.  Office emailed over the application for [employer]. [place] following her appt on 07/25/21. Unfortunately, Charlene signed her name on the application but she is not POA for Ms. Alford High[place]. Income information was also missing from the application. Due to this, I am unable to send it off application at this time.   Once the application is sent off, it could take 1-2 weeks for them to process and approve her for assistance.  Darl Pikes, PharmD, BCPS, BCOP, CPP Hematology/Oncology Clinical Pharmacist [place]/DB/AP [facility] [phone]  07/26/2021 10:36 AM

## 2021-07-26 NOTE — Telephone Encounter (Signed)
Oral Chemotherapy Pharmacist Encounter   Signature on application updated in coordination the [name]'s daughter Robin [name]. She was also able to provided with needed income information.   Application for [place] assistance was sent to the assistance program today 07/26/21. Will continue to follow-up on approval status.   Robin [name], PharmD, BCPS, BCOP, CPP Hematology/Oncology Clinical Pharmacist [place]/DB/AP [facility] [phone]  07/26/2021 12:03 PM

## 2021-07-29 ENCOUNTER — Other Ambulatory Visit (HOSPITAL_COMMUNITY): Payer: Self-pay

## 2021-07-29 ENCOUNTER — Other Ambulatory Visit (HOSPITAL_COMMUNITY): Payer: Self-pay | Admitting: *Deleted

## 2021-07-29 DIAGNOSIS — E039 Hypothyroidism, unspecified: Secondary | ICD-10-CM

## 2021-07-29 MED ORDER — PROCHLORPERAZINE MALEATE 10 MG PO TABS: 10.0000 mg | ORAL_TABLET | Freq: Four times a day (QID) | ORAL | 4 refills | Status: DC | PRN

## 2021-07-29 MED ORDER — ALPRAZOLAM 0.25 MG PO TABS: 0.2500 mg | ORAL_TABLET | Freq: Two times a day (BID) | ORAL | 0 refills | Status: DC | PRN

## 2021-07-29 MED ORDER — LEVOTHYROXINE SODIUM 125 MCG PO TABS: 125.0000 ug | ORAL_TABLET | Freq: Every day | ORAL | 3 refills | Status: DC

## 2021-07-29 MED ORDER — PALBOCICLIB 75 MG PO TABS: 75.0000 mg | ORAL_TABLET | Freq: Every day | ORAL | 3 refills | Status: DC

## 2021-07-29 MED ORDER — TRAMADOL HCL 50 MG PO TABS: 50.0000 mg | ORAL_TABLET | Freq: Three times a day (TID) | ORAL | 0 refills | Status: DC | PRN

## 2021-07-29 MED ORDER — METHYLPREDNISOLONE 4 MG PO TBPK: ORAL_TABLET | ORAL | 0 refills | Status: DC

## 2021-08-01 ENCOUNTER — Inpatient Hospital Stay (HOSPITAL_COMMUNITY): Payer: Medicare PPO

## 2021-08-01 VITALS — BP 190/82 | HR 57 | Temp 97.0°F | Resp 18

## 2021-08-01 DIAGNOSIS — Z170 Estrogen receptor positive status [ER+]: Secondary | ICD-10-CM | POA: Diagnosis not present

## 2021-08-01 DIAGNOSIS — C7951 Secondary malignant neoplasm of bone: Secondary | ICD-10-CM

## 2021-08-01 DIAGNOSIS — C50912 Malignant neoplasm of unspecified site of left female breast: Secondary | ICD-10-CM | POA: Diagnosis not present

## 2021-08-01 DIAGNOSIS — R112 Nausea with vomiting, unspecified: Secondary | ICD-10-CM | POA: Diagnosis not present

## 2021-08-01 DIAGNOSIS — R0789 Other chest pain: Secondary | ICD-10-CM | POA: Diagnosis not present

## 2021-08-01 DIAGNOSIS — Z79899 Other long term (current) drug therapy: Secondary | ICD-10-CM | POA: Diagnosis not present

## 2021-08-01 DIAGNOSIS — I252 Old myocardial infarction: Secondary | ICD-10-CM | POA: Diagnosis not present

## 2021-08-01 DIAGNOSIS — N2889 Other specified disorders of kidney and ureter: Secondary | ICD-10-CM | POA: Diagnosis not present

## 2021-08-01 DIAGNOSIS — Z79811 Long term (current) use of aromatase inhibitors: Secondary | ICD-10-CM | POA: Diagnosis not present

## 2021-08-01 MED ORDER — DENOSUMAB 120 MG/1.7ML ~~LOC~~ SOLN
120.0000 mg | Freq: Once | SUBCUTANEOUS | Status: AC
  Administered 2021-08-01: 120 mg via SUBCUTANEOUS
  Filled 2021-08-01: qty 1.7

## 2021-08-01 NOTE — Patient Instructions (Signed)
[place]  Discharge Instructions: Thank you for choosing [facility] to provide your oncology and hematology care.  If you have a lab appointment with the [facility], please come in thru the Main Entrance and check in at the main information desk.  Wear comfortable clothing and clothing appropriate for easy access to any Portacath or PICC line.   We strive to give you quality time with your provider. You may need to reschedule your appointment if you arrive late (15 or more minutes).  Arriving late affects you and other patients whose appointments are after yours.  Also, if you miss three or more appointments without notifying the office, you may be dismissed from the clinic at the provider's discretion.      For prescription refill requests, have your pharmacy contact our office and allow 72 hours for refills to be completed.    Today you received the following chemotherapy and/or immunotherapy agents Xgeva      To help prevent nausea and vomiting after your treatment, we encourage you to take your nausea medication as directed.  BELOW ARE SYMPTOMS THAT SHOULD BE REPORTED IMMEDIATELY: *FEVER GREATER THAN 100.4 F (38 C) OR HIGHER *CHILLS OR SWEATING *NAUSEA AND VOMITING THAT IS NOT CONTROLLED WITH YOUR NAUSEA MEDICATION *UNUSUAL SHORTNESS OF BREATH *UNUSUAL BRUISING OR BLEEDING *URINARY PROBLEMS (pain or burning when urinating, or frequent urination) *BOWEL PROBLEMS (unusual diarrhea, constipation, pain near the anus) TENDERNESS IN MOUTH AND THROAT WITH OR WITHOUT PRESENCE OF ULCERS (sore throat, sores in mouth, or a toothache) UNUSUAL RASH, SWELLING OR PAIN  UNUSUAL VAGINAL DISCHARGE OR ITCHING   Items with * indicate a potential emergency and should be followed up as soon as possible or go to the Emergency Department if any problems should occur.  Please show the CHEMOTHERAPY ALERT CARD or IMMUNOTHERAPY ALERT CARD at check-in to the Emergency Department  and triage nurse.  Should you have questions after your visit or need to cancel or reschedule your appointment, please contact [facility] [phone]  and follow the prompts.  Office hours are 8:00 a.m. to 4:30 p.m. Monday - Friday. Please note that voicemails left after 4:00 p.m. may not be returned until the following business day.  We are closed weekends and major holidays. You have access to a nurse at all times for urgent questions. Please call the main number to the clinic [phone] and follow the prompts.  For any non-urgent questions, you may also contact your provider using MyChart. We now offer e-Visits for anyone 52 and older to request care online for non-urgent symptoms. For details visit [email or web address].   Also download the MyChart app! Go to the app store, search "MyChart", open the app, select [facility], and log in with your MyChart username and password.  Masks are optional in the cancer centers. If you would like for your care team to wear a mask while they are taking care of you, please let them know. For doctor visits, patients may have with them one support person who is at least 83 years old. At this time, visitors are not allowed in the infusion area.

## 2021-08-01 NOTE — Progress Notes (Signed)
Patient presents today for Xgeva. Labs on 07-25-2021. Calcium 10.9 and creatinine 1.11.

## 2021-08-01 NOTE — Telephone Encounter (Signed)
Oral Chemotherapy Pharmacist Encounter   Received fax from [place] stating that [name] was approved for [employer] assistance until 01/19/22.  They also sent a fax stating that that medication has been shipped. Called patient's daughter [name] and LVM to let her know the above information.    Darl Pikes, PharmD, BCPS, BCOP, CPP Hematology/Oncology Clinical Pharmacist [place]/DB/AP Oral [facility] [phone]  08/01/2021 8:26 AM

## 2021-08-01 NOTE — Progress Notes (Signed)
Robin [name] presents today for Xgeva injection per the provider's orders.  BP 190/87 MD notified and okay to proceed with Xgeva.  Patient to take BP medication when she returns ome and to follow up with her PCP.  Stable during administration without incident; injection site WNL; see MAR for injection details.  Patient tolerated procedure well and without incident.  No questions or complaints noted at this time. Discharge from clinic via wheelchair in stable condition.  Alert and oriented X 3.  Follow up with [facility] as scheduled.

## 2021-08-06 ENCOUNTER — Ambulatory Visit: Payer: Medicare PPO | Admitting: Cardiology

## 2021-08-06 NOTE — Progress Notes (Deleted)
Cardiology Office Note  Date: 08/06/2021   ID: Robin [name], DOB 02/07/1940, MRN [id number]  PCP:  [name], MD  Cardiologist:  Rozann Lesches, MD Electrophysiologist:  None   No chief complaint on file.   History of Present Illness: Robin [name] is an 81 y.o. female last seen in August 2022 by Mr. Leonides Sake NP.  Echocardiogram in April 2022 revealed LVEF 60 to 65% with mild diastolic dysfunction, normal RV contraction, aortic valve sclerosis without stenosis.  Past Medical History:  Diagnosis Date   Breast cancer ([place])    Tamoxifen started 2007   CAD (coronary artery disease)    DES second diagonal 2/12   Cardiomyopathy    Probable Takotsubo, LVEF 30-35% 2/12   Carotid artery disease ([facility])    Essential hypertension    Hepatic steatosis    Hyperlipidemia    Hypothyroidism    NSTEMI (non-ST elevated myocardial infarction) ([place])    2/12   Pancreas divisum    Stroke ([place])    (2000) residual left-sided weakness   Type 2 diabetes mellitus ([place])    reports not currently diabetic    Past Surgical History:  Procedure Laterality Date   ABDOMINAL HYSTERECTOMY     CAROTID ENDARTERECTOMY     Left   CHOLECYSTECTOMY     COLONOSCOPY WITH PROPOFOL N/A 05/03/2020   Procedure: COLONOSCOPY WITH PROPOFOL;  Surgeon: Daneil Dolin, MD;  [facility];  Service: Endoscopy;  Laterality: N/A;   ESOPHAGOGASTRODUODENOSCOPY (EGD) WITH PROPOFOL N/A 05/02/2020   Procedure: ESOPHAGOGASTRODUODENOSCOPY (EGD) WITH PROPOFOL;  Surgeon: Rogene Houston, MD;  [facility];  Service: Endoscopy;  Laterality: N/A;   GIVENS CAPSULE STUDY N/A 05/04/2020   Procedure: GIVENS CAPSULE STUDY;  Surgeon: Harvel Quale, MD;  Location: [facility];  Service: Gastroenterology;  Laterality: N/A;   JOINT REPLACEMENT     Right [facility]- [facility]   MASTECTOMY     Bilateral   POLYPECTOMY  05/03/2020   Procedure: POLYPECTOMY;  Surgeon: Daneil Dolin, MD;  [facility]: [facility];  Service: Endoscopy;;    Current Outpatient Medications  Medication Sig Dispense Refill   acetaminophen (TYLENOL) 325 MG tablet Take 2 tablets (650 mg total) by mouth every 6 (six) hours as needed for mild pain, fever or headache (or Fever >/= 101). 12 tablet 0   ALPRAZolam (XANAX) 0.25 MG tablet Take 1 tablet (0.25 mg total) by mouth 2 (two) times daily as needed for anxiety. 60 tablet 0   amLODipine (NORVASC) 10 MG tablet Take 1 tablet (10 mg total) by mouth daily. 90 tablet 3   anastrozole (ARIMIDEX) 1 MG tablet TAKE 1 TABLET BY MOUTH EVERY DAY 30 tablet 3   apixaban (ELIQUIS) 5 MG TABS tablet Take 1 tablet (5 mg total) by mouth 2 (two) times daily. 60 tablet 3   aspirin 81 MG tablet Take 1 tablet (81 mg total) by mouth daily with breakfast. 30 tablet 3   carvedilol (COREG) 12.5 MG tablet TAKE 1 TABLET (12.'5MG'$  TOTAL) BY MOUTH TWICE A DAY WITH MEALS 90 tablet 3   cetirizine (ZYRTEC) 10 MG tablet Take 1 tablet (10 mg total) by mouth daily. 30 tablet 0   Cholecalciferol (VITAMIN D3) 125 MCG (5000 UT) CAPS TAKE 1 CAPSULE BY MOUTH ONCE DAILY (Patient taking differently: Take 1 capsule by mouth daily.) 90 capsule 1   dicyclomine (BENTYL) 10 MG capsule Take 1 capsule (10 mg total) by mouth 3 (  three) times daily as needed for spasms. 20 capsule 0   ferrous sulfate 325 (65 FE) MG tablet Take 1 tablet (325 mg total) by mouth daily with breakfast. 30 tablet 0   fluticasone (FLONASE) 50 MCG/ACT nasal spray Place 2 sprays into both nostrils daily. 16 g 1   furosemide (LASIX) 20 MG tablet TAKE 1 TABLET BY MOUTH EVERY DAY AS NEEDED AS DIRECTED 30 tablet 1   gabapentin (NEURONTIN) 100 MG capsule Take 1 capsule (100 mg total) by mouth at bedtime. 90 capsule 3   isosorbide mononitrate (IMDUR) 30 MG 24 hr tablet Take 1 tablet (30 mg total) by mouth daily. 90 tablet 2   KLOR-CON M10 10 MEQ tablet TAKE 1 TABLET BY MOUTH EVERY DAY 90 tablet 1   levothyroxine (SYNTHROID) 125  MCG tablet Take 1 tablet (125 mcg total) by mouth daily before breakfast. 30 tablet 3   methylPREDNISolone (MEDROL DOSEPAK) 4 MG TBPK tablet Use as directed 21 each 0   nitroGLYCERIN (NITROSTAT) 0.4 MG SL tablet Place 1 tablet (0.4 mg total) under the tongue every 5 (five) minutes x 3 doses as needed. 25 tablet 3   Omega-3 Fatty Acids 300 MG CAPS Take 1 capsule (300 mg total) by mouth 2 (two) times daily.     oxyCODONE (ROXICODONE) 5 MG immediate release tablet Take 1 tablet (5 mg total) by mouth every 6 (six) hours as needed for severe pain. 10 tablet 0   oxyCODONE-acetaminophen (PERCOCET/ROXICET) 5-325 MG tablet Take 1 tablet by mouth every 6 (six) hours as needed for severe pain. 15 tablet 0   palbociclib (IBRANCE) 75 MG tablet Take 1 tablet (75 mg total) by mouth daily. Take for 21 days on, 7 days off, repeat every 28 days. 21 tablet 3   pantoprazole (PROTONIX) 40 MG tablet Take 1 tablet (40 mg total) by mouth daily. 90 tablet 1   prochlorperazine (COMPAZINE) 10 MG tablet Take 1 tablet (10 mg total) by mouth every 6 (six) hours as needed for nausea or vomiting. 60 tablet 4   rosuvastatin (CRESTOR) 40 MG tablet Take 1 tablet (40 mg total) by mouth daily. 90 tablet 2   traMADol (ULTRAM) 50 MG tablet Take 1 tablet (50 mg total) by mouth every 8 (eight) hours as needed. 90 tablet 0   No current facility-administered medications for this visit.   Allergies:  Codeine   Social History: The patient  reports that she has never smoked. She has never used smokeless tobacco. She reports that she does not drink alcohol and does not use drugs.   Family History: The patient's family history includes Heart attack in her brother and sister; Heart disease in her brother and sister; Throat cancer in her brother.   ROS:  Please see the history of present illness. Otherwise, complete review of systems is positive for {NONE DEFAULTED:18576}.  All other systems are reviewed and negative.   Physical Exam: VS:   There were no vitals taken for this visit., BMI There is no height or weight on file to calculate BMI.  Wt Readings from Last 3 Encounters:  07/09/21 215 lb 8 oz (97.7 kg)  06/25/21 215 lb 8 oz (97.8 kg)  06/24/21 216 lb (98 kg)    General: Patient appears comfortable at rest. HEENT: Conjunctiva and lids normal, oropharynx clear with moist mucosa. Neck: Supple, no elevated JVP or carotid bruits, no thyromegaly. Lungs: Clear to auscultation, nonlabored breathing at rest. Cardiac: Regular rate and rhythm, no S3 or significant systolic murmur,  no pericardial rub. Abdomen: Soft, nontender, no hepatomegaly, bowel sounds present, no guarding or rebound. Extremities: No pitting edema, distal pulses 2+. Skin: Warm and dry. Musculoskeletal: No kyphosis. Neuropsychiatric: Alert and oriented x3, affect grossly appropriate.  ECG:  An ECG dated 06/21/2021 was personally reviewed today and demonstrated:  Sinus rhythm with left anterior fascicular block and nonspecific T wave abnormalities.  Recent Labwork: 06/21/2021: Hemoglobin 10.1; Platelets 182 06/24/2021: TSH 0.694 07/25/2021: ALT 13; AST 30; BUN 27; Creatinine, Ser 1.11; Magnesium 2.1; Potassium 3.5; Sodium 142     Component Value Date/Time   CHOL 145 03/01/2020 1008   TRIG 109 03/01/2020 1008   HDL 46 (L) 03/01/2020 1008   CHOLHDL 3.2 03/01/2020 1008   VLDL 20 04/08/2016 0910   LDLCALC 80 03/01/2020 1008    Other Studies Reviewed Today:  Echocardiogram 05/02/2020:  1. Left ventricular ejection fraction, by estimation, is 60 to 65%. The  left ventricle has normal function. The left ventricle has no regional  wall motion abnormalities. Left ventricular diastolic parameters are  consistent with Grade I diastolic  dysfunction (impaired relaxation).   2. Right ventricular systolic function is normal. The right ventricular  size is normal.   3. Left atrial size was mildly dilated.   4. The mitral valve is abnormal. No evidence of mitral valve   regurgitation. No evidence of mitral stenosis. Moderate mitral annular  calcification.   5. The aortic valve is tricuspid. There is mild calcification of the  aortic valve. Aortic valve regurgitation is not visualized. Mild to  moderate aortic valve sclerosis/calcification is present, without any  evidence of aortic stenosis.   6. The inferior vena cava is normal in size with greater than 50%  respiratory variability, suggesting right atrial pressure of 3 mmHg.   Assessment and Plan:    Medication Adjustments/Labs and Tests Ordered: Current medicines are reviewed at length with the patient today.  Concerns regarding medicines are outlined above.   Tests Ordered: No orders of the defined types were placed in this encounter.   Medication Changes: No orders of the defined types were placed in this encounter.   Disposition:  Follow up {follow up:15908}  Signed, Satira Sark, MD, [facility] 08/06/2021 9:35 AM    [facility] [place], [place], [place] [address] Phone: [phone]; Fax: [phone]

## 2021-08-13 DIAGNOSIS — M5451 Vertebrogenic low back pain: Secondary | ICD-10-CM | POA: Diagnosis not present

## 2021-08-15 ENCOUNTER — Inpatient Hospital Stay (HOSPITAL_COMMUNITY): Payer: Medicare PPO | Admitting: Hematology

## 2021-08-15 ENCOUNTER — Inpatient Hospital Stay (HOSPITAL_COMMUNITY): Payer: Medicare PPO

## 2021-08-15 VITALS — BP 106/78 | HR 64 | Temp 98.3°F | Resp 18 | Ht 66.0 in | Wt 215.8 lb

## 2021-08-15 DIAGNOSIS — N2889 Other specified disorders of kidney and ureter: Secondary | ICD-10-CM | POA: Diagnosis not present

## 2021-08-15 DIAGNOSIS — C7951 Secondary malignant neoplasm of bone: Secondary | ICD-10-CM

## 2021-08-15 DIAGNOSIS — Z170 Estrogen receptor positive status [ER+]: Secondary | ICD-10-CM | POA: Diagnosis not present

## 2021-08-15 DIAGNOSIS — C50912 Malignant neoplasm of unspecified site of left female breast: Secondary | ICD-10-CM | POA: Diagnosis not present

## 2021-08-15 DIAGNOSIS — Z79899 Other long term (current) drug therapy: Secondary | ICD-10-CM | POA: Diagnosis not present

## 2021-08-15 DIAGNOSIS — R0789 Other chest pain: Secondary | ICD-10-CM | POA: Diagnosis not present

## 2021-08-15 DIAGNOSIS — Z79811 Long term (current) use of aromatase inhibitors: Secondary | ICD-10-CM | POA: Diagnosis not present

## 2021-08-15 DIAGNOSIS — R112 Nausea with vomiting, unspecified: Secondary | ICD-10-CM | POA: Diagnosis not present

## 2021-08-15 DIAGNOSIS — I252 Old myocardial infarction: Secondary | ICD-10-CM | POA: Diagnosis not present

## 2021-08-15 LAB — CBC WITH DIFFERENTIAL/PLATELET
Abs Immature Granulocytes: 0.04 10*3/uL (ref 0.00–0.07)
Basophils Absolute: 0 10*3/uL (ref 0.0–0.1)
Basophils Relative: 0 %
Eosinophils Absolute: 0.1 10*3/uL (ref 0.0–0.5)
Eosinophils Relative: 2 %
HCT: 30.4 % — ABNORMAL LOW (ref 36.0–46.0)
Hemoglobin: 9.2 g/dL — ABNORMAL LOW (ref 12.0–15.0)
Immature Granulocytes: 1 %
Lymphocytes Relative: 12 %
Lymphs Abs: 0.9 10*3/uL (ref 0.7–4.0)
MCH: 24 pg — ABNORMAL LOW (ref 26.0–34.0)
MCHC: 30.3 g/dL (ref 30.0–36.0)
MCV: 79.4 fL — ABNORMAL LOW (ref 80.0–100.0)
Monocytes Absolute: 0.6 10*3/uL (ref 0.1–1.0)
Monocytes Relative: 8 %
Neutro Abs: 5.7 10*3/uL (ref 1.7–7.7)
Neutrophils Relative %: 77 %
Platelets: 190 10*3/uL (ref 150–400)
RBC: 3.83 MIL/uL — ABNORMAL LOW (ref 3.87–5.11)
RDW: 16.5 % — ABNORMAL HIGH (ref 11.5–15.5)
WBC: 7.4 10*3/uL (ref 4.0–10.5)
nRBC: 0 % (ref 0.0–0.2)

## 2021-08-15 LAB — COMPREHENSIVE METABOLIC PANEL
ALT: 13 U/L (ref 0–44)
AST: 30 U/L (ref 15–41)
Albumin: 3.2 g/dL — ABNORMAL LOW (ref 3.5–5.0)
Alkaline Phosphatase: 176 U/L — ABNORMAL HIGH (ref 38–126)
Anion gap: 8 (ref 5–15)
BUN: 21 mg/dL (ref 8–23)
CO2: 23 mmol/L (ref 22–32)
Calcium: 9.5 mg/dL (ref 8.9–10.3)
Chloride: 108 mmol/L (ref 98–111)
Creatinine, Ser: 1.12 mg/dL — ABNORMAL HIGH (ref 0.44–1.00)
GFR, Estimated: 49 mL/min — ABNORMAL LOW (ref >60)
Glucose, Bld: 107 mg/dL — ABNORMAL HIGH (ref 70–99)
Potassium: 3.4 mmol/L — ABNORMAL LOW (ref 3.5–5.1)
Sodium: 139 mmol/L (ref 135–145)
Total Bilirubin: 0.6 mg/dL (ref 0.3–1.2)
Total Protein: 6.9 g/dL (ref 6.5–8.1)

## 2021-08-15 LAB — MAGNESIUM: Magnesium: 2.2 mg/dL (ref 1.7–2.4)

## 2021-08-15 NOTE — Patient Instructions (Addendum)
Robin [name] at [facility] Discharge Instructions   You were seen and examined today by Dr. Delton Coombes.  He reviewed the results of your lab work which is normal/stable. Your cancer marker results are pending.   The plan for now is to start Ibrance and continue taking anastrozole.   For the first few days you start the pill, take Compazine prior to taking the pill to reduce nausea.   We will see you back in 3 weeks.    Thank you for choosing [place] at [facility] to provide your oncology and hematology care.  To afford each patient quality time with our provider, please arrive at least 15 minutes before your scheduled appointment time.   If you have a lab appointment with the [facility] please come in thru the Main Entrance and check in at the main information desk.  You need to re-schedule your appointment should you arrive 10 or more minutes late.  We strive to give you quality time with our providers, and arriving late affects you and other patients whose appointments are after yours.  Also, if you no show three or more times for appointments you may be dismissed from the clinic at the providers discretion.     Again, thank you for choosing [facility].  Our hope is that these requests will decrease the amount of time that you wait before being seen by our physicians.       _____________________________________________________________  Should you have questions after your visit to [facility], please contact our office at [phone] and follow the prompts.  Our office hours are 8:00 a.m. and 4:30 p.m. Monday - Friday.  Please note that voicemails left after 4:00 p.m. may not be returned until the following business day.  We are closed weekends and major holidays.  You do have access to a nurse 24-7, just call the main number to the clinic [phone] and do not press any options, hold on the line and a nurse  will answer the phone.    For prescription refill requests, have your pharmacy contact our office and allow 72 hours.    Due to Covid, you will need to wear a mask upon entering the hospital. If you do not have a mask, a mask will be given to you at the Main Entrance upon arrival. For doctor visits, patients may have 1 support person age 45 or older with them. For treatment visits, patients can not have anyone with them due to social distancing guidelines and our immunocompromised population.

## 2021-08-15 NOTE — Progress Notes (Signed)
[facility] [address] [place] [address]   Patient Care Team: [name], MD as PCP - General (Internal Medicine) Satira Sark, MD as PCP - Cardiology (Cardiology) Brien Mates, RN as Oncology Nurse Navigator (Oncology) Derek Jack, MD as Medical Oncologist (Medical Oncology)  CHIEF COMPLIANT: Follow-up of left breast cancer with metastases to bones   INTERVAL HISTORY: Robin [name] is a 81 y.o. female here today for follow up of her left breast cancer with metastases to bones. Her last visit was on 07/25/2021.   Today she reports feeling good. She has not restarted Ibrance as she reports she is nervous, but she reports she would like to start it. She continues to take anastrozole. Her weight is stable. She continues to take Tramadol TID. She has not been taking Xanax.   REVIEW OF SYSTEMS:   Review of Systems  Constitutional:  Negative for appetite change and unexpected weight change.  Musculoskeletal:  Positive for back pain (7/10).  All other systems reviewed and are negative.   I have reviewed the past medical history, past surgical history, social history and family history with the patient and they are unchanged from previous note.   ALLERGIES:   is allergic to codeine.   MEDICATIONS:  Current Outpatient Medications  Medication Sig Dispense Refill   acetaminophen (TYLENOL) 325 MG tablet Take 2 tablets (650 mg total) by mouth every 6 (six) hours as needed for mild pain, fever or headache (or Fever >/= 101). 12 tablet 0   ALPRAZolam (XANAX) 0.25 MG tablet Take 1 tablet (0.25 mg total) by mouth 2 (two) times daily as needed for anxiety. 60 tablet 0   amLODipine (NORVASC) 10 MG tablet Take 1 tablet (10 mg total) by mouth daily. 90 tablet 3   anastrozole (ARIMIDEX) 1 MG tablet TAKE 1 TABLET BY MOUTH EVERY DAY 30 tablet 3   apixaban (ELIQUIS) 5 MG TABS tablet Take 1 tablet (5 mg total) by mouth 2 (two) times daily. 60  tablet 3   aspirin 81 MG tablet Take 1 tablet (81 mg total) by mouth daily with breakfast. 30 tablet 3   carvedilol (COREG) 12.5 MG tablet TAKE 1 TABLET (12.5MG TOTAL) BY MOUTH TWICE A DAY WITH MEALS 90 tablet 3   cetirizine (ZYRTEC) 10 MG tablet Take 1 tablet (10 mg total) by mouth daily. 30 tablet 0   Cholecalciferol (VITAMIN D3) 125 MCG (5000 UT) CAPS TAKE 1 CAPSULE BY MOUTH ONCE DAILY (Patient taking differently: Take 1 capsule by mouth daily.) 90 capsule 1   dicyclomine (BENTYL) 10 MG capsule Take 1 capsule (10 mg total) by mouth 3 (three) times daily as needed for spasms. 20 capsule 0   ferrous sulfate 325 (65 FE) MG tablet Take 1 tablet (325 mg total) by mouth daily with breakfast. 30 tablet 0   fluticasone (FLONASE) 50 MCG/ACT nasal spray Place 2 sprays into both nostrils daily. 16 g 1   furosemide (LASIX) 20 MG tablet TAKE 1 TABLET BY MOUTH EVERY DAY AS NEEDED AS DIRECTED 30 tablet 1   gabapentin (NEURONTIN) 100 MG capsule Take 1 capsule (100 mg total) by mouth at bedtime. 90 capsule 3   isosorbide mononitrate (IMDUR) 30 MG 24 hr tablet Take 1 tablet (30 mg total) by mouth daily. 90 tablet 2   KLOR-CON M10 10 MEQ tablet TAKE 1 TABLET BY MOUTH EVERY DAY 90 tablet 1   levothyroxine (SYNTHROID) 125 MCG tablet Take 1 tablet (125 mcg  total) by mouth daily before breakfast. 30 tablet 3   methylPREDNISolone (MEDROL DOSEPAK) 4 MG TBPK tablet Use as directed 21 each 0   nitroGLYCERIN (NITROSTAT) 0.4 MG SL tablet Place 1 tablet (0.4 mg total) under the tongue every 5 (five) minutes x 3 doses as needed. 25 tablet 3   Omega-3 Fatty Acids 300 MG CAPS Take 1 capsule (300 mg total) by mouth 2 (two) times daily.     oxyCODONE (ROXICODONE) 5 MG immediate release tablet Take 1 tablet (5 mg total) by mouth every 6 (six) hours as needed for severe pain. 10 tablet 0   oxyCODONE-acetaminophen (PERCOCET/ROXICET) 5-325 MG tablet Take 1 tablet by mouth every 6 (six) hours as needed for severe pain. 15 tablet 0    palbociclib (IBRANCE) 75 MG tablet Take 1 tablet (75 mg total) by mouth daily. Take for 21 days on, 7 days off, repeat every 28 days. 21 tablet 3   pantoprazole (PROTONIX) 40 MG tablet Take 1 tablet (40 mg total) by mouth daily. 90 tablet 1   prochlorperazine (COMPAZINE) 10 MG tablet Take 1 tablet (10 mg total) by mouth every 6 (six) hours as needed for nausea or vomiting. 60 tablet 4   rosuvastatin (CRESTOR) 40 MG tablet Take 1 tablet (40 mg total) by mouth daily. 90 tablet 2   traMADol (ULTRAM) 50 MG tablet Take 1 tablet (50 mg total) by mouth every 8 (eight) hours as needed. 90 tablet 0   No current facility-administered medications for this visit.     PHYSICAL EXAMINATION: Performance status (ECOG): 2 - Symptomatic, <50% confined to bed  There were no vitals filed for this visit. Wt Readings from Last 3 Encounters:  07/09/21 215 lb 8 oz (97.7 kg)  06/25/21 215 lb 8 oz (97.8 kg)  06/24/21 216 lb (98 kg)   Physical Exam Vitals reviewed.  Constitutional:      Appearance: Normal appearance. She is obese.     Comments: In wheelchair  Cardiovascular:     Rate and Rhythm: Normal rate and regular rhythm.     Pulses: Normal pulses.     Heart sounds: Normal heart sounds.  Pulmonary:     Effort: Pulmonary effort is normal.     Breath sounds: Normal breath sounds.  Neurological:     General: No focal deficit present.     Mental Status: She is alert and oriented to person, place, and time.  Psychiatric:        Mood and Affect: Mood normal.        Behavior: Behavior normal.     Breast Exam Chaperone: Thana Ates     LABORATORY DATA:  I have reviewed the data as listed    Latest Ref Rng & Units 08/15/2021   10:24 AM 07/25/2021    1:07 PM 06/21/2021   10:57 AM  CMP  Glucose 70 - 99 mg/dL 107  100  104   BUN 8 - 23 mg/dL _0 Creatinine 0.44 - 1.00 mg/dL 1.12  1.11  1.30   Sodium 135 - 145 mmol/L 139  142  140   Potassium 3.5 - 5.1 mmol/L 3.4  3.5  3.6   Chloride 98 -  111 mmol/L 108  108  106   CO2 22 - 32 mmol/L _1 Calcium 8.9 - 10.3 mg/dL 9.5  10.9  10.0   Total Protein 6.5 - 8.1 g/dL 6.9  7.5    Total Bilirubin  0.3 - 1.2 mg/dL 0.6  0.7    Alkaline Phos 38 - 126 U/L 176  115    AST 15 - 41 U/L 30  30    ALT 0 - 44 U/L 13  13     Lab Results  Component Value Date   CAN153 73.7 (H) 06/18/2021   CAN153 42.0 (H) 02/05/2021   CAN153 36.2 (H) 11/14/2020   Lab Results  Component Value Date   WBC 7.4 08/15/2021   HGB 9.2 (L) 08/15/2021   HCT 30.4 (L) 08/15/2021   MCV 79.4 (L) 08/15/2021   PLT 190 08/15/2021   NEUTROABS 5.7 08/15/2021    ASSESSMENT:  1.  Recurrent breast carcinoma with metastases to bones: -History of right breast cancer in 1987, status post right mastectomy requiring no adjuvant chemo or XRT.  Was not on tamoxifen. -History of left breast cancer in 2007, status post mastectomy by Dr. Carlis Abbott at [facility], placed on tamoxifen, discontinued around 2015. -Recent upper back pain, weight loss of 20 pounds in the last 3 to 6 months. -CT angio on 04/04/2020 showed irregular mass in the left chest wall concerning for recurrent breast cancer measuring 4.5 x 3.1 cm. -Also showed lytic lesions in T1 and T3 vertebral bodies as well as manubrium. -Left chest wall mass biopsy on 04/05/2020 consistent with adenocarcinoma compatible with recurrent breast carcinoma.  ER 95% positive, PR 40% positive, HER2 2+, negative by FISH. - PET scan on 05/15/2020 shows hypermetabolic mass in the left anterior chest wall, mild hypermetabolic thoracic lymphadenopathy, diffuse bone metastasis throughout the spine, sternum, both shoulders, pelvis, proximal right femur which are both lytic and sclerotic. - Anastrozole started around 05/21/2020.  She never started palbociclib even though it was recommended at that time. - Bone scan (07/09/2021): Increased uptake over the right frontal bone, cervical, thoracic and lumbar spine.  Multifocal areas of  increased activity noted throughout the sacrum and pelvis.  Increased activity noted over the sternum.  Increased activity noted over the proximal right humerus and over the lateral aspect of the left scapula.  Focal area of increased activity noted over the proximal right femur. - Took Ibrance 100 mg from 06/28/2021 through 07/04/2021, discontinued secondary to nervousness, nausea and vomiting. - Ibrance 75 mg daily started on 08/15/2021.   2.  Left upper extremity DVT: -Left upper extremity Doppler on 04/04/2020 + for DVT involving left internal jugular vein, left subclavian vein, left axillary vein, left brachial and left basilic vein.   3.  Family history: - No major family history of malignancies other than her brother with throat cancer.   PLAN:  1.  Metastatic breast cancer to the bones, ER/PR positive: - She could not tolerate Ibrance 100 mg dose secondary to vomiting, nausea and nervousness. - I have reviewed MRI of the thoracic and lumbar spine which showed widespread metastatic disease with no spinal canal or paraspinal soft tissues. - She is seen by Dr. Rolena Infante and is being referred for injection. - She has not started Ibrance 75 mg daily due to nervousness. - I have explained possible options including stopping anastrozole and starting Faslodex if she is not willing to take CDK 4/6 inhibitor.  We also discussed option of other CDK 4 inhibitors which can cause diarrhea and cardiac issues.  She is reluctant to consider them. - After prolonged discussion, she is willing to try 75 mg 3 weeks on/1 week off dose. - Recommend follow-up in 2 to 3 weeks to see how she  is tolerating.   2.  Left upper extremity DVT in the setting of malignancy: - Continue Eliquis 5 mg twice daily.   3.  Right sternal/chest wall pain: - Continue tramadol 50 mg 3 times daily.   4.  Bone metastasis: - Continue denosumab monthly.  5.  Left kidney mass: - Last CT showed 2.1 x 1.7 cm exophytic soft tissue lesion  in the left lateral inferior pole of the left kidney.  She did not have CT scan done.  It will be followed on subsequent scans.  Breast Cancer therapy associated bone loss: I have recommended calcium, Vitamin D and weight bearing exercises.  Orders placed this encounter:  No orders of the defined types were placed in this encounter.   The patient has a good understanding of the overall plan. She agrees with it. She will call with any problems that may develop before the next visit here.  Derek Jack, MD [place] [phone]   I, Thana Ates, am acting as a scribe for Dr. Derek Jack.  I, Derek Jack MD, have reviewed the above documentation for accuracy and completeness, and I agree with the above.

## 2021-08-16 LAB — CANCER ANTIGEN 15-3: CA 15-3: 112 U/mL — ABNORMAL HIGH (ref 0.0–25.0)

## 2021-08-16 LAB — CANCER ANTIGEN 27.29: CA 27.29: 231.8 U/mL — ABNORMAL HIGH (ref 0.0–38.6)

## 2021-08-20 ENCOUNTER — Other Ambulatory Visit: Payer: Self-pay | Admitting: *Deleted

## 2021-08-20 ENCOUNTER — Other Ambulatory Visit (HOSPITAL_COMMUNITY): Payer: Self-pay | Admitting: Hematology

## 2021-08-20 DIAGNOSIS — E039 Hypothyroidism, unspecified: Secondary | ICD-10-CM

## 2021-08-20 NOTE — Telephone Encounter (Signed)
Anastrozole refill approved.  Per last ovn, patient is tolerating and is to continue therapy.

## 2021-08-29 ENCOUNTER — Telehealth: Payer: Self-pay | Admitting: Internal Medicine

## 2021-08-29 ENCOUNTER — Ambulatory Visit (HOSPITAL_COMMUNITY): Payer: Medicare PPO

## 2021-08-29 ENCOUNTER — Other Ambulatory Visit (HOSPITAL_COMMUNITY): Payer: Medicare PPO

## 2021-08-29 DIAGNOSIS — Z0279 Encounter for issue of other medical certificate: Secondary | ICD-10-CM

## 2021-08-29 NOTE — Telephone Encounter (Signed)
FMLA   Noted Copied Sleeved

## 2021-09-03 ENCOUNTER — Inpatient Hospital Stay: Payer: Medicare PPO

## 2021-09-03 ENCOUNTER — Inpatient Hospital Stay: Payer: Medicare PPO | Admitting: Hematology

## 2021-09-03 NOTE — Telephone Encounter (Signed)
Patient daughter [name] called will pick up forms.

## 2021-09-04 NOTE — Telephone Encounter (Signed)
Patient daughter picked up forms

## 2021-09-06 DIAGNOSIS — M7061 Trochanteric bursitis, right hip: Secondary | ICD-10-CM | POA: Insufficient documentation

## 2021-09-09 ENCOUNTER — Ambulatory Visit (INDEPENDENT_AMBULATORY_CARE_PROVIDER_SITE_OTHER): Payer: Medicare PPO | Admitting: Internal Medicine

## 2021-09-09 ENCOUNTER — Encounter: Payer: Self-pay | Admitting: Internal Medicine

## 2021-09-09 DIAGNOSIS — J0110 Acute frontal sinusitis, unspecified: Secondary | ICD-10-CM

## 2021-09-09 DIAGNOSIS — N3000 Acute cystitis without hematuria: Secondary | ICD-10-CM | POA: Diagnosis not present

## 2021-09-09 MED ORDER — AMOXICILLIN-POT CLAVULANATE 875-125 MG PO TABS: 1.0000 | ORAL_TABLET | Freq: Two times a day (BID) | ORAL | 0 refills | Status: DC

## 2021-09-09 NOTE — Progress Notes (Signed)
Virtual Visit via Telephone Note   This visit type was conducted due to national recommendations for restrictions regarding the COVID-19 Pandemic (e.g. social distancing) in an effort to limit this patient's exposure and mitigate transmission in our community.  Due to her co-morbid illnesses, this patient is at least at moderate risk for complications without adequate follow up.  This format is felt to be most appropriate for this patient at this time.  The patient did not have access to video technology/had technical difficulties with video requiring transitioning to audio format only (telephone).  All issues noted in this document were discussed and addressed.  No physical exam could be performed with this format.  Evaluation Performed:  Follow-up visit  Date:  09/09/2021   ID:  Robin [name], Robin [name] August 25, 1940, MRN [id number]  Patient Location: Home Provider Location: Office/Clinic  Participants: Patient and daughter - Robin [name] Location of Patient: Home Location of Provider: Telehealth Consent was obtain for visit to be over via telehealth. I verified that I am speaking with the correct person using two identifiers.  PCP:  [name], MD   Chief Complaint: Nasal congestion and sore throat  History of Present Illness:    Robin [name] is a 81 y.o. female who has a televisit for complaint of nasal congestion, postnasal drip, sore throat and cough for the last 4 days.  She denies any fever, chills, dyspnea or wheezing currently.  Her home COVID test was negative.  She also reports dysuria, lower abdominal pain and urinary frequency for the last 1 week.  She has had history of recurrent UTIs in the past.  The patient does not have symptoms concerning for COVID-19 infection (fever, chills, cough, or new shortness of breath).   Past Medical, Surgical, Social History, Allergies, and Medications have been Reviewed.  Past Medical History:  Diagnosis Date   Breast cancer ([facility])     Tamoxifen started 2007   CAD (coronary artery disease)    DES second diagonal 2/12   Cardiomyopathy    Probable Takotsubo, LVEF 30-35% 2/12   Carotid artery disease ([facility])    Essential hypertension    Hepatic steatosis    Hyperlipidemia    Hypothyroidism    NSTEMI (non-ST elevated myocardial infarction) ([facility])    2/12   Pancreas divisum    Stroke ([facility])    (2000) residual left-sided weakness   Type 2 diabetes mellitus ([place])    reports not currently diabetic   Past Surgical History:  Procedure Laterality Date   ABDOMINAL HYSTERECTOMY     CAROTID ENDARTERECTOMY     Left   CHOLECYSTECTOMY     COLONOSCOPY WITH PROPOFOL N/A 05/03/2020   Procedure: COLONOSCOPY WITH PROPOFOL;  Surgeon: Daneil Dolin, MD;  [facility];  Service: Endoscopy;  Laterality: N/A;   ESOPHAGOGASTRODUODENOSCOPY (EGD) WITH PROPOFOL N/A 05/02/2020   Procedure: ESOPHAGOGASTRODUODENOSCOPY (EGD) WITH PROPOFOL;  Surgeon: Rogene Houston, MD;  [facility];  Service: Endoscopy;  Laterality: N/A;   GIVENS CAPSULE STUDY N/A 05/04/2020   Procedure: GIVENS CAPSULE STUDY;  Surgeon: Harvel Quale, MD;  [facility]: [facility];  Service: Gastroenterology;  Laterality: N/A;   JOINT REPLACEMENT     Right knee replacement- [facility]   MASTECTOMY     Bilateral   POLYPECTOMY  05/03/2020   Procedure: POLYPECTOMY;  Surgeon: Daneil Dolin, MD;  [facility]: [facility];  Service: Endoscopy;;     Current Meds  Medication Sig  acetaminophen (TYLENOL) 325 MG tablet Take 2 tablets (650 mg total) by mouth every 6 (six) hours as needed for mild pain, fever or headache (or Fever >/= 101).   ALPRAZolam (XANAX) 0.25 MG tablet Take 1 tablet (0.25 mg total) by mouth 2 (two) times daily as needed for anxiety.   amLODipine (NORVASC) 10 MG tablet Take 1 tablet (10 mg total) by mouth daily.   anastrozole (ARIMIDEX) 1 MG tablet TAKE 1 TABLET BY MOUTH EVERY DAY   apixaban (ELIQUIS) 5 MG TABS  tablet Take 1 tablet (5 mg total) by mouth 2 (two) times daily.   aspirin 81 MG tablet Take 1 tablet (81 mg total) by mouth daily with breakfast.   carvedilol (COREG) 12.5 MG tablet TAKE 1 TABLET (12.'5MG'$  TOTAL) BY MOUTH TWICE A DAY WITH MEALS   cetirizine (ZYRTEC) 10 MG tablet Take 1 tablet (10 mg total) by mouth daily.   Cholecalciferol (VITAMIN D3) 125 MCG (5000 UT) CAPS TAKE 1 CAPSULE BY MOUTH ONCE DAILY (Patient taking differently: Take 1 capsule by mouth daily.)   dicyclomine (BENTYL) 10 MG capsule Take 1 capsule (10 mg total) by mouth 3 (three) times daily as needed for spasms.   ferrous sulfate 325 (65 FE) MG tablet Take 1 tablet (325 mg total) by mouth daily with breakfast.   fluticasone (FLONASE) 50 MCG/ACT nasal spray Place 2 sprays into both nostrils daily.   furosemide (LASIX) 20 MG tablet TAKE 1 TABLET BY MOUTH EVERY DAY AS NEEDED AS DIRECTED   gabapentin (NEURONTIN) 100 MG capsule Take 1 capsule (100 mg total) by mouth at bedtime.   isosorbide mononitrate (IMDUR) 30 MG 24 hr tablet Take 1 tablet (30 mg total) by mouth daily.   KLOR-CON M10 10 MEQ tablet TAKE 1 TABLET BY MOUTH EVERY DAY   levothyroxine (SYNTHROID) 125 MCG tablet TAKE 1 TABLET BY MOUTH DAILY BEFORE BREAKFAST.   methylPREDNISolone (MEDROL DOSEPAK) 4 MG TBPK tablet Use as directed   nitroGLYCERIN (NITROSTAT) 0.4 MG SL tablet Place 1 tablet (0.4 mg total) under the tongue every 5 (five) minutes x 3 doses as needed.   Omega-3 Fatty Acids 300 MG CAPS Take 1 capsule (300 mg total) by mouth 2 (two) times daily.   oxyCODONE (ROXICODONE) 5 MG immediate release tablet Take 1 tablet (5 mg total) by mouth every 6 (six) hours as needed for severe pain.   oxyCODONE-acetaminophen (PERCOCET/ROXICET) 5-325 MG tablet Take 1 tablet by mouth every 6 (six) hours as needed for severe pain.   palbociclib (IBRANCE) 75 MG tablet Take 1 tablet (75 mg total) by mouth daily. Take for 21 days on, 7 days off, repeat every 28 days.   pantoprazole  (PROTONIX) 40 MG tablet Take 1 tablet (40 mg total) by mouth daily.   prochlorperazine (COMPAZINE) 10 MG tablet Take 1 tablet (10 mg total) by mouth every 6 (six) hours as needed for nausea or vomiting.   rosuvastatin (CRESTOR) 40 MG tablet Take 1 tablet (40 mg total) by mouth daily.   traMADol (ULTRAM) 50 MG tablet Take 1 tablet (50 mg total) by mouth every 8 (eight) hours as needed.     Allergies:   Codeine   ROS:   Please see the history of present illness.     All other systems reviewed and are negative.   Labs/Other Tests and Data Reviewed:    Recent Labs: 06/24/2021: TSH 0.694 08/15/2021: ALT 13; BUN 21; Creatinine, Ser 1.12; Hemoglobin 9.2; Magnesium 2.2; Platelets 190; Potassium 3.4; Sodium 139  Recent Lipid Panel Lab Results  Component Value Date/Time   CHOL 145 03/01/2020 10:08 AM   TRIG 109 03/01/2020 10:08 AM   HDL 46 (L) 03/01/2020 10:08 AM   CHOLHDL 3.2 03/01/2020 10:08 AM   LDLCALC 80 03/01/2020 10:08 AM    Wt Readings from Last 3 Encounters:  08/15/21 215 lb 12.8 oz (97.9 kg)  07/09/21 215 lb 8 oz (97.7 kg)  06/25/21 215 lb 8 oz (97.8 kg)     ASSESSMENT & PLAN:    Acute sinusitis Started empiric Augmentin as she has persistent symptoms despite symptomatic treatment Mucinex or Robitussin as needed for cough Nasal saline spray as needed Zyrtec for allergies  UTI Started Augmentin for acute sinusitis, which would also help with UTI Advised to maintain adequate hydration  Time:   Today, I have spent 15 minutes reviewing the chart, including problem list, medications, and with the patient with telehealth technology discussing the above problems.   Medication Adjustments/Labs and Tests Ordered: Current medicines are reviewed at length with the patient today.  Concerns regarding medicines are outlined above.   Tests Ordered: No orders of the defined types were placed in this encounter.   Medication Changes: No orders of the defined types were placed  in this encounter.    Note: This dictation was prepared with Dragon dictation along with smaller phrase technology. Similar sounding words can be transcribed inadequately or may not be corrected upon review. Any transcriptional errors that result from this process are unintentional.      Disposition:  Follow up  Signed, [name], MD  09/09/2021 1:08 PM     [facility]

## 2021-09-25 ENCOUNTER — Inpatient Hospital Stay: Payer: Medicare PPO

## 2021-09-25 ENCOUNTER — Ambulatory Visit (INDEPENDENT_AMBULATORY_CARE_PROVIDER_SITE_OTHER): Payer: Medicare PPO

## 2021-09-25 ENCOUNTER — Inpatient Hospital Stay: Payer: Medicare PPO | Attending: Hematology | Admitting: Hematology

## 2021-09-25 VITALS — BP 174/65 | HR 58 | Temp 98.0°F | Resp 16

## 2021-09-25 DIAGNOSIS — Z7982 Long term (current) use of aspirin: Secondary | ICD-10-CM | POA: Diagnosis not present

## 2021-09-25 DIAGNOSIS — Z0000 Encounter for general adult medical examination without abnormal findings: Secondary | ICD-10-CM | POA: Diagnosis not present

## 2021-09-25 DIAGNOSIS — C7951 Secondary malignant neoplasm of bone: Secondary | ICD-10-CM

## 2021-09-25 DIAGNOSIS — Z79818 Long term (current) use of other agents affecting estrogen receptors and estrogen levels: Secondary | ICD-10-CM | POA: Insufficient documentation

## 2021-09-25 DIAGNOSIS — Z79890 Hormone replacement therapy (postmenopausal): Secondary | ICD-10-CM | POA: Diagnosis not present

## 2021-09-25 DIAGNOSIS — N2889 Other specified disorders of kidney and ureter: Secondary | ICD-10-CM | POA: Insufficient documentation

## 2021-09-25 DIAGNOSIS — Z79811 Long term (current) use of aromatase inhibitors: Secondary | ICD-10-CM | POA: Diagnosis not present

## 2021-09-25 DIAGNOSIS — Z79899 Other long term (current) drug therapy: Secondary | ICD-10-CM | POA: Insufficient documentation

## 2021-09-25 DIAGNOSIS — C50912 Malignant neoplasm of unspecified site of left female breast: Secondary | ICD-10-CM | POA: Diagnosis not present

## 2021-09-25 DIAGNOSIS — Z7901 Long term (current) use of anticoagulants: Secondary | ICD-10-CM | POA: Insufficient documentation

## 2021-09-25 DIAGNOSIS — Z7952 Long term (current) use of systemic steroids: Secondary | ICD-10-CM | POA: Diagnosis not present

## 2021-09-25 LAB — CBC WITH DIFFERENTIAL/PLATELET
Abs Immature Granulocytes: 0.04 10*3/uL (ref 0.00–0.07)
Basophils Absolute: 0 10*3/uL (ref 0.0–0.1)
Basophils Relative: 0 %
Eosinophils Absolute: 0.1 10*3/uL (ref 0.0–0.5)
Eosinophils Relative: 1 %
HCT: 27 % — ABNORMAL LOW (ref 36.0–46.0)
Hemoglobin: 8.2 g/dL — ABNORMAL LOW (ref 12.0–15.0)
Immature Granulocytes: 1 %
Lymphocytes Relative: 15 %
Lymphs Abs: 1.1 10*3/uL (ref 0.7–4.0)
MCH: 23.2 pg — ABNORMAL LOW (ref 26.0–34.0)
MCHC: 30.4 g/dL (ref 30.0–36.0)
MCV: 76.3 fL — ABNORMAL LOW (ref 80.0–100.0)
Monocytes Absolute: 0.7 10*3/uL (ref 0.1–1.0)
Monocytes Relative: 9 %
Neutro Abs: 5.4 10*3/uL (ref 1.7–7.7)
Neutrophils Relative %: 74 %
Platelets: 221 10*3/uL (ref 150–400)
RBC: 3.54 MIL/uL — ABNORMAL LOW (ref 3.87–5.11)
RDW: 17.7 % — ABNORMAL HIGH (ref 11.5–15.5)
WBC: 7.4 10*3/uL (ref 4.0–10.5)
nRBC: 0 % (ref 0.0–0.2)

## 2021-09-25 LAB — COMPREHENSIVE METABOLIC PANEL
ALT: 15 U/L (ref 0–44)
AST: 35 U/L (ref 15–41)
Albumin: 3.8 g/dL (ref 3.5–5.0)
Alkaline Phosphatase: 188 U/L — ABNORMAL HIGH (ref 38–126)
Anion gap: 8 (ref 5–15)
BUN: 24 mg/dL — ABNORMAL HIGH (ref 8–23)
CO2: 20 mmol/L — ABNORMAL LOW (ref 22–32)
Calcium: 9.5 mg/dL (ref 8.9–10.3)
Chloride: 110 mmol/L (ref 98–111)
Creatinine, Ser: 1.11 mg/dL — ABNORMAL HIGH (ref 0.44–1.00)
GFR, Estimated: 49 mL/min — ABNORMAL LOW (ref >60)
Glucose, Bld: 98 mg/dL (ref 70–99)
Potassium: 3.7 mmol/L (ref 3.5–5.1)
Sodium: 138 mmol/L (ref 135–145)
Total Bilirubin: 0.7 mg/dL (ref 0.3–1.2)
Total Protein: 7.2 g/dL (ref 6.5–8.1)

## 2021-09-25 LAB — MAGNESIUM: Magnesium: 2.3 mg/dL (ref 1.7–2.4)

## 2021-09-25 MED ORDER — DENOSUMAB 120 MG/1.7ML ~~LOC~~ SOLN
120.0000 mg | Freq: Once | SUBCUTANEOUS | Status: AC
  Administered 2021-09-25: 120 mg via SUBCUTANEOUS
  Filled 2021-09-25: qty 1.7

## 2021-09-25 NOTE — Progress Notes (Signed)
Xgeva injectio given per orders. Patient tolerated it well without problems. Vitals stable and discharged home from clinic via wheelchair. Follow up as scheduled.

## 2021-09-25 NOTE — Progress Notes (Signed)
[facility] [address]  [address]   Patient Care Team: [name], MD as PCP - General (Internal Medicine) Satira Sark, MD as PCP - Cardiology (Cardiology) Brien Mates, RN as Oncology Nurse Navigator (Oncology) Derek Jack, MD as Medical Oncologist (Medical Oncology)  CHIEF COMPLIANT: Follow-up of left breast cancer with metastases to bones   INTERVAL HISTORY: [name] is a 81 y.o. female seen for follow-up of breast cancer metastatic to the bones.  She is accompanied by her daughter today.  She is taking anastrozole daily without fail.  However she has not started taking Ibrance yet as she reportedly misplaced and could not find the bottle.  She has obtained another bottle from the pharmacy in the last couple of days.  She reported resolution of back pain after she got injection.  REVIEW OF SYSTEMS:   Review of Systems  Constitutional:  Negative for appetite change and unexpected weight change.  Gastrointestinal:  Positive for constipation.  Musculoskeletal:  Negative for back pain.  All other systems reviewed and are negative.   I have reviewed the past medical history, past surgical history, social history and family history with the patient and they are unchanged from previous note.   ALLERGIES:   is allergic to codeine.   MEDICATIONS:  Current Outpatient Medications  Medication Sig Dispense Refill   acetaminophen (TYLENOL) 325 MG tablet Take 2 tablets (650 mg total) by mouth every 6 (six) hours as needed for mild pain, fever or headache (or Fever >/= 101). 12 tablet 0   ALPRAZolam (XANAX) 0.25 MG tablet Take 1 tablet (0.25 mg total) by mouth 2 (two) times daily as needed for anxiety. 60 tablet 0   amLODipine (NORVASC) 10 MG tablet Take 1 tablet (10 mg total) by mouth daily. 90 tablet 3   amoxicillin-clavulanate (AUGMENTIN) 875-125 MG tablet Take 1 tablet by mouth 2 (two) times daily. 14 tablet 0    anastrozole (ARIMIDEX) 1 MG tablet TAKE 1 TABLET BY MOUTH EVERY DAY 30 tablet 3   apixaban (ELIQUIS) 5 MG TABS tablet Take 1 tablet (5 mg total) by mouth 2 (two) times daily. 60 tablet 3   aspirin 81 MG tablet Take 1 tablet (81 mg total) by mouth daily with breakfast. 30 tablet 3   carvedilol (COREG) 12.5 MG tablet TAKE 1 TABLET (12.5MG TOTAL) BY MOUTH TWICE A DAY WITH MEALS 90 tablet 3   cetirizine (ZYRTEC) 10 MG tablet Take 1 tablet (10 mg total) by mouth daily. 30 tablet 0   Cholecalciferol (VITAMIN D3) 125 MCG (5000 UT) CAPS TAKE 1 CAPSULE BY MOUTH ONCE DAILY (Patient taking differently: Take 1 capsule by mouth daily.) 90 capsule 1   dicyclomine (BENTYL) 10 MG capsule Take 1 capsule (10 mg total) by mouth 3 (three) times daily as needed for spasms. 20 capsule 0   ferrous sulfate 325 (65 FE) MG tablet Take 1 tablet (325 mg total) by mouth daily with breakfast. 30 tablet 0   fluticasone (FLONASE) 50 MCG/ACT nasal spray Place 2 sprays into both nostrils daily. 16 g 1   furosemide (LASIX) 20 MG tablet TAKE 1 TABLET BY MOUTH EVERY DAY AS NEEDED AS DIRECTED 30 tablet 1   gabapentin (NEURONTIN) 100 MG capsule Take 1 capsule (100 mg total) by mouth at bedtime. 90 capsule 3   isosorbide mononitrate (IMDUR) 30 MG 24 hr tablet Take 1 tablet (30 mg total) by mouth daily. 90 tablet 2   KLOR-CON  M10 10 MEQ tablet TAKE 1 TABLET BY MOUTH EVERY DAY 90 tablet 1   levothyroxine (SYNTHROID) 125 MCG tablet TAKE 1 TABLET BY MOUTH DAILY BEFORE BREAKFAST. 30 tablet 3   methylPREDNISolone (MEDROL DOSEPAK) 4 MG TBPK tablet Use as directed 21 each 0   nitroGLYCERIN (NITROSTAT) 0.4 MG SL tablet Place 1 tablet (0.4 mg total) under the tongue every 5 (five) minutes x 3 doses as needed. 25 tablet 3   Omega-3 Fatty Acids 300 MG CAPS Take 1 capsule (300 mg total) by mouth 2 (two) times daily.     oxyCODONE (ROXICODONE) 5 MG immediate release tablet Take 1 tablet (5 mg total) by mouth every 6 (six) hours as needed for severe  pain. 10 tablet 0   oxyCODONE-acetaminophen (PERCOCET/ROXICET) 5-325 MG tablet Take 1 tablet by mouth every 6 (six) hours as needed for severe pain. 15 tablet 0   palbociclib (IBRANCE) 75 MG tablet Take 1 tablet (75 mg total) by mouth daily. Take for 21 days on, 7 days off, repeat every 28 days. 21 tablet 3   pantoprazole (PROTONIX) 40 MG tablet Take 1 tablet (40 mg total) by mouth daily. 90 tablet 1   prochlorperazine (COMPAZINE) 10 MG tablet Take 1 tablet (10 mg total) by mouth every 6 (six) hours as needed for nausea or vomiting. 60 tablet 4   rosuvastatin (CRESTOR) 40 MG tablet Take 1 tablet (40 mg total) by mouth daily. 90 tablet 2   traMADol (ULTRAM) 50 MG tablet Take 1 tablet (50 mg total) by mouth every 8 (eight) hours as needed. 90 tablet 0   No current facility-administered medications for this visit.     PHYSICAL EXAMINATION: Performance status (ECOG): 2 - Symptomatic, <50% confined to bed  There were no vitals filed for this visit. Wt Readings from Last 3 Encounters:  08/15/21 215 lb 12.8 oz (97.9 kg)  07/09/21 215 lb 8 oz (97.7 kg)  06/25/21 215 lb 8 oz (97.8 kg)   Physical Exam Vitals reviewed.  Constitutional:      Appearance: Normal appearance. She is obese.     Comments: In wheelchair  Cardiovascular:     Rate and Rhythm: Normal rate and regular rhythm.     Pulses: Normal pulses.     Heart sounds: Normal heart sounds.  Pulmonary:     Effort: Pulmonary effort is normal.     Breath sounds: Normal breath sounds.  Neurological:     General: No focal deficit present.     Mental Status: She is alert and oriented to person, place, and time.  Psychiatric:        Mood and Affect: Mood normal.        Behavior: Behavior normal.    Breast Exam Chaperone: Thana Ates     LABORATORY DATA:  I have reviewed the data as listed    Latest Ref Rng & Units 08/15/2021   10:24 AM 07/25/2021    1:07 PM 06/21/2021   10:57 AM  CMP  Glucose 70 - 99 mg/dL 107  100  104   BUN 8  - 23 mg/dL _0 Creatinine 0.44 - 1.00 mg/dL 1.12  1.11  1.30   Sodium 135 - 145 mmol/L 139  142  140   Potassium 3.5 - 5.1 mmol/L 3.4  3.5  3.6   Chloride 98 - 111 mmol/L 108  108  106   CO2 22 - 32 mmol/L _1 Calcium 8.9 -  10.3 mg/dL 9.5  10.9  10.0   Total Protein 6.5 - 8.1 g/dL 6.9  7.5    Total Bilirubin 0.3 - 1.2 mg/dL 0.6  0.7    Alkaline Phos 38 - 126 U/L 176  115    AST 15 - 41 U/L 30  30    ALT 0 - 44 U/L 13  13     Lab Results  Component Value Date   CAN153 112.0 (H) 08/15/2021   CAN153 73.7 (H) 06/18/2021   CAN153 42.0 (H) 02/05/2021   Lab Results  Component Value Date   WBC 7.4 09/25/2021   HGB 8.2 (L) 09/25/2021   HCT 27.0 (L) 09/25/2021   MCV 76.3 (L) 09/25/2021   PLT 221 09/25/2021   NEUTROABS 5.4 09/25/2021    ASSESSMENT:  1.  Recurrent breast carcinoma with metastases to bones: -History of right breast cancer in 1987, status post right mastectomy requiring no adjuvant chemo or XRT.  Was not on tamoxifen. -History of left breast cancer in 2007, status post mastectomy by Dr. Carlis Abbott at [facility], placed on tamoxifen, discontinued around 2015. -Recent upper back pain, weight loss of 20 pounds in the last 3 to 6 months. -CT angio on 04/04/2020 showed irregular mass in the left chest wall concerning for recurrent breast cancer measuring 4.5 x 3.1 cm. -Also showed lytic lesions in T1 and T3 vertebral bodies as well as manubrium. -Left chest wall mass biopsy on 04/05/2020 consistent with adenocarcinoma compatible with recurrent breast carcinoma.  ER 95% positive, PR 40% positive, HER2 2+, negative by FISH. - PET scan on 05/15/2020 shows hypermetabolic mass in the left anterior chest wall, mild hypermetabolic thoracic lymphadenopathy, diffuse bone metastasis throughout the spine, sternum, both shoulders, pelvis, proximal right femur which are both lytic and sclerotic. - Anastrozole started around 05/21/2020.  She never started palbociclib even  though it was recommended at that time. - Bone scan (07/09/2021): Increased uptake over the right frontal bone, cervical, thoracic and lumbar spine.  Multifocal areas of increased activity noted throughout the sacrum and pelvis.  Increased activity noted over the sternum.  Increased activity noted over the proximal right humerus and over the lateral aspect of the left scapula.  Focal area of increased activity noted over the proximal right femur. - Took Ibrance 100 mg from 06/28/2021 through 07/04/2021, discontinued secondary to nervousness, nausea and vomiting. - Ibrance 75 mg daily started on 08/15/2021.   2.  Left upper extremity DVT: -Left upper extremity Doppler on 04/04/2020 + for DVT involving left internal jugular vein, left subclavian vein, left axillary vein, left brachial and left basilic vein.   3.  Family history: - No major family history of malignancies other than her brother with throat cancer.   PLAN:  1.  Metastatic breast cancer to the bones, ER/PR positive: - She could not tolerate Ibrance 100 mg dose secondary to vomiting, nausea and nervousness. - She has not started Ibrance 75 mg due to misplacement of the bottle. - I have reassured her that she will be able to tolerate low-dose well.  We discussed side effects in detail. - Reviewed labs today which shows elevated alk phos, slightly up from last time.  She also had hemoglobin 8.2 with MCV 76. - I have recommended her to start taking iron tablet.  I will check ferritin and iron panel at next visit.  If it does not improve, consider parenteral iron therapy. - Strongly recommended starting Ibrance 75 mg daily 3 weeks on 1  week off.  We will see her back in 4 weeks with repeat CBC and toxicity assessment.   2.  Left upper extremity DVT in the setting of malignancy: - Continue Eliquis 5 mg twice daily.   3.  Right sternal/chest wall pain: -Continue tramadol 50 mg 3 times daily.   4.  Bone metastasis: - Can continue denosumab  monthly.  Calcium today is normal.  5.  Left kidney mass: - Last CT scan showed 2.1 x 1.7 cm exophytic soft tissue lesion in the left lateral inferior pole of the left kidney.  It will be followed on subsequent scans.  Breast Cancer therapy associated bone loss: I have recommended calcium, Vitamin D and weight bearing exercises.  Orders placed this encounter:  No orders of the defined types were placed in this encounter.   The patient has a good understanding of the overall plan. She agrees with it. She will call with any problems that may develop before the next visit here.  Derek Jack, MD [place] [phone]

## 2021-09-25 NOTE — Patient Instructions (Addendum)
  [name] , Thank you for taking time to come for your Medicare Wellness Visit. I appreciate your ongoing commitment to your health goals. Please review the following plan we discussed and let me know if I can assist you in the future.   These are the goals we discussed:  Goals      Prevent falls        This is a list of the screening recommended for you and due dates:  Health Maintenance  Topic Date Due   Zoster (Shingles) Vaccine (1 of 2) Never done   Pneumonia Vaccine (2 - PPSV23 or PCV20) 12/01/2013   Tetanus Vaccine  11/07/2020   Flu Shot  08/20/2021   DEXA scan (bone density measurement)  Completed   HPV Vaccine  Aged Out   COVID-19 Vaccine  Discontinued

## 2021-09-25 NOTE — Progress Notes (Signed)
I connected with  Robin [name] on 09/25/21 by a audio enabled telemedicine application and verified that I am speaking with the correct person using two identifiers.  Patient Location: Home  Provider Location: Office/Clinic  I discussed the limitations of evaluation and management by telemedicine. The patient expressed understanding and agreed to proceed.  Subjective:   Robin [name] is a 81 y.o. female who presents for Medicare Annual (Subsequent) preventive examination.  Review of Systems     Cardiac Risk Factors include: advanced age (>49mn, >>77women);dyslipidemia;hypertension;sedentary lifestyle;smoking/ tobacco exposure     Objective:    There were no vitals filed for this visit. There is no height or weight on file to calculate BMI.     09/25/2021    8:54 AM 08/15/2021   10:59 AM 08/01/2021    2:26 PM 07/25/2021    1:43 PM 07/09/2021    5:46 AM 06/25/2021   11:09 AM 06/21/2021    9:59 AM  Advanced Directives  Does Patient Have a Medical Advance Directive? No No No No No No No  Would patient like information on creating a medical advance directive? Yes (ED - Information included in AVS) No - Patient declined No - Patient declined No - Patient declined  No - Patient declined Yes (ED - Information included in AVS)    Current Medications (verified) Outpatient Encounter Medications as of 09/25/2021  Medication Sig   acetaminophen (TYLENOL) 325 MG tablet Take 2 tablets (650 mg total) by mouth every 6 (six) hours as needed for mild pain, fever or headache (or Fever >/= 101).   ALPRAZolam (XANAX) 0.25 MG tablet Take 1 tablet (0.25 mg total) by mouth 2 (two) times daily as needed for anxiety.   amLODipine (NORVASC) 10 MG tablet Take 1 tablet (10 mg total) by mouth daily.   amoxicillin-clavulanate (AUGMENTIN) 875-125 MG tablet Take 1 tablet by mouth 2 (two) times daily.   anastrozole (ARIMIDEX) 1 MG tablet TAKE 1 TABLET BY MOUTH EVERY DAY   apixaban (ELIQUIS) 5 MG TABS tablet Take  1 tablet (5 mg total) by mouth 2 (two) times daily.   aspirin 81 MG tablet Take 1 tablet (81 mg total) by mouth daily with breakfast.   carvedilol (COREG) 12.5 MG tablet TAKE 1 TABLET (12.'5MG'$  TOTAL) BY MOUTH TWICE A DAY WITH MEALS   cetirizine (ZYRTEC) 10 MG tablet Take 1 tablet (10 mg total) by mouth daily.   Cholecalciferol (VITAMIN D3) 125 MCG (5000 UT) CAPS TAKE 1 CAPSULE BY MOUTH ONCE DAILY (Patient taking differently: Take 1 capsule by mouth daily.)   dicyclomine (BENTYL) 10 MG capsule Take 1 capsule (10 mg total) by mouth 3 (three) times daily as needed for spasms.   ferrous sulfate 325 (65 FE) MG tablet Take 1 tablet (325 mg total) by mouth daily with breakfast.   fluticasone (FLONASE) 50 MCG/ACT nasal spray Place 2 sprays into both nostrils daily.   furosemide (LASIX) 20 MG tablet TAKE 1 TABLET BY MOUTH EVERY DAY AS NEEDED AS DIRECTED   gabapentin (NEURONTIN) 100 MG capsule Take 1 capsule (100 mg total) by mouth at bedtime.   isosorbide mononitrate (IMDUR) 30 MG 24 hr tablet Take 1 tablet (30 mg total) by mouth daily.   KLOR-CON M10 10 MEQ tablet TAKE 1 TABLET BY MOUTH EVERY DAY   levothyroxine (SYNTHROID) 125 MCG tablet TAKE 1 TABLET BY MOUTH DAILY BEFORE BREAKFAST.   methylPREDNISolone (MEDROL DOSEPAK) 4 MG TBPK tablet Use as directed   nitroGLYCERIN (NITROSTAT) 0.4 MG SL  tablet Place 1 tablet (0.4 mg total) under the tongue every 5 (five) minutes x 3 doses as needed.   Omega-3 Fatty Acids 300 MG CAPS Take 1 capsule (300 mg total) by mouth 2 (two) times daily.   oxyCODONE (ROXICODONE) 5 MG immediate release tablet Take 1 tablet (5 mg total) by mouth every 6 (six) hours as needed for severe pain.   oxyCODONE-acetaminophen (PERCOCET/ROXICET) 5-325 MG tablet Take 1 tablet by mouth every 6 (six) hours as needed for severe pain.   palbociclib (IBRANCE) 75 MG tablet Take 1 tablet (75 mg total) by mouth daily. Take for 21 days on, 7 days off, repeat every 28 days.   pantoprazole (PROTONIX) 40  MG tablet Take 1 tablet (40 mg total) by mouth daily.   prochlorperazine (COMPAZINE) 10 MG tablet Take 1 tablet (10 mg total) by mouth every 6 (six) hours as needed for nausea or vomiting.   rosuvastatin (CRESTOR) 40 MG tablet Take 1 tablet (40 mg total) by mouth daily.   traMADol (ULTRAM) 50 MG tablet Take 1 tablet (50 mg total) by mouth every 8 (eight) hours as needed.   No facility-administered encounter medications on file as of 09/25/2021.    Allergies (verified) Codeine   History: Past Medical History:  Diagnosis Date   Breast cancer ([place])    Tamoxifen started 2007   CAD (coronary artery disease)    DES second diagonal 2/12   Cardiomyopathy    Probable Takotsubo, LVEF 30-35% 2/12   Carotid artery disease ([facility])    Essential hypertension    Hepatic steatosis    Hyperlipidemia    Hypothyroidism    NSTEMI (non-ST elevated myocardial infarction) ([place])    2/12   Pancreas divisum    Stroke ([place])    (2000) residual left-sided weakness   Type 2 diabetes mellitus ([place])    reports not currently diabetic   Past Surgical History:  Procedure Laterality Date   ABDOMINAL HYSTERECTOMY     CAROTID ENDARTERECTOMY     Left   CHOLECYSTECTOMY     COLONOSCOPY WITH PROPOFOL N/A 05/03/2020   Procedure: COLONOSCOPY WITH PROPOFOL;  Surgeon: Daneil Dolin, MD;  [facility];  Service: Endoscopy;  Laterality: N/A;   ESOPHAGOGASTRODUODENOSCOPY (EGD) WITH PROPOFOL N/A 05/02/2020   Procedure: ESOPHAGOGASTRODUODENOSCOPY (EGD) WITH PROPOFOL;  Surgeon: Rogene Houston, MD;  [facility];  Service: Endoscopy;  Laterality: N/A;   GIVENS CAPSULE STUDY N/A 05/04/2020   Procedure: GIVENS CAPSULE STUDY;  Surgeon: Harvel Quale, MD;  [facility];  Service: Gastroenterology;  Laterality: N/A;   JOINT REPLACEMENT     Right knee [facility]- [facility]   MASTECTOMY     Bilateral   POLYPECTOMY  05/03/2020   Procedure: POLYPECTOMY;  Surgeon: Daneil Dolin, MD;  [facility];  Service: Endoscopy;;   Family History  Problem Relation Age of Onset   Heart attack Sister    Heart disease Sister    Heart attack Brother    Heart disease Brother    Throat cancer Brother    Colon cancer Neg Hx    Social History   Socioeconomic History   Marital status: Widowed    Spouse name: Not on file   Number of children: Not on file   Years of education: Not on file   Highest education level: Not on file  Occupational History   Not on file  Tobacco Use   Smoking status: Never   Smokeless  tobacco: Never  Vaping Use   Vaping Use: Never used  Substance and Sexual Activity   Alcohol use: No    Alcohol/week: 0.0 standard drinks of alcohol   Drug use: No   Sexual activity: Not on file  Other Topics Concern   Not on file  Social History Narrative   Not on file   Social Determinants of Health   Financial Resource Strain: Low Risk  (09/25/2021)   Overall Financial Resource Strain (CARDIA)    Difficulty of Paying Living Expenses: Not hard at all  Food Insecurity: No Food Insecurity (09/25/2021)   Hunger Vital Sign    Worried About Running Out of Food in the Last Year: Never true    Ran Out of Food in the Last Year: Never true  Transportation Needs: No Transportation Needs (09/25/2021)   PRAPARE - Hydrologist (Medical): No    Lack of Transportation (Non-Medical): No  Physical Activity: Insufficiently Active (09/25/2021)   Exercise Vital Sign    Days of Exercise per Week: 3 days    Minutes of Exercise per Session: 30 min  Stress: No Stress Concern Present (09/22/2020)   [place]    Feeling of Stress : Not at all  Social Connections: Moderately Isolated (09/22/2020)   Social Connection and Isolation Panel [NHANES]    Frequency of Communication with Friends and Family: More than three times a week    Frequency of Social Gatherings with  Friends and Family: More than three times a week    Attends Religious Services: More than 4 times per year    Active Member of [employer] or Organizations: No    Attends Archivist Meetings: Never    Marital Status: Widowed    Tobacco Counseling Counseling given: Not Answered   Clinical Intake:  Pre-visit preparation completed: Yes  Pain : No/denies pain     Nutritional Status: BMI > 30  Obese Diabetes: No     Diabetic?no         Activities of Daily Living    09/25/2021    9:13 AM  In your present state of health, do you have any difficulty performing the following activities:  Hearing? 0  Vision? 0  Difficulty concentrating or making decisions? 0  Walking or climbing stairs? 0  Dressing or bathing? 0  Doing errands, shopping? 1  Preparing Food and eating ? N  Using the Toilet? N  In the past six months, have you accidently leaked urine? N  Do you have problems with loss of bowel control? N  Managing your Medications? N  Managing your Finances? N  Housekeeping or managing your Housekeeping? N    Patient Care Team: [name], MD as PCP - General (Internal Medicine) Satira Sark, MD as PCP - Cardiology (Cardiology) Brien Mates, RN as Oncology Nurse Navigator (Oncology) Derek Jack, MD as Medical Oncologist (Medical Oncology)  Indicate any recent Medical Services you may have received from other than [facility] providers in the past year (date may be approximate).     Assessment:   This is a routine wellness examination for [facility].  Hearing/Vision screen No results found.  Dietary issues and exercise activities discussed:     Goals Addressed             This Visit's Progress    COMPLETED: Patient Stated       Just get better from cancer treatments  Prevent falls         Depression Screen    09/09/2021   11:44 AM 06/24/2021    4:13 PM 05/07/2021    1:51 PM 01/30/2021   10:53 AM 11/27/2020    8:36 AM 11/05/2020     3:07 PM 09/22/2020    9:08 AM  PHQ 2/9 Scores  PHQ - 2 Score 0 0 0 0 0 0 0    Fall Risk    09/25/2021    9:11 AM 09/09/2021   11:44 AM 06/24/2021    4:13 PM 05/07/2021    1:51 PM 01/30/2021   10:53 AM  [place] in the past year? 0 0 0 0 0  Number falls in past yr: 0 0 0 0 0  Injury with Fall? 0 0 0 0 0  Risk for fall due to :  No Fall Risks No Fall Risks No Fall Risks No Fall Risks  Follow up  Falls evaluation completed Falls evaluation completed Falls evaluation completed Falls evaluation completed    [place]:  Any stairs in or around the home? No  If so, are there any without handrails? No  Home free of loose throw rugs in walkways, pet beds, electrical cords, etc? Yes  Adequate lighting in your home to reduce risk of falls? Yes   ASSISTIVE DEVICES UTILIZED TO PREVENT FALLS:  Life alert? No  Use of a cane, walker or w/c? Yes  Grab bars in the bathroom? Yes  Shower chair or bench in shower? Yes  Elevated toilet seat or a handicapped toilet? Yes    Cognitive Function:    09/22/2020    9:10 AM  MMSE - Mini Mental State Exam  Not completed: Unable to complete        09/25/2021    9:14 AM 09/22/2020    9:10 AM  6CIT Screen  What Year? 0 points 0 points  What month? 0 points 0 points  What time? 0 points 0 points  Count back from 20 0 points 0 points  Months in reverse 4 points 0 points  Repeat phrase 4 points 0 points  Total Score 8 points 0 points    Immunizations Immunization History  Administered Date(s) Administered   Fluad Quad(high Dose 65+) 10/12/2018, 11/04/2019, 11/05/2020   Influenza, High Dose Seasonal PF 11/17/2016, 12/28/2017   Influenza,inj,Quad PF,6+ Mos 10/31/2014, 11/20/2015   Moderna Sars-Covid-2 Vaccination 06/10/2019, 07/08/2019   Pneumococcal Conjugate-13 12/01/2012    TDAP status: Due, Education has been provided regarding the importance of this vaccine. Advised may receive this vaccine at local  pharmacy or Health Dept. Aware to provide a copy of the vaccination record if obtained from local pharmacy or Health Dept. Verbalized acceptance and understanding.  Flu Vaccine status: Due, Education has been provided regarding the importance of this vaccine. Advised may receive this vaccine at local pharmacy or Health Dept. Aware to provide a copy of the vaccination record if obtained from local pharmacy or Health Dept. Verbalized acceptance and understanding.  Pneumococcal vaccine status: Due, Education has been provided regarding the importance of this vaccine. Advised may receive this vaccine at local pharmacy or Health Dept. Aware to provide a copy of the vaccination record if obtained from local pharmacy or Health Dept. Verbalized acceptance and understanding.  Covid-19 vaccine status: Declined, Education has been provided regarding the importance of this vaccine but patient still declined. Advised may receive this vaccine at local pharmacy or Health Dept.or  vaccine clinic. Aware to provide a copy of the vaccination record if obtained from local pharmacy or Health Dept. Verbalized acceptance and understanding.  Qualifies for Shingles Vaccine? Yes   Zostavax completed No   Shingrix Completed?: No.    Education has been provided regarding the importance of this vaccine. Patient has been advised to call insurance company to determine out of pocket expense if they have not yet received this vaccine. Advised may also receive vaccine at local pharmacy or Health Dept. Verbalized acceptance and understanding.  Screening Tests Health Maintenance  Topic Date Due   Zoster Vaccines- Shingrix (1 of 2) Never done   Pneumonia Vaccine 49+ Years old (2 - PPSV23 or PCV20) 12/01/2013   TETANUS/TDAP  11/07/2020   INFLUENZA VACCINE  08/20/2021   DEXA SCAN  Completed   HPV VACCINES  Aged Out   COVID-19 Vaccine  Discontinued    Health Maintenance  Health Maintenance Due  Topic Date Due   Zoster Vaccines-  Shingrix (1 of 2) Never done   Pneumonia Vaccine 10+ Years old (2 - PPSV23 or PCV20) 12/01/2013   TETANUS/TDAP  11/07/2020   INFLUENZA VACCINE  08/20/2021    Colorectal cancer screening: Type of screening: Colonoscopy. Completed 2022. Repeat every 10 years no further testing needed   Mammogram status: No longer required due to age .  Bone Density status: Completed 2017. Results reflect: Bone density results: OSTEOPENIA. Repeat every 2 years. No longer needed   Lung Cancer Screening: (Low Dose CT Chest recommended if Age 72-80 years, 30 pack-year currently smoking OR have quit w/in 15years.) does not qualify.   Lung Cancer Screening Referral: na  Additional Screening:  Hepatitis C Screening: does not qualify; Completed   Vision Screening: Recommended annual ophthalmology exams for early detection of glaucoma and other disorders of the eye. Is the patient up to date with their annual eye exam?  Yes  Who is the provider or what is the name of the office in which the patient attends annual eye exams? Dr Kris Mouton in [place]  If pt is not established with a provider, would they like to be referred to a provider to establish care? No .   Dental Screening: Recommended annual dental exams for proper oral hygiene  Community Resource Referral / Chronic Care Management: CRR required this visit?  No   CCM required this visit?  No      Plan:     I have personally reviewed and noted the following in the patient's chart:   Medical and social history Use of alcohol, tobacco or illicit drugs  Current medications and supplements including opioid prescriptions. Patient is not currently taking opioid prescriptions. Functional ability and status Nutritional status Physical activity Advanced directives List of other physicians Hospitalizations, surgeries, and ER visits in previous 12 months Vitals Screenings to include cognitive, depression, and falls Referrals and appointments  In  addition, I have reviewed and discussed with patient certain preventive protocols, quality metrics, and best practice recommendations. A written personalized care plan for preventive services as well as general preventive health recommendations were provided to patient.     Eual Fines, LPN   02/24/537   Nurse Notes: Schedule your next wellness visit for 1 year at checkout

## 2021-09-25 NOTE — Patient Instructions (Signed)
[place] at [facility] Discharge Instructions   You were seen and examined today by Dr. Delton Coombes.  He reviewed your lab work - your hemoglobin is low and is steadily going down. The last time we checked your iron it was low. You could benefit from taking an iron tablet daily. Take iron 325 mg daily.  Start the [place] pill today and take as prescribed.   We will see you back in 4 weeks. We will repeat blood work then.      Thank you for choosing [place] at [facility] to provide your oncology and hematology care.  To afford each patient quality time with our provider, please arrive at least 15 minutes before your scheduled appointment time.   If you have a lab appointment with the [facility] please come in thru the Main Entrance and check in at the main information desk.  You need to re-schedule your appointment should you arrive 10 or more minutes late.  We strive to give you quality time with our providers, and arriving late affects you and other patients whose appointments are after yours.  Also, if you no show three or more times for appointments you may be dismissed from the clinic at the providers discretion.     Again, thank you for choosing [facility].  Our hope is that these requests will decrease the amount of time that you wait before being seen by our physicians.       _____________________________________________________________  Should you have questions after your visit to [facility], please contact our office at [phone] and follow the prompts.  Our office hours are 8:00 a.m. and 4:30 p.m. Monday - Friday.  Please note that voicemails left after 4:00 p.m. may not be returned until the following business day.  We are closed weekends and major holidays.  You do have access to a nurse 24-7, just call the main number to the clinic [phone] and do not press any options, hold on the line  and a nurse will answer the phone.    For prescription refill requests, have your pharmacy contact our office and allow 72 hours.    Due to Covid, you will need to wear a mask upon entering the hospital. If you do not have a mask, a mask will be given to you at the Main Entrance upon arrival. For doctor visits, patients may have 1 support person age 37 or older with them. For treatment visits, patients can not have anyone with them due to social distancing guidelines and our immunocompromised population.

## 2021-09-30 ENCOUNTER — Other Ambulatory Visit: Payer: Self-pay | Admitting: *Deleted

## 2021-09-30 DIAGNOSIS — C7951 Secondary malignant neoplasm of bone: Secondary | ICD-10-CM

## 2021-09-30 DIAGNOSIS — D508 Other iron deficiency anemias: Secondary | ICD-10-CM

## 2021-10-22 ENCOUNTER — Other Ambulatory Visit: Payer: Self-pay | Admitting: *Deleted

## 2021-10-22 MED ORDER — PALBOCICLIB 75 MG PO TABS: 75.0000 mg | ORAL_TABLET | Freq: Every day | ORAL | 3 refills | Status: DC

## 2021-10-24 ENCOUNTER — Ambulatory Visit (INDEPENDENT_AMBULATORY_CARE_PROVIDER_SITE_OTHER): Payer: Medicare PPO | Admitting: Internal Medicine

## 2021-10-24 ENCOUNTER — Encounter: Payer: Self-pay | Admitting: Internal Medicine

## 2021-10-24 VITALS — BP 128/82 | HR 63 | Resp 18 | Ht 66.0 in | Wt 201.6 lb

## 2021-10-24 DIAGNOSIS — N1831 Chronic kidney disease, stage 3a: Secondary | ICD-10-CM | POA: Diagnosis not present

## 2021-10-24 DIAGNOSIS — D500 Iron deficiency anemia secondary to blood loss (chronic): Secondary | ICD-10-CM | POA: Diagnosis not present

## 2021-10-24 DIAGNOSIS — Z23 Encounter for immunization: Secondary | ICD-10-CM

## 2021-10-24 DIAGNOSIS — E039 Hypothyroidism, unspecified: Secondary | ICD-10-CM

## 2021-10-24 DIAGNOSIS — I10 Essential (primary) hypertension: Secondary | ICD-10-CM

## 2021-10-24 DIAGNOSIS — C50912 Malignant neoplasm of unspecified site of left female breast: Secondary | ICD-10-CM

## 2021-10-24 NOTE — Assessment & Plan Note (Addendum)
BP Readings from Last 1 Encounters:  10/24/21 128/82   Well-controlled with amlodipine, Coreg, Imdur and Lasix Counseled for compliance with the medications Advised DASH diet and walking as tolerated

## 2021-10-24 NOTE — Assessment & Plan Note (Signed)
Lab Results  Component Value Date   TSH 0.694 06/24/2021   On Levothyroxine 125 mcg QD Followed by endocrinology

## 2021-10-24 NOTE — Assessment & Plan Note (Signed)
Last BMP reviewed, stable GFR Avoid nephrotoxic agents On Lasix for LE edema 

## 2021-10-24 NOTE — Assessment & Plan Note (Signed)
CT chest showed recurrence of breast ca. On Anastrozole and Ibrance F/u with Heme/Onc. 

## 2021-10-24 NOTE — Progress Notes (Signed)
Established Patient Office Visit  Subjective:  Patient ID: Robin [name], female    DOB: Oct 05, 1940  Age: 81 y.o. MRN: [id number]  CC:  Chief Complaint  Patient presents with   Follow-up    4 month follow up CKD and HTN     HPI Robin [name] is a 81 y.o. female with past medical history of HTN, CAD s/p stent placement, carotid artery occlusion s/p carotid endarterectomy, hypothyroidism, generalized OA and GERD who presents for f/u of her chronic medical conditions.  She is on Ibrance and anastrozole for recurrence of breast cancer.  She has chronic UE lymphedema, but is stable currently.  Her blood tests showed stable CKD.  She is going to see Dr. Delton Coombes in the next week and will likely get repeat blood tests.  She denies any dysuria, hematuria, urinary hesitancy or resistance.  She has chronic leg swelling, but denies any recent worsening of dyspnea.  HTN and CAD: Her BP is well controlled today, but has been fluctuating at other office visits.  She is currently on amlodipine, Coreg, Imdur and Lasix.  She denies any chest pain, dyspnea or palpitations.  Past Medical History:  Diagnosis Date   Breast cancer ([place])    Tamoxifen started 2007   CAD (coronary artery disease)    DES second diagonal 2/12   Cardiomyopathy    Probable Takotsubo, LVEF 30-35% 2/12   Carotid artery disease ([facility])    Essential hypertension    Hepatic steatosis    Hyperlipidemia    Hypothyroidism    NSTEMI (non-ST elevated myocardial infarction) ([place])    2/12   Pancreas divisum    Stroke ([place])    (2000) residual left-sided weakness   Type 2 diabetes mellitus ([place])    reports not currently diabetic    Past Surgical History:  Procedure Laterality Date   ABDOMINAL HYSTERECTOMY     CAROTID ENDARTERECTOMY     Left   CHOLECYSTECTOMY     COLONOSCOPY WITH PROPOFOL N/A 05/03/2020   Procedure: COLONOSCOPY WITH PROPOFOL;  Surgeon: Daneil Dolin, MD;  [facility];  Service: Endoscopy;   Laterality: N/A;   ESOPHAGOGASTRODUODENOSCOPY (EGD) WITH PROPOFOL N/A 05/02/2020   Procedure: ESOPHAGOGASTRODUODENOSCOPY (EGD) WITH PROPOFOL;  Surgeon: Rogene Houston, MD;  [facility];  Service: Endoscopy;  Laterality: N/A;   GIVENS CAPSULE STUDY N/A 05/04/2020   Procedure: GIVENS CAPSULE STUDY;  Surgeon: Harvel Quale, MD;  [facility];  Service: Gastroenterology;  Laterality: N/A;   JOINT REPLACEMENT     Right knee replacement- [facility]   MASTECTOMY     Bilateral   POLYPECTOMY  05/03/2020   Procedure: POLYPECTOMY;  Surgeon: Daneil Dolin, MD;  [facility];  Service: Endoscopy;;    Family History  Problem Relation Age of Onset   Heart attack Sister    Heart disease Sister    Heart attack Brother    Heart disease Brother    Throat cancer Brother    Colon cancer Neg Hx     Social History   Socioeconomic History   Marital status: Widowed    Spouse name: Not on file   Number of children: Not on file   Years of education: Not on file   Highest education level: Not on file  Occupational History   Not on file  Tobacco Use   Smoking status: Never   Smokeless tobacco: Never  Vaping Use   Vaping Use: Never used  Substance and Sexual Activity   Alcohol use: No    Alcohol/week: 0.0 standard drinks of alcohol   Drug use: No   Sexual activity: Not on file  Other Topics Concern   Not on file  Social History Narrative   Not on file   Social Determinants of Health   Financial Resource Strain: Low Risk  (09/25/2021)   Overall Financial Resource Strain (CARDIA)    Difficulty of Paying Living Expenses: Not hard at all  Food Insecurity: No Food Insecurity (09/25/2021)   Hunger Vital Sign    Worried About Running Out of Food in the Last Year: Never true    Ran Out of Food in the Last Year: Never true  Transportation Needs: No Transportation Needs (09/25/2021)   PRAPARE - Hydrologist (Medical):  No    Lack of Transportation (Non-Medical): No  Physical Activity: Insufficiently Active (09/25/2021)   Exercise Vital Sign    Days of Exercise per Week: 3 days    Minutes of Exercise per Session: 30 min  Stress: No Stress Concern Present (09/22/2020)   [place]    Feeling of Stress : Not at all  Social Connections: Moderately Isolated (09/22/2020)   Social Connection and Isolation Panel [NHANES]    Frequency of Communication with Friends and Family: More than three times a week    Frequency of Social Gatherings with Friends and Family: More than three times a week    Attends Religious Services: More than 4 times per year    Active Member of [employer] or Organizations: No    Attends Archivist Meetings: Never    Marital Status: Widowed  Intimate Partner Violence: Not At Risk (09/22/2020)   Humiliation, Afraid, Rape, and Kick questionnaire    Fear of Current or Ex-Partner: No    Emotionally Abused: No    Physically Abused: No    Sexually Abused: No    Outpatient Medications Prior to Visit  Medication Sig Dispense Refill   acetaminophen (TYLENOL) 325 MG tablet Take 2 tablets (650 mg total) by mouth every 6 (six) hours as needed for mild pain, fever or headache (or Fever >/= 101). 12 tablet 0   ALPRAZolam (XANAX) 0.25 MG tablet Take 1 tablet (0.25 mg total) by mouth 2 (two) times daily as needed for anxiety. 60 tablet 0   amLODipine (NORVASC) 10 MG tablet Take 1 tablet (10 mg total) by mouth daily. 90 tablet 3   anastrozole (ARIMIDEX) 1 MG tablet TAKE 1 TABLET BY MOUTH EVERY DAY 30 tablet 3   apixaban (ELIQUIS) 5 MG TABS tablet Take 1 tablet (5 mg total) by mouth 2 (two) times daily. 60 tablet 3   aspirin 81 MG tablet Take 1 tablet (81 mg total) by mouth daily with breakfast. 30 tablet 3   carvedilol (COREG) 12.5 MG tablet TAKE 1 TABLET (12.$RemoveBefo'5MG'RnvwYmiJIQC$  TOTAL) BY MOUTH TWICE A DAY WITH MEALS 90 tablet 3   cetirizine (ZYRTEC) 10  MG tablet Take 1 tablet (10 mg total) by mouth daily. 30 tablet 0   Cholecalciferol (VITAMIN D3) 125 MCG (5000 UT) CAPS TAKE 1 CAPSULE BY MOUTH ONCE DAILY (Patient taking differently: Take 1 capsule by mouth daily.) 90 capsule 1   dicyclomine (BENTYL) 10 MG capsule Take 1 capsule (10 mg total) by mouth 3 (three) times daily as needed for spasms. 20 capsule 0   ferrous sulfate 325 (65 FE) MG tablet Take 1 tablet (325 mg  total) by mouth daily with breakfast. 30 tablet 0   fluticasone (FLONASE) 50 MCG/ACT nasal spray Place 2 sprays into both nostrils daily. 16 g 1   furosemide (LASIX) 20 MG tablet TAKE 1 TABLET BY MOUTH EVERY DAY AS NEEDED AS DIRECTED 30 tablet 1   gabapentin (NEURONTIN) 100 MG capsule Take 1 capsule (100 mg total) by mouth at bedtime. 90 capsule 3   isosorbide mononitrate (IMDUR) 30 MG 24 hr tablet Take 1 tablet (30 mg total) by mouth daily. 90 tablet 2   KLOR-CON M10 10 MEQ tablet TAKE 1 TABLET BY MOUTH EVERY DAY 90 tablet 1   levothyroxine (SYNTHROID) 125 MCG tablet TAKE 1 TABLET BY MOUTH DAILY BEFORE BREAKFAST. 30 tablet 3   nitroGLYCERIN (NITROSTAT) 0.4 MG SL tablet Place 1 tablet (0.4 mg total) under the tongue every 5 (five) minutes x 3 doses as needed. 25 tablet 3   Omega-3 Fatty Acids 300 MG CAPS Take 1 capsule (300 mg total) by mouth 2 (two) times daily.     palbociclib (IBRANCE) 75 MG tablet Take 1 tablet (75 mg total) by mouth daily. Take for 21 days on, 7 days off, repeat every 28 days. 21 tablet 3   pantoprazole (PROTONIX) 40 MG tablet Take 1 tablet (40 mg total) by mouth daily. 90 tablet 1   prochlorperazine (COMPAZINE) 10 MG tablet Take 1 tablet (10 mg total) by mouth every 6 (six) hours as needed for nausea or vomiting. 60 tablet 4   rosuvastatin (CRESTOR) 40 MG tablet Take 1 tablet (40 mg total) by mouth daily. 90 tablet 2   traMADol (ULTRAM) 50 MG tablet Take 1 tablet (50 mg total) by mouth every 8 (eight) hours as needed. 90 tablet 0   amoxicillin-clavulanate  (AUGMENTIN) 875-125 MG tablet Take 1 tablet by mouth 2 (two) times daily. 14 tablet 0   oxyCODONE (ROXICODONE) 5 MG immediate release tablet Take 1 tablet (5 mg total) by mouth every 6 (six) hours as needed for severe pain. 10 tablet 0   oxyCODONE-acetaminophen (PERCOCET/ROXICET) 5-325 MG tablet Take 1 tablet by mouth every 6 (six) hours as needed for severe pain. 15 tablet 0   methylPREDNISolone (MEDROL DOSEPAK) 4 MG TBPK tablet Use as directed (Patient not taking: Reported on 10/24/2021) 21 each 0   No facility-administered medications prior to visit.    Allergies  Allergen Reactions   Codeine Nausea And Vomiting    ROS Review of Systems  Constitutional:  Negative for chills and fever.  HENT:  Negative for congestion, sinus pressure, sinus pain and sore throat.   Eyes:  Negative for pain and discharge.  Respiratory:  Negative for cough and shortness of breath.   Cardiovascular:  Positive for leg swelling. Negative for chest pain and palpitations.  Gastrointestinal:  Negative for abdominal pain, diarrhea, nausea and vomiting.  Endocrine: Negative for polydipsia and polyuria.  Genitourinary:  Negative for dysuria and hematuria.  Musculoskeletal:  Negative for neck pain and neck stiffness.  Skin:  Negative for rash.  Neurological:  Positive for weakness. Negative for dizziness.  Psychiatric/Behavioral:  Negative for agitation and behavioral problems.       Objective:    Physical Exam Vitals reviewed.  Constitutional:      General: She is not in acute distress.    Appearance: She is obese. She is not diaphoretic.     Comments: In wheelchair  HENT:     Head: Normocephalic and atraumatic.     Nose: Nose normal.  Mouth/Throat:     Mouth: Mucous membranes are moist.  Eyes:     General: No scleral icterus.    Extraocular Movements: Extraocular movements intact.  Cardiovascular:     Rate and Rhythm: Normal rate and regular rhythm.     Pulses: Normal pulses.     Heart  sounds: Murmur (Systolic, lower right upper sternal border) heard.  Pulmonary:     Breath sounds: Normal breath sounds. No wheezing or rales.  Abdominal:     Palpations: Abdomen is soft.     Tenderness: There is no abdominal tenderness.  Musculoskeletal:     Cervical back: Neck supple. No tenderness.     Right lower leg: Edema (1+) present.     Left lower leg: Edema (1+) present.     Comments: B/l UE swelling  Skin:    General: Skin is warm.     Findings: No rash.  Neurological:     General: No focal deficit present.     Mental Status: She is alert and oriented to person, place, and time.  Psychiatric:        Mood and Affect: Mood normal.        Behavior: Behavior normal.     BP 128/82 (BP Location: Right Arm, Patient Position: Sitting, Cuff Size: Normal)   Pulse 63   Resp 18   Ht $R'5\' 6"'VR$  (1.676 m)   Wt 201 lb 9.6 oz (91.4 kg)   SpO2 97%   BMI 32.54 kg/m  Wt Readings from Last 3 Encounters:  10/24/21 201 lb 9.6 oz (91.4 kg)  08/15/21 215 lb 12.8 oz (97.9 kg)  07/09/21 215 lb 8 oz (97.7 kg)    Lab Results  Component Value Date   TSH 0.694 06/24/2021   Lab Results  Component Value Date   WBC 7.4 09/25/2021   HGB 8.2 (L) 09/25/2021   HCT 27.0 (L) 09/25/2021   MCV 76.3 (L) 09/25/2021   PLT 221 09/25/2021   Lab Results  Component Value Date   NA 138 09/25/2021   K 3.7 09/25/2021   CO2 20 (L) 09/25/2021   GLUCOSE 98 09/25/2021   BUN 24 (H) 09/25/2021   CREATININE 1.11 (H) 09/25/2021   BILITOT 0.7 09/25/2021   ALKPHOS 188 (H) 09/25/2021   AST 35 09/25/2021   ALT 15 09/25/2021   PROT 7.2 09/25/2021   ALBUMIN 3.8 09/25/2021   CALCIUM 9.5 09/25/2021   ANIONGAP 8 09/25/2021   EGFR 31 (L) 04/11/2020   Lab Results  Component Value Date   CHOL 145 03/01/2020   Lab Results  Component Value Date   HDL 46 (L) 03/01/2020   Lab Results  Component Value Date   LDLCALC 80 03/01/2020   Lab Results  Component Value Date   TRIG 109 03/01/2020   Lab Results   Component Value Date   CHOLHDL 3.2 03/01/2020   Lab Results  Component Value Date   HGBA1C 5.9 (H) 03/01/2020      Assessment & Plan:   Problem List Items Addressed This Visit       Cardiovascular and Mediastinum   Essential hypertension, benign - Primary    BP Readings from Last 1 Encounters:  10/24/21 128/82  Well-controlled with amlodipine, Coreg, Imdur and Lasix Counseled for compliance with the medications Advised DASH diet and walking as tolerated        Endocrine   Hypothyroidism    Lab Results  Component Value Date   TSH 0.694 06/24/2021  On Levothyroxine 125 mcg  QD Followed by endocrinology        Genitourinary   Stage 3a chronic kidney disease ([place])    Last BMP reviewed, stable GFR Avoid nephrotoxic agents On Lasix for LE edema        Other   Anemia    Had GI blood loss in the past Hgb ~ 9, slightly lower in last few CBC No melena or hematochezia currently F/u Heme/Onc. - on oral iron supplements      Recurrent malignant neoplasm of left breast ([place])    CT chest showed recurrence of breast ca. On Anastrozole and Ibrance F/u with Heme/Onc.      Other Visit Diagnoses     Need for immunization against influenza       Relevant Orders   Flu Vaccine QUAD High Dose(Fluad) (Completed)       No orders of the defined types were placed in this encounter.   Follow-up: Return in about 4 months (around 02/24/2022) for HTN, CAD and CKD.    [name], MD

## 2021-10-24 NOTE — Assessment & Plan Note (Signed)
Had GI blood loss in the past Hgb ~ 9, slightly lower in last few CBC No melena or hematochezia currently F/u Heme/Onc. - on oral iron supplements

## 2021-10-24 NOTE — Patient Instructions (Signed)
Please continue taking medications as prescribed.  Please continue to follow low salt diet and ambulate with support.

## 2021-10-28 ENCOUNTER — Inpatient Hospital Stay: Payer: Medicare PPO | Attending: Hematology

## 2021-10-28 ENCOUNTER — Other Ambulatory Visit (HOSPITAL_COMMUNITY): Payer: Self-pay

## 2021-10-28 ENCOUNTER — Inpatient Hospital Stay: Payer: Medicare PPO | Admitting: Hematology

## 2021-10-28 ENCOUNTER — Other Ambulatory Visit: Payer: Self-pay

## 2021-10-28 ENCOUNTER — Other Ambulatory Visit: Payer: Self-pay | Admitting: *Deleted

## 2021-10-28 ENCOUNTER — Inpatient Hospital Stay: Payer: Medicare PPO

## 2021-10-28 VITALS — BP 155/59 | HR 60 | Temp 97.0°F | Resp 18 | Ht 66.0 in | Wt 201.0 lb

## 2021-10-28 DIAGNOSIS — C7951 Secondary malignant neoplasm of bone: Secondary | ICD-10-CM | POA: Diagnosis not present

## 2021-10-28 DIAGNOSIS — Z79890 Hormone replacement therapy (postmenopausal): Secondary | ICD-10-CM | POA: Insufficient documentation

## 2021-10-28 DIAGNOSIS — Z79811 Long term (current) use of aromatase inhibitors: Secondary | ICD-10-CM | POA: Insufficient documentation

## 2021-10-28 DIAGNOSIS — K5900 Constipation, unspecified: Secondary | ICD-10-CM | POA: Insufficient documentation

## 2021-10-28 DIAGNOSIS — Z7982 Long term (current) use of aspirin: Secondary | ICD-10-CM | POA: Diagnosis not present

## 2021-10-28 DIAGNOSIS — D649 Anemia, unspecified: Secondary | ICD-10-CM | POA: Insufficient documentation

## 2021-10-28 DIAGNOSIS — Z79899 Other long term (current) drug therapy: Secondary | ICD-10-CM | POA: Insufficient documentation

## 2021-10-28 DIAGNOSIS — Z7901 Long term (current) use of anticoagulants: Secondary | ICD-10-CM | POA: Insufficient documentation

## 2021-10-28 DIAGNOSIS — Z86718 Personal history of other venous thrombosis and embolism: Secondary | ICD-10-CM | POA: Insufficient documentation

## 2021-10-28 DIAGNOSIS — M7989 Other specified soft tissue disorders: Secondary | ICD-10-CM | POA: Diagnosis not present

## 2021-10-28 DIAGNOSIS — D508 Other iron deficiency anemias: Secondary | ICD-10-CM

## 2021-10-28 DIAGNOSIS — Z170 Estrogen receptor positive status [ER+]: Secondary | ICD-10-CM | POA: Diagnosis not present

## 2021-10-28 DIAGNOSIS — C50912 Malignant neoplasm of unspecified site of left female breast: Secondary | ICD-10-CM | POA: Diagnosis not present

## 2021-10-28 LAB — COMPREHENSIVE METABOLIC PANEL
ALT: 14 U/L (ref 0–44)
AST: 24 U/L (ref 15–41)
Albumin: 3.5 g/dL (ref 3.5–5.0)
Alkaline Phosphatase: 266 U/L — ABNORMAL HIGH (ref 38–126)
Anion gap: 5 (ref 5–15)
BUN: 17 mg/dL (ref 8–23)
CO2: 20 mmol/L — ABNORMAL LOW (ref 22–32)
Calcium: 8.8 mg/dL — ABNORMAL LOW (ref 8.9–10.3)
Chloride: 112 mmol/L — ABNORMAL HIGH (ref 98–111)
Creatinine, Ser: 1.09 mg/dL — ABNORMAL HIGH (ref 0.44–1.00)
GFR, Estimated: 50 mL/min — ABNORMAL LOW (ref >60)
Glucose, Bld: 100 mg/dL — ABNORMAL HIGH (ref 70–99)
Potassium: 3.4 mmol/L — ABNORMAL LOW (ref 3.5–5.1)
Sodium: 137 mmol/L (ref 135–145)
Total Bilirubin: 0.7 mg/dL (ref 0.3–1.2)
Total Protein: 6.7 g/dL (ref 6.5–8.1)

## 2021-10-28 LAB — CBC WITH DIFFERENTIAL/PLATELET
Abs Immature Granulocytes: 0.02 10*3/uL (ref 0.00–0.07)
Basophils Absolute: 0 10*3/uL (ref 0.0–0.1)
Basophils Relative: 1 %
Eosinophils Absolute: 0 10*3/uL (ref 0.0–0.5)
Eosinophils Relative: 0 %
HCT: 25.1 % — ABNORMAL LOW (ref 36.0–46.0)
Hemoglobin: 7.6 g/dL — ABNORMAL LOW (ref 12.0–15.0)
Immature Granulocytes: 1 %
Lymphocytes Relative: 30 %
Lymphs Abs: 0.7 10*3/uL (ref 0.7–4.0)
MCH: 26 pg (ref 26.0–34.0)
MCHC: 30.3 g/dL (ref 30.0–36.0)
MCV: 86 fL (ref 80.0–100.0)
Monocytes Absolute: 0.4 10*3/uL (ref 0.1–1.0)
Monocytes Relative: 17 %
Neutro Abs: 1.2 10*3/uL — ABNORMAL LOW (ref 1.7–7.7)
Neutrophils Relative %: 51 %
Platelets: 194 10*3/uL (ref 150–400)
RBC: 2.92 MIL/uL — ABNORMAL LOW (ref 3.87–5.11)
RDW: 27.7 % — ABNORMAL HIGH (ref 11.5–15.5)
WBC: 2.4 10*3/uL — ABNORMAL LOW (ref 4.0–10.5)
nRBC: 0 % (ref 0.0–0.2)

## 2021-10-28 LAB — IRON AND TIBC
Iron: 37 ug/dL (ref 28–170)
Saturation Ratios: 8 % — ABNORMAL LOW (ref 10.4–31.8)
TIBC: 469 ug/dL — ABNORMAL HIGH (ref 250–450)
UIBC: 432 ug/dL

## 2021-10-28 LAB — FERRITIN: Ferritin: 18 ng/mL (ref 11–307)

## 2021-10-28 LAB — MAGNESIUM: Magnesium: 2.1 mg/dL (ref 1.7–2.4)

## 2021-10-28 MED ORDER — PALBOCICLIB 75 MG PO TABS: 75.0000 mg | ORAL_TABLET | Freq: Every day | ORAL | 3 refills | Status: DC

## 2021-10-28 MED ORDER — PALBOCICLIB 75 MG PO TABS
75.0000 mg | ORAL_TABLET | Freq: Every day | ORAL | 3 refills | Status: DC
  Filled 2021-10-28: qty 21, 28d supply, fill #0

## 2021-10-28 MED ORDER — DENOSUMAB 120 MG/1.7ML ~~LOC~~ SOLN
120.0000 mg | Freq: Once | SUBCUTANEOUS | Status: AC
  Administered 2021-10-28: 120 mg via SUBCUTANEOUS
  Filled 2021-10-28: qty 1.7

## 2021-10-28 NOTE — Addendum Note (Signed)
Addended by: Darl Pikes on: 10/28/2021 03:42 PM   Modules accepted: Orders

## 2021-10-28 NOTE — Telephone Encounter (Signed)
Patient fills her medication through mfg assistance using [facility]. Prescription sent to [facility] ([facility]) redirect to [facility].

## 2021-10-28 NOTE — Patient Instructions (Addendum)
[place] at [facility] Discharge Instructions   You were seen and examined today by Dr. Delton Coombes.  He reviewed the results of your lab work. Your hemoglobin has been trending down. We will arrange for you to have 3 iron infusions.   Continue Ibrance as prescribed.  Return as scheduled.      Thank you for choosing [place] at [facility] to provide your oncology and hematology care.  To afford each patient quality time with our provider, please arrive at least 15 minutes before your scheduled appointment time.   If you have a lab appointment with the [facility] please come in thru the Main Entrance and check in at the main information desk.  You need to re-schedule your appointment should you arrive 10 or more minutes late.  We strive to give you quality time with our providers, and arriving late affects you and other patients whose appointments are after yours.  Also, if you no show three or more times for appointments you may be dismissed from the clinic at the providers discretion.     Again, thank you for choosing [facility].  Our hope is that these requests will decrease the amount of time that you wait before being seen by our physicians.       _____________________________________________________________  Should you have questions after your visit to [facility], please contact our office at [phone] and follow the prompts.  Our office hours are 8:00 a.m. and 4:30 p.m. Monday - Friday.  Please note that voicemails left after 4:00 p.m. may not be returned until the following business day.  We are closed weekends and major holidays.  You do have access to a nurse 24-7, just call the main number to the clinic [phone] and do not press any options, hold on the line and a nurse will answer the phone.    For prescription refill requests, have your pharmacy contact our office and allow 72 hours.     Due to Covid, you will need to wear a mask upon entering the hospital. If you do not have a mask, a mask will be given to you at the Main Entrance upon arrival. For doctor visits, patients may have 1 support person age 62 or older with them. For treatment visits, patients can not have anyone with them due to social distancing guidelines and our immunocompromised population.

## 2021-10-28 NOTE — Patient Instructions (Signed)
[facility]  Discharge Instructions: Thank you for choosing [facility] to provide your oncology and hematology care.  If you have a lab appointment with the [facility], please come in thru the Main Entrance and check in at the main information desk.  Wear comfortable clothing and clothing appropriate for easy access to any Portacath or PICC line.   We strive to give you quality time with your provider. You may need to reschedule your appointment if you arrive late (15 or more minutes).  Arriving late affects you and other patients whose appointments are after yours.  Also, if you miss three or more appointments without notifying the office, you may be dismissed from the clinic at the provider's discretion.      For prescription refill requests, have your pharmacy contact our office and allow 72 hours for refills to be completed.    Today you received the following chemotherapy and/or immunotherapy agents xgeva. Denosumab Injection (Oncology) What is this medication? DENOSUMAB (den oh SUE mab) prevents weakened bones caused by cancer. It may also be used to treat noncancerous bone tumors that cannot be removed by surgery. It can also be used to treat high calcium levels in the blood caused by cancer. It works by blocking a protein that causes bones to break down quickly. This slows down the release of calcium from bones, which lowers calcium levels in your blood. It also makes your bones stronger and less likely to break (fracture). This medicine may be used for other purposes; ask your health care provider or pharmacist if you have questions. COMMON BRAND NAME(S): XGEVA What should I tell my care team before I take this medication? They need to know if you have any of these conditions: Dental disease Having surgery or tooth extraction Infection Kidney disease Low levels of calcium or vitamin D in the blood Malnutrition On hemodialysis Skin conditions  or sensitivity Thyroid or parathyroid disease An unusual reaction to denosumab, other medications, foods, dyes, or preservatives Pregnant or trying to get pregnant Breast-feeding How should I use this medication? This medication is for injection under the skin. It is given by your care team in a hospital or clinic setting. A special MedGuide will be given to you before each treatment. Be sure to read this information carefully each time. Talk to your care team about the use of this medication in children. While it may be prescribed for children as young as 13 years for selected conditions, precautions do apply. Overdosage: If you think you have taken too much of this medicine contact a poison control center or emergency room at once. NOTE: This medicine is only for you. Do not share this medicine with others. What if I miss a dose? Keep appointments for follow-up doses. It is important not to miss your dose. Call your care team if you are unable to keep an appointment. What may interact with this medication? Do not take this medication with any of the following: Other medications containing denosumab This medication may also interact with the following: Medications that lower your chance of fighting infection Steroid medications, such as prednisone or cortisone This list may not describe all possible interactions. Give your health care provider a list of all the medicines, herbs, non-prescription drugs, or dietary supplements you use. Also tell them if you smoke, drink alcohol, or use illegal drugs. Some items may interact with your medicine. What should I watch for while using this medication? Your condition will be  monitored carefully while you are receiving this medication. You may need blood work while taking this medication. This medication may increase your risk of getting an infection. Call your care team for advice if you get a fever, chills, sore throat, or other symptoms of a cold or  flu. Do not treat yourself. Try to avoid being around people who are sick. You should make sure you get enough calcium and vitamin D while you are taking this medication, unless your care team tells you not to. Discuss the foods you eat and the vitamins you take with your care team. Some people who take this medication have severe bone, joint, or muscle pain. This medication may also increase your risk for jaw problems or a broken thigh bone. Tell your care team right away if you have severe pain in your jaw, bones, joints, or muscles. Tell your care team if you have any pain that does not go away or that gets worse. Talk to your care team if you may be pregnant. Serious birth defects can occur if you take this medication during pregnancy and for 5 months after the last dose. You will need a negative pregnancy test before starting this medication. Contraception is recommended while taking this medication and for 5 months after the last dose. Your care team can help you find the option that works for you. What side effects may I notice from receiving this medication? Side effects that you should report to your care team as soon as possible: Allergic reactions--skin rash, itching, hives, swelling of the face, lips, tongue, or throat Bone, joint, or muscle pain Low calcium level--muscle pain or cramps, confusion, tingling, or numbness in the hands or feet Osteonecrosis of the jaw--pain, swelling, or redness in the mouth, numbness of the jaw, poor healing after dental work, unusual discharge from the mouth, visible bones in the mouth Side effects that usually do not require medical attention (report to your care team if they continue or are bothersome): Cough Diarrhea Fatigue Headache Nausea This list may not describe all possible side effects. Call your doctor for medical advice about side effects. You may report side effects to FDA at 1-800-FDA-1088. Where should I keep my medication? This medication  is given in a hospital or clinic. It will not be stored at home. NOTE: This sheet is a summary. It may not cover all possible information. If you have questions about this medicine, talk to your doctor, pharmacist, or health care provider.  2023 Elsevier/Gold Standard (2021-05-27 00:00:00)       To help prevent nausea and vomiting after your treatment, we encourage you to take your nausea medication as directed.  BELOW ARE SYMPTOMS THAT SHOULD BE REPORTED IMMEDIATELY: *FEVER GREATER THAN 100.4 F (38 C) OR HIGHER *CHILLS OR SWEATING *NAUSEA AND VOMITING THAT IS NOT CONTROLLED WITH YOUR NAUSEA MEDICATION *UNUSUAL SHORTNESS OF BREATH *UNUSUAL BRUISING OR BLEEDING *URINARY PROBLEMS (pain or burning when urinating, or frequent urination) *BOWEL PROBLEMS (unusual diarrhea, constipation, pain near the anus) TENDERNESS IN MOUTH AND THROAT WITH OR WITHOUT PRESENCE OF ULCERS (sore throat, sores in mouth, or a toothache) UNUSUAL RASH, SWELLING OR PAIN  UNUSUAL VAGINAL DISCHARGE OR ITCHING   Items with * indicate a potential emergency and should be followed up as soon as possible or go to the Emergency Department if any problems should occur.  Please show the CHEMOTHERAPY ALERT CARD or IMMUNOTHERAPY ALERT CARD at check-in to the Emergency Department and triage nurse.  Should you have questions after  your visit or need to cancel or reschedule your appointment, please contact [facility] [phone]  and follow the prompts.  Office hours are 8:00 a.m. to 4:30 p.m. Monday - Friday. Please note that voicemails left after 4:00 p.m. may not be returned until the following business day.  We are closed weekends and major holidays. You have access to a nurse at all times for urgent questions. Please call the main number to the clinic [phone] and follow the prompts.  For any non-urgent questions, you may also contact your provider using MyChart. We now offer e-Visits for anyone 33  and older to request care online for non-urgent symptoms. For details visit [email or web address].   Also download the MyChart app! Go to the app store, search "MyChart", open the app, select [facility], and log in with your MyChart username and password.  Masks are optional in the cancer centers. If you would like for your care team to wear a mask while they are taking care of you, please let them know. You may have one support person who is at least 83 years old accompany you for your appointments.

## 2021-10-28 NOTE — Progress Notes (Signed)
Patient is taking Ibrance as prescribed.  She has not missed any doses and reports no side effects at this time.   

## 2021-10-28 NOTE — Progress Notes (Signed)
[facility] [address], [place] [address]   Patient Care Team: [name], MD as PCP - General (Internal Medicine) Satira Sark, MD as PCP - Cardiology (Cardiology) Brien Mates, RN as Oncology Nurse Navigator (Oncology) Derek Jack, MD as Medical Oncologist (Medical Oncology)  CHIEF COMPLIANT: Follow-up of left breast cancer with metastases to bones   INTERVAL HISTORY: [name] is a 81 y.o. female seen for follow-up of her metastatic breast cancer to the bones.  She is taking anastrozole.  She reportedly started taking Ibrance 75 mg 3 weeks on/1 week off at last visit 4 weeks ago.  She was supposed to start back cycle 2 on 10/26/2021 but did not have the medication.  Has chronic constipation which is stable.  Energy levels are 100%.  REVIEW OF SYSTEMS:   Review of Systems  Constitutional:  Negative for appetite change and unexpected weight change.  Gastrointestinal:  Positive for constipation.  Musculoskeletal:  Negative for back pain.  All other systems reviewed and are negative.   I have reviewed the past medical history, past surgical history, social history and family history with the patient and they are unchanged from previous note.   ALLERGIES:   is allergic to codeine.   MEDICATIONS:  Current Outpatient Medications  Medication Sig Dispense Refill   acetaminophen (TYLENOL) 325 MG tablet Take 2 tablets (650 mg total) by mouth every 6 (six) hours as needed for mild pain, fever or headache (or Fever >/= 101). 12 tablet 0   ALPRAZolam (XANAX) 0.25 MG tablet Take 1 tablet (0.25 mg total) by mouth 2 (two) times daily as needed for anxiety. 60 tablet 0   amLODipine (NORVASC) 10 MG tablet Take 1 tablet (10 mg total) by mouth daily. 90 tablet 3   anastrozole (ARIMIDEX) 1 MG tablet TAKE 1 TABLET BY MOUTH EVERY DAY 30 tablet 3   apixaban (ELIQUIS) 5 MG TABS tablet Take 1 tablet (5 mg total) by mouth 2 (two) times daily. 60  tablet 3   aspirin 81 MG tablet Take 1 tablet (81 mg total) by mouth daily with breakfast. 30 tablet 3   carvedilol (COREG) 12.5 MG tablet TAKE 1 TABLET (12.$RemoveBefo'5MG'CZWWjElzlvF$  TOTAL) BY MOUTH TWICE A DAY WITH MEALS 90 tablet 3   cetirizine (ZYRTEC) 10 MG tablet Take 1 tablet (10 mg total) by mouth daily. 30 tablet 0   Cholecalciferol (VITAMIN D3) 125 MCG (5000 UT) CAPS TAKE 1 CAPSULE BY MOUTH ONCE DAILY (Patient taking differently: Take 1 capsule by mouth daily.) 90 capsule 1   dicyclomine (BENTYL) 10 MG capsule Take 1 capsule (10 mg total) by mouth 3 (three) times daily as needed for spasms. 20 capsule 0   ferrous sulfate 325 (65 FE) MG tablet Take 1 tablet (325 mg total) by mouth daily with breakfast. 30 tablet 0   fluticasone (FLONASE) 50 MCG/ACT nasal spray Place 2 sprays into both nostrils daily. 16 g 1   furosemide (LASIX) 20 MG tablet TAKE 1 TABLET BY MOUTH EVERY DAY AS NEEDED AS DIRECTED 30 tablet 1   gabapentin (NEURONTIN) 100 MG capsule Take 1 capsule (100 mg total) by mouth at bedtime. 90 capsule 3   isosorbide mononitrate (IMDUR) 30 MG 24 hr tablet Take 1 tablet (30 mg total) by mouth daily. 90 tablet 2   KLOR-CON M10 10 MEQ tablet TAKE 1 TABLET BY MOUTH EVERY DAY 90 tablet 1   levothyroxine (SYNTHROID) 125 MCG tablet TAKE 1 TABLET BY MOUTH  DAILY BEFORE BREAKFAST. 30 tablet 3   nitroGLYCERIN (NITROSTAT) 0.4 MG SL tablet Place 1 tablet (0.4 mg total) under the tongue every 5 (five) minutes x 3 doses as needed. 25 tablet 3   Omega-3 Fatty Acids 300 MG CAPS Take 1 capsule (300 mg total) by mouth 2 (two) times daily.     palbociclib (IBRANCE) 75 MG tablet Take 1 tablet (75 mg total) by mouth daily. Take for 21 days on, 7 days off, repeat every 28 days. 21 tablet 3   pantoprazole (PROTONIX) 40 MG tablet Take 1 tablet (40 mg total) by mouth daily. 90 tablet 1   prochlorperazine (COMPAZINE) 10 MG tablet Take 1 tablet (10 mg total) by mouth every 6 (six) hours as needed for nausea or vomiting. 60 tablet 4    rosuvastatin (CRESTOR) 40 MG tablet Take 1 tablet (40 mg total) by mouth daily. 90 tablet 2   traMADol (ULTRAM) 50 MG tablet Take 1 tablet (50 mg total) by mouth every 8 (eight) hours as needed. 90 tablet 0   No current facility-administered medications for this visit.     PHYSICAL EXAMINATION: Performance status (ECOG): 2 - Symptomatic, <50% confined to bed  There were no vitals filed for this visit. Wt Readings from Last 3 Encounters:  10/24/21 201 lb 9.6 oz (91.4 kg)  08/15/21 215 lb 12.8 oz (97.9 kg)  07/09/21 215 lb 8 oz (97.7 kg)   Physical Exam Vitals reviewed.  Constitutional:      Appearance: Normal appearance. She is obese.     Comments: In wheelchair  Cardiovascular:     Rate and Rhythm: Normal rate and regular rhythm.     Pulses: Normal pulses.     Heart sounds: Normal heart sounds.  Pulmonary:     Effort: Pulmonary effort is normal.     Breath sounds: Normal breath sounds.  Neurological:     General: No focal deficit present.     Mental Status: She is alert and oriented to person, place, and time.  Psychiatric:        Mood and Affect: Mood normal.        Behavior: Behavior normal.    Breast Exam Chaperone: Thana Ates     LABORATORY DATA:  I have reviewed the data as listed    Latest Ref Rng & Units 09/25/2021   12:48 PM 08/15/2021   10:24 AM 07/25/2021    1:07 PM  CMP  Glucose 70 - 99 mg/dL 98  107  100   BUN 8 - 23 mg/dL $Remove'24  21  27   'eGSLcdz$ Creatinine 0.44 - 1.00 mg/dL 1.11  1.12  1.11   Sodium 135 - 145 mmol/L 138  139  142   Potassium 3.5 - 5.1 mmol/L 3.7  3.4  3.5   Chloride 98 - 111 mmol/L 110  108  108   CO2 22 - 32 mmol/L $RemoveB'20  23  27   'uFvYdTcP$ Calcium 8.9 - 10.3 mg/dL 9.5  9.5  10.9   Total Protein 6.5 - 8.1 g/dL 7.2  6.9  7.5   Total Bilirubin 0.3 - 1.2 mg/dL 0.7  0.6  0.7   Alkaline Phos 38 - 126 U/L 188  176  115   AST 15 - 41 U/L 35  30  30   ALT 0 - 44 U/L $Remo'15  13  13    'GOJPF$ Lab Results  Component Value Date   CAN153 112.0 (H) 08/15/2021   CAN153 73.7  (H) 06/18/2021  GGE366 42.0 (H) 02/05/2021   Lab Results  Component Value Date   WBC 2.4 (L) 10/28/2021   HGB 7.6 (L) 10/28/2021   HCT 25.1 (L) 10/28/2021   MCV 86.0 10/28/2021   PLT 194 10/28/2021   NEUTROABS PENDING 10/28/2021    ASSESSMENT:  1.  Recurrent breast carcinoma with metastases to bones: -History of right breast cancer in 1987, status post right mastectomy requiring no adjuvant chemo or XRT.  Was not on tamoxifen. -History of left breast cancer in 2007, status post mastectomy by Dr. Carlis Abbott at [facility], placed on tamoxifen, discontinued around 2015. -Recent upper back pain, weight loss of 20 pounds in the last 3 to 6 months. -CT angio on 04/04/2020 showed irregular mass in the left chest wall concerning for recurrent breast cancer measuring 4.5 x 3.1 cm. -Also showed lytic lesions in T1 and T3 vertebral bodies as well as manubrium. -Left chest wall mass biopsy on 04/05/2020 consistent with adenocarcinoma compatible with recurrent breast carcinoma.  ER 95% positive, PR 40% positive, HER2 2+, negative by FISH. - PET scan on 05/15/2020 shows hypermetabolic mass in the left anterior chest wall, mild hypermetabolic thoracic lymphadenopathy, diffuse bone metastasis throughout the spine, sternum, both shoulders, pelvis, proximal right femur which are both lytic and sclerotic. - Anastrozole started around 05/21/2020.  She never started palbociclib even though it was recommended at that time. - Bone scan (07/09/2021): Increased uptake over the right frontal bone, cervical, thoracic and lumbar spine.  Multifocal areas of increased activity noted throughout the sacrum and pelvis.  Increased activity noted over the sternum.  Increased activity noted over the proximal right humerus and over the lateral aspect of the left scapula.  Focal area of increased activity noted over the proximal right femur. - Took Ibrance 100 mg from 06/28/2021 through 07/04/2021, discontinued secondary to  nervousness, nausea and vomiting. - Ibrance 75 mg daily started on 09/25/2021   2.  Left upper extremity DVT: -Left upper extremity Doppler on 04/04/2020 + for DVT involving left internal jugular vein, left subclavian vein, left axillary vein, left brachial and left basilic vein.   3.  Family history: - No major family history of malignancies other than her brother with throat cancer.  4.  Left kidney mass: - Last CT scan showed 2.1 x 1.7 cm exophytic soft tissue lesion in the left lateral inferior pole of the left kidney.  It will be followed on subsequent scans.   PLAN:  1.  Metastatic breast cancer to the bones, ER/PR positive: - Ibrance 75 mg daily 3 weeks on/1 week off started around 09/25/2021. - She has tolerated it very well. - Reviewed labs today which showed alk phos is 266 and rest of LFTs are normal.  CBC shows leukopenia with white count 2.4 and ANC of 1.2. - She is waiting for shipment of her next bottle.  She will start taking it as soon as she receives Ibrance at the same dose.  If she tolerates well next cycle, I will increase dose of Ibrance to 100 mg 3 weeks on/1 week off. - RTC 4 weeks for follow-up.   2.  Left upper extremity DVT in the setting of malignancy: - Continue Eliquis 5 mg twice daily.  No bleeding issues.   3.  Right sternal/chest wall pain: - Continue tramadol 50 mg 3 times daily as needed.   4.  Bone metastasis: - Calcium is normal.  Continue denosumab monthly.  5.  Normocytic anemia: - Ferritin is 18 and  percent saturation 8.  Hemoglobin is 7.6.  She is taking iron tablet daily with no improvement. - Recommend parenteral iron therapy.  Will give Venofer x3.  Discussed side effects in detail.  Breast Cancer therapy associated bone loss: I have recommended calcium, Vitamin D and weight bearing exercises.  Orders placed this encounter:  No orders of the defined types were placed in this encounter.   The patient has a good understanding of the overall  plan. She agrees with it. She will call with any problems that may develop before the next visit here.  Derek Jack, MD [facility] [phone]

## 2021-10-28 NOTE — Progress Notes (Signed)
Patient taking calcium as directed.  Denied tooth, jaw, and leg pain.  No recent or upcoming dental visits.  Labs reviewed.  Patient tolerated injection with no complaints voiced.  See MAR for details.  Patient stable during and after injection.  Site clean and dry with no bruising or swelling noted.  Band aid applied.  Vss with discharge and left in satisfactory condition with no s/s of distress noted.   

## 2021-10-29 LAB — CANCER ANTIGEN 15-3: CA 15-3: 87 U/mL — ABNORMAL HIGH (ref 0.0–25.0)

## 2021-10-29 LAB — CANCER ANTIGEN 27.29: CA 27.29: 137.2 U/mL — ABNORMAL HIGH (ref 0.0–38.6)

## 2021-10-30 ENCOUNTER — Other Ambulatory Visit (HOSPITAL_COMMUNITY): Payer: Self-pay

## 2021-10-31 ENCOUNTER — Other Ambulatory Visit: Payer: Self-pay | Admitting: Internal Medicine

## 2021-10-31 DIAGNOSIS — M4722 Other spondylosis with radiculopathy, cervical region: Secondary | ICD-10-CM

## 2021-11-06 ENCOUNTER — Inpatient Hospital Stay: Payer: Medicare PPO | Attending: Cardiology | Admitting: Cardiology

## 2021-11-06 ENCOUNTER — Encounter: Payer: Self-pay | Admitting: Cardiology

## 2021-11-06 VITALS — BP 144/64 | HR 63 | Ht 66.0 in | Wt 200.0 lb

## 2021-11-06 DIAGNOSIS — Z8679 Personal history of other diseases of the circulatory system: Secondary | ICD-10-CM | POA: Diagnosis not present

## 2021-11-06 DIAGNOSIS — I25119 Atherosclerotic heart disease of native coronary artery with unspecified angina pectoris: Secondary | ICD-10-CM

## 2021-11-06 NOTE — Patient Instructions (Signed)
Medication Instructions:  Your physician recommends that you continue on your current medications as directed. Please refer to the Current Medication list given to you today.   Labwork: None today  Testing/Procedures: None today  Follow-Up: 6 months [facility]  Any Other Special Instructions Will Be Listed Below (If Applicable).  If you need a refill on your cardiac medications before your next appointment, please call your pharmacy.

## 2021-11-06 NOTE — Progress Notes (Signed)
Cardiology Office Note  Date: 11/06/2021   ID: Robin [name], DOB [birth year], MRN [id number]  PCP:  [name], MD  Cardiologist:  Rozann Lesches, MD Electrophysiologist:  None   Chief Complaint  Patient presents with   Cardiac follow-up    History of Present Illness: Robin [name] is an 81 y.o. female last seen in August 2022 by Mr. Leonides Sake NP, I reviewed the note (I last evaluated her in 2021).  She is here with family member for a follow-up visit.  Reports no chest pain, NYHA class II dyspnea with typical ADLs.  She is in a wheelchair today.  Still using compression stockings for control of leg edema/lymphedema.  Her granddaughter helps her put these on the morning.  Echocardiogram in April 2022 revealed LVEF 60 to 65% with mild diastolic dysfunction.  She has a history of stress-induced cardiomyopathy with no recurrence.  She continues to follow with oncology, history of left breast cancer with metastasis to bones.  I reviewed her medications and also recent lab work.  She is on Eliquis for chronic treatment of left upper extremity DVT followed by oncology.  Past Medical History:  Diagnosis Date   Breast cancer ([place])    Tamoxifen started 2007   CAD (coronary artery disease)    DES second diagonal 2/12   Cardiomyopathy    Probable Takotsubo, LVEF 30-35% 2/12   Carotid artery disease ([facility])    Essential hypertension    Hepatic steatosis    Hyperlipidemia    Hypothyroidism    NSTEMI (non-ST elevated myocardial infarction) ([place])    2/12   Pancreas divisum    Stroke ([place])    (2000) residual left-sided weakness   Type 2 diabetes mellitus ([place])    reports not currently diabetic    Past Surgical History:  Procedure Laterality Date   ABDOMINAL HYSTERECTOMY     CAROTID ENDARTERECTOMY     Left   CHOLECYSTECTOMY     COLONOSCOPY WITH PROPOFOL N/A 05/03/2020   Procedure: COLONOSCOPY WITH PROPOFOL;  Surgeon: Daneil Dolin, MD;  [facility];  Service:  Endoscopy;  Laterality: N/A;   ESOPHAGOGASTRODUODENOSCOPY (EGD) WITH PROPOFOL N/A 05/02/2020   Procedure: ESOPHAGOGASTRODUODENOSCOPY (EGD) WITH PROPOFOL;  Surgeon: Rogene Houston, MD;  [facility];  Service: Endoscopy;  Laterality: N/A;   GIVENS CAPSULE STUDY N/A 05/04/2020   Procedure: GIVENS CAPSULE STUDY;  Surgeon: Harvel Quale, MD;  [facility];  Service: Gastroenterology;  Laterality: N/A;   JOINT REPLACEMENT     Right [facility]- [facility]   MASTECTOMY     Bilateral   POLYPECTOMY  05/03/2020   Procedure: POLYPECTOMY;  Surgeon: Daneil Dolin, MD;  [facility];  Service: Endoscopy;;    Current Outpatient Medications  Medication Sig Dispense Refill   acetaminophen (TYLENOL) 325 MG tablet Take 2 tablets (650 mg total) by mouth every 6 (six) hours as needed for mild pain, fever or headache (or Fever >/= 101). 12 tablet 0   ALPRAZolam (XANAX) 0.25 MG tablet Take 1 tablet (0.25 mg total) by mouth 2 (two) times daily as needed for anxiety. 60 tablet 0   amLODipine (NORVASC) 10 MG tablet Take 1 tablet (10 mg total) by mouth daily. 90 tablet 3   anastrozole (ARIMIDEX) 1 MG tablet TAKE 1 TABLET BY MOUTH EVERY DAY 30 tablet 3   apixaban (ELIQUIS) 5 MG TABS tablet Take 1 tablet (5 mg total) by mouth 2 (two) times  daily. 60 tablet 3   aspirin 81 MG tablet Take 1 tablet (81 mg total) by mouth daily with breakfast. 30 tablet 3   carvedilol (COREG) 12.5 MG tablet TAKE 1 TABLET (12.'5MG'$  TOTAL) BY MOUTH TWICE A DAY WITH MEALS 90 tablet 3   cetirizine (ZYRTEC) 10 MG tablet Take 1 tablet (10 mg total) by mouth daily. 30 tablet 0   Cholecalciferol (VITAMIN D3) 125 MCG (5000 UT) CAPS TAKE 1 CAPSULE BY MOUTH ONCE DAILY (Patient taking differently: Take 1 capsule by mouth daily.) 90 capsule 1   dicyclomine (BENTYL) 10 MG capsule Take 1 capsule (10 mg total) by mouth 3 (three) times daily as needed for spasms. 20 capsule 0   ferrous sulfate 325  (65 FE) MG tablet Take 1 tablet (325 mg total) by mouth daily with breakfast. 30 tablet 0   fluticasone (FLONASE) 50 MCG/ACT nasal spray Place 2 sprays into both nostrils daily. 16 g 1   furosemide (LASIX) 20 MG tablet TAKE 1 TABLET BY MOUTH EVERY DAY AS NEEDED AS DIRECTED 30 tablet 1   gabapentin (NEURONTIN) 100 MG capsule TAKE 1 CAPSULE BY MOUTH AT BEDTIME. 90 capsule 0   isosorbide mononitrate (IMDUR) 30 MG 24 hr tablet Take 1 tablet (30 mg total) by mouth daily. 90 tablet 2   KLOR-CON M10 10 MEQ tablet TAKE 1 TABLET BY MOUTH EVERY DAY 90 tablet 1   levothyroxine (SYNTHROID) 125 MCG tablet TAKE 1 TABLET BY MOUTH DAILY BEFORE BREAKFAST. 30 tablet 3   nitroGLYCERIN (NITROSTAT) 0.4 MG SL tablet Place 1 tablet (0.4 mg total) under the tongue every 5 (five) minutes x 3 doses as needed. 25 tablet 3   Omega-3 Fatty Acids 300 MG CAPS Take 1 capsule (300 mg total) by mouth 2 (two) times daily.     palbociclib (IBRANCE) 75 MG tablet Take 1 tablet (75 mg total) by mouth daily. Take for 21 days on, 7 days off, repeat every 28 days. 21 tablet 3   pantoprazole (PROTONIX) 40 MG tablet Take 1 tablet (40 mg total) by mouth daily. 90 tablet 1   prochlorperazine (COMPAZINE) 10 MG tablet Take 1 tablet (10 mg total) by mouth every 6 (six) hours as needed for nausea or vomiting. 60 tablet 4   rosuvastatin (CRESTOR) 40 MG tablet Take 1 tablet (40 mg total) by mouth daily. 90 tablet 2   traMADol (ULTRAM) 50 MG tablet Take 1 tablet (50 mg total) by mouth every 8 (eight) hours as needed. 90 tablet 0   No current facility-administered medications for this visit.   Allergies:  Codeine   ROS: No palpitations.  Physical Exam: VS:  BP (!) 144/64   Pulse 63   Ht '5\' 6"'$  (1.676 m)   Wt 200 lb (90.7 kg)   SpO2 96%   BMI 32.28 kg/m , BMI Body mass index is 32.28 kg/m.  Wt Readings from Last 3 Encounters:  11/06/21 200 lb (90.7 kg)  10/28/21 201 lb (91.2 kg)  10/24/21 201 lb 9.6 oz (91.4 kg)    General: Patient  appears comfortable at rest.  In wheelchair today. HEENT: Conjunctiva and lids normal. Neck: Supple, no elevated JVP or carotid bruits. Lungs: Clear to auscultation, nonlabored breathing at rest. Cardiac: Regular rate and rhythm, no S3 or significant systolic murmur. Extremities: Chronic appearing lymphedema with compression stockings in place bilaterally.  ECG:  An ECG dated 06/21/2020 was personally reviewed today and demonstrated:  Sinus rhythm with left anterior fascicular block rule out old inferior infarct  pattern.  Recent Labwork: 06/24/2021: TSH 0.694 10/28/2021: ALT 14; AST 24; BUN 17; Creatinine, Ser 1.09; Hemoglobin 7.6; Magnesium 2.1; Platelets 194; Potassium 3.4; Sodium 137     Component Value Date/Time   CHOL 145 03/01/2020 1008   TRIG 109 03/01/2020 1008   HDL 46 (L) 03/01/2020 1008   CHOLHDL 3.2 03/01/2020 1008   VLDL 20 04/08/2016 0910   LDLCALC 80 03/01/2020 1008    Other Studies Reviewed Today:  Echocardiogram 05/02/2020:  1. Left ventricular ejection fraction, by estimation, is 60 to 65%. The  left ventricle has normal function. The left ventricle has no regional  wall motion abnormalities. Left ventricular diastolic parameters are  consistent with Grade I diastolic  dysfunction (impaired relaxation).   2. Right ventricular systolic function is normal. The right ventricular  size is normal.   3. Left atrial size was mildly dilated.   4. The mitral valve is abnormal. No evidence of mitral valve  regurgitation. No evidence of mitral stenosis. Moderate mitral annular  calcification.   5. The aortic valve is tricuspid. There is mild calcification of the  aortic valve. Aortic valve regurgitation is not visualized. Mild to  moderate aortic valve sclerosis/calcification is present, without any  evidence of aortic stenosis.   6. The inferior vena cava is normal in size with greater than 50%  respiratory variability, suggesting right atrial pressure of 3 mmHg.    Assessment and Plan:  1.  HFrecEF with history of stress-induced cardiomyopathy and normalization of LVEF by follow-up echocardiogram in April of last year.  She is clinically stable.  Current medical regimen includes Coreg and as needed use of Lasix.  2.  CAD status post DES to the second diagonal in 2012.  She does not describe any angina at this time.  Remains on low-dose aspirin, Imdur and Crestor.  As needed nitroglycerin available.  3.  History of fairly extensive left upper extremity DVT, on chronic anticoagulation with Eliquis per oncology.  4.  Left-sided breast cancer with metastasis to bone.  Continues to follow with Dr. Delton Coombes.  Medication Adjustments/Labs and Tests Ordered: Current medicines are reviewed at length with the patient today.  Concerns regarding medicines are outlined above.   Tests Ordered: Orders Placed This Encounter  Procedures   EKG 12-Lead    Medication Changes: No orders of the defined types were placed in this encounter.   Disposition:  Follow up  6 months.  Signed, Satira Sark, MD, [facility] 11/06/2021 10:50 AM    [facility] [address], [place], [place] [address] Phone: [phone]; Fax: [phone]

## 2021-11-07 ENCOUNTER — Inpatient Hospital Stay: Payer: Medicare PPO

## 2021-11-07 VITALS — BP 155/55 | HR 58 | Temp 96.7°F | Resp 20

## 2021-11-07 DIAGNOSIS — K5900 Constipation, unspecified: Secondary | ICD-10-CM | POA: Diagnosis not present

## 2021-11-07 DIAGNOSIS — C7951 Secondary malignant neoplasm of bone: Secondary | ICD-10-CM | POA: Diagnosis not present

## 2021-11-07 DIAGNOSIS — Z79899 Other long term (current) drug therapy: Secondary | ICD-10-CM | POA: Diagnosis not present

## 2021-11-07 DIAGNOSIS — Z7982 Long term (current) use of aspirin: Secondary | ICD-10-CM | POA: Diagnosis not present

## 2021-11-07 DIAGNOSIS — C50912 Malignant neoplasm of unspecified site of left female breast: Secondary | ICD-10-CM | POA: Diagnosis not present

## 2021-11-07 DIAGNOSIS — Z170 Estrogen receptor positive status [ER+]: Secondary | ICD-10-CM | POA: Diagnosis not present

## 2021-11-07 DIAGNOSIS — D649 Anemia, unspecified: Secondary | ICD-10-CM | POA: Diagnosis not present

## 2021-11-07 DIAGNOSIS — Z79811 Long term (current) use of aromatase inhibitors: Secondary | ICD-10-CM | POA: Diagnosis not present

## 2021-11-07 DIAGNOSIS — Z86718 Personal history of other venous thrombosis and embolism: Secondary | ICD-10-CM | POA: Diagnosis not present

## 2021-11-07 MED ORDER — SODIUM CHLORIDE 0.9 % IV SOLN: Freq: Once | INTRAVENOUS | Status: AC

## 2021-11-07 MED ORDER — ACETAMINOPHEN 325 MG PO TABS
650.0000 mg | ORAL_TABLET | Freq: Once | ORAL | Status: AC
  Administered 2021-11-07: 650 mg via ORAL
  Filled 2021-11-07: qty 2

## 2021-11-07 MED ORDER — LORATADINE 10 MG PO TABS
10.0000 mg | ORAL_TABLET | Freq: Once | ORAL | Status: AC
  Administered 2021-11-07: 10 mg via ORAL
  Filled 2021-11-07: qty 1

## 2021-11-07 MED ORDER — SODIUM CHLORIDE 0.9 % IV SOLN
300.0000 mg | Freq: Once | INTRAVENOUS | Status: AC
  Administered 2021-11-07: 300 mg via INTRAVENOUS
  Filled 2021-11-07: qty 10

## 2021-11-07 NOTE — Progress Notes (Signed)
Patient presents today for Venofer infusion per providers order.  Vital signs WNL.  Patient has no new complaints at this time.  Treatment given today per MD orders.  Stable during infusion without adverse affects.  Vital signs stable.  No complaints at this time.  Discharge from clinic ambulatory in stable condition.  Alert and oriented X 3.  Follow up with [facility] as scheduled.

## 2021-11-07 NOTE — Patient Instructions (Signed)
[facility] [place]  Discharge Instructions: Thank you for choosing [facility] to provide your oncology and hematology care.  If you have a lab appointment with the Cancer Center, please come in thru the Main Entrance and check in at the main information desk.  Wear comfortable clothing and clothing appropriate for easy access to any Portacath or PICC line.   We strive to give you quality time with your provider. You may need to reschedule your appointment if you arrive late (15 or more minutes).  Arriving late affects you and other patients whose appointments are after yours.  Also, if you miss three or more appointments without notifying the office, you may be dismissed from the clinic at the provider's discretion.      For prescription refill requests, have your pharmacy contact our office and allow 72 hours for refills to be completed.    Today you received the following chemotherapy and/or immunotherapy agents Venofer      To help prevent nausea and vomiting after your treatment, we encourage you to take your nausea medication as directed.  BELOW ARE SYMPTOMS THAT SHOULD BE REPORTED IMMEDIATELY: *FEVER GREATER THAN 100.4 F (38 C) OR HIGHER *CHILLS OR SWEATING *NAUSEA AND VOMITING THAT IS NOT CONTROLLED WITH YOUR NAUSEA MEDICATION *UNUSUAL SHORTNESS OF BREATH *UNUSUAL BRUISING OR BLEEDING *URINARY PROBLEMS (pain or burning when urinating, or frequent urination) *BOWEL PROBLEMS (unusual diarrhea, constipation, pain near the anus) TENDERNESS IN MOUTH AND THROAT WITH OR WITHOUT PRESENCE OF ULCERS (sore throat, sores in mouth, or a toothache) UNUSUAL RASH, SWELLING OR PAIN  UNUSUAL VAGINAL DISCHARGE OR ITCHING   Items with * indicate a potential emergency and should be followed up as soon as possible or go to the Emergency Department if any problems should occur.  Please show the CHEMOTHERAPY ALERT CARD or IMMUNOTHERAPY ALERT CARD at check-in to the Emergency  Department and triage nurse.  Should you have questions after your visit or need to cancel or reschedule your appointment, please contact [facility] [place] [phone]  and follow the prompts.  Office hours are 8:00 a.m. to 4:30 p.m. Monday - Friday. Please note that voicemails left after 4:00 p.m. may not be returned until the following business day.  We are closed weekends and major holidays. You have access to a nurse at all times for urgent questions. Please call the main number to the clinic [phone] and follow the prompts.  For any non-urgent questions, you may also contact your provider using MyChart. We now offer e-Visits for anyone 18 and older to request care online for non-urgent symptoms. For details visit [email or web address].   Also download the MyChart app! Go to the app store, search "MyChart", open the app, select [facility], and log in with your MyChart username and password.  Masks are optional in the cancer centers. If you would like for your care team to wear a mask while they are taking care of you, please let them know. You may have one support person who is at least 83 years old accompany you for your appointments.  

## 2021-11-14 ENCOUNTER — Inpatient Hospital Stay: Payer: Medicare PPO

## 2021-11-14 ENCOUNTER — Ambulatory Visit (HOSPITAL_COMMUNITY)
Admission: RE | Admit: 2021-11-14 | Discharge: 2021-11-14 | Disposition: A | Discharge status: Home / Self Care | Payer: Medicare PPO | Source: Ambulatory Visit | Attending: Hematology | Admitting: Hematology

## 2021-11-14 VITALS — BP 145/68 | HR 66 | Temp 97.9°F | Resp 16

## 2021-11-14 DIAGNOSIS — C7951 Secondary malignant neoplasm of bone: Secondary | ICD-10-CM

## 2021-11-14 DIAGNOSIS — Z79811 Long term (current) use of aromatase inhibitors: Secondary | ICD-10-CM | POA: Diagnosis not present

## 2021-11-14 DIAGNOSIS — M7989 Other specified soft tissue disorders: Secondary | ICD-10-CM | POA: Insufficient documentation

## 2021-11-14 DIAGNOSIS — D649 Anemia, unspecified: Secondary | ICD-10-CM | POA: Diagnosis not present

## 2021-11-14 DIAGNOSIS — Z7982 Long term (current) use of aspirin: Secondary | ICD-10-CM | POA: Diagnosis not present

## 2021-11-14 DIAGNOSIS — Z86718 Personal history of other venous thrombosis and embolism: Secondary | ICD-10-CM | POA: Diagnosis not present

## 2021-11-14 DIAGNOSIS — K5900 Constipation, unspecified: Secondary | ICD-10-CM | POA: Diagnosis not present

## 2021-11-14 DIAGNOSIS — C50912 Malignant neoplasm of unspecified site of left female breast: Secondary | ICD-10-CM | POA: Diagnosis not present

## 2021-11-14 DIAGNOSIS — Z79899 Other long term (current) drug therapy: Secondary | ICD-10-CM | POA: Diagnosis not present

## 2021-11-14 DIAGNOSIS — R600 Localized edema: Secondary | ICD-10-CM | POA: Diagnosis not present

## 2021-11-14 DIAGNOSIS — Z170 Estrogen receptor positive status [ER+]: Secondary | ICD-10-CM | POA: Diagnosis not present

## 2021-11-14 MED ORDER — SODIUM CHLORIDE 0.9 % IV SOLN: Freq: Once | INTRAVENOUS | Status: AC

## 2021-11-14 MED ORDER — SODIUM CHLORIDE 0.9 % IV SOLN
300.0000 mg | Freq: Once | INTRAVENOUS | Status: AC
  Administered 2021-11-14: 300 mg via INTRAVENOUS
  Filled 2021-11-14: qty 5

## 2021-11-14 MED ORDER — LORATADINE 10 MG PO TABS
10.0000 mg | ORAL_TABLET | Freq: Once | ORAL | Status: AC
  Administered 2021-11-14: 10 mg via ORAL
  Filled 2021-11-14: qty 1

## 2021-11-14 MED ORDER — ACETAMINOPHEN 325 MG PO TABS
650.0000 mg | ORAL_TABLET | Freq: Once | ORAL | Status: AC
  Administered 2021-11-14: 650 mg via ORAL
  Filled 2021-11-14: qty 2

## 2021-11-14 NOTE — Progress Notes (Signed)
Patient presents today for iron infusion.  Patient is in satisfactory condition with only complaints of L arm/hand swelling since Monday.  MD was made aware.  Patient will have an ultrasound today after iron infusion.  Vital signs are stable.  We will proceed with treatment per MD orders.   Patient tolerated infusion well with no complaints voiced.  Patient left via wheelchair with daughter in stable condition.  Vital signs stable at discharge.  Follow up as scheduled.

## 2021-11-14 NOTE — Patient Instructions (Signed)
[facility] [place]  Discharge Instructions: Thank you for choosing [facility] to provide your oncology and hematology care.  If you have a lab appointment with the Cancer Center, please come in thru the Main Entrance and check in at the main information desk.  Wear comfortable clothing and clothing appropriate for easy access to any Portacath or PICC line.   We strive to give you quality time with your provider. You may need to reschedule your appointment if you arrive late (15 or more minutes).  Arriving late affects you and other patients whose appointments are after yours.  Also, if you miss three or more appointments without notifying the office, you may be dismissed from the clinic at the provider's discretion.      For prescription refill requests, have your pharmacy contact our office and allow 72 hours for refills to be completed.     To help prevent nausea and vomiting after your treatment, we encourage you to take your nausea medication as directed.  BELOW ARE SYMPTOMS THAT SHOULD BE REPORTED IMMEDIATELY: *FEVER GREATER THAN 100.4 F (38 C) OR HIGHER *CHILLS OR SWEATING *NAUSEA AND VOMITING THAT IS NOT CONTROLLED WITH YOUR NAUSEA MEDICATION *UNUSUAL SHORTNESS OF BREATH *UNUSUAL BRUISING OR BLEEDING *URINARY PROBLEMS (pain or burning when urinating, or frequent urination) *BOWEL PROBLEMS (unusual diarrhea, constipation, pain near the anus) TENDERNESS IN MOUTH AND THROAT WITH OR WITHOUT PRESENCE OF ULCERS (sore throat, sores in mouth, or a toothache) UNUSUAL RASH, SWELLING OR PAIN  UNUSUAL VAGINAL DISCHARGE OR ITCHING   Items with * indicate a potential emergency and should be followed up as soon as possible or go to the Emergency Department if any problems should occur.  Please show the CHEMOTHERAPY ALERT CARD or IMMUNOTHERAPY ALERT CARD at check-in to the Emergency Department and triage nurse.  Should you have questions after your visit or need to  cancel or reschedule your appointment, please contact [facility] [place] [phone]  and follow the prompts.  Office hours are 8:00 a.m. to 4:30 p.m. Monday - Friday. Please note that voicemails left after 4:00 p.m. may not be returned until the following business day.  We are closed weekends and major holidays. You have access to a nurse at all times for urgent questions. Please call the main number to the clinic [phone] and follow the prompts.  For any non-urgent questions, you may also contact your provider using MyChart. We now offer e-Visits for anyone 18 and older to request care online for non-urgent symptoms. For details visit [email or web address].   Also download the MyChart app! Go to the app store, search "MyChart", open the app, select [facility] [place], and log in with your MyChart username and password.  Masks are optional in the cancer centers. If you would like for your care team to wear a mask while they are taking care of you, please let them know. You may have one support person who is at least 83 years old accompany you for your appointments.  

## 2021-11-15 ENCOUNTER — Telehealth: Payer: Self-pay

## 2021-11-15 ENCOUNTER — Other Ambulatory Visit (HOSPITAL_COMMUNITY): Payer: Self-pay

## 2021-11-15 NOTE — Telephone Encounter (Signed)
Oral Oncology Patient Advocate Encounter   Received notification that patient is due for re-enrollment for assistance for Ibrance through [employer].   Re-enrollment process has been initiated and will be submitted upon completion of necessary documents.  [employer]'s phone number [phone].   I will continue to follow until final determination.  Berdine Addison, [facility] Patient [place]  [phone] (phone) [phone] (fax) 11/15/2021 9:33 AM

## 2021-11-16 ENCOUNTER — Other Ambulatory Visit (HOSPITAL_COMMUNITY): Payer: Self-pay | Admitting: Hematology

## 2021-11-18 ENCOUNTER — Other Ambulatory Visit: Payer: Self-pay | Admitting: Internal Medicine

## 2021-11-22 ENCOUNTER — Inpatient Hospital Stay: Payer: Medicare PPO | Attending: Hematology

## 2021-11-22 VITALS — BP 165/80 | HR 63 | Temp 97.3°F | Resp 18

## 2021-11-22 DIAGNOSIS — Z86718 Personal history of other venous thrombosis and embolism: Secondary | ICD-10-CM | POA: Insufficient documentation

## 2021-11-22 DIAGNOSIS — Z79811 Long term (current) use of aromatase inhibitors: Secondary | ICD-10-CM | POA: Diagnosis not present

## 2021-11-22 DIAGNOSIS — D649 Anemia, unspecified: Secondary | ICD-10-CM | POA: Insufficient documentation

## 2021-11-22 DIAGNOSIS — Z170 Estrogen receptor positive status [ER+]: Secondary | ICD-10-CM | POA: Diagnosis not present

## 2021-11-22 DIAGNOSIS — Z7901 Long term (current) use of anticoagulants: Secondary | ICD-10-CM | POA: Diagnosis not present

## 2021-11-22 DIAGNOSIS — Z79890 Hormone replacement therapy (postmenopausal): Secondary | ICD-10-CM | POA: Diagnosis not present

## 2021-11-22 DIAGNOSIS — C50912 Malignant neoplasm of unspecified site of left female breast: Secondary | ICD-10-CM | POA: Insufficient documentation

## 2021-11-22 DIAGNOSIS — Z9013 Acquired absence of bilateral breasts and nipples: Secondary | ICD-10-CM | POA: Diagnosis not present

## 2021-11-22 DIAGNOSIS — Z801 Family history of malignant neoplasm of trachea, bronchus and lung: Secondary | ICD-10-CM | POA: Diagnosis not present

## 2021-11-22 DIAGNOSIS — Z79899 Other long term (current) drug therapy: Secondary | ICD-10-CM | POA: Insufficient documentation

## 2021-11-22 DIAGNOSIS — C7951 Secondary malignant neoplasm of bone: Secondary | ICD-10-CM | POA: Insufficient documentation

## 2021-11-22 DIAGNOSIS — M7989 Other specified soft tissue disorders: Secondary | ICD-10-CM | POA: Insufficient documentation

## 2021-11-22 DIAGNOSIS — Z7982 Long term (current) use of aspirin: Secondary | ICD-10-CM | POA: Insufficient documentation

## 2021-11-22 DIAGNOSIS — N2889 Other specified disorders of kidney and ureter: Secondary | ICD-10-CM | POA: Insufficient documentation

## 2021-11-22 MED ORDER — ACETAMINOPHEN 325 MG PO TABS
650.0000 mg | ORAL_TABLET | Freq: Once | ORAL | Status: AC
  Administered 2021-11-22: 650 mg via ORAL
  Filled 2021-11-22: qty 2

## 2021-11-22 MED ORDER — LORATADINE 10 MG PO TABS
10.0000 mg | ORAL_TABLET | Freq: Once | ORAL | Status: AC
  Administered 2021-11-22: 10 mg via ORAL
  Filled 2021-11-22: qty 1

## 2021-11-22 MED ORDER — SODIUM CHLORIDE 0.9 % IV SOLN
400.0000 mg | Freq: Once | INTRAVENOUS | Status: AC
  Administered 2021-11-22: 400 mg via INTRAVENOUS
  Filled 2021-11-22: qty 20

## 2021-11-22 MED ORDER — SODIUM CHLORIDE 0.9 % IV SOLN: Freq: Once | INTRAVENOUS | Status: AC

## 2021-11-22 NOTE — Telephone Encounter (Signed)
Oral Oncology Patient Advocate Encounter   Submitted application for assistance for Ibrance to [employer].   Application submitted via e-fax to [phone]   [employer]'s phone number [phone].   I will continue to check the status until final determination.   Berdine Addison, [facility] Patient [place]  [phone] (phone) [phone] (fax) 11/22/2021 10:05 AM

## 2021-11-22 NOTE — Patient Instructions (Signed)
[facility] [place]  Discharge Instructions: Thank you for choosing [facility] to provide your oncology and hematology care.  If you have a lab appointment with the Cancer Center, please come in thru the Main Entrance and check in at the main information desk.  Wear comfortable clothing and clothing appropriate for easy access to any Portacath or PICC line.   We strive to give you quality time with your provider. You may need to reschedule your appointment if you arrive late (15 or more minutes).  Arriving late affects you and other patients whose appointments are after yours.  Also, if you miss three or more appointments without notifying the office, you may be dismissed from the clinic at the provider's discretion.      For prescription refill requests, have your pharmacy contact our office and allow 72 hours for refills to be completed.     To help prevent nausea and vomiting after your treatment, we encourage you to take your nausea medication as directed.  BELOW ARE SYMPTOMS THAT SHOULD BE REPORTED IMMEDIATELY: *FEVER GREATER THAN 100.4 F (38 C) OR HIGHER *CHILLS OR SWEATING *NAUSEA AND VOMITING THAT IS NOT CONTROLLED WITH YOUR NAUSEA MEDICATION *UNUSUAL SHORTNESS OF BREATH *UNUSUAL BRUISING OR BLEEDING *URINARY PROBLEMS (pain or burning when urinating, or frequent urination) *BOWEL PROBLEMS (unusual diarrhea, constipation, pain near the anus) TENDERNESS IN MOUTH AND THROAT WITH OR WITHOUT PRESENCE OF ULCERS (sore throat, sores in mouth, or a toothache) UNUSUAL RASH, SWELLING OR PAIN  UNUSUAL VAGINAL DISCHARGE OR ITCHING   Items with * indicate a potential emergency and should be followed up as soon as possible or go to the Emergency Department if any problems should occur.  Please show the CHEMOTHERAPY ALERT CARD or IMMUNOTHERAPY ALERT CARD at check-in to the Emergency Department and triage nurse.  Should you have questions after your visit or need to  cancel or reschedule your appointment, please contact [facility] [place] [phone]  and follow the prompts.  Office hours are 8:00 a.m. to 4:30 p.m. Monday - Friday. Please note that voicemails left after 4:00 p.m. may not be returned until the following business day.  We are closed weekends and major holidays. You have access to a nurse at all times for urgent questions. Please call the main number to the clinic [phone] and follow the prompts.  For any non-urgent questions, you may also contact your provider using MyChart. We now offer e-Visits for anyone 18 and older to request care online for non-urgent symptoms. For details visit [email or web address].   Also download the MyChart app! Go to the app store, search "MyChart", open the app, select [facility], and log in with your MyChart username and password.  Masks are optional in the cancer centers. If you would like for your care team to wear a mask while they are taking care of you, please let them know. You may have one support person who is at least 83 years old accompany you for your appointments.  

## 2021-11-22 NOTE — Progress Notes (Signed)
Patient tolerated iron infusion with no complaints voiced.  Peripheral IV site clean and dry with good blood return noted before and after infusion.  Band aid applied.  VSS with discharge and left in satisfactory condition with no s/s of distress noted.   

## 2021-11-25 ENCOUNTER — Inpatient Hospital Stay: Payer: Medicare PPO

## 2021-11-25 ENCOUNTER — Inpatient Hospital Stay (HOSPITAL_BASED_OUTPATIENT_CLINIC_OR_DEPARTMENT_OTHER): Payer: Medicare PPO | Admitting: Hematology

## 2021-11-25 VITALS — BP 159/53 | HR 64 | Temp 97.3°F | Resp 18 | Ht 66.0 in | Wt 194.1 lb

## 2021-11-25 DIAGNOSIS — D649 Anemia, unspecified: Secondary | ICD-10-CM | POA: Diagnosis not present

## 2021-11-25 DIAGNOSIS — C7951 Secondary malignant neoplasm of bone: Secondary | ICD-10-CM

## 2021-11-25 DIAGNOSIS — N2889 Other specified disorders of kidney and ureter: Secondary | ICD-10-CM | POA: Diagnosis not present

## 2021-11-25 DIAGNOSIS — D508 Other iron deficiency anemias: Secondary | ICD-10-CM | POA: Diagnosis not present

## 2021-11-25 DIAGNOSIS — Z170 Estrogen receptor positive status [ER+]: Secondary | ICD-10-CM | POA: Diagnosis not present

## 2021-11-25 DIAGNOSIS — Z79811 Long term (current) use of aromatase inhibitors: Secondary | ICD-10-CM | POA: Diagnosis not present

## 2021-11-25 DIAGNOSIS — C50912 Malignant neoplasm of unspecified site of left female breast: Secondary | ICD-10-CM | POA: Diagnosis not present

## 2021-11-25 DIAGNOSIS — Z9013 Acquired absence of bilateral breasts and nipples: Secondary | ICD-10-CM | POA: Diagnosis not present

## 2021-11-25 DIAGNOSIS — Z79899 Other long term (current) drug therapy: Secondary | ICD-10-CM | POA: Diagnosis not present

## 2021-11-25 DIAGNOSIS — M7989 Other specified soft tissue disorders: Secondary | ICD-10-CM | POA: Diagnosis not present

## 2021-11-25 LAB — COMPREHENSIVE METABOLIC PANEL
ALT: 19 U/L (ref 0–44)
AST: 29 U/L (ref 15–41)
Albumin: 3.6 g/dL (ref 3.5–5.0)
Alkaline Phosphatase: 209 U/L — ABNORMAL HIGH (ref 38–126)
Anion gap: 5 (ref 5–15)
BUN: 16 mg/dL (ref 8–23)
CO2: 22 mmol/L (ref 22–32)
Calcium: 8.7 mg/dL — ABNORMAL LOW (ref 8.9–10.3)
Chloride: 112 mmol/L — ABNORMAL HIGH (ref 98–111)
Creatinine, Ser: 1.16 mg/dL — ABNORMAL HIGH (ref 0.44–1.00)
GFR, Estimated: 47 mL/min — ABNORMAL LOW (ref >60)
Glucose, Bld: 100 mg/dL — ABNORMAL HIGH (ref 70–99)
Potassium: 3.7 mmol/L (ref 3.5–5.1)
Sodium: 139 mmol/L (ref 135–145)
Total Bilirubin: 0.5 mg/dL (ref 0.3–1.2)
Total Protein: 6.8 g/dL (ref 6.5–8.1)

## 2021-11-25 LAB — CBC WITH DIFFERENTIAL/PLATELET
Abs Immature Granulocytes: 0.01 10*3/uL (ref 0.00–0.07)
Basophils Absolute: 0 10*3/uL (ref 0.0–0.1)
Basophils Relative: 1 %
Eosinophils Absolute: 0 10*3/uL (ref 0.0–0.5)
Eosinophils Relative: 1 %
HCT: 24.4 % — ABNORMAL LOW (ref 36.0–46.0)
Hemoglobin: 7.4 g/dL — ABNORMAL LOW (ref 12.0–15.0)
Immature Granulocytes: 1 %
Lymphocytes Relative: 32 %
Lymphs Abs: 0.6 10*3/uL — ABNORMAL LOW (ref 0.7–4.0)
MCH: 28.1 pg (ref 26.0–34.0)
MCHC: 30.3 g/dL (ref 30.0–36.0)
MCV: 92.8 fL (ref 80.0–100.0)
Monocytes Absolute: 0.1 10*3/uL (ref 0.1–1.0)
Monocytes Relative: 7 %
Neutro Abs: 1 10*3/uL — ABNORMAL LOW (ref 1.7–7.7)
Neutrophils Relative %: 58 %
Platelets: 102 10*3/uL — ABNORMAL LOW (ref 150–400)
RBC: 2.63 MIL/uL — ABNORMAL LOW (ref 3.87–5.11)
RDW: 32 % — ABNORMAL HIGH (ref 11.5–15.5)
WBC: 1.8 10*3/uL — ABNORMAL LOW (ref 4.0–10.5)
nRBC: 0 % (ref 0.0–0.2)

## 2021-11-25 LAB — MAGNESIUM: Magnesium: 2.1 mg/dL (ref 1.7–2.4)

## 2021-11-25 MED ORDER — EPOETIN ALFA-EPBX 40000 UNIT/ML IJ SOLN: 30000.0000 [IU] | Freq: Once | INTRAMUSCULAR | Status: DC

## 2021-11-25 MED ORDER — DENOSUMAB 120 MG/1.7ML ~~LOC~~ SOLN
120.0000 mg | Freq: Once | SUBCUTANEOUS | Status: AC
  Administered 2021-11-25: 120 mg via SUBCUTANEOUS
  Filled 2021-11-25: qty 1.7

## 2021-11-25 MED ORDER — MISC. DEVICES MISC: 0 refills | Status: DC

## 2021-11-25 MED ORDER — EPOETIN ALFA-EPBX 10000 UNIT/ML IJ SOLN
10000.0000 [IU] | Freq: Once | INTRAMUSCULAR | Status: AC
  Administered 2021-11-25: 10000 [IU] via SUBCUTANEOUS
  Filled 2021-11-25: qty 1

## 2021-11-25 MED ORDER — EPOETIN ALFA-EPBX 20000 UNIT/ML IJ SOLN
20000.0000 [IU] | Freq: Once | INTRAMUSCULAR | Status: AC
  Administered 2021-11-25: 20000 [IU] via SUBCUTANEOUS
  Filled 2021-11-25: qty 1

## 2021-11-25 NOTE — Patient Instructions (Signed)
[place] at [facility] Discharge Instructions   You were seen and examined today by Dr. Delton Coombes.  He reviewed the results of your lab work. Your hemoglobin has not improved after the iron infusions. Dr. Raliegh Ip discussed starting you on injections to help boost your hemoglobin.   Once we get the hemoglobin up, he will discuss increasing the dose of Ibrance.   Continue Ibrance as prescribed.   Return as scheduled.    Thank you for choosing [place] at [facility] to provide your oncology and hematology care.  To afford each patient quality time with our provider, please arrive at least 15 minutes before your scheduled appointment time.   If you have a lab appointment with the [facility] please come in thru the Main Entrance and check in at the main information desk.  You need to re-schedule your appointment should you arrive 10 or more minutes late.  We strive to give you quality time with our providers, and arriving late affects you and other patients whose appointments are after yours.  Also, if you no show three or more times for appointments you may be dismissed from the clinic at the providers discretion.     Again, thank you for choosing [facility].  Our hope is that these requests will decrease the amount of time that you wait before being seen by our physicians.       _____________________________________________________________  Should you have questions after your visit to [facility], please contact our office at [phone] and follow the prompts.  Our office hours are 8:00 a.m. and 4:30 p.m. Monday - Friday.  Please note that voicemails left after 4:00 p.m. may not be returned until the following business day.  We are closed weekends and major holidays.  You do have access to a nurse 24-7, just call the main number to the clinic [phone] and do not press any options, hold on the line and a nurse  will answer the phone.    For prescription refill requests, have your pharmacy contact our office and allow 72 hours.    Due to Covid, you will need to wear a mask upon entering the hospital. If you do not have a mask, a mask will be given to you at the Main Entrance upon arrival. For doctor visits, patients may have 1 support person age 40 or older with them. For treatment visits, patients can not have anyone with them due to social distancing guidelines and our immunocompromised population.

## 2021-11-25 NOTE — Progress Notes (Signed)
[facility] [address], [place] [address]   Patient Care Team: [name], MD as PCP - General (Internal Medicine) Satira Sark, MD as PCP - Cardiology (Cardiology) Brien Mates, RN as Oncology Nurse Navigator (Oncology) Derek Jack, MD as Medical Oncologist (Medical Oncology)  CHIEF COMPLIANT: Follow-up of left breast cancer with metastases to bones   INTERVAL HISTORY: [name] is a 81 y.o. female seen for follow-up of her metastatic breast cancer to the bones.  She is continuing anastrozole and Ibrance 75 mg 3 weeks on/1 week off.  She is taking her second bottle of Ibrance.  Denies any GI side effects.  Complains of left hand being swollen.  Energy levels are 75%.  REVIEW OF SYSTEMS:   Review of Systems  Cardiovascular:  Positive for leg swelling (Chronic).  Musculoskeletal:  Negative for back pain.  All other systems reviewed and are negative.   I have reviewed the past medical history, past surgical history, social history and family history with the patient and they are unchanged from previous note.   ALLERGIES:   is allergic to codeine.   MEDICATIONS:  Current Outpatient Medications  Medication Sig Dispense Refill   acetaminophen (TYLENOL) 325 MG tablet Take 2 tablets (650 mg total) by mouth every 6 (six) hours as needed for mild pain, fever or headache (or Fever >/= 101). 12 tablet 0   ALPRAZolam (XANAX) 0.25 MG tablet Take 1 tablet (0.25 mg total) by mouth 2 (two) times daily as needed for anxiety. 60 tablet 0   amLODipine (NORVASC) 10 MG tablet Take 1 tablet (10 mg total) by mouth daily. 90 tablet 3   anastrozole (ARIMIDEX) 1 MG tablet TAKE 1 TABLET BY MOUTH EVERY DAY 30 tablet 3   aspirin 81 MG tablet Take 1 tablet (81 mg total) by mouth daily with breakfast. 30 tablet 3   carvedilol (COREG) 12.5 MG tablet TAKE 1 TABLET (12.5MG TOTAL) BY MOUTH TWICE A DAY WITH MEALS 90 tablet 3   cetirizine (ZYRTEC) 10 MG  tablet Take 1 tablet (10 mg total) by mouth daily. 30 tablet 0   Cholecalciferol (VITAMIN D3) 125 MCG (5000 UT) CAPS TAKE 1 CAPSULE BY MOUTH ONCE DAILY (Patient taking differently: Take 1 capsule by mouth daily.) 90 capsule 1   dicyclomine (BENTYL) 10 MG capsule Take 1 capsule (10 mg total) by mouth 3 (three) times daily as needed for spasms. 20 capsule 0   ELIQUIS 5 MG TABS tablet TAKE 1 TABLET BY MOUTH TWICE A DAY 60 tablet 3   ferrous sulfate 325 (65 FE) MG tablet Take 1 tablet (325 mg total) by mouth daily with breakfast. 30 tablet 0   fluticasone (FLONASE) 50 MCG/ACT nasal spray Place 2 sprays into both nostrils daily. 16 g 1   furosemide (LASIX) 20 MG tablet TAKE 1 TABLET BY MOUTH EVERY DAY AS NEEDED AS DIRECTED 30 tablet 1   gabapentin (NEURONTIN) 100 MG capsule TAKE 1 CAPSULE BY MOUTH AT BEDTIME. 90 capsule 0   isosorbide mononitrate (IMDUR) 30 MG 24 hr tablet Take 1 tablet (30 mg total) by mouth daily. 90 tablet 2   KLOR-CON M10 10 MEQ tablet TAKE 1 TABLET BY MOUTH EVERY DAY 90 tablet 1   levothyroxine (SYNTHROID) 125 MCG tablet TAKE 1 TABLET BY MOUTH DAILY BEFORE BREAKFAST. 30 tablet 3   nitroGLYCERIN (NITROSTAT) 0.4 MG SL tablet Place 1 tablet (0.4 mg total) under the tongue every 5 (five) minutes x 3  doses as needed. 25 tablet 3   Omega-3 Fatty Acids 300 MG CAPS Take 1 capsule (300 mg total) by mouth 2 (two) times daily.     palbociclib (IBRANCE) 75 MG tablet Take 1 tablet (75 mg total) by mouth daily. Take for 21 days on, 7 days off, repeat every 28 days. 21 tablet 3   pantoprazole (PROTONIX) 40 MG tablet Take 1 tablet (40 mg total) by mouth daily. 90 tablet 1   prochlorperazine (COMPAZINE) 10 MG tablet Take 1 tablet (10 mg total) by mouth every 6 (six) hours as needed for nausea or vomiting. 60 tablet 4   rosuvastatin (CRESTOR) 40 MG tablet Take 1 tablet (40 mg total) by mouth daily. 90 tablet 2   traMADol (ULTRAM) 50 MG tablet Take 1 tablet (50 mg total) by mouth every 8 (eight) hours  as needed. 90 tablet 0   No current facility-administered medications for this visit.     PHYSICAL EXAMINATION: Performance status (ECOG): 2 - Symptomatic, <50% confined to bed  There were no vitals filed for this visit. Wt Readings from Last 3 Encounters:  11/06/21 200 lb (90.7 kg)  10/28/21 201 lb (91.2 kg)  10/24/21 201 lb 9.6 oz (91.4 kg)   Physical Exam Vitals reviewed.  Constitutional:      Appearance: Normal appearance. She is obese.     Comments: In wheelchair  Cardiovascular:     Rate and Rhythm: Normal rate and regular rhythm.     Pulses: Normal pulses.     Heart sounds: Normal heart sounds.  Pulmonary:     Effort: Pulmonary effort is normal.     Breath sounds: Normal breath sounds.  Neurological:     General: No focal deficit present.     Mental Status: She is alert and oriented to person, place, and time.  Psychiatric:        Mood and Affect: Mood normal.        Behavior: Behavior normal.     Breast Exam Chaperone: Thana Ates     LABORATORY DATA:  I have reviewed the data as listed    Latest Ref Rng & Units 10/28/2021    8:34 AM 09/25/2021   12:48 PM 08/15/2021   10:24 AM  CMP  Glucose 70 - 99 mg/dL 100  98  107   BUN 8 - 23 mg/dL _0 Creatinine 0.44 - 1.00 mg/dL 1.09  1.11  1.12   Sodium 135 - 145 mmol/L 137  138  139   Potassium 3.5 - 5.1 mmol/L 3.4  3.7  3.4   Chloride 98 - 111 mmol/L 112  110  108   CO2 22 - 32 mmol/L _1 Calcium 8.9 - 10.3 mg/dL 8.8  9.5  9.5   Total Protein 6.5 - 8.1 g/dL 6.7  7.2  6.9   Total Bilirubin 0.3 - 1.2 mg/dL 0.7  0.7  0.6   Alkaline Phos 38 - 126 U/L 266  188  176   AST 15 - 41 U/L 24  35  30   ALT 0 - 44 U/L _2 Lab Results  Component Value Date   CAN153 87.0 (H) 10/28/2021   CAN153 112.0 (H) 08/15/2021   CAN153 73.7 (H) 06/18/2021   Lab Results  Component Value Date   WBC 2.4 (L) 10/28/2021   HGB 7.6 (L) 10/28/2021   HCT 25.1 (L) 10/28/2021   MCV  86.0 10/28/2021   PLT  194 10/28/2021   NEUTROABS 1.2 (L) 10/28/2021    ASSESSMENT:  1.  Recurrent breast carcinoma with metastases to bones: -History of right breast cancer in 1987, status post right mastectomy requiring no adjuvant chemo or XRT.  Was not on tamoxifen. -History of left breast cancer in 2007, status post mastectomy by Dr. Carlis Abbott at [facility], placed on tamoxifen, discontinued around 2015. -Recent upper back pain, weight loss of 20 pounds in the last 3 to 6 months. -CT angio on 04/04/2020 showed irregular mass in the left chest wall concerning for recurrent breast cancer measuring 4.5 x 3.1 cm. -Also showed lytic lesions in T1 and T3 vertebral bodies as well as manubrium. -Left chest wall mass biopsy on 04/05/2020 consistent with adenocarcinoma compatible with recurrent breast carcinoma.  ER 95% positive, PR 40% positive, HER2 2+, negative by FISH. - PET scan on 05/15/2020 shows hypermetabolic mass in the left anterior chest wall, mild hypermetabolic thoracic lymphadenopathy, diffuse bone metastasis throughout the spine, sternum, both shoulders, pelvis, proximal right femur which are both lytic and sclerotic. - Anastrozole started around 05/21/2020.  She never started palbociclib even though it was recommended at that time. - Bone scan (07/09/2021): Increased uptake over the right frontal bone, cervical, thoracic and lumbar spine.  Multifocal areas of increased activity noted throughout the sacrum and pelvis.  Increased activity noted over the sternum.  Increased activity noted over the proximal right humerus and over the lateral aspect of the left scapula.  Focal area of increased activity noted over the proximal right femur. - Took Ibrance 100 mg from 06/28/2021 through 07/04/2021, discontinued secondary to nervousness, nausea and vomiting. - Ibrance 75 mg daily started on 09/25/2021   2.  Left upper extremity DVT: -Left upper extremity Doppler on 04/04/2020 + for DVT involving left internal jugular  vein, left subclavian vein, left axillary vein, left brachial and left basilic vein.   3.  Family history: - No major family history of malignancies other than her brother with throat cancer.  4.  Left kidney mass: - Last CT scan showed 2.1 x 1.7 cm exophytic soft tissue lesion in the left lateral inferior pole of the left kidney.  It will be followed on subsequent scans.   PLAN:  1.  Metastatic breast cancer to the bones, ER/PR positive: - She is taking Ibrance 75 mg 3 weeks on/1 week off, second bottle.  She will complete it on 11/29/2021. - She is tolerating it very well. - Reviewed labs today which showed alk phos elevated at 209.  Rest of LFTs are normal.  Creatinine is 1.16 and stable.  White count is low at 1.8 with ANC of 1000.  Hemoglobin is 7.4. - Recommend continuing Ibrance.  Will not dose increase of Ibrance at this time because of anemia.  RTC 4 weeks for follow-up.  We will check tumor markers.   2.  Left upper extremity DVT in the setting of malignancy: - Continue Eliquis 5 mg twice daily. - Left upper extremity Doppler was negative.  Will prescribe lymphedema sleeve.   3.  Right sternal/chest wall pain: - Continue tramadol 50 mg 3 times daily as needed.   4.  Bone metastasis: - Calcium is 8.7 today.  Continue denosumab monthly.  5.  Normocytic anemia: - She has received 3 doses of Venofer.  However hemoglobin is 7.4, down from 7.6 on 10/28/2021. - This is partly due to myelosuppression from [place]. - Talked about starting her on  Retacrit 30,000 units weekly. - We will reevaluate her in 4 weeks with repeat ferritin and iron panel.  Breast Cancer therapy associated bone loss: I have recommended calcium, Vitamin D and weight bearing exercises.  Orders placed this encounter:  No orders of the defined types were placed in this encounter.   The patient has a good understanding of the overall plan. She agrees with it. She will call with any problems that may develop  before the next visit here.  Derek Jack, MD [facility] [phone]

## 2021-11-25 NOTE — Patient Instructions (Signed)
Normal  Discharge Instructions: Thank you for choosing [facility] to provide your oncology and hematology care.  If you have a lab appointment with the [facility], please come in thru the Main Entrance and check in at the main information desk.  Wear comfortable clothing and clothing appropriate for easy access to any Portacath or PICC line.   We strive to give you quality time with your provider. You may need to reschedule your appointment if you arrive late (15 or more minutes).  Arriving late affects you and other patients whose appointments are after yours.  Also, if you miss three or more appointments without notifying the office, you may be dismissed from the clinic at the provider's discretion.      For prescription refill requests, have your pharmacy contact our office and allow 72 hours for refills to be completed.    Today you received the following Xgeva and Retacrit, return as scheduled.   To help prevent nausea and vomiting after your treatment, we encourage you to take your nausea medication as directed.  BELOW ARE SYMPTOMS THAT SHOULD BE REPORTED IMMEDIATELY: *FEVER GREATER THAN 100.4 F (38 C) OR HIGHER *CHILLS OR SWEATING *NAUSEA AND VOMITING THAT IS NOT CONTROLLED WITH YOUR NAUSEA MEDICATION *UNUSUAL SHORTNESS OF BREATH *UNUSUAL BRUISING OR BLEEDING *URINARY PROBLEMS (pain or burning when urinating, or frequent urination) *BOWEL PROBLEMS (unusual diarrhea, constipation, pain near the anus) TENDERNESS IN MOUTH AND THROAT WITH OR WITHOUT PRESENCE OF ULCERS (sore throat, sores in mouth, or a toothache) UNUSUAL RASH, SWELLING OR PAIN  UNUSUAL VAGINAL DISCHARGE OR ITCHING   Items with * indicate a potential emergency and should be followed up as soon as possible or go to the Emergency Department if any problems should occur.  Please show the CHEMOTHERAPY ALERT CARD or IMMUNOTHERAPY ALERT CARD at check-in to the Emergency Department  and triage nurse.  Should you have questions after your visit or need to cancel or reschedule your appointment, please contact [facility] [phone]  and follow the prompts.  Office hours are 8:00 a.m. to 4:30 p.m. Monday - Friday. Please note that voicemails left after 4:00 p.m. may not be returned until the following business day.  We are closed weekends and major holidays. You have access to a nurse at all times for urgent questions. Please call the main number to the clinic [phone] and follow the prompts.  For any non-urgent questions, you may also contact your provider using MyChart. We now offer e-Visits for anyone 66 and older to request care online for non-urgent symptoms. For details visit [email or web address].   Also download the MyChart app! Go to the app store, search "MyChart", open the app, select [facility], and log in with your MyChart username and password.  Masks are optional in the cancer centers. If you would like for your care team to wear a mask while they are taking care of you, please let them know. You may have one support person who is at least 83 years old accompany you for your appointments.

## 2021-11-25 NOTE — Progress Notes (Signed)
Patient is taking Ibrance as prescribed.  She has not missed any doses and reports no side effects at this time.

## 2021-11-25 NOTE — Progress Notes (Signed)
Patient taking calcium as directed. Denied tooth, jaw, and leg pain. No recent or upcoming dental visits. Labs reviewed. Patient tolerated injection with no complaints voiced. See MAR for details. Patient stable during and after injection. Site clean and dry with no bruising or swelling noted. Band aid applied.  Patient presents today for Retacrit injection. Hemoglobin reviewed prior to administration. VSS tolerated without incident or complaint. See MAR for details. Patient stable during and after injection. Patient discharged in satisfactory condition with no s/s of distress noted.

## 2021-11-27 ENCOUNTER — Other Ambulatory Visit: Payer: Self-pay | Admitting: Internal Medicine

## 2021-11-28 ENCOUNTER — Telehealth: Payer: Self-pay | Admitting: *Deleted

## 2021-11-28 ENCOUNTER — Encounter: Payer: Self-pay | Admitting: Internal Medicine

## 2021-11-28 ENCOUNTER — Other Ambulatory Visit: Payer: Self-pay

## 2021-11-28 ENCOUNTER — Inpatient Hospital Stay (HOSPITAL_COMMUNITY)
Admission: EM | Admit: 2021-11-28 | Discharge: 2021-12-03 | DRG: 602 | Disposition: A | Discharge status: Home Health | Payer: Medicare PPO | Attending: Internal Medicine | Admitting: Internal Medicine

## 2021-11-28 ENCOUNTER — Encounter (HOSPITAL_COMMUNITY): Payer: Self-pay | Admitting: Emergency Medicine

## 2021-11-28 ENCOUNTER — Ambulatory Visit (INDEPENDENT_AMBULATORY_CARE_PROVIDER_SITE_OTHER): Payer: Medicare PPO | Admitting: Internal Medicine

## 2021-11-28 DIAGNOSIS — K219 Gastro-esophageal reflux disease without esophagitis: Secondary | ICD-10-CM | POA: Diagnosis present

## 2021-11-28 DIAGNOSIS — Z86718 Personal history of other venous thrombosis and embolism: Secondary | ICD-10-CM | POA: Diagnosis not present

## 2021-11-28 DIAGNOSIS — J301 Allergic rhinitis due to pollen: Secondary | ICD-10-CM

## 2021-11-28 DIAGNOSIS — I10 Essential (primary) hypertension: Secondary | ICD-10-CM | POA: Diagnosis present

## 2021-11-28 DIAGNOSIS — T451X5A Adverse effect of antineoplastic and immunosuppressive drugs, initial encounter: Secondary | ICD-10-CM | POA: Diagnosis present

## 2021-11-28 DIAGNOSIS — R110 Nausea: Secondary | ICD-10-CM

## 2021-11-28 DIAGNOSIS — I252 Old myocardial infarction: Secondary | ICD-10-CM | POA: Diagnosis not present

## 2021-11-28 DIAGNOSIS — K760 Fatty (change of) liver, not elsewhere classified: Secondary | ICD-10-CM | POA: Diagnosis present

## 2021-11-28 DIAGNOSIS — D61810 Antineoplastic chemotherapy induced pancytopenia: Secondary | ICD-10-CM | POA: Diagnosis present

## 2021-11-28 DIAGNOSIS — D649 Anemia, unspecified: Secondary | ICD-10-CM

## 2021-11-28 DIAGNOSIS — I69354 Hemiplegia and hemiparesis following cerebral infarction affecting left non-dominant side: Secondary | ICD-10-CM

## 2021-11-28 DIAGNOSIS — Z7982 Long term (current) use of aspirin: Secondary | ICD-10-CM

## 2021-11-28 DIAGNOSIS — B372 Candidiasis of skin and nail: Secondary | ICD-10-CM | POA: Diagnosis present

## 2021-11-28 DIAGNOSIS — E876 Hypokalemia: Secondary | ICD-10-CM | POA: Diagnosis present

## 2021-11-28 DIAGNOSIS — L03313 Cellulitis of chest wall: Secondary | ICD-10-CM

## 2021-11-28 DIAGNOSIS — E785 Hyperlipidemia, unspecified: Secondary | ICD-10-CM | POA: Diagnosis present

## 2021-11-28 DIAGNOSIS — E669 Obesity, unspecified: Secondary | ICD-10-CM | POA: Diagnosis present

## 2021-11-28 DIAGNOSIS — C50912 Malignant neoplasm of unspecified site of left female breast: Secondary | ICD-10-CM | POA: Diagnosis present

## 2021-11-28 DIAGNOSIS — D696 Thrombocytopenia, unspecified: Secondary | ICD-10-CM | POA: Diagnosis not present

## 2021-11-28 DIAGNOSIS — L0390 Cellulitis, unspecified: Secondary | ICD-10-CM | POA: Diagnosis present

## 2021-11-28 DIAGNOSIS — Z6832 Body mass index (BMI) 32.0-32.9, adult: Secondary | ICD-10-CM

## 2021-11-28 DIAGNOSIS — K529 Noninfective gastroenteritis and colitis, unspecified: Secondary | ICD-10-CM

## 2021-11-28 DIAGNOSIS — I2510 Atherosclerotic heart disease of native coronary artery without angina pectoris: Secondary | ICD-10-CM | POA: Diagnosis not present

## 2021-11-28 DIAGNOSIS — R7881 Bacteremia: Secondary | ICD-10-CM | POA: Diagnosis present

## 2021-11-28 DIAGNOSIS — Z885 Allergy status to narcotic agent status: Secondary | ICD-10-CM

## 2021-11-28 DIAGNOSIS — E039 Hypothyroidism, unspecified: Secondary | ICD-10-CM | POA: Diagnosis present

## 2021-11-28 DIAGNOSIS — Z79811 Long term (current) use of aromatase inhibitors: Secondary | ICD-10-CM

## 2021-11-28 DIAGNOSIS — E119 Type 2 diabetes mellitus without complications: Secondary | ICD-10-CM | POA: Diagnosis present

## 2021-11-28 DIAGNOSIS — B955 Unspecified streptococcus as the cause of diseases classified elsewhere: Secondary | ICD-10-CM | POA: Diagnosis present

## 2021-11-28 DIAGNOSIS — C7951 Secondary malignant neoplasm of bone: Secondary | ICD-10-CM | POA: Diagnosis present

## 2021-11-28 DIAGNOSIS — Z79899 Other long term (current) drug therapy: Secondary | ICD-10-CM | POA: Diagnosis not present

## 2021-11-28 DIAGNOSIS — L03114 Cellulitis of left upper limb: Principal | ICD-10-CM | POA: Diagnosis present

## 2021-11-28 DIAGNOSIS — Z8249 Family history of ischemic heart disease and other diseases of the circulatory system: Secondary | ICD-10-CM | POA: Diagnosis not present

## 2021-11-28 DIAGNOSIS — Z96651 Presence of right artificial knee joint: Secondary | ICD-10-CM | POA: Diagnosis present

## 2021-11-28 DIAGNOSIS — D61818 Other pancytopenia: Secondary | ICD-10-CM | POA: Insufficient documentation

## 2021-11-28 DIAGNOSIS — R112 Nausea with vomiting, unspecified: Principal | ICD-10-CM

## 2021-11-28 DIAGNOSIS — Z808 Family history of malignant neoplasm of other organs or systems: Secondary | ICD-10-CM

## 2021-11-28 DIAGNOSIS — Z955 Presence of coronary angioplasty implant and graft: Secondary | ICD-10-CM

## 2021-11-28 DIAGNOSIS — Z7901 Long term (current) use of anticoagulants: Secondary | ICD-10-CM

## 2021-11-28 DIAGNOSIS — R531 Weakness: Secondary | ICD-10-CM | POA: Diagnosis present

## 2021-11-28 DIAGNOSIS — Z9049 Acquired absence of other specified parts of digestive tract: Secondary | ICD-10-CM

## 2021-11-28 DIAGNOSIS — Z9013 Acquired absence of bilateral breasts and nipples: Secondary | ICD-10-CM

## 2021-11-28 DIAGNOSIS — C50911 Malignant neoplasm of unspecified site of right female breast: Secondary | ICD-10-CM | POA: Diagnosis present

## 2021-11-28 DIAGNOSIS — Z90710 Acquired absence of both cervix and uterus: Secondary | ICD-10-CM

## 2021-11-28 LAB — COMPREHENSIVE METABOLIC PANEL
ALT: 16 U/L (ref 0–44)
AST: 25 U/L (ref 15–41)
Albumin: 3.7 g/dL (ref 3.5–5.0)
Alkaline Phosphatase: 160 U/L — ABNORMAL HIGH (ref 38–126)
Anion gap: 9 (ref 5–15)
BUN: 15 mg/dL (ref 8–23)
CO2: 19 mmol/L — ABNORMAL LOW (ref 22–32)
Calcium: 8.5 mg/dL — ABNORMAL LOW (ref 8.9–10.3)
Chloride: 108 mmol/L (ref 98–111)
Creatinine, Ser: 1.19 mg/dL — ABNORMAL HIGH (ref 0.44–1.00)
GFR, Estimated: 45 mL/min — ABNORMAL LOW (ref >60)
Glucose, Bld: 108 mg/dL — ABNORMAL HIGH (ref 70–99)
Potassium: 3 mmol/L — ABNORMAL LOW (ref 3.5–5.1)
Sodium: 136 mmol/L (ref 135–145)
Total Bilirubin: 0.8 mg/dL (ref 0.3–1.2)
Total Protein: 7 g/dL (ref 6.5–8.1)

## 2021-11-28 LAB — URINALYSIS, ROUTINE W REFLEX MICROSCOPIC
Bacteria, UA: NONE SEEN
Bilirubin Urine: NEGATIVE
Glucose, UA: NEGATIVE mg/dL
Ketones, ur: NEGATIVE mg/dL
Leukocytes,Ua: NEGATIVE
Nitrite: NEGATIVE
Protein, ur: 30 mg/dL — AB
Specific Gravity, Urine: 1.008 (ref 1.005–1.030)
pH: 6 (ref 5.0–8.0)

## 2021-11-28 LAB — CBC WITH DIFFERENTIAL/PLATELET
Abs Immature Granulocytes: 0 10*3/uL (ref 0.00–0.07)
Band Neutrophils: 5 %
Basophils Absolute: 0 10*3/uL (ref 0.0–0.1)
Basophils Relative: 0 %
Eosinophils Absolute: 0 10*3/uL (ref 0.0–0.5)
Eosinophils Relative: 0 %
HCT: 22.5 % — ABNORMAL LOW (ref 36.0–46.0)
Hemoglobin: 7 g/dL — ABNORMAL LOW (ref 12.0–15.0)
Lymphocytes Relative: 15 %
Lymphs Abs: 0.4 10*3/uL — ABNORMAL LOW (ref 0.7–4.0)
MCH: 28.9 pg (ref 26.0–34.0)
MCHC: 31.1 g/dL (ref 30.0–36.0)
MCV: 93 fL (ref 80.0–100.0)
Monocytes Absolute: 0.1 10*3/uL (ref 0.1–1.0)
Monocytes Relative: 3 %
Neutro Abs: 2 10*3/uL (ref 1.7–7.7)
Neutrophils Relative %: 77 %
Platelets: 96 10*3/uL — ABNORMAL LOW (ref 150–400)
RBC: 2.42 MIL/uL — ABNORMAL LOW (ref 3.87–5.11)
RDW: 32.8 % — ABNORMAL HIGH (ref 11.5–15.5)
WBC: 2.4 10*3/uL — ABNORMAL LOW (ref 4.0–10.5)
nRBC: 0 % (ref 0.0–0.2)

## 2021-11-28 LAB — LACTIC ACID, PLASMA: Lactic Acid, Venous: 1.3 mmol/L (ref 0.5–1.9)

## 2021-11-28 LAB — MAGNESIUM: Magnesium: 2 mg/dL (ref 1.7–2.4)

## 2021-11-28 MED ORDER — LACTATED RINGERS IV BOLUS (SEPSIS)
500.0000 mL | Freq: Once | INTRAVENOUS | Status: AC
  Administered 2021-11-28: 500 mL via INTRAVENOUS

## 2021-11-28 MED ORDER — POTASSIUM CHLORIDE 10 MEQ/100ML IV SOLN
10.0000 meq | INTRAVENOUS | Status: AC
  Administered 2021-11-28 (×2): 10 meq via INTRAVENOUS
  Filled 2021-11-28 (×2): qty 100

## 2021-11-28 MED ORDER — VANCOMYCIN HCL 750 MG/150ML IV SOLN
750.0000 mg | INTRAVENOUS | Status: DC
  Administered 2021-11-29: 750 mg via INTRAVENOUS
  Filled 2021-11-28: qty 150

## 2021-11-28 MED ORDER — SODIUM CHLORIDE 0.9 % IV SOLN
2.0000 g | Freq: Once | INTRAVENOUS | Status: AC
  Administered 2021-11-28: 2 g via INTRAVENOUS
  Filled 2021-11-28: qty 20

## 2021-11-28 MED ORDER — POTASSIUM CHLORIDE CRYS ER 20 MEQ PO TBCR
40.0000 meq | EXTENDED_RELEASE_TABLET | Freq: Once | ORAL | Status: AC
  Administered 2021-11-28: 40 meq via ORAL
  Filled 2021-11-28: qty 2

## 2021-11-28 MED ORDER — PROCHLORPERAZINE MALEATE 10 MG PO TABS: 10.0000 mg | ORAL_TABLET | Freq: Four times a day (QID) | ORAL | 4 refills | Status: DC | PRN

## 2021-11-28 MED ORDER — VANCOMYCIN HCL 2000 MG/400ML IV SOLN
2000.0000 mg | Freq: Once | INTRAVENOUS | Status: AC
  Administered 2021-11-28: 2000 mg via INTRAVENOUS
  Filled 2021-11-28: qty 400

## 2021-11-28 MED ORDER — LACTATED RINGERS IV SOLN: INTRAVENOUS | Status: AC

## 2021-11-28 MED ORDER — POTASSIUM CHLORIDE 20 MEQ PO PACK: 40.0000 meq | PACK | Freq: Once | ORAL | Status: DC

## 2021-11-28 MED ORDER — VANCOMYCIN HCL IN DEXTROSE 1-5 GM/200ML-% IV SOLN: 1000.0000 mg | Freq: Once | INTRAVENOUS | Status: DC

## 2021-11-28 NOTE — ED Triage Notes (Signed)
Pt via POV c/o nausea and vomiting with chills since last night. 4 episodes of vomiting in the past 24 hours with low grade fever reported. Pt also has significant swelling and warmth to left arm and hand since this afternoon. Pt is currently mildly nauseated but denies pain. No recent sick contacts. Hx bilateral breast cancer stage IV with mets to bones. Pt is receiving Prolia injections every month and oral chemo. Low grade fever noted in triage

## 2021-11-28 NOTE — ED Provider Notes (Signed)
[facility] EMERGENCY DEPARTMENT Provider Note   CSN: [id number] Arrival date & time: 11/28/21  1636     History  Chief Complaint  Patient presents with   Emesis    Robin [name] is a 81 y.o. female.  Pt with c/o nausea, vomiting, and chills acute onset last night. Emesis not bloody or bilious. Denies abd pain. No diarrhea. Today has noted erythema and warmth to left arm. No hx same. No trauma to arm. Remote hx mastectomy. No hx dvt or pe. No severe pain to arm. No dysuria or gu c/o. No known fevers.  Felt generally weak, no focal or unilateral weakness. No change in speech or vision. No headaches.   The history is provided by the patient, a relative and medical records.  Emesis Associated symptoms: chills   Associated symptoms: no abdominal pain, no cough, no fever, no headaches and no sore throat        Home Medications Prior to Admission medications   Medication Sig Start Date End Date Taking? Authorizing Provider  acetaminophen (TYLENOL) 325 MG tablet Take 2 tablets (650 mg total) by mouth every 6 (six) hours as needed for mild pain, fever or headache (or Fever >/= 101). 04/06/20   Roxan Hockey, MD  ALPRAZolam Duanne Moron) 0.25 MG tablet Take 1 tablet (0.25 mg total) by mouth 2 (two) times daily as needed for anxiety. 07/29/21   Derek Jack, MD  amLODipine (NORVASC) 10 MG tablet Take 1 tablet (10 mg total) by mouth daily. 04/23/21   [name], MD  anastrozole (ARIMIDEX) 1 MG tablet TAKE 1 TABLET BY MOUTH EVERY DAY 08/20/21   Derek Jack, MD  aspirin 81 MG tablet Take 1 tablet (81 mg total) by mouth daily with breakfast. 04/06/20   Emokpae, Courage, MD  carvedilol (COREG) 12.5 MG tablet TAKE 1 TABLET (12.'5MG'$  TOTAL) BY MOUTH TWICE A DAY WITH MEALS 11/18/21   [name], MD  cetirizine (ZYRTEC) 10 MG tablet Take 1 tablet (10 mg total) by mouth daily. 05/07/21   [name], MD  Cholecalciferol (VITAMIN D3) 125 MCG (5000 UT) CAPS TAKE 1 CAPSULE BY MOUTH  ONCE DAILY Patient taking differently: Take 1 capsule by mouth daily. 01/14/18   Cassandria Anger, MD  dicyclomine (BENTYL) 10 MG capsule Take 1 capsule (10 mg total) by mouth 3 (three) times daily as needed for spasms. 11/27/20   [name], MD  ELIQUIS 5 MG TABS tablet TAKE 1 TABLET BY MOUTH TWICE A DAY 11/18/21   Derek Jack, MD  ferrous sulfate 325 (65 FE) MG tablet Take 1 tablet (325 mg total) by mouth daily with breakfast. 05/06/20   Cristal Deer, MD  fluticasone (FLONASE) 50 MCG/ACT nasal spray Place 2 sprays into both nostrils daily. 05/07/21   [name], MD  furosemide (LASIX) 20 MG tablet TAKE 1 TABLET BY MOUTH EVERY DAY AS NEEDED AS DIRECTED 12/17/20   [name], MD  gabapentin (NEURONTIN) 100 MG capsule TAKE 1 CAPSULE BY MOUTH AT BEDTIME. 11/04/21   [name], MD  isosorbide mononitrate (IMDUR) 30 MG 24 hr tablet Take 1 tablet (30 mg total) by mouth daily. 06/14/21   [name], MD  KLOR-CON M10 10 MEQ tablet TAKE 1 TABLET BY MOUTH EVERY DAY 07/11/21   [name], MD  levothyroxine (SYNTHROID) 125 MCG tablet TAKE 1 TABLET BY MOUTH DAILY BEFORE BREAKFAST. 08/20/21   Derek Jack, MD  Misc. Devices MISC Please provide with left upper extremity  lymphedema sleeve 11/25/21   Derek Jack, MD  nitroGLYCERIN (NITROSTAT) 0.4 MG SL tablet Place 1 tablet (0.4 mg total) under the tongue every 5 (five) minutes x 3 doses as needed. 01/11/14   Satira Sark, MD  Omega-3 Fatty Acids 300 MG CAPS Take 1 capsule (300 mg total) by mouth 2 (two) times daily. 04/24/11   Serpe, Burna Forts, PA-C  palbociclib (IBRANCE) 75 MG tablet Take 1 tablet (75 mg total) by mouth daily. Take for 21 days on, 7 days off, repeat every 28 days. 10/28/21   Derek Jack, MD  pantoprazole (PROTONIX) 40 MG tablet Take 1 tablet (40 mg total) by mouth daily. 06/24/21   [name], MD  prochlorperazine (COMPAZINE) 10 MG tablet Take 1 tablet (10 mg total) by mouth  every 6 (six) hours as needed for nausea or vomiting. 11/28/21   [name], MD  rosuvastatin (CRESTOR) 40 MG tablet TAKE 1 TABLET BY MOUTH EVERY DAY 11/27/21   [name], MD  traMADol (ULTRAM) 50 MG tablet Take 1 tablet (50 mg total) by mouth every 8 (eight) hours as needed. 07/29/21   Derek Jack, MD      Allergies    Codeine    Review of Systems   Review of Systems  Constitutional:  Positive for chills. Negative for fever.  HENT:  Negative for sore throat.   Eyes:  Negative for pain, redness and visual disturbance.  Respiratory:  Negative for cough and shortness of breath.   Cardiovascular:  Negative for chest pain and leg swelling.  Gastrointestinal:  Positive for nausea and vomiting. Negative for abdominal pain.  Genitourinary:  Negative for dysuria and flank pain.  Musculoskeletal:  Negative for back pain and neck pain.  Skin:  Negative for rash.  Neurological:  Positive for weakness. Negative for headaches.  Hematological:  Does not bruise/bleed easily.  Psychiatric/Behavioral:  Negative for confusion.     Physical Exam Updated Vital Signs BP (!) 175/60 (BP Location: Right Arm)   Pulse 77   Temp 99.2 F (37.3 C) (Oral)   Resp 16   Ht 1.676 m ('5\' 6"'$ )   Wt 91.2 kg   SpO2 97%   BMI 32.44 kg/m  Physical Exam Vitals and nursing note reviewed.  Constitutional:      Appearance: Normal appearance. She is well-developed.  HENT:     Head: Atraumatic.     Nose: Nose normal.     Mouth/Throat:     Mouth: Mucous membranes are moist.  Eyes:     General: No scleral icterus.    Conjunctiva/sclera: Conjunctivae normal.     Pupils: Pupils are equal, round, and reactive to light.  Neck:     Vascular: No carotid bruit.     Trachea: No tracheal deviation.  Cardiovascular:     Rate and Rhythm: Normal rate and regular rhythm.     Pulses: Normal pulses.     Heart sounds: Normal heart sounds. No murmur heard.    No friction rub. No gallop.  Pulmonary:      Effort: Pulmonary effort is normal. No respiratory distress.     Breath sounds: Normal breath sounds.  Abdominal:     General: Bowel sounds are normal. There is no distension.     Palpations: Abdomen is soft.     Tenderness: There is no abdominal tenderness. There is no guarding.  Genitourinary:    Comments: No cva tenderness.  Musculoskeletal:     Cervical back: Normal range of motion  and neck supple. No rigidity. No muscular tenderness.     Comments: Bil symmetric swelling to legs - pt indicates baseline for her.  Left arm with erythema and increased warmth extending to shoulder and left lateral chest c/w cellulitis. No crepitus. No abscess/flucutnace. Compartments of arm are soft, not tense. No pain w passive rom at shoulder or elbow. Radial pulse palp.   Skin:    General: Skin is warm and dry.     Findings: No rash.  Neurological:     Mental Status: She is alert.     Comments: Alert, speech normal.   Psychiatric:        Mood and Affect: Mood normal.     ED Results / Procedures / Treatments   Labs (all labs ordered are listed, but only abnormal results are displayed) Results for orders placed or performed during the hospital encounter of 11/28/21  Blood Culture (routine x 2)   Specimen: BLOOD  Result Value Ref Range   Specimen Description BLOOD BLOOD RIGHT ARM    Special Requests      BOTTLES DRAWN AEROBIC AND ANAEROBIC Blood Culture adequate volume Performed at [facility], [address], [place], [place] [address]    Culture PENDING    Report Status PENDING   Lactic acid, plasma  Result Value Ref Range   Lactic Acid, Venous 1.3 0.5 - 1.9 mmol/L  Comprehensive metabolic panel  Result Value Ref Range   Sodium 136 135 - 145 mmol/L   Potassium 3.0 (L) 3.5 - 5.1 mmol/L   Chloride 108 98 - 111 mmol/L   CO2 19 (L) 22 - 32 mmol/L   Glucose, Bld 108 (H) 70 - 99 mg/dL   BUN 15 8 - 23 mg/dL   Creatinine, Ser 1.19 (H) 0.44 - 1.00 mg/dL   Calcium 8.5 (L) 8.9 - 10.3 mg/dL    Total Protein 7.0 6.5 - 8.1 g/dL   Albumin 3.7 3.5 - 5.0 g/dL   AST 25 15 - 41 U/L   ALT 16 0 - 44 U/L   Alkaline Phosphatase 160 (H) 38 - 126 U/L   Total Bilirubin 0.8 0.3 - 1.2 mg/dL   GFR, Estimated 45 (L) >60 mL/min   Anion gap 9 5 - 15  CBC with Differential  Result Value Ref Range   WBC 2.4 (L) 4.0 - 10.5 K/uL   RBC 2.42 (L) 3.87 - 5.11 MIL/uL   Hemoglobin 7.0 (L) 12.0 - 15.0 g/dL   HCT 22.5 (L) 36.0 - 46.0 %   MCV 93.0 80.0 - 100.0 fL   MCH 28.9 26.0 - 34.0 pg   MCHC 31.1 30.0 - 36.0 g/dL   RDW 32.8 (H) 11.5 - 15.5 %   Platelets 96 (L) 150 - 400 K/uL   nRBC 0.0 0.0 - 0.2 %   Neutrophils Relative % 77 %   Neutro Abs 2.0 1.7 - 7.7 K/uL   Band Neutrophils 5 %   Lymphocytes Relative 15 %   Lymphs Abs 0.4 (L) 0.7 - 4.0 K/uL   Monocytes Relative 3 %   Monocytes Absolute 0.1 0.1 - 1.0 K/uL   Eosinophils Relative 0 %   Eosinophils Absolute 0.0 0.0 - 0.5 K/uL   Basophils Relative 0 %   Basophils Absolute 0.0 0.0 - 0.1 K/uL   WBC Morphology MORPHOLOGY UNREMARKABLE    RBC Morphology See Note    Smear Review PLATELET COUNT CONFIRMED BY SMEAR    Abs Immature Granulocytes 0.00 0.00 - 0.07 K/uL  Magnesium  Result Value Ref Range   Magnesium 2.0 1.7 - 2.4 mg/dL   US Venous Img Upper Uni Left  Result Date: 11/14/2021 CLINICAL DATA:  Left upper extremity edema. EXAM: LEFT UPPER EXTREMITY VENOUS DOPPLER ULTRASOUND TECHNIQUE: Gray-scale sonography with graded compression, as well as color Doppler and duplex ultrasound were performed to evaluate the upper extremity deep venous system from the level of the subclavian vein and including the jugular, axillary, basilic, radial, ulnar and upper cephalic vein. Spectral Doppler was utilized to evaluate flow at rest and with distal augmentation maneuvers. COMPARISON:  None Available. FINDINGS: Contralateral Subclavian Vein: Respiratory phasicity is normal and symmetric with the symptomatic side. No evidence of thrombus. Normal compressibility.  Internal Jugular Vein: No evidence of thrombus. Normal compressibility, respiratory phasicity and response to augmentation. Subclavian Vein: No evidence of thrombus. Normal compressibility, respiratory phasicity and response to augmentation. Axillary Vein: No evidence of thrombus. Normal compressibility, respiratory phasicity and response to augmentation. Cephalic Vein: No evidence of thrombus. Normal compressibility, respiratory phasicity and response to augmentation. Basilic Vein: No evidence of thrombus. Normal compressibility, respiratory phasicity and response to augmentation. Brachial Veins: No evidence of thrombus. Normal compressibility, respiratory phasicity and response to augmentation. Radial Veins: No evidence of thrombus. Normal compressibility, respiratory phasicity and response to augmentation. Ulnar Veins: No evidence of thrombus. Normal compressibility, respiratory phasicity and response to augmentation. Venous Reflux:  None visualized. Other Findings: No evidence of superficial thrombophlebitis or abnormal fluid collection. IMPRESSION: No evidence of DVT within the left upper extremity. Electronically Signed   By: Aletta Edouard M.D.   On: 11/14/2021 17:10    EKG EKG Interpretation  Date/Time:  Thursday November 28 2021 18:33:06 EST Ventricular Rate:  88 PR Interval:  193 QRS Duration: 95 QT Interval:  378 QTC Calculation: 458 R Axis:   -10 Text Interpretation: Sinus rhythm Low voltage, precordial leads Nonspecific T wave abnormality Baseline wander Confirmed by Lajean Saver [id number] on 11/28/2021 6:41:53 PM  Radiology No results found.  Procedures Procedures    Medications Ordered in ED Medications  lactated ringers infusion (has no administration in time range)  lactated ringers bolus 500 mL (has no administration in time range)  vancomycin (VANCOCIN) IVPB 1000 mg/200 mL premix (has no administration in time range)  cefTRIAXone (ROCEPHIN) 2 g in sodium chloride 0.9 % 100  mL IVPB (has no administration in time range)    ED Course/ Medical Decision Making/ A&P                           Medical Decision Making Problems Addressed: Cellulitis of chest wall: acute illness or injury with systemic symptoms that poses a threat to life or bodily functions Cellulitis of left arm: acute illness or injury with systemic symptoms that poses a threat to life or bodily functions Chronic anemia: chronic illness or injury that poses a threat to life or bodily functions Generalized weakness: acute illness or injury with systemic symptoms Hypokalemia: acute illness or injury Nausea and vomiting in adult: acute illness or injury with systemic symptoms Thrombocytopenia ([place]): chronic illness or injury  Amount and/or Complexity of Data Reviewed Independent Historian:     Details: Family, hx External Data Reviewed: notes. Labs: ordered. Decision-making details documented in ED Course. Radiology: independent interpretation performed. Decision-making details documented in ED Course. ECG/medicine tests: ordered and independent interpretation performed. Decision-making details documented in ED Course. Discussion of management or test interpretation with external provider(s): hospitalists  Risk Prescription drug management. Decision  regarding hospitalization.   Iv ns. Continuous pulse ox and cardiac monitoring. Labs ordered/sent.   Reviewed nursing notes and prior charts for additional history. External reports reviewed. Additional history from: family.  Cardiac monitor: sinus rhythm, rate 78.  Labs reviewed/interpreted by me - wbc chr low. K low, kcl iv. Lactate normal.   Recent LUE u/s reviewed/interpreted by me - no dvt.   Cultures sent. Iv antibiotics given. LR bolus.  Hospitalists consulted for admission.           Final Clinical Impression(s) / ED Diagnoses Final diagnoses:  None    Rx / DC Orders ED Discharge Orders     None          Lajean Saver, MD 11/28/21 2117

## 2021-11-28 NOTE — Progress Notes (Signed)
Pharmacy Antibiotic Note  Robin [name] is a 81 y.o. female admitted on 11/28/2021 with cellulitis.  Pharmacy has been consulted for vancomycin dosing.  Plan: Vancomycin 2000 mg IV x 1 bolus dose, followed by 750 mg IV every 24 hours (estAUC 499, using SCr 1.16 and Vd 0.5) Monitor clinical progress, cultures/sensitivities, renal function, abx plan Vancomycin levels as indicated   Height: '5\' 6"'$  (167.6 cm) Weight: 91.2 kg (201 lb) IBW/kg (Calculated) : 59.3  Temp (24hrs), Avg:99.2 F (37.3 C), Min:99.2 F (37.3 C), Max:99.2 F (37.3 C)  Recent Labs  Lab 11/25/21 0947  WBC 1.8*  CREATININE 1.16*    Estimated Creatinine Clearance: 41.8 mL/min (A) (by C-G formula based on SCr of 1.16 mg/dL (H)).    Allergies  Allergen Reactions   Codeine Nausea And Vomiting    Antimicrobials this admission: 11/9 CTX  x 1 11/9 vancomcyin >>   Dose adjustments this admission:  Microbiology results: 11/9 BCx: sent    Thank you for allowing [place] to participate in this patients care. Jens Som, PharmD 11/28/2021 6:34 PM  **Pharmacist phone directory can be found on [email or web address] listed under [place]**

## 2021-11-28 NOTE — Telephone Encounter (Signed)
Daughter Robin [name] Patient called to make [place] aware that patient is being treated for cellulitis of arm.  She is now running a fever and experiencing nausea and vomiting.  Advised that she seek medical attention in the ER.  Verbalized understanding.

## 2021-11-28 NOTE — Progress Notes (Signed)
Virtual Visit via Telephone Note   This visit type was conducted via telephone. This format is felt to be most appropriate for this patient at this time.  The patient did not have access to video technology/had technical difficulties with video requiring transitioning to audio format only (telephone).  All issues noted in this document were discussed and addressed.  No physical exam could be performed with this format.  Evaluation Performed:  Follow-up visit  Date:  11/28/2021   ID:  Robin [name], DOB 01-05-1941, MRN [id number]  Patient Location: Home Provider Location: Office/Clinic  Participants: Patient and her daughter - Robin [name] Location of Patient: Home Location of Provider: Telehealth Consent was obtain for visit to be over via telehealth. I verified that I am speaking with the correct person using two identifiers.  PCP:  Robin Spar, MD   Chief Complaint: Nausea and abdominal cramps  History of Present Illness:    Robin [name] is a 81 y.o. female who has televisit for complaint of nausea and abdominal cramps that started last night.  She had chicken drummet yesterday and started having nausea with NBNB vomiting last night.  Her daughter has been trying to give her fluids.  She denies any fever, but has mild chills.  Denies any diarrhea, melena or hematochezia.  The patient does not have symptoms concerning for COVID-19 infection (fever, chills, cough, or new shortness of breath).   Past Medical, Surgical, Social History, Allergies, and Medications have been Reviewed.  Past Medical History:  Diagnosis Date   Breast cancer ([place])    Tamoxifen started 2007   CAD (coronary artery disease)    DES second diagonal 2/12   Cardiomyopathy    Probable Takotsubo, LVEF 30-35% 2/12   Carotid artery disease ([facility])    Essential hypertension    Hepatic steatosis    Hyperlipidemia    Hypothyroidism    NSTEMI (non-ST elevated myocardial infarction) ([place])    2/12    Pancreas divisum    Stroke ([place])    (2000) residual left-sided weakness   Type 2 diabetes mellitus ([place])    reports not currently diabetic   Past Surgical History:  Procedure Laterality Date   ABDOMINAL HYSTERECTOMY     CAROTID ENDARTERECTOMY     Left   CHOLECYSTECTOMY     COLONOSCOPY WITH PROPOFOL N/A 05/03/2020   Procedure: COLONOSCOPY WITH PROPOFOL;  Surgeon: Daneil Dolin, MD;  [facility];  Service: Endoscopy;  Laterality: N/A;   ESOPHAGOGASTRODUODENOSCOPY (EGD) WITH PROPOFOL N/A 05/02/2020   Procedure: ESOPHAGOGASTRODUODENOSCOPY (EGD) WITH PROPOFOL;  Surgeon: Rogene Houston, MD;  Location: [facility];  Service: Endoscopy;  Laterality: N/A;   GIVENS CAPSULE STUDY N/A 05/04/2020   Procedure: GIVENS CAPSULE STUDY;  Surgeon: Harvel Quale, MD;  Location: [facility];  Service: Gastroenterology;  Laterality: N/A;   JOINT REPLACEMENT     Right knee replacement- [facility]. [facility]   MASTECTOMY     Bilateral   POLYPECTOMY  05/03/2020   Procedure: POLYPECTOMY;  Surgeon: Daneil Dolin, MD;  Location: [facility];  Service: Endoscopy;;     Current Meds  Medication Sig   acetaminophen (TYLENOL) 325 MG tablet Take 2 tablets (650 mg total) by mouth every 6 (six) hours as needed for mild pain, fever or headache (or Fever >/= 101).   ALPRAZolam (XANAX) 0.25 MG tablet Take 1 tablet (0.25 mg total) by mouth 2 (two) times daily as needed for anxiety.  amLODipine (NORVASC) 10 MG tablet Take 1 tablet (10 mg total) by mouth daily.   anastrozole (ARIMIDEX) 1 MG tablet TAKE 1 TABLET BY MOUTH EVERY DAY   aspirin 81 MG tablet Take 1 tablet (81 mg total) by mouth daily with breakfast.   carvedilol (COREG) 12.5 MG tablet TAKE 1 TABLET (12.'5MG'$  TOTAL) BY MOUTH TWICE A DAY WITH MEALS   cetirizine (ZYRTEC) 10 MG tablet Take 1 tablet (10 mg total) by mouth daily.   Cholecalciferol (VITAMIN D3) 125 MCG (5000 UT) CAPS TAKE 1 CAPSULE BY MOUTH ONCE DAILY (Patient taking  differently: Take 1 capsule by mouth daily.)   dicyclomine (BENTYL) 10 MG capsule Take 1 capsule (10 mg total) by mouth 3 (three) times daily as needed for spasms.   ELIQUIS 5 MG TABS tablet TAKE 1 TABLET BY MOUTH TWICE A DAY   ferrous sulfate 325 (65 FE) MG tablet Take 1 tablet (325 mg total) by mouth daily with breakfast.   fluticasone (FLONASE) 50 MCG/ACT nasal spray Place 2 sprays into both nostrils daily.   furosemide (LASIX) 20 MG tablet TAKE 1 TABLET BY MOUTH EVERY DAY AS NEEDED AS DIRECTED   gabapentin (NEURONTIN) 100 MG capsule TAKE 1 CAPSULE BY MOUTH AT BEDTIME.   isosorbide mononitrate (IMDUR) 30 MG 24 hr tablet Take 1 tablet (30 mg total) by mouth daily.   KLOR-CON M10 10 MEQ tablet TAKE 1 TABLET BY MOUTH EVERY DAY   levothyroxine (SYNTHROID) 125 MCG tablet TAKE 1 TABLET BY MOUTH DAILY BEFORE BREAKFAST.   Misc. Devices MISC Please provide with left upper extremity lymphedema sleeve   nitroGLYCERIN (NITROSTAT) 0.4 MG SL tablet Place 1 tablet (0.4 mg total) under the tongue every 5 (five) minutes x 3 doses as needed.   Omega-3 Fatty Acids 300 MG CAPS Take 1 capsule (300 mg total) by mouth 2 (two) times daily.   palbociclib (IBRANCE) 75 MG tablet Take 1 tablet (75 mg total) by mouth daily. Take for 21 days on, 7 days off, repeat every 28 days.   pantoprazole (PROTONIX) 40 MG tablet Take 1 tablet (40 mg total) by mouth daily.   prochlorperazine (COMPAZINE) 10 MG tablet Take 1 tablet (10 mg total) by mouth every 6 (six) hours as needed for nausea or vomiting.   rosuvastatin (CRESTOR) 40 MG tablet TAKE 1 TABLET BY MOUTH EVERY DAY   traMADol (ULTRAM) 50 MG tablet Take 1 tablet (50 mg total) by mouth every 8 (eight) hours as needed.     Allergies:   Codeine   ROS:   Please see the history of present illness.     All other systems reviewed and are negative.   Labs/Other Tests and Data Reviewed:    Recent Labs: 06/24/2021: TSH 0.694 11/25/2021: ALT 19; BUN 16; Creatinine, Ser 1.16;  Hemoglobin 7.4; Magnesium 2.1; Platelets 102; Potassium 3.7; Sodium 139   Recent Lipid Panel Lab Results  Component Value Date/Time   CHOL 145 03/01/2020 10:08 AM   TRIG 109 03/01/2020 10:08 AM   HDL 46 (L) 03/01/2020 10:08 AM   CHOLHDL 3.2 03/01/2020 10:08 AM   LDLCALC 80 03/01/2020 10:08 AM    Wt Readings from Last 3 Encounters:  11/25/21 194 lb 1.6 oz (88 kg)  11/06/21 200 lb (90.7 kg)  10/28/21 201 lb (91.2 kg)     ASSESSMENT & PLAN:    Acute gastroenteritis Advised to maintain adequate hydration Most of the gastroenteritis are self resolving - if persistent symptoms, will start antibiotic Compazine as needed for nausea  Time:   Today, I have spent 9 minutes reviewing the chart, including problem list, medications, and with the patient with telehealth technology discussing the above problems.   Medication Adjustments/Labs and Tests Ordered: Current medicines are reviewed at length with the patient today.  Concerns regarding medicines are outlined above.   Tests Ordered: No orders of the defined types were placed in this encounter.   Medication Changes: No orders of the defined types were placed in this encounter.    Note: This dictation was prepared with Dragon dictation along with smaller phrase technology. Similar sounding words can be transcribed inadequately or may not be corrected upon review. Any transcriptional errors that result from this process are unintentional.      Disposition:  Follow up  Signed, Robin Spar, MD  11/28/2021 10:06 AM     [place]

## 2021-11-29 DIAGNOSIS — D649 Anemia, unspecified: Secondary | ICD-10-CM

## 2021-11-29 DIAGNOSIS — B372 Candidiasis of skin and nail: Secondary | ICD-10-CM | POA: Diagnosis not present

## 2021-11-29 DIAGNOSIS — E039 Hypothyroidism, unspecified: Secondary | ICD-10-CM | POA: Diagnosis not present

## 2021-11-29 DIAGNOSIS — Z86718 Personal history of other venous thrombosis and embolism: Secondary | ICD-10-CM | POA: Diagnosis not present

## 2021-11-29 DIAGNOSIS — I2510 Atherosclerotic heart disease of native coronary artery without angina pectoris: Secondary | ICD-10-CM

## 2021-11-29 DIAGNOSIS — D61818 Other pancytopenia: Secondary | ICD-10-CM | POA: Insufficient documentation

## 2021-11-29 DIAGNOSIS — R112 Nausea with vomiting, unspecified: Secondary | ICD-10-CM

## 2021-11-29 DIAGNOSIS — L03313 Cellulitis of chest wall: Secondary | ICD-10-CM

## 2021-11-29 DIAGNOSIS — R531 Weakness: Secondary | ICD-10-CM

## 2021-11-29 DIAGNOSIS — K219 Gastro-esophageal reflux disease without esophagitis: Secondary | ICD-10-CM | POA: Diagnosis not present

## 2021-11-29 DIAGNOSIS — L03114 Cellulitis of left upper limb: Secondary | ICD-10-CM

## 2021-11-29 DIAGNOSIS — E876 Hypokalemia: Secondary | ICD-10-CM | POA: Diagnosis not present

## 2021-11-29 DIAGNOSIS — I10 Essential (primary) hypertension: Secondary | ICD-10-CM | POA: Diagnosis not present

## 2021-11-29 LAB — BLOOD CULTURE ID PANEL (REFLEXED) - BCID2

## 2021-11-29 LAB — COMPREHENSIVE METABOLIC PANEL
ALT: 16 U/L (ref 0–44)
AST: 23 U/L (ref 15–41)
Albumin: 3.2 g/dL — ABNORMAL LOW (ref 3.5–5.0)
Alkaline Phosphatase: 146 U/L — ABNORMAL HIGH (ref 38–126)
Anion gap: 7 (ref 5–15)
BUN: 12 mg/dL (ref 8–23)
CO2: 19 mmol/L — ABNORMAL LOW (ref 22–32)
Calcium: 8.4 mg/dL — ABNORMAL LOW (ref 8.9–10.3)
Chloride: 111 mmol/L (ref 98–111)
Creatinine, Ser: 0.96 mg/dL (ref 0.44–1.00)
GFR, Estimated: 59 mL/min — ABNORMAL LOW (ref >60)
Glucose, Bld: 99 mg/dL (ref 70–99)
Potassium: 3.3 mmol/L — ABNORMAL LOW (ref 3.5–5.1)
Sodium: 137 mmol/L (ref 135–145)
Total Bilirubin: 0.6 mg/dL (ref 0.3–1.2)
Total Protein: 6.3 g/dL — ABNORMAL LOW (ref 6.5–8.1)

## 2021-11-29 LAB — PREPARE RBC (CROSSMATCH)

## 2021-11-29 LAB — CBC WITH DIFFERENTIAL/PLATELET
Basophils Absolute: 0 10*3/uL (ref 0.0–0.1)
Basophils Relative: 0 %
Eosinophils Absolute: 0 10*3/uL (ref 0.0–0.5)
Eosinophils Relative: 1 %
HCT: 21.2 % — ABNORMAL LOW (ref 36.0–46.0)
Hemoglobin: 6.6 g/dL — CL (ref 12.0–15.0)
Lymphocytes Relative: 18 %
Lymphs Abs: 0.4 10*3/uL — ABNORMAL LOW (ref 0.7–4.0)
MCH: 29.1 pg (ref 26.0–34.0)
MCHC: 31.1 g/dL (ref 30.0–36.0)
MCV: 93.4 fL (ref 80.0–100.0)
Monocytes Absolute: 0.1 10*3/uL (ref 0.1–1.0)
Monocytes Relative: 5 %
Neutro Abs: 1.5 10*3/uL — ABNORMAL LOW (ref 1.7–7.7)
Neutrophils Relative %: 76 %
Platelets: 89 10*3/uL — ABNORMAL LOW (ref 150–400)
RBC: 2.27 MIL/uL — ABNORMAL LOW (ref 3.87–5.11)
RDW: 33.2 % — ABNORMAL HIGH (ref 11.5–15.5)
WBC: 1.9 10*3/uL — ABNORMAL LOW (ref 4.0–10.5)
nRBC: 0 /100 WBC

## 2021-11-29 LAB — MRSA NEXT GEN BY PCR, NASAL: MRSA by PCR Next Gen: NOT DETECTED

## 2021-11-29 LAB — TSH: TSH: 5.715 u[IU]/mL — ABNORMAL HIGH (ref 0.350–4.500)

## 2021-11-29 LAB — MAGNESIUM: Magnesium: 2 mg/dL (ref 1.7–2.4)

## 2021-11-29 MED ORDER — ROSUVASTATIN CALCIUM 20 MG PO TABS
40.0000 mg | ORAL_TABLET | Freq: Every day | ORAL | Status: DC
  Administered 2021-11-29 – 2021-12-03 (×5): 40 mg via ORAL
  Filled 2021-11-29 (×5): qty 2

## 2021-11-29 MED ORDER — ACETAMINOPHEN 325 MG PO TABS: 650.0000 mg | ORAL_TABLET | Freq: Four times a day (QID) | ORAL | Status: DC | PRN

## 2021-11-29 MED ORDER — PALBOCICLIB 75 MG PO TABS: 75.0000 mg | ORAL_TABLET | Freq: Every day | ORAL | Status: DC

## 2021-11-29 MED ORDER — CARVEDILOL 12.5 MG PO TABS
12.5000 mg | ORAL_TABLET | Freq: Two times a day (BID) | ORAL | Status: DC
  Administered 2021-11-29 – 2021-12-03 (×9): 12.5 mg via ORAL
  Filled 2021-11-29 (×9): qty 1

## 2021-11-29 MED ORDER — ISOSORBIDE MONONITRATE ER 60 MG PO TB24
30.0000 mg | ORAL_TABLET | Freq: Every day | ORAL | Status: DC
  Administered 2021-11-29 – 2021-12-03 (×5): 30 mg via ORAL
  Filled 2021-11-29 (×5): qty 1

## 2021-11-29 MED ORDER — SODIUM CHLORIDE 0.9% IV SOLUTION: Freq: Once | INTRAVENOUS | Status: AC

## 2021-11-29 MED ORDER — CHLORHEXIDINE GLUCONATE CLOTH 2 % EX PADS
6.0000 | MEDICATED_PAD | Freq: Every day | CUTANEOUS | Status: DC
  Administered 2021-11-29 – 2021-12-03 (×5): 6 via TOPICAL

## 2021-11-29 MED ORDER — ONDANSETRON HCL 4 MG PO TABS: 4.0000 mg | ORAL_TABLET | Freq: Four times a day (QID) | ORAL | Status: DC | PRN

## 2021-11-29 MED ORDER — AMLODIPINE BESYLATE 5 MG PO TABS
10.0000 mg | ORAL_TABLET | Freq: Every day | ORAL | Status: DC
  Administered 2021-11-29 – 2021-12-03 (×5): 10 mg via ORAL
  Filled 2021-11-29 (×5): qty 2

## 2021-11-29 MED ORDER — FUROSEMIDE 10 MG/ML IJ SOLN
20.0000 mg | Freq: Once | INTRAMUSCULAR | Status: AC
  Administered 2021-11-29: 20 mg via INTRAVENOUS
  Filled 2021-11-29: qty 2

## 2021-11-29 MED ORDER — ANASTROZOLE 1 MG PO TABS
1.0000 mg | ORAL_TABLET | Freq: Every day | ORAL | Status: DC
  Administered 2021-11-29 – 2021-12-03 (×5): 1 mg via ORAL
  Filled 2021-11-29 (×8): qty 1

## 2021-11-29 MED ORDER — ONDANSETRON HCL 4 MG/2ML IJ SOLN: 4.0000 mg | Freq: Four times a day (QID) | INTRAMUSCULAR | Status: DC | PRN

## 2021-11-29 MED ORDER — ACETAMINOPHEN 650 MG RE SUPP: 650.0000 mg | Freq: Four times a day (QID) | RECTAL | Status: DC | PRN

## 2021-11-29 MED ORDER — NYSTATIN 100000 UNIT/GM EX POWD
Freq: Three times a day (TID) | CUTANEOUS | Status: DC
  Filled 2021-11-29 (×2): qty 15

## 2021-11-29 MED ORDER — OXYCODONE HCL 5 MG PO TABS: 5.0000 mg | ORAL_TABLET | ORAL | Status: DC | PRN

## 2021-11-29 MED ORDER — ASPIRIN 81 MG PO TBEC
81.0000 mg | DELAYED_RELEASE_TABLET | Freq: Every day | ORAL | Status: DC
  Filled 2021-11-29: qty 1

## 2021-11-29 MED ORDER — GABAPENTIN 100 MG PO CAPS
100.0000 mg | ORAL_CAPSULE | Freq: Every day | ORAL | Status: DC
  Administered 2021-11-29 – 2021-12-02 (×4): 100 mg via ORAL
  Filled 2021-11-29 (×4): qty 1

## 2021-11-29 MED ORDER — LEVOTHYROXINE SODIUM 25 MCG PO TABS
125.0000 ug | ORAL_TABLET | Freq: Every day | ORAL | Status: DC
  Administered 2021-11-29 – 2021-12-03 (×5): 125 ug via ORAL
  Filled 2021-11-29 (×5): qty 1

## 2021-11-29 MED ORDER — CALCIUM GLUCONATE-NACL 1-0.675 GM/50ML-% IV SOLN
1.0000 g | Freq: Once | INTRAVENOUS | Status: AC
  Administered 2021-11-29: 1000 mg via INTRAVENOUS
  Filled 2021-11-29: qty 50

## 2021-11-29 MED ORDER — PANTOPRAZOLE SODIUM 40 MG PO TBEC
40.0000 mg | DELAYED_RELEASE_TABLET | Freq: Every day | ORAL | Status: DC
  Administered 2021-11-29 – 2021-12-03 (×5): 40 mg via ORAL
  Filled 2021-11-29 (×5): qty 1

## 2021-11-29 MED ORDER — APIXABAN 5 MG PO TABS
5.0000 mg | ORAL_TABLET | Freq: Two times a day (BID) | ORAL | Status: DC
  Administered 2021-11-29: 5 mg via ORAL
  Filled 2021-11-29: qty 1

## 2021-11-29 MED ORDER — MORPHINE SULFATE (PF) 2 MG/ML IV SOLN: 2.0000 mg | INTRAVENOUS | Status: DC | PRN

## 2021-11-29 NOTE — Assessment & Plan Note (Addendum)
-   Continue Eliquis 

## 2021-11-29 NOTE — Assessment & Plan Note (Addendum)
-   Continue Synthroid - TSH borderline elevated; could be in the setting of sick thyroid syndrome -Will recommend repeat thyroid panel as an outpatient after acute infection process resolved.

## 2021-11-29 NOTE — Assessment & Plan Note (Signed)
Continue nystatin powder. 

## 2021-11-29 NOTE — Care Management Obs Status (Signed)
[place] NOTIFICATION   Patient Details  Name: Robin [name] MRN: [id number] Date of Birth: September 20, 1940   Medicare Observation Status Notification Given:  Yes    Tommy Medal 11/29/2021, 4:32 PM

## 2021-11-29 NOTE — Assessment & Plan Note (Signed)
Continue Protonix °

## 2021-11-29 NOTE — Assessment & Plan Note (Signed)
-   Follows with Dr. Raliegh Ip - Most likely related to chemo - White blood cell count 2.4, hemoglobin 7.0, platelets 96 - Transfuse for hemoglobin less than 7 - Trend in the a.m.

## 2021-11-29 NOTE — Assessment & Plan Note (Signed)
-  Left arm edema and erythema appreciated at time of admission; improving currently. - Maintain limb elevated -Keep area clean and dry -Continue IV antibiotics -MRSA swab negative. -Left upper extremity ultrasound prior to admission reveal no presence of DVT. - Continue supportive care and follow clinical response.

## 2021-11-29 NOTE — Progress Notes (Signed)
Prior-To-Admission Oral Chemotherapy for Treatment of Oncologic Disease   Order noted from Dr. Clearence Ped to continue prior-to-admission oral chemotherapy regimen of palbociclib [facility]).  Procedure Per Pharmacy & Therapeutics Committee Policy: Orders for continuation of home oral chemotherapy for treatment of an oncologic disease will be held unless approved by an oncologist during current admission.    For patients receiving oncology care at [facility], inpatient pharmacist contacts patient's oncologist during regular office hours to review. If earlier review is medically necessary, attending physician consults [facility] on-call oncologist   For patients receiving oncology care outside of [facility], attending physician consults patient's oncologist to review. If this oncologist or their coverage cannot be reached, attending physician consults [facility] on-call oncologist   Oral chemotherapy continuation order is on hold pending oncologist review, [facility] oncologist Derek Jack will be notified by inpatient pharmacy during office hours.  Netta Cedars PharmD 11/29/2021, 1:33 AM

## 2021-11-29 NOTE — Assessment & Plan Note (Signed)
-   IV calcium gluconate given at time of admission -Continue close outpatient follow-up of calcium levels.

## 2021-11-29 NOTE — Evaluation (Signed)
Physical Therapy Evaluation Patient Details Name: Robin [name] MRN: [id number] DOB: [birth year] Today's Date: 11/29/2021  History of Present Illness  Robin [name] is a 81 y.o. female with medical history significant of breast cancer, coronary artery disease, cardiomyopathy, essential hypertension, hepatic steatosis, hyperlipidemia, hypothyroidism, stroke with residual left-sided weakness, type 2 diabetes mellitus, and more presents to the ED with a chief complaint of left arm swelling.  Daughter works in Corporate treasurer and is at bedside.  Patient request that we take most of the history from daughter.  Daughter reports that the swelling started about 2 weeks ago.  She was at the cancer center, so Dr. Raliegh Ip had a look at it and got an ultrasound.  Approximately 4 days ago, they were given the results of the ultrasound which was negative for DVT.  Dr. Raliegh Ip told him to get a compression sleeve, but she started to get worse.  Last night she had a fever and her arm became erythematous.  Erythema has been spreading in both directions from her elbow, all the way up to her chest, and down to her hand.  Her Tmax was 101.4.  She was given Tylenol which did control the fever.  Despite having had double mastectomy, patient has never had swelling in her arms like this before.  She reports that the arm is not painful.  The chest is not painful.  She does feel more fatigued and malaise than normal, but attributes it to the new chemo medication.  Last night she had 4 episodes of nonbloody emesis.  Few days ago she had 1 soft BM, that was softer than her BMs since she has been on chemo.  Patient reports that she does have asymmetric weakness but it is chronic since 2001 with her left upper extremity and lower extremity being affected.  Patient has no other complaints at this time.   Clinical Impression  Patient demonstrates labored movement for sitting up at bedside with limited use of LUE due to severe swelling, able to grip  RW with left hand but requires assistance to move left to walker, requires right foot blocked during sit to stands due to sliding forward, once standing has good return maintaining standing balance using RW.  Patient able to ambulate into hallway without loss of balance with labored cadence and limited mostly due to fatigue.  Patient tolerated sitting up in wheelchair to be taken to 300 floor by nursing staff after therapy.  Patient will benefit from continued skilled physical therapy in hospital and recommended venue below to increase strength, balance, endurance for safe ADLs and gait.       Recommendations for follow up therapy are one component of a multi-disciplinary discharge planning process, led by the attending physician.  Recommendations may be updated based on patient status, additional functional criteria and insurance authorization.  Follow Up Recommendations Home health PT      Assistance Recommended at Discharge Set up Supervision/Assistance  Patient can return home with the following  A little help with walking and/or transfers;A little help with bathing/dressing/bathroom;Help with stairs or ramp for entrance;Assistance with cooking/housework    Equipment Recommendations None recommended by PT  Recommendations for Other Services       Functional Status Assessment Patient has had a recent decline in their functional status and demonstrates the ability to make significant improvements in function in a reasonable and predictable amount of time.     Precautions / Restrictions Precautions Precautions: Fall Restrictions Weight Bearing Restrictions: No  Mobility  Bed Mobility Overal bed mobility: Needs Assistance Bed Mobility: Supine to Sit     Supine to sit: Min assist     General bed mobility comments: increased time, labored movement    Transfers Overall transfer level: Needs assistance Equipment used: Rolling walker (2 wheels) Transfers: Sit to/from Stand,  Bed to chair/wheelchair/BSC Sit to Stand: Min assist   Step pivot transfers: Min assist       General transfer comment: increased time, laobored movement, had to block right foot due to sliding forward during sit to stands    Ambulation/Gait Ambulation/Gait assistance: Min guard Gait Distance (Feet): 35 Feet Assistive device: Rolling walker (2 wheels) Gait Pattern/deviations: Decreased step length - right, Decreased step length - left, Decreased stride length Gait velocity: decreased     General Gait Details: slow slightly labored cadence without loss of balance, limited mostly due to fatigue, good return for gripping RW with left hand despite swelling  Stairs            Wheelchair Mobility    Modified Rankin (Stroke Patients Only)       Balance Overall balance assessment: Needs assistance Sitting-balance support: Feet supported, No upper extremity supported Sitting balance-Leahy Scale: Good Sitting balance - Comments: seated at EOB   Standing balance support: During functional activity, Bilateral upper extremity supported, Reliant on assistive device for balance Standing balance-Leahy Scale: Fair Standing balance comment: fair/good using RW                             Pertinent Vitals/Pain Pain Assessment Pain Assessment: No/denies pain    Home Living Family/patient expects to be discharged to:: Private residence Living Arrangements: Children (granddaughter) Available Help at Discharge: Family;Available 24 hours/day Type of Home: House Home Access: Ramped entrance       Home Layout: One level Home Equipment: Conservation officer, nature (2 wheels);Shower seat;BSC/3in1;Wheelchair - manual      Prior Function Prior Level of Function : Needs assist       Physical Assist : Mobility (physical);ADLs (physical) Mobility (physical): Bed mobility;Transfers;Gait;Stairs ADLs (physical): Bathing Mobility Comments: Freight forwarder, assisted for  taking showers ADLs Comments: assisted by family     Hand Dominance   Dominant Hand: Left    Extremity/Trunk Assessment   Upper Extremity Assessment Upper Extremity Assessment: Generalized weakness;LUE deficits/detail LUE Deficits / Details: grossly -3/5 mostly due to severe swelling LUE: Unable to fully assess due to immobilization LUE Sensation: WNL LUE Coordination: WNL    Lower Extremity Assessment Lower Extremity Assessment: Generalized weakness    Cervical / Trunk Assessment Cervical / Trunk Assessment: Kyphotic  Communication   Communication: No difficulties  Cognition Arousal/Alertness: Awake/alert Behavior During Therapy: WFL for tasks assessed/performed Overall Cognitive Status: Within Functional Limits for tasks assessed                                          General Comments      Exercises     Assessment/Plan    PT Assessment Patient needs continued PT services  PT Problem List Decreased strength;Decreased activity tolerance;Decreased balance;Decreased mobility       PT Treatment Interventions DME instruction;Gait training;Stair training;Functional mobility training;Therapeutic activities;Therapeutic exercise;Patient/family education;Balance training    PT Goals (Current goals can be found in the Care Plan section)  Acute Rehab PT Goals Patient Stated Goal: return  home with family to assist PT Goal Formulation: With patient/family Time For Goal Achievement: 12/04/21 Potential to Achieve Goals: Good    Frequency Min 3X/week     Co-evaluation               AM-PAC PT "6 Clicks" Mobility  Outcome Measure Help needed turning from your back to your side while in a flat bed without using bedrails?: A Little Help needed moving from lying on your back to sitting on the side of a flat bed without using bedrails?: A Little Help needed moving to and from a bed to a chair (including a wheelchair)?: A Little Help needed standing  up from a chair using your arms (e.g., wheelchair or bedside chair)?: A Little Help needed to walk in hospital room?: A Little Help needed climbing 3-5 steps with a railing? : A Lot 6 Click Score: 17    End of Session   Activity Tolerance: Patient tolerated treatment well;Patient limited by fatigue Patient left: in chair;with call bell/phone within reach Nurse Communication: Mobility status PT Visit Diagnosis: Unsteadiness on feet (R26.81);Other abnormalities of gait and mobility (R26.89);Muscle weakness (generalized) (M62.81)    Time: 7622-6333 PT Time Calculation (min) (ACUTE ONLY): 33 min   Charges:   PT Evaluation $PT Eval Moderate Complexity: 1 Mod PT Treatments $Therapeutic Activity: 23-37 mins        3:33 PM, 11/29/21 Lonell Grandchild, MPT Physical Therapist with [facility] [phone] office [phone] mobile phone

## 2021-11-29 NOTE — Assessment & Plan Note (Signed)
-   PT eval and treat - Check TSH - Most likely related to chemo treatments and now cellulitis as well - Continue treatment of cellulitis - Continue to monitor

## 2021-11-29 NOTE — TOC Progression Note (Signed)
Transition of Care [facility]) - Progression Note    Patient Details  Name: Robin [name] MRN: [id number] Date of Birth: [birth year]  Transition of Care [facility]) CM/SW Contact  Salome Arnt, [place] Phone Number: 11/29/2021, 2:32 PM  Clinical Narrative: PT recommending home health. LCSW discussed with daughter, [name] who declines home health services. She is aware to notify TOC if they change their mind. TOC will continue to follow.            Expected Discharge Plan and Services                                                 Social Determinants of Health (SDOH) Interventions    Readmission Risk Interventions     No data to display

## 2021-11-29 NOTE — Assessment & Plan Note (Addendum)
-  Continue beta-blocker, statin, Imdur, aspirin -No chest pain reported. -Telemetry has been without any incidents or ischemic changes over 36 hours; will discontinue.

## 2021-11-29 NOTE — H&P (Signed)
History and Physical    Patient: Robin [name] ZOX:[id number] DOB: 04/05/1940 DOA: 11/28/2021 DOS: the patient was seen and examined on 11/29/2021 PCP: [name], MD  Patient coming from: Home  Chief Complaint:  Chief Complaint  Patient presents with   Emesis   HPI: Robin [name] is a 81 y.o. female with medical history significant of breast cancer, coronary artery disease, cardiomyopathy, essential hypertension, hepatic steatosis, hyperlipidemia, hypothyroidism, stroke with residual left-sided weakness, type 2 diabetes mellitus, and more presents to the ED with a chief complaint of left arm swelling.  Daughter works in Corporate treasurer and is at bedside.  Patient request that we take most of the history from daughter.  Daughter reports that the swelling started about 2 weeks ago.  She was at the cancer center, so Dr. Raliegh Ip had a look at it and got an ultrasound.  Approximately 4 days ago, they were given the results of the ultrasound which was negative for DVT.  Dr. Raliegh Ip told him to get a compression sleeve, but she started to get worse.  Last night she had a fever and her arm became erythematous.  Erythema has been spreading in both directions from her elbow, all the way up to her chest, and down to her hand.  Her Tmax was 101.4.  She was given Tylenol which did control the fever.  Despite having had double mastectomy, patient has never had swelling in her arms like this before.  She reports that the arm is not painful.  The chest is not painful.  She does feel more fatigued and malaise than normal, but attributes it to the new chemo medication.  Last night she had 4 episodes of nonbloody emesis.  Few days ago she had 1 soft BM, that was softer than her BMs since she has been on chemo.  Patient reports that she does have asymmetric weakness but it is chronic since 2001 with her left upper extremity and lower extremity being affected.  Patient has no other complaints at this time.  Patient does not  smoke, does not drink, does not use illicit drugs.  She is vaccinated for COVID.  Patient is full code. Review of Systems: As mentioned in the history of present illness. All other systems reviewed and are negative. Past Medical History:  Diagnosis Date   Breast cancer ([place])    Tamoxifen started 2007   CAD (coronary artery disease)    DES second diagonal 2/12   Cardiomyopathy    Probable Takotsubo, LVEF 30-35% 2/12   Carotid artery disease ([facility])    Essential hypertension    Hepatic steatosis    Hyperlipidemia    Hypothyroidism    NSTEMI (non-ST elevated myocardial infarction) ([place])    2/12   Pancreas divisum    Stroke ([place])    (2000) residual left-sided weakness   Type 2 diabetes mellitus ([place])    reports not currently diabetic   Past Surgical History:  Procedure Laterality Date   ABDOMINAL HYSTERECTOMY     CAROTID ENDARTERECTOMY     Left   CHOLECYSTECTOMY     COLONOSCOPY WITH PROPOFOL N/A 05/03/2020   Procedure: COLONOSCOPY WITH PROPOFOL;  Surgeon: Daneil Dolin, MD;  [facility];  Service: Endoscopy;  Laterality: N/A;   ESOPHAGOGASTRODUODENOSCOPY (EGD) WITH PROPOFOL N/A 05/02/2020   Procedure: ESOPHAGOGASTRODUODENOSCOPY (EGD) WITH PROPOFOL;  Surgeon: Rogene Houston, MD;  Location: [facility];  Service: Endoscopy;  Laterality: N/A;   GIVENS CAPSULE STUDY N/A 05/04/2020   Procedure:  GIVENS CAPSULE STUDY;  Surgeon: Montez Morita, Quillian Quince, MD;  Location: [facility];  Service: Gastroenterology;  Laterality: N/A;   JOINT REPLACEMENT     Right knee replacement- [facility]. [facility]   MASTECTOMY     Bilateral   POLYPECTOMY  05/03/2020   Procedure: POLYPECTOMY;  Surgeon: Daneil Dolin, MD;  Location: [facility];  Service: Endoscopy;;   Social History:  reports that she has never smoked. She has never used smokeless tobacco. She reports that she does not drink alcohol and does not use drugs.  Allergies  Allergen Reactions   Codeine Nausea And Vomiting     Family History  Problem Relation Age of Onset   Heart attack Sister    Heart disease Sister    Heart attack Brother    Heart disease Brother    Throat cancer Brother    Colon cancer Neg Hx     Prior to Admission medications   Medication Sig Start Date End Date Taking? Authorizing Provider  acetaminophen (TYLENOL) 325 MG tablet Take 2 tablets (650 mg total) by mouth every 6 (six) hours as needed for mild pain, fever or headache (or Fever >/= 101). Patient taking differently: Take 325 mg by mouth every 6 (six) hours as needed for mild pain, fever or headache (or Fever >/= 101). 04/06/20  Yes Emokpae, Courage, MD  amLODipine (NORVASC) 10 MG tablet Take 1 tablet (10 mg total) by mouth daily. 04/23/21  Yes [name], MD  anastrozole (ARIMIDEX) 1 MG tablet TAKE 1 TABLET BY MOUTH EVERY DAY 08/20/21  Yes Derek Jack, MD  aspirin 81 MG tablet Take 1 tablet (81 mg total) by mouth daily with breakfast. 04/06/20  Yes Emokpae, Courage, MD  carvedilol (COREG) 12.5 MG tablet TAKE 1 TABLET (12.5MG TOTAL) BY MOUTH TWICE A DAY WITH MEALS Patient taking differently: Take 12.5 mg by mouth 2 (two) times daily with a meal. 11/18/21  Yes [name], MD  cetirizine (ZYRTEC) 10 MG tablet Take 1 tablet (10 mg total) by mouth daily. 05/07/21  Yes [name], MD  Cholecalciferol (VITAMIN D3) 125 MCG (5000 UT) CAPS TAKE 1 CAPSULE BY MOUTH ONCE DAILY Patient taking differently: Take 1 capsule by mouth daily. 01/14/18  Yes Nida, Marella Chimes, MD  dicyclomine (BENTYL) 10 MG capsule Take 1 capsule (10 mg total) by mouth 3 (three) times daily as needed for spasms. 11/27/20  Yes Patel, Colin Broach, MD  ELIQUIS 5 MG TABS tablet TAKE 1 TABLET BY MOUTH TWICE A DAY 11/18/21  Yes Derek Jack, MD  ferrous sulfate 325 (65 FE) MG tablet Take 1 tablet (325 mg total) by mouth daily with breakfast. 05/06/20  Yes Cristal Deer, MD  fluticasone (FLONASE) 50 MCG/ACT nasal spray Place 2 sprays into both  nostrils daily. 05/07/21  Yes [name], MD  furosemide (LASIX) 20 MG tablet TAKE 1 TABLET BY MOUTH EVERY DAY AS NEEDED AS DIRECTED Patient taking differently: Take 20 mg by mouth daily as needed for fluid. 12/17/20  Yes [name], MD  gabapentin (NEURONTIN) 100 MG capsule TAKE 1 CAPSULE BY MOUTH AT BEDTIME. 11/04/21  Yes [name], MD  isosorbide mononitrate (IMDUR) 30 MG 24 hr tablet Take 1 tablet (30 mg total) by mouth daily. 06/14/21  Yes [name], MD  KLOR-CON M10 10 MEQ tablet TAKE 1 TABLET BY MOUTH EVERY DAY 07/11/21  Yes [name], MD  levothyroxine (SYNTHROID) 125 MCG tablet TAKE 1 TABLET BY MOUTH  DAILY BEFORE BREAKFAST. 08/20/21  Yes Derek Jack, MD  nitroGLYCERIN (NITROSTAT) 0.4 MG SL tablet Place 1 tablet (0.4 mg total) under the tongue every 5 (five) minutes x 3 doses as needed. 01/11/14  Yes Satira Sark, MD  Omega-3 Fatty Acids 300 MG CAPS Take 1 capsule (300 mg total) by mouth 2 (two) times daily. Patient taking differently: Take 1 capsule by mouth daily. 04/24/11  Yes Serpe, Burna Forts, PA-C  palbociclib (IBRANCE) 75 MG tablet Take 1 tablet (75 mg total) by mouth daily. Take for 21 days on, 7 days off, repeat every 28 days. 10/28/21  Yes Derek Jack, MD  pantoprazole (PROTONIX) 40 MG tablet Take 1 tablet (40 mg total) by mouth daily. 06/24/21  Yes [name], MD  prochlorperazine (COMPAZINE) 10 MG tablet Take 1 tablet (10 mg total) by mouth every 6 (six) hours as needed for nausea or vomiting. 11/28/21  Yes [name], MD  rosuvastatin (CRESTOR) 40 MG tablet TAKE 1 TABLET BY MOUTH EVERY DAY 11/27/21  Yes [name], MD  traMADol (ULTRAM) 50 MG tablet Take 1 tablet (50 mg total) by mouth every 8 (eight) hours as needed. 07/29/21  Yes Derek Jack, MD  ALPRAZolam Duanne Moron) 0.25 MG tablet Take 1 tablet (0.25 mg total) by mouth 2 (two) times daily as needed for anxiety. Patient not taking: Reported on 11/28/2021 07/29/21    Derek Jack, MD  Misc. Devices MISC Please provide with left upper extremity lymphedema sleeve 11/25/21   Derek Jack, MD    Physical Exam: Vitals:   11/28/21 2143 11/28/21 2200 11/28/21 2230 11/28/21 2330  BP:  (!) 162/63 (!) 166/61 (!) 169/59  Pulse:  73 73 71  Resp:  _0 Temp: 98.4 F (36.9 C)     TempSrc: Oral     SpO2:  97% 97% 95%  Weight:      Height:       1.  General: Patient lying supine in bed,  no acute distress   2. Psychiatric: Alert and oriented x 3, mood and behavior normal for situation, pleasant and cooperative with exam   3. Neurologic: Speech and language are normal, face is symmetric, left upper and lower extremity are chronically weak compared to right secondary to previous stroke, at baseline without acute deficits on limited exam   4. HEENMT:  Head is atraumatic, normocephalic, pupils reactive to light, neck is supple, trachea is midline, mucous membranes are moist   5. Respiratory : Lungs are clear to auscultation bilaterally without wheezing, rhonchi, rales, no cyanosis, no increase in work of breathing or accessory muscle use   6. Cardiovascular : Heart rate normal, rhythm is regular, murmur present, rubs or gallops, peripheral edema present in the lower extremities with compression stockings on, peripheral pulses palpated   7. Gastrointestinal:  Abdomen is soft, nondistended, nontender to palpation bowel sounds active, no masses or organomegaly palpated   8. Skin:  Left arm is erythematous and edematous, with moisture present in the cubital fossa   9.Musculoskeletal:  No acute deformities or trauma, no asymmetry in tone, peripheral edema present, peripheral pulses palpated, no tenderness to palpation in the extremities  Data Reviewed: In the ED Temp 98.4, heart rate 73-81, respiratory rate 16-20, blood pressure 160/55-179/83, satting 95-99% Leukopenia with a white blood cell count of 2.4-on chemo Hemoglobin low at 7.0,  previously 7.4, 3 days ago Platelets also low at 96, previously 102 Chemistry shows a hypokalemia 3.0 and a low bicarb at  19  Alk phos 160  Ultrasound left arm on October 26 shows no DVT Patient was given a 500 mL LR bolus and continued on LR 150 mL/h She is given 60 mEq of potassium Started on vancomycin Admission was requested for cellulitis  Assessment and Plan: * Cellulitis - Left arm edema and erythema - Likely the inciting event was yeast dermatitis in the antecubital fossa - Nystatin powder to antecubital fossa - Vancomycin started in the ED, continue until MRSA swab is negative, then de-escalate - Patient did have ultrasound of the left upper extremity October 26 which revealed no DVT - Patient is leukopenia at 2.4, but does not otherwise meet any SIRS/sepsis criteria - Patient does have a history of bilateral mastectomy and stage IV breast cancer - Continue to monitor  Hypokalemia - Replace and recheck in a.m.  Pancytopenia ([place]) - Follows with Dr. Raliegh Ip - Most likely related to chemo - White blood cell count 2.4, hemoglobin 7.0, platelets 96 - Transfuse for hemoglobin less than 7 - Trend in the a.m.  History of DVT (deep vein thrombosis) - Continue Eliquis  Hypocalcemia - Calcium level on initial labs - 1 g calcium gluconate given - Trend with a.m. labs  Generalized weakness - PT eval and treat - Check TSH - Most likely related to chemo treatments and now cellulitis as well - Continue treatment of cellulitis - Continue to monitor  Yeast dermatitis - Continue nystatin powder  Gastroesophageal reflux disease - Continue Protonix  Hypothyroidism - Continue Synthroid - Check TSH  Essential hypertension, benign - Continue Norvasc, Coreg - Continue to monitor  Coronary atherosclerosis of native coronary artery - Continue beta-blocker, statin, Imdur, aspirin      Advance Care Planning:   Code Status: Full Code   Consults: None  Family Communication:  Daughter at bedside Severity of Illness: The appropriate patient status for this patient is OBSERVATION. Observation status is judged to be reasonable and necessary in order to provide the required intensity of service to ensure the patient's safety. The patient's presenting symptoms, physical exam findings, and initial radiographic and laboratory data in the context of their medical condition is felt to place them at decreased risk for further clinical deterioration. Furthermore, it is anticipated that the patient will be medically stable for discharge from the hospital within 2 midnights of admission.   Author: Rolla Plate, DO 11/29/2021 1:49 AM  For on call review [email or web address].

## 2021-11-29 NOTE — Assessment & Plan Note (Signed)
-  Repleted. ?-Continue to follow electrolytes trend.   ?

## 2021-11-29 NOTE — Assessment & Plan Note (Addendum)
-   Continue Norvasc, Coreg - Continue to monitor -Blood pressure stable. -Heart healthy diet discussed with patient.

## 2021-11-29 NOTE — Progress Notes (Signed)
Patient seen and examined; admitted after midnight secondary to left arm swelling and erythematous changes suggesting cellulitic process.  Patient didn't meet sepsis criteria at time of admission.  Was also found with nausea/vomiting and inability to keep things down.  On today's blood work her hemoglobin is down to 6.6, and she is expressing generalized weakness.  Otherwise hemodynamically stable.  Please refer to H&P written by Dr. Clearence Ped on 11/29/21 for further info/details on admission.  Plan: -PRBC transfusion will be provided; follow hemoglobin trend. -Holding aspirin and Eliquis currently -Continue PPI and check fecal occult blood test. -Continue current antibiotics for cellulitic process, maintain adequate hydration and provide supportive care. -follow clinical response.  Barton Dubois MD [phone]

## 2021-11-29 NOTE — Plan of Care (Signed)
  Problem: Acute Rehab PT Goals(only PT should resolve) Goal: Pt Will Go Supine/Side To Sit Outcome: Progressing Flowsheets (Taken 11/29/2021 1534) Pt will go Supine/Side to Sit:  with min guard assist  with minimal assist Goal: Patient Will Transfer Sit To/From Stand Outcome: Progressing Flowsheets (Taken 11/29/2021 1534) Patient will transfer sit to/from stand:  with min guard assist  with minimal assist Goal: Pt Will Transfer Bed To Chair/Chair To Bed Outcome: Progressing Flowsheets (Taken 11/29/2021 1534) Pt will Transfer Bed to Chair/Chair to Bed: min guard assist Goal: Pt Will Ambulate Outcome: Progressing Flowsheets (Taken 11/29/2021 1534) Pt will Ambulate:  50 feet  with min guard assist  with rolling walker  with supervision   3:35 PM, 11/29/21 Lonell Grandchild, MPT Physical Therapist with [facility] [phone] office [phone] mobile phone

## 2021-11-30 DIAGNOSIS — R7881 Bacteremia: Secondary | ICD-10-CM | POA: Diagnosis present

## 2021-11-30 DIAGNOSIS — Z8249 Family history of ischemic heart disease and other diseases of the circulatory system: Secondary | ICD-10-CM | POA: Diagnosis not present

## 2021-11-30 DIAGNOSIS — B955 Unspecified streptococcus as the cause of diseases classified elsewhere: Secondary | ICD-10-CM

## 2021-11-30 DIAGNOSIS — Z7982 Long term (current) use of aspirin: Secondary | ICD-10-CM | POA: Diagnosis not present

## 2021-11-30 DIAGNOSIS — E876 Hypokalemia: Secondary | ICD-10-CM | POA: Diagnosis present

## 2021-11-30 DIAGNOSIS — D696 Thrombocytopenia, unspecified: Secondary | ICD-10-CM

## 2021-11-30 DIAGNOSIS — E785 Hyperlipidemia, unspecified: Secondary | ICD-10-CM | POA: Diagnosis present

## 2021-11-30 DIAGNOSIS — I10 Essential (primary) hypertension: Secondary | ICD-10-CM | POA: Diagnosis present

## 2021-11-30 DIAGNOSIS — Z79899 Other long term (current) drug therapy: Secondary | ICD-10-CM | POA: Diagnosis not present

## 2021-11-30 DIAGNOSIS — E119 Type 2 diabetes mellitus without complications: Secondary | ICD-10-CM | POA: Diagnosis present

## 2021-11-30 DIAGNOSIS — K219 Gastro-esophageal reflux disease without esophagitis: Secondary | ICD-10-CM | POA: Diagnosis present

## 2021-11-30 DIAGNOSIS — I252 Old myocardial infarction: Secondary | ICD-10-CM | POA: Diagnosis not present

## 2021-11-30 DIAGNOSIS — T451X5A Adverse effect of antineoplastic and immunosuppressive drugs, initial encounter: Secondary | ICD-10-CM | POA: Diagnosis present

## 2021-11-30 DIAGNOSIS — E669 Obesity, unspecified: Secondary | ICD-10-CM | POA: Diagnosis present

## 2021-11-30 DIAGNOSIS — L03114 Cellulitis of left upper limb: Secondary | ICD-10-CM | POA: Diagnosis present

## 2021-11-30 DIAGNOSIS — B372 Candidiasis of skin and nail: Secondary | ICD-10-CM | POA: Diagnosis present

## 2021-11-30 DIAGNOSIS — I69354 Hemiplegia and hemiparesis following cerebral infarction affecting left non-dominant side: Secondary | ICD-10-CM | POA: Diagnosis not present

## 2021-11-30 DIAGNOSIS — C50911 Malignant neoplasm of unspecified site of right female breast: Secondary | ICD-10-CM | POA: Diagnosis present

## 2021-11-30 DIAGNOSIS — R531 Weakness: Secondary | ICD-10-CM | POA: Diagnosis present

## 2021-11-30 DIAGNOSIS — D649 Anemia, unspecified: Secondary | ICD-10-CM | POA: Diagnosis not present

## 2021-11-30 DIAGNOSIS — D61810 Antineoplastic chemotherapy induced pancytopenia: Secondary | ICD-10-CM | POA: Diagnosis present

## 2021-11-30 DIAGNOSIS — C50912 Malignant neoplasm of unspecified site of left female breast: Secondary | ICD-10-CM | POA: Diagnosis present

## 2021-11-30 DIAGNOSIS — E039 Hypothyroidism, unspecified: Secondary | ICD-10-CM | POA: Diagnosis present

## 2021-11-30 DIAGNOSIS — C7951 Secondary malignant neoplasm of bone: Secondary | ICD-10-CM | POA: Diagnosis present

## 2021-11-30 DIAGNOSIS — K760 Fatty (change of) liver, not elsewhere classified: Secondary | ICD-10-CM | POA: Diagnosis present

## 2021-11-30 LAB — BPAM RBC
Blood Product Expiration Date: 202312132359
Blood Product Expiration Date: 202312132359
ISSUE DATE / TIME: 202311100748
ISSUE DATE / TIME: 202311101145
Unit Type and Rh: 5100
Unit Type and Rh: 5100

## 2021-11-30 LAB — TYPE AND SCREEN
ABO/RH(D): O POS
Antibody Screen: NEGATIVE
Unit division: 0
Unit division: 0

## 2021-11-30 MED ORDER — SODIUM CHLORIDE 0.9 % IV SOLN
2.0000 g | INTRAVENOUS | Status: DC
  Administered 2021-11-30 – 2021-12-03 (×4): 2 g via INTRAVENOUS
  Filled 2021-11-30 (×4): qty 20

## 2021-11-30 NOTE — Assessment & Plan Note (Signed)
-  Positive blood cultures for Streptococcus species -Final speciation and sensitivity pending -Antibiotics has been tailored and adjusted based on culture results -Will check 2D echo -Follow ID service recommendations.

## 2021-11-30 NOTE — Progress Notes (Signed)
Progress Note   Patient: Robin [name] YHC:[id number] DOB: Apr 05, 1940 DOA: 11/28/2021     0 DOS: the patient was seen and examined on 11/30/2021   Brief hospital course: As per H&P written by Dr. Clearence Ped On 11/29/2021 Robin [name] is a 81 y.o. female with medical history significant of breast cancer, coronary artery disease, cardiomyopathy, essential hypertension, hepatic steatosis, hyperlipidemia, hypothyroidism, stroke with residual left-sided weakness, type 2 diabetes mellitus, and more presents to the ED with a chief complaint of left arm swelling.  Daughter works in [employer] treasure[employer] and is at bedside.  Patient request that we take most of the history from daughter.  Daughter reports that the swelling started about 2 weeks ago.  She was at the cancer center, so Dr. Raliegh Ip had a look at it and got an ultrasound.  Approximately 4 days ago, they were given the results of the ultrasound which was negative for DVT.  Dr. Raliegh Ip told him to get a compression sleeve, but she started to get worse.  Last night she had a fever and her arm became erythematous.  Erythema has been spreading in both directions from her elbow, all the way up to her chest, and down to her hand.  Her Tmax was 101.4.  She was given Tylenol which did control the fever.  Despite having had double mastectomy, patient has never had swelling in her arms like this before.  She reports that the arm is not painful.  The chest is not painful.  She does feel more fatigued and malaise than normal, but attributes it to the new chemo medication.  Last night she had 4 episodes of nonbloody emesis.  Few days ago she had 1 soft BM, that was softer than her BMs since she has been on chemo.  Patient reports that she does have asymmetric weakness but it is chronic since 2001 with her left upper extremity and lower extremity being affected.  Patient has no other complaints at this time.   Patient does not smoke, does not drink, does not use illicit drugs.  She  is vaccinated for COVID.  Patient is full code. Assessment and Plan: * Cellulitis -Left arm edema and erythema appreciated at time of admission; improving currently. - Maintain limb elevated -Keep area clean and dry -Continue IV antibiotics -MRSA swab negative. -Left upper extremity ultrasound prior to admission reveal no presence of DVT. - Continue supportive care and follow clinical response.  Bacteremia due to Streptococcus -Positive blood cultures for Streptococcus species -Final speciation and sensitivity pending -Antibiotics has been tailored and adjusted based on culture results -Will check 2D echo -Follow ID service recommendations.  Hypokalemia - Repleted -Continue to follow electrolytes trend.  Pancytopenia ([place]) - Follows with Dr. Raliegh Ip - Most likely related to chemo - White blood cell count 1.7, hemoglobin 9.3 (after receiving 2 units PRBCs for hemoglobin of 6.6 throughout this admission).   -Platelet count in the 90,000 range.   -Repeat CBC in AM.  History of DVT (deep vein thrombosis) - Anticoagulation has been stopped in the setting of worsening anemia -Patient is status posttransfusion with adequate response. -If things remains stable will resume Eliquis on 12/01/2021.  Hypocalcemia - IV calcium gluconate given at time of admission -Continue close outpatient follow-up of calcium levels.  Generalized weakness - Most likely related to chemo treatments and now cellulitis as well - Continue treatment of cellulitis - Physical therapy has seen patient and has recommended home health PT at time of discharge.  Yeast dermatitis -  Continue nystatin powder  Gastroesophageal reflux disease -Continue Protonix  Hypothyroidism - Continue Synthroid - TSH borderline elevated; could be in the setting of sick thyroid syndrome -Will recommend repeat thyroid panel as an outpatient after acute infection process resolved.  Essential hypertension, benign - Continue Norvasc,  Coreg - Continue to monitor -Blood pressure stable. -Heart healthy diet discussed with patient.  Coronary atherosclerosis of native coronary artery -Continue beta-blocker, statin, Imdur, aspirin -No chest pain reported. -Telemetry has been without any incidents or ischemic changes over 36 hours; will discontinue.  Subjective:  Afebrile, no chest pain, no nausea, no vomiting, no palpitations.  Reports feeling better overall improving.  Still with pain, swelling and erythematous changes in her left upper extremity.  Physical Exam: Vitals:   11/29/21 2230 11/30/21 0229 11/30/21 0848 11/30/21 1430  BP: (!) 132/45 (!) 127/40 (!) 167/64 (!) 128/44  Pulse: 60 (!) 57 60 (!) 57  Resp: '16 16  17  '$ Temp: 98.5 F (36.9 C) 98.4 F (36.9 C)  98.2 F (36.8 C)  TempSrc: Oral Oral  Oral  SpO2: 99% 98%  96%  Weight:      Height:       General exam: Alert, awake, oriented x 3; in good spirit.  Reporting improvement in the pain and erythematous changes on her left upper extremity.  No chest pain, no nausea, no vomiting.  Patient currently afebrile. Respiratory system: Clear to auscultation. Respiratory effort normal.  Good saturation on room air. Cardiovascular system:RRR. No murmurs, rubs, gallops. Gastrointestinal system: Abdomen is obese, nondistended, soft and nontender. No organomegaly or masses felt. Normal bowel sounds heard. Central nervous system: Alert and oriented. No focal neurological deficits. Extremities: No cyanosis or clubbing. Skin: No petechiae.  Healing up cellulitic changes appreciated on left upper extremity (swelling, warmth sensation and tenderness reported). Psychiatry: Judgement and insight appear normal. Mood & affect appropriate.   Data Reviewed: CBC: WBCs 1.7, hemoglobin 9.2 and platelet count 91 K  Family Communication: Family at bedside.  Disposition: Status is: Inpatient Remains inpatient appropriate because: Continue IV antibiotics and rule out the presence of  vegetations given positive blood cultures.   Planned Discharge Destination: Home  Time spent: 40 minutes  Author: Barton Dubois, MD 11/30/2021 3:01 PM  For on call review [email or web address].

## 2021-11-30 NOTE — Progress Notes (Signed)
Date and time results received: 11/30/21 1910 (use smartphrase ".now" to insert current time)  Test: Blood culture Critical Value: 1/2 bottles positive for strep species, gram positive cocci  Name of Provider Notified: Dr. Orlin Hilding  Orders Received? Or Actions Taken?:  NNO at this time, pt is already of Vancomycin

## 2021-12-01 ENCOUNTER — Inpatient Hospital Stay (HOSPITAL_COMMUNITY): Payer: Medicare PPO

## 2021-12-01 DIAGNOSIS — R7881 Bacteremia: Secondary | ICD-10-CM | POA: Diagnosis not present

## 2021-12-01 LAB — CBC
HCT: 28.8 % — ABNORMAL LOW (ref 36.0–46.0)
Hemoglobin: 8.9 g/dL — ABNORMAL LOW (ref 12.0–15.0)
MCH: 28.3 pg (ref 26.0–34.0)
MCHC: 30.9 g/dL (ref 30.0–36.0)
MCV: 91.4 fL (ref 80.0–100.0)
Platelets: 98 10*3/uL — ABNORMAL LOW (ref 150–400)
RBC: 3.15 MIL/uL — ABNORMAL LOW (ref 3.87–5.11)
RDW: 26.9 % — ABNORMAL HIGH (ref 11.5–15.5)
WBC: 1.5 10*3/uL — ABNORMAL LOW (ref 4.0–10.5)
nRBC: 0 % (ref 0.0–0.2)

## 2021-12-01 LAB — BASIC METABOLIC PANEL
Anion gap: 4 — ABNORMAL LOW (ref 5–15)
BUN: 10 mg/dL (ref 8–23)
CO2: 20 mmol/L — ABNORMAL LOW (ref 22–32)
Calcium: 7.9 mg/dL — ABNORMAL LOW (ref 8.9–10.3)
Chloride: 112 mmol/L — ABNORMAL HIGH (ref 98–111)
Creatinine, Ser: 0.9 mg/dL (ref 0.44–1.00)
GFR, Estimated: >60 mL/min (ref >60)
Glucose, Bld: 103 mg/dL — ABNORMAL HIGH (ref 70–99)
Potassium: 3.4 mmol/L — ABNORMAL LOW (ref 3.5–5.1)
Sodium: 136 mmol/L (ref 135–145)

## 2021-12-01 LAB — ECHOCARDIOGRAM COMPLETE
Area-P 1/2: 3.2 cm2
Calc EF: 56 %
Height: 66 in
S' Lateral: 3 cm
Single Plane A2C EF: 56.8 %
Single Plane A4C EF: 56.7 %
Weight: 3216 oz

## 2021-12-01 NOTE — Progress Notes (Signed)
Echocardiogram 2D Echocardiogram has been performed.  Robin [name] 12/01/2021, 1:49 PM

## 2021-12-01 NOTE — Progress Notes (Signed)
Progress Note   Patient: Robin [name] DOB: 1940-08-08 DOA: 11/28/2021     1 DOS: the patient was seen and examined on 12/01/2021   Brief hospital course: As per H&P written by Dr. Clearence Ped On 11/29/2021 Robin [name] is a 81 y.o. female with medical history significant of breast cancer, coronary artery disease, cardiomyopathy, essential hypertension, hepatic steatosis, hyperlipidemia, hypothyroidism, stroke with residual left-sided weakness, type 2 diabetes mellitus, and more presents to the ED with a chief complaint of left arm swelling.  Daughter works in [employer] treasure[employer] and is at bedside.  Patient request that we take most of the history from daughter.  Daughter reports that the swelling started about 2 weeks ago.  She was at the cancer center, so Dr. Raliegh Ip had a look at it and got an ultrasound.  Approximately 4 days ago, they were given the results of the ultrasound which was negative for DVT.  Dr. Raliegh Ip told him to get a compression sleeve, but she started to get worse.  Last night she had a fever and her arm became erythematous.  Erythema has been spreading in both directions from her elbow, all the way up to her chest, and down to her hand.  Her Tmax was 101.4.  She was given Tylenol which did control the fever.  Despite having had double mastectomy, patient has never had swelling in her arms like this before.  She reports that the arm is not painful.  The chest is not painful.  She does feel more fatigued and malaise than normal, but attributes it to the new chemo medication.  Last night she had 4 episodes of nonbloody emesis.  Few days ago she had 1 soft BM, that was softer than her BMs since she has been on chemo.  Patient reports that she does have asymmetric weakness but it is chronic since 2001 with her left upper extremity and lower extremity being affected.  Patient has no other complaints at this time.   Patient does not smoke, does not drink, does not use illicit drugs.  She  is vaccinated for COVID.  Patient is full code. Assessment and Plan: * Cellulitis -Left arm edema and erythema appreciated at time of admission; improving currently. - Maintain limb elevated -Keep area clean and dry -Continue IV antibiotics -MRSA swab negative. -Left upper extremity ultrasound prior to admission reveal no presence of DVT. - Continue supportive care and follow clinical response.  Bacteremia due to Streptococcus -Positive blood cultures for Streptococcus species -Final speciation and sensitivity pending -Antibiotics has been tailored and adjusted based on culture results -2D echo not demonstrating vegetations -Following ID service recommendations (Dr. Graylon Good) blood cultures will be repeated. -The pending results and sensitivity may be able to be treated with oral antibiotics at time of discharge.  Hypokalemia - Repleted -Continue to follow electrolytes trend.  Pancytopenia ([place]) - Follows with Dr. Raliegh Ip - Most likely related to chemo - White blood cell count 1.7, hemoglobin 9.3 (after receiving 2 units PRBCs for hemoglobin of 6.6 throughout this admission).   -Platelet count in the 90,000 range.   -Repeat CBC in AM.  History of DVT (deep vein thrombosis) - Anticoagulation has been stopped in the setting of worsening anemia -Patient is status posttransfusion with adequate response. -If things remains stable will resume Eliquis on 12/01/2021.  Hypocalcemia - IV calcium gluconate given at time of admission -Continue close outpatient follow-up of calcium levels.  Generalized weakness - Most likely related to chemo treatments and now cellulitis as  well - Continue treatment of cellulitis - Physical therapy has seen patient and has recommended home health PT at time of discharge.  Yeast dermatitis - Continue nystatin powder  Gastroesophageal reflux disease -Continue Protonix  Hypothyroidism - Continue Synthroid - TSH borderline elevated; could be in the setting  of sick thyroid syndrome -Will recommend repeat thyroid panel as an outpatient after acute infection process resolved.  Essential hypertension, benign - Continue Norvasc, Coreg - Continue to monitor -Blood pressure stable. -Heart healthy diet discussed with patient.  Coronary atherosclerosis of native coronary artery -Continue beta-blocker, statin, Imdur, aspirin -No chest pain reported. -Telemetry has been without any incidents or ischemic changes over 36 hours; will discontinue.  Subjective:  No fever, no chest pain, no nausea, no vomiting.  No overt bleeding reported.  Cellulitic process in her left upper extremity demonstrating significant improvement.  Patient in no acute distress.  Physical Exam: Vitals:   12/01/21 0525 12/01/21 0929 12/01/21 1452 12/01/21 1624  BP: (!) 148/53 (!) 162/52 (!) 130/43 (!) 137/53  Pulse: (!) 58 60 (!) 56 60  Resp: 16  18   Temp: 99.1 F (37.3 C)  98.2 F (36.8 C)   TempSrc:   Oral   SpO2: 98%  97%   Weight:      Height:       General exam: Alert, awake, oriented x 3; no chest pain, no nausea, no vomiting, no shortness of breath.  Overall feeling much better.  No overt bleeding reported. Respiratory system: Clear to auscultation. Respiratory effort normal.  Good saturation on room air. Cardiovascular system:RRR. No murmurs, rubs, gallops. Gastrointestinal system: Abdomen is obese, nondistended, soft and nontender. No organomegaly or masses felt. Normal bowel sounds heard. Central nervous system: Alert and oriented. No focal neurological deficits. Extremities: No cyanosis or clubbing; left upper extremity demonstrating significant improvement in swelling, pain and erythematous changes. Skin: No petechiae. Psychiatry: Judgement and insight appear normal. Mood & affect appropriate.   Data Reviewed: CBC: WBCs 1.5, hemoglobin 8.9 and platelet count 98 K Basic metabolic panel: Sodium 016, potassium 3.4, chloride 112, bicarb 20, BUN 10 and  creatinine 0.90. 2D echo: Preserved ejection fraction, grade 1 diastolic dysfunction appreciated.  No wall motion abnormality.  There is no evidence of valvular vegetation.  Family Communication: Family at bedside.  Disposition: Status is: Inpatient Remains inpatient appropriate because: Continue IV antibiotics and rule out the presence of vegetations given positive blood cultures.   Planned Discharge Destination: Home  Time spent: 40 minutes  Author: Barton Dubois, MD 12/01/2021 4:53 PM  For on call review [email or web address].

## 2021-12-01 NOTE — Progress Notes (Signed)
Pt has rested well this night, no complaints of pain.  Pt states redness to arm has been improving.  Baseline neuropathy present but no worsening of symptoms.

## 2021-12-02 LAB — CBC
HCT: 28.7 % — ABNORMAL LOW (ref 36.0–46.0)
Hemoglobin: 9.2 g/dL — ABNORMAL LOW (ref 12.0–15.0)
MCH: 28.9 pg (ref 26.0–34.0)
MCHC: 32.1 g/dL (ref 30.0–36.0)
MCV: 90.3 fL (ref 80.0–100.0)
Platelets: 91 10*3/uL — ABNORMAL LOW (ref 150–400)
RBC: 3.18 MIL/uL — ABNORMAL LOW (ref 3.87–5.11)
RDW: 27.2 % — ABNORMAL HIGH (ref 11.5–15.5)
WBC: 1.7 10*3/uL — ABNORMAL LOW (ref 4.0–10.5)
nRBC: 0 % (ref 0.0–0.2)

## 2021-12-02 NOTE — Progress Notes (Signed)
Progress Note   Patient: Robin [name] KGM:[id number] DOB: [birth year] DOA: 11/28/2021     2 DOS: the patient was seen and examined on 12/02/2021   Brief hospital course: As per H&P written by Dr. Clearence Ped On 11/29/2021 Robin [name] is a 81 y.o. female with medical history significant of breast cancer, coronary artery disease, cardiomyopathy, essential hypertension, hepatic steatosis, hyperlipidemia, hypothyroidism, stroke with residual left-sided weakness, type 2 diabetes mellitus, and more presents to the ED with a chief complaint of left arm swelling.  Daughter works in [employer] treasure[employer] and is at bedside.  Patient request that we take most of the history from daughter.  Daughter reports that the swelling started about 2 weeks ago.  She was at the cancer center, so Robin [name] had a look at it and got an ultrasound.  Approximately 4 days ago, they were given the results of the ultrasound which was negative for DVT.  Robin [name] told him to get a compression sleeve, but she started to get worse.  Last night she had a fever and her arm became erythematous.  Erythema has been spreading in both directions from her elbow, all the way up to her chest, and down to her hand.  Her Tmax was 101.4.  She was given Tylenol which did control the fever.  Despite having had double mastectomy, patient has never had swelling in her arms like this before.  She reports that the arm is not painful.  The chest is not painful.  She does feel more fatigued and malaise than normal, but attributes it to the new chemo medication.  Last night she had 4 episodes of nonbloody emesis.  Few days ago she had 1 soft BM, that was softer than her BMs since she has been on chemo.  Patient reports that she does have asymmetric weakness but it is chronic since 2001 with her left upper extremity and lower extremity being affected.  Patient has no other complaints at this time.   Patient does not smoke, does not drink, does not use illicit drugs.  She  is vaccinated for COVID.  Patient is full code.  Assessment and Plan: * Cellulitis -Left arm edema and erythema appreciated at time of admission; improving currently. - Maintain limb elevated -Keep area clean and dry -Continue IV antibiotics -MRSA swab negative. -Left upper extremity ultrasound prior to admission reveal no presence of DVT. - Continue supportive care and follow clinical response.  Bacteremia due to Streptococcus -Positive blood cultures for Streptococcus species -Final speciation and sensitivity pending -Antibiotics has been tailored and adjusted based on culture results -2D echo not demonstrating vegetations -Following ID service recommendations (Dr. Baxter Flattery) blood cultures repeated, so far negative (we need 24 hours w/o growth). -The pending results and sensitivity may be able to be treated with oral antibiotics at time of discharge.  Hypokalemia - Repleted and stable -Continue to follow electrolytes trend.  Pancytopenia ([place]) -continue outpatient follow up with Robin [name] - Most likely related to chemo - White blood cell count 1.7, hemoglobin 9.3 (after receiving 2 units PRBCs for hemoglobin of 6.6 throughout this admission).   -Platelet count in the 90,000 range.   -Repeat CBC in AM.  History of DVT (deep vein thrombosis) - Anticoagulation has been stopped in the setting of worsening anemia -Patient is status posttransfusion with adequate response. -If things remains stable will resume Eliquis on 12/01/2021.  Hypocalcemia - IV calcium gluconate given at time of admission -Continue close outpatient follow-up of calcium levels.  Generalized  weakness - Most likely related to chemo treatments and now cellulitis as well - Continue treatment of cellulitis - Physical therapy has seen patient and has recommended home health PT at time of discharge. -Family able to assist patient.  Yeast dermatitis - Continue nystatin powder  Gastroesophageal reflux  disease -Continue Protonix  Hypothyroidism - Continue Synthroid - TSH borderline elevated; could be in the setting of sick thyroid syndrome -Will recommend repeat thyroid panel as an outpatient after acute infection process resolved.  Essential hypertension, benign - Continue Norvasc, Coreg - Continue to monitor -Blood pressure stable. -Heart healthy diet discussed with patient.  Coronary atherosclerosis of native coronary artery -Continue beta-blocker, statin, Imdur, aspirin -No chest pain reported. -Telemetry has been without any incidents or ischemic changes over 36 hours; will discontinue.  Subjective:  Afebrile, no chest pain, no nausea or vomiting.  Reports feeling better and demonstrating improvement/almost complete resolution of her left upper extremity cellulitic process.  Physical Exam: Vitals:   12/02/21 0538 12/02/21 0810 12/02/21 1444 12/02/21 1501  BP: (!) 148/62 (!) 156/59 (!) 139/49   Pulse: (!) 57 60 61   Resp: '15 13 20   '$ Temp: 98.2 F (36.8 C) 98.6 F (37 C) 98.9 F (37.2 C)   TempSrc: Oral Oral    SpO2: 100% 100% 98% 92%  Weight:      Height:       General exam: Alert, awake, oriented x 3; no CP, no SOB, no nausea or vomiting. Respiratory system: Clear to auscultation. Respiratory effort normal. Good sat on RA. Cardiovascular system:RRR. No rubs or gallops Gastrointestinal system: Abdomen is obese, nondistended, soft and nontender. No organomegaly or masses felt. Normal bowel sounds heard. Central nervous system: Alert and oriented. No focal neurological deficits. Extremities: No cyanosis or clubbing; almost complete resolution of the left upper extremity cellulitic process appreciated. Skin: No petechiae. Psychiatry: Judgement and insight appear normal. Mood & affect appropriate.   Data Reviewed: CBC: WBCs 1.5, hemoglobin 8.9 and platelet count 98 K Basic metabolic panel: Sodium 262, potassium 3.4, chloride 112, bicarb 20, BUN 10 and creatinine  0.90. 2D echo: Preserved ejection fraction, grade 1 diastolic dysfunction appreciated.  No wall motion abnormality.  There is no evidence of valvular vegetation.  Family Communication: Family at bedside.  Disposition: Status is: Inpatient Remains inpatient appropriate because: Continue IV antibiotics and rule out the presence of vegetations given positive blood cultures.   Planned Discharge Destination: Home  Time spent: 40 minutes  Author: Barton Dubois, MD 12/02/2021 3:38 PM  For on call review [email or web address]

## 2021-12-03 LAB — CULTURE, BLOOD (ROUTINE X 2): Special Requests: ADEQUATE

## 2021-12-03 MED ORDER — FLUCONAZOLE 100 MG PO TABS: 100.0000 mg | ORAL_TABLET | Freq: Every day | ORAL | 0 refills | Status: AC

## 2021-12-03 MED ORDER — PALBOCICLIB 75 MG PO TABS: 75.0000 mg | ORAL_TABLET | Freq: Every day | ORAL | 3 refills | Status: DC

## 2021-12-03 MED ORDER — CEFADROXIL 500 MG PO CAPS: 500.0000 mg | ORAL_CAPSULE | Freq: Two times a day (BID) | ORAL | 0 refills | Status: AC

## 2021-12-03 NOTE — Progress Notes (Signed)
Physical Therapy Treatment Patient Details Name: Robin [name] MRN: [id number] DOB: 1940-09-26 Today's Date: 12/03/2021   History of Present Illness Robin [name] is a 81 y.o. female with medical history significant of breast cancer, coronary artery disease, cardiomyopathy, essential hypertension, hepatic steatosis, hyperlipidemia, hypothyroidism, stroke with residual left-sided weakness, type 2 diabetes mellitus, and more presents to the ED with a chief complaint of left arm swelling.  Daughter works in [employer] and is at bedside.  Patient request that we take most of the history from daughter.  Daughter reports that the swelling started about 2 weeks ago.  She was at the cancer center, so Dr. Raliegh Ip had a look at it and got an ultrasound.  Approximately 4 days ago, they were given the results of the ultrasound which was negative for DVT.  Dr. Raliegh Ip told him to get a compression sleeve, but she started to get worse.  Last night she had a fever and her arm became erythematous.  Erythema has been spreading in both directions from her elbow, all the way up to her chest, and down to her hand.  Her Tmax was 101.4.  She was given Tylenol which did control the fever.  Despite having had double mastectomy, patient has never had swelling in her arms like this before.  She reports that the arm is not painful.  The chest is not painful.  She does feel more fatigued and malaise than normal, but attributes it to the new chemo medication.  Last night she had 4 episodes of nonbloody emesis.  Few days ago she had 1 soft BM, that was softer than her BMs since she has been on chemo.  Patient reports that she does have asymmetric weakness but it is chronic since 2001 with her left upper extremity and lower extremity being affected.  Patient has no other complaints at this time.    PT Comments    Patient demonstrates labored movement for sitting up at bedside requiring min assist to move legs off EOB and hand held assist to  elevate trunk. Patient able to complete several LE exercises at bedside with verbal/tactile cueing and AAROM for proper form for seated marching. Patient then able to ambulate in hallway with RW and min guard/supervision, limited mostly due to fatigue. Patient tolerated sitting up in chair after therapy with family in room. Patient will benefit from continued skilled physical therapy in hospital and recommended venue below to increase strength, balance, endurance for safe ADLs and gait.   Recommendations for follow up therapy are one component of a multi-disciplinary discharge planning process, led by the attending physician.  Recommendations may be updated based on patient status, additional functional criteria and insurance authorization.  Follow Up Recommendations  Home health PT     Assistance Recommended at Discharge Set up Supervision/Assistance  Patient can return home with the following A little help with walking and/or transfers;A little help with bathing/dressing/bathroom;Help with stairs or ramp for entrance;Assistance with cooking/housework   Equipment Recommendations  None recommended by PT    Recommendations for Other Services       Precautions / Restrictions Precautions Precautions: Fall Restrictions Weight Bearing Restrictions: No     Mobility  Bed Mobility Overal bed mobility: Needs Assistance Bed Mobility: Supine to Sit     Supine to sit: Min assist     General bed mobility comments: required assistance to move legs off EOB, hand held/min assist to elevate trunk, able to scoot to EOB independently    Transfers Overall  transfer level: Needs assistance Equipment used: Rolling walker (2 wheels) Transfers: Sit to/from Stand Sit to Stand: Min assist           General transfer comment: verbally cued to put feet behind knees to stand, min assist for boost to stand    Ambulation/Gait Ambulation/Gait assistance: Min guard, Supervision Gait Distance (Feet):  45 Feet Assistive device: Rolling walker (2 wheels) Gait Pattern/deviations: Decreased step length - right, Decreased step length - left, Decreased stride length Gait velocity: decreased     General Gait Details: pt with slowed cadence, able to ambulate in hallway with RW/no loss of balance and min guard/supervision, limitied mostly due to fatigue   Stairs             Wheelchair Mobility    Modified Rankin (Stroke Patients Only)       Balance Overall balance assessment: Needs assistance Sitting-balance support: Feet supported, No upper extremity supported Sitting balance-Leahy Scale: Good Sitting balance - Comments: seated EOB   Standing balance support: During functional activity, Bilateral upper extremity supported, Reliant on assistive device for balance Standing balance-Leahy Scale: Fair Standing balance comment: fair/good with RW                            Cognition Arousal/Alertness: Awake/alert Behavior During Therapy: WFL for tasks assessed/performed Overall Cognitive Status: Within Functional Limits for tasks assessed                                          Exercises General Exercises - Lower Extremity Long Arc Quad: AROM, Strengthening, Seated, 10 reps, Both Hip Flexion/Marching: AROM, Strengthening, AAROM, Seated, Both, 5 reps Toe Raises: AROM, Strengthening, 10 reps, Both, Seated Heel Raises: AROM, Strengthening, 10 reps, Both, Seated    General Comments        Pertinent Vitals/Pain Pain Assessment Pain Assessment: No/denies pain    Home Living                          Prior Function            PT Goals (current goals can now be found in the care plan section) Acute Rehab PT Goals Patient Stated Goal: return home with family to assist PT Goal Formulation: With patient/family Time For Goal Achievement: 12/10/21 Potential to Achieve Goals: Good Progress towards PT goals: Progressing toward goals     Frequency    Min 3X/week      PT Plan Current plan remains appropriate    Co-evaluation              AM-PAC PT "6 Clicks" Mobility   Outcome Measure  Help needed turning from your back to your side while in a flat bed without using bedrails?: A Little Help needed moving from lying on your back to sitting on the side of a flat bed without using bedrails?: A Little Help needed moving to and from a bed to a chair (including a wheelchair)?: A Little Help needed standing up from a chair using your arms (e.g., wheelchair or bedside chair)?: A Little Help needed to walk in hospital room?: A Little Help needed climbing 3-5 steps with a railing? : A Lot 6 Click Score: 17    End of Session   Activity Tolerance: Patient tolerated treatment well;Patient limited by fatigue Patient  left: in chair;with call bell/phone within reach;with family/visitor present Nurse Communication: Mobility status PT Visit Diagnosis: Unsteadiness on feet (R26.81);Other abnormalities of gait and mobility (R26.89);Muscle weakness (generalized) (M62.81)     Time: 4970-2637 PT Time Calculation (min) (ACUTE ONLY): 25 min  Charges:  $Gait Training: 8-22 mins $Therapeutic Exercise: 8-22 mins                     Zigmund Gottron, SPT

## 2021-12-03 NOTE — Discharge Summary (Signed)
Physician Discharge Summary   Patient: Robin [name] MRN: [id number] DOB: 26-Dec-1940  Admit date:     11/28/2021  Discharge date: 12/03/21  Discharge Physician: Barton Dubois   PCP: [name], MD   Recommendations at discharge:  Repeat CBC to follow hemoglobin trend/stability Repeat basic metabolic panel to follow electrolytes and renal function. Repeat thyroid panel and further adjust Synthroid dosage if required. Make sure patient has follow-up with collagen service as discussed.  Discharge Diagnoses: Principal Problem:   Cellulitis Active Problems:   Bacteremia due to Streptococcus   Coronary atherosclerosis of native coronary artery   Essential hypertension, benign   Hypothyroidism   Chronic anemia   Gastroesophageal reflux disease   Yeast dermatitis   Generalized weakness   Hypocalcemia   History of DVT (deep vein thrombosis)   Pancytopenia (HCC)   Hypokalemia  Hospital Course: As per H&P written by Dr. Clearence Ped On 11/29/2021 Robin [name] is a 81 y.o. female with medical history significant of breast cancer, coronary artery disease, cardiomyopathy, essential hypertension, hepatic steatosis, hyperlipidemia, hypothyroidism, stroke with residual left-sided weakness, type 2 diabetes mellitus, and more presents to the ED with a chief complaint of left arm swelling.  Daughter works in [employer] and is at bedside.  Patient request that we take most of the history from daughter.  Daughter reports that the swelling started about 2 weeks ago.  She was at the cancer center, so Dr. Raliegh Ip had a look at it and got an ultrasound.  Approximately 4 days ago, they were given the results of the ultrasound which was negative for DVT.  Dr. Raliegh Ip told him to get a compression sleeve, but she started to get worse.  Last night she had a fever and her arm became erythematous.  Erythema has been spreading in both directions from her elbow, all the way up to her chest, and down to her hand.  Her  Tmax was 101.4.  She was given Tylenol which did control the fever.  Despite having had double mastectomy, patient has never had swelling in her arms like this before.  She reports that the arm is not painful.  The chest is not painful.  She does feel more fatigued and malaise than normal, but attributes it to the new chemo medication.  Last night she had 4 episodes of nonbloody emesis.  Few days ago she had 1 soft BM, that was softer than her BMs since she has been on chemo.  Patient reports that she does have asymmetric weakness but it is chronic since 2001 with her left upper extremity and lower extremity being affected.  Patient has no other complaints at this time.   Patient does not smoke, does not drink, does not use illicit drugs.  She is vaccinated for COVID.  Patient is full code.    Assessment and Plan: * Cellulitis -Left arm edema and erythema appreciated at time of admission; improving currently. - Maintain limb elevated -Keep area clean and dry -Continue IV antibiotics -MRSA swab negative. -Left upper extremity ultrasound prior to admission reveal no presence of DVT. - Continue supportive care and follow clinical response.   Bacteremia due to Streptococcus -Positive blood cultures for Streptococcus species -Final speciation and sensitivity pending -Antibiotics has been tailored and adjusted based on culture results -2D echo not demonstrating vegetations -Following ID service recommendations (Dr. Baxter Flattery) blood cultures repeated, so far negative and without growth for > 36 hours prior to discharge -patient will complete 12 more days of cefadroxil ('500mg'$   BID) as per ID rec's.   Hypokalemia - Repleted and stable -Continue to follow electrolytes trend.   Pancytopenia ([facility]) -continue outpatient follow up with Dr. Raliegh Ip - Most likely related to chemo - White blood cell count 1.7, hemoglobin 9.3 (after receiving 2 units PRBCs for hemoglobin of 6.6 throughout this admission).    -Platelet count in the 90,000 range.   -Repeat CBC in AM.   History of DVT (deep vein thrombosis) - Anticoagulation has been stopped in the setting of worsening anemia -Patient is status posttransfusion with adequate response. -If things remains stable will resume Eliquis on 12/03/2021. -aspirin discotninued.   Hypocalcemia - IV calcium gluconate given at time of admission -Continue close outpatient follow-up of calcium levels.   Generalized weakness - Most likely related to chemo treatments and now cellulitis as well - Continue treatment of cellulitis - Physical therapy has seen patient and has recommended home health PT at time of discharge. -Family able to assist patient.   Yeast dermatitis -nystatin powder and diflucan prescribed.   Gastroesophageal reflux disease -Continue Protonix   Hypothyroidism - Continue Synthroid - TSH borderline elevated; could be in the setting of sick thyroid syndrome -Will recommend repeat thyroid panel as an outpatient after acute infection process resolved.   Essential hypertension, benign - Continue Norvasc, Coreg - Continue to monitor -Blood pressure stable. -Heart healthy diet discussed with patient.   Coronary atherosclerosis of native coronary artery -Continue beta-blocker, statin, Imdur, aspirin -No chest pain reported. -Telemetry demonstrated no incidents or ischemic changes.  Class I obesity -Portion control and low calorie diet discussed with patient. -Body mass index is 32.44 kg/m.  Anemia of chronic disease/associated with chemotherapy -Units of PRBCs transfused during hospitalization -Hemoglobin remained stable -No signs of acute/overt bleeding appreciated -Aspirin discontinued -Per oncology service okay to resume chronic Eliquis at discharge.   Consultants: Infectious disease and oncology Procedures performed: See below for x-ray report Disposition: Home with Home health Diet recommendation: Heart healthy/low  calorie diet.  DISCHARGE MEDICATION: Allergies as of 12/03/2021       Reactions   Codeine Nausea And Vomiting        Medication List     STOP taking these medications    ALPRAZolam 0.25 MG tablet Commonly known as: XANAX   aspirin 81 MG tablet       TAKE these medications    acetaminophen 325 MG tablet Commonly known as: TYLENOL Take 2 tablets (650 mg total) by mouth every 6 (six) hours as needed for mild pain, fever or headache (or Fever >/= 101). What changed: how much to take   amLODipine 10 MG tablet Commonly known as: NORVASC Take 1 tablet (10 mg total) by mouth daily.   anastrozole 1 MG tablet Commonly known as: ARIMIDEX TAKE 1 TABLET BY MOUTH EVERY DAY   carvedilol 12.5 MG tablet Commonly known as: COREG TAKE 1 TABLET (12.'5MG'$  TOTAL) BY MOUTH TWICE A DAY WITH MEALS What changed: See the new instructions.   cefadroxil 500 MG capsule Commonly known as: DURICEF Take 1 capsule (500 mg total) by mouth 2 (two) times daily for 12 days.   cetirizine 10 MG tablet Commonly known as: ZYRTEC Take 1 tablet (10 mg total) by mouth daily.   dicyclomine 10 MG capsule Commonly known as: BENTYL Take 1 capsule (10 mg total) by mouth 3 (three) times daily as needed for spasms.   Eliquis 5 MG Tabs tablet Generic drug: apixaban TAKE 1 TABLET BY MOUTH TWICE A DAY   ferrous sulfate  325 (65 FE) MG tablet Take 1 tablet (325 mg total) by mouth daily with breakfast.   fluconazole 100 MG tablet Commonly known as: Diflucan Take 1 tablet (100 mg total) by mouth daily for 3 days. To use on daily basis for 3 days if yeast infection developed.   fluticasone 50 MCG/ACT nasal spray Commonly known as: FLONASE Place 2 sprays into both nostrils daily.   furosemide 20 MG tablet Commonly known as: LASIX TAKE 1 TABLET BY MOUTH EVERY DAY AS NEEDED AS DIRECTED What changed: See the new instructions.   gabapentin 100 MG capsule Commonly known as: NEURONTIN TAKE 1 CAPSULE BY  MOUTH AT BEDTIME.   isosorbide mononitrate 30 MG 24 hr tablet Commonly known as: IMDUR Take 1 tablet (30 mg total) by mouth daily.   Klor-Con M10 10 MEQ tablet Generic drug: potassium chloride TAKE 1 TABLET BY MOUTH EVERY DAY   levothyroxine 125 MCG tablet Commonly known as: SYNTHROID TAKE 1 TABLET BY MOUTH DAILY BEFORE BREAKFAST.   Misc. Devices Misc Please provide with left upper extremity lymphedema sleeve   nitroGLYCERIN 0.4 MG SL tablet Commonly known as: NITROSTAT Place 1 tablet (0.4 mg total) under the tongue every 5 (five) minutes x 3 doses as needed.   Omega-3 Fatty Acids 300 MG Caps Take 1 capsule (300 mg total) by mouth 2 (two) times daily. What changed: when to take this   palbociclib 75 MG tablet Commonly known as: IBRANCE Take 1 tablet (75 mg total) by mouth daily. Take for 21 days on, 7 days off, repeat every 28 days. Continue to hold until follow up with oncology service. What changed: additional instructions   pantoprazole 40 MG tablet Commonly known as: PROTONIX Take 1 tablet (40 mg total) by mouth daily.   prochlorperazine 10 MG tablet Commonly known as: COMPAZINE Take 1 tablet (10 mg total) by mouth every 6 (six) hours as needed for nausea or vomiting.   rosuvastatin 40 MG tablet Commonly known as: CRESTOR TAKE 1 TABLET BY MOUTH EVERY DAY   traMADol 50 MG tablet Commonly known as: ULTRAM Take 1 tablet (50 mg total) by mouth every 8 (eight) hours as needed.   Vitamin D3 125 MCG (5000 UT) Caps TAKE 1 CAPSULE BY MOUTH ONCE DAILY        Follow-up Information     [name], MD. Schedule an appointment as soon as possible for a visit in 10 day(s).   Specialty: Internal Medicine Contact information: [address] [place] [address] [phone]                Discharge Exam: Danley Danker Weights   11/28/21 1722  Weight: 91.2 kg   General exam: Alert, awake, oriented x 3; no CP, no SOB, no nausea or vomiting. Respiratory  system: Clear to auscultation. Respiratory effort normal. Good sat on RA. Cardiovascular system:RRR. No rubs or gallops Gastrointestinal system: Abdomen is obese, nondistended, soft and nontender. No organomegaly or masses felt. Normal bowel sounds heard. Central nervous system: Alert and oriented. No focal neurological deficits. Extremities: No cyanosis or clubbing; almost complete resolution of the left upper extremity cellulitic process appreciated. Skin: No petechiae. Psychiatry: Judgement and insight appear normal. Mood & affect appropriate.   Condition at discharge: stable and improved.  The results of significant diagnostics from this hospitalization (including imaging, microbiology, ancillary and laboratory) are listed below for reference.   Imaging Studies: ECHOCARDIOGRAM COMPLETE  Result Date: 12/01/2021    ECHOCARDIOGRAM REPORT   Patient Name:  Robin [name] Date of Exam: 12/01/2021 Medical Rec #:  [id number]        Height:       66.0 in Accession #:    [id number]       Weight:       201.0 lb Date of Birth:  03/08/1940        BSA:          2.004 m Patient Age:    29 years         BP:           162/52 mmHg Patient Gender: F                HR:           53 bpm. Exam Location:  [facility] Procedure: 2D Echo, Cardiac Doppler and Color Doppler Indications:    Bacteremia  History:        Patient has prior history of Echocardiogram examinations, most                 recent 05/02/2020. Cardiomyopathy, CAD and Previous Myocardial                 Infarction, Stroke; Risk Factors:Hypertension, Dyslipidemia and                 Diabetes.  Sonographer:    Bernadene Person RDCS Referring Phys: [place]  1. Left ventricular ejection fraction, by estimation, is 55 to 60%. Left ventricular ejection fraction by 2D MOD biplane is 56.0 %. The left ventricle has normal function. The left ventricle has no regional wall motion abnormalities. Left ventricular diastolic parameters are  consistent with Grade I diastolic dysfunction (impaired relaxation). Elevated left ventricular end-diastolic pressure. The E/e' is 29.  2. Right ventricular systolic function is normal. The right ventricular size is normal. There is mildly elevated pulmonary artery systolic pressure. The estimated right ventricular systolic pressure is 34.1 mmHg.  3. The mitral valve is abnormal. Trivial mitral valve regurgitation.  4. The aortic valve is tricuspid. Aortic valve regurgitation is not visualized. No aortic stenosis is present.  5. The inferior vena cava is dilated in size with >50% respiratory variability, suggesting right atrial pressure of 8 mmHg. Comparison(s): Changes from prior study are noted. 05/02/2020: LVEF 60-65%. Conclusion(s)/Recommendation(s): No evidence of valvular vegetations on this transthoracic echocardiogram. Consider a transesophageal echocardiogram to exclude infective endocarditis if clinically indicated. FINDINGS  Left Ventricle: Left ventricular ejection fraction, by estimation, is 55 to 60%. Left ventricular ejection fraction by 2D MOD biplane is 56.0 %. The left ventricle has normal function. The left ventricle has no regional wall motion abnormalities. The left ventricular internal cavity size was normal in size. There is no left ventricular hypertrophy. Left ventricular diastolic parameters are consistent with Grade I diastolic dysfunction (impaired relaxation). Elevated left ventricular end-diastolic pressure. The E/e' is 20. Right Ventricle: The right ventricular size is normal. No increase in right ventricular wall thickness. Right ventricular systolic function is normal. There is mildly elevated pulmonary artery systolic pressure. The tricuspid regurgitant velocity is 2.81  m/s, and with an assumed right atrial pressure of 8 mmHg, the estimated right ventricular systolic pressure is 93.7 mmHg. Left Atrium: Left atrial size was normal in size. Right Atrium: Right atrial size was normal  in size. Pericardium: There is no evidence of pericardial effusion. Presence of epicardial fat layer. Mitral Valve: The mitral valve is abnormal. Mild to moderate mitral annular calcification. Trivial mitral valve regurgitation. Tricuspid  Valve: The tricuspid valve is grossly normal. Tricuspid valve regurgitation is mild. Aortic Valve: The aortic valve is tricuspid. Aortic valve regurgitation is not visualized. No aortic stenosis is present. Pulmonic Valve: The pulmonic valve was grossly normal. Pulmonic valve regurgitation is not visualized. Aorta: The aortic root and ascending aorta are structurally normal, with no evidence of dilitation. Venous: The inferior vena cava is dilated in size with greater than 50% respiratory variability, suggesting right atrial pressure of 8 mmHg. IAS/Shunts: No atrial level shunt detected by color flow Doppler.  LEFT VENTRICLE PLAX 2D                        Biplane EF (MOD) LVIDd:         5.00 cm         LV Biplane EF:   Left LVIDs:         3.00 cm                          ventricular LV PW:         1.20 cm                          ejection LV IVS:        0.90 cm                          fraction by LVOT diam:     2.10 cm                          2D MOD LV SV:         88                               biplane is LV SV Index:   44                               56.0 %. LVOT Area:     3.46 cm                                Diastology                                LV e' medial:    5.93 cm/s LV Volumes (MOD)               LV E/e' medial:  17.9 LV vol d, MOD    74.5 ml       LV e' lateral:   5.37 cm/s A2C:                           LV E/e' lateral: 19.7 LV vol d, MOD    78.0 ml A4C: LV vol s, MOD    32.2 ml A2C: LV vol s, MOD    33.8 ml A4C: LV SV MOD A2C:   42.3 ml LV SV MOD A4C:   78.0 ml LV SV MOD BP:    43.5 ml RIGHT VENTRICLE RV S prime:     12.20 cm/s TAPSE (M-mode): 2.1 cm  LEFT ATRIUM             Index        RIGHT ATRIUM           Index LA diam:        4.40 cm 2.20 cm/m   RA  Area:     20.20 cm LA Vol (A2C):   54.1 ml 27.00 ml/m  RA Volume:   61.10 ml  30.49 ml/m LA Vol (A4C):   49.8 ml 24.85 ml/m LA Biplane Vol: 54.4 ml 27.15 ml/m  AORTIC VALVE LVOT Vmax:   97.30 cm/s LVOT Vmean:  63.800 cm/s LVOT VTI:    0.254 m  AORTA Ao Root diam: 3.20 cm Ao Asc diam:  3.10 cm MITRAL VALVE                TRICUSPID VALVE MV Area (PHT): 3.20 cm     TR Peak grad:   31.6 mmHg MV Decel Time: 237 msec     TR Vmax:        281.00 cm/s MV E velocity: 106.00 cm/s MV A velocity: 101.00 cm/s  SHUNTS MV E/A ratio:  1.05         Systemic VTI:  0.25 m                             Systemic Diam: 2.10 cm Lyman Bishop MD Electronically signed by Lyman Bishop MD Signature Date/Time: 12/01/2021/3:34:13 PM    Final    US Venous Img Upper Uni Left  Result Date: 11/14/2021 CLINICAL DATA:  Left upper extremity edema. EXAM: LEFT UPPER EXTREMITY VENOUS DOPPLER ULTRASOUND TECHNIQUE: Gray-scale sonography with graded compression, as well as color Doppler and duplex ultrasound were performed to evaluate the upper extremity deep venous system from the level of the subclavian vein and including the jugular, axillary, basilic, radial, ulnar and upper cephalic vein. Spectral Doppler was utilized to evaluate flow at rest and with distal augmentation maneuvers. COMPARISON:  None Available. FINDINGS: Contralateral Subclavian Vein: Respiratory phasicity is normal and symmetric with the symptomatic side. No evidence of thrombus. Normal compressibility. Internal Jugular Vein: No evidence of thrombus. Normal compressibility, respiratory phasicity and response to augmentation. Subclavian Vein: No evidence of thrombus. Normal compressibility, respiratory phasicity and response to augmentation. Axillary Vein: No evidence of thrombus. Normal compressibility, respiratory phasicity and response to augmentation. Cephalic Vein: No evidence of thrombus. Normal compressibility, respiratory phasicity and response to augmentation. Basilic  Vein: No evidence of thrombus. Normal compressibility, respiratory phasicity and response to augmentation. Brachial Veins: No evidence of thrombus. Normal compressibility, respiratory phasicity and response to augmentation. Radial Veins: No evidence of thrombus. Normal compressibility, respiratory phasicity and response to augmentation. Ulnar Veins: No evidence of thrombus. Normal compressibility, respiratory phasicity and response to augmentation. Venous Reflux:  None visualized. Other Findings: No evidence of superficial thrombophlebitis or abnormal fluid collection. IMPRESSION: No evidence of DVT within the left upper extremity. Electronically Signed   By: Aletta Edouard M.D.   On: 11/14/2021 17:10    Microbiology: Results for orders placed or performed during the hospital encounter of 11/28/21  Blood Culture (routine x 2)     Status: Abnormal   Collection Time: 11/28/21  6:23 PM   Specimen: BLOOD RIGHT ARM  Result Value Ref Range Status   Specimen Description   Final    BLOOD RIGHT ARM Performed at [facility], [address], [place], [place] [address]  Special Requests   Final    BOTTLES DRAWN AEROBIC AND ANAEROBIC Blood Culture adequate volume Performed at [facility], [address] [place], [place] [address]    Culture  Setup Time   Final    GRAM POSITIVE COCCI AEROBIC BOTTLE ONLY Gram Stain Report Called to,Read Back By and Verified With: ASHLEY SHELTON,RN '@[id number]'$  11/29/21 BY SSLANE CRITICAL RESULT CALLED TO, READ BACK BY AND VERIFIED WITH: RN N.HEARP [id number] '@[id number]'$  FH Performed at [facility], [place] [address], [place], [place] [address]    Culture STREPTOCOCCUS MITIS/ORALIS (A)  Final   Report Status 12/03/2021 FINAL  Final   Organism ID, Bacteria STREPTOCOCCUS MITIS/ORALIS  Final      Susceptibility   Streptococcus mitis/oralis - MIC*    TETRACYCLINE 0.5 SENSITIVE Sensitive     VANCOMYCIN 0.5 SENSITIVE Sensitive     CLINDAMYCIN <=0.25 SENSITIVE Sensitive     *  STREPTOCOCCUS MITIS/ORALIS  Blood Culture ID Panel (Reflexed)     Status: Abnormal   Collection Time: 11/28/21  6:23 PM  Result Value Ref Range Status   Enterococcus faecalis NOT DETECTED NOT DETECTED Final   Enterococcus Faecium NOT DETECTED NOT DETECTED Final   Listeria monocytogenes NOT DETECTED NOT DETECTED Final   Staphylococcus species NOT DETECTED NOT DETECTED Final   Staphylococcus aureus (BCID) NOT DETECTED NOT DETECTED Final   Staphylococcus epidermidis NOT DETECTED NOT DETECTED Final   Staphylococcus lugdunensis NOT DETECTED NOT DETECTED Final   Streptococcus species DETECTED (A) NOT DETECTED Final    Comment: Not Enterococcus species, Streptococcus agalactiae, Streptococcus pyogenes, or Streptococcus pneumoniae. CRITICAL RESULT CALLED TO, READ BACK BY AND VERIFIED WITH: RN N.HEARP [id number] '@[id number]'$  FH    Streptococcus agalactiae NOT DETECTED NOT DETECTED Final   Streptococcus pneumoniae NOT DETECTED NOT DETECTED Final   Streptococcus pyogenes NOT DETECTED NOT DETECTED Final   A.calcoaceticus-baumannii NOT DETECTED NOT DETECTED Final   Bacteroides fragilis NOT DETECTED NOT DETECTED Final   Enterobacterales NOT DETECTED NOT DETECTED Final   Enterobacter cloacae complex NOT DETECTED NOT DETECTED Final   Escherichia coli NOT DETECTED NOT DETECTED Final   Klebsiella aerogenes NOT DETECTED NOT DETECTED Final   Klebsiella oxytoca NOT DETECTED NOT DETECTED Final   Klebsiella pneumoniae NOT DETECTED NOT DETECTED Final   Proteus species NOT DETECTED NOT DETECTED Final   Salmonella species NOT DETECTED NOT DETECTED Final   Serratia marcescens NOT DETECTED NOT DETECTED Final   Haemophilus influenzae NOT DETECTED NOT DETECTED Final   Neisseria meningitidis NOT DETECTED NOT DETECTED Final   Pseudomonas aeruginosa NOT DETECTED NOT DETECTED Final   Stenotrophomonas maltophilia NOT DETECTED NOT DETECTED Final   Candida albicans NOT DETECTED NOT DETECTED Final   Candida auris NOT DETECTED NOT  DETECTED Final   Candida glabrata NOT DETECTED NOT DETECTED Final   Candida krusei NOT DETECTED NOT DETECTED Final   Candida parapsilosis NOT DETECTED NOT DETECTED Final   Candida tropicalis NOT DETECTED NOT DETECTED Final   Cryptococcus neoformans/gattii NOT DETECTED NOT DETECTED Final    Comment: Performed at [facility], [address] [place],  [address]  MRSA Next Gen by PCR, Nasal     Status: None   Collection Time: 11/29/21  1:30 AM   Specimen: Nasal Mucosa; Nasal Swab  Result Value Ref Range Status   MRSA by PCR Next Gen NOT DETECTED NOT DETECTED Final    Comment: (NOTE) The GeneXpert MRSA Assay (FDA approved for NASAL specimens only), is one component of a comprehensive MRSA colonization  surveillance program. It is not intended to diagnose MRSA infection nor to guide or monitor treatment for MRSA infections. Test performance is not FDA approved in patients less than 23 years old. Performed at [facility], [address] [place], [place] [address]   Culture, blood (Routine X 2) w Reflex to ID Panel     Status: None (Preliminary result)   Collection Time: 12/01/21  4:57 PM   Specimen: BLOOD RIGHT HAND  Result Value Ref Range Status   Specimen Description BLOOD RIGHT HAND  Final   Special Requests   Final    BOTTLES DRAWN AEROBIC AND ANAEROBIC Blood Culture adequate volume   Culture   Final    NO GROWTH 2 DAYS Performed at [facility], [address], [place], [place] [address]    Report Status PENDING  Incomplete  Culture, blood (Routine X 2) w Reflex to ID Panel     Status: None (Preliminary result)   Collection Time: 12/01/21  5:05 PM   Specimen: BLOOD RIGHT FOREARM  Result Value Ref Range Status   Specimen Description BLOOD RIGHT FOREARM  Final   Special Requests   Final    BOTTLES DRAWN AEROBIC AND ANAEROBIC Blood Culture adequate volume   Culture   Final    NO GROWTH 2 DAYS Performed at [facility], [address] [place], [place] [address]     Report Status PENDING  Incomplete    Labs: CBC: Recent Labs  Lab 11/28/21 1823 11/29/21 0243 11/30/21 0914 12/01/21 0510  WBC 2.4* 1.9* 1.7* 1.5*  NEUTROABS 2.0 1.5*  --   --   HGB 7.0* 6.6* 9.2* 8.9*  HCT 22.5* 21.2* 28.7* 28.8*  MCV 93.0 93.4 90.3 91.4  PLT 96* 89* 91* 98*   Basic Metabolic Panel: Recent Labs  Lab 11/28/21 1823 11/29/21 0243 12/01/21 0510  NA 136 137 136  K 3.0* 3.3* 3.4*  CL 108 111 112*  CO2 19* 19* 20*  GLUCOSE 108* 99 103*  BUN '15 12 10  '$ CREATININE 1.19* 0.96 0.90  CALCIUM 8.5* 8.4* 7.9*  MG 2.0 2.0  --    Liver Function Tests: Recent Labs  Lab 11/28/21 1823 11/29/21 0243  AST 25 23  ALT 16 16  ALKPHOS 160* 146*  BILITOT 0.8 0.6  PROT 7.0 6.3*  ALBUMIN 3.7 3.2*   CBG: No results for input(s): "GLUCAP" in the last 168 hours.  Discharge time spent: greater than 30 minutes.  Signed: Barton Dubois, MD [facility] 12/03/2021

## 2021-12-05 ENCOUNTER — Inpatient Hospital Stay: Payer: Medicare PPO

## 2021-12-06 LAB — CULTURE, BLOOD (ROUTINE X 2)
Culture: NO GROWTH
Culture: NO GROWTH
Special Requests: ADEQUATE
Special Requests: ADEQUATE

## 2021-12-11 ENCOUNTER — Telehealth: Payer: Self-pay | Admitting: *Deleted

## 2021-12-11 ENCOUNTER — Inpatient Hospital Stay: Payer: Medicare PPO

## 2021-12-11 NOTE — Patient Outreach (Signed)
  Care Coordination TOC Note Transition Care Management Follow-up Telephone Call Date of discharge and from where: [facility] on 12/03/21 How have you been since you were released from the hospital? "I've been doing fine" Any questions or concerns? No  Items Reviewed: Did the pt receive and understand the discharge instructions provided? Yes  Medications obtained and verified? Yes  Other? No  Any new allergies since your discharge? No  Dietary orders reviewed? Yes Do you have support at home? Yes   Home Care and Equipment/Supplies: Were home health services ordered? yes If so, what is the name of the agency? unsure  Has the agency set up a time to come to the patient's home? Patient declined Were any new equipment or medical supplies ordered?  No What is the name of the medical supply agency?  Were you able to get the supplies/equipment? not applicable Do you have any questions related to the use of the equipment or supplies? No  Functional Questionnaire: (I = Independent and D = Dependent) ADLs: I  Bathing/Dressing- I  Meal Prep- I  Eating- I  Maintaining continence- I  Transferring/Ambulation- I  Managing Meds- I  Follow up appointments reviewed:  PCP Hospital f/u appt confirmed? No   Specialist Hospital f/u appt confirmed? Yes  Scheduled to see Dr Delton Coombes on 12/26/21 @ 11:00. Are transportation arrangements needed? No  If their condition worsens, is the pt aware to call PCP or go to the Emergency Dept.? Yes Was the patient provided with contact information for the PCP's office or ED? Yes Was to pt encouraged to call back with questions or concerns? Yes  SDOH assessments and interventions completed:   Yes  Care Coordination Interventions Activated:  Yes   Care Coordination Interventions:  PCP follow up appointment requested   Encounter Outcome:  Pt. Visit Completed    Chong Sicilian, BSN, RN-BC RN Care Coordinator [place]: [phone] Main #: [phone]

## 2021-12-11 NOTE — Progress Notes (Signed)
  Care Coordination  Note  12/11/2021 Name: Robin [name] MRN: [id number] DOB: 05-15-1940  Robin [name] is a 81 y.o. year old primary care patient of [name], MD. I reached out to [name] by phone today to assist with scheduling a follow up appointment. [name] verbally consented to my assistance.       Follow up plan: Telephone appointment with care coordination team member scheduled for: 12/17/21 at 1:40 PM.  [place]  Direct Dial: [phone]

## 2021-12-17 ENCOUNTER — Ambulatory Visit (INDEPENDENT_AMBULATORY_CARE_PROVIDER_SITE_OTHER): Payer: Medicare PPO | Admitting: Internal Medicine

## 2021-12-17 ENCOUNTER — Encounter: Payer: Self-pay | Admitting: Internal Medicine

## 2021-12-17 VITALS — BP 158/65 | HR 64 | Resp 16 | Ht 66.0 in | Wt 205.0 lb

## 2021-12-17 DIAGNOSIS — L304 Erythema intertrigo: Secondary | ICD-10-CM | POA: Diagnosis not present

## 2021-12-17 MED ORDER — TERBINAFINE HCL 1 % EX CREA: 1.0000 | TOPICAL_CREAM | Freq: Two times a day (BID) | CUTANEOUS | 0 refills | Status: DC

## 2021-12-17 NOTE — Progress Notes (Signed)
     CC: Transitions Of Care (D/c 12/03/21 from [facility] )    HPI:Robin [name] is a 81 y.o. female who presents for evaluation of left arm cellulits. She finished the antibiotic today. Her arm has improved. Continues to have a rash in fold of left arm. She has follow up with oncologist tomorrow for treatment of anemia and labs are usually drawn at these visits. She has no symptoms of hypothyroidism. Patient prefers not having TSH rechecked today for elevated TSH in setting of recent illness. Discussed and decided to put repeat labs off for now.  For the details of today's visit, please refer to the assessment and plan.   Past Medical History:  Diagnosis Date   Breast cancer ([place])    Tamoxifen started 2007   CAD (coronary artery disease)    DES second diagonal 2/12   Cardiomyopathy    Probable Takotsubo, LVEF 30-35% 2/12   Carotid artery disease ([place])    Essential hypertension    Hepatic steatosis    Hyperlipidemia    Hypothyroidism    NSTEMI (non-ST elevated myocardial infarction) ([place])    2/12   Pancreas divisum    Stroke (Robin [name])    (2000) residual left-sided weakness   Type 2 diabetes mellitus ([place])    reports not currently diabetic     Physical Exam: Vitals:   12/17/21 1330  BP: (!) 158/65  Pulse: 64  Resp: 16  SpO2: 92%  Weight: 205 lb (93 kg)  Height: '5\' 6"'$  (1.676 m)     Physical Exam Constitutional:      General: She is not in acute distress.    Appearance: She is normal weight. She is not ill-appearing.  Eyes:     General: No scleral icterus.    Conjunctiva/sclera: Conjunctivae normal.  Cardiovascular:     Rate and Rhythm: Normal rate and regular rhythm.  Pulmonary:     Effort: Pulmonary effort is normal.  Skin:    Comments: Fold of arm at elbow has purplish patch. 1cm break in skin at fold without drainage.          Assessment & Plan:   Intertrigo Intertrigo at left elbow.  Cellulitis has resolved  Instructions given on cleansing  area daily and keeping area dry. Recommended microporous cellulose power if need to keep area dry. Apply Lamisil x 2 daily for up to 4 weeks.     Lorene Dy, MD

## 2021-12-17 NOTE — Patient Instructions (Addendum)
   Rash in crease of arm ( Intertrigo)   - Daily cleansing of intertriginous skin with a mild cleanser followed by drying of affected area with a hair dryer on a cool setting  - Aeration of affected area when feasible  - Daily application of drying powders, such as powders composed of microporous cellulose  - terbinafine (LAMISIL) 1 % cream; Apply 1 Application topically 2 (two) times daily.  Dispense: 42 g; Refill: 0

## 2021-12-17 NOTE — Assessment & Plan Note (Signed)
Intertrigo at left elbow.  Cellulitis has resolved  Instructions given on cleansing area daily and keeping area dry. Recommended microporous cellulose power if need to keep area dry. Apply Lamisil x 2 daily for up to 4 weeks.

## 2021-12-18 ENCOUNTER — Inpatient Hospital Stay: Payer: Medicare PPO

## 2021-12-18 DIAGNOSIS — Z7982 Long term (current) use of aspirin: Secondary | ICD-10-CM | POA: Diagnosis not present

## 2021-12-18 DIAGNOSIS — M7989 Other specified soft tissue disorders: Secondary | ICD-10-CM | POA: Diagnosis not present

## 2021-12-18 DIAGNOSIS — Z86718 Personal history of other venous thrombosis and embolism: Secondary | ICD-10-CM | POA: Diagnosis not present

## 2021-12-18 DIAGNOSIS — C7951 Secondary malignant neoplasm of bone: Secondary | ICD-10-CM | POA: Diagnosis not present

## 2021-12-18 DIAGNOSIS — D508 Other iron deficiency anemias: Secondary | ICD-10-CM

## 2021-12-18 DIAGNOSIS — Z7901 Long term (current) use of anticoagulants: Secondary | ICD-10-CM | POA: Diagnosis not present

## 2021-12-18 DIAGNOSIS — Z9013 Acquired absence of bilateral breasts and nipples: Secondary | ICD-10-CM | POA: Diagnosis not present

## 2021-12-18 DIAGNOSIS — Z79811 Long term (current) use of aromatase inhibitors: Secondary | ICD-10-CM | POA: Diagnosis not present

## 2021-12-18 DIAGNOSIS — Z801 Family history of malignant neoplasm of trachea, bronchus and lung: Secondary | ICD-10-CM | POA: Diagnosis not present

## 2021-12-18 DIAGNOSIS — Z79890 Hormone replacement therapy (postmenopausal): Secondary | ICD-10-CM | POA: Diagnosis not present

## 2021-12-18 DIAGNOSIS — Z79899 Other long term (current) drug therapy: Secondary | ICD-10-CM | POA: Diagnosis not present

## 2021-12-18 DIAGNOSIS — N2889 Other specified disorders of kidney and ureter: Secondary | ICD-10-CM | POA: Diagnosis not present

## 2021-12-18 DIAGNOSIS — C50912 Malignant neoplasm of unspecified site of left female breast: Secondary | ICD-10-CM | POA: Diagnosis not present

## 2021-12-18 DIAGNOSIS — Z170 Estrogen receptor positive status [ER+]: Secondary | ICD-10-CM | POA: Diagnosis not present

## 2021-12-18 DIAGNOSIS — D649 Anemia, unspecified: Secondary | ICD-10-CM | POA: Diagnosis not present

## 2021-12-18 LAB — CBC
HCT: 34.2 % — ABNORMAL LOW (ref 36.0–46.0)
Hemoglobin: 10.7 g/dL — ABNORMAL LOW (ref 12.0–15.0)
MCH: 28.2 pg (ref 26.0–34.0)
MCHC: 31.3 g/dL (ref 30.0–36.0)
MCV: 90.2 fL (ref 80.0–100.0)
Platelets: 215 10*3/uL (ref 150–400)
RBC: 3.79 MIL/uL — ABNORMAL LOW (ref 3.87–5.11)
RDW: 24.4 % — ABNORMAL HIGH (ref 11.5–15.5)
WBC: 2.4 10*3/uL — ABNORMAL LOW (ref 4.0–10.5)
nRBC: 0 % (ref 0.0–0.2)

## 2021-12-18 NOTE — Progress Notes (Signed)
Patient is her today for her retacrit, she is on [place].   Patient is taking Ibrance as prescribed.  She has not missed any doses and reports no side effects at this time.    No retacrit needed today per protocol.    Follow up as scheduled.

## 2021-12-19 LAB — CANCER ANTIGEN 27.29: CA 27.29: 166.5 U/mL — ABNORMAL HIGH (ref 0.0–38.6)

## 2021-12-20 LAB — CANCER ANTIGEN 15-3: CA 15-3: 111 U/mL — ABNORMAL HIGH (ref 0.0–25.0)

## 2021-12-23 ENCOUNTER — Other Ambulatory Visit: Payer: Self-pay

## 2021-12-23 ENCOUNTER — Other Ambulatory Visit (HOSPITAL_COMMUNITY): Payer: Self-pay | Admitting: Hematology

## 2021-12-23 ENCOUNTER — Telehealth: Payer: Self-pay | Admitting: Internal Medicine

## 2021-12-23 ENCOUNTER — Other Ambulatory Visit: Payer: Self-pay | Admitting: *Deleted

## 2021-12-23 DIAGNOSIS — L304 Erythema intertrigo: Secondary | ICD-10-CM

## 2021-12-23 DIAGNOSIS — E039 Hypothyroidism, unspecified: Secondary | ICD-10-CM

## 2021-12-23 MED ORDER — TERBINAFINE HCL 1 % EX CREA: 1.0000 | TOPICAL_CREAM | Freq: Two times a day (BID) | CUTANEOUS | 0 refills | Status: DC

## 2021-12-23 NOTE — Telephone Encounter (Signed)
Anastrozole refill approved.  Patient tolerating and is to continue therapy. 

## 2021-12-23 NOTE — Telephone Encounter (Signed)
Rx sent 

## 2021-12-23 NOTE — Telephone Encounter (Signed)
Pharm did not receive  terbinafine (LAMISIL) 1 % cream   Patient would like resent to pharm

## 2021-12-26 ENCOUNTER — Inpatient Hospital Stay (HOSPITAL_BASED_OUTPATIENT_CLINIC_OR_DEPARTMENT_OTHER): Payer: Medicare PPO | Admitting: Hematology

## 2021-12-26 ENCOUNTER — Inpatient Hospital Stay: Payer: Medicare PPO | Attending: Hematology

## 2021-12-26 ENCOUNTER — Inpatient Hospital Stay: Payer: Medicare PPO

## 2021-12-26 ENCOUNTER — Telehealth: Payer: Self-pay | Admitting: Internal Medicine

## 2021-12-26 ENCOUNTER — Other Ambulatory Visit: Payer: Self-pay

## 2021-12-26 VITALS — BP 138/76 | HR 58 | Temp 98.7°F | Resp 17 | Ht 66.0 in | Wt 198.2 lb

## 2021-12-26 DIAGNOSIS — Z7901 Long term (current) use of anticoagulants: Secondary | ICD-10-CM | POA: Diagnosis not present

## 2021-12-26 DIAGNOSIS — Z79811 Long term (current) use of aromatase inhibitors: Secondary | ICD-10-CM | POA: Diagnosis not present

## 2021-12-26 DIAGNOSIS — C7951 Secondary malignant neoplasm of bone: Secondary | ICD-10-CM | POA: Insufficient documentation

## 2021-12-26 DIAGNOSIS — L304 Erythema intertrigo: Secondary | ICD-10-CM

## 2021-12-26 DIAGNOSIS — C50912 Malignant neoplasm of unspecified site of left female breast: Secondary | ICD-10-CM | POA: Diagnosis not present

## 2021-12-26 DIAGNOSIS — Z79890 Hormone replacement therapy (postmenopausal): Secondary | ICD-10-CM | POA: Diagnosis not present

## 2021-12-26 DIAGNOSIS — D649 Anemia, unspecified: Secondary | ICD-10-CM | POA: Diagnosis not present

## 2021-12-26 DIAGNOSIS — Z79899 Other long term (current) drug therapy: Secondary | ICD-10-CM | POA: Insufficient documentation

## 2021-12-26 DIAGNOSIS — Z170 Estrogen receptor positive status [ER+]: Secondary | ICD-10-CM | POA: Insufficient documentation

## 2021-12-26 DIAGNOSIS — Z86718 Personal history of other venous thrombosis and embolism: Secondary | ICD-10-CM | POA: Diagnosis not present

## 2021-12-26 DIAGNOSIS — N2889 Other specified disorders of kidney and ureter: Secondary | ICD-10-CM | POA: Insufficient documentation

## 2021-12-26 DIAGNOSIS — R110 Nausea: Secondary | ICD-10-CM

## 2021-12-26 LAB — CBC WITH DIFFERENTIAL/PLATELET
Abs Immature Granulocytes: 0.01 10*3/uL (ref 0.00–0.07)
Basophils Absolute: 0 10*3/uL (ref 0.0–0.1)
Basophils Relative: 1 %
Eosinophils Absolute: 0 10*3/uL (ref 0.0–0.5)
Eosinophils Relative: 2 %
HCT: 32.1 % — ABNORMAL LOW (ref 36.0–46.0)
Hemoglobin: 10.4 g/dL — ABNORMAL LOW (ref 12.0–15.0)
Immature Granulocytes: 1 %
Lymphocytes Relative: 35 %
Lymphs Abs: 0.6 10*3/uL — ABNORMAL LOW (ref 0.7–4.0)
MCH: 28.8 pg (ref 26.0–34.0)
MCHC: 32.4 g/dL (ref 30.0–36.0)
MCV: 88.9 fL (ref 80.0–100.0)
Monocytes Absolute: 0.1 10*3/uL (ref 0.1–1.0)
Monocytes Relative: 8 %
Neutro Abs: 0.9 10*3/uL — ABNORMAL LOW (ref 1.7–7.7)
Neutrophils Relative %: 53 %
Platelets: 117 10*3/uL — ABNORMAL LOW (ref 150–400)
RBC: 3.61 MIL/uL — ABNORMAL LOW (ref 3.87–5.11)
RDW: 23.8 % — ABNORMAL HIGH (ref 11.5–15.5)
WBC: 1.8 10*3/uL — ABNORMAL LOW (ref 4.0–10.5)
nRBC: 0 % (ref 0.0–0.2)

## 2021-12-26 LAB — IRON AND TIBC
Iron: 86 ug/dL (ref 28–170)
Saturation Ratios: 21 % (ref 10.4–31.8)
TIBC: 414 ug/dL (ref 250–450)
UIBC: 328 ug/dL

## 2021-12-26 LAB — COMPREHENSIVE METABOLIC PANEL
ALT: 12 U/L (ref 0–44)
AST: 32 U/L (ref 15–41)
Albumin: 3.8 g/dL (ref 3.5–5.0)
Alkaline Phosphatase: 169 U/L — ABNORMAL HIGH (ref 38–126)
Anion gap: 6 (ref 5–15)
BUN: 16 mg/dL (ref 8–23)
CO2: 20 mmol/L — ABNORMAL LOW (ref 22–32)
Calcium: 8.8 mg/dL — ABNORMAL LOW (ref 8.9–10.3)
Chloride: 111 mmol/L (ref 98–111)
Creatinine, Ser: 1.75 mg/dL — ABNORMAL HIGH (ref 0.44–1.00)
GFR, Estimated: 29 mL/min — ABNORMAL LOW (ref >60)
Glucose, Bld: 96 mg/dL (ref 70–99)
Potassium: 3.5 mmol/L (ref 3.5–5.1)
Sodium: 137 mmol/L (ref 135–145)
Total Bilirubin: 0.4 mg/dL (ref 0.3–1.2)
Total Protein: 7.1 g/dL (ref 6.5–8.1)

## 2021-12-26 LAB — MAGNESIUM: Magnesium: 2.4 mg/dL (ref 1.7–2.4)

## 2021-12-26 LAB — FERRITIN: Ferritin: 119 ng/mL (ref 11–307)

## 2021-12-26 MED ORDER — DENOSUMAB 120 MG/1.7ML ~~LOC~~ SOLN
120.0000 mg | Freq: Once | SUBCUTANEOUS | Status: AC
  Administered 2021-12-26: 120 mg via SUBCUTANEOUS
  Filled 2021-12-26: qty 1.7

## 2021-12-26 MED ORDER — EPOETIN ALFA-EPBX 10000 UNIT/ML IJ SOLN
10000.0000 [IU] | Freq: Once | INTRAMUSCULAR | Status: AC
  Administered 2021-12-26: 10000 [IU] via SUBCUTANEOUS
  Filled 2021-12-26: qty 1

## 2021-12-26 MED ORDER — EPOETIN ALFA-EPBX 40000 UNIT/ML IJ SOLN: 30000.0000 [IU] | Freq: Once | INTRAMUSCULAR | Status: DC

## 2021-12-26 MED ORDER — EPOETIN ALFA-EPBX 20000 UNIT/ML IJ SOLN
20000.0000 [IU] | Freq: Once | INTRAMUSCULAR | Status: AC
  Administered 2021-12-26: 20000 [IU] via SUBCUTANEOUS
  Filled 2021-12-26: qty 1

## 2021-12-26 NOTE — Telephone Encounter (Signed)
Phar is stating they have not received the medications called in on Monday. Can you please resend?

## 2021-12-26 NOTE — Patient Instructions (Signed)
[facility]  Discharge Instructions: Thank you for choosing [facility] to provide your oncology and hematology care.  If you have a lab appointment with the [facility], please come in thru the Main Entrance and check in at the main information desk.  Wear comfortable clothing and clothing appropriate for easy access to any Portacath or PICC line.   We strive to give you quality time with your provider. You may need to reschedule your appointment if you arrive late (15 or more minutes).  Arriving late affects you and other patients whose appointments are after yours.  Also, if you miss three or more appointments without notifying the office, you may be dismissed from the clinic at the provider's discretion.      For prescription refill requests, have your pharmacy contact our office and allow 72 hours for refills to be completed.    Today you received Retacrit and Xgeva injections     BELOW ARE SYMPTOMS THAT SHOULD BE REPORTED IMMEDIATELY: *FEVER GREATER THAN 100.4 F (38 C) OR HIGHER *CHILLS OR SWEATING *NAUSEA AND VOMITING THAT IS NOT CONTROLLED WITH YOUR NAUSEA MEDICATION *UNUSUAL SHORTNESS OF BREATH *UNUSUAL BRUISING OR BLEEDING *URINARY PROBLEMS (pain or burning when urinating, or frequent urination) *BOWEL PROBLEMS (unusual diarrhea, constipation, pain near the anus) TENDERNESS IN MOUTH AND THROAT WITH OR WITHOUT PRESENCE OF ULCERS (sore throat, sores in mouth, or a toothache) UNUSUAL RASH, SWELLING OR PAIN  UNUSUAL VAGINAL DISCHARGE OR ITCHING   Items with * indicate a potential emergency and should be followed up as soon as possible or go to the Emergency Department if any problems should occur.  Please show the CHEMOTHERAPY ALERT CARD or IMMUNOTHERAPY ALERT CARD at check-in to the Emergency Department and triage nurse.  Should you have questions after your visit or need to cancel or reschedule your appointment, please contact [facility] [phone]  and follow the prompts.  Office hours are 8:00 a.m. to 4:30 p.m. Monday - Friday. Please note that voicemails left after 4:00 p.m. may not be returned until the following business day.  We are closed weekends and major holidays. You have access to a nurse at all times for urgent questions. Please call the main number to the clinic [phone] and follow the prompts.  For any non-urgent questions, you may also contact your provider using MyChart. We now offer e-Visits for anyone 90 and older to request care online for non-urgent symptoms. For details visit [email or web address].   Also download the MyChart app! Go to the app store, search "MyChart", open the app, select [facility], and log in with your MyChart username and password.  Masks are optional in the cancer centers. If you would like for your care team to wear a mask while they are taking care of you, please let them know. You may have one support person who is at least 83 years old accompany you for your appointments.

## 2021-12-26 NOTE — Patient Instructions (Addendum)
[place] at [facility] Discharge Instructions   You were seen and examined today by Dr. Delton Coombes.  He reviewed the results of your lab work which are normal/stable.   Continue taking Ibrance and anastrozole as prescribed.   You will receive Xgeva and Retacrit injections today.   Return as scheduled.    Thank you for choosing [place] at [facility] to provide your oncology and hematology care.  To afford each patient quality time with our provider, please arrive at least 15 minutes before your scheduled appointment time.   If you have a lab appointment with the [facility] please come in thru the Main Entrance and check in at the main information desk.  You need to re-schedule your appointment should you arrive 10 or more minutes late.  We strive to give you quality time with our providers, and arriving late affects you and other patients whose appointments are after yours.  Also, if you no show three or more times for appointments you may be dismissed from the clinic at the providers discretion.     Again, thank you for choosing [facility].  Our hope is that these requests will decrease the amount of time that you wait before being seen by our physicians.       _____________________________________________________________  Should you have questions after your visit to [facility], please contact our office at [phone] and follow the prompts.  Our office hours are 8:00 a.m. and 4:30 p.m. Monday - Friday.  Please note that voicemails left after 4:00 p.m. may not be returned until the following business day.  We are closed weekends and major holidays.  You do have access to a nurse 24-7, just call the main number to the clinic [phone] and do not press any options, hold on the line and a nurse will answer the phone.    For prescription refill requests, have your pharmacy contact our office and allow 72  hours.    Due to Covid, you will need to wear a mask upon entering the hospital. If you do not have a mask, a mask will be given to you at the Main Entrance upon arrival. For doctor visits, patients may have 1 support person age 52 or older with them. For treatment visits, patients can not have anyone with them due to social distancing guidelines and our immunocompromised population.

## 2021-12-26 NOTE — Telephone Encounter (Signed)
Spoke to patient daughter, they do not need any medication sent in , it was a mistake on their part

## 2021-12-26 NOTE — Progress Notes (Signed)
Robin [name] presents today for injection per the provider's orders.  Reatcrit 30,000 units and Xgeva administration without incident; injection site WNL; see MAR for injection details.  Patient tolerated procedure well and without incident.  No questions or complaints noted at this time.   Pt denies tooth or jaw pain and no recent or future dental appointments at this time. Pt reports taking Calcium and Vit D supplements as directed. Pt will receive Retacrit if hemoglobin is less than 11. Pt is switching to every 2 weeks for Retacrit per Dr.K.  Discharged from clinic via wheelchair  in stable condition. Alert and oriented x 3. F/U with [facility] as scheduled.

## 2021-12-26 NOTE — Progress Notes (Signed)
[facility] [address], [place] [address]   Patient Care Team: [name], MD as PCP - General (Internal Medicine) Satira Sark, MD as PCP - Cardiology (Cardiology) Brien Mates, RN as Oncology Nurse Navigator (Oncology) Derek Jack, MD as Medical Oncologist (Medical Oncology)  CHIEF COMPLIANT: Follow-up of left breast cancer with metastases to bones   INTERVAL HISTORY: Robin [name] is a 81 y.o. female seen for follow-up of metastatic breast cancer to the bones.  She is continuing anastrozole and Ibrance 75 mg 3 weeks on/1 week off.  She was recently hospitalized for cellulitis from 11/28/2021 through 12/04/2021.  She missed Ibrance during hospitalization.  REVIEW OF SYSTEMS:   Review of Systems  Cardiovascular:  Positive for leg swelling (Chronic).  Musculoskeletal:  Negative for back pain.  All other systems reviewed and are negative.   I have reviewed the past medical history, past surgical history, social history and family history with the patient and they are unchanged from previous note.   ALLERGIES:   is allergic to codeine.   MEDICATIONS:  Current Outpatient Medications  Medication Sig Dispense Refill   acetaminophen (TYLENOL) 325 MG tablet Take 2 tablets (650 mg total) by mouth every 6 (six) hours as needed for mild pain, fever or headache (or Fever >/= 101). (Patient taking differently: Take 325 mg by mouth every 6 (six) hours as needed for mild pain, fever or headache (or Fever >/= 101).) 12 tablet 0   amLODipine (NORVASC) 10 MG tablet Take 1 tablet (10 mg total) by mouth daily. 90 tablet 3   anastrozole (ARIMIDEX) 1 MG tablet TAKE 1 TABLET BY MOUTH EVERY DAY 30 tablet 3   carvedilol (COREG) 12.5 MG tablet TAKE 1 TABLET (12.5MG TOTAL) BY MOUTH TWICE A DAY WITH MEALS (Patient taking differently: Take 12.5 mg by mouth 2 (two) times daily with a meal.) 90 tablet 3   cetirizine (ZYRTEC) 10 MG tablet Take 1 tablet (10  mg total) by mouth daily. 30 tablet 0   Cholecalciferol (VITAMIN D3) 125 MCG (5000 UT) CAPS TAKE 1 CAPSULE BY MOUTH ONCE DAILY (Patient taking differently: Take 1 capsule by mouth daily.) 90 capsule 1   dicyclomine (BENTYL) 10 MG capsule Take 1 capsule (10 mg total) by mouth 3 (three) times daily as needed for spasms. 20 capsule 0   ELIQUIS 5 MG TABS tablet TAKE 1 TABLET BY MOUTH TWICE A DAY 60 tablet 3   ferrous sulfate 325 (65 FE) MG tablet Take 1 tablet (325 mg total) by mouth daily with breakfast. 30 tablet 0   fluticasone (FLONASE) 50 MCG/ACT nasal spray Place 2 sprays into both nostrils daily. 16 g 1   furosemide (LASIX) 20 MG tablet TAKE 1 TABLET BY MOUTH EVERY DAY AS NEEDED AS DIRECTED (Patient taking differently: Take 20 mg by mouth daily as needed for fluid.) 30 tablet 1   gabapentin (NEURONTIN) 100 MG capsule TAKE 1 CAPSULE BY MOUTH AT BEDTIME. 90 capsule 0   isosorbide mononitrate (IMDUR) 30 MG 24 hr tablet Take 1 tablet (30 mg total) by mouth daily. 90 tablet 2   KLOR-CON M10 10 MEQ tablet TAKE 1 TABLET BY MOUTH EVERY DAY 90 tablet 1   Misc. Devices MISC Please provide with left upper extremity lymphedema sleeve 1 Units 0   nitroGLYCERIN (NITROSTAT) 0.4 MG SL tablet Place 1 tablet (0.4 mg total) under the tongue every 5 (five) minutes x 3 doses as needed. 25 tablet 3  Omega-3 Fatty Acids 300 MG CAPS Take 1 capsule (300 mg total) by mouth 2 (two) times daily. (Patient taking differently: Take 1 capsule by mouth daily.)     palbociclib (IBRANCE) 75 MG tablet Take 1 tablet (75 mg total) by mouth daily. Take for 21 days on, 7 days off, repeat every 28 days. Continue to hold until follow up with oncology service. 21 tablet 3   pantoprazole (PROTONIX) 40 MG tablet Take 1 tablet (40 mg total) by mouth daily. 90 tablet 1   prochlorperazine (COMPAZINE) 10 MG tablet Take 1 tablet (10 mg total) by mouth every 6 (six) hours as needed for nausea or vomiting. 60 tablet 4   rosuvastatin (CRESTOR) 40  MG tablet TAKE 1 TABLET BY MOUTH EVERY DAY 90 tablet 2   SYNTHROID 125 MCG tablet TAKE 1 TABLET BY MOUTH EVERY DAY BEFORE BREAKFAST 30 tablet 3   terbinafine (LAMISIL) 1 % cream Apply 1 Application topically 2 (two) times daily. 42 g 0   traMADol (ULTRAM) 50 MG tablet Take 1 tablet (50 mg total) by mouth every 8 (eight) hours as needed. 90 tablet 0   No current facility-administered medications for this visit.     PHYSICAL EXAMINATION: Performance status (ECOG): 2 - Symptomatic, <50% confined to bed  Vitals:   12/26/21 1106  BP: 138/76  Pulse: (!) 58  Resp: 17  Temp: 98.7 F (37.1 C)  SpO2: 98%   Wt Readings from Last 3 Encounters:  12/26/21 198 lb 3.2 oz (89.9 kg)  12/17/21 205 lb (93 kg)  11/28/21 201 lb (91.2 kg)   Physical Exam Vitals reviewed.  Constitutional:      Appearance: Normal appearance. She is obese.     Comments: In wheelchair  Cardiovascular:     Rate and Rhythm: Normal rate and regular rhythm.     Pulses: Normal pulses.     Heart sounds: Normal heart sounds.  Pulmonary:     Effort: Pulmonary effort is normal.     Breath sounds: Normal breath sounds.  Neurological:     General: No focal deficit present.     Mental Status: She is alert and oriented to person, place, and time.  Psychiatric:        Mood and Affect: Mood normal.        Behavior: Behavior normal.     Breast Exam Chaperone: Thana Ates     LABORATORY DATA:  I have reviewed the data as listed    Latest Ref Rng & Units 12/26/2021    9:15 AM 12/01/2021    5:10 AM 11/29/2021    2:43 AM  CMP  Glucose 70 - 99 mg/dL 96  103  99   BUN 8 - 23 mg/dL _0 Creatinine 0.44 - 1.00 mg/dL 1.75  0.90  0.96   Sodium 135 - 145 mmol/L 137  136  137   Potassium 3.5 - 5.1 mmol/L 3.5  3.4  3.3   Chloride 98 - 111 mmol/L 111  112  111   CO2 22 - 32 mmol/L _1 Calcium 8.9 - 10.3 mg/dL 8.8  7.9  8.4   Total Protein 6.5 - 8.1 g/dL 7.1   6.3   Total Bilirubin 0.3 - 1.2 mg/dL 0.4    0.6   Alkaline Phos 38 - 126 U/L 169   146   AST 15 - 41 U/L 32   23   ALT 0 - 44 U/L  12   16    Lab Results  Component Value Date   CAN153 111.0 (H) 12/18/2021   CAN153 87.0 (H) 10/28/2021   CAN153 112.0 (H) 08/15/2021   Lab Results  Component Value Date   WBC 1.8 (L) 12/26/2021   HGB 10.4 (L) 12/26/2021   HCT 32.1 (L) 12/26/2021   MCV 88.9 12/26/2021   PLT 117 (L) 12/26/2021   NEUTROABS 0.9 (L) 12/26/2021    ASSESSMENT:  1.  Recurrent breast carcinoma with metastases to bones: -History of right breast cancer in 1987, status post right mastectomy requiring no adjuvant chemo or XRT.  Was not on tamoxifen. -History of left breast cancer in 2007, status post mastectomy by Dr. Carlis Abbott at [facility], placed on tamoxifen, discontinued around 2015. -Recent upper back pain, weight loss of 20 pounds in the last 3 to 6 months. -CT angio on 04/04/2020 showed irregular mass in the left chest wall concerning for recurrent breast cancer measuring 4.5 x 3.1 cm. -Also showed lytic lesions in T1 and T3 vertebral bodies as well as manubrium. -Left chest wall mass biopsy on 04/05/2020 consistent with adenocarcinoma compatible with recurrent breast carcinoma.  ER 95% positive, PR 40% positive, HER2 2+, negative by FISH. - PET scan on 05/15/2020 shows hypermetabolic mass in the left anterior chest wall, mild hypermetabolic thoracic lymphadenopathy, diffuse bone metastasis throughout the spine, sternum, both shoulders, pelvis, proximal right femur which are both lytic and sclerotic. - Anastrozole started around 05/21/2020.  She never started palbociclib even though it was recommended at that time. - Bone scan (07/09/2021): Increased uptake over the right frontal bone, cervical, thoracic and lumbar spine.  Multifocal areas of increased activity noted throughout the sacrum and pelvis.  Increased activity noted over the sternum.  Increased activity noted over the proximal right humerus and over the  lateral aspect of the left scapula.  Focal area of increased activity noted over the proximal right femur. - Took Ibrance 100 mg from 06/28/2021 through 07/04/2021, discontinued secondary to nervousness, nausea and vomiting. - Ibrance 75 mg daily started on 09/25/2021   2.  Left upper extremity DVT: -Left upper extremity Doppler on 04/04/2020 + for DVT involving left internal jugular vein, left subclavian vein, left axillary vein, left brachial and left basilic vein.   3.  Family history: - No major family history of malignancies other than her brother with throat cancer.  4.  Left kidney mass: - Last CT scan showed 2.1 x 1.7 cm exophytic soft tissue lesion in the left lateral inferior pole of the left kidney.  It will be followed on subsequent scans.   PLAN:  1.  Metastatic breast cancer to the bones, ER/PR positive: - She is tolerating Ibrance and anastrozole well. - Reviewed labs today which showed alk phos elevated at 169.  Rest of LFTs are normal.  Creatinine is 1.75.  CBC shows white count is 1.8 with ANC around 0.9.  Platelets are 117.  This is from myelosuppression from [place].  She will complete her last dose on 12/29/2021 and will have 1 week off. - Continue Ibrance and anastrozole.  RTC 4 weeks for follow-up.  Will plan to repeat bone scan and tumor markers.   2.  Left upper extremity DVT in the setting of malignancy: - Continue Eliquis 5 mg twice daily. - She has left upper extremity lymphedema.  She was recently hospitalized from 11/28/2021 through 12/04/2021 with cellulitis of the left upper extremity and was treated with antibiotics.   3.  Right sternal/chest wall pain: - Continue tramadol 50 mg 3 times daily as needed.   4.  Bone metastasis: - Calcium is 8.8.  Continue on denosumab monthly.  5.  Normocytic anemia: - She has received Venofer in the past. - We have checked ferritin and iron panel.  Will follow-up on it. - Will change Retacrit to every 2 weeks.  Breast  Cancer therapy associated bone loss: I have recommended calcium, Vitamin D and weight bearing exercises.  Orders placed this encounter:  Orders Placed This Encounter  Procedures   NM Bone Scan Whole Body     The patient has a good understanding of the overall plan. She agrees with it. She will call with any problems that may develop before the next visit here.  Derek Jack, MD [facility] [phone]

## 2022-01-02 ENCOUNTER — Inpatient Hospital Stay: Payer: Medicare PPO

## 2022-01-02 ENCOUNTER — Other Ambulatory Visit: Payer: Self-pay | Admitting: Internal Medicine

## 2022-01-09 ENCOUNTER — Encounter: Payer: Self-pay | Admitting: Internal Medicine

## 2022-01-09 ENCOUNTER — Inpatient Hospital Stay: Payer: Medicare PPO

## 2022-01-09 ENCOUNTER — Ambulatory Visit (INDEPENDENT_AMBULATORY_CARE_PROVIDER_SITE_OTHER): Payer: Medicare PPO | Admitting: Internal Medicine

## 2022-01-09 DIAGNOSIS — J0110 Acute frontal sinusitis, unspecified: Secondary | ICD-10-CM

## 2022-01-09 MED ORDER — AZITHROMYCIN 250 MG PO TABS: ORAL_TABLET | ORAL | 0 refills | Status: AC

## 2022-01-09 MED ORDER — BENZONATATE 100 MG PO CAPS: 100.0000 mg | ORAL_CAPSULE | Freq: Two times a day (BID) | ORAL | 0 refills | Status: DC | PRN

## 2022-01-09 NOTE — Progress Notes (Signed)
Virtual Visit via Telephone Note   This visit type was conducted via telephone. This format is felt to be most appropriate for this patient at this time.  The patient did not have access to video technology/had technical difficulties with video requiring transitioning to audio format only (telephone).  All issues noted in this document were discussed and addressed.  No physical exam could be performed with this format.  Evaluation Performed:  Follow-up visit  Date:  01/09/2022   ID:  Omari, Koslosky 11/05/1940, MRN [id number]  Patient Location: Home Provider Location: Office/Clinic  Participants: Patient Location of Patient: Home Location of Provider: Telehealth Consent was obtain for visit to be over via telehealth. I verified that I am speaking with the correct person using two identifiers.  PCP:  [name], MD   Chief Complaint: Cough and nasal congestion  History of Present Illness:    Robin [name] is a 81 y.o. female who has a televisit for cough, nasal congestion, postnasal drip and sore throat for the last 4 days.  She denies any fever or chills.  She has mild wheezing, but denies any dyspnea.  She has not tried OTC medicines yet.  Her home COVID test was negative.  The patient does not have symptoms concerning for COVID-19 infection (fever, chills, cough, or new shortness of breath).   Past Medical, Surgical, Social History, Allergies, and Medications have been Reviewed.  Past Medical History:  Diagnosis Date   Breast cancer ([place])    Tamoxifen started 2007   CAD (coronary artery disease)    DES second diagonal 2/12   Cardiomyopathy    Probable Takotsubo, LVEF 30-35% 2/12   Carotid artery disease ([facility])    Essential hypertension    Hepatic steatosis    Hyperlipidemia    Hypothyroidism    NSTEMI (non-ST elevated myocardial infarction) ([place])    2/12   Pancreas divisum    Stroke ([place])    (2000) residual left-sided weakness   Type 2 diabetes  mellitus ([place])    reports not currently diabetic   Past Surgical History:  Procedure Laterality Date   ABDOMINAL HYSTERECTOMY     CAROTID ENDARTERECTOMY     Left   CHOLECYSTECTOMY     COLONOSCOPY WITH PROPOFOL N/A 05/03/2020   Procedure: COLONOSCOPY WITH PROPOFOL;  Surgeon: Daneil Dolin, MD;  [facility];  Service: Endoscopy;  Laterality: N/A;   ESOPHAGOGASTRODUODENOSCOPY (EGD) WITH PROPOFOL N/A 05/02/2020   Procedure: ESOPHAGOGASTRODUODENOSCOPY (EGD) WITH PROPOFOL;  Surgeon: Rogene Houston, MD;  Location[facility];  Service: Endoscopy;  Laterality: N/A;   GIVENS CAPSULE STUDY N/A 05/04/2020   Procedure: GIVENS CAPSULE STUDY;  Surgeon: [place] Quale, MD;  Location: [facility];  Service: Gastroenterology;  Laterality: N/A;   JOINT REPLACEMENT     Right knee [facility]- [facility]   MASTECTOMY     Bilateral   POLYPECTOMY  05/03/2020   Procedure: POLYPECTOMY;  Surgeon: Daneil Dolin, MD;  Location: [facility];  Service: Endoscopy;;     No outpatient medications have been marked as taking for the 01/09/22 encounter (Office Visit) with [name], MD.     Allergies:   Codeine   ROS:   Please see the history of present illness.     All other systems reviewed and are negative.   Labs/Other Tests and Data Reviewed:    Recent Labs: 11/29/2021: TSH 5.715 12/26/2021: ALT 12; BUN 16; Creatinine, Ser  1.75; Hemoglobin 10.4; Magnesium 2.4; Platelets 117; Potassium 3.5; Sodium 137   Recent Lipid Panel Lab Results  Component Value Date/Time   CHOL 145 03/01/2020 10:08 AM   TRIG 109 03/01/2020 10:08 AM   HDL 46 (L) 03/01/2020 10:08 AM   CHOLHDL 3.2 03/01/2020 10:08 AM   LDLCALC 80 03/01/2020 10:08 AM    Wt Readings from Last 3 Encounters:  12/26/21 198 lb 3.2 oz (89.9 kg)  12/17/21 205 lb (93 kg)  11/28/21 201 lb (91.2 kg)     ASSESSMENT & PLAN:    Acute sinusitis Started empiric azithromycin as she is immunocompromised,  currently undergoing chemotherapy Tessalon as needed for cough Advised to take Mucinex or Robitussin as needed for cough Nasal saline spray as needed for nasal congestion If persistent wheezing, can give trial of albuterol inhaler  Time:   Today, I have spent 9 minutes reviewing the chart, including problem list, medications, and with the patient with telehealth technology discussing the above problems.   Medication Adjustments/Labs and Tests Ordered: Current medicines are reviewed at length with the patient today.  Concerns regarding medicines are outlined above.   Tests Ordered: No orders of the defined types were placed in this encounter.   Medication Changes: No orders of the defined types were placed in this encounter.    Note: This dictation was prepared with Dragon dictation along with smaller phrase technology. Similar sounding words can be transcribed inadequately or may not be corrected upon review. Any transcriptional errors that result from this process are unintentional.      Disposition:  Follow up  Signed, [name], MD  01/09/2022 8:19 AM     [facility]

## 2022-01-16 ENCOUNTER — Inpatient Hospital Stay: Payer: Medicare PPO

## 2022-01-16 ENCOUNTER — Telehealth: Payer: Self-pay | Admitting: Internal Medicine

## 2022-01-16 ENCOUNTER — Encounter (HOSPITAL_COMMUNITY): Payer: Medicare PPO

## 2022-01-16 ENCOUNTER — Other Ambulatory Visit: Payer: Self-pay | Admitting: Internal Medicine

## 2022-01-16 DIAGNOSIS — N760 Acute vaginitis: Secondary | ICD-10-CM

## 2022-01-16 MED ORDER — FLUCONAZOLE 150 MG PO TABS: 150.0000 mg | ORAL_TABLET | Freq: Once | ORAL | 0 refills | Status: AC

## 2022-01-16 NOTE — Telephone Encounter (Signed)
Pt daughter called stating that the medication her mom was given has caused to get a bladder infection. Wants to know if she can please get something for this?      [facility] [address], [place]

## 2022-01-16 NOTE — Telephone Encounter (Signed)
Patient advised.

## 2022-01-22 ENCOUNTER — Other Ambulatory Visit: Payer: Self-pay

## 2022-01-22 DIAGNOSIS — C7951 Secondary malignant neoplasm of bone: Secondary | ICD-10-CM

## 2022-01-23 ENCOUNTER — Encounter: Payer: Self-pay | Admitting: Hematology

## 2022-01-23 ENCOUNTER — Inpatient Hospital Stay: Payer: Medicare PPO | Admitting: Hematology

## 2022-01-23 ENCOUNTER — Inpatient Hospital Stay: Payer: Medicare PPO | Attending: Hematology

## 2022-01-23 ENCOUNTER — Inpatient Hospital Stay: Payer: Medicare PPO

## 2022-01-23 ENCOUNTER — Telehealth: Payer: Self-pay | Admitting: Internal Medicine

## 2022-01-23 VITALS — BP 128/49 | HR 64 | Temp 98.2°F | Resp 18 | Wt 192.8 lb

## 2022-01-23 DIAGNOSIS — Z170 Estrogen receptor positive status [ER+]: Secondary | ICD-10-CM | POA: Diagnosis not present

## 2022-01-23 DIAGNOSIS — Z86718 Personal history of other venous thrombosis and embolism: Secondary | ICD-10-CM | POA: Diagnosis not present

## 2022-01-23 DIAGNOSIS — C7951 Secondary malignant neoplasm of bone: Secondary | ICD-10-CM

## 2022-01-23 DIAGNOSIS — Z79811 Long term (current) use of aromatase inhibitors: Secondary | ICD-10-CM | POA: Insufficient documentation

## 2022-01-23 DIAGNOSIS — R300 Dysuria: Secondary | ICD-10-CM

## 2022-01-23 DIAGNOSIS — Z79890 Hormone replacement therapy (postmenopausal): Secondary | ICD-10-CM | POA: Insufficient documentation

## 2022-01-23 DIAGNOSIS — N2889 Other specified disorders of kidney and ureter: Secondary | ICD-10-CM | POA: Diagnosis not present

## 2022-01-23 DIAGNOSIS — Z79899 Other long term (current) drug therapy: Secondary | ICD-10-CM | POA: Diagnosis not present

## 2022-01-23 DIAGNOSIS — C50912 Malignant neoplasm of unspecified site of left female breast: Secondary | ICD-10-CM | POA: Diagnosis not present

## 2022-01-23 DIAGNOSIS — D649 Anemia, unspecified: Secondary | ICD-10-CM | POA: Diagnosis not present

## 2022-01-23 DIAGNOSIS — Z7901 Long term (current) use of anticoagulants: Secondary | ICD-10-CM | POA: Diagnosis not present

## 2022-01-23 LAB — CBC WITH DIFFERENTIAL/PLATELET
Abs Immature Granulocytes: 0.01 10*3/uL (ref 0.00–0.07)
Basophils Absolute: 0 10*3/uL (ref 0.0–0.1)
Basophils Relative: 0 %
Eosinophils Absolute: 0.1 10*3/uL (ref 0.0–0.5)
Eosinophils Relative: 2 %
HCT: 35.2 % — ABNORMAL LOW (ref 36.0–46.0)
Hemoglobin: 11.3 g/dL — ABNORMAL LOW (ref 12.0–15.0)
Immature Granulocytes: 0 %
Lymphocytes Relative: 28 %
Lymphs Abs: 0.8 10*3/uL (ref 0.7–4.0)
MCH: 28.8 pg (ref 26.0–34.0)
MCHC: 32.1 g/dL (ref 30.0–36.0)
MCV: 89.8 fL (ref 80.0–100.0)
Monocytes Absolute: 0.2 10*3/uL (ref 0.1–1.0)
Monocytes Relative: 7 %
Neutro Abs: 1.7 10*3/uL (ref 1.7–7.7)
Neutrophils Relative %: 63 %
Platelets: 187 10*3/uL (ref 150–400)
RBC: 3.92 MIL/uL (ref 3.87–5.11)
RDW: 22 % — ABNORMAL HIGH (ref 11.5–15.5)
WBC Morphology: REACTIVE
WBC: 2.7 10*3/uL — ABNORMAL LOW (ref 4.0–10.5)
nRBC: 0 % (ref 0.0–0.2)

## 2022-01-23 LAB — COMPREHENSIVE METABOLIC PANEL
ALT: 19 U/L (ref 0–44)
AST: 55 U/L — ABNORMAL HIGH (ref 15–41)
Albumin: 4 g/dL (ref 3.5–5.0)
Alkaline Phosphatase: 154 U/L — ABNORMAL HIGH (ref 38–126)
Anion gap: 10 (ref 5–15)
BUN: 22 mg/dL (ref 8–23)
CO2: 21 mmol/L — ABNORMAL LOW (ref 22–32)
Calcium: 9.2 mg/dL (ref 8.9–10.3)
Chloride: 106 mmol/L (ref 98–111)
Creatinine, Ser: 1.38 mg/dL — ABNORMAL HIGH (ref 0.44–1.00)
GFR, Estimated: 38 mL/min — ABNORMAL LOW (ref >60)
Glucose, Bld: 93 mg/dL (ref 70–99)
Potassium: 3.9 mmol/L (ref 3.5–5.1)
Sodium: 137 mmol/L (ref 135–145)
Total Bilirubin: 0.9 mg/dL (ref 0.3–1.2)
Total Protein: 7.6 g/dL (ref 6.5–8.1)

## 2022-01-23 LAB — MAGNESIUM: Magnesium: 2.1 mg/dL (ref 1.7–2.4)

## 2022-01-23 MED ORDER — PALBOCICLIB 75 MG PO TABS: 75.0000 mg | ORAL_TABLET | Freq: Every day | ORAL | 3 refills | Status: DC

## 2022-01-23 MED ORDER — DENOSUMAB 120 MG/1.7ML ~~LOC~~ SOLN
120.0000 mg | Freq: Once | SUBCUTANEOUS | Status: AC
  Administered 2022-01-23: 120 mg via SUBCUTANEOUS
  Filled 2022-01-23: qty 1.7

## 2022-01-23 MED ORDER — EPOETIN ALFA-EPBX 40000 UNIT/ML IJ SOLN: 30000.0000 [IU] | Freq: Once | INTRAMUSCULAR | Status: DC

## 2022-01-23 NOTE — Telephone Encounter (Signed)
Called in on patient behalf in regard to  last tele messages on 12/28 fluconazole has not helped . Requesting antibiotic be sent in.   Daughter wants a call back

## 2022-01-23 NOTE — Progress Notes (Signed)
Robin [name] presents today for injection per the provider's orders.  Calcium 9.2. HGB 11.3 . XGeva administration without incident; injection site WNL; see MAR for injection details.  Cholecalciferol ( Vitamin 3) 125 MCG (5000 UT) one capsule daily. Patient tolerated procedure well and without incident. Vital signs stable. No complaints at this time. Discharged from clinic by wheel chair in stable condition. Alert and oriented x 3. F/U with [facility] as scheduled.

## 2022-01-23 NOTE — Patient Instructions (Signed)
[facility]  Discharge Instructions  You were seen and examined today by Dr. Delton Coombes.  Dr. Delton Coombes discussed your most recent lab work which revealed that everything looks good and stable.  Dr. Delton Coombes is going to set you up for IV iron. We will reschedule your bone scan. He sent your Ibrance into [facility].  Follow-up as scheduled in 4 weeks with labs.    Thank you for choosing [facility] to provide your oncology and hematology care.   To afford each patient quality time with our provider, please arrive at least 15 minutes before your scheduled appointment time. You may need to reschedule your appointment if you arrive late (10 or more minutes). Arriving late affects you and other patients whose appointments are after yours.  Also, if you miss three or more appointments without notifying the office, you may be dismissed from the clinic at the provider's discretion.    Again, thank you for choosing [facility].  Our hope is that these requests will decrease the amount of time that you wait before being seen by our physicians.   If you have a lab appointment with the [facility] please come in thru the Main Entrance and check in at the main information desk.           _____________________________________________________________  Should you have questions after your visit to [facility], please contact our office at [phone] and follow the prompts.  Our office hours are 8:00 a.m. to 4:30 p.m. Monday - Thursday and 8:00 a.m. to 2:30 p.m. Friday.  Please note that voicemails left after 4:00 p.m. may not be returned until the following business day.  We are closed weekends and all major holidays.  You do have access to a nurse 24-7, just call the main number to the clinic [phone] and do not press any options, hold on the line and a nurse will answer the phone.    For prescription refill  requests, have your pharmacy contact our office and allow 72 hours.    Masks are optional in the cancer centers. If you would like for your care team to wear a mask while they are taking care of you, please let them know. You may have one support person who is at least 84 years old accompany you for your appointments.

## 2022-01-23 NOTE — Progress Notes (Signed)
[facility] [address]  [address]   Patient Care Team: [name], MD as PCP - General (Internal Medicine) Satira Sark, MD as PCP - Cardiology (Cardiology) Brien Mates, RN as Oncology Nurse Navigator (Oncology) Derek Jack, MD as Medical Oncologist (Medical Oncology)  CHIEF COMPLIANT: Follow-up of left breast cancer with metastases to bones   INTERVAL HISTORY: Robin [name] is a 82 y.o. female seen for follow-up of metastatic breast cancer to the bones.  Robin [name] is continuing anastrozole and Ibrance 75 mg 3 weeks on/1 week off.  Denies any recent hospitalizations.  Reports energy level 75%.  Denies any bleeding per rectum or melena.  REVIEW OF SYSTEMS:   Review of Systems  Cardiovascular:  Positive for leg swelling (Chronic).  Musculoskeletal:  Negative for back pain.  All other systems reviewed and are negative.   I have reviewed the past medical history, past surgical history, social history and family history with the patient and they are unchanged from previous note.   ALLERGIES:   is allergic to codeine.   MEDICATIONS:  Current Outpatient Medications  Medication Sig Dispense Refill   acetaminophen (TYLENOL) 325 MG tablet Take 2 tablets (650 mg total) by mouth every 6 (six) hours as needed for mild pain, fever or headache (or Fever >/= 101). (Patient taking differently: Take 325 mg by mouth every 6 (six) hours as needed for mild pain, fever or headache (or Fever >/= 101).) 12 tablet 0   amLODipine (NORVASC) 10 MG tablet Take 1 tablet (10 mg total) by mouth daily. 90 tablet 3   anastrozole (ARIMIDEX) 1 MG tablet TAKE 1 TABLET BY MOUTH EVERY DAY 30 tablet 3   benzonatate (TESSALON) 100 MG capsule Take 1 capsule (100 mg total) by mouth 2 (two) times daily as needed for cough. 20 capsule 0   carvedilol (COREG) 12.5 MG tablet TAKE 1 TABLET (12.5MG TOTAL) BY MOUTH TWICE A DAY WITH MEALS (Patient taking differently: Take  12.5 mg by mouth 2 (two) times daily with a meal.) 90 tablet 3   cetirizine (ZYRTEC) 10 MG tablet Take 1 tablet (10 mg total) by mouth daily. 30 tablet 0   Cholecalciferol (VITAMIN D3) 125 MCG (5000 UT) CAPS TAKE 1 CAPSULE BY MOUTH ONCE DAILY (Patient taking differently: Take 1 capsule by mouth daily.) 90 capsule 1   dicyclomine (BENTYL) 10 MG capsule Take 1 capsule (10 mg total) by mouth 3 (three) times daily as needed for spasms. 20 capsule 0   ELIQUIS 5 MG TABS tablet TAKE 1 TABLET BY MOUTH TWICE A DAY 60 tablet 3   ferrous sulfate 325 (65 FE) MG tablet Take 1 tablet (325 mg total) by mouth daily with breakfast. 30 tablet 0   fluconazole (DIFLUCAN) 150 MG tablet Take 150 mg by mouth once.     fluticasone (FLONASE) 50 MCG/ACT nasal spray Place 2 sprays into both nostrils daily. 16 g 1   furosemide (LASIX) 20 MG tablet TAKE 1 TABLET BY MOUTH EVERY DAY AS NEEDED AS DIRECTED (Patient taking differently: Take 20 mg by mouth daily as needed for fluid.) 30 tablet 1   gabapentin (NEURONTIN) 100 MG capsule TAKE 1 CAPSULE BY MOUTH AT BEDTIME. 90 capsule 0   isosorbide mononitrate (IMDUR) 30 MG 24 hr tablet Take 1 tablet (30 mg total) by mouth daily. 90 tablet 2   KLOR-CON M10 10 MEQ tablet TAKE 1 TABLET BY MOUTH EVERY DAY 90 tablet 1   Misc. Devices  MISC Please provide with left upper extremity lymphedema sleeve 1 Units 0   nitroGLYCERIN (NITROSTAT) 0.4 MG SL tablet Place 1 tablet (0.4 mg total) under the tongue every 5 (five) minutes x 3 doses as needed. 25 tablet 3   Omega-3 Fatty Acids 300 MG CAPS Take 1 capsule (300 mg total) by mouth 2 (two) times daily. (Patient taking differently: Take 1 capsule by mouth daily.)     pantoprazole (PROTONIX) 40 MG tablet Take 1 tablet (40 mg total) by mouth daily. 90 tablet 1   prochlorperazine (COMPAZINE) 10 MG tablet Take 1 tablet (10 mg total) by mouth every 6 (six) hours as needed for nausea or vomiting. 60 tablet 4   rosuvastatin (CRESTOR) 40 MG tablet TAKE 1  TABLET BY MOUTH EVERY DAY 90 tablet 2   SYNTHROID 125 MCG tablet TAKE 1 TABLET BY MOUTH EVERY DAY BEFORE BREAKFAST 30 tablet 3   terbinafine (LAMISIL) 1 % cream Apply 1 Application topically 2 (two) times daily. 42 g 0   traMADol (ULTRAM) 50 MG tablet Take 1 tablet (50 mg total) by mouth every 8 (eight) hours as needed. 90 tablet 0   palbociclib (IBRANCE) 75 MG tablet Take 1 tablet (75 mg total) by mouth daily. Take for 21 days on, 7 days off, repeat every 28 days. Continue to hold until follow up with oncology service. 21 tablet 3   No current facility-administered medications for this visit.     PHYSICAL EXAMINATION: Performance status (ECOG): 2 - Symptomatic, <50% confined to bed  Vitals:   01/23/22 1411  BP: (!) 128/49  Pulse: 64  Resp: 18  Temp: 98.2 F (36.8 C)  SpO2: 100%   Wt Readings from Last 3 Encounters:  01/23/22 192 lb 12.8 oz (87.5 kg)  12/26/21 198 lb 3.2 oz (89.9 kg)  12/17/21 205 lb (93 kg)   Physical Exam Vitals reviewed.  Constitutional:      Appearance: Normal appearance. Robin [name] is obese.     Comments: In wheelchair  Cardiovascular:     Rate and Rhythm: Normal rate and regular rhythm.     Pulses: Normal pulses.     Heart sounds: Normal heart sounds.  Pulmonary:     Effort: Pulmonary effort is normal.     Breath sounds: Normal breath sounds.  Neurological:     General: No focal deficit present.     Mental Status: Robin [name] is alert and oriented to person, place, and time.  Psychiatric:        Mood and Affect: Mood normal.        Behavior: Behavior normal.     Breast Exam Chaperone: Thana Ates     LABORATORY DATA:  I have reviewed the data as listed    Latest Ref Rng & Units 01/23/2022    1:48 PM 12/26/2021    9:15 AM 12/01/2021    5:10 AM  CMP  Glucose 70 - 99 mg/dL 93  96  103   BUN 8 - 23 mg/dL _0 Creatinine 0.44 - 1.00 mg/dL 1.38  1.75  0.90   Sodium 135 - 145 mmol/L 137  137  136   Potassium 3.5 - 5.1 mmol/L 3.9  3.5  3.4    Chloride 98 - 111 mmol/L 106  111  112   CO2 22 - 32 mmol/L _1 Calcium 8.9 - 10.3 mg/dL 9.2  8.8  7.9   Total Protein 6.5 - 8.1 g/dL 7.6  7.1    Total Bilirubin 0.3 - 1.2 mg/dL 0.9  0.4    Alkaline Phos 38 - 126 U/L 154  169    AST 15 - 41 U/L 55  32    ALT 0 - 44 U/L 19  12     Lab Results  Component Value Date   CAN153 111.0 (H) 12/18/2021   CAN153 87.0 (H) 10/28/2021   CAN153 112.0 (H) 08/15/2021   Lab Results  Component Value Date   WBC 2.7 (L) 01/23/2022   HGB 11.3 (L) 01/23/2022   HCT 35.2 (L) 01/23/2022   MCV 89.8 01/23/2022   PLT 187 01/23/2022   NEUTROABS 1.7 01/23/2022    ASSESSMENT:  1.  Recurrent breast carcinoma with metastases to bones: -History of right breast cancer in 1987, status post right mastectomy requiring no adjuvant chemo or XRT.  Was not on tamoxifen. -History of left breast cancer in 2007, status post mastectomy by Dr. Carlis Abbott at [facility], placed on tamoxifen, discontinued around 2015. -Recent upper back pain, weight loss of 20 pounds in the last 3 to 6 months. -CT angio on 04/04/2020 showed irregular mass in the left chest wall concerning for recurrent breast cancer measuring 4.5 x 3.1 cm. -Also showed lytic lesions in T1 and T3 vertebral bodies as well as manubrium. -Left chest wall mass biopsy on 04/05/2020 consistent with adenocarcinoma compatible with recurrent breast carcinoma.  ER 95% positive, PR 40% positive, HER2 2+, negative by FISH. - PET scan on 05/15/2020 shows hypermetabolic mass in the left anterior chest wall, mild hypermetabolic thoracic lymphadenopathy, diffuse bone metastasis throughout the spine, sternum, both shoulders, pelvis, proximal right femur which are both lytic and sclerotic. - Anastrozole started around 05/21/2020.  Robin [name] never started palbociclib even though it was recommended at that time. - Bone scan (07/09/2021): Increased uptake over the right frontal bone, cervical, thoracic and lumbar spine.   Multifocal areas of increased activity noted throughout the sacrum and pelvis.  Increased activity noted over the sternum.  Increased activity noted over the proximal right humerus and over the lateral aspect of the left scapula.  Focal area of increased activity noted over the proximal right femur. - Took Ibrance 100 mg from 06/28/2021 through 07/04/2021, discontinued secondary to nervousness, nausea and vomiting. - Ibrance 75 mg daily started on 09/25/2021   2.  Left upper extremity DVT: -Left upper extremity Doppler on 04/04/2020 + for DVT involving left internal jugular vein, left subclavian vein, left axillary vein, left brachial and left basilic vein.   3.  Family history: - No major family history of malignancies other than her brother with throat cancer.  4.  Left kidney mass: - Last CT scan showed 2.1 x 1.7 cm exophytic soft tissue lesion in the left lateral inferior pole of the left kidney.  It will be followed on subsequent scans.   PLAN:  1.  Metastatic breast cancer to the bones, ER/PR positive: - Robin [name] is tolerating Ibrance and anastrozole very well. - Robin [name] missed her bone scan as Robin [name] got sick prior to Christmas. - Reviewed labs today which showed AST 55, alk phos improved.  Creatinine is 1.38 and at baseline.  CBC shows leukopenia with ANC normal 1.7.  Platelet count and hemoglobin are near normal.  Last CA125 was 111. - Recommend continuing Ibrance and anastrozole. - Recommend bone scan prior to next visit in 4 weeks. - Daughter reports that her mother is having symptoms of UTI.  I have recommended checking UA today  along with culture.   2.  Left upper extremity DVT in the setting of malignancy: - Continue Eliquis 5 mg twice daily.  No bleeding issues.   3.  Right sternal/chest wall pain: - Continue tramadol 50 mg 3 times daily as needed.   4.  Bone metastasis: -Calcium is 9.2.  Continue denosumab monthly.  5.  Normocytic anemia: - Ferritin is 119 and percent saturation 21.   Recommend Venofer 400 mg IV x 1 at the time of next visit.  Robin [name] does not require Retacrit today.  Last Retacrit 30,000 units on 12/26/2021.  Last Venofer on 11/22/2021.  Breast Cancer therapy associated bone loss: I have recommended calcium, Vitamin D and weight bearing exercises.  Orders placed this encounter:  Orders Placed This Encounter  Procedures   Urine culture   CBC with Differential/Platelet   Comprehensive metabolic panel   Magnesium   Urinalysis, Routine w reflex microscopic     The patient has a good understanding of the overall plan. Robin [name] agrees with it. Robin [name] will call with any problems that may develop before the next visit here.  Derek Jack, MD [place] [phone]

## 2022-01-24 ENCOUNTER — Other Ambulatory Visit: Payer: Self-pay | Admitting: Internal Medicine

## 2022-01-24 DIAGNOSIS — N3000 Acute cystitis without hematuria: Secondary | ICD-10-CM

## 2022-01-24 LAB — CANCER ANTIGEN 27.29: CA 27.29: 285.5 U/mL — ABNORMAL HIGH (ref 0.0–38.6)

## 2022-01-24 MED ORDER — SULFAMETHOXAZOLE-TRIMETHOPRIM 800-160 MG PO TABS: 1.0000 | ORAL_TABLET | Freq: Two times a day (BID) | ORAL | 0 refills | Status: DC

## 2022-01-24 NOTE — Telephone Encounter (Signed)
Patient daughter advised

## 2022-01-25 LAB — CANCER ANTIGEN 15-3: CA 15-3: 165 U/mL — ABNORMAL HIGH (ref 0.0–25.0)

## 2022-01-28 ENCOUNTER — Ambulatory Visit: Payer: Medicare PPO

## 2022-01-30 ENCOUNTER — Other Ambulatory Visit (HOSPITAL_COMMUNITY): Payer: Self-pay

## 2022-01-30 ENCOUNTER — Telehealth: Payer: Self-pay

## 2022-01-30 NOTE — Telephone Encounter (Signed)
Received notification that grant funding opened up for patient's disease state. Holding off on manufacturer assistance until needed.   Robin [name], [facility] Patient [place]  [phone] (phone) [phone] (fax) 01/30/2022 2:03 PM

## 2022-01-30 NOTE — Telephone Encounter (Signed)
Oral Oncology Patient Advocate Encounter  Was successful in securing patient a $15,000 grant from [employer] to provide copayment coverage for [place].  This will keep the out of pocket expense at $0.     [employer] ID: [id number]  I have spoken with the patient.   The billing information is as follows and has been shared with [facility].    RxBin: [id number] PCN: [id number] Member ID: [id number] Group ID: [id number] Dates of Eligibility: 12/31/21 through 12/31/22  Fund:  [place], [facility] Patient [place]  [phone] (phone) [phone] (fax) 01/30/2022 1:56 PM

## 2022-02-03 ENCOUNTER — Other Ambulatory Visit (HOSPITAL_COMMUNITY): Payer: Self-pay

## 2022-02-03 ENCOUNTER — Encounter (HOSPITAL_COMMUNITY): Payer: Self-pay | Admitting: Hematology

## 2022-02-04 ENCOUNTER — Encounter (HOSPITAL_COMMUNITY): Payer: Medicare PPO

## 2022-02-05 ENCOUNTER — Other Ambulatory Visit (HOSPITAL_COMMUNITY): Payer: Self-pay

## 2022-02-06 ENCOUNTER — Inpatient Hospital Stay: Payer: Medicare PPO

## 2022-02-11 ENCOUNTER — Encounter (HOSPITAL_COMMUNITY)
Admission: RE | Admit: 2022-02-11 | Discharge: 2022-02-11 | Disposition: A | Discharge status: Home / Self Care | Payer: Medicare PPO | Source: Ambulatory Visit | Attending: Hematology | Admitting: Hematology

## 2022-02-11 DIAGNOSIS — C7951 Secondary malignant neoplasm of bone: Secondary | ICD-10-CM

## 2022-02-11 DIAGNOSIS — C50919 Malignant neoplasm of unspecified site of unspecified female breast: Secondary | ICD-10-CM | POA: Diagnosis not present

## 2022-02-11 MED ORDER — TECHNETIUM TC 99M MEDRONATE IV KIT
20.0000 | PACK | Freq: Once | INTRAVENOUS | Status: AC | PRN
  Administered 2022-02-11: 19 via INTRAVENOUS

## 2022-02-19 ENCOUNTER — Other Ambulatory Visit (HOSPITAL_COMMUNITY): Payer: Self-pay

## 2022-02-19 DIAGNOSIS — B338 Other specified viral diseases: Secondary | ICD-10-CM | POA: Diagnosis not present

## 2022-02-19 NOTE — Telephone Encounter (Signed)
Left VM for patient and two daughters listed as contacts on patient's chart to call back to set up delivery and switch over to [facility]. One daughter, [name], called back to let me know her other sister, [name], would call me back to set up everything. I am awaiting a call from her to get delivery set-up for patient.   Berdine Addison, [facility] Patient [place]  [phone] (phone) [phone] (fax) 02/19/2022 1:26 PM

## 2022-02-24 ENCOUNTER — Inpatient Hospital Stay: Payer: Medicare PPO | Attending: Hematology

## 2022-02-24 ENCOUNTER — Inpatient Hospital Stay: Payer: Medicare PPO

## 2022-02-24 ENCOUNTER — Telehealth: Payer: Self-pay | Admitting: Licensed Clinical Social Worker

## 2022-02-24 ENCOUNTER — Encounter: Payer: Self-pay | Admitting: Hematology

## 2022-02-24 ENCOUNTER — Inpatient Hospital Stay: Payer: Medicare PPO | Admitting: Hematology

## 2022-02-24 VITALS — BP 152/66 | HR 65 | Temp 97.5°F | Resp 18 | Wt 190.0 lb

## 2022-02-24 VITALS — BP 142/50 | HR 57 | Temp 96.3°F | Resp 18

## 2022-02-24 DIAGNOSIS — Z170 Estrogen receptor positive status [ER+]: Secondary | ICD-10-CM | POA: Diagnosis not present

## 2022-02-24 DIAGNOSIS — D649 Anemia, unspecified: Secondary | ICD-10-CM | POA: Diagnosis not present

## 2022-02-24 DIAGNOSIS — Z79899 Other long term (current) drug therapy: Secondary | ICD-10-CM | POA: Diagnosis not present

## 2022-02-24 DIAGNOSIS — Z86718 Personal history of other venous thrombosis and embolism: Secondary | ICD-10-CM | POA: Diagnosis not present

## 2022-02-24 DIAGNOSIS — Z7901 Long term (current) use of anticoagulants: Secondary | ICD-10-CM | POA: Diagnosis not present

## 2022-02-24 DIAGNOSIS — Z79890 Hormone replacement therapy (postmenopausal): Secondary | ICD-10-CM | POA: Diagnosis not present

## 2022-02-24 DIAGNOSIS — Z79811 Long term (current) use of aromatase inhibitors: Secondary | ICD-10-CM | POA: Insufficient documentation

## 2022-02-24 DIAGNOSIS — C7951 Secondary malignant neoplasm of bone: Secondary | ICD-10-CM | POA: Diagnosis not present

## 2022-02-24 DIAGNOSIS — C50912 Malignant neoplasm of unspecified site of left female breast: Secondary | ICD-10-CM | POA: Insufficient documentation

## 2022-02-24 LAB — CBC WITH DIFFERENTIAL/PLATELET
Abs Immature Granulocytes: 0.01 10*3/uL (ref 0.00–0.07)
Basophils Absolute: 0 10*3/uL (ref 0.0–0.1)
Basophils Relative: 1 %
Eosinophils Absolute: 0 10*3/uL (ref 0.0–0.5)
Eosinophils Relative: 2 %
HCT: 33.4 % — ABNORMAL LOW (ref 36.0–46.0)
Hemoglobin: 10.9 g/dL — ABNORMAL LOW (ref 12.0–15.0)
Immature Granulocytes: 1 %
Lymphocytes Relative: 26 %
Lymphs Abs: 0.5 10*3/uL — ABNORMAL LOW (ref 0.7–4.0)
MCH: 29.4 pg (ref 26.0–34.0)
MCHC: 32.6 g/dL (ref 30.0–36.0)
MCV: 90 fL (ref 80.0–100.0)
Monocytes Absolute: 0.2 10*3/uL (ref 0.1–1.0)
Monocytes Relative: 7 %
Neutro Abs: 1.3 10*3/uL — ABNORMAL LOW (ref 1.7–7.7)
Neutrophils Relative %: 63 %
Platelets: 166 10*3/uL (ref 150–400)
RBC: 3.71 MIL/uL — ABNORMAL LOW (ref 3.87–5.11)
RDW: 20.3 % — ABNORMAL HIGH (ref 11.5–15.5)
WBC: 2 10*3/uL — ABNORMAL LOW (ref 4.0–10.5)
nRBC: 0 % (ref 0.0–0.2)

## 2022-02-24 LAB — COMPREHENSIVE METABOLIC PANEL
ALT: 12 U/L (ref 0–44)
AST: 54 U/L — ABNORMAL HIGH (ref 15–41)
Albumin: 4 g/dL (ref 3.5–5.0)
Alkaline Phosphatase: 144 U/L — ABNORMAL HIGH (ref 38–126)
Anion gap: 10 (ref 5–15)
BUN: 18 mg/dL (ref 8–23)
CO2: 18 mmol/L — ABNORMAL LOW (ref 22–32)
Calcium: 8.8 mg/dL — ABNORMAL LOW (ref 8.9–10.3)
Chloride: 108 mmol/L (ref 98–111)
Creatinine, Ser: 1.37 mg/dL — ABNORMAL HIGH (ref 0.44–1.00)
GFR, Estimated: 38 mL/min — ABNORMAL LOW (ref >60)
Glucose, Bld: 97 mg/dL (ref 70–99)
Potassium: 3.9 mmol/L (ref 3.5–5.1)
Sodium: 136 mmol/L (ref 135–145)
Total Bilirubin: 1 mg/dL (ref 0.3–1.2)
Total Protein: 7.5 g/dL (ref 6.5–8.1)

## 2022-02-24 LAB — MAGNESIUM: Magnesium: 2.2 mg/dL (ref 1.7–2.4)

## 2022-02-24 MED ORDER — DENOSUMAB 120 MG/1.7ML ~~LOC~~ SOLN
120.0000 mg | Freq: Once | SUBCUTANEOUS | Status: AC
  Administered 2022-02-24: 120 mg via SUBCUTANEOUS
  Filled 2022-02-24: qty 1.7

## 2022-02-24 MED ORDER — SODIUM CHLORIDE 0.9 % IV SOLN: Freq: Once | INTRAVENOUS | Status: AC

## 2022-02-24 MED ORDER — SODIUM CHLORIDE 0.9 % IV SOLN
400.0000 mg | Freq: Once | INTRAVENOUS | Status: AC
  Administered 2022-02-24: 400 mg via INTRAVENOUS
  Filled 2022-02-24: qty 20

## 2022-02-24 MED ORDER — ACETAMINOPHEN 325 MG PO TABS
650.0000 mg | ORAL_TABLET | Freq: Once | ORAL | Status: AC
  Administered 2022-02-24: 650 mg via ORAL
  Filled 2022-02-24: qty 2

## 2022-02-24 MED ORDER — CETIRIZINE HCL 10 MG PO TABS
10.0000 mg | ORAL_TABLET | Freq: Once | ORAL | Status: AC
  Administered 2022-02-24: 10 mg via ORAL
  Filled 2022-02-24: qty 1

## 2022-02-24 NOTE — Patient Instructions (Signed)
[facility]  Discharge Instructions  You were seen and examined today by Dr. Delton Coombes.  Your bone scan shows progression of disease. Dr. Delton Coombes will change your treatment from [facility].  Dr. Delton Coombes has recommended a CT CAP and a left hip X-Ray. Dr. Delton Coombes will also refer you for genetic testing.  Follow-up as scheduled.  Thank you for choosing [facility] to provide your oncology and hematology care.   To afford each patient quality time with our provider, please arrive at least 15 minutes before your scheduled appointment time. You may need to reschedule your appointment if you arrive late (10 or more minutes). Arriving late affects you and other patients whose appointments are after yours.  Also, if you miss three or more appointments without notifying the office, you may be dismissed from the clinic at the provider's discretion.    Again, thank you for choosing [facility].  Our hope is that these requests will decrease the amount of time that you wait before being seen by our physicians.   If you have a lab appointment with the [facility] please come in thru the Main Entrance and check in at the main information desk.           _____________________________________________________________  Should you have questions after your visit to [facility], please contact our office at [phone] and follow the prompts.  Our office hours are 8:00 a.m. to 4:30 p.m. Monday - Thursday and 8:00 a.m. to 2:30 p.m. Friday.  Please note that voicemails left after 4:00 p.m. may not be returned until the following business day.  We are closed weekends and all major holidays.  You do have access to a nurse 24-7, just call the main number to the clinic [phone] and do not press any options, hold on the line and a nurse will answer the phone.    For prescription refill requests, have your pharmacy contact  our office and allow 72 hours.    Masks are optional in the cancer centers. If you would like for your care team to wear a mask while they are taking care of you, please let them know. You may have one support person who is at least 84 years old accompany you for your appointments.

## 2022-02-24 NOTE — Telephone Encounter (Signed)
[facility] STAT testing ordered per Dr. Tomie China request.

## 2022-02-24 NOTE — Patient Instructions (Signed)
[facility]  Discharge Instructions: Thank you for choosing [facility] to provide your oncology and hematology care.  If you have a lab appointment with the Cancer Center, please come in thru the Main Entrance and check in at the main information desk.  Wear comfortable clothing and clothing appropriate for easy access to any Portacath or PICC line.   We strive to give you quality time with your provider. You may need to reschedule your appointment if you arrive late (15 or more minutes).  Arriving late affects you and other patients whose appointments are after yours.  Also, if you miss three or more appointments without notifying the office, you may be dismissed from the clinic at the provider's discretion.      For prescription refill requests, have your pharmacy contact our office and allow 72 hours for refills to be completed.    Today you received the following chemotherapy and/or immunotherapy agents Venofer      To help prevent nausea and vomiting after your treatment, we encourage you to take your nausea medication as directed.  BELOW ARE SYMPTOMS THAT SHOULD BE REPORTED IMMEDIATELY: *FEVER GREATER THAN 100.4 F (38 C) OR HIGHER *CHILLS OR SWEATING *NAUSEA AND VOMITING THAT IS NOT CONTROLLED WITH YOUR NAUSEA MEDICATION *UNUSUAL SHORTNESS OF BREATH *UNUSUAL BRUISING OR BLEEDING *URINARY PROBLEMS (pain or burning when urinating, or frequent urination) *BOWEL PROBLEMS (unusual diarrhea, constipation, pain near the anus) TENDERNESS IN MOUTH AND THROAT WITH OR WITHOUT PRESENCE OF ULCERS (sore throat, sores in mouth, or a toothache) UNUSUAL RASH, SWELLING OR PAIN  UNUSUAL VAGINAL DISCHARGE OR ITCHING   Items with * indicate a potential emergency and should be followed up as soon as possible or go to the Emergency Department if any problems should occur.  Please show the CHEMOTHERAPY ALERT CARD or IMMUNOTHERAPY ALERT CARD at check-in to the Emergency  Department and triage nurse.  Should you have questions after your visit or need to cancel or reschedule your appointment, please contact [facility] [phone]  and follow the prompts.  Office hours are 8:00 a.m. to 4:30 p.m. Monday - Friday. Please note that voicemails left after 4:00 p.m. may not be returned until the following business day.  We are closed weekends and major holidays. You have access to a nurse at all times for urgent questions. Please call the main number to the clinic [phone] and follow the prompts.  For any non-urgent questions, you may also contact your provider using MyChart. We now offer e-Visits for anyone 18 and older to request care online for non-urgent symptoms. For details visit [email or web address].   Also download the MyChart app! Go to the app store, search "MyChart", open the app, select [facility], and log in with your MyChart username and password.   

## 2022-02-24 NOTE — Telephone Encounter (Signed)
Received notification from MD Office that disease has progressed therefore, [facility] will be changing. Medication Assistance no longer needed for Ibrance, however we do have an active grant now to help cover the cost of new medication when decided on.   Berdine Addison, [facility] Patient [place]  [phone] (phone) [phone] (fax) 02/24/2022 11:22 AM

## 2022-02-24 NOTE — Progress Notes (Signed)
Patient presents today for Venofer infusion and Xgeva injection per providers order.  Vital signs WNL  Patient has no new complaints at this time.  Patient is taking Calcium and vitamin D supplements, has no prior or up-coming dental work and no jaw pain.  Treatment given today per MD orders.  Stable during infusion and injection without adverse affects.  See MAR for injection site details.  Vital signs stable.  No complaints at this time.  Discharge from clinic ambulatory in stable condition.  Alert and oriented X 3.  Follow up with [facility] as scheduled.

## 2022-02-24 NOTE — Progress Notes (Signed)
[facility] [address], [place] [address]   CLINIC:  Medical Oncology/Hematology  Patient Care Team: [name], MD as PCP - General (Internal Medicine) Satira Sark, MD as PCP - Cardiology (Cardiology) Brien Mates, RN as Oncology Nurse Navigator (Oncology) Derek Jack, MD as Medical Oncologist (Medical Oncology)   PRIOR THERAPY: Bilateral mastectomies  CURRENT THERAPY: Anastrozole started around 05/21/2020, Ibrance   CHIEF COMPLIANT: Follow-up of left breast cancer with metastases to bones   INTERVAL HISTORY: [name] is a 82 y.o. female seen for follow-up of metastatic breast cancer to the bones.  She is continuing anastrozole and Ibrance 75 mg 3 weeks on/1 week off. She was last seen by me on 01/23/22. She is accompanied by her daughter for today's visit.  Today, she states that she is doing well overall. Her appetite level is at 0%. Her energy level is at 75%. She denies any new pains. She has been compliant with Ibrance without any missed doses.  We discussed her NM bone scan results from 02/13/22.   REVIEW OF SYSTEMS:   Review of Systems  Constitutional:  Positive for fatigue. Negative for chills and fever.  HENT:   Negative for lump/mass, mouth sores, nosebleeds, sore throat and trouble swallowing.   Eyes:  Negative for eye problems.  Respiratory:  Negative for cough.   Cardiovascular:  Negative for chest pain, leg swelling and palpitations.  Gastrointestinal:  Negative for abdominal pain, constipation, diarrhea, nausea and vomiting.  Genitourinary:  Negative for bladder incontinence, difficulty urinating, dysuria, frequency, hematuria and nocturia.   Musculoskeletal:  Negative for arthralgias, back pain, flank pain, myalgias and neck pain.  Skin:  Negative for itching and rash.  Neurological:  Positive for headaches. Negative for dizziness and numbness.  Hematological:  Does not bruise/bleed easily.   Psychiatric/Behavioral:  Negative for depression, sleep disturbance and suicidal ideas. The patient is not nervous/anxious.   All other systems reviewed and are negative.   I have reviewed the past medical history, past surgical history, social history and family history with the patient and they are unchanged from previous note.   ALLERGIES:   is allergic to codeine.   MEDICATIONS:  Current Outpatient Medications  Medication Sig Dispense Refill   acetaminophen (TYLENOL) 325 MG tablet Take 2 tablets (650 mg total) by mouth every 6 (six) hours as needed for mild pain, fever or headache (or Fever >/= 101). (Patient taking differently: Take 325 mg by mouth every 6 (six) hours as needed for mild pain, fever or headache (or Fever >/= 101).) 12 tablet 0   amLODipine (NORVASC) 10 MG tablet Take 1 tablet (10 mg total) by mouth daily. 90 tablet 3   anastrozole (ARIMIDEX) 1 MG tablet TAKE 1 TABLET BY MOUTH EVERY DAY 30 tablet 3   benzonatate (TESSALON) 100 MG capsule Take 1 capsule (100 mg total) by mouth 2 (two) times daily as needed for cough. 20 capsule 0   carvedilol (COREG) 12.5 MG tablet TAKE 1 TABLET (12.'5MG'$  TOTAL) BY MOUTH TWICE A DAY WITH MEALS (Patient taking differently: Take 12.5 mg by mouth 2 (two) times daily with a meal.) 90 tablet 3   cetirizine (ZYRTEC) 10 MG tablet Take 1 tablet (10 mg total) by mouth daily. 30 tablet 0   Cholecalciferol (VITAMIN D3) 125 MCG (5000 UT) CAPS TAKE 1 CAPSULE BY MOUTH ONCE DAILY (Patient taking differently: Take 1 capsule by mouth daily.) 90 capsule 1   dicyclomine (BENTYL) 10 MG  capsule Take 1 capsule (10 mg total) by mouth 3 (three) times daily as needed for spasms. 20 capsule 0   ELIQUIS 5 MG TABS tablet TAKE 1 TABLET BY MOUTH TWICE A DAY 60 tablet 3   ferrous sulfate 325 (65 FE) MG tablet Take 1 tablet (325 mg total) by mouth daily with breakfast. 30 tablet 0   fluconazole (DIFLUCAN) 150 MG tablet Take 150 mg by mouth once.     fluticasone (FLONASE)  50 MCG/ACT nasal spray Place 2 sprays into both nostrils daily. 16 g 1   furosemide (LASIX) 20 MG tablet TAKE 1 TABLET BY MOUTH EVERY DAY AS NEEDED AS DIRECTED (Patient taking differently: Take 20 mg by mouth daily as needed for fluid.) 30 tablet 1   gabapentin (NEURONTIN) 100 MG capsule TAKE 1 CAPSULE BY MOUTH AT BEDTIME. 90 capsule 0   isosorbide mononitrate (IMDUR) 30 MG 24 hr tablet Take 1 tablet (30 mg total) by mouth daily. 90 tablet 2   KLOR-CON M10 10 MEQ tablet TAKE 1 TABLET BY MOUTH EVERY DAY 90 tablet 1   Misc. Devices MISC Please provide with left upper extremity lymphedema sleeve 1 Units 0   nitroGLYCERIN (NITROSTAT) 0.4 MG SL tablet Place 1 tablet (0.4 mg total) under the tongue every 5 (five) minutes x 3 doses as needed. 25 tablet 3   Omega-3 Fatty Acids 300 MG CAPS Take 1 capsule (300 mg total) by mouth 2 (two) times daily. (Patient taking differently: Take 1 capsule by mouth daily.)     palbociclib (IBRANCE) 75 MG tablet Take 1 tablet (75 mg total) by mouth daily. Take for 21 days on, 7 days off, repeat every 28 days. Continue to hold until follow up with oncology service. 21 tablet 3   pantoprazole (PROTONIX) 40 MG tablet Take 1 tablet (40 mg total) by mouth daily. 90 tablet 1   prochlorperazine (COMPAZINE) 10 MG tablet Take 1 tablet (10 mg total) by mouth every 6 (six) hours as needed for nausea or vomiting. 60 tablet 4   rosuvastatin (CRESTOR) 40 MG tablet TAKE 1 TABLET BY MOUTH EVERY DAY 90 tablet 2   sulfamethoxazole-trimethoprim (BACTRIM DS) 800-160 MG tablet Take 1 tablet by mouth 2 (two) times daily. 10 tablet 0   SYNTHROID 125 MCG tablet TAKE 1 TABLET BY MOUTH EVERY DAY BEFORE BREAKFAST 30 tablet 3   terbinafine (LAMISIL) 1 % cream Apply 1 Application topically 2 (two) times daily. 42 g 0   traMADol (ULTRAM) 50 MG tablet Take 1 tablet (50 mg total) by mouth every 8 (eight) hours as needed. 90 tablet 0   No current facility-administered medications for this visit.      PHYSICAL EXAMINATION: Performance status (ECOG): 2 - Symptomatic, <50% confined to bed  Vitals:   02/24/22 1029  BP: (!) 152/66  Pulse: 65  Resp: 18  Temp: (!) 97.5 F (36.4 C)  SpO2: 96%   Wt Readings from Last 3 Encounters:  02/24/22 86.2 kg (190 lb)  01/23/22 87.5 kg (192 lb 12.8 oz)  12/26/21 89.9 kg (198 lb 3.2 oz)   Physical Exam Vitals reviewed. Exam conducted with a chaperone present.  Constitutional:      Appearance: Normal appearance.  Cardiovascular:     Rate and Rhythm: Normal rate and regular rhythm.     Pulses: Normal pulses.     Heart sounds: Normal heart sounds.  Pulmonary:     Effort: Pulmonary effort is normal.     Breath sounds: Normal breath sounds.  Abdominal:     Palpations: Abdomen is soft. There is no hepatomegaly, splenomegaly or mass.     Tenderness: There is no abdominal tenderness.  Lymphadenopathy:     Upper Body:     Right upper body: No supraclavicular, axillary or pectoral adenopathy.     Left upper body: No supraclavicular, axillary or pectoral adenopathy.  Neurological:     General: No focal deficit present.     Mental Status: She is alert and oriented to person, place, and time.  Psychiatric:        Mood and Affect: Mood normal.        Behavior: Behavior normal.       LABORATORY DATA:  I have reviewed the data as listed    Latest Ref Rng & Units 02/24/2022    9:45 AM 01/23/2022    1:48 PM 12/26/2021    9:15 AM  CMP  Glucose 70 - 99 mg/dL 97  93  96   BUN 8 - 23 mg/dL '18  22  16   '$ Creatinine 0.44 - 1.00 mg/dL 1.37  1.38  1.75   Sodium 135 - 145 mmol/L 136  137  137   Potassium 3.5 - 5.1 mmol/L 3.9  3.9  3.5   Chloride 98 - 111 mmol/L 108  106  111   CO2 22 - 32 mmol/L '18  21  20   '$ Calcium 8.9 - 10.3 mg/dL 8.8  9.2  8.8   Total Protein 6.5 - 8.1 g/dL 7.5  7.6  7.1   Total Bilirubin 0.3 - 1.2 mg/dL 1.0  0.9  0.4   Alkaline Phos 38 - 126 U/L 144  154  169   AST 15 - 41 U/L 54  55  32   ALT 0 - 44 U/L '12  19  12     '$ Lab Results  Component Value Date   CAN153 165.0 (H) 01/23/2022   CAN153 111.0 (H) 12/18/2021   CAN153 87.0 (H) 10/28/2021   Lab Results  Component Value Date   WBC 2.0 (L) 02/24/2022   HGB 10.9 (L) 02/24/2022   HCT 33.4 (L) 02/24/2022   MCV 90.0 02/24/2022   PLT 166 02/24/2022   NEUTROABS 1.3 (L) 02/24/2022   RADIOGRAPHIC STUDIES: I have personally reviewed the radiological images as listed and agreed with the findings in the report. NM Bone Scan Whole Body  Result Date: 02/13/2022 CLINICAL DATA:  Metastatic breast cancer EXAM: NUCLEAR MEDICINE WHOLE BODY BONE SCAN TECHNIQUE: Whole body anterior and posterior images were obtained approximately 3 hours after intravenous injection of radiopharmaceutical. RADIOPHARMACEUTICALS:  19.0 mCi Technetium-73mMDP IV COMPARISON:  Whole-body bone scan 07/02/2021. MRI's of the thoracolumbar spine 07/09/2021. PET-CT 05/15/2020. FINDINGS: Widespread osseous uptake has progressed, consistent with progressive multifocal metastatic disease. The progressive uptake is most prominent within the right humeral neck, sternum left femoral intertrochanteric region and throughout the right femoral shaft. The rib uptake has also mildly progressed. The uptake in the thoracolumbar spine and pelvis has not substantially changed. No acute or focal soft tissue abnormalities are identified. Previous right total knee arthroplasty noted. IMPRESSION: Progressive widespread osseous metastatic disease. Radiographic correlation should be considered for any symptomatic areas to exclude impending pathologic fracture (notably within the proximal right humerus and the left femoral intertrochanteric region. Electronically Signed   By: WRichardean SaleM.D.   On: 02/13/2022 09:27     ASSESSMENT:  1.  Recurrent breast carcinoma with metastases to bones: -History of right breast cancer in 1987,  status post right mastectomy requiring no adjuvant chemo or XRT.  Was not on  tamoxifen. -History of left breast cancer in 2007, status post mastectomy by Dr. Carlis Abbott at [facility], placed on tamoxifen, discontinued around 2015. -Recent upper back pain, weight loss of 20 pounds in the last 3 to 6 months. -CT angio on 04/04/2020 showed irregular mass in the left chest wall concerning for recurrent breast cancer measuring 4.5 x 3.1 cm. -Also showed lytic lesions in T1 and T3 vertebral bodies as well as manubrium. -Left chest wall mass biopsy on 04/05/2020 consistent with adenocarcinoma compatible with recurrent breast carcinoma.  ER 95% positive, PR 40% positive, HER2 2+, negative by FISH. - PET scan on 05/15/2020 shows hypermetabolic mass in the left anterior chest wall, mild hypermetabolic thoracic lymphadenopathy, diffuse bone metastasis throughout the spine, sternum, both shoulders, pelvis, proximal right femur which are both lytic and sclerotic. - Anastrozole started around 05/21/2020.  She never started palbociclib even though it was recommended at that time. - Bone scan (07/09/2021): Increased uptake over the right frontal bone, cervical, thoracic and lumbar spine.  Multifocal areas of increased activity noted throughout the sacrum and pelvis.  Increased activity noted over the sternum.  Increased activity noted over the proximal right humerus and over the lateral aspect of the left scapula.  Focal area of increased activity noted over the proximal right femur. - Took Ibrance 100 mg from 06/28/2021 through 07/04/2021, discontinued secondary to nervousness, nausea and vomiting. - Ibrance 75 mg daily from 09/25/2021 through 02/24/2022   2.  Left upper extremity DVT: -Left upper extremity Doppler on 04/04/2020 + for DVT involving left internal jugular vein, left subclavian vein, left axillary vein, left brachial and left basilic vein.   3.  Family history: - No major family history of malignancies other than her brother with throat cancer.  4.  Left kidney mass: - Last CT  scan showed 2.1 x 1.7 cm exophytic soft tissue lesion in the left lateral inferior pole of the left kidney.  It will be followed on subsequent scans.   PLAN:  1.  Metastatic breast cancer to the bones, ER/PR positive: - Bone scan (02/11/2022): Progressive widespread bone metastatic disease with new site in the left femur intertrochanteric region. - She has progression on the current regimen of Ibrance and anastrozole. - Recommend left hip x-ray to look for any impending fracture. - Tumor markers have also gotten worse. - Recommend CT CAP to see if there is any residual disease. - Recommend NGS testing and genetics. - If there is visceral crisis, will consider Enhertu or Sacituzumab. - Other options include fulvestrant with capivasertib. - RTC 3 weeks for follow-up.   2.  Left upper extremity DVT in the setting of malignancy: - Continue Eliquis 5 mg twice daily.  No bleeding issues.   3.  Right sternal/chest wall pain: - Continue tramadol 50 mg 3 times daily as needed.   4.  Bone metastasis: - Calcium today is 8.8.  Continue monthly denosumab.  5.  Normocytic anemia: - Last ferritin is 119 and percent saturation 21.  She will receive Venofer today.  Hemoglobin is 10.9.  Breast Cancer therapy associated bone loss: I have recommended calcium, Vitamin D and weight bearing exercises.  Orders placed this encounter:  No orders of the defined types were placed in this encounter.    The patient has a good understanding of the overall plan. She agrees with it. She will call with any problems that may develop  before the next visit here.    I,Alexis Herring,acting as a Education administrator for [employer], MD.,have documented all relevant documentation on the behalf of Derek Jack, MD,as directed by  Derek Jack, MD while in the presence of Derek Jack, MD.  I, Derek Jack MD, have reviewed the above documentation for accuracy and completeness, and I agree with  the above.   Derek Jack, MD [place] [facility] [phone]

## 2022-02-27 ENCOUNTER — Ambulatory Visit: Payer: Medicare PPO | Admitting: Internal Medicine

## 2022-02-28 DIAGNOSIS — C50912 Malignant neoplasm of unspecified site of left female breast: Secondary | ICD-10-CM | POA: Diagnosis not present

## 2022-02-28 DIAGNOSIS — C7989 Secondary malignant neoplasm of other specified sites: Secondary | ICD-10-CM | POA: Diagnosis not present

## 2022-03-04 ENCOUNTER — Ambulatory Visit (INDEPENDENT_AMBULATORY_CARE_PROVIDER_SITE_OTHER): Payer: Medicare PPO | Admitting: Internal Medicine

## 2022-03-04 ENCOUNTER — Ambulatory Visit: Payer: Medicare PPO | Admitting: Internal Medicine

## 2022-03-04 ENCOUNTER — Encounter: Payer: Self-pay | Admitting: Internal Medicine

## 2022-03-04 VITALS — BP 130/55 | HR 65 | Ht 66.0 in | Wt 188.6 lb

## 2022-03-04 DIAGNOSIS — I429 Cardiomyopathy, unspecified: Secondary | ICD-10-CM

## 2022-03-04 DIAGNOSIS — N1831 Chronic kidney disease, stage 3a: Secondary | ICD-10-CM

## 2022-03-04 DIAGNOSIS — E039 Hypothyroidism, unspecified: Secondary | ICD-10-CM | POA: Diagnosis not present

## 2022-03-04 DIAGNOSIS — R109 Unspecified abdominal pain: Secondary | ICD-10-CM | POA: Diagnosis not present

## 2022-03-04 DIAGNOSIS — I779 Disorder of arteries and arterioles, unspecified: Secondary | ICD-10-CM

## 2022-03-04 DIAGNOSIS — Z23 Encounter for immunization: Secondary | ICD-10-CM | POA: Diagnosis not present

## 2022-03-04 DIAGNOSIS — I10 Essential (primary) hypertension: Secondary | ICD-10-CM

## 2022-03-04 DIAGNOSIS — C50912 Malignant neoplasm of unspecified site of left female breast: Secondary | ICD-10-CM

## 2022-03-04 MED ORDER — DICYCLOMINE HCL 10 MG PO CAPS: 10.0000 mg | ORAL_CAPSULE | Freq: Three times a day (TID) | ORAL | 1 refills | Status: DC | PRN

## 2022-03-04 MED ORDER — FUROSEMIDE 20 MG PO TABS: 20.0000 mg | ORAL_TABLET | Freq: Every day | ORAL | 1 refills | Status: DC | PRN

## 2022-03-04 NOTE — Assessment & Plan Note (Signed)
CT chest showed recurrence of breast ca. On Anastrozole and Ibrance F/u with Heme/Onc.

## 2022-03-04 NOTE — Patient Instructions (Signed)
Please continue taking medications as prescribed.  Please continue to follow low salt diet and ambulate as tolerated.  Please consider getting Shingrix and Tdap vaccine at local pharmacy.

## 2022-03-04 NOTE — Assessment & Plan Note (Signed)
H/o probable Takotsubo cardiomyopathy with normalization of LVEF later on Follows up with Cardiologist On B-blocker, ARB and Lasix

## 2022-03-04 NOTE — Assessment & Plan Note (Signed)
Lab Results  Component Value Date   TSH 5.715 (H) 11/29/2021   On Levothyroxine 125 mcg QD Followed by endocrinology Recheck TSH and free T4

## 2022-03-04 NOTE — Progress Notes (Unsigned)
Established Patient Office Visit  Subjective:  Patient ID: Robin [name], female    DOB: [birth year]  Age: 82 y.o. MRN: [id number]  CC:  Chief Complaint  Patient presents with   Hypertension    Four month follow up for hypertension , CAD and CKD    HPI Robin [name] is a 82 y.o. female with past medical history of HTN, CAD s/p stent placement, carotid artery occlusion s/p carotid endarterectomy, hypothyroidism, generalized OA and GERD who presents for f/u of her chronic medical conditions.  She is on Ibrance and anastrozole for recurrence of breast cancer.  She has chronic UE lymphedema, but is stable currently.  Her blood tests showed stable CKD.  She is going to see Dr. Delton Coombes in the next month and will likely get repeat blood tests.  She denies any dysuria, hematuria, urinary hesitancy or resistance.  She has chronic leg swelling, but denies any recent worsening of dyspnea.  HTN and CAD: Her BP is well controlled today, but has been fluctuating at other office visits.  She is currently on amlodipine, Coreg, Imdur and Lasix.  She denies any chest pain, dyspnea or palpitations.  Past Medical History:  Diagnosis Date   Breast cancer ([place])    Tamoxifen started 2007   CAD (coronary artery disease)    DES second diagonal 2/12   Cardiomyopathy    Probable Takotsubo, LVEF 30-35% 2/12   Carotid artery disease (HCC)    Essential hypertension    Hepatic steatosis    Hyperlipidemia    Hypothyroidism    NSTEMI (non-ST elevated myocardial infarction) ([place])    2/12   Pancreas divisum    Stroke ([place])    (2000) residual left-sided weakness   Type 2 diabetes mellitus ([place])    reports not currently diabetic    Past Surgical History:  Procedure Laterality Date   ABDOMINAL HYSTERECTOMY     CAROTID ENDARTERECTOMY     Left   CHOLECYSTECTOMY     COLONOSCOPY WITH PROPOFOL N/A 05/03/2020   Procedure: COLONOSCOPY WITH PROPOFOL;  Surgeon: Daneil Dolin, MD;  [facility];  Service: Endoscopy;  Laterality: N/A;   ESOPHAGOGASTRODUODENOSCOPY (EGD) WITH PROPOFOL N/A 05/02/2020   Procedure: ESOPHAGOGASTRODUODENOSCOPY (EGD) WITH PROPOFOL;  Surgeon: Rogene Houston, MD;  [facility];  Service: Endoscopy;  Laterality: N/A;   GIVENS CAPSULE STUDY N/A 05/04/2020   Procedure: GIVENS CAPSULE STUDY;  Surgeon: Harvel Quale, MD;  [facility];  Service: Gastroenterology;  Laterality: N/A;   JOINT REPLACEMENT     [facility]   MASTECTOMY     Bilateral   POLYPECTOMY  05/03/2020   Procedure: POLYPECTOMY;  Surgeon: Daneil Dolin, MD;  [facility];  Service: Endoscopy;;    Family History  Problem Relation Age of Onset   Heart attack Sister    Heart disease Sister    Heart attack Brother    Heart disease Brother    Throat cancer Brother    Colon cancer Neg Hx     Social History   Socioeconomic History   Marital status: Widowed    Spouse name: Not on file   Number of children: Not on file   Years of education: Not on file   Highest education level: Not on file  Occupational History   Not on file  Tobacco Use   Smoking status: Never   Smokeless tobacco: Never  Vaping Use   Vaping Use:  Never used  Substance and Sexual Activity   Alcohol use: No    Alcohol/week: 0.0 standard drinks of alcohol   Drug use: No   Sexual activity: Not on file  Other Topics Concern   Not on file  Social History Narrative   Not on file   Social Determinants of Health   Financial Resource Strain: Low Risk  (12/11/2021)   Overall Financial Resource Strain (CARDIA)    Difficulty of Paying Living Expenses: Not hard at all  Food Insecurity: No Food Insecurity (11/29/2021)   Hunger Vital Sign    Worried About Running Out of Food in the Last Year: Never true    Ran Out of Food in the Last Year: Never true  Transportation Needs: No Transportation Needs (12/11/2021)   PRAPARE - Armed forces logistics/support/administrative officer (Medical): No    Lack of Transportation (Non-Medical): No  Physical Activity: Insufficiently Active (09/25/2021)   Exercise Vital Sign    Days of Exercise per Week: 3 days    Minutes of Exercise per Session: 30 min  Stress: No Stress Concern Present (09/22/2020)   [place]    Feeling of Stress : Not at all  Social Connections: Moderately Isolated (09/22/2020)   Social Connection and Isolation Panel [NHANES]    Frequency of Communication with Friends and Family: More than three times a week    Frequency of Social Gatherings with Friends and Family: More than three times a week    Attends Religious Services: More than 4 times per year    Active Member of [employer] or Organizations: No    Attends Archivist Meetings: Never    Marital Status: Widowed  Intimate Partner Violence: Not At Risk (11/29/2021)   Humiliation, Afraid, Rape, and Kick questionnaire    Fear of Current or Ex-Partner: No    Emotionally Abused: No    Physically Abused: No    Sexually Abused: No    Outpatient Medications Prior to Visit  Medication Sig Dispense Refill   acetaminophen (TYLENOL) 325 MG tablet Take 2 tablets (650 mg total) by mouth every 6 (six) hours as needed for mild pain, fever or headache (or Fever >/= 101). (Patient taking differently: Take 325 mg by mouth every 6 (six) hours as needed for mild pain, fever or headache (or Fever >/= 101).) 12 tablet 0   amLODipine (NORVASC) 10 MG tablet Take 1 tablet (10 mg total) by mouth daily. 90 tablet 3   anastrozole (ARIMIDEX) 1 MG tablet TAKE 1 TABLET BY MOUTH EVERY DAY 30 tablet 3   benzonatate (TESSALON) 100 MG capsule Take 1 capsule (100 mg total) by mouth 2 (two) times daily as needed for cough. 20 capsule 0   carvedilol (COREG) 12.5 MG tablet TAKE 1 TABLET (12.5MG TOTAL) BY MOUTH TWICE A DAY WITH MEALS (Patient taking differently: Take 12.5 mg by mouth 2 (two) times  daily with a meal.) 90 tablet 3   cetirizine (ZYRTEC) 10 MG tablet Take 1 tablet (10 mg total) by mouth daily. 30 tablet 0   Cholecalciferol (VITAMIN D3) 125 MCG (5000 UT) CAPS TAKE 1 CAPSULE BY MOUTH ONCE DAILY (Patient taking differently: Take 1 capsule by mouth daily.) 90 capsule 1   ELIQUIS 5 MG TABS tablet TAKE 1 TABLET BY MOUTH TWICE A DAY 60 tablet 3   ferrous sulfate 325 (65 FE) MG tablet Take 1 tablet (325 mg total) by mouth daily with breakfast. 30 tablet 0  fluticasone (FLONASE) 50 MCG/ACT nasal spray Place 2 sprays into both nostrils daily. 16 g 1   gabapentin (NEURONTIN) 100 MG capsule TAKE 1 CAPSULE BY MOUTH AT BEDTIME. 90 capsule 0   isosorbide mononitrate (IMDUR) 30 MG 24 hr tablet Take 1 tablet (30 mg total) by mouth daily. 90 tablet 2   KLOR-CON M10 10 MEQ tablet TAKE 1 TABLET BY MOUTH EVERY DAY 90 tablet 1   Misc. Devices MISC Please provide with left upper extremity lymphedema sleeve 1 Units 0   nitroGLYCERIN (NITROSTAT) 0.4 MG SL tablet Place 1 tablet (0.4 mg total) under the tongue every 5 (five) minutes x 3 doses as needed. 25 tablet 3   Omega-3 Fatty Acids 300 MG CAPS Take 1 capsule (300 mg total) by mouth 2 (two) times daily. (Patient taking differently: Take 1 capsule by mouth daily.)     palbociclib (IBRANCE) 75 MG tablet Take 1 tablet (75 mg total) by mouth daily. Take for 21 days on, 7 days off, repeat every 28 days. Continue to hold until follow up with oncology service. 21 tablet 3   pantoprazole (PROTONIX) 40 MG tablet Take 1 tablet (40 mg total) by mouth daily. 90 tablet 1   prochlorperazine (COMPAZINE) 10 MG tablet Take 1 tablet (10 mg total) by mouth every 6 (six) hours as needed for nausea or vomiting. 60 tablet 4   rosuvastatin (CRESTOR) 40 MG tablet TAKE 1 TABLET BY MOUTH EVERY DAY 90 tablet 2   sulfamethoxazole-trimethoprim (BACTRIM DS) 800-160 MG tablet Take 1 tablet by mouth 2 (two) times daily. 10 tablet 0   SYNTHROID 125 MCG tablet TAKE 1 TABLET BY MOUTH  EVERY DAY BEFORE BREAKFAST 30 tablet 3   terbinafine (LAMISIL) 1 % cream Apply 1 Application topically 2 (two) times daily. 42 g 0   traMADol (ULTRAM) 50 MG tablet Take 1 tablet (50 mg total) by mouth every 8 (eight) hours as needed. 90 tablet 0   dicyclomine (BENTYL) 10 MG capsule Take 1 capsule (10 mg total) by mouth 3 (three) times daily as needed for spasms. 20 capsule 0   fluconazole (DIFLUCAN) 150 MG tablet Take 150 mg by mouth once.     furosemide (LASIX) 20 MG tablet TAKE 1 TABLET BY MOUTH EVERY DAY AS NEEDED AS DIRECTED (Patient taking differently: Take 20 mg by mouth daily as needed for fluid.) 30 tablet 1   No facility-administered medications prior to visit.    Allergies  Allergen Reactions   Codeine Nausea And Vomiting    ROS Review of Systems  Constitutional:  Negative for chills and fever.  HENT:  Negative for congestion, sinus pressure, sinus pain and sore throat.   Eyes:  Negative for pain and discharge.  Respiratory:  Negative for cough and shortness of breath.   Cardiovascular:  Positive for leg swelling. Negative for chest pain and palpitations.  Gastrointestinal:  Negative for abdominal pain, diarrhea, nausea and vomiting.  Endocrine: Negative for polydipsia and polyuria.  Genitourinary:  Negative for dysuria and hematuria.  Musculoskeletal:  Negative for neck pain and neck stiffness.  Skin:  Negative for rash.  Neurological:  Positive for weakness. Negative for dizziness.  Psychiatric/Behavioral:  Negative for agitation and behavioral problems.       Objective:    Physical Exam Vitals reviewed.  Constitutional:      General: She is not in acute distress.    Appearance: She is obese. She is not diaphoretic.     Comments: In wheelchair  HENT:  Head: Normocephalic and atraumatic.     Nose: Nose normal.     Mouth/Throat:     Mouth: Mucous membranes are moist.  Eyes:     General: No scleral icterus.    Extraocular Movements: Extraocular movements  intact.  Cardiovascular:     Rate and Rhythm: Normal rate and regular rhythm.     Pulses: Normal pulses.     Heart sounds: Murmur (Systolic, lower right upper sternal border) heard.  Pulmonary:     Breath sounds: Normal breath sounds. No wheezing or rales.  Abdominal:     Palpations: Abdomen is soft.     Tenderness: There is no abdominal tenderness.  Musculoskeletal:     Cervical back: Neck supple. No tenderness.     Right lower leg: Edema (1+) present.     Left lower leg: Edema (1+) present.     Comments: B/l UE swelling  Skin:    General: Skin is warm.     Findings: No rash.  Neurological:     General: No focal deficit present.     Mental Status: She is alert and oriented to person, place, and time.  Psychiatric:        Mood and Affect: Mood normal.        Behavior: Behavior normal.     BP (!) 130/55 (BP Location: Right Arm, Patient Position: Sitting, Cuff Size: Large)   Pulse 65   Ht 5' 6"$  (1.676 m)   Wt 188 lb 9.6 oz (85.5 kg)   SpO2 92%   BMI 30.44 kg/m  Wt Readings from Last 3 Encounters:  03/04/22 188 lb 9.6 oz (85.5 kg)  02/24/22 190 lb (86.2 kg)  01/23/22 192 lb 12.8 oz (87.5 kg)    Lab Results  Component Value Date   TSH 5.715 (H) 11/29/2021   Lab Results  Component Value Date   WBC 2.0 (L) 02/24/2022   HGB 10.9 (L) 02/24/2022   HCT 33.4 (L) 02/24/2022   MCV 90.0 02/24/2022   PLT 166 02/24/2022   Lab Results  Component Value Date   NA 136 02/24/2022   K 3.9 02/24/2022   CO2 18 (L) 02/24/2022   GLUCOSE 97 02/24/2022   BUN 18 02/24/2022   CREATININE 1.37 (H) 02/24/2022   BILITOT 1.0 02/24/2022   ALKPHOS 144 (H) 02/24/2022   AST 54 (H) 02/24/2022   ALT 12 02/24/2022   PROT 7.5 02/24/2022   ALBUMIN 4.0 02/24/2022   CALCIUM 8.8 (L) 02/24/2022   ANIONGAP 10 02/24/2022   EGFR 31 (L) 04/11/2020   Lab Results  Component Value Date   CHOL 145 03/01/2020   Lab Results  Component Value Date   HDL 46 (L) 03/01/2020   Lab Results  Component  Value Date   LDLCALC 80 03/01/2020   Lab Results  Component Value Date   TRIG 109 03/01/2020   Lab Results  Component Value Date   CHOLHDL 3.2 03/01/2020   Lab Results  Component Value Date   HGBA1C 5.9 (H) 03/01/2020      Assessment & Plan:   Problem List Items Addressed This Visit       Cardiovascular and Mediastinum   Secondary cardiomyopathy ([place])    H/o probable Takotsubo cardiomyopathy with normalization of LVEF later on Follows up with Cardiologist On B-blocker, ARB and Lasix      Relevant Medications   furosemide (LASIX) 20 MG tablet   Essential hypertension, benign - Primary    BP Readings from Last 1 Encounters:  03/04/22 (!) 130/55  Well-controlled with amlodipine, Coreg, Imdur and Lasix (PRN) Counseled for compliance with the medications Advised DASH diet and walking as tolerated      Relevant Medications   furosemide (LASIX) 20 MG tablet   Carotid artery disease (HCC)    S/p left carotid endarterectomy On DAPT and statin      Relevant Medications   furosemide (LASIX) 20 MG tablet     Endocrine   Hypothyroidism    Lab Results  Component Value Date   TSH 5.715 (H) 11/29/2021  On Levothyroxine 125 mcg QD Followed by endocrinology Recheck TSH and free T4      Relevant Orders   TSH + free T4     Genitourinary   Stage 3a chronic kidney disease ([place])    Last BMP reviewed, stable GFR Avoid nephrotoxic agents On Lasix for LE edema        Other   Recurrent malignant neoplasm of left breast ([place])    CT chest showed recurrence of breast ca. On Anastrozole and Ibrance F/u with Heme/Onc.      Abdominal cramping    Takes Bentyl PRN for abdominal cramps, refilled      Relevant Medications   dicyclomine (BENTYL) 10 MG capsule   Other Visit Diagnoses     Need for pneumococcal 20-valent conjugate vaccination       Relevant Orders   Pneumococcal conjugate vaccine 20-valent (Completed)      Meds ordered this encounter  Medications    dicyclomine (BENTYL) 10 MG capsule    Sig: Take 1 capsule (10 mg total) by mouth 3 (three) times daily as needed for spasms.    Dispense:  20 capsule    Refill:  1   furosemide (LASIX) 20 MG tablet    Sig: Take 1 tablet (20 mg total) by mouth daily as needed for fluid or edema.    Dispense:  30 tablet    Refill:  1    Follow-up: Return in about 4 months (around 07/03/2022) for Annual physical.    [name], MD

## 2022-03-04 NOTE — Assessment & Plan Note (Signed)
Last BMP reviewed, stable GFR Avoid nephrotoxic agents On Lasix for LE edema

## 2022-03-04 NOTE — Assessment & Plan Note (Addendum)
BP Readings from Last 1 Encounters:  03/04/22 (!) 130/55   Well-controlled with amlodipine, Coreg, Imdur and Lasix (PRN) Counseled for compliance with the medications Advised DASH diet and walking as tolerated

## 2022-03-04 NOTE — Assessment & Plan Note (Signed)
S/p left carotid endarterectomy On DAPT and statin

## 2022-03-05 ENCOUNTER — Encounter: Payer: Self-pay | Admitting: Licensed Clinical Social Worker

## 2022-03-05 DIAGNOSIS — Z1379 Encounter for other screening for genetic and chromosomal anomalies: Secondary | ICD-10-CM | POA: Insufficient documentation

## 2022-03-06 ENCOUNTER — Ambulatory Visit: Payer: Medicare PPO | Admitting: Internal Medicine

## 2022-03-06 DIAGNOSIS — R109 Unspecified abdominal pain: Secondary | ICD-10-CM | POA: Insufficient documentation

## 2022-03-06 NOTE — Assessment & Plan Note (Signed)
Takes Bentyl PRN for abdominal cramps, refilled

## 2022-03-10 ENCOUNTER — Ambulatory Visit (HOSPITAL_COMMUNITY)
Admission: RE | Admit: 2022-03-10 | Discharge: 2022-03-10 | Disposition: A | Discharge status: Home / Self Care | Payer: Medicare PPO | Source: Ambulatory Visit | Attending: Hematology | Admitting: Hematology

## 2022-03-10 ENCOUNTER — Other Ambulatory Visit: Payer: Self-pay | Admitting: Hematology

## 2022-03-10 DIAGNOSIS — Z853 Personal history of malignant neoplasm of breast: Secondary | ICD-10-CM | POA: Diagnosis not present

## 2022-03-10 DIAGNOSIS — M47816 Spondylosis without myelopathy or radiculopathy, lumbar region: Secondary | ICD-10-CM | POA: Diagnosis not present

## 2022-03-10 DIAGNOSIS — C7951 Secondary malignant neoplasm of bone: Secondary | ICD-10-CM

## 2022-03-10 DIAGNOSIS — M85852 Other specified disorders of bone density and structure, left thigh: Secondary | ICD-10-CM | POA: Diagnosis not present

## 2022-03-10 DIAGNOSIS — N289 Disorder of kidney and ureter, unspecified: Secondary | ICD-10-CM | POA: Diagnosis not present

## 2022-03-10 DIAGNOSIS — K5730 Diverticulosis of large intestine without perforation or abscess without bleeding: Secondary | ICD-10-CM | POA: Diagnosis not present

## 2022-03-10 DIAGNOSIS — J90 Pleural effusion, not elsewhere classified: Secondary | ICD-10-CM | POA: Diagnosis not present

## 2022-03-10 DIAGNOSIS — R918 Other nonspecific abnormal finding of lung field: Secondary | ICD-10-CM | POA: Diagnosis not present

## 2022-03-10 DIAGNOSIS — C50912 Malignant neoplasm of unspecified site of left female breast: Secondary | ICD-10-CM | POA: Diagnosis not present

## 2022-03-10 MED ORDER — IOHEXOL 300 MG/ML  SOLN
100.0000 mL | Freq: Once | INTRAMUSCULAR | Status: AC | PRN
  Administered 2022-03-10: 100 mL via INTRAVENOUS

## 2022-03-11 ENCOUNTER — Other Ambulatory Visit: Payer: Self-pay | Admitting: Hematology

## 2022-03-11 ENCOUNTER — Other Ambulatory Visit: Payer: Self-pay | Admitting: Internal Medicine

## 2022-03-14 ENCOUNTER — Ambulatory Visit: Payer: Self-pay | Admitting: Licensed Clinical Social Worker

## 2022-03-14 ENCOUNTER — Telehealth: Payer: Self-pay | Admitting: Licensed Clinical Social Worker

## 2022-03-14 ENCOUNTER — Encounter: Payer: Self-pay | Admitting: Licensed Clinical Social Worker

## 2022-03-14 DIAGNOSIS — Z853 Personal history of malignant neoplasm of breast: Secondary | ICD-10-CM

## 2022-03-14 DIAGNOSIS — C50912 Malignant neoplasm of unspecified site of left female breast: Secondary | ICD-10-CM

## 2022-03-14 DIAGNOSIS — Z1379 Encounter for other screening for genetic and chromosomal anomalies: Secondary | ICD-10-CM

## 2022-03-14 NOTE — Progress Notes (Signed)
REFERRING PROVIDER: Dr. Delton Coombes  PRIMARY PROVIDER:  [name], MD  PRIMARY REASON FOR VISIT:  1. Recurrent malignant neoplasm of left breast ([place])   2. Genetic testing   3. History of cancer of right breast      HISTORY OF PRESENT ILLNESS:   Robin [name], a 82 y.o. female, was seen for a [place] cancer genetics consultation at the request of Dr. No ref. provider found due to a personal history of breast cancer.  Robin [name] presents to clinic today to discuss the possibility of a hereditary predisposition to cancer, genetic testing, and to further clarify her future cancer risks, as well as potential cancer risks for family members.   CANCER HISTORY:  In 61, at the age of 14, Robin [name] was diagnosed with cancer of the right breast. In 2007, at the age of 79, she was diagnosed with left breast cancer. She now has recurrent metastatic breast cancer.    Past Medical History:  Diagnosis Date   Breast cancer ([place])    Tamoxifen started 2007   CAD (coronary artery disease)    DES second diagonal 2/12   Cardiomyopathy    Probable Takotsubo, LVEF 30-35% 2/12   Carotid artery disease (HCC)    Essential hypertension    Hepatic steatosis    Hyperlipidemia    Hypothyroidism    NSTEMI (non-ST elevated myocardial infarction) ([place])    2/12   Pancreas divisum    Stroke ([place])    (2000) residual left-sided weakness   Type 2 diabetes mellitus ([place])    reports not currently diabetic    Past Surgical History:  Procedure Laterality Date   ABDOMINAL HYSTERECTOMY     CAROTID ENDARTERECTOMY     Left   CHOLECYSTECTOMY     COLONOSCOPY WITH PROPOFOL N/A 05/03/2020   Procedure: COLONOSCOPY WITH PROPOFOL;  Surgeon: Daneil Dolin, MD;  [facility];  Service: Endoscopy;  Laterality: N/A;   ESOPHAGOGASTRODUODENOSCOPY (EGD) WITH PROPOFOL N/A 05/02/2020   Procedure: ESOPHAGOGASTRODUODENOSCOPY (EGD) WITH PROPOFOL;  Surgeon: Rogene Houston, MD;  [facility];  Service: Endoscopy;  Laterality: N/A;   GIVENS CAPSULE STUDY N/A 05/04/2020   Procedure: GIVENS CAPSULE STUDY;  Surgeon: Harvel Quale, MD;  [facility];  Service: Gastroenterology;  Laterality: N/A;   JOINT REPLACEMENT     [facility]   MASTECTOMY     Bilateral   POLYPECTOMY  05/03/2020   Procedure: POLYPECTOMY;  Surgeon: Daneil Dolin, MD;  [facility];  Service: Endoscopy;;    FAMILY HISTORY:  We obtained a detailed, 4-generation family history.  Significant diagnoses are listed below: Family History  Problem Relation Age of Onset   Heart attack Sister    Heart disease Sister    Heart attack Brother    Heart disease Brother    Throat cancer Brother    Colon cancer Neg Hx    Robin [name] has 2 sons and 4 daughters, no cancers. She had 8 brothers and 4 sisters. One brother died of throat cancer in his 36s.  No other known cancers on either side of the family.  Robin [name] is unaware of previous family history of genetic testing for hereditary cancer risks. There is no reported Ashkenazi Jewish ancestry. There is no known consanguinity.    GENETIC COUNSELING ASSESSMENT: Robin [name] is a 82 y.o. female with a personal history of breast cancer which is somewhat suggestive of a hereditary  cancer syndrome and predisposition to cancer. We, therefore, discussed and recommended the following at today's visit.   DISCUSSION: We discussed that approximately 10% of breast cancer is hereditary. Most cases of hereditary breast cancer are associated with BRCA1/BRCA2 genes, although there are other genes associated with hereditary cancer as well. Cancers and risks are gene specific. We discussed that testing is beneficial for several reasons including knowing about cancer risks, identifying potential screening and risk-reduction options that may be appropriate, and to understand if other family members could be at  risk for cancer and allow them to undergo genetic testing.   We reviewed the characteristics, features and inheritance patterns of hereditary cancer syndromes. We also discussed genetic testing, including the appropriate family members to test, the process of testing, insurance coverage and turn-around-time for results. We discussed the implications of a negative, positive and/or variant of uncertain significant result. We recommended Robin [name] pursue genetic testing for the [facility] Common Hereditary Cancers+RNA gene panel which she had drawn 02/24/2022.  GENETIC TEST RESULTS:  The [facility] Common Hereditary Cancers+RNA Panel found no pathogenic mutations.   The Common Hereditary Cancers Panel + RNA offered by [facility] includes sequencing and/or deletion duplication testing of the following 48 genes: APC*, ATM*, AXIN2, BAP1, BARD1, BMPR1A, BRCA1, BRCA2, BRIP1, CDH1, CDK4, CDKN2A (p14ARF), CDKN2A (p16INK4a), CHEK2, CTNNA1, DICER1*, EPCAM*, FH*, GREM1*, HOXB13, KIT, MBD4, MEN1*, MLH1*, MSH2*, MSH3*, MSH6*, MUTYH, NF1*, NTHL1, PALB2, PDGFRA, PMS2*, POLD1*, POLE, PTEN*, RAD51C, RAD51D, SDHA*, SDHB, SDHC*, SDHD, SMAD4, SMARCA4, STK11, TP53, TSC1*, TSC2, VHL  The test report has been scanned into EPIC and is located under the Molecular Pathology section of the Results Review tab.  A portion of the result report is included below for reference. Genetic testing reported out on 03/13/2022.     Even though a pathogenic variant was not identified, possible explanations for the cancer in the family may include: There may be no hereditary risk for cancer in the family. The cancers in Robin [name] and/or her family may be sporadic/familial or due to other genetic and environmental factors. There may be a gene mutation in one of these genes that current testing methods cannot detect but that chance is small. There could be another gene that has not yet been discovered, or that we have not yet tested, that is  responsible for the cancer diagnoses in the family.  It is also possible there is a hereditary cause for the cancer in the family that Robin [name] did not inherit.  Therefore, it is important to remain in touch with cancer genetics in the future so that we can continue to offer Robin [name] the most up to date genetic testing.   ADDITIONAL GENETIC TESTING:  We discussed with Robin [name] that her genetic testing was fairly extensive.  If there are additional relevant genes identified to increase cancer risk that can be analyzed in the future, we would be happy to discuss and coordinate this testing at that time.    CANCER SCREENING RECOMMENDATIONS:  Robin [name] test result is considered negative (normal).  This means that we have not identified a hereditary cause for her personal and family history of cancer at this time.   An individual's cancer risk and medical management are not determined by genetic test results alone. Overall cancer risk assessment incorporates additional factors, including personal medical history, family history, and any available genetic information that may result in a personalized plan for cancer prevention and surveillance. Therefore, it is recommended she continue to follow the  cancer management and screening guidelines provided by her oncology and primary healthcare provider.  RECOMMENDATIONS FOR FAMILY MEMBERS:   Since she did not inherit a identifiable mutation in a cancer predisposition gene included on this panel, her children could not have inherited a known mutation from her in one of these genes. Individuals in this family might be at some increased risk of developing cancer, over the general population risk, due to the family history of cancer.  Individuals in the family should notify their providers of the family history of cancer. We recommend women in this family have a yearly mammogram beginning at age 8, or 50 years younger than the earliest onset of  cancer, an annual clinical breast exam, and perform monthly breast self-exams.  Family members should have colonoscopies by at age 58, or earlier, as recommended by their providers.   FOLLOW-UP:  Lastly, we discussed with Robin [name] that cancer genetics is a rapidly advancing field and it is possible that new genetic tests will be appropriate for her and/or her family members in the future. We encouraged her to remain in contact with cancer genetics on an annual basis so we can update her personal and family histories and let her know of advances in cancer genetics that may benefit this family.   Our contact number was provided. Robin [name] questions were answered to her satisfaction, and she knows she is welcome to call us at anytime with additional questions or concerns.    Faith Rogue, MS, [facility] Genetic Counselor [email or web address] Phone: [phone]

## 2022-03-14 NOTE — Telephone Encounter (Signed)
I contacted Ms. Balkema to discuss her genetic testing results. No pathogenic variants were identified in the 48 genes analyzed. Detailed clinic note to follow.   The test report has been scanned into EPIC and is located under the Molecular Pathology section of the Results Review tab.  A portion of the result report is included below for reference.      Faith Rogue, MS, [facility] Genetic Counselor [email or web address] Phone: [phone]

## 2022-03-17 ENCOUNTER — Other Ambulatory Visit: Payer: Self-pay | Admitting: Internal Medicine

## 2022-03-17 DIAGNOSIS — M4722 Other spondylosis with radiculopathy, cervical region: Secondary | ICD-10-CM

## 2022-03-17 MED ORDER — GABAPENTIN 100 MG PO CAPS: 100.0000 mg | ORAL_CAPSULE | Freq: Every day | ORAL | 1 refills | Status: DC

## 2022-03-19 DIAGNOSIS — C7989 Secondary malignant neoplasm of other specified sites: Secondary | ICD-10-CM | POA: Diagnosis not present

## 2022-03-19 DIAGNOSIS — C50912 Malignant neoplasm of unspecified site of left female breast: Secondary | ICD-10-CM | POA: Diagnosis not present

## 2022-03-24 NOTE — Progress Notes (Signed)
[facility] [address] [place] [address]    Clinic Day:  03/25/2022  Referring physician: [name], MD  Patient Care Team: [name], MD as PCP - General (Internal Medicine) Satira Sark, MD as PCP - Cardiology (Cardiology) Brien Mates, RN as Oncology Nurse Navigator (Oncology) Derek Jack, MD as Medical Oncologist (Medical Oncology)   ASSESSMENT & PLAN:   Assessment: 1.  Recurrent breast carcinoma with metastases to bones: -History of right breast cancer in 1987, status post right mastectomy requiring no adjuvant chemo or XRT.  Was not on tamoxifen. -History of left breast cancer in 2007, status post mastectomy by Dr. Carlis Abbott at [facility], placed on tamoxifen, discontinued around 2015. -Recent upper back pain, weight loss of 20 pounds in the last 3 to 6 months. -CT angio on 04/04/2020 showed irregular mass in the left chest wall concerning for recurrent breast cancer measuring 4.5 x 3.1 cm. -Also showed lytic lesions in T1 and T3 vertebral bodies as well as manubrium. -Left chest wall mass biopsy on 04/05/2020 consistent with adenocarcinoma compatible with recurrent breast carcinoma.  ER 95% positive, PR 40% positive, HER2 2+, negative by FISH. - PET scan on 05/15/2020 shows hypermetabolic mass in the left anterior chest wall, mild hypermetabolic thoracic lymphadenopathy, diffuse bone metastasis throughout the spine, sternum, both shoulders, pelvis, proximal right femur which are both lytic and sclerotic. - Anastrozole started around 05/21/2020.  She never started palbociclib even though it was recommended at that time. - Bone scan (07/09/2021): Increased uptake over the right frontal bone, cervical, thoracic and lumbar spine.  Multifocal areas of increased activity noted throughout the sacrum and pelvis.  Increased activity noted over the sternum.  Increased activity noted over the proximal right humerus and over the lateral  aspect of the left scapula.  Focal area of increased activity noted over the proximal right femur. - Took Ibrance 100 mg from 06/28/2021 through 07/04/2021, discontinued secondary to nervousness, nausea and vomiting. - Ibrance 75 mg daily started on 09/25/2021, discontinued on 03/25/2022 due to progression.   2.  Left upper extremity DVT: -Left upper extremity Doppler on 04/04/2020 + for DVT involving left internal jugular vein, left subclavian vein, left axillary vein, left brachial and left basilic vein.   3.  Family history: - No major family history of malignancies other than her brother with throat cancer.   4.  Left kidney mass: - Last CT scan showed 2.1 x 1.7 cm exophytic soft tissue lesion in the left lateral inferior pole of the left kidney.  It will be followed on subsequent scans.  Plan:  1.  Metastatic breast cancer to the bones, ER/PR positive: - I have reviewed the CT CAP (03/10/2022): Infiltrative lung mass in the right upper lobe measuring 3.1 x 1.7 cm suspicious for lymphangitic spread of the tumor with other numerous small pulmonary masses.  No abdominal extraskeletal metastatic disease. - I have reviewed left hip x-ray which did not show any acute fracture or impending fracture lesion.  Innumerable scattered sclerotic lesions. - Recommended change in treatment at this time.  Discontinue Ibrance and anastrozole. - Because of residual lung metastatic disease, I have recommended Enhertu.  She has HER2 low disease with HER2 2+ on prior biopsy. - We have discussed side effects including rare chance of CHF and interstitial lung disease.  We also discussed about common side effects including diarrhea/nausea and fatigue and hair loss. - She had echocardiogram done on 12/01/2021 which  showed EF 55 to 60%. - Recommend port placement by IR. - Will start Enhertu after port placement.  2.  Left upper extremity DVT in the setting of malignancy: - Continue Eliquis 5 mg twice daily.  No bleeding  issues.  3.  Bone metastasis: - Will hold her denosumab today as there was no BMP done.  Will resume it on first day of chemo.  4.  Normocytic anemia: - CBC today shows hemoglobin improved to 10.6.  Continue parenteral iron as needed.  5.  Loss of appetite: - Reported complete loss of appetite.  Will start her on Marinol 5 mg twice daily.  6.  Hypothyroidism: - She is taking Synthroid 125 mcg daily.  TSH today is 11.5.  Free T4 is 0.9. - Will increase Synthroid to 150 mcg daily.   Orders Placed This Encounter  Procedures   IR IMAGING GUIDED PORT INSERTION    Standing Status:   Future    Standing Expiration Date:   03/25/2023    Order Specific Question:   Reason for Exam (SYMPTOM  OR DIAGNOSIS REQUIRED)    Answer:   chemotherapy administration    Order Specific Question:   Preferred Imaging Location?    Answer:   [facility]   CBC with Differential    Standing Status:   Future    Standing Expiration Date:   04/10/2023   Comprehensive metabolic panel    Standing Status:   Future    Standing Expiration Date:   04/10/2023   CBC with Differential    Standing Status:   Future    Standing Expiration Date:   05/01/2023   Comprehensive metabolic panel    Standing Status:   Future    Standing Expiration Date:   05/01/2023   CBC with Differential    Standing Status:   Future    Standing Expiration Date:   05/22/2023   Comprehensive metabolic panel    Standing Status:   Future    Standing Expiration Date:   05/22/2023   CBC with Differential    Standing Status:   Future    Standing Expiration Date:   06/12/2023   Comprehensive metabolic panel    Standing Status:   Future    Standing Expiration Date:   06/12/2023   CBC with Differential    Standing Status:   Future    Standing Expiration Date:   07/03/2023   Comprehensive metabolic panel    Standing Status:   Future    Standing Expiration Date:   07/03/2023      I,Alexis Herring,acting as a scribe for Derek Jack,  MD.,have documented all relevant documentation on the behalf of Derek Jack, MD,as directed by  Derek Jack, MD while in the presence of Derek Jack, MD.   I, Derek Jack MD, have reviewed the above documentation for accuracy and completeness, and I agree with the above.   Derek Jack, MD   3/5/20246:30 PM  CHIEF COMPLAINT:   Diagnosis:  left breast cancer with metastases to bones    Cancer Staging  No matching staging information was found for the patient.   Prior Therapy: Bilateral mastectomies   Current Therapy:  Anastrozole started around 05/21/2020, Ibrance    HISTORY OF PRESENT ILLNESS:   Oncology History  Malignant neoplasm of left breast in female, estrogen receptor positive ([place])  04/05/2020 Initial Diagnosis   Malignant neoplasm of left breast in female, estrogen receptor positive ([place])   04/10/2022 -  Chemotherapy   Patient is  on Treatment Plan : BREAST METASTATIC Fam-Trastuzumab Deruxtecan-nxki (Enhertu) (5.4) q21d        INTERVAL HISTORY:   Skyelyn is a 82 y.o. female presenting to clinic today for follow up of  left breast cancer with metastases to bones. She was last seen by me on 02/24/22.  Today, she states that she is doing well overall. Her appetite level is at 0% %. Her energy level is reported as low. She denies any new pains.  She reports knee pains at nighttime from arthritis.  Reports loss of appetite since last visit.  She reports that she has been taking thyroid medicine as prescribed daily.  Denies any recent respiratory infections like pneumonia.   PAST MEDICAL HISTORY:   Past Medical History: Past Medical History:  Diagnosis Date   Breast cancer ([place])    Tamoxifen started 2007   CAD (coronary artery disease)    DES second diagonal 2/12   Cardiomyopathy    Probable Takotsubo, LVEF 30-35% 2/12   Carotid artery disease ([facility])    Essential hypertension    Hepatic steatosis    Hyperlipidemia     Hypothyroidism    NSTEMI (non-ST elevated myocardial infarction) ([place])    2/12   Pancreas divisum    Stroke ([place])    (2000) residual left-sided weakness   Type 2 diabetes mellitus ([place])    reports not currently diabetic    Surgical History: Past Surgical History:  Procedure Laterality Date   ABDOMINAL HYSTERECTOMY     CAROTID ENDARTERECTOMY     Left   CHOLECYSTECTOMY     COLONOSCOPY WITH PROPOFOL N/A 05/03/2020   Procedure: COLONOSCOPY WITH PROPOFOL;  Surgeon: Daneil Dolin, MD;  [facility];  Service: Endoscopy;  Laterality: N/A;   ESOPHAGOGASTRODUODENOSCOPY (EGD) WITH PROPOFOL N/A 05/02/2020   Procedure: ESOPHAGOGASTRODUODENOSCOPY (EGD) WITH PROPOFOL;  Surgeon: Rogene Houston, MD;  [facility];  Service: Endoscopy;  Laterality: N/A;   GIVENS CAPSULE STUDY N/A 05/04/2020   Procedure: GIVENS CAPSULE STUDY;  Surgeon: Harvel Quale, MD;  [facility]: [facility];  Service: Gastroenterology;  Laterality: N/A;   JOINT REPLACEMENT     Right knee replacement- [facility]   MASTECTOMY     Bilateral   POLYPECTOMY  05/03/2020   Procedure: POLYPECTOMY;  Surgeon: Daneil Dolin, MD;  [facility]: [facility];  Service: Endoscopy;;    Social History: Social History   Socioeconomic History   Marital status: Widowed    Spouse name: Not on file   Number of children: Not on file   Years of education: Not on file   Highest education level: Not on file  Occupational History   Not on file  Tobacco Use   Smoking status: Never   Smokeless tobacco: Never  Vaping Use   Vaping Use: Never used  Substance and Sexual Activity   Alcohol use: No    Alcohol/week: 0.0 standard drinks of alcohol   Drug use: No   Sexual activity: Not on file  Other Topics Concern   Not on file  Social History Narrative   Not on file   Social Determinants of Health   Financial Resource Strain: Low Risk  (12/11/2021)   Overall Financial Resource Strain (CARDIA)     Difficulty of Paying Living Expenses: Not hard at all  Food Insecurity: No Food Insecurity (11/29/2021)   Hunger Vital Sign    Worried About Running Out of Food in the Last Year: Never true  Ran Out of Food in the Last Year: Never true  Transportation Needs: No Transportation Needs (12/11/2021)   PRAPARE - Hydrologist (Medical): No    Lack of Transportation (Non-Medical): No  Physical Activity: Insufficiently Active (09/25/2021)   Exercise Vital Sign    Days of Exercise per Week: 3 days    Minutes of Exercise per Session: 30 min  Stress: No Stress Concern Present (09/22/2020)   [place]    Feeling of Stress : Not at all  Social Connections: Moderately Isolated (09/22/2020)   Social Connection and Isolation Panel [NHANES]    Frequency of Communication with Friends and Family: More than three times a week    Frequency of Social Gatherings with Friends and Family: More than three times a week    Attends Religious Services: More than 4 times per year    Active Member of [employer] or Organizations: No    Attends Archivist Meetings: Never    Marital Status: Widowed  Intimate Partner Violence: Not At Risk (11/29/2021)   Humiliation, Afraid, Rape, and Kick questionnaire    Fear of Current or Ex-Partner: No    Emotionally Abused: No    Physically Abused: No    Sexually Abused: No    Family History: Family History  Problem Relation Age of Onset   Heart attack Sister    Heart disease Sister    Heart attack Brother    Heart disease Brother    Throat cancer Brother    Colon cancer Neg Hx     Current Medications:  Current Outpatient Medications:    acetaminophen (TYLENOL) 325 MG tablet, Take 2 tablets (650 mg total) by mouth every 6 (six) hours as needed for mild pain, fever or headache (or Fever >/= 101). (Patient taking differently: Take 325 mg by mouth every 6 (six) hours as  needed for mild pain, fever or headache (or Fever >/= 101).), Disp: 12 tablet, Rfl: 0   amLODipine (NORVASC) 10 MG tablet, TAKE 1 TABLET BY MOUTH EVERY DAY, Disp: 90 tablet, Rfl: 3   anastrozole (ARIMIDEX) 1 MG tablet, TAKE 1 TABLET BY MOUTH EVERY DAY, Disp: 30 tablet, Rfl: 3   benzonatate (TESSALON) 100 MG capsule, Take 1 capsule (100 mg total) by mouth 2 (two) times daily as needed for cough., Disp: 20 capsule, Rfl: 0   carvedilol (COREG) 12.5 MG tablet, TAKE 1 TABLET (12.'5MG'$  TOTAL) BY MOUTH TWICE A DAY WITH MEALS (Patient taking differently: Take 12.5 mg by mouth 2 (two) times daily with a meal.), Disp: 90 tablet, Rfl: 3   cetirizine (ZYRTEC) 10 MG tablet, Take 1 tablet (10 mg total) by mouth daily., Disp: 30 tablet, Rfl: 0   Cholecalciferol (VITAMIN D3) 125 MCG (5000 UT) CAPS, TAKE 1 CAPSULE BY MOUTH ONCE DAILY (Patient taking differently: Take 1 capsule by mouth daily.), Disp: 90 capsule, Rfl: 1   dicyclomine (BENTYL) 10 MG capsule, Take 1 capsule (10 mg total) by mouth 3 (three) times daily as needed for spasms., Disp: 20 capsule, Rfl: 1   dronabinol (MARINOL) 5 MG capsule, Take 1 capsule (5 mg total) by mouth 2 (two) times daily before a meal., Disp: 60 capsule, Rfl: 2   ELIQUIS 5 MG TABS tablet, TAKE 1 TABLET BY MOUTH TWICE A DAY, Disp: 60 tablet, Rfl: 3   ferrous sulfate 325 (65 FE) MG tablet, Take 1 tablet (325 mg total) by mouth daily with breakfast., Disp: 30 tablet,  Rfl: 0   fluticasone (FLONASE) 50 MCG/ACT nasal spray, Place 2 sprays into both nostrils daily., Disp: 16 g, Rfl: 1   furosemide (LASIX) 20 MG tablet, Take 1 tablet (20 mg total) by mouth daily as needed for fluid or edema., Disp: 30 tablet, Rfl: 1   gabapentin (NEURONTIN) 100 MG capsule, Take 1 capsule (100 mg total) by mouth at bedtime., Disp: 90 capsule, Rfl: 1   isosorbide mononitrate (IMDUR) 30 MG 24 hr tablet, TAKE 1 TABLET BY MOUTH EVERY DAY, Disp: 90 tablet, Rfl: 2   KLOR-CON M10 10 MEQ tablet, TAKE 1 TABLET BY MOUTH  EVERY DAY, Disp: 90 tablet, Rfl: 1   Misc. Devices MISC, Please provide with left upper extremity lymphedema sleeve, Disp: 1 Units, Rfl: 0   Omega-3 Fatty Acids 300 MG CAPS, Take 1 capsule (300 mg total) by mouth 2 (two) times daily. (Patient taking differently: Take 1 capsule by mouth daily.), Disp: , Rfl:    pantoprazole (PROTONIX) 40 MG tablet, Take 1 tablet (40 mg total) by mouth daily., Disp: 90 tablet, Rfl: 1   rosuvastatin (CRESTOR) 40 MG tablet, TAKE 1 TABLET BY MOUTH EVERY DAY, Disp: 90 tablet, Rfl: 2   sulfamethoxazole-trimethoprim (BACTRIM DS) 800-160 MG tablet, Take 1 tablet by mouth 2 (two) times daily., Disp: 10 tablet, Rfl: 0   terbinafine (LAMISIL) 1 % cream, Apply 1 Application topically 2 (two) times daily., Disp: 42 g, Rfl: 0   traMADol (ULTRAM) 50 MG tablet, Take 1 tablet (50 mg total) by mouth every 8 (eight) hours as needed., Disp: 90 tablet, Rfl: 0   levothyroxine (SYNTHROID) 150 MCG tablet, TAKE 1 TABLET BY MOUTH EVERY DAY BEFORE BREAKFAST, Disp: 30 tablet, Rfl: 2   nitroGLYCERIN (NITROSTAT) 0.4 MG SL tablet, Place 1 tablet (0.4 mg total) under the tongue every 5 (five) minutes x 3 doses as needed. (Patient not taking: Reported on 03/25/2022), Disp: 25 tablet, Rfl: 3   palbociclib (IBRANCE) 75 MG tablet, Take 1 tablet (75 mg total) by mouth daily. Take for 21 days on, 7 days off, repeat every 28 days. Continue to hold until follow up with oncology service. (Patient not taking: Reported on 03/25/2022), Disp: 21 tablet, Rfl: 3   prochlorperazine (COMPAZINE) 10 MG tablet, Take 1 tablet (10 mg total) by mouth every 6 (six) hours as needed for nausea or vomiting. (Patient not taking: Reported on 03/25/2022), Disp: 60 tablet, Rfl: 4   Allergies: Allergies  Allergen Reactions   Codeine Nausea And Vomiting    REVIEW OF SYSTEMS:   Review of Systems  Constitutional:  Negative for chills, fatigue and fever.  HENT:   Negative for lump/mass, mouth sores, nosebleeds, sore throat and  trouble swallowing.   Eyes:  Negative for eye problems.  Respiratory:  Negative for cough and shortness of breath.   Cardiovascular:  Negative for chest pain, leg swelling and palpitations.  Gastrointestinal:  Negative for abdominal pain, constipation, diarrhea, nausea and vomiting.  Genitourinary:  Negative for bladder incontinence, difficulty urinating, dysuria, frequency, hematuria and nocturia.   Musculoskeletal:  Negative for arthralgias, back pain, flank pain, myalgias and neck pain.  Skin:  Negative for itching and rash.  Neurological:  Negative for dizziness, headaches and numbness.  Hematological:  Does not bruise/bleed easily.  Psychiatric/Behavioral:  Negative for depression, sleep disturbance and suicidal ideas. The patient is not nervous/anxious.   All other systems reviewed and are negative.    VITALS:   Blood pressure (!) 141/59, pulse (!) 56, temperature 97.6 F (36.4  C), temperature source Oral, resp. rate 16, weight 177 lb 14.6 oz (80.7 kg), SpO2 98 %.  Wt Readings from Last 3 Encounters:  03/25/22 177 lb 14.6 oz (80.7 kg)  03/04/22 188 lb 9.6 oz (85.5 kg)  02/24/22 190 lb (86.2 kg)    Body mass index is 28.72 kg/m.  Performance status (ECOG): 1 - Symptomatic but completely ambulatory  PHYSICAL EXAM:   Physical Exam Vitals and nursing note reviewed. Exam conducted with a chaperone present.  Constitutional:      Appearance: Normal appearance.  Cardiovascular:     Rate and Rhythm: Normal rate and regular rhythm.     Pulses: Normal pulses.     Heart sounds: Normal heart sounds.  Pulmonary:     Effort: Pulmonary effort is normal.     Breath sounds: Normal breath sounds.  Abdominal:     Palpations: Abdomen is soft. There is no hepatomegaly, splenomegaly or mass.     Tenderness: There is no abdominal tenderness.  Musculoskeletal:     Right lower leg: No edema.     Left lower leg: No edema.  Lymphadenopathy:     Cervical: No cervical adenopathy.     Right  cervical: No superficial, deep or posterior cervical adenopathy.    Left cervical: No superficial, deep or posterior cervical adenopathy.     Upper Body:     Right upper body: No supraclavicular or axillary adenopathy.     Left upper body: No supraclavicular or axillary adenopathy.  Neurological:     General: No focal deficit present.     Mental Status: She is alert and oriented to person, place, and time.  Psychiatric:        Mood and Affect: Mood normal.        Behavior: Behavior normal.     LABS:      Latest Ref Rng & Units 03/25/2022   10:03 AM 02/24/2022    9:45 AM 01/23/2022    1:48 PM  CBC  WBC 4.0 - 10.5 K/uL 3.7  2.0  2.7   Hemoglobin 12.0 - 15.0 g/dL 10.6  10.9  11.3   Hematocrit 36.0 - 46.0 % 32.5  33.4  35.2   Platelets 150 - 400 K/uL 180  166  187       Latest Ref Rng & Units 02/24/2022    9:45 AM 01/23/2022    1:48 PM 12/26/2021    9:15 AM  CMP  Glucose 70 - 99 mg/dL 97  93  96   BUN 8 - 23 mg/dL '18  22  16   '$ Creatinine 0.44 - 1.00 mg/dL 1.37  1.38  1.75   Sodium 135 - 145 mmol/L 136  137  137   Potassium 3.5 - 5.1 mmol/L 3.9  3.9  3.5   Chloride 98 - 111 mmol/L 108  106  111   CO2 22 - 32 mmol/L '18  21  20   '$ Calcium 8.9 - 10.3 mg/dL 8.8  9.2  8.8   Total Protein 6.5 - 8.1 g/dL 7.5  7.6  7.1   Total Bilirubin 0.3 - 1.2 mg/dL 1.0  0.9  0.4   Alkaline Phos 38 - 126 U/L 144  154  169   AST 15 - 41 U/L 54  55  32   ALT 0 - 44 U/L '12  19  12      '$ No results found for: "CEA1", "CEA" / No results found for: "CEA1", "CEA" No results found for: "PSA1"  No results found for: "[id number]" No results found for: "CAN125"  No results found for: "TOTALPROTELP", "ALBUMINELP", "A1GS", "A2GS", "BETS", "BETA2SER", "GAMS", "MSPIKE", "SPEI" Lab Results  Component Value Date   TIBC 414 12/26/2021   TIBC 469 (H) 10/28/2021   TIBC 512 (H) 05/01/2020   FERRITIN 119 12/26/2021   FERRITIN 18 10/28/2021   FERRITIN 46 07/25/2021   IRONPCTSAT 21 12/26/2021   IRONPCTSAT 8 (L) 10/28/2021    IRONPCTSAT 16 05/01/2020   No results found for: "LDH"   STUDIES:   DG HIP UNILAT WITH PELVIS 2-3 VIEWS LEFT  Result Date: 03/11/2022 CLINICAL DATA:  Stage IV breast cancer.  Bone pain. EXAM: DG HIP (WITH OR WITHOUT PELVIS) 2-3V LEFT COMPARISON:  Bone scan dated 02/11/2022. FINDINGS: There is no acute fracture or dislocation. The bones are osteopenic. Innumerable scattered sclerotic lesions consistent with metastatic disease. Degenerative changes of the lower lumbar spine. The soft tissues are grossly unremarkable. IMPRESSION: 1. No acute fracture or dislocation. 2. Innumerable scattered sclerotic lesions consistent with metastatic disease. Electronically Signed   By: Anner Crete M.D.   On: 03/11/2022 19:59   CT CHEST ABDOMEN PELVIS W CONTRAST  Result Date: 03/10/2022 CLINICAL DATA:  82 year old female with history of stage IV breast cancer. Follow-up study. EXAM: CT CHEST, ABDOMEN, AND PELVIS WITH CONTRAST TECHNIQUE: Multidetector CT imaging of the chest, abdomen and pelvis was performed following the standard protocol during bolus administration of intravenous contrast. RADIATION DOSE REDUCTION: This exam was performed according to the departmental dose-optimization program which includes automated exposure control, adjustment of the mA and/or kV according to patient size and/or use of iterative reconstruction technique. CONTRAST:  178m OMNIPAQUE IOHEXOL 300 MG/ML  SOLN COMPARISON:  Chest CT 62,023. CT of the chest, abdomen and pelvis 02/11/2021. FINDINGS: CT CHEST FINDINGS Cardiovascular: Heart size is normal. There is no significant pericardial fluid, thickening or pericardial calcification. There is aortic atherosclerosis, as well as atherosclerosis of the great vessels of the mediastinum and the coronary arteries, including calcified atherosclerotic plaque in the left main, left anterior descending, left circumflex and right coronary arteries. Calcifications of the aortic valve and mitral  annulus. Mediastinum/Nodes: No pathologically enlarged mediastinal, hilar or internal mammary lymph nodes. Please note that accurate exclusion of hilar adenopathy is limited on noncontrast CT scans. Esophagus is unremarkable in appearance. No axillary lymphadenopathy. Lungs/Pleura: New mass-like area in the posterior aspect of the right upper lobe (axial image 57 of series 4) measuring 3.1 x 1.7 cm, with some internal air bronchograms. Other clustered micro nodules in the right upper lobe best appreciated on axial image 46 of series 4 where the largest of these measures 5 mm, slightly more apparent than the prior examination. Widespread septal thickening with multiple other areas of nodularity, most evident in the inferior aspect of the right lung abutting the major fissure where there is nodularity on both sides of the lateral aspect of the right major fissure in the right lower and right middle lobes, largest of which measures 1.0 x 0.5 cm (axial image 89 of series 4). Small bilateral pleural effusions with areas of dependent subsegmental atelectasis in the lower lobes of the lungs bilaterally. There may also be some consolidation or infiltrative soft tissue in the medial aspect of the left lower lobe best appreciated on axial image 39 of series 2). Musculoskeletal: Status post right modified radical mastectomy. Mass-like architectural distortion in the left breast adjacent to focal area of skin retraction (axial image 39 of series 2) measuring 4.7 x 2.2  cm, similar to the prior examination, favored to represent an area of chronic post lumpectomy scarring. Widespread predominantly sclerotic lesions noted throughout the visualized axial and appendicular skeleton, similar to prior examinations, indicative of widespread metastatic disease to the bones. Healing nondisplaced fracture of the posterolateral left tenth rib noted. CT ABDOMEN PELVIS FINDINGS Hepatobiliary: No definite suspicious cystic or solid hepatic  lesions are confidently identified on today's noncontrast CT examination. Status post cholecystectomy. Pancreas: No definite pancreatic mass or peripancreatic fluid collections or inflammatory changes are noted on today's noncontrast CT examination. Spleen: Small splenule inferior to the spleen. Spleen is otherwise unremarkable in appearance. Adrenals/Urinary Tract: Partially exophytic intermediate attenuation lesion in the lower pole of the left kidney measuring 1.5 cm in diameter, incompletely characterized on today's noncontrast CT examination, but stable compared to the prior study suggesting a benign lesion, potentially a proteinaceous/hemorrhagic cyst (no imaging follow-up recommended). Right kidney and bilateral adrenal glands are normal in appearance. No hydroureteronephrosis. Urinary bladder is unremarkable in appearance. Stomach/Bowel: Unenhanced appearance of the stomach is normal. There is no pathologic dilatation of small bowel or colon. Numerous colonic diverticuli are noted, particularly in the sigmoid colon, without surrounding inflammatory changes to indicate an acute diverticulitis at this time. The appendix is not confidently identified and may be surgically absent. Regardless, there are no inflammatory changes noted adjacent to the cecum to suggest the presence of an acute appendicitis at this time. Vascular/Lymphatic: Atherosclerotic calcifications in the abdominal aorta and pelvic vasculature. No lymphadenopathy noted in the abdomen or pelvis. Reproductive: Status post hysterectomy. Ovaries are not confidently identified may be surgically absent or atrophic. Other: No significant volume of ascites.  No pneumoperitoneum. Musculoskeletal: Innumerable mixed lytic and sclerotic lesions are noted throughout the visualized axial and appendicular skeleton, increased in number and size compared to the prior CT examination 02/11/2021, compatible with widespread metastatic disease to the bones. Among the  largest of these lesions is a predominantly lytic lesion in the right ilium which currently measures up to 4.7 x 2.9 cm with associated soft tissue component (axial image 82 of series 2). IMPRESSION: 1. Today's study demonstrates progression of widespread metastatic disease to the bones, as detailed above. 2. There also findings in the lungs concerning for lymphangitic spread of tumor, with numerous pulmonary nodules and masses, largest of which is in the posterior aspect of the right upper lobe measuring 3.1 x 1.7 cm. 3. Small bilateral pleural effusions (left-greater-than-right) with areas of passive atelectasis in the lower lobes of the lungs bilaterally. There may also be some focal airspace consolidation in the posteromedial left lower lobe (although infiltrative tumor in this region is not excluded). 4. No definitive findings of extra skeletal metastatic disease in the abdomen or pelvis. 5. Severe colonic diverticulosis without evidence of acute diverticulitis at this time. 6. Aortic atherosclerosis, in addition to left main and three-vessel coronary artery disease. 7. There are calcifications of the aortic valve and mitral annulus. Echocardiographic correlation for evaluation of potential valvular dysfunction may be warranted if clinically indicated. 8. Additional incidental findings, as above. Electronically Signed   By: Vinnie Langton M.D.   On: 03/10/2022 10:01

## 2022-03-25 ENCOUNTER — Other Ambulatory Visit (HOSPITAL_COMMUNITY): Payer: Self-pay | Admitting: Physician Assistant

## 2022-03-25 ENCOUNTER — Inpatient Hospital Stay: Payer: Medicare PPO

## 2022-03-25 ENCOUNTER — Inpatient Hospital Stay: Payer: Medicare PPO | Attending: Hematology

## 2022-03-25 ENCOUNTER — Other Ambulatory Visit (HOSPITAL_COMMUNITY)
Admission: RE | Admit: 2022-03-25 | Discharge: 2022-03-25 | Disposition: A | Discharge status: Home / Self Care | Payer: Medicare PPO | Source: Ambulatory Visit | Attending: Internal Medicine | Admitting: Internal Medicine

## 2022-03-25 ENCOUNTER — Inpatient Hospital Stay: Payer: Medicare PPO | Admitting: Hematology

## 2022-03-25 VITALS — BP 141/59 | HR 56 | Temp 97.6°F | Resp 16 | Wt 177.9 lb

## 2022-03-25 DIAGNOSIS — I429 Cardiomyopathy, unspecified: Secondary | ICD-10-CM | POA: Insufficient documentation

## 2022-03-25 DIAGNOSIS — Z79811 Long term (current) use of aromatase inhibitors: Secondary | ICD-10-CM | POA: Insufficient documentation

## 2022-03-25 DIAGNOSIS — C7800 Secondary malignant neoplasm of unspecified lung: Secondary | ICD-10-CM | POA: Insufficient documentation

## 2022-03-25 DIAGNOSIS — K760 Fatty (change of) liver, not elsewhere classified: Secondary | ICD-10-CM | POA: Diagnosis not present

## 2022-03-25 DIAGNOSIS — C50912 Malignant neoplasm of unspecified site of left female breast: Secondary | ICD-10-CM | POA: Diagnosis not present

## 2022-03-25 DIAGNOSIS — R059 Cough, unspecified: Secondary | ICD-10-CM | POA: Insufficient documentation

## 2022-03-25 DIAGNOSIS — M25569 Pain in unspecified knee: Secondary | ICD-10-CM | POA: Diagnosis not present

## 2022-03-25 DIAGNOSIS — E119 Type 2 diabetes mellitus without complications: Secondary | ICD-10-CM | POA: Insufficient documentation

## 2022-03-25 DIAGNOSIS — I700 Atherosclerosis of aorta: Secondary | ICD-10-CM | POA: Insufficient documentation

## 2022-03-25 DIAGNOSIS — D649 Anemia, unspecified: Secondary | ICD-10-CM | POA: Diagnosis not present

## 2022-03-25 DIAGNOSIS — C7951 Secondary malignant neoplasm of bone: Secondary | ICD-10-CM | POA: Diagnosis not present

## 2022-03-25 DIAGNOSIS — Z170 Estrogen receptor positive status [ER+]: Secondary | ICD-10-CM

## 2022-03-25 DIAGNOSIS — I2510 Atherosclerotic heart disease of native coronary artery without angina pectoris: Secondary | ICD-10-CM | POA: Diagnosis not present

## 2022-03-25 DIAGNOSIS — E039 Hypothyroidism, unspecified: Secondary | ICD-10-CM

## 2022-03-25 DIAGNOSIS — I10 Essential (primary) hypertension: Secondary | ICD-10-CM | POA: Insufficient documentation

## 2022-03-25 DIAGNOSIS — R630 Anorexia: Secondary | ICD-10-CM | POA: Diagnosis not present

## 2022-03-25 DIAGNOSIS — E785 Hyperlipidemia, unspecified: Secondary | ICD-10-CM | POA: Diagnosis not present

## 2022-03-25 DIAGNOSIS — J90 Pleural effusion, not elsewhere classified: Secondary | ICD-10-CM | POA: Diagnosis not present

## 2022-03-25 DIAGNOSIS — Z79890 Hormone replacement therapy (postmenopausal): Secondary | ICD-10-CM | POA: Insufficient documentation

## 2022-03-25 DIAGNOSIS — C50911 Malignant neoplasm of unspecified site of right female breast: Secondary | ICD-10-CM | POA: Insufficient documentation

## 2022-03-25 DIAGNOSIS — Z86718 Personal history of other venous thrombosis and embolism: Secondary | ICD-10-CM | POA: Diagnosis not present

## 2022-03-25 DIAGNOSIS — I252 Old myocardial infarction: Secondary | ICD-10-CM | POA: Diagnosis not present

## 2022-03-25 DIAGNOSIS — D508 Other iron deficiency anemias: Secondary | ICD-10-CM

## 2022-03-25 DIAGNOSIS — M8580 Other specified disorders of bone density and structure, unspecified site: Secondary | ICD-10-CM | POA: Diagnosis not present

## 2022-03-25 DIAGNOSIS — Z8673 Personal history of transient ischemic attack (TIA), and cerebral infarction without residual deficits: Secondary | ICD-10-CM | POA: Insufficient documentation

## 2022-03-25 DIAGNOSIS — I3481 Nonrheumatic mitral (valve) annulus calcification: Secondary | ICD-10-CM | POA: Insufficient documentation

## 2022-03-25 DIAGNOSIS — Z79899 Other long term (current) drug therapy: Secondary | ICD-10-CM | POA: Insufficient documentation

## 2022-03-25 DIAGNOSIS — M47816 Spondylosis without myelopathy or radiculopathy, lumbar region: Secondary | ICD-10-CM | POA: Insufficient documentation

## 2022-03-25 LAB — CBC
HCT: 32.5 % — ABNORMAL LOW (ref 36.0–46.0)
Hemoglobin: 10.6 g/dL — ABNORMAL LOW (ref 12.0–15.0)
MCH: 30.1 pg (ref 26.0–34.0)
MCHC: 32.6 g/dL (ref 30.0–36.0)
MCV: 92.3 fL (ref 80.0–100.0)
Platelets: 180 10*3/uL (ref 150–400)
RBC: 3.52 MIL/uL — ABNORMAL LOW (ref 3.87–5.11)
RDW: 20.4 % — ABNORMAL HIGH (ref 11.5–15.5)
WBC: 3.7 10*3/uL — ABNORMAL LOW (ref 4.0–10.5)
nRBC: 0.8 % — ABNORMAL HIGH (ref 0.0–0.2)

## 2022-03-25 LAB — T4, FREE: Free T4: 0.9 ng/dL (ref 0.61–1.12)

## 2022-03-25 LAB — TSH: TSH: 11.538 u[IU]/mL — ABNORMAL HIGH (ref 0.350–4.500)

## 2022-03-25 MED ORDER — LEVOTHYROXINE SODIUM 150 MCG PO TABS: ORAL_TABLET | ORAL | 2 refills | Status: DC

## 2022-03-25 MED ORDER — DRONABINOL 5 MG PO CAPS: 5.0000 mg | ORAL_CAPSULE | Freq: Two times a day (BID) | ORAL | 2 refills | Status: DC

## 2022-03-25 NOTE — Progress Notes (Signed)
START ON PATHWAY REGIMEN - Breast     A cycle is every 21 days:     Fam-trastuzumab deruxtecan-nxki   **Always confirm dose/schedule in your pharmacy ordering system**  Patient Characteristics: Distant Metastases or Locoregional Recurrent Disease - Unresected or Locally Advanced Unresectable Disease Progressing after Neoadjuvant and Local Therapies, HER2 Low/Negative, ER Positive, Chemotherapy, HER2 Low, Second Line, gBRCA Wildtype or Not a  Candidate for Molecular Targeted Therapy Therapeutic Status: Distant Metastases HER2 Status: Low ER Status: Positive (+) PR Status: Positive (+) Therapy Approach Indicated: Standard Chemotherapy/Endocrine Therapy Line of Therapy: Second Line Intent of Therapy: Non-Curative / Palliative Intent, Discussed with Patient

## 2022-03-25 NOTE — Patient Instructions (Addendum)
[place] at [facility] Discharge Instructions   You were seen and examined today by Dr. Delton Coombes.  He reviewed the results of your lab work. Your TSH is elevated at 11. We will   He discussed with you changing treatment to an IV medication that you will receive in the clinic. This drug is called Enhertu. This is given every 3 weeks.   We will arrange for you to have a port a cath placed to administer the Enhertu.   You may stop the Ibrance pills.   Increase Synthroid to 150 mcg daily 30 minutes prior to eating your first meal. A new prescription was sent.   He sent Marinol for you to help stimulate your appetite. If this pill should make your dizzy or drowsy, let us know.   Return as scheduled.      Thank you for choosing [place] at [facility] to provide your oncology and hematology care.  To afford each patient quality time with our provider, please arrive at least 15 minutes before your scheduled appointment time.   If you have a lab appointment with the [facility] please come in thru the Main Entrance and check in at the main information desk.  You need to re-schedule your appointment should you arrive 10 or more minutes late.  We strive to give you quality time with our providers, and arriving late affects you and other patients whose appointments are after yours.  Also, if you no show three or more times for appointments you may be dismissed from the clinic at the providers discretion.     Again, thank you for choosing [facility].  Our hope is that these requests will decrease the amount of time that you wait before being seen by our physicians.       _____________________________________________________________  Should you have questions after your visit to [facility], please contact our office at [phone] and follow the prompts.  Our office hours are 8:00 a.m. and 4:30 p.m. Monday -  Friday.  Please note that voicemails left after 4:00 p.m. may not be returned until the following business day.  We are closed weekends and major holidays.  You do have access to a nurse 24-7, just call the main number to the clinic [phone] and do not press any options, hold on the line and a nurse will answer the phone.    For prescription refill requests, have your pharmacy contact our office and allow 72 hours.    Due to Covid, you will need to wear a mask upon entering the hospital. If you do not have a mask, a mask will be given to you at the Main Entrance upon arrival. For doctor visits, patients may have 1 support person age 73 or older with them. For treatment visits, patients can not have anyone with them due to social distancing guidelines and our immunocompromised population.

## 2022-03-25 NOTE — Patient Instructions (Addendum)
Fam-trastuzumab deruxtecan (Enhertu)   About This Medicine   Fam-trastuzumab deruxtecan-nxki is used to treat cancer. It is given in the vein (IV).   Possible Side Effects   Bone marrow suppression. This is a decrease in the number of white blood cells, red blood cells, and platelets. This may raise your risk of infection, make you tired and weak, and raise your risk of bleeding.    Nausea and vomiting (throwing up)    Constipation (not able to move bowels)    Diarrhea (loose bowel movements)    Tiredness    Fever    Decreased appetite (decreased hunger)    Changes in your liver function    Decreased level of potassium in your blood   Bone and muscle pain    Respiratory tract infection    Hair loss. Hair loss is often temporary, although with certain medicine, hair loss can sometimes be permanent. Hair loss may happen suddenly or gradually. If you lose hair, you may lose it from your head, face, armpits, pubic area, chest, and/or legs. You may also notice your hair getting thin.   Note: Each of the side effects above was reported in 20% or greater of patients treated with famtrastuzumab deruxtecan-nxki. All possible side effects are not included. Your side effects may be different depending on your cancer diagnosis, condition, or if you are receiving other medicines in combination. Please discuss any concerns or questions with your medical team.   Warnings and Precautions  Inflammation (swelling) and/or scarring of the lungs, which can be life-threatening. You may have a cough and/or trouble breathing.  Severe decrease in the number of white blood cells, including neutropenic fever - a type of fever that can develop when you have a very low number of white blood cells which can be lifethreatening. Fam-trastuzumab deruxtecan-nxki (Enhertu) continued Page 2 of 4 User/Authorized User acknowledges that the [employer] Portal is intended to be utilized as an Science writer  only, and that [employer] has not represented the ClinicalPath Portal as having the ability to diagnose disease, prescribe treatment, or perform any other tasks that constitute the practice of providing medical care. The clinical information contained within the ClinicalPath Portal is intended as a supplement to, and not a substitute for, the knowledge, expertise, skill, and judgment of physicians, pharmacists and other healthcare professionals involved with patient care at User/Authorized User facilities. CONFIDENTIAL AND PROPRIETARY. NOT FOR DISTRIBUTION.  Changes in your heart's ability to pump blood properly Note: Some of the side effects above are very rare. If you have concerns and/or questions, please discuss them with your medical team. Important Information  Cytotoxic medicines leave the body through urine and stool, but they can also be present in other body fluids such as blood, vomit, semen, and vaginal fluids. Take precautions to prevent others from coming in contact with your medicine or your body fluids. Follow safety precautions during your treatment and for as long as directed by your health care provider after your treatment. If you take a cytotoxic pill each day, follow these precautions every day. Treating Side Effects  Manage tiredness by pacing your activities for the day.  Be sure to include periods of rest between energy-draining activities.  Get regular exercise, with your doctor's approval. If you feel too tired to exercise vigorously, try taking a short walk.  To decrease the risk of infection, wash your hands regularly.  Avoid close contact with people who have a cold, the flu, or other infections.  Take your  temperature as your doctor or nurse tells you, and whenever you feel like you may have a fever.  To help decrease the risk of bleeding, use a soft toothbrush. Check with your nurse before using dental floss.  Be very careful when using knives or tools.  Use an electric shaver instead  of a razor.  Drink Enough fluids to keep your urine pale yellow.  If you throw up or have diarrhea, you should drink more fluids so that you do not become dehydrated (lack of water in the body from losing too much fluid).  To help with nausea and vomiting, eat small, frequent meals instead of three large meals a day. Choose foods and drinks that are at room temperature. Ask your nurse or doctor about other helpful tips and medicine that is available to help stop or lessen these symptoms.  Ask your doctor or nurse about medicines that are available to help stop or lessen constipation or diarrhea.  If you have diarrhea, eat low-fiber foods that are high in protein and calories and avoid foods that can irritate your digestive tracts or lead to cramping.  If you are not able to move your bowels, check with your doctor or nurse before you use enemas, laxatives, or suppositories.  To help with decreased appetite, eat small, frequent meals. Eat foods high in calories and protein, such as meat, poultry, fish, dry beans, tofu, eggs, nuts, milk, yogurt, cheese, ice cream, pudding, and nutritional supplements.  Consider using sauces and spices to increase taste. Daily exercise, with your doctor's approval, may increase your appetite. Fam-trastuzumab deruxtecan-nxki (Enhertu) continued Page 3 of 4 User/Authorized User acknowledges that the ClinicalPath Portal is intended to be utilized as an Science writer only, and that [employer] has not represented the ClinicalPath Portal as having the ability to diagnose disease, prescribe treatment, or perform any other tasks that constitute the practice of providing medical care. The clinical information contained within the ClinicalPath Portal is intended as a supplement to, and not a substitute for, the knowledge, expertise, skill, and judgment of physicians, pharmacists and other healthcare professionals involved with patient care at User/Authorized User facilities.  CONFIDENTIAL AND PROPRIETARY. NOT FOR DISTRIBUTION.  Keeping your pain under control is important to your well-being. Please tell your doctor or nurse if you are experiencing pain. Food and Medicine Interactions  There are no known interactions of fam-trastuzumab deruxtecan-nxki with food.  Check with your doctor or pharmacist about all other prescription medicines and over-the-counter medicines and dietary supplements (vitamins, minerals, herbs, and others) you are taking before starting this medicine as there are known medicine interactions with fam-trastuzumab deruxtecannxki. Also, check with your doctor or pharmacist before starting any new prescription or over-thecounter medicines, or dietary supplements to make sure that there are no interactions. When to Call the Doctor Call your doctor or nurse if you have any of these symptoms and/or any new or unusual symptoms:  Fever of 100.4 F (38 C) or higher  Chills  Tiredness and/or weakness that interferes with your daily activities  Dry cough or coughing up yellow, green, or bloody mucus  Pain in your chest  Wheezing and/or trouble breathing  Feeling dizzy or lightheaded  Easy bleeding or bruising  Nausea that stops you from eating or drinking and/or is not relieved by prescribed medicines  Throwing up more than 3 times a day  No bowel movement in 3 days or when you feel uncomfortable  Diarrhea, 4 times in one day or diarrhea with lack  of strength or a feeling of being dizzy  Lasting loss of appetite or rapid weight loss of five pounds in a week  Swelling of the hands, feet, or any other part of the body  Weight gain of 5 pounds in one week (fluid retention)  Pain that does not go away, or is not relieved by prescribed medicines  Signs of possible liver problems: dark urine, pale bowel movements, pain in your abdomen, feeling very tired and weak, unusual itching, or yellowing of the eyes or skin  If you think you may be pregnant or may have impregnated your  partner Reproduction Warnings  Pregnancy warning: This medicine can have harmful effects on the unborn baby. Women of childbearing potential should use effective methods of birth control during your cancer treatment and for 7 months after stopping treatment. Men with female partners of childbearing potential should use effective methods of birth control during your cancer treatment and for 4 months after Fam-trastuzumab deruxtecan-nxki (Enhertu) continued Page 4 of 4 User/Authorized User acknowledges that the [employer] Portal is intended to be utilized as an Science writer only, and that [employer] has not represented the ClinicalPath Portal as having the ability to diagnose disease, prescribe treatment, or perform any other tasks that constitute the practice of providing medical care. The clinical information contained within the ClinicalPath Portal is intended as a supplement to, and not a substitute for, the knowledge, expertise, skill, and judgment of physicians, pharmacists and other healthcare professionals involved with patient care at User/Authorized User facilities. CONFIDENTIAL AND PROPRIETARY. NOT FOR DISTRIBUTION. stopping treatment. Let your doctor know right away if you think you may be pregnant or may have impregnated your partner.  Breastfeeding warning: Women should not breastfeed during treatment and for 7 months after stopping treatment because this medicine could enter the breast milk and cause harm to a breastfeeding baby.  Fertility warning: In men, this medicine may affect your ability to have children in the future. Talk with your doctor or nurse if you plan to have children. Ask for information on sperm banking. Revised September 2023

## 2022-03-25 NOTE — H&P (Addendum)
Chief Complaint: Patient was seen in consultation today for breast cancer.  Referring Physician(s): Katragadda,Sreedhar  Supervising Physician: Marliss Coots  Patient Status: [facility] - Out-pt  History of Present Illness:  Robin [name] is a 82 y.o. female with a past medical history significant for CAD, HTN, HLD, NSTEMI, CVA, LUE DVT (currently on Eliquis), DM and breast cancer s/p bilateral mastectomy who presents today for port placement. Robin [name] was first diagnosed with right breast cancer in 1987 and underwent right mastectomy without additional systemic or radiation treatments. She was then diagnosed left breast cancer in 2007 and underwent left mastectomy. She was placed on Tamoxifen which was discontinued around 2015. She had a CTA chest 04/04/2020 for chest pain which showed:  1. Limited study demonstrating no central, lobar, or proximal segmental sized pulmonary embolism. Smaller distal segmental and subsegmental sized emboli cannot be entirely excluded. 2. Mild cardiomegaly with evidence of mild interstitial pulmonary edema in the lungs; imaging findings suggestive of congestive heart failure. 3. Irregular masslike area in the left chest wall concerning for potential recurrent breast neoplasm measuring approximately 4.5 x 3.1 cm. There also lytic lesions in T1 and T3 vertebral bodies as well as the manubrium. The possibility of metastatic disease should be considered. Further evaluation with PET-CT should be considered. 4. Aortic atherosclerosis, in addition to left main and 3 vessel coronary artery disease.  She then underwent left chest wall mass biopsy in IR on 04/05/2020 which was compatible with recurrent breast adenocarcinoma. She then underwent PET scan 05/16/20 which showed:  Hypermetabolic mass in left anterior chest wall, consistent with recurrent breast carcinoma.   Mild hypermetabolic thoracic lymphadenopathy, as described above, consistent with  metastatic disease.   Indeterminate sub-cm bilateral pulmonary nodules, which are too small to characterize by PET. By morphology, these may be infectious/inflammatory in etiology, although pulmonary metastases cannot be excluded. Continued attention recommended on follow-up imaging.   Diffuse bone metastases.  She was started on anastrozole in May of 2022 followed by Ilda Foil in June of 2023 which she continues on. She is followed regularly by oncology and underwent bone scan CT chest/abd/pelvis w/contrast 03/10/22 to evaluate treatment response which showed:  1. Today's study demonstrates progression of widespread metastatic disease to the bones, as detailed above. 2. There also findings in the lungs concerning for lymphangitic spread of tumor, with numerous pulmonary nodules and masses, largest of which is in the posterior aspect of the right upper lobe measuring 3.1 x 1.7 cm. 3. Small bilateral pleural effusions (left-greater-than-right) with areas of passive atelectasis in the lower lobes of the lungs bilaterally. There may also be some focal airspace consolidation in the posteromedial left lower lobe (although infiltrative tumor in this region is not excluded). 4. No definitive findings of extra skeletal metastatic disease in the abdomen or pelvis. 5. Severe colonic diverticulosis without evidence of acute diverticulitis at this time. 6. Aortic atherosclerosis, in addition to left main and three-vessel coronary artery disease. 7. There are calcifications of the aortic valve and mitral annulus. Echocardiographic correlation for evaluation of potential valvular dysfunction may be warranted if clinically indicated.  Due to disease progression on Ibrance and anastrozole she is planned to begin systemic chemotherapy and IR has been consulted for port placement.  Pt denies chills, fever, SOB, CP, abd pain, N/V, dizziness or headaches.  She endorses decrease in appetite, fatigue,  weight loss, occasional cough and weakness.  She is NPO per order.   Past Medical History:  Diagnosis Date   Breast cancer (  HCC)    Tamoxifen started 2007   CAD (coronary artery disease)    DES second diagonal 2/12   Cardiomyopathy    Probable Takotsubo, LVEF 30-35% 2/12   Carotid artery disease (HCC)    Essential hypertension    Hepatic steatosis    Hyperlipidemia    Hypothyroidism    NSTEMI (non-ST elevated myocardial infarction) (HCC)    2/12   Pancreas divisum    Stroke (HCC)    (2000) residual left-sided weakness   Type 2 diabetes mellitus (HCC)    reports not currently diabetic    Past Surgical History:  Procedure Laterality Date   ABDOMINAL HYSTERECTOMY     CAROTID ENDARTERECTOMY     Left   CHOLECYSTECTOMY     COLONOSCOPY WITH PROPOFOL N/A 05/03/2020   Procedure: COLONOSCOPY WITH PROPOFOL;  Surgeon: Corbin Ade, MD;  Location: [facility];  Service: Endoscopy;  Laterality: N/A;   ESOPHAGOGASTRODUODENOSCOPY (EGD) WITH PROPOFOL N/A 05/02/2020   Procedure: ESOPHAGOGASTRODUODENOSCOPY (EGD) WITH PROPOFOL;  Surgeon: Malissa Hippo, MD;  Location: [facility];  Service: Endoscopy;  Laterality: N/A;   GIVENS CAPSULE STUDY N/A 05/04/2020   Procedure: GIVENS CAPSULE STUDY;  Surgeon: Dolores Frame, MD;  Location: [facility];  Service: Gastroenterology;  Laterality: N/A;   JOINT REPLACEMENT     Right knee replacement- [facility]. Theod[facility]   MASTECTOMY     Bilateral   POLYPECTOMY  05/03/2020   Procedure: POLYPECTOMY;  Surgeon: Corbin Ade, MD;  Location: [facility];  Service: Endoscopy;;    Allergies: Codeine  Medications: Prior to Admission medications   Medication Sig Start Date End Date Taking? Authorizing Provider  acetaminophen (TYLENOL) 325 MG tablet Take 2 tablets (650 mg total) by mouth every 6 (six) hours as needed for mild pain, fever or headache (or Fever >/= 101). Patient taking differently: Take 325 mg by mouth every 6 (six)  hours as needed for mild pain, fever or headache (or Fever >/= 101). 04/06/20   Shon Hale, MD  amLODipine (NORVASC) 10 MG tablet TAKE 1 TABLET BY MOUTH EVERY DAY 03/11/22   Anabel Halon, MD  anastrozole (ARIMIDEX) 1 MG tablet TAKE 1 TABLET BY MOUTH EVERY DAY 12/23/21   Doreatha Massed, MD  benzonatate (TESSALON) 100 MG capsule Take 1 capsule (100 mg total) by mouth 2 (two) times daily as needed for cough. 01/09/22   Anabel Halon, MD  carvedilol (COREG) 12.5 MG tablet TAKE 1 TABLET (12.5MG  TOTAL) BY MOUTH TWICE A DAY WITH MEALS Patient taking differently: Take 12.5 mg by mouth 2 (two) times daily with a meal. 11/18/21   Allena Katz, Earlie Lou, MD  cetirizine (ZYRTEC) 10 MG tablet Take 1 tablet (10 mg total) by mouth daily. 05/07/21   Anabel Halon, MD  Cholecalciferol (VITAMIN D3) 125 MCG (5000 UT) CAPS TAKE 1 CAPSULE BY MOUTH ONCE DAILY Patient taking differently: Take 1 capsule by mouth daily. 01/14/18   Roma Kayser, MD  dicyclomine (BENTYL) 10 MG capsule Take 1 capsule (10 mg total) by mouth 3 (three) times daily as needed for spasms. 03/04/22   Anabel Halon, MD  ELIQUIS 5 MG TABS tablet TAKE 1 TABLET BY MOUTH TWICE A DAY 03/11/22   Doreatha Massed, MD  ferrous sulfate 325 (65 FE) MG tablet Take 1 tablet (325 mg total) by mouth daily with breakfast. 05/06/20   Myrtie Neither, MD  fluticasone (FLONASE) 50 MCG/ACT nasal spray Place 2 sprays into both nostrils daily. 05/07/21  Anabel Halon, MD  furosemide (LASIX) 20 MG tablet Take 1 tablet (20 mg total) by mouth daily as needed for fluid or edema. 03/04/22   Anabel Halon, MD  gabapentin (NEURONTIN) 100 MG capsule Take 1 capsule (100 mg total) by mouth at bedtime. 03/17/22   Anabel Halon, MD  isosorbide mononitrate (IMDUR) 30 MG 24 hr tablet TAKE 1 TABLET BY MOUTH EVERY DAY 03/11/22   Anabel Halon, MD  KLOR-CON M10 10 MEQ tablet TAKE 1 TABLET BY MOUTH EVERY DAY 01/02/22   Anabel Halon, MD  levothyroxine  (SYNTHROID) 150 MCG tablet TAKE 1 TABLET BY MOUTH EVERY DAY BEFORE BREAKFAST 03/25/22   Doreatha Massed, MD  Misc. Devices MISC Please provide with left upper extremity lymphedema sleeve 11/25/21   Doreatha Massed, MD  nitroGLYCERIN (NITROSTAT) 0.4 MG SL tablet Place 1 tablet (0.4 mg total) under the tongue every 5 (five) minutes x 3 doses as needed. Patient not taking: Reported on 03/25/2022 01/11/14   Jonelle Sidle, MD  Omega-3 Fatty Acids 300 MG CAPS Take 1 capsule (300 mg total) by mouth 2 (two) times daily. Patient taking differently: Take 1 capsule by mouth daily. 04/24/11   Serpe, Clide Deutscher, PA-C  palbociclib (IBRANCE) 75 MG tablet Take 1 tablet (75 mg total) by mouth daily. Take for 21 days on, 7 days off, repeat every 28 days. Continue to hold until follow up with oncology service. Patient not taking: Reported on 03/25/2022 01/23/22   Doreatha Massed, MD  pantoprazole (PROTONIX) 40 MG tablet Take 1 tablet (40 mg total) by mouth daily. 06/24/21   Anabel Halon, MD  prochlorperazine (COMPAZINE) 10 MG tablet Take 1 tablet (10 mg total) by mouth every 6 (six) hours as needed for nausea or vomiting. Patient not taking: Reported on 03/25/2022 11/28/21   Anabel Halon, MD  rosuvastatin (CRESTOR) 40 MG tablet TAKE 1 TABLET BY MOUTH EVERY DAY 11/27/21   Anabel Halon, MD  sulfamethoxazole-trimethoprim (BACTRIM DS) 800-160 MG tablet Take 1 tablet by mouth 2 (two) times daily. 01/24/22   Anabel Halon, MD  terbinafine (LAMISIL) 1 % cream Apply 1 Application topically 2 (two) times daily. 12/23/21   Anabel Halon, MD  traMADol (ULTRAM) 50 MG tablet Take 1 tablet (50 mg total) by mouth every 8 (eight) hours as needed. 07/29/21   Doreatha Massed, MD     Family History  Problem Relation Age of Onset   Heart attack Sister    Heart disease Sister    Heart attack Brother    Heart disease Brother    Throat cancer Brother    Colon cancer Neg Hx     Social History   Socioeconomic  History   Marital status: Widowed    Spouse name: Not on file   Number of children: Not on file   Years of education: Not on file   Highest education level: Not on file  Occupational History   Not on file  Tobacco Use   Smoking status: Never   Smokeless tobacco: Never  Vaping Use   Vaping Use: Never used  Substance and Sexual Activity   Alcohol use: No    Alcohol/week: 0.0 standard drinks of alcohol   Drug use: No   Sexual activity: Not on file  Other Topics Concern   Not on file  Social History Narrative   Not on file   Social Determinants of Health   Financial Resource Strain: Low Risk  (12/11/2021)  Overall Financial Resource Strain (CARDIA)    Difficulty of Paying Living Expenses: Not hard at all  Food Insecurity: No Food Insecurity (11/29/2021)   Hunger Vital Sign    Worried About Running Out of Food in the Last Year: Never true    Ran Out of Food in the Last Year: Never true  Transportation Needs: No Transportation Needs (12/11/2021)   PRAPARE - Administrator, Civil Service (Medical): No    Lack of Transportation (Non-Medical): No  Physical Activity: Insufficiently Active (09/25/2021)   Exercise Vital Sign    Days of Exercise per Week: 3 days    Minutes of Exercise per Session: 30 min  Stress: No Stress Concern Present (09/22/2020)   [employer] of Occupational Health - Occupational Stress Questionnaire    Feeling of Stress : Not at all  Social Connections: Moderately Isolated (09/22/2020)   Social Connection and Isolation Panel [NHANES]    Frequency of Communication with Friends and Family: More than three times a week    Frequency of Social Gatherings with Friends and Family: More than three times a week    Attends Religious Services: More than 4 times per year    Active Member of [employer] or Organizations: No    Attends Banker Meetings: Never    Marital Status: Widowed     Review of Systems: A 12 point ROS discussed and pertinent  positives are indicated in the HPI above.  All other systems are negative.  Review of Systems  Constitutional:  Positive for appetite change, fatigue and unexpected weight change. Negative for chills and fever.  Respiratory:  Positive for cough. Negative for shortness of breath.   Cardiovascular:  Negative for chest pain.  Gastrointestinal:  Negative for abdominal pain, nausea and vomiting.  Neurological:  Positive for weakness. Negative for dizziness and headaches.    Vital Signs: BP (!) 149/49   Pulse 68   Temp 98.4 F (36.9 C) (Oral)   Resp 18   Ht 5\' 6"  (1.676 m)   Wt 177 lb 14.6 oz (80.7 kg)   SpO2 94%   BMI 28.72 kg/m   Physical Exam Vitals reviewed.  Constitutional:      General: She is not in acute distress.    Appearance: Normal appearance.  HENT:     Head: Normocephalic.     Mouth/Throat:     Mouth: Mucous membranes are dry.     Pharynx: Oropharynx is clear. No oropharyngeal exudate or posterior oropharyngeal erythema.  Cardiovascular:     Rate and Rhythm: Normal rate and regular rhythm.     Pulses: Normal pulses.     Heart sounds: Normal heart sounds. No murmur heard. Pulmonary:     Effort: Pulmonary effort is normal.     Breath sounds: Normal breath sounds.  Abdominal:     General: There is no distension.     Palpations: Abdomen is soft.     Tenderness: There is no abdominal tenderness.  Musculoskeletal:     Right lower leg: No edema.     Left lower leg: No edema.  Skin:    General: Skin is warm and dry.  Neurological:     Mental Status: She is alert and oriented to person, place, and time.  Psychiatric:        Mood and Affect: Mood normal.        Behavior: Behavior normal.        Thought Content: Thought content normal.  Judgment: Judgment normal.          Imaging: DG HIP UNILAT WITH PELVIS 2-3 VIEWS LEFT  Result Date: 03/11/2022 CLINICAL DATA:  Stage IV breast cancer.  Bone pain. EXAM: DG HIP (WITH OR WITHOUT PELVIS) 2-3V LEFT  COMPARISON:  Bone scan dated 02/11/2022. FINDINGS: There is no acute fracture or dislocation. The bones are osteopenic. Innumerable scattered sclerotic lesions consistent with metastatic disease. Degenerative changes of the lower lumbar spine. The soft tissues are grossly unremarkable. IMPRESSION: 1. No acute fracture or dislocation. 2. Innumerable scattered sclerotic lesions consistent with metastatic disease. Electronically Signed   By: Elgie Collard M.D.   On: 03/11/2022 19:59   CT CHEST ABDOMEN PELVIS W CONTRAST  Result Date: 03/10/2022 CLINICAL DATA:  82 year old female with history of stage IV breast cancer. Follow-up study. EXAM: CT CHEST, ABDOMEN, AND PELVIS WITH CONTRAST TECHNIQUE: Multidetector CT imaging of the chest, abdomen and pelvis was performed following the standard protocol during bolus administration of intravenous contrast. RADIATION DOSE REDUCTION: This exam was performed according to the departmental dose-optimization program which includes automated exposure control, adjustment of the mA and/or kV according to patient size and/or use of iterative reconstruction technique. CONTRAST:  OMNIPAQUE IOHEXOL 300 MG/ML  SOLN COMPARISON:  Chest CT 62,023. CT of the chest, abdomen and pelvis 02/11/2021. FINDINGS: CT CHEST FINDINGS Cardiovascular: Heart size is normal. There is no significant pericardial fluid, thickening or pericardial calcification. There is aortic atherosclerosis, as well as atherosclerosis of the great vessels of the mediastinum and the coronary arteries, including calcified atherosclerotic plaque in the left main, left anterior descending, left circumflex and right coronary arteries. Calcifications of the aortic valve and mitral annulus. Mediastinum/Nodes: No pathologically enlarged mediastinal, hilar or internal mammary lymph nodes. Please note that accurate exclusion of hilar adenopathy is limited on noncontrast CT scans. Esophagus is unremarkable in appearance. No  axillary lymphadenopathy. Lungs/Pleura: New mass-like area in the posterior aspect of the right upper lobe (axial image 57 of series 4) measuring 3.1 x 1.7 cm, with some internal air bronchograms. Other clustered micro nodules in the right upper lobe best appreciated on axial image 46 of series 4 where the largest of these measures 5 mm, slightly more apparent than the prior examination. Widespread septal thickening with multiple other areas of nodularity, most evident in the inferior aspect of the right lung abutting the major fissure where there is nodularity on both sides of the lateral aspect of the right major fissure in the right lower and right middle lobes, largest of which measures 1.0 x 0.5 cm (axial image 89 of series 4). Small bilateral pleural effusions with areas of dependent subsegmental atelectasis in the lower lobes of the lungs bilaterally. There may also be some consolidation or infiltrative soft tissue in the medial aspect of the left lower lobe best appreciated on axial image 39 of series 2). Musculoskeletal: Status post right modified radical mastectomy. Mass-like architectural distortion in the left breast adjacent to focal area of skin retraction (axial image 39 of series 2) measuring 4.7 x 2.2 cm, similar to the prior examination, favored to represent an area of chronic post lumpectomy scarring. Widespread predominantly sclerotic lesions noted throughout the visualized axial and appendicular skeleton, similar to prior examinations, indicative of widespread metastatic disease to the bones. Healing nondisplaced fracture of the posterolateral left tenth rib noted. CT ABDOMEN PELVIS FINDINGS Hepatobiliary: No definite suspicious cystic or solid hepatic lesions are confidently identified on today's noncontrast CT examination. Status post cholecystectomy. Pancreas: No definite  pancreatic mass or peripancreatic fluid collections or inflammatory changes are noted on today's noncontrast CT  examination. Spleen: Small splenule inferior to the spleen. Spleen is otherwise unremarkable in appearance. Adrenals/Urinary Tract: Partially exophytic intermediate attenuation lesion in the lower pole of the left kidney measuring 1.5 cm in diameter, incompletely characterized on today's noncontrast CT examination, but stable compared to the prior study suggesting a benign lesion, potentially a proteinaceous/hemorrhagic cyst (no imaging follow-up recommended). Right kidney and bilateral adrenal glands are normal in appearance. No hydroureteronephrosis. Urinary bladder is unremarkable in appearance. Stomach/Bowel: Unenhanced appearance of the stomach is normal. There is no pathologic dilatation of small bowel or colon. Numerous colonic diverticuli are noted, particularly in the sigmoid colon, without surrounding inflammatory changes to indicate an acute diverticulitis at this time. The appendix is not confidently identified and may be surgically absent. Regardless, there are no inflammatory changes noted adjacent to the cecum to suggest the presence of an acute appendicitis at this time. Vascular/Lymphatic: Atherosclerotic calcifications in the abdominal aorta and pelvic vasculature. No lymphadenopathy noted in the abdomen or pelvis. Reproductive: Status post hysterectomy. Ovaries are not confidently identified may be surgically absent or atrophic. Other: No significant volume of ascites.  No pneumoperitoneum. Musculoskeletal: Innumerable mixed lytic and sclerotic lesions are noted throughout the visualized axial and appendicular skeleton, increased in number and size compared to the prior CT examination 02/11/2021, compatible with widespread metastatic disease to the bones. Among the largest of these lesions is a predominantly lytic lesion in the right ilium which currently measures up to 4.7 x 2.9 cm with associated soft tissue component (axial image 82 of series 2). IMPRESSION: 1. Today's study demonstrates  progression of widespread metastatic disease to the bones, as detailed above. 2. There also findings in the lungs concerning for lymphangitic spread of tumor, with numerous pulmonary nodules and masses, largest of which is in the posterior aspect of the right upper lobe measuring 3.1 x 1.7 cm. 3. Small bilateral pleural effusions (left-greater-than-right) with areas of passive atelectasis in the lower lobes of the lungs bilaterally. There may also be some focal airspace consolidation in the posteromedial left lower lobe (although infiltrative tumor in this region is not excluded). 4. No definitive findings of extra skeletal metastatic disease in the abdomen or pelvis. 5. Severe colonic diverticulosis without evidence of acute diverticulitis at this time. 6. Aortic atherosclerosis, in addition to left main and three-vessel coronary artery disease. 7. There are calcifications of the aortic valve and mitral annulus. Echocardiographic correlation for evaluation of potential valvular dysfunction may be warranted if clinically indicated. 8. Additional incidental findings, as above. Electronically Signed   By: Trudie Reed M.D.   On: 03/10/2022 10:01    Labs:  CBC: Recent Labs    12/26/21 0915 01/23/22 1348 02/24/22 0945 03/25/22 1003  WBC 1.8* 2.7* 2.0* 3.7*  HGB 10.4* 11.3* 10.9* 10.6*  HCT 32.1* 35.2* 33.4* 32.5*  PLT 117* 187 166 180    COAGS: No results for input(s): "INR", "APTT" in the last 8760 hours.  BMP: Recent Labs    12/01/21 0510 12/26/21 0915 01/23/22 1348 02/24/22 0945  NA 136 137 137 136  K 3.4* 3.5 3.9 3.9  CL 112* 111 106 108  CO2 20* 20* 21* 18*  GLUCOSE 103* 96 93 97  BUN 10 16 22 18   CALCIUM 7.9* 8.8* 9.2 8.8*  CREATININE 0.90 1.75* 1.38* 1.37*  GFRNONAA >60 29* 38* 38*    LIVER FUNCTION TESTS: Recent Labs    11/29/21 0243  12/26/21 0915 01/23/22 1348 02/24/22 0945  BILITOT 0.6 0.4 0.9 1.0  AST 23 32 55* 54*  ALT 16 12 19 12   ALKPHOS 146* 169* 154*  144*  PROT 6.3* 7.1 7.6 7.5  ALBUMIN 3.2* 3.8 4.0 4.0    TUMOR MARKERS: No results for input(s): "AFPTM", "CEA", "CA199", "CHROMGRNA" in the last 8760 hours.  Assessment and Plan:  82 y/o F with history of recurrent breast cancer s/p right mastectomy 1987 and left mastectomy 2007 recently found to have disease progression on Ibrance and anastrozole who presents today for port placement to begin systemic therapy.  Pt resting in bed.  She is A&O, calm and pleasant.  She is in no distress.   Risks and benefits of image-guided Port-a-catheter placement were discussed with the patient including, but not limited to bleeding, infection, pneumothorax, or fibrin sheath development and need for additional procedures.  All of the patient's questions were answered, patient is agreeable to proceed.  Consent signed and in chart.  Thank you for this interesting consult.  I greatly enjoyed meeting Robin [name] and look forward to participating in their care.  A copy of this report was sent to the requesting provider on this date.  Electronically Signed: Shon Hough, NP 03/26/2022, 10:49 AM   I spent a total of  30 Minutes  in face to face in clinical consultation, greater than 50% of which was counseling/coordinating care for port placement.

## 2022-03-26 ENCOUNTER — Other Ambulatory Visit: Payer: Self-pay

## 2022-03-26 ENCOUNTER — Ambulatory Visit (HOSPITAL_COMMUNITY)
Admission: RE | Admit: 2022-03-26 | Discharge: 2022-03-26 | Disposition: A | Discharge status: Home / Self Care | Payer: Medicare PPO | Source: Ambulatory Visit | Attending: Hematology | Admitting: Hematology

## 2022-03-26 DIAGNOSIS — C50919 Malignant neoplasm of unspecified site of unspecified female breast: Secondary | ICD-10-CM | POA: Diagnosis not present

## 2022-03-26 DIAGNOSIS — C7951 Secondary malignant neoplasm of bone: Secondary | ICD-10-CM

## 2022-03-26 DIAGNOSIS — C50912 Malignant neoplasm of unspecified site of left female breast: Secondary | ICD-10-CM | POA: Diagnosis not present

## 2022-03-26 DIAGNOSIS — Z452 Encounter for adjustment and management of vascular access device: Secondary | ICD-10-CM | POA: Diagnosis not present

## 2022-03-26 HISTORY — PX: IR IMAGING GUIDED PORT INSERTION: IMG5740

## 2022-03-26 MED ORDER — LIDOCAINE-EPINEPHRINE 1 %-1:100000 IJ SOLN
INTRAMUSCULAR | Status: AC
  Administered 2022-03-26: 18 mL
  Filled 2022-03-26: qty 1

## 2022-03-26 MED ORDER — SODIUM CHLORIDE 0.9 % IV SOLN: INTRAVENOUS | Status: DC

## 2022-03-26 MED ORDER — FENTANYL CITRATE (PF) 100 MCG/2ML IJ SOLN
INTRAMUSCULAR | Status: AC
  Filled 2022-03-26: qty 2

## 2022-03-26 MED ORDER — MIDAZOLAM HCL 2 MG/2ML IJ SOLN
INTRAMUSCULAR | Status: AC
  Filled 2022-03-26: qty 2

## 2022-03-26 MED ORDER — MIDAZOLAM HCL 2 MG/2ML IJ SOLN
INTRAMUSCULAR | Status: AC | PRN
  Administered 2022-03-26: 1 mg via INTRAVENOUS

## 2022-03-26 MED ORDER — HEPARIN SOD (PORK) LOCK FLUSH 100 UNIT/ML IV SOLN
INTRAVENOUS | Status: AC
  Filled 2022-03-26: qty 5

## 2022-03-26 MED ORDER — FENTANYL CITRATE (PF) 100 MCG/2ML IJ SOLN
INTRAMUSCULAR | Status: AC | PRN
  Administered 2022-03-26: 25 ug via INTRAVENOUS

## 2022-03-26 NOTE — Procedures (Signed)
Interventional Radiology Procedure Note ° °Procedure: Single Lumen Power Port Placement   ° °Access:  Right internal jugular vein ° °Findings: Catheter tip positioned at cavoatrial junction. Port is ready for immediate use.  ° °Complications: None ° °EBL: < 10 mL ° °Recommendations:  °- Ok to shower in 24 hours °- Do not submerge for 7 days °- Routine line care  ° ° °Boneta Standre, MD ° ° ° °

## 2022-04-02 ENCOUNTER — Encounter: Payer: Self-pay | Admitting: Radiology

## 2022-04-02 ENCOUNTER — Inpatient Hospital Stay: Payer: Medicare PPO

## 2022-04-02 ENCOUNTER — Ambulatory Visit (HOSPITAL_COMMUNITY)
Admission: RE | Admit: 2022-04-02 | Discharge: 2022-04-02 | Disposition: A | Discharge status: Home / Self Care | Payer: Medicare PPO | Source: Ambulatory Visit | Attending: Hematology | Admitting: Hematology

## 2022-04-02 DIAGNOSIS — C7951 Secondary malignant neoplasm of bone: Secondary | ICD-10-CM | POA: Diagnosis not present

## 2022-04-02 DIAGNOSIS — M25569 Pain in unspecified knee: Secondary | ICD-10-CM | POA: Diagnosis not present

## 2022-04-02 DIAGNOSIS — Z86718 Personal history of other venous thrombosis and embolism: Secondary | ICD-10-CM | POA: Diagnosis not present

## 2022-04-02 DIAGNOSIS — E039 Hypothyroidism, unspecified: Secondary | ICD-10-CM | POA: Diagnosis not present

## 2022-04-02 DIAGNOSIS — R630 Anorexia: Secondary | ICD-10-CM | POA: Diagnosis not present

## 2022-04-02 DIAGNOSIS — C50911 Malignant neoplasm of unspecified site of right female breast: Secondary | ICD-10-CM | POA: Diagnosis not present

## 2022-04-02 DIAGNOSIS — C7800 Secondary malignant neoplasm of unspecified lung: Secondary | ICD-10-CM | POA: Diagnosis not present

## 2022-04-02 DIAGNOSIS — Z79811 Long term (current) use of aromatase inhibitors: Secondary | ICD-10-CM | POA: Diagnosis not present

## 2022-04-02 DIAGNOSIS — J90 Pleural effusion, not elsewhere classified: Secondary | ICD-10-CM | POA: Diagnosis not present

## 2022-04-02 DIAGNOSIS — C50919 Malignant neoplasm of unspecified site of unspecified female breast: Secondary | ICD-10-CM | POA: Diagnosis not present

## 2022-04-02 DIAGNOSIS — R059 Cough, unspecified: Secondary | ICD-10-CM

## 2022-04-02 DIAGNOSIS — D649 Anemia, unspecified: Secondary | ICD-10-CM | POA: Diagnosis not present

## 2022-04-02 DIAGNOSIS — R110 Nausea: Secondary | ICD-10-CM

## 2022-04-02 DIAGNOSIS — J9811 Atelectasis: Secondary | ICD-10-CM | POA: Diagnosis not present

## 2022-04-02 MED ORDER — BENZONATATE 100 MG PO CAPS: 100.0000 mg | ORAL_CAPSULE | Freq: Three times a day (TID) | ORAL | 2 refills | Status: DC | PRN

## 2022-04-02 MED ORDER — LIDOCAINE-PRILOCAINE 2.5-2.5 % EX CREA: TOPICAL_CREAM | CUTANEOUS | 3 refills | Status: DC

## 2022-04-02 MED ORDER — PROCHLORPERAZINE MALEATE 5 MG PO TABS: 5.0000 mg | ORAL_TABLET | Freq: Four times a day (QID) | ORAL | 3 refills | Status: DC | PRN

## 2022-04-02 NOTE — Research (Signed)
[id number]: A RANDOMIZED TRIAL ADDRESSING CANCER-RELATED FINANCIAL HARDSHIP THROUGH DELIVERY OF A PROACTIVE FINANCIAL NAVIGATION INTERVENTION (CREDIT)   04/02/2022  CONSENT INTRO: Patient Robin [name] was identified by Dr. Delton Coombes as a potential candidate for the above listed study.  This Clinical Research Coordinator met with Robin [name], [id number], on 04/02/22 in a manner and location that ensures patient privacy to discuss participation in the above listed research study.  Patient is Accompanied by her daughters .  A copy of the informed consent document and separate HIPAA Authorization was provided to the patient.  Patient reads, speaks, and understands [place].   Patient was provided with the business card of this Coordinator and encouraged to contact the research team with any questions.  Approximately 10 minutes were spent with the patient reviewing the informed consent documents.  Patient was provided the option of taking informed consent documents home to review and was encouraged to review at their convenience with their support network, including other care providers. Patient took the consent documents home to review.  Thanked patient and her daughters for their time and consideration of the above mentioned study. Plan to meet with patient next week during her first infusion to discuss any potential questions/concerns or potential interest.

## 2022-04-02 NOTE — Research (Signed)
A Randomized pragmatic Chair-Based Home Exercise Intervention for Mitigating Cancer-Related Fatigue in Older Adults Undergoing Chemotherapy for Advanced Disease   04/02/22  CONSENT INTRO: Patient Robin [name] was identified by Dr. Delton Coombes as a potential candidate for the above listed study.  This Clinical Research Coordinator met with Robin [name], [id number], on 04/02/22 in a manner and location that ensures patient privacy to discuss participation in the above listed research study.  Patient is Accompanied by her daughters .  A copy of the informed consent document and separate HIPAA Authorization was provided to the patient.  Patient reads, speaks, and understands [place].   Patient was provided with the business card of this Coordinator and encouraged to contact the research team with any questions.  Approximately 10 minutes were spent with the patient reviewing the informed consent documents.  Patient was provided the option of taking informed consent documents home to review and was encouraged to review at their convenience with their support network, including other care providers. Patient took the consent documents home to review.  Thanked patient and her daughters for their time and consideration of the above mentioned study. Plan to meet with patient next week during her first infusion to discuss any potential questions/concerns or potential interest.    Carol Ada, RT(R)(T) Clinical Research Coordinator

## 2022-04-02 NOTE — Progress Notes (Signed)

## 2022-04-04 NOTE — Patient Instructions (Signed)

## 2022-04-07 ENCOUNTER — Other Ambulatory Visit: Payer: Self-pay

## 2022-04-07 ENCOUNTER — Encounter: Payer: Self-pay | Admitting: Nurse Practitioner

## 2022-04-07 ENCOUNTER — Ambulatory Visit: Payer: Medicare PPO | Admitting: Nurse Practitioner

## 2022-04-07 VITALS — BP 130/76 | HR 68 | Ht 66.0 in | Wt 177.0 lb

## 2022-04-07 DIAGNOSIS — E559 Vitamin D deficiency, unspecified: Secondary | ICD-10-CM

## 2022-04-07 DIAGNOSIS — E039 Hypothyroidism, unspecified: Secondary | ICD-10-CM | POA: Diagnosis not present

## 2022-04-07 DIAGNOSIS — C7951 Secondary malignant neoplasm of bone: Secondary | ICD-10-CM

## 2022-04-07 NOTE — Progress Notes (Signed)
Endocrinology follow-up note   Subjective:    Patient ID: Robin [name], female    DOB: 1940-12-23, PCP [name], MD Patient presents today for follow-up of hypothyroidism.    Past Medical History:  Diagnosis Date   Breast cancer ([place])    Tamoxifen started 2007   CAD (coronary artery disease)    DES second diagonal 2/12   Cardiomyopathy    Probable Takotsubo, LVEF 30-35% 2/12   Carotid artery disease ([facility])    Essential hypertension    Hepatic steatosis    Hyperlipidemia    Hypothyroidism    NSTEMI (non-ST elevated myocardial infarction) ([place])    2/12   Pancreas divisum    Stroke ([place])    (2000) residual left-sided weakness   Type 2 diabetes mellitus ([place])    reports not currently diabetic   Past Surgical History:  Procedure Laterality Date   ABDOMINAL HYSTERECTOMY     CAROTID ENDARTERECTOMY     Left   CHOLECYSTECTOMY     COLONOSCOPY WITH PROPOFOL N/A 05/03/2020   Procedure: COLONOSCOPY WITH PROPOFOL;  Surgeon: Daneil Dolin, MD;  [facility];  Service: Endoscopy;  Laterality: N/A;   ESOPHAGOGASTRODUODENOSCOPY (EGD) WITH PROPOFOL N/A 05/02/2020   Procedure: ESOPHAGOGASTRODUODENOSCOPY (EGD) WITH PROPOFOL;  Surgeon: Rogene Houston, MD;  [facility];  Service: Endoscopy;  Laterality: N/A;   GIVENS CAPSULE STUDY N/A 05/04/2020   Procedure: GIVENS CAPSULE STUDY;  Surgeon: Harvel Quale, MD;  [facility];  Service: Gastroenterology;  Laterality: N/A;   IR IMAGING GUIDED PORT INSERTION  03/26/2022   JOINT REPLACEMENT     Right knee [facility]- [facility]   MASTECTOMY     Bilateral   POLYPECTOMY  05/03/2020   Procedure: POLYPECTOMY;  Surgeon: Daneil Dolin, MD;  [facility];  Service: Endoscopy;;   Social History   Socioeconomic History   Marital status: Widowed    Spouse name: Not on file   Number of children: Not on file   Years of education: Not on file   Highest education level:  Not on file  Occupational History   Not on file  Tobacco Use   Smoking status: Never   Smokeless tobacco: Never  Vaping Use   Vaping Use: Never used  Substance and Sexual Activity   Alcohol use: No    Alcohol/week: 0.0 standard drinks of alcohol   Drug use: No   Sexual activity: Not on file  Other Topics Concern   Not on file  Social History Narrative   Not on file   Social Determinants of Health   Financial Resource Strain: Low Risk  (12/11/2021)   Overall Financial Resource Strain (CARDIA)    Difficulty of Paying Living Expenses: Not hard at all  Food Insecurity: No Food Insecurity (11/29/2021)   Hunger Vital Sign    Worried About Running Out of Food in the Last Year: Never true    Ran Out of Food in the Last Year: Never true  Transportation Needs: No Transportation Needs (12/11/2021)   PRAPARE - Hydrologist (Medical): No    Lack of Transportation (Non-Medical): No  Physical Activity: Insufficiently Active (09/25/2021)   Exercise Vital Sign    Days of Exercise per Week: 3 days    Minutes of Exercise per Session: 30 min  Stress: No Stress Concern Present (09/22/2020)   [place]    Feeling  of Stress : Not at all  Social Connections: Moderately Isolated (09/22/2020)   Social Connection and Isolation Panel [NHANES]    Frequency of Communication with Friends and Family: More than three times a week    Frequency of Social Gatherings with Friends and Family: More than three times a week    Attends Religious Services: More than 4 times per year    Active Member of [employer] or Organizations: No    Attends Archivist Meetings: Never    Marital Status: Widowed   Outpatient Encounter Medications as of 04/07/2022  Medication Sig   acetaminophen (TYLENOL) 325 MG tablet Take 2 tablets (650 mg total) by mouth every 6 (six) hours as needed for mild pain, fever or headache (or Fever >/=  101). (Patient taking differently: Take 325 mg by mouth every 6 (six) hours as needed for mild pain, fever or headache (or Fever >/= 101).)   amLODipine (NORVASC) 10 MG tablet TAKE 1 TABLET BY MOUTH EVERY DAY   anastrozole (ARIMIDEX) 1 MG tablet TAKE 1 TABLET BY MOUTH EVERY DAY   benzonatate (TESSALON) 100 MG capsule Take 1 capsule (100 mg total) by mouth 3 (three) times daily as needed for cough.   carvedilol (COREG) 12.5 MG tablet TAKE 1 TABLET (12.5MG  TOTAL) BY MOUTH TWICE A DAY WITH MEALS (Patient taking differently: Take 12.5 mg by mouth 2 (two) times daily with a meal.)   cetirizine (ZYRTEC) 10 MG tablet Take 1 tablet (10 mg total) by mouth daily.   Cholecalciferol (VITAMIN D3) 125 MCG (5000 UT) CAPS TAKE 1 CAPSULE BY MOUTH ONCE DAILY (Patient taking differently: Take 1 capsule by mouth daily.)   dicyclomine (BENTYL) 10 MG capsule Take 1 capsule (10 mg total) by mouth 3 (three) times daily as needed for spasms.   dronabinol (MARINOL) 5 MG capsule Take 1 capsule (5 mg total) by mouth 2 (two) times daily before a meal.   ELIQUIS 5 MG TABS tablet TAKE 1 TABLET BY MOUTH TWICE A DAY   [START ON 04/10/2022] Fam-Trastuzumab Deruxtec-nxki (ENHERTU IV) Inject into the vein every 21 ( twenty-one) days.   ferrous sulfate 325 (65 FE) MG tablet Take 1 tablet (325 mg total) by mouth daily with breakfast.   fluticasone (FLONASE) 50 MCG/ACT nasal spray Place 2 sprays into both nostrils daily.   furosemide (LASIX) 20 MG tablet Take 1 tablet (20 mg total) by mouth daily as needed for fluid or edema.   gabapentin (NEURONTIN) 100 MG capsule Take 1 capsule (100 mg total) by mouth at bedtime.   isosorbide mononitrate (IMDUR) 30 MG 24 hr tablet TAKE 1 TABLET BY MOUTH EVERY DAY   KLOR-CON M10 10 MEQ tablet TAKE 1 TABLET BY MOUTH EVERY DAY   levothyroxine (SYNTHROID) 150 MCG tablet TAKE 1 TABLET BY MOUTH EVERY DAY BEFORE BREAKFAST   lidocaine-prilocaine (EMLA) cream Apply a quarter-sized amount to port a cath site and  cover with plastic wrap 1 hour prior to infusion appointments   Misc. Devices MISC Please provide with left upper extremity lymphedema sleeve   Omega-3 Fatty Acids 300 MG CAPS Take 1 capsule (300 mg total) by mouth 2 (two) times daily. (Patient taking differently: Take 1 capsule by mouth daily.)   prochlorperazine (COMPAZINE) 5 MG tablet Take 1 tablet (5 mg total) by mouth every 6 (six) hours as needed for nausea or vomiting.   rosuvastatin (CRESTOR) 40 MG tablet TAKE 1 TABLET BY MOUTH EVERY DAY   terbinafine (LAMISIL) 1 % cream Apply 1 Application  topically 2 (two) times daily.   traMADol (ULTRAM) 50 MG tablet Take 1 tablet (50 mg total) by mouth every 8 (eight) hours as needed.   [DISCONTINUED] pantoprazole (PROTONIX) 40 MG tablet Take 1 tablet (40 mg total) by mouth daily.   nitroGLYCERIN (NITROSTAT) 0.4 MG SL tablet Place 1 tablet (0.4 mg total) under the tongue every 5 (five) minutes x 3 doses as needed. (Patient not taking: Reported on 03/25/2022)   No facility-administered encounter medications on file as of 04/07/2022.   ALLERGIES: Allergies  Allergen Reactions   Codeine Nausea And Vomiting    VACCINATION STATUS: Immunization History  Administered Date(s) Administered   Fluad Quad(high Dose 65+) 10/12/2018, 11/04/2019, 11/05/2020, 10/24/2021   Influenza, High Dose Seasonal PF 11/17/2016, 12/28/2017   Influenza,inj,Quad PF,6+ Mos 10/31/2014, 11/20/2015   Moderna Sars-Covid-2 Vaccination 06/10/2019, 07/08/2019   PNEUMOCOCCAL CONJUGATE-20 03/04/2022   Pneumococcal Conjugate-13 12/01/2012    Thyroid Problem Presents for follow-up visit. Symptoms include fatigue and hoarse voice. Patient reports no anxiety, cold intolerance, constipation, depressed mood, diarrhea, heat intolerance, palpitations, tremors, weight gain or weight loss. The symptoms have been stable.   - 32-yr old female patient with medical history as above. She is being seen in follow-up for hypothyroidism with repeat  thyroid function test.   -she is currently on Synthroid 150 mcg p.o. every morning (recently increased by Dr. Delton Coombes based on recent labs).  She has complaints of hoarseness, decreased appetite, sweet taste in her mouth, coughing up phlegm.  - She denies heat/cold intolerance. She denies palpitations nor tremors.   She denies any history of goiter. She has family history of thyroid dysfunction in one of her daughters. She denies any family history of thyroid cancer.  -She reports compliance to her thyroid hormone.   Review of systems  Constitutional: + Minimally fluctuating body weight,  current Body mass index is 28.57 kg/m. , + fatigue, no subjective hyperthermia, no subjective hypothermia, + decreased appetite Eyes: no blurry vision, no xerophthalmia ENT: no sore throat, no nodules palpated in throat, no dysphagia/odynophagia, no hoarseness Cardiovascular: no chest pain, no shortness of breath, no palpitations, no leg swelling Respiratory: + cough (phlegm per patient daughter), no shortness of breath Gastrointestinal: no nausea/vomiting/diarrhea Musculoskeletal: no muscle/joint aches, walks with cane and assistance from daughter Skin: no rashes, no hyperemia, swelling to left upper extremity (started over weekend) Neurological: no tremors, no numbness, no tingling, no dizziness Psychiatric: no depression, no anxiety  Objective:    BP 130/76 (BP Location: Right Arm, Patient Position: Sitting, Cuff Size: Normal)   Pulse 68   Ht 5\' 6"  (1.676 m)   Wt 177 lb (80.3 kg)   BMI 28.57 kg/m   Wt Readings from Last 3 Encounters:  04/07/22 177 lb (80.3 kg)  03/26/22 177 lb 14.6 oz (80.7 kg)  03/25/22 177 lb 14.6 oz (80.7 kg)    BP Readings from Last 3 Encounters:  04/07/22 130/76  03/26/22 138/63  03/25/22 (!) 141/59    Physical Exam- Limited  Constitutional:  Body mass index is 28.57 kg/m. , not in acute distress, normal state of mind Eyes:  EOMI, no exophthalmos Neck:  Supple Thyroid: No gross goiter Musculoskeletal: WC bound for long distances due to deconditioning Skin:  no rashes, no hyperemia Neurological: no tremor with outstretched hands    Recent Results (from the past 2160 hour(s))  Cancer antigen 27.29     Status: Abnormal   Collection Time: 01/23/22  1:48 PM  Result Value Ref Range   CA  27.29 285.5 (H) 0.0 - 38.6 U/mL    Comment: (NOTE) Siemens Centaur Immunochemiluminometric Methodology [facility]) Values obtained with different assay methods or kits cannot be used interchangeably. Results cannot be interpreted as absolute evidence of the presence or absence of malignant disease. Performed At: [facility] [place], [place] [id number] Rush Farmer MD [id number]   Cancer antigen 15-3     Status: Abnormal   Collection Time: 01/23/22  1:48 PM  Result Value Ref Range   CA 15-3 165.0 (H) 0.0 - 25.0 U/mL    Comment: (NOTE) Roche Diagnostics Electrochemiluminescence Immunoassay (ECLIA) Values obtained with different assay methods or kits cannot be used interchangeably.  Results cannot be interpreted as absolute evidence of the presence or absence of malignant disease. Performed At: [facility] [place], [place] [id number] Rush Farmer MD [id number]   Magnesium     Status: None   Collection Time: 01/23/22  1:48 PM  Result Value Ref Range   Magnesium 2.1 1.7 - 2.4 mg/dL    Comment: Performed at [facility], [address] [place], [place] [address]  Comprehensive metabolic panel     Status: Abnormal   Collection Time: 01/23/22  1:48 PM  Result Value Ref Range   Sodium 137 135 - 145 mmol/L   Potassium 3.9 3.5 - 5.1 mmol/L   Chloride 106 98 - 111 mmol/L   CO2 21 (L) 22 - 32 mmol/L   Glucose, Bld 93 70 - 99 mg/dL    Comment: Glucose reference range applies only to samples taken after fasting for at least 8 hours.   BUN 22 8 - 23 mg/dL   Creatinine, Ser 1.38 (H) 0.44 - 1.00 mg/dL    Calcium 9.2 8.9 - 10.3 mg/dL   Total Protein 7.6 6.5 - 8.1 g/dL   Albumin 4.0 3.5 - 5.0 g/dL   AST 55 (H) 15 - 41 U/L   ALT 19 0 - 44 U/L   Alkaline Phosphatase 154 (H) 38 - 126 U/L   Total Bilirubin 0.9 0.3 - 1.2 mg/dL   GFR, Estimated 38 (L) >60 mL/min    Comment: (NOTE) Calculated using the CKD-EPI Creatinine Equation (2021)    Anion gap 10 5 - 15    Comment: Performed at [facility], [address], [place], [place] [address]  CBC with Differential     Status: Abnormal   Collection Time: 01/23/22  1:48 PM  Result Value Ref Range   WBC 2.7 (L) 4.0 - 10.5 K/uL   RBC 3.92 3.87 - 5.11 MIL/uL   Hemoglobin 11.3 (L) 12.0 - 15.0 g/dL   HCT 35.2 (L) 36.0 - 46.0 %   MCV 89.8 80.0 - 100.0 fL   MCH 28.8 26.0 - 34.0 pg   MCHC 32.1 30.0 - 36.0 g/dL   RDW 22.0 (H) 11.5 - 15.5 %   Platelets 187 150 - 400 K/uL   nRBC 0.0 0.0 - 0.2 %   Neutrophils Relative % 63 %   Neutro Abs 1.7 1.7 - 7.7 K/uL   Lymphocytes Relative 28 %   Lymphs Abs 0.8 0.7 - 4.0 K/uL   Monocytes Relative 7 %   Monocytes Absolute 0.2 0.1 - 1.0 K/uL   Eosinophils Relative 2 %   Eosinophils Absolute 0.1 0.0 - 0.5 K/uL   Basophils Relative 0 %   Basophils Absolute 0.0 0.0 - 0.1 K/uL   WBC Morphology REACTIVE LYMPHS    RBC Morphology POLYCHROMASIA    Smear Review MORPHOLOGY  UNREMARKABLE    Immature Granulocytes 0 %   Abs Immature Granulocytes 0.01 0.00 - 0.07 K/uL   Reactive, Benign Lymphocytes PRESENT    Polychromasia PRESENT     Comment: Performed at [facility], [address] [place], [place] [address]  Magnesium     Status: None   Collection Time: 02/24/22  9:45 AM  Result Value Ref Range   Magnesium 2.2 1.7 - 2.4 mg/dL    Comment: Performed at [facility], [address], [place], [place] [address]  Comprehensive metabolic panel     Status: Abnormal   Collection Time: 02/24/22  9:45 AM  Result Value Ref Range   Sodium 136 135 - 145 mmol/L   Potassium 3.9 3.5 - 5.1 mmol/L   Chloride 108 98 - 111 mmol/L    CO2 18 (L) 22 - 32 mmol/L   Glucose, Bld 97 70 - 99 mg/dL    Comment: Glucose reference range applies only to samples taken after fasting for at least 8 hours.   BUN 18 8 - 23 mg/dL   Creatinine, Ser 1.37 (H) 0.44 - 1.00 mg/dL   Calcium 8.8 (L) 8.9 - 10.3 mg/dL   Total Protein 7.5 6.5 - 8.1 g/dL   Albumin 4.0 3.5 - 5.0 g/dL   AST 54 (H) 15 - 41 U/L   ALT 12 0 - 44 U/L   Alkaline Phosphatase 144 (H) 38 - 126 U/L   Total Bilirubin 1.0 0.3 - 1.2 mg/dL   GFR, Estimated 38 (L) >60 mL/min    Comment: (NOTE) Calculated using the CKD-EPI Creatinine Equation (2021)    Anion gap 10 5 - 15    Comment: Performed at [facility], [address], [place], [place] [address]  CBC with Differential     Status: Abnormal   Collection Time: 02/24/22  9:45 AM  Result Value Ref Range   WBC 2.0 (L) 4.0 - 10.5 K/uL   RBC 3.71 (L) 3.87 - 5.11 MIL/uL   Hemoglobin 10.9 (L) 12.0 - 15.0 g/dL   HCT 33.4 (L) 36.0 - 46.0 %   MCV 90.0 80.0 - 100.0 fL   MCH 29.4 26.0 - 34.0 pg   MCHC 32.6 30.0 - 36.0 g/dL   RDW 20.3 (H) 11.5 - 15.5 %   Platelets 166 150 - 400 K/uL   nRBC 0.0 0.0 - 0.2 %   Neutrophils Relative % 63 %   Neutro Abs 1.3 (L) 1.7 - 7.7 K/uL   Lymphocytes Relative 26 %   Lymphs Abs 0.5 (L) 0.7 - 4.0 K/uL   Monocytes Relative 7 %   Monocytes Absolute 0.2 0.1 - 1.0 K/uL   Eosinophils Relative 2 %   Eosinophils Absolute 0.0 0.0 - 0.5 K/uL   Basophils Relative 1 %   Basophils Absolute 0.0 0.0 - 0.1 K/uL   Immature Granulocytes 1 %   Abs Immature Granulocytes 0.01 0.00 - 0.07 K/uL    Comment: Performed at [facility], [address], [place], [place] [address]  CBC     Status: Abnormal   Collection Time: 03/25/22 10:03 AM  Result Value Ref Range   WBC 3.7 (L) 4.0 - 10.5 K/uL   RBC 3.52 (L) 3.87 - 5.11 MIL/uL   Hemoglobin 10.6 (L) 12.0 - 15.0 g/dL   HCT 32.5 (L) 36.0 - 46.0 %   MCV 92.3 80.0 - 100.0 fL   MCH 30.1 26.0 - 34.0 pg   MCHC 32.6 30.0 - 36.0 g/dL   RDW 20.4 (H)  11.5 - 15.5 %    Platelets 180 150 - 400 K/uL   nRBC 0.8 (H) 0.0 - 0.2 %    Comment: Performed at [facility], [address], [place], [place] [address]  TSH     Status: Abnormal   Collection Time: 03/25/22 10:05 AM  Result Value Ref Range   TSH 11.538 (H) 0.350 - 4.500 uIU/mL    Comment: Performed by a 3rd Generation assay with a functional sensitivity of <=0.01 uIU/mL. Performed at [facility], [address] [place], [place] [address]   T4, free     Status: None   Collection Time: 03/25/22 10:05 AM  Result Value Ref Range   Free T4 0.90 0.61 - 1.12 ng/dL    Comment: (NOTE) Biotin ingestion may interfere with free T4 tests. If the results are inconsistent with the TSH level, previous test results, or the clinical presentation, then consider biotin interference. If needed, order repeat testing after stopping biotin. Performed at [facility], [place] [address], [place], [place] [address]       Diabetic Labs (most recent): Lab Results  Component Value Date   HGBA1C 5.9 (H) 03/01/2020   HGBA1C 5.8 (H) 11/04/2019   HGBA1C 5.8 (H) 05/18/2019    Latest Reference Range & Units 03/29/20 14:16 07/19/20 16:50 06/24/21 15:39 11/29/21 02:43 03/25/22 10:05  TSH 0.350 - 4.500 uIU/mL 10.600 (H) 0.173 (L) 0.694 5.715 (H) 11.538 (H)  T4,Free(Direct) 0.61 - 1.12 ng/dL 0.90 1.91 (H) 1.56  0.90  (H): Data is abnormally high (L): Data is abnormally low   Assessment & Plan:   1. Hypothyroidism -Her previsit thyroid function tests are consistent with slight under-replacement.  She is advised to continue Levothyroxine 150 mcg po daily before breakfast as recently ordered by Dr. Delton Coombes.  Her weight based maximum dose should be around 128 mcg which means she may have some type of absorption issue.  She has been taking her supplements properly to maximize absorption.   - We discussed about correct intake of levothyroxine, at fasting, with water, separated by at least 30 minutes from breakfast, and  separated by more than 4 hours from calcium, iron, multivitamins, acid reflux medications (PPIs). -Patient is made aware of the fact that thyroid hormone replacement is needed for life, dose to be adjusted by periodic monitoring of thyroid function tests.    2. Vitamin D Deficiency There is no recent vitamin D level to review.  Will recheck prior to next visit.   I encouraged her to reach out to her PCP regarding her coughing up phlegm and decreased appetite.  I do not think her thyroid is the main cause of her symptoms   I spent  40  minutes in the care of the patient today including review of labs from Thyroid Function, CMP, and other relevant labs ; imaging/biopsy records (current and previous including abstractions from other facilities); face-to-face time discussing  her lab results and symptoms, medications doses, her options of short and long term treatment based on the latest standards of care / guidelines;   and documenting the encounter.  Robin [name]  participated in the discussions, expressed understanding, and voiced agreement with the above plans.  All questions were answered to her satisfaction. she is encouraged to contact clinic should she have any questions or concerns prior to her return visit.  Follow up plan: Return in about 5 weeks (around 05/12/2022) for Thyroid follow up, Previsit labs.  Rayetta Pigg, [facility] [phone]  [address] [place], [place] [address] Phone: [phone] Fax: [phone]   04/07/2022, 4:31 PM

## 2022-04-08 ENCOUNTER — Emergency Department (HOSPITAL_COMMUNITY): Payer: Medicare PPO

## 2022-04-08 ENCOUNTER — Other Ambulatory Visit: Payer: Self-pay

## 2022-04-08 ENCOUNTER — Emergency Department (HOSPITAL_COMMUNITY)
Admission: EM | Admit: 2022-04-08 | Discharge: 2022-04-09 | Disposition: A | Discharge status: Home / Self Care | Payer: Medicare PPO | Attending: Emergency Medicine | Admitting: Emergency Medicine

## 2022-04-08 ENCOUNTER — Telehealth: Payer: Self-pay | Admitting: *Deleted

## 2022-04-08 ENCOUNTER — Encounter (HOSPITAL_COMMUNITY): Payer: Self-pay

## 2022-04-08 DIAGNOSIS — E119 Type 2 diabetes mellitus without complications: Secondary | ICD-10-CM | POA: Insufficient documentation

## 2022-04-08 DIAGNOSIS — Z1152 Encounter for screening for COVID-19: Secondary | ICD-10-CM | POA: Insufficient documentation

## 2022-04-08 DIAGNOSIS — I2510 Atherosclerotic heart disease of native coronary artery without angina pectoris: Secondary | ICD-10-CM | POA: Insufficient documentation

## 2022-04-08 DIAGNOSIS — R630 Anorexia: Secondary | ICD-10-CM | POA: Diagnosis not present

## 2022-04-08 DIAGNOSIS — Z8673 Personal history of transient ischemic attack (TIA), and cerebral infarction without residual deficits: Secondary | ICD-10-CM | POA: Insufficient documentation

## 2022-04-08 DIAGNOSIS — R1084 Generalized abdominal pain: Secondary | ICD-10-CM | POA: Diagnosis not present

## 2022-04-08 DIAGNOSIS — R059 Cough, unspecified: Secondary | ICD-10-CM | POA: Diagnosis not present

## 2022-04-08 DIAGNOSIS — R051 Acute cough: Secondary | ICD-10-CM | POA: Diagnosis not present

## 2022-04-08 DIAGNOSIS — Z79899 Other long term (current) drug therapy: Secondary | ICD-10-CM | POA: Diagnosis not present

## 2022-04-08 DIAGNOSIS — Z7901 Long term (current) use of anticoagulants: Secondary | ICD-10-CM | POA: Insufficient documentation

## 2022-04-08 DIAGNOSIS — R079 Chest pain, unspecified: Secondary | ICD-10-CM | POA: Diagnosis not present

## 2022-04-08 DIAGNOSIS — N2889 Other specified disorders of kidney and ureter: Secondary | ICD-10-CM | POA: Diagnosis not present

## 2022-04-08 DIAGNOSIS — I10 Essential (primary) hypertension: Secondary | ICD-10-CM | POA: Insufficient documentation

## 2022-04-08 DIAGNOSIS — J90 Pleural effusion, not elsewhere classified: Secondary | ICD-10-CM | POA: Diagnosis not present

## 2022-04-08 LAB — CBC WITH DIFFERENTIAL/PLATELET
Abs Immature Granulocytes: 0.14 10*3/uL — ABNORMAL HIGH (ref 0.00–0.07)
Basophils Absolute: 0.1 10*3/uL (ref 0.0–0.1)
Basophils Relative: 1 %
Eosinophils Absolute: 0.1 10*3/uL (ref 0.0–0.5)
Eosinophils Relative: 1 %
HCT: 36.6 % (ref 36.0–46.0)
Hemoglobin: 11.7 g/dL — ABNORMAL LOW (ref 12.0–15.0)
Immature Granulocytes: 2 %
Lymphocytes Relative: 15 %
Lymphs Abs: 0.9 10*3/uL (ref 0.7–4.0)
MCH: 30.1 pg (ref 26.0–34.0)
MCHC: 32 g/dL (ref 30.0–36.0)
MCV: 94.1 fL (ref 80.0–100.0)
Monocytes Absolute: 0.8 10*3/uL (ref 0.1–1.0)
Monocytes Relative: 13 %
Neutro Abs: 4.3 10*3/uL (ref 1.7–7.7)
Neutrophils Relative %: 68 %
Platelets: 194 10*3/uL (ref 150–400)
RBC: 3.89 MIL/uL (ref 3.87–5.11)
RDW: 19.9 % — ABNORMAL HIGH (ref 11.5–15.5)
WBC: 6.3 10*3/uL (ref 4.0–10.5)
nRBC: 1.4 % — ABNORMAL HIGH (ref 0.0–0.2)

## 2022-04-08 LAB — LIPASE, BLOOD: Lipase: 28 U/L (ref 11–51)

## 2022-04-08 LAB — URINALYSIS, ROUTINE W REFLEX MICROSCOPIC
Bilirubin Urine: NEGATIVE
Glucose, UA: NEGATIVE mg/dL
Ketones, ur: NEGATIVE mg/dL
Leukocytes,Ua: NEGATIVE
Nitrite: NEGATIVE
Protein, ur: 30 mg/dL — AB
Specific Gravity, Urine: <1.005 — ABNORMAL LOW (ref 1.005–1.030)
pH: 7 (ref 5.0–8.0)

## 2022-04-08 LAB — COMPREHENSIVE METABOLIC PANEL
ALT: 32 U/L (ref 0–44)
AST: 133 U/L — ABNORMAL HIGH (ref 15–41)
Albumin: 3.9 g/dL (ref 3.5–5.0)
Alkaline Phosphatase: 181 U/L — ABNORMAL HIGH (ref 38–126)
Anion gap: 14 (ref 5–15)
BUN: 15 mg/dL (ref 8–23)
CO2: 21 mmol/L — ABNORMAL LOW (ref 22–32)
Calcium: 9.7 mg/dL (ref 8.9–10.3)
Chloride: 100 mmol/L (ref 98–111)
Creatinine, Ser: 1.18 mg/dL — ABNORMAL HIGH (ref 0.44–1.00)
GFR, Estimated: 46 mL/min — ABNORMAL LOW (ref >60)
Glucose, Bld: 85 mg/dL (ref 70–99)
Potassium: 3.5 mmol/L (ref 3.5–5.1)
Sodium: 135 mmol/L (ref 135–145)
Total Bilirubin: 1.1 mg/dL (ref 0.3–1.2)
Total Protein: 7.8 g/dL (ref 6.5–8.1)

## 2022-04-08 LAB — RESP PANEL BY RT-PCR (RSV, FLU A&B, COVID)  RVPGX2
Influenza A by PCR: NEGATIVE
Influenza B by PCR: NEGATIVE
Resp Syncytial Virus by PCR: NEGATIVE
SARS Coronavirus 2 by RT PCR: NEGATIVE

## 2022-04-08 LAB — TROPONIN I (HIGH SENSITIVITY)
Troponin I (High Sensitivity): 27 ng/L — ABNORMAL HIGH (ref <18)
Troponin I (High Sensitivity): 30 ng/L — ABNORMAL HIGH (ref <18)

## 2022-04-08 LAB — URINALYSIS, MICROSCOPIC (REFLEX)

## 2022-04-08 LAB — MAGNESIUM: Magnesium: 2.2 mg/dL (ref 1.7–2.4)

## 2022-04-08 MED ORDER — LACTATED RINGERS IV BOLUS
1000.0000 mL | Freq: Once | INTRAVENOUS | Status: AC
  Administered 2022-04-08: 1000 mL via INTRAVENOUS

## 2022-04-08 MED ORDER — SODIUM CHLORIDE 0.9 % IV SOLN
500.0000 mg | Freq: Once | INTRAVENOUS | Status: AC
  Administered 2022-04-09: 500 mg via INTRAVENOUS
  Filled 2022-04-08: qty 5

## 2022-04-08 MED ORDER — FENTANYL CITRATE PF 50 MCG/ML IJ SOSY
50.0000 ug | PREFILLED_SYRINGE | Freq: Once | INTRAMUSCULAR | Status: AC
  Administered 2022-04-08: 50 ug via INTRAVENOUS
  Filled 2022-04-08: qty 1

## 2022-04-08 MED ORDER — SODIUM CHLORIDE 0.9 % IV SOLN
1.0000 g | Freq: Once | INTRAVENOUS | Status: AC
  Administered 2022-04-08: 1 g via INTRAVENOUS
  Filled 2022-04-08: qty 10

## 2022-04-08 MED ORDER — IOHEXOL 350 MG/ML SOLN
80.0000 mL | Freq: Once | INTRAVENOUS | Status: AC | PRN
  Administered 2022-04-08: 80 mL via INTRAVENOUS

## 2022-04-08 NOTE — ED Notes (Signed)
Pt transported to CT at this time.

## 2022-04-08 NOTE — ED Provider Notes (Signed)
[facility] Provider Note   CSN: [id number] Arrival date & time: 04/08/22  1607     History {Add pertinent medical, surgical, social history, OB history to HPI:1} Chief Complaint  Patient presents with   Abdominal Pain    Robin [name] is a 82 y.o. female.   Abdominal Pain Associated symptoms: cough, fatigue and nausea   Patient presents for abdominal pain.  Medical history includes metastatic breast cancer, T2DM, HTN, CVA, CAD.  Risk cancer was initially diagnosed in 54.  She has undergone mastectomy.  She had an irregular mass identified 2 years ago.  Biopsy showed recurrence of cancer.  PET scan to undergo showed metastatic bone disease.  She was started on anastrozole.  She declined initiation of palbociclib.  She was started on Ibrance a year ago but discontinued this 2 weeks ago.  Current plan is for placement of port and then initiation of Enhertu.  She has had a port placed.  First dose of her new chemotherapy will be on Thursday.  She has had prior DVT and is on Eliquis.  Patient reports abdominal pain has been ongoing for the past several weeks.  At times, she will feel that she has abdominal distention.  She has had continued normal bowel movements, most recently of which was 2 days ago.  She has had very poor p.o. intake due to loss of appetite.  She states that everything she tries to eat taste very sweet.  When she does try to eat or drink, she will have coughing and phlegm production.  She has had some posttussive emesis episodes.  She denies current nausea.  For her abdominal pain, she has been taking Tylenol only.  Abdominal pain is generalized.     Home Medications Prior to Admission medications   Medication Sig Start Date End Date Taking? Authorizing Provider  acetaminophen (TYLENOL) 325 MG tablet Take 2 tablets (650 mg total) by mouth every 6 (six) hours as needed for mild pain, fever or headache (or Fever >/=  101). Patient taking differently: Take 325 mg by mouth every 6 (six) hours as needed for mild pain, fever or headache (or Fever >/= 101). 04/06/20   Roxan Hockey, MD  amLODipine (NORVASC) 10 MG tablet TAKE 1 TABLET BY MOUTH EVERY DAY 03/11/22   [name], MD  anastrozole (ARIMIDEX) 1 MG tablet TAKE 1 TABLET BY MOUTH EVERY DAY 12/23/21   Derek Jack, MD  benzonatate (TESSALON) 100 MG capsule Take 1 capsule (100 mg total) by mouth 3 (three) times daily as needed for cough. 04/02/22   Derek Jack, MD  carvedilol (COREG) 12.5 MG tablet TAKE 1 TABLET (12.5MG  TOTAL) BY MOUTH TWICE A DAY WITH MEALS Patient taking differently: Take 12.5 mg by mouth 2 (two) times daily with a meal. 11/18/21   Posey Pronto, Colin Broach, MD  cetirizine (ZYRTEC) 10 MG tablet Take 1 tablet (10 mg total) by mouth daily. 05/07/21   [name], MD  Cholecalciferol (VITAMIN D3) 125 MCG (5000 UT) CAPS TAKE 1 CAPSULE BY MOUTH ONCE DAILY Patient taking differently: Take 1 capsule by mouth daily. 01/14/18   Cassandria Anger, MD  dicyclomine (BENTYL) 10 MG capsule Take 1 capsule (10 mg total) by mouth 3 (three) times daily as needed for spasms. 03/04/22   [name], MD  dronabinol (MARINOL) 5 MG capsule Take 1 capsule (5 mg total) by mouth 2 (two) times daily before a meal. 03/25/22   Derek Jack, MD  ELIQUIS 5 MG TABS tablet TAKE 1 TABLET BY MOUTH TWICE A DAY 03/11/22   Derek Jack, MD  Fam-Trastuzumab Deruxtec-nxki (ENHERTU IV) Inject into the vein every 21 ( twenty-one) days. 04/10/22   [provider]  ferrous sulfate 325 (65 FE) MG tablet Take 1 tablet (325 mg total) by mouth daily with breakfast. 05/06/20   Cristal Deer, MD  fluticasone (FLONASE) 50 MCG/ACT nasal spray Place 2 sprays into both nostrils daily. 05/07/21   [name], MD  furosemide (LASIX) 20 MG tablet Take 1 tablet (20 mg total) by mouth daily as needed for fluid or edema. 03/04/22   [name], MD   gabapentin (NEURONTIN) 100 MG capsule Take 1 capsule (100 mg total) by mouth at bedtime. 03/17/22   [name], MD  isosorbide mononitrate (IMDUR) 30 MG 24 hr tablet TAKE 1 TABLET BY MOUTH EVERY DAY 03/11/22   [name], MD  KLOR-CON M10 10 MEQ tablet TAKE 1 TABLET BY MOUTH EVERY DAY 01/02/22   [name], MD  levothyroxine (SYNTHROID) 150 MCG tablet TAKE 1 TABLET BY MOUTH EVERY DAY BEFORE BREAKFAST 03/25/22   Derek Jack, MD  lidocaine-prilocaine (EMLA) cream Apply a quarter-sized amount to port a cath site and cover with plastic wrap 1 hour prior to infusion appointments 04/02/22   Derek Jack, MD  Misc. Devices MISC Please provide with left upper extremity lymphedema sleeve 11/25/21   Derek Jack, MD  nitroGLYCERIN (NITROSTAT) 0.4 MG SL tablet Place 1 tablet (0.4 mg total) under the tongue every 5 (five) minutes x 3 doses as needed. Patient not taking: Reported on 03/25/2022 01/11/14   Satira Sark, MD  Omega-3 Fatty Acids 300 MG CAPS Take 1 capsule (300 mg total) by mouth 2 (two) times daily. Patient taking differently: Take 1 capsule by mouth daily. 04/24/11   Serpe, Burna Forts, PA-C  prochlorperazine (COMPAZINE) 5 MG tablet Take 1 tablet (5 mg total) by mouth every 6 (six) hours as needed for nausea or vomiting. 04/02/22   Derek Jack, MD  rosuvastatin (CRESTOR) 40 MG tablet TAKE 1 TABLET BY MOUTH EVERY DAY 11/27/21   [name], MD  terbinafine (LAMISIL) 1 % cream Apply 1 Application topically 2 (two) times daily. 12/23/21   [name], MD  traMADol (ULTRAM) 50 MG tablet Take 1 tablet (50 mg total) by mouth every 8 (eight) hours as needed. 07/29/21   Derek Jack, MD      Allergies    Codeine    Review of Systems   Review of Systems  Constitutional:  Positive for appetite change and fatigue.  Respiratory:  Positive for cough.   Gastrointestinal:  Positive for abdominal pain and nausea.  Neurological:  Positive for weakness  (Generalized).  All other systems reviewed and are negative.   Physical Exam Updated Vital Signs BP (!) 149/75 (BP Location: Right Arm)   Pulse 71   Temp 98.2 F (36.8 C) (Oral)   Resp 17   Ht 5\' 6"  (1.676 m)   Wt 80.3 kg   SpO2 96%   BMI 28.57 kg/m  Physical Exam Vitals and nursing note reviewed.  Constitutional:      General: She is not in acute distress.    Appearance: She is well-developed. She is not ill-appearing, toxic-appearing or diaphoretic.  HENT:     Head: Normocephalic and atraumatic.     Mouth/Throat:     Mouth: Mucous membranes are moist.  Eyes:     General: No  scleral icterus.    Extraocular Movements: Extraocular movements intact.     Conjunctiva/sclera: Conjunctivae normal.  Cardiovascular:     Rate and Rhythm: Normal rate and regular rhythm.     Heart sounds: No murmur heard. Pulmonary:     Effort: Pulmonary effort is normal. No respiratory distress.     Breath sounds: Normal breath sounds. No wheezing or rales.  Chest:     Chest wall: No tenderness.  Abdominal:     Palpations: Abdomen is soft.     Tenderness: There is generalized abdominal tenderness. There is no guarding or rebound.  Musculoskeletal:        General: No swelling.     Cervical back: Neck supple.  Skin:    General: Skin is warm and dry.     Capillary Refill: Capillary refill takes less than 2 seconds.     Coloration: Skin is not cyanotic, jaundiced or pale.  Neurological:     General: No focal deficit present.     Mental Status: She is alert and oriented to person, place, and time.  Psychiatric:        Mood and Affect: Mood normal.        Behavior: Behavior normal.     ED Results / Procedures / Treatments   Labs (all labs ordered are listed, but only abnormal results are displayed) Labs Reviewed  CBC WITH DIFFERENTIAL/PLATELET  COMPREHENSIVE METABOLIC PANEL  LIPASE, BLOOD  URINALYSIS, ROUTINE W REFLEX MICROSCOPIC    EKG None  Radiology DG Chest 2 View  Result  Date: 04/08/2022 CLINICAL DATA:  Pt BIB daughter. C/o loss of appetite, productive cough with white sputum. States she has a constant sweet taste in her mouth. Endorses generalized abd pain since November. Pt A AND O x 4 in triage. NAD. EXAM: CHEST - 2 VIEW COMPARISON:  04/02/2022 FINDINGS: Stable pleural effusions left greater than right, with atelectasis/consolidation at the left base as before. Heart size upper limits normal. Aortic Atherosclerosis (ICD10-170.0). Stable right IJ port catheter. No pneumothorax. Multiple sclerotic bone lesions in humeri, left scapula, thoracolumbar spine as before. IMPRESSION: Stable pleural effusions, left greater than right, with left lower lobe atelectasis/consolidation. Electronically Signed   By: Lucrezia Europe M.D.   On: 04/08/2022 17:27    Procedures Procedures  {Document cardiac monitor, telemetry assessment procedure when appropriate:1}  Medications Ordered in ED Medications - No data to display  ED Course/ Medical Decision Making/ A&P   {   Click here for ABCD2, HEART and other calculatorsREFRESH Note before signing :1}                          Medical Decision Making Amount and/or Complexity of Data Reviewed Labs: ordered. Radiology: ordered.  Risk Prescription drug management.   This patient presents to the ED for concern of ***, this involves an extensive number of treatment options, and is a complaint that carries with it a high risk of complications and morbidity.  The differential diagnosis includes ***   Co morbidities that complicate the patient evaluation  ***   Additional history obtained:  Additional history obtained from *** External records from outside source obtained and reviewed including ***   Lab Tests:  I Ordered, and personally interpreted labs.  The pertinent results include:  ***   Imaging Studies ordered:  I ordered imaging studies including ***  I independently visualized and interpreted imaging which showed  *** I agree with the radiologist interpretation  Cardiac Monitoring: / EKG:  The patient was maintained on a cardiac monitor.  I personally viewed and interpreted the cardiac monitored which showed an underlying rhythm of: ***   Consultations Obtained:  I requested consultation with the ***,  and discussed lab and imaging findings as well as pertinent plan - they recommend: ***   Problem List / ED Course / Critical interventions / Medication management  Patient presents for ongoing generalized abdominal pain over the past several weeks.  She also endorses loss of appetite, very poor p.o. intake, and cough productive of white sputum.  On arrival in the ED, vital signs are normal.  SpO2 is normal on room air.  Her breathing is unlabored.  Her abdomen does that this time.  She does have generalized tenderness without guarding.  Given her poor p.o. intake, IV fluids were ordered.  Fentanyl was ordered for analgesia.  She denies any current nausea.  Will obtain CT imaging of chest, abdomen, pelvis.  Initial lab work shows baseline anemia and no leukocytosis.  Her creatinine seems mildly improved from baseline.  Electrolytes are normal.  Alkaline phosphatase is mildly elevated consistent with known bony metastatic disease.***. I ordered medication including ***  for ***  Reevaluation of the patient after these medicines showed that the patient {resolved/improved/worsened:23923::"improved"} I have reviewed the patients home medicines and have made adjustments as needed   Social Determinants of Health:  ***   Test / Admission - Considered:  ***   {Document critical care time when appropriate:1} {Document review of labs and clinical decision tools ie heart score, Chads2Vasc2 etc:1}  {Document your independent review of radiology images, and any outside records:1} {Document your discussion with family members, caretakers, and with consultants:1} {Document social determinants of health  affecting pt's care:1} {Document your decision making why or why not admission, treatments were needed:1} Final Clinical Impression(s) / ED Diagnoses Final diagnoses:  None    Rx / DC Orders ED Discharge Orders     None

## 2022-04-08 NOTE — ED Notes (Signed)
Pt's brief has been changed and a new one has been placed. Purewick back in place. Pt resting at this time.

## 2022-04-08 NOTE — Telephone Encounter (Signed)
Received call from daughter [name] to advise that patient is extremely weak, vomiting and has not been able to hold any food down.  In addition, continues to have a productive cough.  Advised she report to the ER.  Is supposed to start treatment on Thursday, however is too weak to begin treatment per Charlene.

## 2022-04-08 NOTE — ED Provider Triage Note (Signed)
Emergency Medicine Provider Triage Evaluation Note  Robin [name] , a 82 y.o. female past medical history of breast cancer with bony mets, type 2 diabetes, hypertension, prior stroke, coronary artery disease, and cardiomyopathy was evaluated in triage.  Pt complains of diffuse abdominal pain, loss of appetite, cough and generalized weakness.  Symptoms present for several weeks to months.  Describes cough is productive of thick white sputum.  Denies fever, chills, vomiting and diarrhea.  No urinary symptoms.  Is scheduled to start chemotherapy Thursday of this week.  Review of Systems  Positive: Generalized weakness, diffuse abdominal pain, loss of appetite, cough Negative: Fever, chills, chest pain, shortness of breath, vomiting or diarrhea  Physical Exam  BP (!) 149/75 (BP Location: Right Arm)   Pulse 71   Temp 98.2 F (36.8 C) (Oral)   Resp 17   Ht 5\' 6"  (1.676 m)   Wt 80.3 kg   SpO2 96%   BMI 28.57 kg/m  Gen:   Awake, no distress   Resp:  Normal effort  MSK:   Moves extremities without difficulty  Other:  Diffuse tenderness palpation entire abdomen  Medical Decision Making  Medically screening exam initiated at 5:14 PM.  Appropriate orders placed.  LUE DUNCKEL was informed that the remainder of the evaluation will be completed by another provider, this initial triage assessment does not replace that evaluation, and the importance of remaining in the ED until their evaluation is complete.     Kem Parkinson, PA-C 04/08/22 1717

## 2022-04-08 NOTE — ED Triage Notes (Signed)
Pt BIB daughter. C/o loss of appetite, productive cough with white sputum. States she has a constant sweet taste in her mouth.  Endorses generalized abd pain since November.   Pt A & O x 4 in triage. NAD.   Set to start chemo on Thursday for bone cancer.

## 2022-04-09 ENCOUNTER — Telehealth: Payer: Self-pay | Admitting: *Deleted

## 2022-04-09 MED ORDER — OXYCODONE-ACETAMINOPHEN 5-325 MG PO TABS: 1.0000 | ORAL_TABLET | Freq: Three times a day (TID) | ORAL | 0 refills | Status: DC | PRN

## 2022-04-09 MED ORDER — AZITHROMYCIN 250 MG PO TABS: 250.0000 mg | ORAL_TABLET | Freq: Every day | ORAL | 0 refills | Status: AC

## 2022-04-09 MED ORDER — FLUCONAZOLE 150 MG PO TABS
150.0000 mg | ORAL_TABLET | Freq: Once | ORAL | Status: AC
  Administered 2022-04-09: 150 mg via ORAL
  Filled 2022-04-09: qty 1

## 2022-04-09 MED ORDER — CEFPODOXIME PROXETIL 200 MG PO TABS: 200.0000 mg | ORAL_TABLET | Freq: Two times a day (BID) | ORAL | 0 refills | Status: AC

## 2022-04-09 MED ORDER — FLUCONAZOLE 150 MG PO TABS: 150.0000 mg | ORAL_TABLET | Freq: Every day | ORAL | 0 refills | Status: AC

## 2022-04-09 NOTE — Telephone Encounter (Signed)
Received call from daughter [name] expressing that patient has decided not to peruse treatment, in light of findings on recent CT in ER and the fact that she is already unable to eat and is terribly weak.  Referral made to [facility] for Palliative referral.  Dr. Delton Coombes aware.

## 2022-04-09 NOTE — Telephone Encounter (Signed)
Daughter [name] called to cancel appointments for tomorrow, as she has been diagnosed with URI and is on 2 antibiotics.  Would like to wait 2 weeks to start treatment.  Dr. Delton Coombes aware.

## 2022-04-09 NOTE — Discharge Instructions (Addendum)
Your CT scan showed the following:  1. No arterial embolus is seen through the segmental level. The subsegmental arterial bed is largely not evaluated due to breathing motion and left-greater-than-right pleural effusions. 2. Increasing left-greater-than-right pleural effusions, now obscuring most of the left lower lobe and lingular base. 3. Increasing size of focal consolidation or infiltrative mass in the posterior segment of the right upper lobe, now measuring 4.7 x 2.7 cm, previously 3.1 x 1.7 cm. 4. New 5 mm right apical lung nodule. 5. Micronodular disease and interlobular septal thickening in the mid to lower lung fields primarily anteriorly, could be due to lymphangitic carcinomatosis and/or edema. 6. Stable 8 mm pleural-based nodule in the posterior right apex, stable apical interlobular septal thickening. 7. Cardiomegaly with stable small pericardial effusion. 8. Aortic and coronary artery atherosclerosis. 9. Suspicion of a 3.5 x 2.8 cm heterogeneous mass in the dome of segment 8 of the right lobe of the liver worrisome for a metastasis. 10. 1.8 cm low-density lesion in the lower pole of the left kidney, above the usual density of fluid but not significantly changed in size. This could be a complex cyst or solid lesion. 11. Interval increased prominence of a complex multilocular mass in the lower medial right buttock under 40 Hounsfield units. Favor multilocular viscous abscess over neoplasm. 12. Diverticulosis without diverticulitis. 13. Extensive diffuse bone metastases. No appreciable change in the overall appearance.  These findings are consistent with worsening spread of cancer.  You received antibiotics for a possible right upper lobe pneumonia while in the emergency department.  Prescriptions for continued antibiotics were sent to your pharmacy.  You also received a dose of Diflucan to prevent yeast infection in the ER.  A second dose was prescribed for you to take on  Saturday.  A prescription for narcotic pain medication was sent to your pharmacy as well.  Take only as needed.  Discuss ongoing pain management with Dr. Raliegh Ip.  Call Dr. Raliegh Ip in the morning and set up a follow-up appointment to discuss CT scan findings and plans for future management.    Return to the emergency department for any new or worsening symptoms of concern.

## 2022-04-10 ENCOUNTER — Inpatient Hospital Stay: Payer: Medicare PPO | Admitting: Dietician

## 2022-04-10 ENCOUNTER — Inpatient Hospital Stay: Payer: Medicare PPO

## 2022-04-10 ENCOUNTER — Inpatient Hospital Stay: Payer: Medicare PPO | Admitting: Licensed Clinical Social Worker

## 2022-04-14 ENCOUNTER — Other Ambulatory Visit: Payer: Self-pay

## 2022-04-14 ENCOUNTER — Other Ambulatory Visit: Payer: Self-pay | Admitting: *Deleted

## 2022-04-14 ENCOUNTER — Telehealth: Payer: Self-pay | Admitting: *Deleted

## 2022-04-14 DIAGNOSIS — C7951 Secondary malignant neoplasm of bone: Secondary | ICD-10-CM

## 2022-04-14 MED ORDER — ESCITALOPRAM OXALATE 10 MG PO TABS: 10.0000 mg | ORAL_TABLET | Freq: Every day | ORAL | 3 refills | Status: DC

## 2022-04-14 NOTE — Telephone Encounter (Signed)
In addition to previous note, Charlene wanted to know when she was going to start treatment back, I advised that her sister [name] had called last week stating that [facility] no longer wanted to pursue treatment and requested a referral to Palliative care of [facility].  Patient was in the background and was agreeable when I spoke to her.  [name] was not aware of this and was asked to have a conversation with [place] and her mother.  I made her aware that this could be discussed at follow up visit and we could proceed from there if [facility], in fact wished to continue.  Dr. Delton Coombes made aware.

## 2022-04-14 NOTE — Telephone Encounter (Signed)
Daughter [name] called to advise that patient is experiencing depression.  Referral placed for Social work and will send in 10 mg Lexapro daily per Dr. Delton Coombes.

## 2022-04-15 ENCOUNTER — Telehealth: Payer: Self-pay

## 2022-04-15 NOTE — Telephone Encounter (Signed)
     Patient  visit on 3/20  at [facility] you been able to follow up with your primary care physician? Yes   The patient was or was not able to obtain any needed medicine or equipment. Yes   Are there diet recommendations that you are having difficulty following? Na   Patient expresses understanding of discharge instructions and education provided has no other needs at this time.  Yes     [facility] [phone] [address], [place], [place] [address] Phone: [phone] Email: [email or web address]

## 2022-04-17 ENCOUNTER — Ambulatory Visit: Payer: Medicare PPO | Admitting: Physician Assistant

## 2022-04-17 ENCOUNTER — Ambulatory Visit: Payer: Medicare PPO

## 2022-04-17 ENCOUNTER — Other Ambulatory Visit: Payer: Medicare PPO

## 2022-04-19 ENCOUNTER — Emergency Department (HOSPITAL_COMMUNITY): Payer: Medicare PPO

## 2022-04-19 ENCOUNTER — Emergency Department (HOSPITAL_COMMUNITY)
Admission: EM | Admit: 2022-04-19 | Discharge: 2022-04-19 | Disposition: A | Discharge status: Home / Self Care | Payer: Medicare PPO | Attending: Emergency Medicine | Admitting: Emergency Medicine

## 2022-04-19 ENCOUNTER — Encounter (HOSPITAL_COMMUNITY): Payer: Self-pay | Admitting: *Deleted

## 2022-04-19 ENCOUNTER — Other Ambulatory Visit: Payer: Self-pay

## 2022-04-19 DIAGNOSIS — E1122 Type 2 diabetes mellitus with diabetic chronic kidney disease: Secondary | ICD-10-CM | POA: Diagnosis not present

## 2022-04-19 DIAGNOSIS — Z96651 Presence of right artificial knee joint: Secondary | ICD-10-CM | POA: Diagnosis not present

## 2022-04-19 DIAGNOSIS — Z79899 Other long term (current) drug therapy: Secondary | ICD-10-CM | POA: Insufficient documentation

## 2022-04-19 DIAGNOSIS — Z853 Personal history of malignant neoplasm of breast: Secondary | ICD-10-CM | POA: Insufficient documentation

## 2022-04-19 DIAGNOSIS — K5900 Constipation, unspecified: Secondary | ICD-10-CM

## 2022-04-19 DIAGNOSIS — E039 Hypothyroidism, unspecified: Secondary | ICD-10-CM | POA: Diagnosis not present

## 2022-04-19 DIAGNOSIS — K5730 Diverticulosis of large intestine without perforation or abscess without bleeding: Secondary | ICD-10-CM | POA: Insufficient documentation

## 2022-04-19 DIAGNOSIS — I129 Hypertensive chronic kidney disease with stage 1 through stage 4 chronic kidney disease, or unspecified chronic kidney disease: Secondary | ICD-10-CM | POA: Diagnosis not present

## 2022-04-19 DIAGNOSIS — K5790 Diverticulosis of intestine, part unspecified, without perforation or abscess without bleeding: Secondary | ICD-10-CM

## 2022-04-19 DIAGNOSIS — N1831 Chronic kidney disease, stage 3a: Secondary | ICD-10-CM | POA: Insufficient documentation

## 2022-04-19 DIAGNOSIS — C799 Secondary malignant neoplasm of unspecified site: Secondary | ICD-10-CM

## 2022-04-19 DIAGNOSIS — R109 Unspecified abdominal pain: Secondary | ICD-10-CM

## 2022-04-19 DIAGNOSIS — N281 Cyst of kidney, acquired: Secondary | ICD-10-CM | POA: Diagnosis not present

## 2022-04-19 DIAGNOSIS — Z7901 Long term (current) use of anticoagulants: Secondary | ICD-10-CM | POA: Diagnosis not present

## 2022-04-19 DIAGNOSIS — I2510 Atherosclerotic heart disease of native coronary artery without angina pectoris: Secondary | ICD-10-CM | POA: Insufficient documentation

## 2022-04-19 LAB — COMPREHENSIVE METABOLIC PANEL
ALT: 32 U/L (ref 0–44)
AST: 136 U/L — ABNORMAL HIGH (ref 15–41)
Albumin: 3.2 g/dL — ABNORMAL LOW (ref 3.5–5.0)
Alkaline Phosphatase: 237 U/L — ABNORMAL HIGH (ref 38–126)
Anion gap: 13 (ref 5–15)
BUN: 21 mg/dL (ref 8–23)
CO2: 23 mmol/L (ref 22–32)
Calcium: 9.4 mg/dL (ref 8.9–10.3)
Chloride: 100 mmol/L (ref 98–111)
Creatinine, Ser: 1.03 mg/dL — ABNORMAL HIGH (ref 0.44–1.00)
GFR, Estimated: 54 mL/min — ABNORMAL LOW (ref >60)
Glucose, Bld: 126 mg/dL — ABNORMAL HIGH (ref 70–99)
Potassium: 3.5 mmol/L (ref 3.5–5.1)
Sodium: 136 mmol/L (ref 135–145)
Total Bilirubin: 1.3 mg/dL — ABNORMAL HIGH (ref 0.3–1.2)
Total Protein: 6.6 g/dL (ref 6.5–8.1)

## 2022-04-19 LAB — CBC WITH DIFFERENTIAL/PLATELET
Abs Immature Granulocytes: 0.14 10*3/uL — ABNORMAL HIGH (ref 0.00–0.07)
Basophils Absolute: 0 10*3/uL (ref 0.0–0.1)
Basophils Relative: 1 %
Eosinophils Absolute: 0.1 10*3/uL (ref 0.0–0.5)
Eosinophils Relative: 1 %
HCT: 34.4 % — ABNORMAL LOW (ref 36.0–46.0)
Hemoglobin: 11.1 g/dL — ABNORMAL LOW (ref 12.0–15.0)
Immature Granulocytes: 2 %
Lymphocytes Relative: 10 %
Lymphs Abs: 0.6 10*3/uL — ABNORMAL LOW (ref 0.7–4.0)
MCH: 30 pg (ref 26.0–34.0)
MCHC: 32.3 g/dL (ref 30.0–36.0)
MCV: 93 fL (ref 80.0–100.0)
Monocytes Absolute: 0.7 10*3/uL (ref 0.1–1.0)
Monocytes Relative: 12 %
Neutro Abs: 4.9 10*3/uL (ref 1.7–7.7)
Neutrophils Relative %: 74 %
Platelets: 178 10*3/uL (ref 150–400)
RBC: 3.7 MIL/uL — ABNORMAL LOW (ref 3.87–5.11)
RDW: 18.4 % — ABNORMAL HIGH (ref 11.5–15.5)
WBC: 6.5 10*3/uL (ref 4.0–10.5)
nRBC: 1.6 % — ABNORMAL HIGH (ref 0.0–0.2)

## 2022-04-19 LAB — LIPASE, BLOOD: Lipase: 28 U/L (ref 11–51)

## 2022-04-19 LAB — LACTIC ACID, PLASMA: Lactic Acid, Venous: 1.8 mmol/L (ref 0.5–1.9)

## 2022-04-19 MED ORDER — HEPARIN SOD (PORK) LOCK FLUSH 100 UNIT/ML IV SOLN
INTRAVENOUS | Status: AC
  Administered 2022-04-19: 100 [IU]
  Filled 2022-04-19: qty 5

## 2022-04-19 MED ORDER — SODIUM CHLORIDE 0.9 % IV BOLUS
500.0000 mL | Freq: Once | INTRAVENOUS | Status: AC
  Administered 2022-04-19: 500 mL via INTRAVENOUS

## 2022-04-19 MED ORDER — PANTOPRAZOLE SODIUM 20 MG PO TBEC: 20.0000 mg | DELAYED_RELEASE_TABLET | Freq: Every day | ORAL | 0 refills | Status: AC

## 2022-04-19 MED ORDER — ONDANSETRON HCL 4 MG/2ML IJ SOLN
4.0000 mg | Freq: Once | INTRAMUSCULAR | Status: AC
  Administered 2022-04-19: 4 mg via INTRAVENOUS
  Filled 2022-04-19: qty 2

## 2022-04-19 MED ORDER — SUCRALFATE 1 G PO TABS: 1.0000 g | ORAL_TABLET | Freq: Three times a day (TID) | ORAL | 0 refills | Status: AC

## 2022-04-19 MED ORDER — IOHEXOL 300 MG/ML  SOLN
100.0000 mL | Freq: Once | INTRAMUSCULAR | Status: AC | PRN
  Administered 2022-04-19: 100 mL via INTRAVENOUS

## 2022-04-19 MED ORDER — FAMOTIDINE IN NACL 20-0.9 MG/50ML-% IV SOLN
20.0000 mg | Freq: Once | INTRAVENOUS | Status: AC
  Administered 2022-04-19: 20 mg via INTRAVENOUS
  Filled 2022-04-19: qty 50

## 2022-04-19 NOTE — ED Provider Notes (Signed)
[facility] Provider Note  CSN: [id number] Arrival date & time: 04/19/22 1806  Chief Complaint(s) Abdominal Pain  HPI Robin [name] is a 82 y.o. female with past medical history as below, significant for breast cancer, CAD, cardiomyopathy, HTN, HLD, CVA with left-sided residual weakness, DM who presents to the ED with complaint of abd pain, constipation, nausea.  Patient with epigastric and bilateral lower quad abdominal pain over the past week, gradually worsening.  Nausea, reduced p.o. intake.  P.o. sometimes makes her pain worse.  Pain described as burning, aching sensation.  Last bowel movement was earlier today but was a small pellet sized stool.  Has not had typical bowel movement over a week.  Bowel mvmt today was brown.  No melena or BRBPR.  No hematemesis.  No excessive alcohol or NSAID use.  Prior cholecystectomy  Past Medical History Past Medical History:  Diagnosis Date   Breast cancer ([place])    Tamoxifen started 2007   CAD (coronary artery disease)    DES second diagonal 2/12   Cardiomyopathy    Probable Takotsubo, LVEF 30-35% 2/12   Carotid artery disease ([place])    Essential hypertension    Hepatic steatosis    Hyperlipidemia    Hypothyroidism    NSTEMI (non-ST elevated myocardial infarction) ([place])    2/12   Pancreas divisum    Stroke ([place])    (2000) residual left-sided weakness   Type 2 diabetes mellitus ([place])    reports not currently diabetic   Patient Active Problem List   Diagnosis Date Noted   Abdominal cramping 03/06/2022   Genetic testing 03/05/2022   Intertrigo 12/17/2021   Generalized weakness 11/29/2021   Hypocalcemia 11/29/2021   History of DVT (deep vein thrombosis) 11/29/2021   Pancytopenia ([place]) 11/29/2021   Hypokalemia 11/29/2021   Stage 3a chronic kidney disease ([place]) 10/24/2021   Trochanteric bursitis of right hip 09/06/2021   Encounter for general adult medical examination with abnormal findings  06/24/2021   Gastroesophageal reflux disease 06/24/2021   H/O hyperlipidemia 05/07/2021   Spondylosis of cervical spine with radiculopathy 01/30/2021   Leg swelling 07/19/2020   Recurrent malignant neoplasm of left breast ([place]) 05/17/2020   Malignant neoplasm metastatic to bone ([place]) 05/17/2020   Chronic anemia    Malignant neoplasm of left breast in female, estrogen receptor positive ([place])    Acute DVT (deep venous thrombosis) --LtUE---03/2020 04/04/2020   Urinary incontinence 11/24/2019   Vitamin D deficiency 10/22/2017   Constipation 11/17/2016   Prediabetes 07/19/2012   Tinnitus 12/04/2011   Hypothyroidism 09/19/2011   OA (osteoarthritis) of hip 09/19/2011   Obesity 09/19/2011   Carotid artery disease ([place]) 04/24/2011   Peripheral edema 09/17/2010   Secondary cardiomyopathy ([place]) 04/09/2010   Coronary atherosclerosis of native coronary artery 04/09/2010   Essential hypertension, benign 04/09/2010   Mixed hyperlipidemia 04/09/2010   Home Medication(s) Prior to Admission medications   Medication Sig Start Date End Date Taking? Authorizing Provider  acetaminophen (TYLENOL) 325 MG tablet Take 2 tablets (650 mg total) by mouth every 6 (six) hours as needed for mild pain, fever or headache (or Fever >/= 101). Patient taking differently: Take 325 mg by mouth every 6 (six) hours as needed for mild pain, fever or headache (or Fever >/= 101). 04/06/20  Yes Emokpae, Courage, MD  amLODipine (NORVASC) 10 MG tablet TAKE 1 TABLET BY MOUTH EVERY DAY 03/11/22  Yes [name], MD  anastrozole (ARIMIDEX) 1 MG tablet TAKE 1 TABLET BY  MOUTH EVERY DAY Patient taking differently: Take 1 mg by mouth daily. 12/23/21  Yes Derek Jack, MD  carvedilol (COREG) 12.5 MG tablet TAKE 1 TABLET (12.5MG  TOTAL) BY MOUTH TWICE A DAY WITH MEALS Patient taking differently: Take 12.5 mg by mouth 2 (two) times daily with a meal. 11/18/21  Yes [name], MD  Cholecalciferol (VITAMIN D3) 125 MCG (5000 UT)  CAPS TAKE 1 CAPSULE BY MOUTH ONCE DAILY Patient taking differently: Take 5,000 Units by mouth daily. 01/14/18  Yes Nida, Marella Chimes, MD  dicyclomine (BENTYL) 10 MG capsule Take 1 capsule (10 mg total) by mouth 3 (three) times daily as needed for spasms. 03/04/22  Yes [name], MD  dronabinol (MARINOL) 5 MG capsule Take 1 capsule (5 mg total) by mouth 2 (two) times daily before a meal. Patient taking differently: Take 5 mg by mouth daily as needed (for appetite). 03/25/22  Yes Derek Jack, MD  ELIQUIS 5 MG TABS tablet TAKE 1 TABLET BY MOUTH TWICE A DAY 03/11/22  Yes Derek Jack, MD  escitalopram (LEXAPRO) 10 MG tablet Take 1 tablet (10 mg total) by mouth daily. 04/14/22  Yes Derek Jack, MD  ferrous sulfate 325 (65 FE) MG tablet Take 1 tablet (325 mg total) by mouth daily with breakfast. 05/06/20  Yes Cristal Deer, MD  fluticasone (FLONASE) 50 MCG/ACT nasal spray Place 2 sprays into both nostrils daily. Patient taking differently: Place 2 sprays into both nostrils daily as needed for allergies. 05/07/21  Yes [name], MD  furosemide (LASIX) 20 MG tablet Take 1 tablet (20 mg total) by mouth daily as needed for fluid or edema. 03/04/22  Yes [name], MD  gabapentin (NEURONTIN) 100 MG capsule Take 1 capsule (100 mg total) by mouth at bedtime. Patient taking differently: Take 100 mg by mouth at bedtime as needed (for pain). 03/17/22  Yes [name], MD  isosorbide mononitrate (IMDUR) 30 MG 24 hr tablet TAKE 1 TABLET BY MOUTH EVERY DAY 03/11/22  Yes Patel, Colin Broach, MD  KLOR-CON M10 10 MEQ tablet TAKE 1 TABLET BY MOUTH EVERY DAY Patient taking differently: 10 mEq daily. 01/02/22  Yes [name], MD  lidocaine-prilocaine (EMLA) cream Apply a quarter-sized amount to port a cath site and cover with plastic wrap 1 hour prior to infusion appointments 04/02/22  Yes Derek Jack, MD  nitroGLYCERIN (NITROSTAT) 0.4 MG SL tablet Place 1 tablet (0.4 mg  total) under the tongue every 5 (five) minutes x 3 doses as needed. 01/11/14  Yes Satira Sark, MD  Omega-3 Fatty Acids 300 MG CAPS Take 1 capsule (300 mg total) by mouth 2 (two) times daily. Patient taking differently: Take 1 capsule by mouth daily. 04/24/11  Yes Serpe, Burna Forts, PA-C  prochlorperazine (COMPAZINE) 5 MG tablet Take 1 tablet (5 mg total) by mouth every 6 (six) hours as needed for nausea or vomiting. 04/02/22  Yes Derek Jack, MD  SYNTHROID 125 MCG tablet Take 150 mcg by mouth daily. 04/08/22  Yes [provider]  traMADol (ULTRAM) 50 MG tablet Take 1 tablet (50 mg total) by mouth every 8 (eight) hours as needed. 07/29/21  Yes Derek Jack, MD  Fam-Trastuzumab Deruxtec-nxki (ENHERTU IV) Inject into the vein every 21 ( twenty-one) days. 04/10/22   [provider]  Misc. Devices MISC Please provide with left upper extremity lymphedema sleeve 11/25/21   Derek Jack, MD  Past Surgical History Past Surgical History:  Procedure Laterality Date   ABDOMINAL HYSTERECTOMY     CAROTID ENDARTERECTOMY     Left   CHOLECYSTECTOMY     COLONOSCOPY WITH PROPOFOL N/A 05/03/2020   Procedure: COLONOSCOPY WITH PROPOFOL;  Surgeon: Daneil Dolin, MD;  [facility];  Service: Endoscopy;  Laterality: N/A;   ESOPHAGOGASTRODUODENOSCOPY (EGD) WITH PROPOFOL N/A 05/02/2020   Procedure: ESOPHAGOGASTRODUODENOSCOPY (EGD) WITH PROPOFOL;  Surgeon: Rogene Houston, MD;  Location: [facility];  Service: Endoscopy;  Laterality: N/A;   GIVENS CAPSULE STUDY N/A 05/04/2020   Procedure: GIVENS CAPSULE STUDY;  Surgeon: Harvel Quale, MD;  Location: [facility];  Service: Gastroenterology;  Laterality: N/A;   IR IMAGING GUIDED PORT INSERTION  03/26/2022   JOINT REPLACEMENT     Right knee replacement- [facility]. [facility]    MASTECTOMY     Bilateral   POLYPECTOMY  05/03/2020   Procedure: POLYPECTOMY;  Surgeon: Daneil Dolin, MD;  Location: [facility];  Service: Endoscopy;;   Family History Family History  Problem Relation Age of Onset   Heart attack Sister    Heart disease Sister    Heart attack Brother    Heart disease Brother    Throat cancer Brother    Colon cancer Neg Hx     Social History Social History   Tobacco Use   Smoking status: Never   Smokeless tobacco: Never  Vaping Use   Vaping Use: Never used  Substance Use Topics   Alcohol use: No    Alcohol/week: 0.0 standard drinks of alcohol   Drug use: No   Allergies Codeine  Review of Systems Review of Systems  Constitutional:  Negative for activity change and fever.  HENT:  Negative for facial swelling and trouble swallowing.   Eyes:  Negative for discharge and redness.  Respiratory:  Negative for cough and shortness of breath.   Cardiovascular:  Negative for chest pain and palpitations.  Gastrointestinal:  Positive for abdominal pain and nausea.  Genitourinary:  Negative for dysuria and flank pain.  Musculoskeletal:  Negative for back pain and gait problem.  Skin:  Negative for pallor and rash.  Neurological:  Negative for syncope and headaches.    Physical Exam Vital Signs  I have reviewed the triage vital signs BP 127/66   Pulse 74   Temp 98.6 F (37 C) (Oral)   Resp 14   Ht 5\' 6"  (1.676 m)   SpO2 96%   BMI 28.57 kg/m  Physical Exam Vitals and nursing note reviewed.  Constitutional:      General: She is not in acute distress.    Appearance: Normal appearance.  HENT:     Head: Normocephalic and atraumatic.     Right Ear: External ear normal.     Left Ear: External ear normal.     Nose: Nose normal.     Mouth/Throat:     Mouth: Mucous membranes are moist.  Eyes:     General: No scleral icterus.       Right eye: No discharge.        Left eye: No discharge.  Cardiovascular:     Rate and Rhythm: Normal  rate and regular rhythm.     Pulses: Normal pulses.     Heart sounds: Normal heart sounds.  Pulmonary:     Effort: Pulmonary effort is normal. No respiratory distress.     Breath sounds: Normal breath sounds.  Chest:    Abdominal:  General: Abdomen is flat.     Palpations: Abdomen is soft.     Tenderness: There is abdominal tenderness. There is no guarding or rebound.    Musculoskeletal:     Right lower leg: No edema.     Left lower leg: No edema.  Skin:    General: Skin is warm and dry.     Capillary Refill: Capillary refill takes less than 2 seconds.  Neurological:     Mental Status: She is alert.  Psychiatric:        Mood and Affect: Mood normal.        Behavior: Behavior normal.     ED Results and Treatments Labs (all labs ordered are listed, but only abnormal results are displayed) Labs Reviewed  CBC WITH DIFFERENTIAL/PLATELET - Abnormal; Notable for the following components:      Result Value   RBC 3.70 (*)    Hemoglobin 11.1 (*)    HCT 34.4 (*)    RDW 18.4 (*)    nRBC 1.6 (*)    Lymphs Abs 0.6 (*)    Abs Immature Granulocytes 0.14 (*)    All other components within normal limits  COMPREHENSIVE METABOLIC PANEL - Abnormal; Notable for the following components:   Glucose, Bld 126 (*)    Creatinine, Ser 1.03 (*)    Albumin 3.2 (*)    AST 136 (*)    Alkaline Phosphatase 237 (*)    Total Bilirubin 1.3 (*)    GFR, Estimated 54 (*)    All other components within normal limits  LIPASE, BLOOD  URINALYSIS, ROUTINE W REFLEX MICROSCOPIC                                                                                                                          Radiology No results found.  Pertinent labs & imaging results that were available during my care of the patient were reviewed by me and considered in my medical decision making (see MDM for details).  Medications Ordered in ED Medications  famotidine (PEPCID) IVPB 20 mg premix (20 mg Intravenous New  Bag/Given 04/19/22 1944)  ondansetron (ZOFRAN) injection 4 mg (4 mg Intravenous Given 04/19/22 1943)  sodium chloride 0.9 % bolus 500 mL (500 mLs Intravenous New Bag/Given 04/19/22 1943)  Procedures Procedures  (including critical care time)  Medical Decision Making / ED Course    Medical Decision Making:    ADELENA DEVEGA is a 82 y.o. female with past medical history as below, significant for breast cancer, CAD, cardiomyopathy, HTN, HLD, CVA with left-sided residual weakness, DM who presents to the ED with complaint of abd pain, constipation, nausea.. The complaint involves an extensive differential diagnosis and also carries with it a high risk of complications and morbidity.  Serious etiology was considered. Ddx includes but is not limited to: Differential diagnosis includes but is not exclusive to acute cholecystitis, intrathoracic causes for epigastric abdominal pain, gastritis, duodenitis, pancreatitis, small bowel or large bowel obstruction, abdominal aortic aneurysm, hernia, gastritis, etc. Differential diagnosis includes but is not exclusive to ectopic pregnancy, ovarian cyst, ovarian torsion, acute appendicitis, urinary tract infection, endometriosis, bowel obstruction, hernia, colitis, renal colic, gastroenteritis, volvulus etc.   Complete initial physical exam performed, notably the patient  was NAD, abd not peritoneal.    Reviewed and confirmed nursing documentation for past medical history, family history, social history.  Vital signs reviewed.        Additional history obtained: -Additional history obtained from family -External records from outside source obtained and reviewed including: Chart review including previous notes, labs, imaging, consultation notes including prior ED visits, prior labs and imaging, medications, prior oncology note,  Dr. Marcha Dutton Patient was evaluated in ED 3/19, diagnosed with pneumonia, started on antibiotics for pneumonia.  CT with worsening metastatic disease.  Lab Tests: -I ordered, reviewed, and interpreted labs.   The pertinent results include:   Labs Reviewed  CBC WITH DIFFERENTIAL/PLATELET - Abnormal; Notable for the following components:      Result Value   RBC 3.70 (*)    Hemoglobin 11.1 (*)    HCT 34.4 (*)    RDW 18.4 (*)    nRBC 1.6 (*)    Lymphs Abs 0.6 (*)    Abs Immature Granulocytes 0.14 (*)    All other components within normal limits  COMPREHENSIVE METABOLIC PANEL - Abnormal; Notable for the following components:   Glucose, Bld 126 (*)    Creatinine, Ser 1.03 (*)    Albumin 3.2 (*)    AST 136 (*)    Alkaline Phosphatase 237 (*)    Total Bilirubin 1.3 (*)    GFR, Estimated 54 (*)    All other components within normal limits  LIPASE, BLOOD  URINALYSIS, ROUTINE W REFLEX MICROSCOPIC    Notable for ***  EKG   EKG Interpretation  Date/Time:    Ventricular Rate:    PR Interval:    QRS Duration:   QT Interval:    QTC Calculation:   R Axis:     Text Interpretation:           Imaging Studies ordered: I ordered imaging studies including *** I independently visualized the following imaging with scope of interpretation limited to determining acute life threatening conditions related to emergency care; findings noted above, significant for *** I independently visualized and interpreted imaging. I agree with the radiologist interpretation   Medicines ordered and prescription drug management: Meds ordered this encounter  Medications   ondansetron (ZOFRAN) injection 4 mg   famotidine (PEPCID) IVPB 20 mg premix   sodium chloride 0.9 % bolus 500 mL    -I have reviewed the patients home medicines and have made adjustments as needed   Consultations Obtained: I requested consultation with the ***,  and discussed lab and imaging findings  as well as pertinent plan - they  recommend: ***   Cardiac Monitoring: The patient was maintained on a cardiac monitor.  I personally viewed and interpreted the cardiac monitored which showed an underlying rhythm of: ***  Social Determinants of Health:  Diagnosis or treatment significantly limited by social determinants of health: {wssoc:28071}   Reevaluation: After the interventions noted above, I reevaluated the patient and found that they have {resolved/improved/worsened:23923::"improved"}  Co morbidities that complicate the patient evaluation  Past Medical History:  Diagnosis Date   Breast cancer ([place])    Tamoxifen started 2007   CAD (coronary artery disease)    DES second diagonal 2/12   Cardiomyopathy    Probable Takotsubo, LVEF 30-35% 2/12   Carotid artery disease ([place])    Essential hypertension    Hepatic steatosis    Hyperlipidemia    Hypothyroidism    NSTEMI (non-ST elevated myocardial infarction) ([place])    2/12   Pancreas divisum    Stroke ([place])    (2000) residual left-sided weakness   Type 2 diabetes mellitus ([place])    reports not currently diabetic      Dispostion: Disposition decision including need for hospitalization was considered, and patient {wsdispo:28070::"discharged from emergency department."}    Final Clinical Impression(s) / ED Diagnoses Final diagnoses:  None     This chart was dictated using voice recognition software.  Despite best efforts to proofread,  errors can occur which can change the documentation meaning.

## 2022-04-19 NOTE — ED Notes (Signed)
Patient was assisted to the bathroom. Patient did have a bowel movement. Unable to obtain urine sample.

## 2022-04-19 NOTE — Discharge Instructions (Addendum)
Please follow up with your primary care doctor within 2-3 days. For constipation we also recommend a diet high in fiber (beans, fruits, vegetables, whole grains). Take Colace 100-200 mg up to three times per day. You may take along with Senokot 1-2 tabs, ingest with full glass of water.  You may also take MiraLAX 1-2 capfuls 1-2 times a day until stools become soft and then slowly decrease the amount of MiraLAX used.  Maintain fluid intake. Please increase fibers in your diet. You may also take Milk of Magnesia 30 mL as needed for constipation, you may repeat in 2 hours again if no bowl movement.  It was a pleasure caring for you today in the emergency department.  Please return to the emergency department for any worsening or worrisome symptoms.

## 2022-04-19 NOTE — ED Triage Notes (Signed)
Pt with abd pain and spitting up blood.  Pt seen on 3/19.  Not able to eat or keep anything down at times. LBM last Sunday.  Pt with stage 4 breast cancer and bone cancer, to start chemo 04-24-22.

## 2022-04-19 NOTE — ED Notes (Signed)
Patient transported to CT 

## 2022-04-21 ENCOUNTER — Encounter (HOSPITAL_COMMUNITY): Payer: Self-pay | Admitting: Emergency Medicine

## 2022-04-22 ENCOUNTER — Other Ambulatory Visit: Payer: Self-pay | Admitting: *Deleted

## 2022-04-22 ENCOUNTER — Ambulatory Visit: Payer: Medicare PPO | Admitting: Hematology

## 2022-04-22 ENCOUNTER — Other Ambulatory Visit: Payer: Self-pay | Admitting: Hematology

## 2022-04-22 ENCOUNTER — Telehealth: Payer: Self-pay

## 2022-04-22 ENCOUNTER — Ambulatory Visit: Payer: Medicare PPO

## 2022-04-22 ENCOUNTER — Other Ambulatory Visit: Payer: Medicare PPO

## 2022-04-22 ENCOUNTER — Telehealth: Payer: Self-pay | Admitting: *Deleted

## 2022-04-22 NOTE — Telephone Encounter (Signed)
        Patient  visited [facility] on 04/19/2022  for Abdominal Pain.   Telephone encounter attempt :  1st  A HIPAA compliant voice message was left requesting a return call.  Instructed patient to call back at Unable to leave message voicemail not setup on mobile, no answer at home number.   [facility] Resource Care Guide   ??[email or web address]  ?? [phone]   Website: [email or web address]  [email or web address]

## 2022-04-22 NOTE — Telephone Encounter (Signed)
Anastrozole refill approved.  Patient is tolerating and is to continue therapy.  

## 2022-04-22 NOTE — Telephone Encounter (Signed)
Received call from [facility] stating that patient has withdrawn from Palliative care due to family conflict.  Dr. Delton Coombes aware.

## 2022-04-24 ENCOUNTER — Inpatient Hospital Stay: Payer: Medicare PPO

## 2022-04-24 ENCOUNTER — Inpatient Hospital Stay: Payer: Medicare PPO | Attending: Hematology | Admitting: Hematology

## 2022-04-24 ENCOUNTER — Telehealth: Payer: Self-pay

## 2022-04-24 ENCOUNTER — Inpatient Hospital Stay: Payer: Medicare PPO | Admitting: Licensed Clinical Social Worker

## 2022-04-24 ENCOUNTER — Telehealth: Payer: Self-pay | Admitting: *Deleted

## 2022-04-24 DIAGNOSIS — C7951 Secondary malignant neoplasm of bone: Secondary | ICD-10-CM | POA: Diagnosis not present

## 2022-04-24 DIAGNOSIS — C787 Secondary malignant neoplasm of liver and intrahepatic bile duct: Secondary | ICD-10-CM | POA: Diagnosis not present

## 2022-04-24 DIAGNOSIS — Z170 Estrogen receptor positive status [ER+]: Secondary | ICD-10-CM | POA: Diagnosis not present

## 2022-04-24 DIAGNOSIS — C50912 Malignant neoplasm of unspecified site of left female breast: Secondary | ICD-10-CM

## 2022-04-24 NOTE — Progress Notes (Signed)
Virtual Visit via Telephone Note  I connected with Robin [name] on 04/24/22 at  4:00 PM EDT by telephone and verified that I am speaking with the correct person using two identifiers.  Location: [name]: At home Provider: In the office   I discussed the limitations, risks, security and privacy concerns of performing an evaluation and management service by telephone and the availability of in person appointments. I also discussed with the [name] that there may be a [name] responsible charge related to this service. The [name] expressed understanding and agreed to proceed.   History of Present Illness: [name] seen in our clinic for recurrent metastatic breast cancer to the bones and liver.  She has recently progressed on anastrozole and Ibrance.   Observations/Objective: I have called and talked to the [name]'s daughter Robin [name].  [name] is sitting by the side.  [name] reports stomach pain in the center of the abdomen and right upper quadrant since 2 to 3 weeks.  She was initially evaluated in the ER on 04/08/2022 and subsequently on 04/19/2022.  She is currently eating applesauce and Jell-O.  She was given Protonix and sucralfate in the ER.  She is able to drink boost without any problem.  Assessment and Plan:  1.  Metastatic breast cancer to the bones, liver, ER/PR positive: - At her last visit, I have recommended discontinuing anastrozole and Ibrance.  I have recommended starting on Enhertu as she had HER2 low disease with HER2 2+ on prior biopsy. - In the interim she went to the ER couple of times.  One of her daughters apparently called and canceled her appointment and she was placed in palliative care.  [name] came out of palliative care and want to have treatments done. - I have reviewed CTAP with contrast from recent ER visit on 04/19/2022.  There is high-grade stenosis of the proximal and mid superior mesenteric artery, high-grade stenosis of the renal artery origins bilaterally  and probable high-grade stenosis of the superior mesenteric artery and inferior mesenteric artery origins. - She is having abdominal pain and is unable to eat. - I think it is reasonable to further look into it with a CT arteriography. - Today we have discussed palliative care in the form of hospice versus active therapy.  [name] strongly expressed that she want to continue with treatment.  I have recommended that she come to our clinic next week.  I will physically evaluate her and if she is a candidate, we will proceed with Enhertu.   Follow Up Instructions: RTC next week to the clinic for possible treatment.   I discussed the assessment and treatment plan with the [name]. The [name] was provided an opportunity to ask questions and all were answered. The [name] agreed with the plan and demonstrated an understanding of the instructions.   The [name] was advised to call back or seek an in-person evaluation if the symptoms worsen or if the condition fails to improve as anticipated.  I provided 28 minutes of non-face-to-face time during this encounter.   Derek Jack, MD

## 2022-04-24 NOTE — Telephone Encounter (Signed)
Received call from daughter advising that patient cannot breath or hold her head up.  Was requesting oxygen be sent to the home. Explained to her that is not possible in an emergent situation without calling 911 and was advised to do so due to the circumstances.  Verbalized understanding and states that she will call.  Dr. Delton Coombes made aware.

## 2022-04-24 NOTE — Telephone Encounter (Signed)
     Patient  visit on 04/19/2022  at [facility] was for Abdominal Pain.  Have you been able to follow up with your primary care physician? Yes  The patient was or was not able to obtain any needed medicine or equipment. Patient obtained medication.  Are there diet recommendations that you are having difficulty following? No  Patient expresses understanding of discharge instructions and education provided has no other needs at this time. Yes   [facility] Resource Care Guide   ??[email or web address]  ?? [phone]   Website: [email or web address]  [email or web address]

## 2022-04-25 ENCOUNTER — Encounter: Payer: Self-pay | Admitting: Family Medicine

## 2022-04-25 ENCOUNTER — Telehealth (INDEPENDENT_AMBULATORY_CARE_PROVIDER_SITE_OTHER): Payer: Medicare PPO | Admitting: Family Medicine

## 2022-04-25 DIAGNOSIS — R0602 Shortness of breath: Secondary | ICD-10-CM

## 2022-04-25 DIAGNOSIS — Z743 Need for continuous supervision: Secondary | ICD-10-CM | POA: Diagnosis not present

## 2022-04-25 DIAGNOSIS — R0600 Dyspnea, unspecified: Secondary | ICD-10-CM | POA: Diagnosis not present

## 2022-04-25 NOTE — Progress Notes (Signed)
Virtual Visit via Video Note  I connected with Robin [name] on 04/25/22 at  4:00 PM EDT by a video enabled telemedicine application and verified that I am speaking with the correct person using two identifiers.  Patient Location: Home Provider Location: Office/Clinic  I discussed the limitations, risks, security, and privacy concerns of performing an evaluation and management service by video and the availability of in person appointments. I also discussed with the patient that there may be a patient responsible charge related to this service. The patient expressed understanding and agreed to proceed.  Subjective: PCP: Anabel Halon, MD  Chief Complaint  Patient presents with   Shortness of Breath    Minimal exacerbation, hunger has decreased, needs to discuss oxygen.    HPI The patient's daughter is the primary historian.  The patient's daughter is requesting oxygen for her mother, noting that she has shortness of breath with minimal exertion and decreased appetite.  No systematic symptoms were reported.  ROS: Per HPI  Current Outpatient Medications:    acetaminophen (TYLENOL) 325 MG tablet, Take 2 tablets (650 mg total) by mouth every 6 (six) hours as needed for mild pain, fever or headache (or Fever >/= 101). (Patient taking differently: Take 325 mg by mouth every 6 (six) hours as needed for mild pain, fever or headache (or Fever >/= 101).), Disp: 12 tablet, Rfl: 0   amLODipine (NORVASC) 10 MG tablet, TAKE 1 TABLET BY MOUTH EVERY DAY, Disp: 90 tablet, Rfl: 3   anastrozole (ARIMIDEX) 1 MG tablet, TAKE 1 TABLET BY MOUTH EVERY DAY, Disp: 30 tablet, Rfl: 3   carvedilol (COREG) 12.5 MG tablet, TAKE 1 TABLET (12.5MG  TOTAL) BY MOUTH TWICE A DAY WITH MEALS (Patient taking differently: Take 12.5 mg by mouth 2 (two) times daily with a meal.), Disp: 90 tablet, Rfl: 3   Cholecalciferol (VITAMIN D3) 125 MCG (5000 UT) CAPS, TAKE 1 CAPSULE BY MOUTH ONCE DAILY (Patient taking differently:  Take 5,000 Units by mouth daily.), Disp: 90 capsule, Rfl: 1   dicyclomine (BENTYL) 10 MG capsule, Take 1 capsule (10 mg total) by mouth 3 (three) times daily as needed for spasms., Disp: 20 capsule, Rfl: 1   dronabinol (MARINOL) 5 MG capsule, Take 1 capsule (5 mg total) by mouth 2 (two) times daily before a meal. (Patient taking differently: Take 5 mg by mouth daily as needed (for appetite).), Disp: 60 capsule, Rfl: 2   ELIQUIS 5 MG TABS tablet, TAKE 1 TABLET BY MOUTH TWICE A DAY, Disp: 60 tablet, Rfl: 3   escitalopram (LEXAPRO) 10 MG tablet, Take 1 tablet (10 mg total) by mouth daily., Disp: 30 tablet, Rfl: 3   Fam-Trastuzumab Deruxtec-nxki (ENHERTU IV), Inject into the vein every 21 ( twenty-one) days., Disp: , Rfl:    ferrous sulfate 325 (65 FE) MG tablet, Take 1 tablet (325 mg total) by mouth daily with breakfast., Disp: 30 tablet, Rfl: 0   fluticasone (FLONASE) 50 MCG/ACT nasal spray, Place 2 sprays into both nostrils daily. (Patient taking differently: Place 2 sprays into both nostrils daily as needed for allergies.), Disp: 16 g, Rfl: 1   furosemide (LASIX) 20 MG tablet, Take 1 tablet (20 mg total) by mouth daily as needed for fluid or edema., Disp: 30 tablet, Rfl: 1   gabapentin (NEURONTIN) 100 MG capsule, Take 1 capsule (100 mg total) by mouth at bedtime. (Patient taking differently: Take 100 mg by mouth at bedtime as needed (for pain).), Disp: 90 capsule, Rfl: 1   isosorbide  mononitrate (IMDUR) 30 MG 24 hr tablet, TAKE 1 TABLET BY MOUTH EVERY DAY, Disp: 90 tablet, Rfl: 2   KLOR-CON M10 10 MEQ tablet, TAKE 1 TABLET BY MOUTH EVERY DAY (Patient taking differently: 10 mEq daily.), Disp: 90 tablet, Rfl: 1   lidocaine-prilocaine (EMLA) cream, Apply a quarter-sized amount to port a cath site and cover with plastic wrap 1 hour prior to infusion appointments, Disp: 30 g, Rfl: 3   Misc. Devices MISC, Please provide with left upper extremity lymphedema sleeve, Disp: 1 Units, Rfl: 0   nitroGLYCERIN  (NITROSTAT) 0.4 MG SL tablet, Place 1 tablet (0.4 mg total) under the tongue every 5 (five) minutes x 3 doses as needed., Disp: 25 tablet, Rfl: 3   Omega-3 Fatty Acids 300 MG CAPS, Take 1 capsule (300 mg total) by mouth 2 (two) times daily. (Patient taking differently: Take 1 capsule by mouth daily.), Disp: , Rfl:    pantoprazole (PROTONIX) 20 MG tablet, Take 1 tablet (20 mg total) by mouth daily for 14 days., Disp: 14 tablet, Rfl: 0   prochlorperazine (COMPAZINE) 5 MG tablet, Take 1 tablet (5 mg total) by mouth every 6 (six) hours as needed for nausea or vomiting., Disp: 30 tablet, Rfl: 3   sucralfate (CARAFATE) 1 g tablet, Take 1 tablet (1 g total) by mouth with breakfast, with lunch, and with evening meal for 7 days., Disp: 21 tablet, Rfl: 0   SYNTHROID 125 MCG tablet, Take 150 mcg by mouth daily., Disp: , Rfl:    traMADol (ULTRAM) 50 MG tablet, Take 1 tablet (50 mg total) by mouth every 8 (eight) hours as needed., Disp: 90 tablet, Rfl: 0  Observations/Objective: There were no vitals filed for this visit. Physical Exam The patient is resting in her recliner asleep Assessment and Plan: SOB (shortness of breath)   Inform the patient's daughter  that the patient will need to be evaluated in the clinic before starting oxygen therapy Will make an  appointment with the patient to see PCP in a week for O2 evaluation  Follow Up Instructions: No follow-ups on file.   I discussed the assessment and treatment plan with the patient. The patient was provided an opportunity to ask questions, and all were answered. The patient agreed with the plan and demonstrated an understanding of the instructions.   The patient was advised to call back or seek an in-person evaluation if the symptoms worsen or if the condition fails to improve as anticipated.  The above assessment and management plan was discussed with the patient. The patient verbalized understanding of and has agreed to the management plan.    Gilmore Laroche, FNP

## 2022-04-26 DIAGNOSIS — I5181 Takotsubo syndrome: Secondary | ICD-10-CM | POA: Diagnosis not present

## 2022-04-26 DIAGNOSIS — J90 Pleural effusion, not elsewhere classified: Secondary | ICD-10-CM | POA: Diagnosis not present

## 2022-04-26 DIAGNOSIS — I10 Essential (primary) hypertension: Secondary | ICD-10-CM | POA: Diagnosis not present

## 2022-04-26 DIAGNOSIS — J189 Pneumonia, unspecified organism: Secondary | ICD-10-CM | POA: Diagnosis not present

## 2022-04-26 DIAGNOSIS — R7989 Other specified abnormal findings of blood chemistry: Secondary | ICD-10-CM | POA: Diagnosis not present

## 2022-04-26 DIAGNOSIS — R0602 Shortness of breath: Secondary | ICD-10-CM | POA: Diagnosis not present

## 2022-04-26 DIAGNOSIS — I21A1 Myocardial infarction type 2: Secondary | ICD-10-CM | POA: Diagnosis not present

## 2022-04-26 DIAGNOSIS — A419 Sepsis, unspecified organism: Secondary | ICD-10-CM | POA: Diagnosis not present

## 2022-04-26 DIAGNOSIS — E782 Mixed hyperlipidemia: Secondary | ICD-10-CM | POA: Diagnosis not present

## 2022-04-26 DIAGNOSIS — N179 Acute kidney failure, unspecified: Secondary | ICD-10-CM | POA: Diagnosis not present

## 2022-04-26 DIAGNOSIS — C7951 Secondary malignant neoplasm of bone: Secondary | ICD-10-CM | POA: Diagnosis not present

## 2022-04-26 DIAGNOSIS — Z66 Do not resuscitate: Secondary | ICD-10-CM | POA: Diagnosis not present

## 2022-04-26 DIAGNOSIS — Z515 Encounter for palliative care: Secondary | ICD-10-CM | POA: Diagnosis not present

## 2022-04-26 DIAGNOSIS — I2510 Atherosclerotic heart disease of native coronary artery without angina pectoris: Secondary | ICD-10-CM | POA: Diagnosis not present

## 2022-04-28 ENCOUNTER — Other Ambulatory Visit: Payer: Self-pay | Admitting: Internal Medicine

## 2022-04-28 DIAGNOSIS — I429 Cardiomyopathy, unspecified: Secondary | ICD-10-CM

## 2022-04-28 DIAGNOSIS — I10 Essential (primary) hypertension: Secondary | ICD-10-CM

## 2022-04-28 NOTE — Progress Notes (Signed)
Pharmacist Chemotherapy Monitoring - Initial Assessment    Anticipated start date: 04/30/22   The following has been reviewed per standard work regarding the patient's treatment regimen: The patient's diagnosis, treatment plan and drug doses, and organ/hematologic function Lab orders and baseline tests specific to treatment regimen  The treatment plan start date, drug sequencing, and pre-medications Prior authorization status  Patient's documented medication list, including drug-drug interaction screen and prescriptions for anti-emetics and supportive care specific to the treatment regimen The drug concentrations, fluid compatibility, administration routes, and timing of the medications to be used The patient's access for treatment and lifetime cumulative dose history, if applicable  The patient's medication allergies and previous infusion related reactions, if applicable   Changes made to treatment plan:  treatment plan date  Follow up needed:  N/A  ECHO initial from 12/01/21 - 55-60%   Stephens Shire, RPH, 04/28/2022  1:58 PM

## 2022-04-29 ENCOUNTER — Ambulatory Visit: Payer: Medicare PPO | Admitting: Internal Medicine

## 2022-04-30 ENCOUNTER — Inpatient Hospital Stay: Payer: Medicare PPO

## 2022-04-30 ENCOUNTER — Ambulatory Visit: Payer: Medicare PPO | Admitting: Physician Assistant

## 2022-04-30 ENCOUNTER — Ambulatory Visit: Payer: Medicare PPO

## 2022-04-30 ENCOUNTER — Inpatient Hospital Stay: Payer: Medicare PPO | Admitting: Hematology

## 2022-04-30 ENCOUNTER — Other Ambulatory Visit: Payer: Medicare PPO

## 2022-05-01 ENCOUNTER — Other Ambulatory Visit: Payer: Medicare PPO

## 2022-05-01 ENCOUNTER — Ambulatory Visit: Payer: Medicare PPO | Admitting: Hematology

## 2022-05-01 ENCOUNTER — Ambulatory Visit: Payer: Medicare PPO

## 2022-05-01 ENCOUNTER — Telehealth: Payer: Medicare PPO | Admitting: Licensed Clinical Social Worker

## 2022-05-06 ENCOUNTER — Telehealth: Payer: Self-pay | Admitting: Internal Medicine

## 2022-05-06 NOTE — Telephone Encounter (Signed)
Marily Memos called from [facility]  about a referral asked if Dr Allena Katz will follow up and agree to sign orders? Call [place] back at [phone].

## 2022-05-07 NOTE — Telephone Encounter (Signed)
[place] advised

## 2022-05-13 ENCOUNTER — Ambulatory Visit: Payer: Medicare PPO | Admitting: Nurse Practitioner

## 2022-05-15 ENCOUNTER — Other Ambulatory Visit: Payer: Medicare PPO

## 2022-05-15 ENCOUNTER — Ambulatory Visit: Payer: Medicare PPO

## 2022-05-15 ENCOUNTER — Ambulatory Visit: Payer: Medicare PPO | Admitting: Hematology

## 2022-05-20 ENCOUNTER — Ambulatory Visit: Payer: Medicare PPO | Admitting: Cardiology

## 2022-05-21 DEATH — deceased

## 2022-05-22 ENCOUNTER — Ambulatory Visit: Payer: Medicare PPO

## 2022-05-22 ENCOUNTER — Other Ambulatory Visit: Payer: Medicare PPO

## 2022-05-22 ENCOUNTER — Ambulatory Visit: Payer: Medicare PPO | Admitting: Hematology

## 2022-06-12 ENCOUNTER — Other Ambulatory Visit: Payer: Medicare PPO

## 2022-06-12 ENCOUNTER — Ambulatory Visit: Payer: Medicare PPO | Admitting: Hematology

## 2022-06-12 ENCOUNTER — Ambulatory Visit: Payer: Medicare PPO

## 2022-07-03 ENCOUNTER — Encounter: Payer: Medicare PPO | Admitting: Internal Medicine

## 2022-08-14 NOTE — Progress Notes (Signed)
Request received from [facility] requested office visit notes regarding NGS testing request. Office visit note from 02/24/22 printed and faxed to [employer] as [phone]

## 2023-05-22 ENCOUNTER — Other Ambulatory Visit (HOSPITAL_COMMUNITY): Payer: Self-pay
